# Patient Record
Sex: Male | Born: 1955 | Race: White | Hispanic: No | Marital: Married | Smoking: Never smoker
Health system: Southern US, Community
[De-identification: ages and names within clinical notes are randomized; demographics above are authoritative.]

## PROBLEM LIST (undated history)

## (undated) DIAGNOSIS — F419 Anxiety disorder, unspecified: Secondary | ICD-10-CM

## (undated) DIAGNOSIS — E7800 Pure hypercholesterolemia, unspecified: Secondary | ICD-10-CM

## (undated) DIAGNOSIS — I639 Cerebral infarction, unspecified: Secondary | ICD-10-CM

## (undated) DIAGNOSIS — G459 Transient cerebral ischemic attack, unspecified: Secondary | ICD-10-CM

---

## 1997-10-02 HISTORY — PX: VASECTOMY: SHX75

## 2007-01-23 ENCOUNTER — Ambulatory Visit: Payer: Self-pay | Admitting: Family Medicine

## 2008-01-01 ENCOUNTER — Ambulatory Visit: Payer: Self-pay | Admitting: Family Medicine

## 2008-01-01 DIAGNOSIS — J157 Pneumonia due to Mycoplasma pneumoniae: Secondary | ICD-10-CM

## 2008-01-01 DIAGNOSIS — E785 Hyperlipidemia, unspecified: Secondary | ICD-10-CM | POA: Insufficient documentation

## 2008-10-05 ENCOUNTER — Telehealth: Payer: Self-pay | Admitting: Family Medicine

## 2008-10-05 ENCOUNTER — Encounter: Payer: Self-pay | Admitting: Family Medicine

## 2008-10-05 DIAGNOSIS — R209 Unspecified disturbances of skin sensation: Secondary | ICD-10-CM

## 2008-10-06 ENCOUNTER — Ambulatory Visit: Payer: Self-pay | Admitting: Family Medicine

## 2008-10-07 ENCOUNTER — Inpatient Hospital Stay (HOSPITAL_COMMUNITY): Admission: AD | Admit: 2008-10-07 | Discharge: 2008-10-08 | Payer: Self-pay | Admitting: Neurology

## 2008-10-07 ENCOUNTER — Encounter (INDEPENDENT_AMBULATORY_CARE_PROVIDER_SITE_OTHER): Payer: Self-pay | Admitting: Neurology

## 2008-10-07 ENCOUNTER — Telehealth: Payer: Self-pay | Admitting: *Deleted

## 2008-10-07 ENCOUNTER — Telehealth: Payer: Self-pay | Admitting: Family Medicine

## 2008-10-29 ENCOUNTER — Ambulatory Visit: Payer: Self-pay | Admitting: Family Medicine

## 2008-10-29 LAB — CONVERTED CEMR LAB
ALT: 30 units/L (ref 0–53)
Albumin: 3.9 g/dL (ref 3.5–5.2)
Basophils Absolute: 0 10*3/uL (ref 0.0–0.1)
Basophils Relative: 0.2 % (ref 0.0–3.0)
Bilirubin Urine: NEGATIVE
Bilirubin, Direct: 0.1 mg/dL (ref 0.0–0.3)
Blood in Urine, dipstick: NEGATIVE
CRP, High Sensitivity: <1 — ABNORMAL LOW (ref 0.00–5.00)
Calcium: 8.9 mg/dL (ref 8.4–10.5)
Cholesterol: 154 mg/dL (ref 0–200)
Eosinophils Absolute: 0.1 10*3/uL (ref 0.0–0.7)
GFR calc non Af Amer: 84 mL/min
Glucose, Urine, Semiquant: NEGATIVE
HDL: 40.2 mg/dL (ref >39.0)
Ketones, urine, test strip: NEGATIVE
LDL Cholesterol: 92 mg/dL (ref 0–99)
MCHC: 34.2 g/dL (ref 30.0–36.0)
Monocytes Relative: 10.6 % (ref 3.0–12.0)
Neutro Abs: 3.7 10*3/uL (ref 1.4–7.7)
Neutrophils Relative %: 64.6 % (ref 43.0–77.0)
PSA: 0.6 ng/mL (ref 0.10–4.00)
Platelets: 196 10*3/uL (ref 150–400)
Protein, U semiquant: NEGATIVE
RBC: 4.35 M/uL (ref 4.22–5.81)
RDW: 12.5 % (ref 11.5–14.6)
Sodium: 140 meq/L (ref 135–145)
TSH: 1.52 microintl units/mL (ref 0.35–5.50)
Total Bilirubin: 0.7 mg/dL (ref 0.3–1.2)
Total Protein: 7 g/dL (ref 6.0–8.3)
Urobilinogen, UA: 0.2
VLDL: 22 mg/dL (ref 0–40)
WBC Urine, dipstick: NEGATIVE

## 2008-10-30 ENCOUNTER — Telehealth: Payer: Self-pay | Admitting: *Deleted

## 2008-11-09 ENCOUNTER — Ambulatory Visit: Payer: Self-pay | Admitting: Family Medicine

## 2008-11-19 ENCOUNTER — Ambulatory Visit: Payer: Self-pay | Admitting: Gastroenterology

## 2008-12-01 ENCOUNTER — Ambulatory Visit: Payer: Self-pay | Admitting: Gastroenterology

## 2009-05-31 ENCOUNTER — Telehealth: Payer: Self-pay | Admitting: Family Medicine

## 2009-08-05 ENCOUNTER — Ambulatory Visit: Payer: Self-pay | Admitting: Family Medicine

## 2009-08-06 ENCOUNTER — Telehealth: Payer: Self-pay | Admitting: Family Medicine

## 2009-08-06 ENCOUNTER — Ambulatory Visit: Payer: Self-pay | Admitting: Internal Medicine

## 2010-03-03 ENCOUNTER — Telehealth: Payer: Self-pay | Admitting: Family Medicine

## 2010-03-28 ENCOUNTER — Ambulatory Visit: Payer: Self-pay | Admitting: Family Medicine

## 2010-03-28 LAB — CONVERTED CEMR LAB
Alkaline Phosphatase: 40 units/L (ref 39–117)
Basophils Absolute: 0 10*3/uL (ref 0.0–0.1)
Basophils Relative: 0.5 % (ref 0.0–3.0)
Bilirubin Urine: NEGATIVE
CO2: 30 meq/L (ref 19–32)
Calcium: 8.7 mg/dL (ref 8.4–10.5)
Chloride: 106 meq/L (ref 96–112)
HCT: 41.1 % (ref 39.0–52.0)
Hemoglobin, Urine: NEGATIVE
Hemoglobin: 14.1 g/dL (ref 13.0–17.0)
MCV: 93.4 fL (ref 78.0–100.0)
Monocytes Absolute: 0.6 10*3/uL (ref 0.1–1.0)
Neutro Abs: 3.6 10*3/uL (ref 1.4–7.7)
Nitrite: NEGATIVE
PSA: 0.44 ng/mL (ref 0.10–4.00)
RBC: 4.39 M/uL (ref 4.22–5.81)
Sodium: 141 meq/L (ref 135–145)
TSH: 2.11 microintl units/mL (ref 0.35–5.50)
Total Bilirubin: 1 mg/dL (ref 0.3–1.2)
Total Protein: 6.6 g/dL (ref 6.0–8.3)
Triglycerides: 104 mg/dL (ref 0.0–149.0)
Urine Glucose: NEGATIVE mg/dL
Urobilinogen, UA: 0.2 (ref 0.0–1.0)
VLDL: 20.8 mg/dL (ref 0.0–40.0)
pH: 6 (ref 5.0–8.0)

## 2010-04-04 ENCOUNTER — Ambulatory Visit: Payer: Self-pay | Admitting: Family Medicine

## 2010-11-13 HISTORY — PX: OTHER SURGICAL HISTORY: SHX169

## 2010-12-13 NOTE — Assessment & Plan Note (Signed)
Summary: cpx//ccm/pt rsc/cjr   Vital Signs:  Patient profile:   55 year old male Height:      70.5 inches Weight:      204 pounds BMI:     28.96 BP sitting:   130 / 88  (left arm) Cuff size:   regular  Vitals Entered By: Kern Reap CMA Duncan Dull) (Apr 04, 2010 2:02 PM) CC: cpx Is Patient Diabetic? No Pain Assessment Patient in pain? no        CC:  cpx.  History of Present Illness: Charles Marquez is a 55 year old, married male, nonsmoker, who comes in today for physical evaluation because of underlying hyperlipidemia.  He takes Zocor 80 mg nightly lipids are dull L. states one aspirin tablet.  He takes Zyrtec p.r.n. for allergic rhinitis.  Review of systems negative.  He continues to remain very physically active.  He frefs , basketball and baseball games.  His oldest daughter [name] is getting married .  Allergies: No Known Drug Allergies  Past History:  Past medical, surgical, family and social histories (including risk factors) reviewed, and no changes noted (except as noted below).  Past Medical History: Reviewed history from 01/01/2008 and no changes required. Hyperlipidemia  Past Surgical History: Reviewed history from 01/01/2008 and no changes required. Inguinal herniorrhaphy-94' Vasectomy-96'  Family History: Reviewed history and no changes required.  Social History: Reviewed history from 01/01/2008 and no changes required. Occupation: [employer] Married Never Smoked Alcohol use-no Drug use-no Regular exercise-yes  Review of Systems      See HPI  Physical Exam  General:  Well-developed,well-nourished,in no acute distress; alert,appropriate and cooperative throughout examination Head:  Normocephalic and atraumatic without obvious abnormalities. No apparent alopecia or balding. Eyes:  No corneal or conjunctival inflammation noted. EOMI. Perrla. Funduscopic exam benign, without hemorrhages, exudates or papilledema. Vision grossly normal. Ears:  External ear  exam shows no significant lesions or deformities.  Otoscopic examination reveals clear canals, tympanic membranes are intact bilaterally without bulging, retraction, inflammation or discharge. Hearing is grossly normal bilaterally. Nose:  External nasal examination shows no deformity or inflammation. Nasal mucosa are pink and moist without lesions or exudates. Mouth:  Oral mucosa and oropharynx without lesions or exudates.  Teeth in good repair. Neck:  No deformities, masses, or tenderness noted. Chest Wall:  No deformities, masses, tenderness or gynecomastia noted. Breasts:  No masses or gynecomastia noted Lungs:  Normal respiratory effort, chest expands symmetrically. Lungs are clear to auscultation, no crackles or wheezes. Heart:  Normal rate and regular rhythm. S1 and S2 normal without gallop, murmur, click, rub or other extra sounds. Abdomen:  Bowel sounds positive,abdomen soft and non-tender without masses, organomegaly or hernias noted. Rectal:  No external abnormalities noted. Normal sphincter tone. No rectal masses or tenderness. Genitalia:  Testes bilaterally descended without nodularity, tenderness or masses. No scrotal masses or lesions. No penis lesions or urethral discharge. Prostate:  Prostate gland firm and smooth, no enlargement, nodularity, tenderness, mass, asymmetry or induration. Msk:  No deformity or scoliosis noted of thoracic or lumbar spine.   Pulses:  R and L carotid,radial,femoral,dorsalis pedis and posterior tibial pulses are full and equal bilaterally Extremities:  No clubbing, cyanosis, edema, or deformity noted with normal full range of motion of all joints.   Neurologic:  No cranial nerve deficits noted. Station and gait are normal. Plantar reflexes are down-going bilaterally. DTRs are symmetrical throughout. Sensory, motor and coordinative functions appear intact. Skin:  Intact without suspicious lesions or rashes Cervical Nodes:  No lymphadenopathy noted Axillary  Nodes:  No palpable lymphadenopathy Inguinal Nodes:  No significant adenopathy Psych:  Cognition and judgment appear intact. Alert and cooperative with normal attention span and concentration. No apparent delusions, illusions, hallucinations   Impression & Recommendations:  Problem # 1:  HYPERLIPIDEMIA (ICD-272.4) Assessment Unchanged  His updated medication list for this problem includes:    Zocor 80 Mg Tabs (Simvastatin) .[name]... 1 tab @ bedtime  Orders: Prescription Created Electronically [phone]) EKG w/ Interpretation (93000)  Problem # 2:  Preventive Health Care (ICD-V70.0) Assessment: Unchanged  Complete Medication List: 1)  Zyrtec Allergy 10 Mg Tabs (Cetirizine hcl) .... Otc as needed 2)  Zocor 80 Mg Tabs (Simvastatin) .[name].. 1 tab @ bedtime 3)  Aspirin 325 Mg Tabs (Aspirin) .... Once daily  Other Orders: Tdap => 43yrs IM (02725) Admin 1st Vaccine (36644)  Patient Instructions: 1)  Please schedule a follow-up appointment in 1 year. Prescriptions: ZOCOR 80 MG TABS (SIMVASTATIN) 1 tab @ bedtime  #100 x 3   Entered and Authorized by:   Roderick Pee MD   Signed by:   Roderick Pee MD on 04/04/2010   Method used:   [facility]  [place] [address] [facility]       [address]       [place], [place]  [address]       Ph: [phone] or [phone]       Fax: [phone]   RxID:   [id number]    Immunizations Administered:  Tetanus Vaccine:    Vaccine Type: Tdap    Site: left deltoid    Mfr: GlaxoSmithKline    Dose: 0.5 ml    Route: IM    Given by: Kern Reap CMA (AAMA)    Exp. Date: 02/05/2012    Lot #: [id number]    Physician counseled: yes

## 2010-12-13 NOTE — Progress Notes (Signed)
Summary: refill  Phone Note Call from Patient Call back at Home Phone [phone]   Caller: Patient---live call Reason for Call: Refill Medication Summary of Call: Cpx is on 03-24-2010. [facility] Initial call taken by: Warnell Forester,  March 03, 2010 9:07 AM    Prescriptions: ZOCOR 80 MG TABS (SIMVASTATIN) 1 tab @ bedtime  #90 x 0   Entered by:   Kern Reap CMA (AAMA)   Authorized by:   Roderick Pee MD   Signed by:   Kern Reap CMA (AAMA) on 03/03/2010   Method used:   [facility]  [place] [address]       [address]       [place], [place]  [address]       Ph: [phone] or [phone]       Fax: [phone]   RxID:   [id number]

## 2010-12-13 NOTE — Progress Notes (Signed)
Summary: pain worse CT or MRI?  Phone Note Call from Patient Call back at [phone]   Caller: live Call For: burchette Summary of Call: During evening & night pain got much worse.  Using the Ibuprofen.  You mentioned CT or MRI.  Calling to get this ordered. Initial call taken by: Rudy Jew, RN,  August 06, 2009 8:51 AM  Follow-up for Phone Call        Spoke with patient.  Pain progressive and kept him awake all night. Will set up abdominal CT study. Follow-up by: Evelena Peat MD,  August 06, 2009 9:43 AM  Additional Follow-up for Phone Call Additional follow up Details #1::        Appt Scheduled Pt aware Additional Follow-up by: Corky Mull,  August 06, 2009 5:04 PM    `

## 2011-01-12 HISTORY — PX: HERNIA REPAIR: SHX51

## 2011-03-28 NOTE — H&P (Signed)
NAMEDEJAY, KRONK NO.:  [id number]   MEDICAL RECORD NO.:  [id number]          PATIENT TYPE:  INP   LOCATION:  3034                         FACILITY:  [facility]   PHYSICIAN:  Melvyn Novas, M.D.  DATE OF BIRTH:  1956-10-10   DATE OF ADMISSION:  10/07/2008  DATE OF DISCHARGE:                              HISTORY & PHYSICAL   [name] is a patient of Dr. Tawanna Cooler at the [facility].  On Monday, October 05, 2008, he had a spell that was  reminiscent of a TIA.  Dr. Tawanna Cooler called my office, and we admitted the  patient directly to [facility] to allow a TIA workup prior to the  Thanksgiving holiday upcoming.   The patient states in his history of illness that he was feeling fine on  Monday at work.  By the time he ate lunch, he suddenly while eating  discovered that his left face was numb.  He states that he felt like his  left side of the tongue seemed to have gotten a shot of Novocain, then  he also realized as he walked about his office that his left fingers  were numbish.  He did not notice any other truncal numbness and the  lower extremities were not involved.  There was no weakness associated and no pain.  It took about 30 minutes  on to complete resolution of the symptoms.  By that time, the patient  drove himself to urgent care.  A CT unenhanced of the head was ordered  outpatient at [facility] and upon the results being normal,  he called Dr. Tawanna Cooler who then arranged for the patient to be seen by the  stroke service.   PAST MEDICAL HISTORY:  The patient received in the past Lipitor for  elevated cholesterol but has meanwhile stopped taking it.  He was also  advised to take aspirin but has irregularly taken it.  He was given  Avelox after the CT scan of the head was obtained for upper airway  symptoms.  He had a history of dry cough, nonproductive and some dull  headaches; however, there was no evidence of sinusitis on the CT  scan  and the Avelox was discontinued by Dr. Tawanna Cooler.  The patient has no history of hypertension or diabetes mellitus, no  history of seizures, previous strokes, or traumatic brain injury.   CURRENT MEDICATIONS:  Only baby aspirin, which he has taken irregularly.  No nutritional supplements.  He had once tried niacin but is no longer  on it, and he states he was just started again on simvastatin by Dr.  Tawanna Cooler, has not yet filled this prescription.   He has no known drug allergies.   Family history is negative for coronary artery disease, hypertension,  diabetes, stroke, and dementia.  His father died of lung cancer but was  a heavy smoker.  His mother is 67, generally in good health but has a  mild-to-moderate degree of COPD and is also an ex-smoker.   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  He is married, with two adult children, age 41 at 80.  The  older daughter is just visiting from [place] tomorrow for the  Thanksgiving holiday.   Vital signs are stable.  The patient is febrile.  Blood pressure has  been 117/80, heart rate is between 75 and 83, and respiratory rate is  about 18.  At rest lungs are clear to auscultation.  No wheezing.  No  rhonchi.  The patient oxygenated on room air 95%.  His kilogram weight  is 92.  His oral intake and his output have been balanced per the  nursing note.  Mental status is alert and oriented x3, he has fluent and full phase  eloquent, no trouble with swallowing, passed the bedside swallow test  and the stroke panel already.  Cranial nerve examination shows no  residual facial numbness, no tongue or uvula deviation, and no  disconjugate gait.  The patient has no pupillary or facial asymmetry.  Motor examination shows equal tone, mass, and strength.  There is no pronator drift.  Symmetric reflexes are seen with downgoing  toes to  plantar stimulation.  Finger-nose-finger test is intact.  Gait is intact.  Sensory  subjectively as reported by the patient.  No residual numbness.  There is no extinction.   ASSESSMENT:  I believe that this patient has likely a transient ischemic  attack of 20-30 minutes duration with a right brain origin given the  left-sided symptoms.  This could well be located to the internal  capsule.  I am expecting a rather small area to be involved and since symptoms  have resolved a MRI may be negative.  There has been no general change  in his personality or level of alertness and no bulbar symptoms,  strictly sensory symptoms.    The patient was admitted for observation.  An MRI, 2-D echo, and  carotid Dopplers are ordered.  We will also draw and hypercoagulability  panel and a fasting lipid panel on October 08, 2008, in a.m. since the  patient had already eaten prior to his observational stay admission and  he will be taking aspirin 325 mg for the next 6 weeks then may return to  a baby aspirin if taken daily.   Followup can be arranged with Dr. Tawanna Cooler should any of the tests return  abnormal.  A followup will be arranged with Dr. Delia Heady.      Melvyn Novas, M.D.  Electronically Signed     CD/MEDQ  D:  10/08/2008  T:  10/08/2008  Job:  [id number]   cc:   Tinnie Gens A. Tawanna Cooler, MD  Pramod P. Pearlean Brownie, MD

## 2011-06-28 ENCOUNTER — Other Ambulatory Visit: Payer: Self-pay | Admitting: Family Medicine

## 2011-08-15 LAB — CBC
MCHC: 34 g/dL (ref 30.0–36.0)
Platelets: 219 10*3/uL (ref 150–400)

## 2011-08-15 LAB — COMPREHENSIVE METABOLIC PANEL
AST: 25 U/L (ref 0–37)
Albumin: 3.8 g/dL (ref 3.5–5.2)
BUN: 10 mg/dL (ref 6–23)
CO2: 26 mEq/L (ref 19–32)
GFR calc non Af Amer: >60 mL/min (ref >60)
Glucose, Bld: 97 mg/dL (ref 70–99)
Potassium: 3.8 mEq/L (ref 3.5–5.1)
Sodium: 139 mEq/L (ref 135–145)
Total Protein: 6.6 g/dL (ref 6.0–8.3)

## 2011-08-15 LAB — CARDIOLIPIN ANTIBODIES, IGG, IGM, IGA: Anticardiolipin IgG: <7 [GPL'U] — ABNORMAL LOW (ref <11)

## 2011-08-15 LAB — URINALYSIS, ROUTINE W REFLEX MICROSCOPIC
Bilirubin Urine: NEGATIVE
Glucose, UA: NEGATIVE mg/dL
Protein, ur: NEGATIVE mg/dL
Specific Gravity, Urine: 1.011 (ref 1.005–1.030)
Urobilinogen, UA: 0.2 mg/dL (ref 0.0–1.0)
pH: 7 (ref 5.0–8.0)

## 2011-08-15 LAB — LUPUS ANTICOAGULANT PANEL: PTT Lupus Anticoagulant: 43 secs (ref 36.3–48.8)

## 2011-08-15 LAB — URINE MICROSCOPIC-ADD ON

## 2011-08-15 LAB — FACTOR 5 LEIDEN

## 2011-08-15 LAB — HOMOCYSTEINE: Homocysteine: 9 umol/L (ref 4.0–15.4)

## 2011-08-15 LAB — PROTEIN S ACTIVITY: Protein S Activity: 128 % (ref 69–129)

## 2011-08-15 LAB — PROTHROMBIN GENE MUTATION

## 2011-08-15 LAB — HEMOGLOBIN A1C
Hgb A1c MFr Bld: 5.6 % (ref 4.6–6.1)
Mean Plasma Glucose: 114 mg/dL

## 2011-08-15 LAB — LIPID PANEL
Cholesterol: 216 mg/dL — ABNORMAL HIGH (ref 0–200)
VLDL: 72 mg/dL — ABNORMAL HIGH (ref 0–40)

## 2011-08-15 LAB — GLUCOSE, CAPILLARY: Glucose-Capillary: 146 mg/dL — ABNORMAL HIGH (ref 70–99)

## 2011-08-15 LAB — PROTEIN C ACTIVITY: Protein C Activity: 118 % (ref 75–133)

## 2011-08-15 LAB — ANTITHROMBIN III: AntiThromb III Func: 111 % (ref 76–126)

## 2012-04-08 ENCOUNTER — Other Ambulatory Visit: Payer: Self-pay | Admitting: Family Medicine

## 2012-06-09 ENCOUNTER — Other Ambulatory Visit: Payer: Self-pay | Admitting: Family Medicine

## 2012-07-12 ENCOUNTER — Other Ambulatory Visit: Payer: Self-pay | Admitting: Family Medicine

## 2012-07-19 ENCOUNTER — Telehealth: Payer: Self-pay | Admitting: Family Medicine

## 2012-07-19 MED ORDER — SIMVASTATIN 80 MG PO TABS: 80.0000 mg | ORAL_TABLET | Freq: Every day | ORAL | Status: DC

## 2012-07-19 NOTE — Telephone Encounter (Signed)
Pt has sch ov for 07/23/12 at 9:15am for med check ov with Dr Tawanna Cooler. Pt req partial refill of simvastatin (ZOCOR) 80 MG tablet to last until appt date. [facility] [place]

## 2012-07-23 ENCOUNTER — Ambulatory Visit (INDEPENDENT_AMBULATORY_CARE_PROVIDER_SITE_OTHER): Payer: Self-pay | Admitting: Family Medicine

## 2012-07-23 ENCOUNTER — Encounter: Payer: Self-pay | Admitting: Family Medicine

## 2012-07-23 VITALS — BP 120/88 | Temp 98.4°F | Ht 71.0 in | Wt 209.0 lb

## 2012-07-23 DIAGNOSIS — Z0000 Encounter for general adult medical examination without abnormal findings: Secondary | ICD-10-CM | POA: Insufficient documentation

## 2012-07-23 DIAGNOSIS — Z79899 Other long term (current) drug therapy: Secondary | ICD-10-CM

## 2012-07-23 DIAGNOSIS — E785 Hyperlipidemia, unspecified: Secondary | ICD-10-CM

## 2012-07-23 DIAGNOSIS — Z23 Encounter for immunization: Secondary | ICD-10-CM

## 2012-07-23 LAB — BASIC METABOLIC PANEL
BUN: 12 mg/dL (ref 6–23)
Calcium: 9 mg/dL (ref 8.4–10.5)
GFR: 98.29 mL/min (ref >60.00)
Potassium: 4.2 mEq/L (ref 3.5–5.1)

## 2012-07-23 LAB — POCT URINALYSIS DIPSTICK
Bilirubin, UA: NEGATIVE
Glucose, UA: NEGATIVE
Leukocytes, UA: NEGATIVE
Nitrite, UA: NEGATIVE
Urobilinogen, UA: 0.2
pH, UA: 7

## 2012-07-23 LAB — CBC WITH DIFFERENTIAL/PLATELET
Basophils Absolute: 0 10*3/uL (ref 0.0–0.1)
Eosinophils Absolute: 0.1 10*3/uL (ref 0.0–0.7)
Lymphocytes Relative: 21 % (ref 12.0–46.0)
Lymphs Abs: 1.2 10*3/uL (ref 0.7–4.0)
Monocytes Relative: 11.2 % (ref 3.0–12.0)
Platelets: 201 10*3/uL (ref 150.0–400.0)
RDW: 13 % (ref 11.5–14.6)

## 2012-07-23 LAB — PSA: PSA: 1.02 ng/mL (ref 0.10–4.00)

## 2012-07-23 LAB — HEPATIC FUNCTION PANEL
ALT: 23 U/L (ref 0–53)
AST: 25 U/L (ref 0–37)
Albumin: 4.1 g/dL (ref 3.5–5.2)

## 2012-07-23 LAB — LIPID PANEL
Cholesterol: 207 mg/dL — ABNORMAL HIGH (ref 0–200)
Triglycerides: 241 mg/dL — ABNORMAL HIGH (ref 0.0–149.0)

## 2012-07-23 LAB — LDL CHOLESTEROL, DIRECT: Direct LDL: 106.2 mg/dL

## 2012-07-23 MED ORDER — SIMVASTATIN 80 MG PO TABS: 80.0000 mg | ORAL_TABLET | Freq: Every day | ORAL | Status: DC

## 2012-07-23 NOTE — Patient Instructions (Signed)
Continue good health habits  Take the Zocor and aspirin daily  Return in one year for general physical examination,,,,,,,,,,, sooner if any problems

## 2012-07-23 NOTE — Progress Notes (Signed)
  Subjective:    Patient ID: Charles Marquez, male    DOB: 07/06/1956, 56 y.o.   MRN: [id number]  HPI Charles Marquez is a 56 year old married male nonsmoker who comes in today for general physical examination  He takes an aspirin tablet +80 mg of Zocor daily for hyperlipidemia will check lipid panel  He gets routine eye care, dental care, colonoscopy 2010 normal, tetanus 2011   Review of Systems  Constitutional: Negative.   HENT: Negative.   Eyes: Negative.   Respiratory: Negative.   Cardiovascular: Negative.   Gastrointestinal: Negative.   Genitourinary: Negative.   Musculoskeletal: Negative.   Skin: Negative.   Neurological: Negative.   Hematological: Negative.   Psychiatric/Behavioral: Negative.        Objective:   Physical Exam  Constitutional: He is oriented to person, place, and time. He appears well-developed and well-nourished.  HENT:  Head: Normocephalic and atraumatic.  Right Ear: External ear normal.  Left Ear: External ear normal.  Nose: Nose normal.  Mouth/Throat: Oropharynx is clear and moist.  Eyes: Conjunctivae and EOM are normal. Pupils are equal, round, and reactive to light.  Neck: Normal range of motion. Neck supple. No JVD present. No tracheal deviation present. No thyromegaly present.  Cardiovascular: Normal rate, regular rhythm, normal heart sounds and intact distal pulses.  Exam reveals no gallop and no friction rub.   No murmur heard. Pulmonary/Chest: Effort normal and breath sounds normal. No stridor. No respiratory distress. He has no wheezes. He has no rales. He exhibits no tenderness.  Abdominal: Soft. Bowel sounds are normal. He exhibits no distension and no mass. There is no tenderness. There is no rebound and no guarding.  Genitourinary: Rectum normal, prostate normal and penis normal. Guaiac negative stool. No penile tenderness.  Musculoskeletal: Normal range of motion. He exhibits no edema and no tenderness.  Lymphadenopathy:    He has no cervical  adenopathy.  Neurological: He is alert and oriented to person, place, and time. He has normal reflexes. No cranial nerve deficit. He exhibits normal muscle tone.  Skin: Skin is warm and dry. No rash noted. No erythema. No pallor.  Psychiatric: He has a normal mood and affect. His behavior is normal. Judgment and thought content normal.          Assessment & Plan:  Healthy male  Hyperlipidemia check labs

## 2012-07-23 NOTE — Addendum Note (Signed)
Addended by: Kern Reap B on: 07/23/2012 01:57 PM   Modules accepted: Orders

## 2012-07-29 ENCOUNTER — Encounter: Payer: Self-pay | Admitting: *Deleted

## 2012-09-06 ENCOUNTER — Ambulatory Visit (INDEPENDENT_AMBULATORY_CARE_PROVIDER_SITE_OTHER): Payer: 59 | Admitting: *Deleted

## 2012-09-06 DIAGNOSIS — Z23 Encounter for immunization: Secondary | ICD-10-CM

## 2012-09-06 DIAGNOSIS — Z2911 Encounter for prophylactic immunotherapy for respiratory syncytial virus (RSV): Secondary | ICD-10-CM

## 2012-09-06 DIAGNOSIS — Z0000 Encounter for general adult medical examination without abnormal findings: Secondary | ICD-10-CM

## 2013-04-15 ENCOUNTER — Ambulatory Visit (INDEPENDENT_AMBULATORY_CARE_PROVIDER_SITE_OTHER): Payer: 59 | Admitting: Family Medicine

## 2013-04-15 ENCOUNTER — Encounter: Payer: Self-pay | Admitting: Family Medicine

## 2013-04-15 VITALS — BP 110/80 | Temp 97.7°F | Wt 224.0 lb

## 2013-04-15 DIAGNOSIS — G8929 Other chronic pain: Secondary | ICD-10-CM

## 2013-04-15 DIAGNOSIS — M5137 Other intervertebral disc degeneration, lumbosacral region: Secondary | ICD-10-CM

## 2013-04-15 DIAGNOSIS — R1031 Right lower quadrant pain: Secondary | ICD-10-CM

## 2013-04-15 DIAGNOSIS — M545 Low back pain: Secondary | ICD-10-CM

## 2013-04-15 DIAGNOSIS — M5136 Other intervertebral disc degeneration, lumbar region: Secondary | ICD-10-CM | POA: Insufficient documentation

## 2013-04-15 LAB — POCT URINALYSIS DIPSTICK
Blood, UA: 6.5
Glucose, UA: NEGATIVE
Nitrite, UA: NEGATIVE
Protein, UA: NEGATIVE
Spec Grav, UA: 1.01
Urobilinogen, UA: 0.2
pH, UA: 6.5

## 2013-04-15 MED ORDER — TRAMADOL HCL 50 MG PO TABS: ORAL_TABLET | ORAL | Status: DC

## 2013-04-15 NOTE — Progress Notes (Signed)
  Subjective:    Patient ID: Charles Marquez, male    DOB: 03/20/1956, 57 y.o.   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 He got home went to get out of his truck and noticed some right-sided low back pain. It's been persistent since that time although last night he slept better than the previous nights. He describes the pain as fairly constant a 4-5 on a scale of 1-10. Its increased by sitting and decreased by rest. He's had a history of bulging discs in the past. No previous history of kidney stones  No fever chills nausea vomiting or diarrhea or change in urinary habits   Review of Systems    review of systems otherwise negative Objective:   Physical Exam  Well-developed well-nourished male in no acute distress examinations spine is normal in the supine position his legs were of equal length. Sensation muscle strength reflexes are within normal limits. Straight leg raising negative. Urinalysis normal      Assessment & Plan:  Lumbar disc disease plan treat with Motrin exercise tramadol each bedtime when necessary return when necessary

## 2013-04-15 NOTE — Patient Instructions (Signed)
Motrin 600 mg twice daily with food  Avoid sitting  Daily stretching exercises in the morning  Tramadol 50 mg,,,,,,,,,,,, 1 at bedtime when necessary for back pain  Return when necessary

## 2013-04-18 ENCOUNTER — Encounter: Payer: Self-pay | Admitting: Family Medicine

## 2013-04-18 ENCOUNTER — Telehealth: Payer: Self-pay | Admitting: Family Medicine

## 2013-04-18 NOTE — Telephone Encounter (Signed)
Dr Todd spoke with patient 

## 2013-04-18 NOTE — Telephone Encounter (Signed)
Patient Information:  Caller Name: [name]  Phone: [phone]  Patient: Charles Marquez, Charles Marquez  Gender: Male  DOB: 09-Aug-1956  Age: 57 Years  PCP: [facility])  Office Follow Up:  Does the office need to follow up with this patient?: Yes  Instructions For The Office: Please call back regarding work in appointment 04/18/13.  RN Note:  Noted pain in right testicle for 1 hour on 04/14/13; testicular pain has not recurred since then.  Denies groin or testicular swelling or pain in the scrotum.   Denies urinary symptoms.   Last BM 04/18/13. RN advised to see MD today due to ongoing mild groin pain.  No appointments remain with any provider.  Message sent to office staff for call back regarding work in appointment.    Symptoms  Reason For Call & Symptoms: Ongoing mild right groin pain rated 2-3/10.  Seen 04/15/13 for low right back pain with groin pain. Back remains stiff; pain nearly resolved.  Taking Tramadol hs.  Reviewed Health History In EMR: Yes  Reviewed Medications In EMR: Yes  Reviewed Allergies In EMR: Yes  Reviewed Surgeries / Procedures: Yes  Date of Onset of Symptoms: 04/13/2013  Treatments Tried: Tramadol hs, Naproxen prn  Treatments Tried Worked: Yes  Guideline(s) Used:  Abdominal Pain - Male  Disposition Per Guideline:   See Today in Office  Reason For Disposition Reached:   Patient wants to be seen  Advice Given:  Call Back If:  Abdominal pain is constant and present for more than 2 hours  Abdominal pains come and go and are present for more than 24 hours  You become worse.  Patient Will Follow Care Advice:  YES

## 2013-06-05 ENCOUNTER — Telehealth: Payer: Self-pay | Admitting: Family Medicine

## 2013-06-05 NOTE — Telephone Encounter (Signed)
Pr would like a call from you. No other message.

## 2013-06-06 NOTE — Telephone Encounter (Signed)
Left message on machine for patient returning his call. 

## 2013-06-09 MED ORDER — EPINEPHRINE 0.3 MG/0.3ML IJ SOAJ: 0.3000 mg | Freq: Once | INTRAMUSCULAR | Status: DC

## 2013-06-09 MED ORDER — PREDNISONE 20 MG PO TABS: 20.0000 mg | ORAL_TABLET | Freq: Every day | ORAL | Status: DC

## 2013-06-09 NOTE — Telephone Encounter (Signed)
Patient had a reaction to bee stings.  He had hives and some redness.  He would like to know if he can have prednisone on hand in case it happens again.

## 2013-06-09 NOTE — Telephone Encounter (Signed)
Per Dr Tawanna Cooler okay to send in prednisone, epi pen, and referral to Dr Willa Rough.  Patient does not feel the need for Dr Willa Rough at this time

## 2013-08-12 ENCOUNTER — Other Ambulatory Visit: Payer: Self-pay | Admitting: Family Medicine

## 2013-09-18 ENCOUNTER — Other Ambulatory Visit: Payer: Self-pay

## 2013-12-21 ENCOUNTER — Other Ambulatory Visit: Payer: Self-pay | Admitting: Family Medicine

## 2014-07-19 ENCOUNTER — Other Ambulatory Visit: Payer: Self-pay | Admitting: Family Medicine

## 2014-10-19 ENCOUNTER — Emergency Department (HOSPITAL_COMMUNITY): Payer: 59

## 2014-10-19 ENCOUNTER — Observation Stay (HOSPITAL_COMMUNITY)
Admission: EM | Admit: 2014-10-19 | Discharge: 2014-10-20 | Disposition: A | Discharge status: Home / Self Care | Payer: 59 | Attending: Internal Medicine | Admitting: Internal Medicine

## 2014-10-19 ENCOUNTER — Encounter (HOSPITAL_COMMUNITY): Payer: Self-pay | Admitting: *Deleted

## 2014-10-19 DIAGNOSIS — G919 Hydrocephalus, unspecified: Secondary | ICD-10-CM | POA: Insufficient documentation

## 2014-10-19 DIAGNOSIS — R200 Anesthesia of skin: Secondary | ICD-10-CM

## 2014-10-19 DIAGNOSIS — R208 Other disturbances of skin sensation: Secondary | ICD-10-CM | POA: Diagnosis present

## 2014-10-19 DIAGNOSIS — E785 Hyperlipidemia, unspecified: Secondary | ICD-10-CM

## 2014-10-19 DIAGNOSIS — G9389 Other specified disorders of brain: Secondary | ICD-10-CM

## 2014-10-19 DIAGNOSIS — G459 Transient cerebral ischemic attack, unspecified: Secondary | ICD-10-CM | POA: Diagnosis not present

## 2014-10-19 DIAGNOSIS — I639 Cerebral infarction, unspecified: Secondary | ICD-10-CM

## 2014-10-19 HISTORY — DX: Pure hypercholesterolemia, unspecified: E78.00

## 2014-10-19 HISTORY — DX: Transient cerebral ischemic attack, unspecified: G45.9

## 2014-10-19 LAB — CBC
HCT: 38.3 % — ABNORMAL LOW (ref 39.0–52.0)
Hemoglobin: 12.7 g/dL — ABNORMAL LOW (ref 13.0–17.0)
MCH: 30.2 pg (ref 26.0–34.0)
MCHC: 33.2 g/dL (ref 30.0–36.0)
MCV: 91 fL (ref 78.0–100.0)
PLATELETS: 164 10*3/uL (ref 150–400)
RBC: 4.21 MIL/uL — AB (ref 4.22–5.81)
RDW: 13 % (ref 11.5–15.5)
WBC: 5.7 10*3/uL (ref 4.0–10.5)

## 2014-10-19 LAB — BASIC METABOLIC PANEL
Anion gap: 13 (ref 5–15)
BUN: 13 mg/dL (ref 6–23)
CHLORIDE: 104 meq/L (ref 96–112)
CO2: 23 mEq/L (ref 19–32)
Calcium: 8.8 mg/dL (ref 8.4–10.5)
Creatinine, Ser: 0.77 mg/dL (ref 0.50–1.35)
GFR calc non Af Amer: >90 mL/min (ref >90)
Glucose, Bld: 98 mg/dL (ref 70–99)
POTASSIUM: 4.1 meq/L (ref 3.7–5.3)
Sodium: 140 mEq/L (ref 137–147)

## 2014-10-19 LAB — I-STAT TROPONIN, ED: TROPONIN I, POC: 0 ng/mL (ref 0.00–0.08)

## 2014-10-19 NOTE — ED Notes (Signed)
Neuro Hospitalist at the bedside 

## 2014-10-19 NOTE — ED Provider Notes (Signed)
CSN: [id number]     Arrival date & time 10/19/14  2026 History   First MD Initiated Contact with Patient 10/19/14 2041     Chief Complaint  Patient presents with  . Numbness     (Consider location/radiation/quality/duration/timing/severity/associated sxs/prior Treatment) HPI Comments: R facial and hand tingling, lasting about 15-20 minutes. Self-limited. Patient had TIA previously in 2009 with similar symptoms on the left side.  Patient is a 58 y.o. male presenting with neurologic complaint. The history is provided by the patient.  Neurologic Problem This is a new problem. The current episode started 1 to 2 hours ago. The problem occurs constantly. The problem has been resolved. Pertinent negatives include no chest pain, no abdominal pain and no shortness of breath. Nothing aggravates the symptoms. Nothing relieves the symptoms. He has tried nothing for the symptoms.    Past Medical History  Diagnosis Date  . TIA (transient ischemic attack)   . Hypercholesteremia    Past Surgical History  Procedure Laterality Date  . Hernia repair    . Fractured arm     No family history on file. History  Substance Use Topics  . Smoking status: Never Smoker   . Smokeless tobacco: Never Used  . Alcohol Use: Yes     Comment: ocassionally    Review of Systems  Constitutional: Negative for fever.  Respiratory: Negative for cough and shortness of breath.   Cardiovascular: Negative for chest pain and leg swelling.  Gastrointestinal: Negative for vomiting and abdominal pain.  All other systems reviewed and are negative.     Allergies  Review of patient's allergies indicates no known allergies.  Home Medications   Prior to Admission medications   Medication Sig Start Date End Date Taking? Authorizing Provider  EPINEPHrine (EPI-PEN) 0.3 mg/0.3 mL SOAJ Inject 0.3 mLs (0.3 mg total) into the muscle once. 06/09/13   Roderick PeeJeffrey A Todd, MD  predniSONE (DELTASONE) 20 MG tablet Take 1 tablet (20 mg  total) by mouth daily. 06/09/13   Roderick PeeJeffrey A Todd, MD  simvastatin (ZOCOR) 80 MG tablet TAKE 1 TABLET BY MOUTH AT BEDTIME 07/21/14   Roderick PeeJeffrey A Todd, MD  traMADol Janean Sark(ULTRAM) 50 MG tablet One tablet each bedtime when necessary for back pain 04/15/13   Roderick PeeJeffrey A Todd, MD   BP 145/93 mmHg  Pulse 67  Temp(Src) 98.2 F (36.8 C) (Oral)  Resp 18  Ht 5\' 11"  (1.803 m)  Wt 210 lb 4 oz (95.369 kg)  BMI 29.34 kg/m2  SpO2 97% Physical Exam  Constitutional: He is oriented to person, place, and time. He appears well-developed and well-nourished. No distress.  HENT:  Head: Normocephalic and atraumatic.  Mouth/Throat: Oropharynx is clear and moist. No oropharyngeal exudate.  Eyes: EOM are normal. Pupils are equal, round, and reactive to light.  Neck: Normal range of motion. Neck supple.  Cardiovascular: Normal rate and regular rhythm.  Exam reveals no friction rub.   No murmur heard. Pulmonary/Chest: Effort normal and breath sounds normal. No respiratory distress. He has no wheezes. He has no rales.  Abdominal: Soft. He exhibits no distension. There is no tenderness. There is no rebound.  Musculoskeletal: Normal range of motion. He exhibits no edema.  Neurological: He is alert and oriented to person, place, and time. No cranial nerve deficit. He exhibits normal muscle tone. Coordination normal.  Skin: No rash noted. He is not diaphoretic.  Nursing note and vitals reviewed.   ED Course  Procedures (including critical care time) Labs Review Labs Reviewed  CBC  BASIC METABOLIC PANEL  URINE RAPID DRUG SCREEN (HOSP PERFORMED)  I-STAT TROPOININ, ED    Imaging Review [name] Contrast  10/19/2014   CLINICAL DATA:  Visual changes beginning at 630 this evening, 1 hour later, numbness and tingling in RIGHT arm with " funny " taste in mouth.  EXAM: MRI HEAD WITHOUT CONTRAST  TECHNIQUE: Multiplanar, multiecho pulse sequences of the brain and surrounding structures were obtained without intravenous contrast.   COMPARISON:  MRI of the brain October 07, 2008  FINDINGS: No reduced diffusion to suggest acute ischemia. No susceptibility artifact to suggest hemorrhage. Punctate foci of susceptibility artifact at the supra cerebellar cistern favor vessels.  Moderate ventriculomegaly, advanced from prior imaging. Confluent periventricular/transependymal T2 bright signal, new from prior imaging. No midline shift. No abnormal extra-axial fluid collections. Normal major intracranial vascular flow voids seen at the skull base.  No paranasal sinus air-fluid levels. The mastoid air cells are well aerated. Ocular globes and orbital contents are nonsuspicious though not tailored for evaluation. No abnormal sellar expansion. No cerebellar tonsillar ectopia. No suspicious calvarial bone marrow signal.  IMPRESSION: Moderate ventriculomegaly, advanced from prior imaging, in addition to new confluent periventricular white matter T2 hyperintensities. Though this may reflect a component of chronic small vessel ischemic disease, findings are concerning for superimposed acute hydrocephalus.  No acute ischemia.  Acute findings discussed with and reconfirmed by Theressa Stampsr.Selmer Adduci on 10/19/2014 at 10:44 pm.   Electronically Signed   By: Awilda Metroourtnay  Bloomer   On: 10/19/2014 22:44     EKG Interpretation None       Date: 10/20/2014  Rate: 65  Rhythm: normal sinus rhythm  QRS Axis: normal  Intervals: normal  ST/T Wave abnormalities: normal  Conduction Disutrbances:none  Narrative Interpretation:   Old EKG Reviewed: unchanged    MDM   Final diagnoses:  Right facial numbness  Transient cerebral ischemia, unspecified transient cerebral ischemia type    77M presents with right-sided facial numbness. Last about 15 minutes. Also had right hand numbness. Preceding that he had a small floater in his right eye. Similar to T8 he had 6 years ago, however symptoms at that time on the left side. He also had some difficulty with speech but no  true aphasia, just more labored speech. All symptoms resolved at this time. Neuro exam nonfocal here. Concern for new TIA. Plan for MRI, admission. MR shows concern for acute hydrocephalus. Dr. Amada JupiterKirkpatrick aware, evaluated patient, doesn't feel he needs emergent LP or Neurosurgical intervention. Patient admitted.   Elwin MochaBlair Sayre Witherington, MD 10/20/14 [phone]

## 2014-10-19 NOTE — ED Notes (Signed)
Patient states about 630 he noticed a "floater" no other problems.  About 730 noticed tingling and numbness in his right arm and a funny taste in his mouth.  Wife stated that his speech was more labored then usual/  At this time no issues noted

## 2014-10-19 NOTE — ED Notes (Signed)
Patient transported to MRI 

## 2014-10-20 ENCOUNTER — Observation Stay (HOSPITAL_COMMUNITY): Payer: 59

## 2014-10-20 ENCOUNTER — Encounter (HOSPITAL_COMMUNITY): Payer: Self-pay | Admitting: *Deleted

## 2014-10-20 DIAGNOSIS — I639 Cerebral infarction, unspecified: Secondary | ICD-10-CM

## 2014-10-20 DIAGNOSIS — G459 Transient cerebral ischemic attack, unspecified: Secondary | ICD-10-CM

## 2014-10-20 DIAGNOSIS — E785 Hyperlipidemia, unspecified: Secondary | ICD-10-CM

## 2014-10-20 DIAGNOSIS — I369 Nonrheumatic tricuspid valve disorder, unspecified: Secondary | ICD-10-CM

## 2014-10-20 LAB — LIPID PANEL
CHOL/HDL RATIO: 3.4 ratio
CHOLESTEROL: 144 mg/dL (ref 0–200)
HDL: 42 mg/dL (ref >39)
LDL Cholesterol: 83 mg/dL (ref 0–99)
Triglycerides: 97 mg/dL (ref <150)
VLDL: 19 mg/dL (ref 0–40)

## 2014-10-20 LAB — RAPID URINE DRUG SCREEN, HOSP PERFORMED
Amphetamines: NOT DETECTED
Barbiturates: NOT DETECTED
Benzodiazepines: NOT DETECTED
Cocaine: NOT DETECTED
Opiates: NOT DETECTED
TETRAHYDROCANNABINOL: NOT DETECTED

## 2014-10-20 LAB — GLUCOSE, CAPILLARY: Glucose-Capillary: 85 mg/dL (ref 70–99)

## 2014-10-20 LAB — HEMOGLOBIN A1C
Hgb A1c MFr Bld: 5.5 % (ref <5.7)
MEAN PLASMA GLUCOSE: 111 mg/dL (ref <117)

## 2014-10-20 MED ORDER — TRAMADOL HCL 50 MG PO TABS: 50.0000 mg | ORAL_TABLET | Freq: Four times a day (QID) | ORAL | Status: DC | PRN

## 2014-10-20 MED ORDER — ACETAMINOPHEN 325 MG PO TABS: 650.0000 mg | ORAL_TABLET | ORAL | Status: DC | PRN

## 2014-10-20 MED ORDER — SODIUM CHLORIDE 0.9 % IV SOLN
INTRAVENOUS | Status: DC
  Administered 2014-10-20: 01:00:00 via INTRAVENOUS

## 2014-10-20 MED ORDER — ATORVASTATIN CALCIUM 40 MG PO TABS: 40.0000 mg | ORAL_TABLET | Freq: Every day | ORAL | Status: DC

## 2014-10-20 MED ORDER — INFLUENZA VAC SPLIT QUAD 0.5 ML IM SUSY: 0.5000 mL | PREFILLED_SYRINGE | INTRAMUSCULAR | Status: DC

## 2014-10-20 MED ORDER — HEPARIN SODIUM (PORCINE) 5000 UNIT/ML IJ SOLN
5000.0000 [IU] | Freq: Three times a day (TID) | INTRAMUSCULAR | Status: DC
  Administered 2014-10-20 (×2): 5000 [IU] via SUBCUTANEOUS
  Filled 2014-10-20 (×2): qty 1

## 2014-10-20 MED ORDER — ASPIRIN 325 MG PO TABS
325.0000 mg | ORAL_TABLET | Freq: Every day | ORAL | Status: DC
  Administered 2014-10-20: 325 mg via ORAL
  Filled 2014-10-20: qty 1

## 2014-10-20 MED ORDER — CLOPIDOGREL BISULFATE 75 MG PO TABS: 75.0000 mg | ORAL_TABLET | Freq: Every day | ORAL | Status: DC

## 2014-10-20 MED ORDER — STROKE: EARLY STAGES OF RECOVERY BOOK
Freq: Once | Status: DC
  Filled 2014-10-20 (×2): qty 1

## 2014-10-20 NOTE — Discharge Instructions (Signed)
Transient Ischemic Attack  A transient ischemic attack (TIA) is a "warning stroke" that causes stroke-like symptoms. Unlike a stroke, a TIA does not cause permanent damage to the brain. The symptoms of a TIA can happen very fast and do not last long. It is important to know the symptoms of a TIA and what to do. This can help prevent a major stroke or death.  CAUSES   · A TIA is caused by a temporary blockage in an artery in the brain or neck (carotid artery). The blockage does not allow the brain to get the blood supply it needs and can cause different symptoms. The blockage can be caused by either:  ¨ A blood clot.  ¨ Fatty buildup (plaque) in a neck or brain artery.  RISK FACTORS  · High blood pressure (hypertension).  · High cholesterol.  · Diabetes mellitus.  · Heart disease.  · The build up of plaque in the blood vessels (peripheral artery disease or atherosclerosis).  · The build up of plaque in the blood vessels providing blood and oxygen to the brain (carotid artery stenosis).  · An abnormal heart rhythm (atrial fibrillation).  · Obesity.  · Smoking.  · Taking oral contraceptives (especially in combination with smoking).  · Physical inactivity.  · A diet high in fats, salt (sodium), and calories.  · Alcohol use.  · Use of illegal drugs (especially cocaine and methamphetamine).  · Being male.  · Being African American.  · Being over the age of 55.  · Family history of stroke.  · Previous history of blood clots, stroke, TIA, or heart attack.  · Sickle cell disease.  SYMPTOMS   TIA symptoms are the same as a stroke but are temporary. These symptoms usually develop suddenly, or may be newly present upon awakening from sleep:  · Sudden weakness or numbness of the face, arm, or leg, especially on one side of the body.  · Sudden trouble walking or difficulty moving arms or legs.  · Sudden confusion.  · Sudden personality changes.  · Trouble speaking (aphasia) or understanding.  · Difficulty swallowing.  · Sudden  trouble seeing in one or both eyes.  · Double vision.  · Dizziness.  · Loss of balance or coordination.  · Sudden severe headache with no known cause.  · Trouble reading or writing.  · Loss of bowel or bladder control.  · Loss of consciousness.  DIAGNOSIS   Your caregiver may be able to determine the presence or absence of a TIA based on your symptoms, history, and physical exam. Computed tomography (CT scan) of the brain is usually performed to help identify a TIA. Other tests may be done to diagnose a TIA. These tests may include:  · Electrocardiography.  · Continuous heart monitoring.  · Echocardiography.  · Carotid ultrasonography.  · Magnetic resonance imaging (MRI).  · A scan of the brain circulation.  · Blood tests.  PREVENTION   The risk of a TIA can be decreased by appropriately treating high blood pressure, high cholesterol, diabetes, heart disease, and obesity and by quitting smoking, limiting alcohol, and staying physically active.  TREATMENT   Time is of the essence. Since the symptoms of TIA are the same as a stroke, it is important to seek treatment as soon as possible because you may need a medicine to dissolve the clot (thrombolytic) that cannot be given if too much time has passed. Treatment options vary. Treatment options may include rest, oxygen, intravenous (IV) fluids,   and medicines to thin the blood (anticoagulants). Medicines and diet may be used to address diabetes, high blood pressure, and other risk factors. Measures will be taken to prevent short-term and long-term complications, including infection from breathing foreign material into the lungs (aspiration pneumonia), blood clots in the legs, and falls. Treatment options include procedures to either remove plaque in the carotid arteries or dilate carotid arteries that have narrowed due to plaque. Those procedures are:  · Carotid endarterectomy.  · Carotid angioplasty and stenting.  HOME CARE INSTRUCTIONS   · Take all medicines prescribed  by your caregiver. Follow the directions carefully. Medicines may be used to control risk factors for a stroke. Be sure you understand all your medicine instructions.  · You may be told to take aspirin or the anticoagulant warfarin. Warfarin needs to be taken exactly as instructed.  ¨ Taking too much or too little warfarin is dangerous. Too much warfarin increases the risk of bleeding. Too little warfarin continues to allow the risk for blood clots. While taking warfarin, you will need to have regular blood tests to measure your blood clotting time. A PT blood test measures how long it takes for blood to clot. Your PT is used to calculate another value called an INR. Your PT and INR help your caregiver to adjust your dose of warfarin. The dose can change for many reasons. It is critically important that you take warfarin exactly as prescribed.  ¨ Many foods, especially foods high in vitamin K can interfere with warfarin and affect the PT and INR. Foods high in vitamin K include spinach, kale, broccoli, cabbage, collard and turnip greens, brussels sprouts, peas, cauliflower, seaweed, and parsley as well as beef and pork liver, green tea, and soybean oil. You should eat a consistent amount of foods high in vitamin K. Avoid major changes in your diet, or notify your caregiver before changing your diet. Arrange a visit with a dietitian to answer your questions.  ¨ Many medicines can interfere with warfarin and affect the PT and INR. You must tell your caregiver about any and all medicines you take, this includes all vitamins and supplements. Be especially cautious with aspirin and anti-inflammatory medicines. Do not take or discontinue any prescribed or over-the-counter medicine except on the advice of your caregiver or pharmacist.  ¨ Warfarin can have side effects, such as excessive bruising or bleeding. You will need to hold pressure over cuts for longer than usual. Your caregiver or pharmacist will discuss other  potential side effects.  ¨ Avoid sports or activities that may cause injury or bleeding.  ¨ Be mindful when shaving, flossing your teeth, or handling sharp objects.  ¨ Alcohol can change the body's ability to handle warfarin. It is best to avoid alcoholic drinks or consume only very small amounts while taking warfarin. Notify your caregiver if you change your alcohol intake.  ¨ Notify your dentist or other caregivers before procedures.  · Eat a diet that includes 5 or more servings of fruits and vegetables each day. This may reduce the risk of stroke. Certain diets may be prescribed to address high blood pressure, high cholesterol, diabetes, or obesity.  ¨ A low-sodium, low-saturated fat, low-trans fat, low-cholesterol diet is recommended to manage high blood pressure.  ¨ A low-saturated fat, low-trans fat, low-cholesterol, and high-fiber diet may control cholesterol levels.  ¨ A controlled-carbohydrate, controlled-sugar diet is recommended to manage diabetes.  ¨ A reduced-calorie, low-sodium, low-saturated fat, low-trans fat, low-cholesterol diet is recommended to manage obesity.  ·   Maintain a healthy weight.  · Stay physically active. It is recommended that you get at least 30 minutes of activity on most or all days.  · Do not smoke.  · Limit alcohol use even if you are not taking warfarin. Moderate alcohol use is considered to be:  ¨ No more than 2 drinks each day for men.  ¨ No more than 1 drink each day for nonpregnant women.  · Stop drug abuse.  · Home safety. A safe home environment is important to reduce the risk of falls. Your caregiver may arrange for specialists to evaluate your home. Having grab bars in the bedroom and bathroom is often important. Your caregiver may arrange for equipment to be used at home, such as raised toilets and a seat for the shower.  · Follow all instructions for follow-up with your caregiver. This is very important. This includes any referrals and lab tests. Proper follow up can  prevent a stroke or another TIA from occurring.  SEEK MEDICAL CARE IF:  · You have personality changes.  · You have difficulty swallowing.  · You are seeing double.  · You have dizziness.  · You have a fever.  · You have skin breakdown.  SEEK IMMEDIATE MEDICAL CARE IF:   Any of these symptoms may represent a serious problem that is an emergency. Do not wait to see if the symptoms will go away. Get medical help right away. Call your local emergency services (911 in [place] Do not drive yourself to the hospital.  · You have sudden weakness or numbness of the face, arm, or leg, especially on one side of the body.  · You have sudden trouble walking or difficulty moving arms or legs.  · You have sudden confusion.  · You have trouble speaking (aphasia) or understanding.  · You have sudden trouble seeing in one or both eyes.  · You have a loss of balance or coordination.  · You have a sudden, severe headache with no known cause.  · You have new chest pain or an irregular heartbeat.  · You have a partial or total loss of consciousness.  MAKE SURE YOU:   · Understand these instructions.  · Will watch your condition.  · Will get help right away if you are not doing well or get worse.  Document Released: 08/09/2005 Document Revised: 11/04/2013 Document Reviewed: 02/04/2014  [employer]® Patient Information ©2015 [employer]. This information is not intended to replace advice given to you by your health care provider. Make sure you discuss any questions you have with your health care provider.

## 2014-10-20 NOTE — Progress Notes (Signed)
UR completed 

## 2014-10-20 NOTE — Discharge Summary (Addendum)
[facility]  Physician Discharge Summary   Patient ID: Charles Marquez MRN: [id number] DOB/AGE: 1956/07/26 58 y.o.  Admit date: 10/19/2014 Discharge date: 10/20/2014  PCP: Evette Georges, MD  DISCHARGE DIAGNOSES:  Active Problems:   Hyperlipidemia   TIA (transient ischemic attack)   RECOMMENDATIONS FOR OUTPATIENT FOLLOW UP: 1. MRI Brain in 6 weeks 2. F/U with Dr. Jordan Likes afterwards.  DISCHARGE CONDITION: fair  Diet recommendation: Heart Healthy  Filed Weights   10/19/14 2034  Weight: 95.369 kg (210 lb 4 oz)    INITIAL HISTORY: Charles Marquez is a 58 y.o. Male with history of TIA who presented after developing tingling in right face, and right fingers. He also saw floaters earlier. His symptoms resolved by the time he was seen by the admitting doctor. He noted also at that time he was having some difficulty speaking with gabled words.   Consultations:  Neurology  Procedures: 2D ECHO Study Conclusions - Left ventricle: The cavity size was normal. Systolic function wasnormal. The estimated ejection fraction was in the range of 60%to 65%. Wall motion was normal; there were no regional wallmotion abnormalities. Doppler parameters are consistent withabnormal left ventricular relaxation (grade 1 diastolicdysfunction). - Aorta: Ascending aortic diameter: 42 mm (S). - Ascending aorta: The ascending aorta was mildly dilated. - Right ventricle: The cavity size was mildly dilated. Wallthickness was normal. Impressions: - No cardiac source of emboli was indentified.  Carotid Dopplers Bilateral: 1-39% ICA stenosis. Vertebral artery flow is antegrade  HOSPITAL COURSE:   Transient paresthesias and visual disturbances Possible TIA. Symptoms have resolved. Seen by neurology and he was changed to Plavix from aspirin. ECHO report as above. Patient to follow up with Neurology. Continue other meds.  Ventriculomegaly/Hydrocephalus MRI brain shows ventriculomegaly and  concern for hydrocephalus. He does not have any clinical symptoms of gait disturbance or incontinence. I have discussed with Dr. Julio Sicks with neurosurgery. He would like a repeat MRI of the brain with contrast in about 6 weeks time and follow-up with him after that MRI. This was communicated to the patient. Order for MRI was placed in the computer.  Ok for discharge today.  PERTINENT LABS:  The results of significant diagnostics from this hospitalization (including imaging, microbiology, ancillary and laboratory) are listed below for reference.     Labs: Basic Metabolic Panel:  Recent Labs Lab 10/19/14 2107  NA 140  K 4.1  CL 104  CO2 23  GLUCOSE 98  BUN 13  CREATININE 0.77  CALCIUM 8.8   CBC:  Recent Labs Lab 10/19/14 2107  WBC 5.7  HGB 12.7*  HCT 38.3*  MCV 91.0  PLT 164   CBG:  Recent Labs Lab 10/20/14 0115  GLUCAP 85     IMAGING STUDIES Dg Chest 2 View  10/20/2014   CLINICAL DATA:  58 year old male with a recent transient ischemic attack.  EXAM: CHEST  2 VIEW  COMPARISON:  Prior chest x-ray 10/07/2008  FINDINGS: The lungs are clear and negative for focal airspace consolidation, pulmonary edema or suspicious pulmonary nodule. Prominent nipple shadows. The right nipple shadow is unchanged dating back to November of 2009. No pleural effusion or pneumothorax. Cardiac and mediastinal contours are within normal limits. No acute fracture or lytic or blastic osseous lesions. The visualized upper abdominal bowel gas pattern is unremarkable.  IMPRESSION: Negative chest x-ray.   Electronically Signed   By: Malachy Moan M.D.   On: 10/20/2014 08:51   Mr Maxine Glenn Head Wo Contrast  10/20/2014   CLINICAL DATA:  TIA.  Episode of right mouth and finger tingling. Difficulty speaking.  EXAM: MRA HEAD WITHOUT CONTRAST  TECHNIQUE: Angiographic images of the Circle of Willis were obtained using MRA technique without intravenous contrast.  COMPARISON:  MRA circle of Willis 10/07/2008   FINDINGS: Internal carotid arteries are within normal limits from the high cervical segments through the ICA termini bilaterally. The A1 and M1 segments are within normal limits. The anterior communicating artery is patent. The right A1 segment is dominant. The anterior scratch the the MCA bifurcations are within normal limits. ACA and MCA branch vessels are normal bilaterally. These fill a better on today's study than on the previous exam.  The right vertebral artery is the dominant vessel. The PICA origins are visualized and normal bilaterally. The right AICA is dominant. There is stable mild distal narrowing of the basilar artery without a significant stenosis. The right posterior cerebral artery originates from the basilar tip. The left posterior cerebral artery is of fetal type. The PCA branch vessels are intact.  IMPRESSION: Normal variant MRA circle of Willis without evidence for significant proximal stenosis, aneurysm, or branch vessel occlusion.   Electronically Signed   By: Gennette Pachris  Mattern M.D.   On: 10/20/2014 12:15   [name] Contrast  10/19/2014   CLINICAL DATA:  Visual changes beginning at 630 this evening, 1 hour later, numbness and tingling in RIGHT arm with " funny " taste in mouth.  EXAM: MRI HEAD WITHOUT CONTRAST  TECHNIQUE: Multiplanar, multiecho pulse sequences of the brain and surrounding structures were obtained without intravenous contrast.  COMPARISON:  MRI of the brain October 07, 2008  FINDINGS: No reduced diffusion to suggest acute ischemia. No susceptibility artifact to suggest hemorrhage. Punctate foci of susceptibility artifact at the supra cerebellar cistern favor vessels.  Moderate ventriculomegaly, advanced from prior imaging. Confluent periventricular/transependymal T2 bright signal, new from prior imaging. No midline shift. No abnormal extra-axial fluid collections. Normal major intracranial vascular flow voids seen at the skull base.  No paranasal sinus air-fluid levels.  The mastoid air cells are well aerated. Ocular globes and orbital contents are nonsuspicious though not tailored for evaluation. No abnormal sellar expansion. No cerebellar tonsillar ectopia. No suspicious calvarial bone marrow signal.  IMPRESSION: Moderate ventriculomegaly, advanced from prior imaging, in addition to new confluent periventricular white matter T2 hyperintensities. Though this may reflect a component of chronic small vessel ischemic disease, findings are concerning for superimposed acute hydrocephalus.  No acute ischemia.  Acute findings discussed with and reconfirmed by Theressa Stampsr.BLAIR WALDEN on 10/19/2014 at 10:44 pm.   Electronically Signed   By: Awilda Metroourtnay  Bloomer   On: 10/19/2014 22:44    DISCHARGE EXAMINATION: See progress note from earlier today.  DISPOSITION: Home  Discharge Instructions    Ambulatory referral to Neurology    Complete by:  As directed   Stroke patient. Dr. Pearlean BrownieSethi prefers follow up in 1 month     Call MD for:  difficulty breathing, headache or visual disturbances    Complete by:  As directed      Call MD for:  extreme fatigue    Complete by:  As directed      Call MD for:  persistant dizziness or light-headedness    Complete by:  As directed      Call MD for:  severe uncontrolled pain    Complete by:  As directed      Diet - low sodium heart healthy    Complete by:  As directed  Discharge instructions    Complete by:  As directed   [facility] You should get a call from them. Please call Dr. Lindalou HosePool's office for appt in 6 weeks after MRI. Tell them that he recommended the same.     Increase activity slowly    Complete by:  As directed            ALLERGIES: No Known Allergies  Discharge Medication List as of 10/20/2014  1:26 PM    START taking these medications   Details  clopidogrel (PLAVIX) 75 MG tablet Take 1 tablet (75 mg total) by mouth daily., Starting 10/21/2014, Until Discontinued, Print        CONTINUE these medications which have NOT CHANGED   Details  EPINEPHrine (EPI-PEN) 0.3 mg/0.3 mL SOAJ Inject 0.3 mLs (0.3 mg total) into the muscle once., Starting 06/09/2013, Normal    simvastatin (ZOCOR) 80 MG tablet Take 80 mg by mouth daily., Until Discontinued, Historical Med    traMADol (ULTRAM) 50 MG tablet Take 50 mg by mouth every 6 (six) hours as needed for moderate pain., Until Discontinued, Historical Med      STOP taking these medications     predniSONE (DELTASONE) 20 MG tablet        Follow-up Information    Follow up with POOL,HENRY A, MD. Schedule an appointment as soon as possible for a visit in 6 weeks.   Specialty:  Neurosurgery   Why:  after MRI brain. to discuss enlarged ventricles noted on brain MRI.   Contact information:   [address] [place] [address] [phone]       Follow up with TODD,JEFFREY ALLEN, MD. Schedule an appointment as soon as possible for a visit in 1 week.   Specialty:  Family Medicine   Why:  post hospitalization follow up   Contact information:   [address] [place] [address] [phone]       Follow up with SETHI,PRAMOD, MD In 1 month.   Specialties:  Neurology, Radiology   Why:  Stroke Clinic, Office will call you with appointment date & time   Contact information:   [address] [place] [address] [phone]       TOTAL DISCHARGE TIME: 35 mins.  [facility]S HospitalKRISHNAN,Arihanna Estabrook  [facility] Pager [phone]  10/20/2014, 3:07 PM

## 2014-10-20 NOTE — Progress Notes (Signed)
Attempted to see pt. After speaking with pt, pt is now at baseline and has no deficits and is I with all adls.  Will sign off of OT.  Please reconsult if status changes. Tory EmeraldHolly Perrin Gens, [place] Carolina[place]R/L [phone]

## 2014-10-20 NOTE — H&P (Signed)
[facility] History and Physical  Charles HasKurt Aranas ZOX:[id number] DOB: 03-Jan-1956 DOA: 10/19/2014  Referring physician: Elwin MochaBlair Walden, MD PCP: Evette GeorgesDD,JEFFREY ALLEN, MD   Chief Complaint: TIA  HPI: Charles Marquez is a 58 y.o. male presents with TIA. He states he has had a similar episode in 2009 at that time on the left side. Patient states tonight he had a floater in front of his eyes associated with blurry vision which was transient. About 45 minutes later he noted tingling right mouth tingling and finger tingling. He noted also at that time he was having some difficulty speaking with gabled words. He had no headaches dizziness or light headedness. He did not lose consciousness. No seizure disorder in the past. Patient states that he has no cardiac disease. Non-smoker and states rarely drinks. 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 Symptoms have resolved   Review of Systems:  Constitutional:  No weight loss, night sweats, Fevers, chills, fatigue.  HEENT:  ++headaches, Difficulty swallowing,Tooth/dental problems,Sore throat,  No sneezing, itching, ear ache Cardio-vascular:  No chest pain, Orthopnea, PND, swelling in lower extremities, dizziness, palpitations  GI:  No heartburn, indigestion, abdominal pain, nausea, vomiting, diarrhea  Resp:  No shortness of breath with exertion or at rest. No excess mucus, no productive cough, No coughing up of blood  Skin:  no rash or lesions GU:  no dysuria, change in color of urine, no urgency or frequency  Musculoskeletal:  No joint pain or swelling. No decreased range of motion  Psych:  No change in mood or affect. No depression or anxiety   Past Medical History  Diagnosis Date  . TIA (transient ischemic attack)   . Hypercholesteremia    Past Surgical History  Procedure Laterality Date  . Hernia repair    . Fractured arm     Social History:  reports that he has never smoked. He has never used smokeless tobacco. He  reports that he drinks alcohol. He reports that he does not use illicit drugs.  No Known Allergies  No family history on file.   Prior to Admission medications   Medication Sig Start Date End Date Taking? Authorizing Provider  EPINEPHrine (EPI-PEN) 0.3 mg/0.3 mL SOAJ Inject 0.3 mLs (0.3 mg total) into the muscle once. 06/09/13  Yes Roderick PeeJeffrey A Todd, MD  simvastatin (ZOCOR) 80 MG tablet Take 80 mg by mouth daily.   Yes Historical Provider, MD  traMADol (ULTRAM) 50 MG tablet Take 50 mg by mouth every 6 (six) hours as needed for moderate pain.   Yes Historical Provider, MD  predniSONE (DELTASONE) 20 MG tablet Take 1 tablet (20 mg total) by mouth daily. Patient not taking: Reported on 10/19/2014 06/09/13   Roderick PeeJeffrey A Todd, MD  simvastatin (ZOCOR) 80 MG tablet TAKE 1 TABLET BY MOUTH AT BEDTIME Patient not taking: Reported on 10/19/2014 07/21/14   Roderick PeeJeffrey A Todd, MD  traMADol Janean Sark(ULTRAM) 50 MG tablet One tablet each bedtime when necessary for back pain Patient not taking: Reported on 10/19/2014 04/15/13   Roderick PeeJeffrey A Todd, MD   Physical Exam: Filed Vitals:   10/19/14 2115 10/19/14 2130 10/19/14 2145 10/19/14 2339  BP: 126/80 133/83 131/81 130/81  Pulse: 62 63 69 57  Temp:      TempSrc:      Resp: 16 17 17 13   Height:      Weight:      SpO2: 96% 96% 96% 96%    Wt Readings from Last 3 Encounters:  10/19/14 95.369  kg (210 lb 4 oz)  04/15/13 101.606 kg (224 lb)  07/23/12 94.802 kg (209 lb)    General:  Appears calm and comfortable Eyes: PERRL, normal lids, irises & conjunctiva ENT: grossly normal hearing, lips & tongue Neck: no LAD, masses or thyromegaly Cardiovascular: RRR, no m/r/g. No LE edema. Telemetry: SR, no arrhythmias  Respiratory: CTA bilaterally, no w/r/r. Normal respiratory effort. Abdomen: soft, ntnd Skin: no rash or induration seen on limited exam Musculoskeletal: grossly normal tone BUE/BLE Psychiatric: grossly normal mood and affect, speech fluent and appropriate Neurologic:  grossly non-focal.          Labs on Admission:  Basic Metabolic Panel:  Recent Labs Lab 10/19/14 2107  NA 140  K 4.1  CL 104  CO2 23  GLUCOSE 98  BUN 13  CREATININE 0.77  CALCIUM 8.8   Liver Function Tests: No results for input(s): AST, ALT, ALKPHOS, BILITOT, PROT, ALBUMIN in the last 168 hours. No results for input(s): LIPASE, AMYLASE in the last 168 hours. No results for input(s): AMMONIA in the last 168 hours. CBC:  Recent Labs Lab 10/19/14 2107  WBC 5.7  HGB 12.7*  HCT 38.3*  MCV 91.0  PLT 164   Cardiac Enzymes: No results for input(s): CKTOTAL, CKMB, CKMBINDEX, TROPONINI in the last 168 hours.  BNP (last 3 results) No results for input(s): PROBNP in the last 8760 hours. CBG: No results for input(s): GLUCAP in the last 168 hours.  Radiological Exams on Admission: [name] Contrast  10/19/2014   CLINICAL DATA:  Visual changes beginning at 630 this evening, 1 hour later, numbness and tingling in RIGHT arm with " funny " taste in mouth.  EXAM: MRI HEAD WITHOUT CONTRAST  TECHNIQUE: Multiplanar, multiecho pulse sequences of the brain and surrounding structures were obtained without intravenous contrast.  COMPARISON:  MRI of the brain October 07, 2008  FINDINGS: No reduced diffusion to suggest acute ischemia. No susceptibility artifact to suggest hemorrhage. Punctate foci of susceptibility artifact at the supra cerebellar cistern favor vessels.  Moderate ventriculomegaly, advanced from prior imaging. Confluent periventricular/transependymal T2 bright signal, new from prior imaging. No midline shift. No abnormal extra-axial fluid collections. Normal major intracranial vascular flow voids seen at the skull base.  No paranasal sinus air-fluid levels. The mastoid air cells are well aerated. Ocular globes and orbital contents are nonsuspicious though not tailored for evaluation. No abnormal sellar expansion. No cerebellar tonsillar ectopia. No suspicious calvarial bone marrow  signal.  IMPRESSION: Moderate ventriculomegaly, advanced from prior imaging, in addition to new confluent periventricular white matter T2 hyperintensities. Though this may reflect a component of chronic small vessel ischemic disease, findings are concerning for superimposed acute hydrocephalus.  No acute ischemia.  Acute findings discussed with and reconfirmed by Theressa Stampsr.BLAIR WALDEN on 10/19/2014 at 10:44 pm.   Electronically Signed   By: Awilda Metroourtnay  Bloomer   On: 10/19/2014 22:44     Assessment/Plan Active Problems:   Hyperlipidemia   TIA (transient ischemic attack)   1. TIA -will admit for observation -will check carotid doppler -check echo -MRI done in ED -neurology follow up  2. Hyperlipidemia -continue with statins    Code Status: Full Code (must indicate code status--if unknown or must be presumed, indicate so) DVT Prophylaxis:Heparin Family Communication: Wife (indicate person spoken with, if applicable, with phone number if by telephone) Disposition Plan: Home (indicate anticipated LOS)  Time spent: 60min  [facility] AscKHAN,SAADAT A [facility] Pager [phone]

## 2014-10-20 NOTE — Progress Notes (Signed)
  Patient was admitted earlier this morning. H&P has been reviewed.  Patient states that all his symptoms have resolved at this time. Denies any headaches. No visual disturbances. No weakness on any one side.  Vital signs are stable. Lungs are clear. No station bilaterally S1 S2 is normal, regular, no S3, S4, no rubs, murmurs, bruit. Abdomen is soft, nontender, nondistended. Bowel sounds present, no masses, organomegaly. He is alert and oriented 3. Cranial nerves II-12 intact. Motor strength is 5-5 bilateral upper and lower extremities.  Transient paresthesias and visual disturbances, which have resolved. This could have been a TIA. MRI brain shows ventriculomegaly and concern for hydrocephalus. He does not have any clinical symptoms of gait disturbance or incontinence. I have discussed with Dr. Lelon PerlaHenry Poole with neurosurgery. He would like a repeat MRI of the brain with contrast in about 6 weeks time and follow-up with him after that MRI.  We await the TIA workup to be completed. Neurology to see again this morning. He was taking 325 mg of aspirin every day. Neurology to determine if this needs to be changed.  If all workup is negative and if cleared by neurology. He may be able to go home later today.  Osvaldo ShipperKRISHNAN,Amaan Meyer 10/20/2014

## 2014-10-20 NOTE — Progress Notes (Signed)
STROKE TEAM PROGRESS NOTE   HISTORY Charles Marquez is a 58 y.o. male with a history of TIA 6 years ago who presents with transient right facial numbness and tingling as well as right hand facial and tingling and difficulty with speech. This lasted for approximately 10-15 minutes. He denies headache. He denies a history of migraines. He has since returned completely to normal.  Though he has occasional headaches, these have been seasonal and long-standing, nothing recently within the past few months to years. He denies urinary incontinence, denies worsening memory problems, denies any changes to his gait. When he does get headaches, they're more pronounced in the afternoons.  He was LKW in the ED when seen by neurology.   Patient was not administered TPA secondary to resolved symptoms. He was admitted for further evaluation and treatment.   SUBJECTIVE (INTERVAL HISTORY) His wife is at the bedside.  Overall he feels his condition is stable. He denies symptoms of hydrocephalus prior to admission. Hx concussion as a child.   OBJECTIVE Temp:  [97.7 F (36.5 C)-98.2 F (36.8 C)] 97.7 F (36.5 C) (12/08 0929) Pulse Rate:  [57-69] 64 (12/08 0929) Cardiac Rhythm:  [-] Sinus bradycardia (12/08 1128) Resp:  [12-18] 18 (12/08 0929) BP: (122-145)/(79-93) 122/79 mmHg (12/08 0929) SpO2:  [94 %-97 %] 96 % (12/08 0929) Weight:  [95.369 kg (210 lb 4 oz)] 95.369 kg (210 lb 4 oz) (12/07 2034)   Recent Labs Lab 10/20/14 0115  GLUCAP 85    Recent Labs Lab 10/19/14 2107  NA 140  K 4.1  CL 104  CO2 23  GLUCOSE 98  BUN 13  CREATININE 0.77  CALCIUM 8.8   No results for input(s): AST, ALT, ALKPHOS, BILITOT, PROT, ALBUMIN in the last 168 hours.  Recent Labs Lab 10/19/14 2107  WBC 5.7  HGB 12.7*  HCT 38.3*  MCV 91.0  PLT 164   No results for input(s): CKTOTAL, CKMB, CKMBINDEX, TROPONINI in the last 168 hours. No results for input(s): LABPROT, INR in the last 72 hours. No results for  input(s): COLORURINE, LABSPEC, PHURINE, GLUCOSEU, HGBUR, BILIRUBINUR, KETONESUR, PROTEINUR, UROBILINOGEN, NITRITE, LEUKOCYTESUR in the last 72 hours.  Invalid input(s): APPERANCEUR     Component Value Date/Time   CHOL 144 10/20/2014 0421   TRIG 97 10/20/2014 0421   HDL 42 10/20/2014 0421   CHOLHDL 3.4 10/20/2014 0421   VLDL 19 10/20/2014 0421   LDLCALC 83 10/20/2014 0421   Lab Results  Component Value Date   HGBA1C 5.5 10/29/2008      Component Value Date/Time   LABOPIA NONE DETECTED 10/19/2014 2342   COCAINSCRNUR NONE DETECTED 10/19/2014 2342   LABBENZ NONE DETECTED 10/19/2014 2342   AMPHETMU NONE DETECTED 10/19/2014 2342   THCU NONE DETECTED 10/19/2014 2342   LABBARB NONE DETECTED 10/19/2014 2342    No results for input(s): ETH in the last 168 hours.  Dg Chest 2 View  10/20/2014   CLINICAL DATA:  58 year old male with a recent transient ischemic attack.  EXAM: CHEST  2 VIEW  COMPARISON:  Prior chest x-ray 10/07/2008  FINDINGS: The lungs are clear and negative for focal airspace consolidation, pulmonary edema or suspicious pulmonary nodule. Prominent nipple shadows. The right nipple shadow is unchanged dating back to November of 2009. No pleural effusion or pneumothorax. Cardiac and mediastinal contours are within normal limits. No acute fracture or lytic or blastic osseous lesions. The visualized upper abdominal bowel gas pattern is unremarkable.  IMPRESSION: Negative chest x-ray.   Electronically Signed  By: Malachy Moan M.D.   On: 10/20/2014 08:51   Mr Maxine Glenn Head Wo Contrast  10/20/2014   CLINICAL DATA:  TIA. Episode of right mouth and finger tingling. Difficulty speaking.  EXAM: MRA HEAD WITHOUT CONTRAST  TECHNIQUE: Angiographic images of the Circle of Willis were obtained using MRA technique without intravenous contrast.  COMPARISON:  MRA circle of Willis 10/07/2008  FINDINGS: Internal carotid arteries are within normal limits from the high cervical segments through the ICA  termini bilaterally. The A1 and M1 segments are within normal limits. The anterior communicating artery is patent. The right A1 segment is dominant. The anterior scratch the the MCA bifurcations are within normal limits. ACA and MCA branch vessels are normal bilaterally. These fill a better on today's study than on the previous exam.  The right vertebral artery is the dominant vessel. The PICA origins are visualized and normal bilaterally. The right AICA is dominant. There is stable mild distal narrowing of the basilar artery without a significant stenosis. The right posterior cerebral artery originates from the basilar tip. The left posterior cerebral artery is of fetal type. The PCA branch vessels are intact.  IMPRESSION: Normal variant MRA circle of Willis without evidence for significant proximal stenosis, aneurysm, or branch vessel occlusion.   Electronically Signed   By: Gennette Pac M.D.   On: 10/20/2014 12:15   [name] Contrast  10/19/2014   CLINICAL DATA:  Visual changes beginning at 630 this evening, 1 hour later, numbness and tingling in RIGHT arm with " funny " taste in mouth.  EXAM: MRI HEAD WITHOUT CONTRAST  TECHNIQUE: Multiplanar, multiecho pulse sequences of the brain and surrounding structures were obtained without intravenous contrast.  COMPARISON:  MRI of the brain October 07, 2008  FINDINGS: No reduced diffusion to suggest acute ischemia. No susceptibility artifact to suggest hemorrhage. Punctate foci of susceptibility artifact at the supra cerebellar cistern favor vessels.  Moderate ventriculomegaly, advanced from prior imaging. Confluent periventricular/transependymal T2 bright signal, new from prior imaging. No midline shift. No abnormal extra-axial fluid collections. Normal major intracranial vascular flow voids seen at the skull base.  No paranasal sinus air-fluid levels. The mastoid air cells are well aerated. Ocular globes and orbital contents are nonsuspicious though not  tailored for evaluation. No abnormal sellar expansion. No cerebellar tonsillar ectopia. No suspicious calvarial bone marrow signal.  IMPRESSION: Moderate ventriculomegaly, advanced from prior imaging, in addition to new confluent periventricular white matter T2 hyperintensities. Though this may reflect a component of chronic small vessel ischemic disease, findings are concerning for superimposed acute hydrocephalus.  No acute ischemia.  Acute findings discussed with and reconfirmed by Theressa Stamps on 10/19/2014 at 10:44 pm.   Electronically Signed   By: Awilda Metro   On: 10/19/2014 22:44   Carotid Doppler  There is 1-39% bilateral ICA stenosis. Vertebral artery flow is antegrade.      PHYSICAL EXAM Obese middle-aged Caucasian male currently not in distress.Awake alert. Afebrile. Head is nontraumatic. Neck is supple without bruit. Hearing is normal. Cardiac exam no murmur or gallop. Lungs are clear to auscultation. Distal pulses are well felt. Neurological Exam ;  Awake  Alert oriented x 3. Normal speech and language.eye movements full without nystagmus.fundi were not visualized. Vision acuity and fields appear normal. Hearing is normal. Palatal movements are normal. Face symmetric. Tongue midline. Normal strength, tone, reflexes and coordination. Normal sensation. Gait deferred. ASSESSMENT/PLAN Charles Marquez is a 58 y.o. male with history of TIA 6 years ago presenting  with right-sided numbness and visual changes. He did not receive IV t-PA due to symptoms resolved.   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 Change to plavix 75 mg daily  Patient counseled to be compliant with his antithrombotic medications  Ongoing aggressive stroke risk factor management  Therapy recommendations:  No therapy needs  Disposition:  D/c home  Follow up Dr. Dutch QuintPoole related to hydrocephalus  Patient has a 10-15% risk of having another stroke over the next year, the highest risk is within 2 weeks of the most recent stroke/TIA (risk of having a stroke following a stroke or TIA is the same).  Ongoing risk factor control by Primary Care Physician  Stroke Service will sign off. Please call should any needs arise.  [facility]), order placed.  Hypertension  Home meds:   None  BP 122-145/79-93 past 24h (10/20/2014 @ 1:06 PM)   Mildly elevated  Hyperlipidemia  Home meds:  zocor 80 mg daily, changed to lipitor 40 (formulary change)  LDL 83, goal < 70  Continue statin at discharge  Other Stroke Risk Factors  ETOH use  Hx stroke/TIA 2009 (L body symptoms)  Hospital day # 1  Rhoderick MoodyBIBY,SHARON  [facility] See Amion for Pager information 10/20/2014 1:10 PM  I have personally examined this patient, reviewed notes, independently viewed imaging studies, participated in medical decision making and plan of care. I have made any additions or clarifications directly to the above note. Agree with note above. Patient has had transient Cheiro-oral paresthesias and dysarthria likely due to TIA from small vessel disease. Complete neurovascular workup. MRI scan shows hydrocephalus which appears new since previous CT scan from 2009. He however does not seem to have significant problems with gait ataxia or headaches and hence this can be followed as an outpatient by neurosurgeon.  Delia HeadyPramod Sethi, MD Medical Director [facility] Pager: [phone] 10/20/2014 7:42 PM    To contact Stroke Continuity provider, please refer  to [email or web address]. After hours, contact General Neurology

## 2014-10-20 NOTE — Progress Notes (Signed)
Echocardiogram 2D Echocardiogram has been performed.  Charles BasemanReel, Charles Marquez M 10/20/2014, 12:01 PM

## 2014-10-20 NOTE — Progress Notes (Signed)
VASCULAR LAB PRELIMINARY  PRELIMINARY  PRELIMINARY  PRELIMINARY  Carotid duplex  completed.    Preliminary report:  Bilateral:  1-39% ICA stenosis.  Vertebral artery flow is antegrade.      Pierra Skora, RVT 10/20/2014, 11:28 AM

## 2014-10-20 NOTE — Progress Notes (Signed)
Pt arrived to 4N01 from the ED via stretcher. Pt ambulated from stretcher to bed with no assist. Pt oriented to room and call bell. Vitals stable. Pt resting comfortably.

## 2014-10-20 NOTE — Plan of Care (Signed)
Problem: Consults Goal: Ischemic Stroke Patient Education See Patient Education Module for education specifics. Outcome: Progressing Goal: Skin Care Protocol Initiated - if Braden Score 18 or less If consults are not indicated, leave blank or document N/A Outcome: Not Applicable Date Met:  54/00/86  Problem: Acute Treatment Outcomes Goal: Neuro exam at baseline or improved Outcome: Completed/Met Date Met:  10/20/14 Goal: BP within ordered parameters Outcome: Completed/Met Date Met:  10/20/14 Goal: Airway maintained/protected Outcome: Completed/Met Date Met:  10/20/14 Goal: 02 Sats > 94% Outcome: Completed/Met Date Met:  10/20/14 Goal: Hemodynamically stable Outcome: Completed/Met Date Met:  10/20/14 Goal: tPA Patient w/o S&S of bleeding Outcome: Not Applicable Date Met:  76/19/50 Goal: Prognosis discussed with family/patient as appropriate Outcome: Completed/Met Date Met:  10/20/14 Goal: Other Acute Treatment Outcomes Outcome: Progressing

## 2014-10-20 NOTE — Consult Note (Signed)
Neurology Consultation Reason for Consult: TIA Referring Physician: Gwendolyn GrantWalden, B  CC: TIA  History is obtained from: Patient, wife  HPI: Charles HasKurt Marquez is a 58 y.o. male with a history of TIA 6 years ago who presents with transient right facial numbness and tingling as well as right hand facial and tingling and difficulty with speech. This lasted for approximately 10-15 minutes. He denies headache. He denies a history of migraines. He Marquez since returned completely to normal.  Though he Marquez occasional headaches, these have been seasonal and long-standing, nothing recently within the past few months to years. He denies urinary incontinence, denies worsening memory problems, denies any changes to his gait. When he does get headaches, they're more pronounced in the afternoons.   LKW: Currently back to baseline tpa given?: no, resolved    ROS: A 14 point ROS was performed and is negative except as noted in the HPI.   Past Medical History  Diagnosis Date  . TIA (transient ischemic attack)   . Hypercholesteremia     Family History: No history of stroke  Social History: Tob: Denies  Exam: Current vital signs: BP 132/85 mmHg  Pulse 57  Temp(Src) 98.2 F (36.8 C) (Oral)  Resp 17  Ht 5\' 11"  (1.803 m)  Wt 95.369 kg (210 lb 4 oz)  BMI 29.34 kg/m2  SpO2 96% Vital signs in last 24 hours: Temp:  [98.2 F (36.8 C)] 98.2 F (36.8 C) (12/07 2034) Pulse Rate:  [57-69] 57 (12/08 0000) Resp:  [13-18] 17 (12/08 0000) BP: (124-145)/(80-93) 132/85 mmHg (12/08 0000) SpO2:  [94 %-97 %] 96 % (12/08 0000) Weight:  [95.369 kg (210 lb 4 oz)] 95.369 kg (210 lb 4 oz) (12/07 2034)  General: In bed, NAD  Physical Exam  Constitutional: Appears well-developed and well-nourished.  Psych: Affect appropriate to situation Eyes: No scleral injection HENT: No OP obstrucion Head: Normocephalic.  Cardiovascular: Normal rate and regular rhythm.  Respiratory: Effort normal and breath sounds normal to  anterior ascultation GI: Soft.  No distension. There is no tenderness.  Skin: WDI  Neuro: Mental Status: Patient is awake, alert, oriented to person, place, month, year, and situation. Immediate and remote memory are intact. Patient is able to give a clear and coherent history. No signs of aphasia or neglect Cranial Nerves: II: Visual Fields are full. Pupils are equal, round, and reactive to light.  I am unable to visualize a good disc margin and there may be some blurring, but his pupils are relatively small limiting the exam. III,IV, VI: EOMI without ptosis or diploplia.  V: Facial sensation is symmetric to temperature VII: Facial movement is symmetric.  VIII: hearing is intact to voice X: Uvula elevates symmetrically XI: Shoulder shrug is symmetric. XII: tongue is midline without atrophy or fasciculations.  Motor: Tone is normal. Bulk is normal. 5/5 strength was present in all four extremities.  Sensory: Sensation is symmetric to light touch and temperature in the arms and legs. Deep Tendon Reflexes: 2+ and symmetric in the biceps and patellae.  Plantars: Toes are downgoing bilaterally.  Cerebellar: FNF and HKS are intact bilaterally Gait: Patient Marquez a stable narrow based casual gait.     I have reviewed labs in epic and the results pertinent to this consultation are: Bmp, cbc unremarkable  I have reviewed the images obtained:MRI brain - enlarged ventricles compared to 2009  Impression: [age] year old male with likely TIA consisting of right-sided numbness and visual changes. I do not think his hydrocephalus is related to this,  however with the dilatation of his fourth ventricle I do not think that small vessel disease alone would explain his findings.  I'm unable to put any symptoms to go with his ventriculomegaly. Given the lack of symptoms, I doubt that it is acute.  Recommendations: 1. HgbA1c, fasting lipid panel 2. MRI, MRA  of the brain without contrast 3.  Frequent neuro checks 4. Echocardiogram 5. Carotid dopplers 6. Prophylactic therapy-Antiplatelet med: Aspirin - dose 325mg  PO or 300mg  PR 7. Risk factor modification 8. Telemetry monitoring 9. PT consult, OT consult, Speech consult 10. Routine neurosurgical consultation for hydrocephalus    Charles SlotMcNeill Tesean Stump, MD [facility] [phone]  If 7pm- 7am, please page neurology on call as listed in AMION.

## 2014-10-20 NOTE — Evaluation (Signed)
Physical Therapy Evaluation Patient Details Name: Charles Marquez MRN: [id number] DOB: 09-27-1956 Today's Date: 10/20/2014   History of Present Illness  Patient is a 58 y/o male admitted with TIA. Reports late on 12/8 pt had a floater in front of his eyes associated with blurry vision which was transient. About 45 minutes later he noted right mouth tingling and finger tingling. He noted also at that time he was having some difficulty speaking with garbled words.  MRI brain- findings are concerning for superimposed acute hydrocephalus?    Clinical Impression  Patient presents close to functional baseline and able to ambulate community distances and negotiate steps without difficulty. Tolerated higher level balance challenges as well without LOB. Pt reports no symptoms - everything has resolved. Pt does not require skilled therapy services. Encourage daily ambulation. Discharge from therapy.    Follow Up Recommendations No PT follow up    Equipment Recommendations  None recommended by PT    Recommendations for Other Services       Precautions / Restrictions Precautions Precautions: None Restrictions Weight Bearing Restrictions: No      Mobility  Bed Mobility Overal bed mobility: Modified Independent             General bed mobility comments: HOB elevated.  Transfers Overall transfer level: Independent Equipment used: None                Ambulation/Gait Ambulation/Gait assistance: Independent Ambulation Distance (Feet): 300 Feet Assistive device: None Gait Pattern/deviations: WFL(Within Functional Limits)   Gait velocity interpretation: at or above normal speed for age/gender General Gait Details: No balance deficits noted during gait. Tolerated higher level balance challenges without difficulty.   Stairs Stairs: Yes Stairs assistance: Modified independent (Device/Increase time) Stair Management: One rail Right;Alternating pattern Number of Stairs: 12 General  stair comments: Skipped a step every couple of steps. No difficulty. "Should I run them or walk them?"  Wheelchair Mobility    Modified Rankin (Stroke Patients Only)       Balance Overall balance assessment: Needs assistance   Sitting balance-Leahy Scale: Normal     Standing balance support: During functional activity Standing balance-Leahy Scale: Normal               High level balance activites: Braiding;Direction changes;Turns;Sudden stops;Head turns;Side stepping High Level Balance Comments: Tolerated above high level balance activities without LOB or deviations in gait speed.  Standardized Balance Assessment Standardized Balance Assessment : Dynamic Gait Index   Dynamic Gait Index Level Surface: Normal Change in Gait Speed: Normal Gait with Horizontal Head Turns: Normal Gait with Vertical Head Turns: Normal Gait and Pivot Turn: Normal Step Over Obstacle: Normal Step Around Obstacles: Normal Steps: Mild Impairment Total Score: 23       Pertinent Vitals/Pain Pain Assessment: No/denies pain    Home Living Family/patient expects to be discharged to:: Private residence Living Arrangements: Spouse/significant other Available Help at Discharge: Family Type of Home: House Home Access: Stairs to enter Entrance Stairs-Rails: Right Entrance Stairs-Number of Steps: 4 Home Layout: One level Home Equipment: None      Prior Function Level of Independence: Independent         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     Hand Dominance        Extremity/Trunk Assessment   Upper Extremity Assessment: Overall WFL for tasks assessed           Lower Extremity Assessment: Overall WFL for tasks assessed      Cervical /  Trunk Assessment: Normal  Communication   Communication: No difficulties  Cognition Arousal/Alertness: Awake/alert Behavior During Therapy: WFL for tasks assessed/performed Overall Cognitive Status: Within Functional Limits  for tasks assessed                      General Comments      Exercises        Assessment/Plan    PT Assessment Patent does not need any further PT services  PT Diagnosis     PT Problem List    PT Treatment Interventions     PT Goals (Current goals can be found in the Care Plan section) Acute Rehab PT Goals PT Goal Formulation: All assessment and education complete, DC therapy    Frequency     Barriers to discharge        Co-evaluation               End of Session   Activity Tolerance: Patient tolerated treatment well Patient left: Other (comment) (Ambulating in room.) Nurse Communication: Mobility status    Functional Assessment Tool Used: Clinical judgment Functional Limitation: Mobility: Walking and moving around Mobility: Walking and Moving Around Current Status ([id number]): At least 1 percent but less than 20 percent impaired, limited or restricted Mobility: Walking and Moving Around Goal Status [phone]([phone]): 0 percent impaired, limited or restricted Mobility: Walking and Moving Around Discharge Status [phone]([id number]): 0 percent impaired, limited or restricted    Time: 1052-1102 PT Time Calculation (min) (ACUTE ONLY): 10 min   Charges:   PT Evaluation $Initial PT Evaluation Tier I: 1 Procedure PT Treatments $Gait Training: 8-22 mins   PT G Codes:   Functional Assessment Tool Used: Clinical judgment Functional Limitation: Mobility: Walking and moving around    [place] Hart[place]olan, Oak GlenShauna A 10/20/2014, 11:32 AM Alvie HeidelbergShauna Folan, PT, DPT [phone]

## 2014-10-22 ENCOUNTER — Other Ambulatory Visit: Payer: Self-pay | Admitting: Family Medicine

## 2014-10-22 DIAGNOSIS — G459 Transient cerebral ischemic attack, unspecified: Secondary | ICD-10-CM

## 2014-10-27 ENCOUNTER — Ambulatory Visit: Payer: 59 | Admitting: Family Medicine

## 2014-11-17 ENCOUNTER — Telehealth: Payer: Self-pay | Admitting: Family Medicine

## 2014-11-17 NOTE — Telephone Encounter (Signed)
Pt request refill of the following:   simvastatin (ZOCOR) 80 MG tablet  Pt was told since he is now on plavix that crestor would be better for him. He is asking for a refill on Simvastatin or a new rx for the Crestor    Phamacy:  [facility]  [place]

## 2014-11-18 NOTE — Telephone Encounter (Signed)
Per Dr Tawanna Coolerodd patient should see if insurance will cover Crestor 20 mg or Lipitor/generic 40 mg.  He should also come back in 2 months for follow up lipid and hepatic panel.  Patient is aware and will call back with which medication to fill at pharmacy.

## 2014-11-19 MED ORDER — ATORVASTATIN CALCIUM 40 MG PO TABS: 40.0000 mg | ORAL_TABLET | Freq: Every day | ORAL | Status: DC

## 2014-11-19 NOTE — Telephone Encounter (Signed)
Rx for Lipitor sent to pharmacy.

## 2014-12-11 ENCOUNTER — Other Ambulatory Visit: Payer: Self-pay | Admitting: Neurosurgery

## 2014-12-14 NOTE — Pre-Procedure Instructions (Addendum)
Charles Marquez  12/14/2014   Your procedure is scheduled on:  Friday, February 5.              [facility] at 5:30 AM.  Call this number if you have problems the morning of surgery: [phone]              For any other questions, please call [phone], Monday - Friday 8 AM - 4 PM.  Remember:   Do not eat food or drink liquids after midnight Thursday, February 4.   Take these medicines the morning of surgery with A SIP OF WATER: None.   STOP all herbel meds, nsaids (aleve,naproxen,advil,ibuprofen) now including vitamins   Stop plavix ? Per dr    Drucilla Schmidto not wear jewelry, make-up or nail polish.  Do not wear lotions, powders, or perfumes.    Men may shave face and neck.  Do not bring valuables to the hospital.               [facility] is not responsible for any belongings or valuables.               Contacts, dentures or bridgework may not be worn into surgery.  Leave suitcase in the car. After surgery it may be brought to your room.  For patients admitted to the hospital, discharge time is determined by your  treatment team.               Patients discharged the day of surgery will not be allowed to drive home.  Name and phone number of your driver: -   Special Instructions: - Special Instructions: [place] - Preparing for Surgery  Before surgery, you can play an important role.  Because skin is not sterile, your skin needs to be as free of germs as possible.  You can reduce the number of germs on you skin by washing with CHG (chlorahexidine gluconate) soap before surgery.  CHG is an antiseptic cleaner which kills germs and bonds with the skin to continue killing germs even after washing.  Please DO NOT use if you have an allergy to CHG or antibacterial soaps.  If your skin becomes reddened/irritated stop using the CHG and inform your nurse when you arrive at Short Stay.  Do not shave (including legs and underarms) for at least 48 hours prior to the  first CHG shower.  You may shave your face.  Please follow these instructions carefully:   1.  Shower with CHG Soap the night before surgery and the morning of Surgery.  2.  If you choose to wash your hair, wash your hair first as usual with your normal shampoo.  3.  After you shampoo, rinse your hair and body thoroughly to remove the Shampoo.  4.  Use CHG as you would any other liquid soap.  You can apply chg directly  to the skin and wash gently with scrungie or a clean washcloth.  5.  Apply the CHG Soap to your body ONLY FROM THE NECK DOWN.  Do not use on open wounds or open sores.  Avoid contact with your eyes ears, mouth and genitals (private parts).  Wash genitals (private parts)       with your normal soap.  6.  Wash thoroughly, paying special attention to the area where your surgery will be performed.  7.  Thoroughly rinse your body with warm water from the neck down.  8.  DO NOT shower/wash with  your normal soap after using and rinsing off the CHG Soap.  9.  Pat yourself dry with a clean towel.            10.  Wear clean pajamas.            11.  Place clean sheets on your bed the night of your first shower and do not sleep with pets.  Day of Surgery  Do not apply any lotions/deodorants the morning of surgery.  Please wear clean clothes to the hospital/surgery center.   Please read over the following fact sheets that you were given: Pain Booklet, Coughing and Deep Breathing and Surgical Site Infection Prevention

## 2014-12-15 ENCOUNTER — Encounter (HOSPITAL_COMMUNITY): Payer: Self-pay

## 2014-12-15 ENCOUNTER — Encounter (HOSPITAL_COMMUNITY)
Admission: RE | Admit: 2014-12-15 | Discharge: 2014-12-15 | Disposition: A | Discharge status: Home / Self Care | Payer: 59 | Source: Ambulatory Visit | Attending: Neurosurgery | Admitting: Neurosurgery

## 2014-12-15 HISTORY — DX: Cerebral infarction, unspecified: I63.9

## 2014-12-15 LAB — CBC WITH DIFFERENTIAL/PLATELET
Basophils Absolute: 0 10*3/uL (ref 0.0–0.1)
Basophils Relative: 0 % (ref 0–1)
EOS PCT: 1 % (ref 0–5)
Eosinophils Absolute: 0.1 10*3/uL (ref 0.0–0.7)
HEMATOCRIT: 41.6 % (ref 39.0–52.0)
Hemoglobin: 14 g/dL (ref 13.0–17.0)
LYMPHS ABS: 1.3 10*3/uL (ref 0.7–4.0)
Lymphocytes Relative: 20 % (ref 12–46)
MCH: 30.5 pg (ref 26.0–34.0)
MCHC: 33.7 g/dL (ref 30.0–36.0)
MCV: 90.6 fL (ref 78.0–100.0)
MONOS PCT: 11 % (ref 3–12)
Monocytes Absolute: 0.7 10*3/uL (ref 0.1–1.0)
NEUTROS PCT: 68 % (ref 43–77)
Neutro Abs: 4.5 10*3/uL (ref 1.7–7.7)
Platelets: 171 10*3/uL (ref 150–400)
RBC: 4.59 MIL/uL (ref 4.22–5.81)
RDW: 13 % (ref 11.5–15.5)
WBC: 6.6 10*3/uL (ref 4.0–10.5)

## 2014-12-15 LAB — BASIC METABOLIC PANEL
Anion gap: 6 (ref 5–15)
BUN: 12 mg/dL (ref 6–23)
CHLORIDE: 104 mmol/L (ref 96–112)
CO2: 29 mmol/L (ref 19–32)
CREATININE: 0.93 mg/dL (ref 0.50–1.35)
Calcium: 9.3 mg/dL (ref 8.4–10.5)
Glucose, Bld: 99 mg/dL (ref 70–99)
POTASSIUM: 4.2 mmol/L (ref 3.5–5.1)
Sodium: 139 mmol/L (ref 135–145)

## 2014-12-17 MED ORDER — CEFAZOLIN SODIUM-DEXTROSE 2-3 GM-% IV SOLR
2.0000 g | INTRAVENOUS | Status: AC
  Administered 2014-12-18: 2 g via INTRAVENOUS
  Filled 2014-12-17: qty 50

## 2014-12-17 MED ORDER — DEXAMETHASONE SODIUM PHOSPHATE 10 MG/ML IJ SOLN
10.0000 mg | INTRAMUSCULAR | Status: DC
  Filled 2014-12-17: qty 1

## 2014-12-18 ENCOUNTER — Encounter (HOSPITAL_COMMUNITY)
Admission: RE | Disposition: A | Discharge status: Home / Self Care | Payer: Self-pay | Source: Ambulatory Visit | Attending: Neurosurgery

## 2014-12-18 ENCOUNTER — Inpatient Hospital Stay (HOSPITAL_COMMUNITY)
Admission: RE | Admit: 2014-12-18 | Discharge: 2014-12-18 | DRG: 033 | Disposition: A | Discharge status: Home / Self Care | Payer: 59 | Source: Ambulatory Visit | Attending: Neurosurgery | Admitting: Neurosurgery

## 2014-12-18 ENCOUNTER — Inpatient Hospital Stay (HOSPITAL_COMMUNITY): Payer: 59 | Admitting: Certified Registered Nurse Anesthetist

## 2014-12-18 ENCOUNTER — Encounter (HOSPITAL_COMMUNITY): Payer: Self-pay | Admitting: *Deleted

## 2014-12-18 DIAGNOSIS — G910 Communicating hydrocephalus: Principal | ICD-10-CM | POA: Diagnosis present

## 2014-12-18 DIAGNOSIS — Z8673 Personal history of transient ischemic attack (TIA), and cerebral infarction without residual deficits: Secondary | ICD-10-CM | POA: Diagnosis not present

## 2014-12-18 DIAGNOSIS — E780 Pure hypercholesterolemia: Secondary | ICD-10-CM | POA: Diagnosis present

## 2014-12-18 DIAGNOSIS — Z79899 Other long term (current) drug therapy: Secondary | ICD-10-CM | POA: Diagnosis not present

## 2014-12-18 DIAGNOSIS — Z7902 Long term (current) use of antithrombotics/antiplatelets: Secondary | ICD-10-CM

## 2014-12-18 DIAGNOSIS — R51 Headache: Secondary | ICD-10-CM | POA: Diagnosis present

## 2014-12-18 HISTORY — PX: VENTRICULOPERITONEAL SHUNT: SHX204

## 2014-12-18 SURGERY — SHUNT INSERTION VENTRICULAR-PERITONEAL
Anesthesia: General | Site: Head | Laterality: Right

## 2014-12-18 MED ORDER — SODIUM CHLORIDE 0.9 % IJ SOLN: 3.0000 mL | INTRAMUSCULAR | Status: DC | PRN

## 2014-12-18 MED ORDER — ACETAMINOPHEN 650 MG RE SUPP: 650.0000 mg | RECTAL | Status: DC | PRN

## 2014-12-18 MED ORDER — MIDAZOLAM HCL 2 MG/2ML IJ SOLN
INTRAMUSCULAR | Status: AC
  Filled 2014-12-18: qty 2

## 2014-12-18 MED ORDER — DEXAMETHASONE SODIUM PHOSPHATE 10 MG/ML IJ SOLN
INTRAMUSCULAR | Status: DC | PRN
  Administered 2014-12-18: 8 mg via INTRAVENOUS

## 2014-12-18 MED ORDER — THROMBIN 5000 UNITS EX SOLR
CUTANEOUS | Status: DC | PRN
Start: 2014-12-18 — End: 2014-12-18
  Administered 2014-12-18 (×2): 5000 [IU] via TOPICAL

## 2014-12-18 MED ORDER — FENTANYL CITRATE 0.05 MG/ML IJ SOLN
INTRAMUSCULAR | Status: DC | PRN
  Administered 2014-12-18: 100 ug via INTRAVENOUS
  Administered 2014-12-18: 50 ug via INTRAVENOUS

## 2014-12-18 MED ORDER — DEXTROSE 5 % IV SOLN
10.0000 mg | INTRAVENOUS | Status: DC | PRN
  Administered 2014-12-18: 10 ug/min via INTRAVENOUS

## 2014-12-18 MED ORDER — PROMETHAZINE HCL 25 MG/ML IJ SOLN
6.2500 mg | INTRAMUSCULAR | Status: DC | PRN
Start: 2014-12-18 — End: 2014-12-18

## 2014-12-18 MED ORDER — NEOSTIGMINE METHYLSULFATE 10 MG/10ML IV SOLN
INTRAVENOUS | Status: DC | PRN
  Administered 2014-12-18: 3 mg via INTRAVENOUS

## 2014-12-18 MED ORDER — PROPOFOL 10 MG/ML IV BOLUS
INTRAVENOUS | Status: AC
  Filled 2014-12-18: qty 20

## 2014-12-18 MED ORDER — SENNA 8.6 MG PO TABS: 1.0000 | ORAL_TABLET | Freq: Two times a day (BID) | ORAL | Status: DC

## 2014-12-18 MED ORDER — HYDROMORPHONE HCL 1 MG/ML IJ SOLN: 0.5000 mg | INTRAMUSCULAR | Status: DC | PRN

## 2014-12-18 MED ORDER — HYDROCODONE-ACETAMINOPHEN 5-325 MG PO TABS: 1.0000 | ORAL_TABLET | ORAL | Status: DC | PRN

## 2014-12-18 MED ORDER — HEMOSTATIC AGENTS (NO CHARGE) OPTIME
TOPICAL | Status: DC | PRN
  Administered 2014-12-18: 1 via TOPICAL

## 2014-12-18 MED ORDER — LACTATED RINGERS IV SOLN
INTRAVENOUS | Status: DC | PRN
  Administered 2014-12-18: 07:00:00 via INTRAVENOUS

## 2014-12-18 MED ORDER — ROCURONIUM BROMIDE 100 MG/10ML IV SOLN
INTRAVENOUS | Status: DC | PRN
Start: 2014-12-18 — End: 2014-12-18
  Administered 2014-12-18: 50 mg via INTRAVENOUS

## 2014-12-18 MED ORDER — PHENOL 1.4 % MT LIQD: 1.0000 | OROMUCOSAL | Status: DC | PRN

## 2014-12-18 MED ORDER — FENTANYL CITRATE 0.05 MG/ML IJ SOLN
INTRAMUSCULAR | Status: AC
  Filled 2014-12-18: qty 5

## 2014-12-18 MED ORDER — SODIUM CHLORIDE 0.9 % IJ SOLN: 3.0000 mL | Freq: Two times a day (BID) | INTRAMUSCULAR | Status: DC

## 2014-12-18 MED ORDER — HYDROMORPHONE HCL 1 MG/ML IJ SOLN
INTRAMUSCULAR | Status: AC
  Filled 2014-12-18: qty 1

## 2014-12-18 MED ORDER — LIDOCAINE HCL (CARDIAC) 20 MG/ML IV SOLN
INTRAVENOUS | Status: DC | PRN
Start: 2014-12-18 — End: 2014-12-18
  Administered 2014-12-18: 20 mg via INTRAVENOUS

## 2014-12-18 MED ORDER — ALUM & MAG HYDROXIDE-SIMETH 200-200-20 MG/5ML PO SUSP: 30.0000 mL | Freq: Four times a day (QID) | ORAL | Status: DC | PRN

## 2014-12-18 MED ORDER — ONDANSETRON HCL 4 MG/2ML IJ SOLN
INTRAMUSCULAR | Status: DC | PRN
  Administered 2014-12-18: 4 mg via INTRAVENOUS

## 2014-12-18 MED ORDER — PHENYLEPHRINE HCL 10 MG/ML IJ SOLN
INTRAMUSCULAR | Status: DC | PRN
Start: 2014-12-18 — End: 2014-12-18
  Administered 2014-12-18 (×3): 40 ug via INTRAVENOUS

## 2014-12-18 MED ORDER — ONDANSETRON HCL 4 MG/2ML IJ SOLN: 4.0000 mg | INTRAMUSCULAR | Status: DC | PRN

## 2014-12-18 MED ORDER — MENTHOL 3 MG MT LOZG: 1.0000 | LOZENGE | OROMUCOSAL | Status: DC | PRN

## 2014-12-18 MED ORDER — CLOPIDOGREL BISULFATE 75 MG PO TABS: 75.0000 mg | ORAL_TABLET | Freq: Every day | ORAL | Status: DC

## 2014-12-18 MED ORDER — ATORVASTATIN CALCIUM 40 MG PO TABS: 40.0000 mg | ORAL_TABLET | Freq: Every day | ORAL | Status: DC

## 2014-12-18 MED ORDER — CYCLOBENZAPRINE HCL 10 MG PO TABS: 10.0000 mg | ORAL_TABLET | Freq: Three times a day (TID) | ORAL | Status: DC | PRN

## 2014-12-18 MED ORDER — PROPOFOL 10 MG/ML IV BOLUS
INTRAVENOUS | Status: DC | PRN
  Administered 2014-12-18: 130 mg via INTRAVENOUS

## 2014-12-18 MED ORDER — CEFAZOLIN SODIUM 1-5 GM-% IV SOLN
1.0000 g | Freq: Three times a day (TID) | INTRAVENOUS | Status: DC
  Administered 2014-12-18: 1 g via INTRAVENOUS
  Filled 2014-12-18 (×2): qty 50

## 2014-12-18 MED ORDER — OXYCODONE-ACETAMINOPHEN 5-325 MG PO TABS: 1.0000 | ORAL_TABLET | ORAL | Status: DC | PRN

## 2014-12-18 MED ORDER — 0.9 % SODIUM CHLORIDE (POUR BTL) OPTIME
TOPICAL | Status: DC | PRN
  Administered 2014-12-18: 1000 mL

## 2014-12-18 MED ORDER — SODIUM CHLORIDE 0.9 % IV SOLN: 250.0000 mL | INTRAVENOUS | Status: DC

## 2014-12-18 MED ORDER — MIDAZOLAM HCL 2 MG/2ML IJ SOLN
0.5000 mg | Freq: Once | INTRAMUSCULAR | Status: DC | PRN
Start: 2014-12-18 — End: 2014-12-18

## 2014-12-18 MED ORDER — GLYCOPYRROLATE 0.2 MG/ML IJ SOLN
INTRAMUSCULAR | Status: DC | PRN
Start: 2014-12-18 — End: 2014-12-18
  Administered 2014-12-18: .4 mg via INTRAVENOUS

## 2014-12-18 MED ORDER — MIDAZOLAM HCL 5 MG/5ML IJ SOLN
INTRAMUSCULAR | Status: DC | PRN
  Administered 2014-12-18: 1 mg via INTRAVENOUS

## 2014-12-18 MED ORDER — HYDROMORPHONE HCL 1 MG/ML IJ SOLN
0.2500 mg | INTRAMUSCULAR | Status: DC | PRN
  Administered 2014-12-18: 0.25 mg via INTRAVENOUS

## 2014-12-18 MED ORDER — MEPERIDINE HCL 25 MG/ML IJ SOLN: 6.2500 mg | INTRAMUSCULAR | Status: DC | PRN

## 2014-12-18 MED ORDER — ACETAMINOPHEN 325 MG PO TABS: 650.0000 mg | ORAL_TABLET | ORAL | Status: DC | PRN

## 2014-12-18 SURGICAL SUPPLY — 67 items
BAG DECANTER FOR FLEXI CONT (MISCELLANEOUS) ×3 IMPLANT
BANDAGE ADH SHEER 1  50/CT (GAUZE/BANDAGES/DRESSINGS) ×9 IMPLANT
BENZOIN TINCTURE PRP APPL 2/3 (GAUZE/BANDAGES/DRESSINGS) ×3 IMPLANT
BLADE SURG 11 STRL SS (BLADE) ×3 IMPLANT
BNDG GAUZE ELAST 4 BULKY (GAUZE/BANDAGES/DRESSINGS) IMPLANT
BRUSH SCRUB EZ 1% IODOPHOR (MISCELLANEOUS) ×3 IMPLANT
BRUSH SCRUB EZ PLAIN DRY (MISCELLANEOUS) ×3 IMPLANT
BUR ACORN 6.0 PRECISION (BURR) ×2 IMPLANT
BUR ACORN 6.0MM PRECISION (BURR) ×1
CANISTER SUCT 3000ML (MISCELLANEOUS) ×3 IMPLANT
CATH VENTRICULAR 9CM (CATHETERS) ×6 IMPLANT
CLIP RANEY DISP (INSTRUMENTS) IMPLANT
CONT SPEC 4OZ CLIKSEAL STRL BL (MISCELLANEOUS) IMPLANT
DRAPE INCISE IOBAN 85X60 (DRAPES) ×3 IMPLANT
DRAPE ORTHO SPLIT 77X108 STRL (DRAPES) ×4
DRAPE POUCH INSTRU U-SHP 10X18 (DRAPES) ×3 IMPLANT
DRAPE SURG 17X23 STRL (DRAPES) IMPLANT
DRAPE SURG ORHT 6 SPLT 77X108 (DRAPES) ×2 IMPLANT
DRSG OPSITE POSTOP 3X4 (GAUZE/BANDAGES/DRESSINGS) ×3 IMPLANT
DRSG TELFA 3X8 NADH (GAUZE/BANDAGES/DRESSINGS) ×3 IMPLANT
ELECT REM PT RETURN 9FT ADLT (ELECTROSURGICAL) ×3
ELECTRODE REM PT RTRN 9FT ADLT (ELECTROSURGICAL) ×1 IMPLANT
GAUZE SPONGE 4X4 16PLY XRAY LF (GAUZE/BANDAGES/DRESSINGS) IMPLANT
GLOVE ECLIPSE 9.0 STRL (GLOVE) ×3 IMPLANT
GLOVE EXAM NITRILE LRG STRL (GLOVE) IMPLANT
GLOVE EXAM NITRILE MD LF STRL (GLOVE) IMPLANT
GLOVE EXAM NITRILE XL STR (GLOVE) IMPLANT
GLOVE EXAM NITRILE XS STR PU (GLOVE) IMPLANT
GOWN STRL REUS W/ TWL LRG LVL3 (GOWN DISPOSABLE) IMPLANT
GOWN STRL REUS W/ TWL XL LVL3 (GOWN DISPOSABLE) IMPLANT
GOWN STRL REUS W/TWL 2XL LVL3 (GOWN DISPOSABLE) IMPLANT
GOWN STRL REUS W/TWL LRG LVL3 (GOWN DISPOSABLE)
GOWN STRL REUS W/TWL XL LVL3 (GOWN DISPOSABLE)
HEMOSTAT SURGICEL 2X14 (HEMOSTASIS) IMPLANT
KIT BASIN OR (CUSTOM PROCEDURE TRAY) ×3 IMPLANT
KIT ROOM TURNOVER OR (KITS) ×3 IMPLANT
NEEDLE BLUNT 16X1.5 OR ONLY (NEEDLE) IMPLANT
NS IRRIG 1000ML POUR BTL (IV SOLUTION) ×3 IMPLANT
PACK LAMINECTOMY NEURO (CUSTOM PROCEDURE TRAY) ×3 IMPLANT
PAD ARMBOARD 7.5X6 YLW CONV (MISCELLANEOUS) ×3 IMPLANT
RUBBERBAND STERILE (MISCELLANEOUS) IMPLANT
SHEATH PERITONEAL INTRO 46 (MISCELLANEOUS) IMPLANT
SHEATH PERITONEAL INTRO 61 (MISCELLANEOUS) ×3 IMPLANT
SPONGE INTESTINAL PEANUT (DISPOSABLE) IMPLANT
SPONGE LAP 4X18 X RAY DECT (DISPOSABLE) IMPLANT
SPONGE SURGIFOAM ABS GEL SZ50 (HEMOSTASIS) IMPLANT
STAPLER VISISTAT 35W (STAPLE) ×3 IMPLANT
SUT CHROMIC 3 0 SH 27 (SUTURE) IMPLANT
SUT ETHILON 3 0 FSL (SUTURE) IMPLANT
SUT ETHILON 4 0 PS 2 18 (SUTURE) IMPLANT
SUT NURALON 4 0 TR CR/8 (SUTURE) IMPLANT
SUT SILK 0 TIES 10X30 (SUTURE) IMPLANT
SUT SILK 2 0 TIES 17X18 (SUTURE) ×2
SUT SILK 2-0 18XBRD TIE BLK (SUTURE) ×1 IMPLANT
SUT SILK 3 0 SH 30 (SUTURE) IMPLANT
SUT VIC AB 2-0 CT2 18 VCP726D (SUTURE) ×3 IMPLANT
SUT VIC AB 3-0 SH 8-18 (SUTURE) ×3 IMPLANT
SUT VICRYL 4-0 PS2 18IN ABS (SUTURE) IMPLANT
SYR 5ML LL (SYRINGE) IMPLANT
SYR CONTROL 10ML LL (SYRINGE) ×3 IMPLANT
Snap Shunt 9 cm ×3 IMPLANT
TAPE STRIPS DRAPE STRL (GAUZE/BANDAGES/DRESSINGS) ×3 IMPLANT
TOWEL OR 17X24 6PK STRL BLUE (TOWEL DISPOSABLE) ×3 IMPLANT
TOWEL OR 17X26 10 PK STRL BLUE (TOWEL DISPOSABLE) ×3 IMPLANT
TRAY FOLEY CATH 14FRSI W/METER (CATHETERS) IMPLANT
UNDERPAD 30X30 INCONTINENT (UNDERPADS AND DIAPERS) ×3 IMPLANT
WATER STERILE IRR 1000ML POUR (IV SOLUTION) ×3 IMPLANT

## 2014-12-18 NOTE — Discharge Summary (Signed)
Physician Discharge Summary  Patient ID: Charles Marquez MRN: [id number] DOB/AGE: [birth year] 59 y.o.  Admit date: 12/18/2014 Discharge date: 12/18/2014  Admission Diagnoses:  Discharge Diagnoses:  Principal Problem:   Communicating hydrocephalus   Discharged Condition: good  Hospital Course: Very well. No headache. No incisional pain. Up ambulating without difficulty. Ready for discharge home.  Consults:   Significant Diagnostic Studies:   Treatments:   Discharge Exam: Blood pressure 138/87, pulse 64, temperature 98.4 F (36.9 C), temperature source Oral, resp. rate 16, SpO2 98 %. Awake and alert. Oriented and appropriate. Motor and sensory function intact. Cranial nerve function intact. Wounds clean and dry. Abdomen soft.  Disposition: 01-Home or Self Care     Medication List    TAKE these medications        atorvastatin 40 MG tablet  Commonly known as:  LIPITOR  Take 1 tablet (40 mg total) by mouth daily.     clopidogrel 75 MG tablet  Commonly known as:  PLAVIX  Take 1 tablet (75 mg total) by mouth daily.  Start taking on:  12/21/2014     EPINEPHrine 0.3 mg/0.3 mL Soaj injection  Commonly known as:  EPI-PEN  Inject 0.3 mLs (0.3 mg total) into the muscle once.     HYDROcodone-acetaminophen 5-325 MG per tablet  Commonly known as:  NORCO/VICODIN  Take 1-2 tablets by mouth every 4 (four) hours as needed for moderate pain.           Follow-up Information    Follow up with Charles Marquez,Cade Olberding A, MD.   Specialty:  Neurosurgery   Contact information:   [address] [place] [address] [phone]       Signed: Temple PaciniOOL,Kevin Space A 12/18/2014, 4:56 PM

## 2014-12-18 NOTE — Transfer of Care (Signed)
Immediate Anesthesia Transfer of Care Note  Patient: Charles Marquez  Procedure(s) Performed: Procedure(s) with comments: Shunt Placment - right occipital VP shunt  (Right) - Shunt Placment - right occipital VP shunt   Patient Location: PACU  Anesthesia Type:General  Level of Consciousness: awake, alert  and oriented  Airway & Oxygen Therapy: Patient Spontanous Breathing and Patient connected to nasal cannula oxygen  Post-op Assessment: Report given to RN and Post -op Vital signs reviewed and stable  Post vital signs: Reviewed and stable  Last Vitals:  Filed Vitals:   12/18/14 0616  BP: 139/74  Pulse: 61  Temp: 36.7 C  Resp: 18    Complications: No apparent anesthesia complications

## 2014-12-18 NOTE — Progress Notes (Signed)
Waiting on Morrie SheldonAshley RN (3c) to call back for report.

## 2014-12-18 NOTE — H&P (Signed)
Charles Marquez is an 59 y.o. male.   Chief Complaint: Headache HPI: 59 year old male with intermittent severe headaches and associated nausea vomiting. 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 Patient has no mass lesion or obvious obstruction. Patient underwent lumbar puncture with diagnosis of significantly elevated intracranial pressure. Patient presents now for ventriculoperitoneal shunting  Past Medical History  Diagnosis Date  . TIA (transient ischemic attack)     09.15  . Hypercholesteremia   . Stroke     tia's  . Headache     Past Surgical History  Procedure Laterality Date  . Fractured arm Left 12  . Hernia repair Right 3/12  . Vasectomy      History reviewed. No pertinent family history. Social History:  reports that he has never smoked. He has never used smokeless tobacco. He reports that he drinks alcohol. He reports that he does not use illicit drugs.  Allergies: No Known Allergies  Medications Prior to Admission  Medication Sig Dispense Refill  . atorvastatin (LIPITOR) 40 MG tablet Take 1 tablet (40 mg total) by mouth daily. 90 tablet 3  . clopidogrel (PLAVIX) 75 MG tablet Take 1 tablet (75 mg total) by mouth daily. 30 tablet 2  . EPINEPHrine (EPI-PEN) 0.3 mg/0.3 mL SOAJ Inject 0.3 mLs (0.3 mg total) into the muscle once. 2 Device 3    No results found for this or any previous visit (from the past 48 hour(s)). No results found.  Review of Systems  Constitutional: Negative.   Eyes: Negative.   Respiratory: Negative.   Cardiovascular: Negative.   Skin: Negative.   All other systems reviewed and are negative.   Blood pressure 139/74, pulse 61, temperature 98.1 F (36.7 C), temperature source Oral, resp. rate 18, SpO2 98 %. Physical Exam  Constitutional: He is oriented to person, place, and time. He appears well-developed and well-nourished. No distress.   HENT:  Head: Normocephalic and atraumatic.  Right Ear: External ear normal.  Left Ear: External ear normal.  Nose: Nose normal.  Mouth/Throat: Oropharynx is clear and moist. No oropharyngeal exudate.  Eyes: Conjunctivae and EOM are normal. Pupils are equal, round, and reactive to light.  Neck: Normal range of motion. Neck supple. No tracheal deviation present. No thyromegaly present.  Cardiovascular: Normal rate, regular rhythm, normal heart sounds and intact distal pulses.  Exam reveals no friction rub.   No murmur heard. Respiratory: Effort normal and breath sounds normal. No respiratory distress. He has no wheezes.  GI: Soft. Bowel sounds are normal. He exhibits no distension. There is no tenderness.  Musculoskeletal: Normal range of motion. He exhibits no edema or tenderness.  Neurological: He is alert and oriented to person, place, and time. He has normal reflexes. No cranial nerve deficit. Coordination normal.  Skin: Skin is warm and dry. No rash noted. He is not diaphoretic. No erythema. No pallor.  Psychiatric: He has a normal mood and affect. His behavior is normal. Judgment and thought content normal.     Assessment/Plan Communicating hydrocephalus. Plan right occipital ventriculoperitoneal shunt. Risks and benefits of been explained. Patient wishes to proceed.  Lalla Laham A 12/18/2014, 7:39 AM

## 2014-12-18 NOTE — Op Note (Signed)
Date of procedure: 12/18/2014  Date of dictation: Same  Service: Neurosurgery  Preoperative diagnosis: Communicating hydrocephalus  Postoperative diagnosis: Same  Procedure Name: Right occipital ventriculoperitoneal shunt  Surgeon:Meganne Rita A.Selden Noteboom, M.D.  Asst. Surgeon: None  Anesthesia: General  Indication: 59 year old male with progressive ventricle megaly and associated headaches. Workup demonstrated significantly increased intracranial pressure. Plan VP shunt.  Operative note: After induction of anesthesia, patient position in the supine position with his neck turned towards the left and his right shoulder elevated. Patient's right occipital region neck chest and abdomen were prepped and draped sterilely. Incision was made in the right. Midline of his upper abdomen. This carried down sharply to the anterior rectus sheath. Anterior rectus sheath was divided. Rectus muscles were separated and the posterior rectus sheath was grasped elevated and incised. Peritoneum was entered bluntly. Attention was then placed to the cranial wound. An incision was made in the right occipital region. This is carried down sharply to the paracranium. Self retainer retractor was placed. Bur hole was made. Dura was coagulated. Pocket inferior to the wound was then made using a clamp for later placement of the shunt valve. Shunt passer was introduced and passed from the cranial wound to the abdominal wound. A strata programmable valve was set to level I.0. The valve and integral distal catheter assembly were then passed into position using the shunt passer. The shunt tube was exited through the abdominal wound. Dura was coagulated and incised. A 9 cm straight ventricular catheter was then passed into the lateral ventricle without difficulty with good return of clear CSF under marked pressure. This visit is attached to the proximal aspect of the shunt system. Good flow of CSF was observed through the distal catheter.  Distal catheter was then passed into the abdominal cavity. Both wounds were irrigated and then closed in a typical fashion. Sterile dressings were applied. No apparent Complications. The patient tolerated the procedure well and returns to the recovery room postop.

## 2014-12-18 NOTE — Anesthesia Postprocedure Evaluation (Signed)
  Anesthesia Post-op Note  Patient: Charles Marquez  Procedure(s) Performed: Procedure(s) with comments: Shunt Placment - right occipital VP shunt  (Right) - Shunt Placment - right occipital VP shunt   Patient Location: PACU  Anesthesia Type:General  Level of Consciousness: awake, alert , oriented and patient cooperative  Airway and Oxygen Therapy: Patient Spontanous Breathing  Post-op Pain: none  Post-op Assessment: Post-op Vital signs reviewed, Patient's Cardiovascular Status Stable, Respiratory Function Stable, Patent Airway, No signs of Nausea or vomiting and Pain level controlled  Post-op Vital Signs: Reviewed and stable  Last Vitals:  Filed Vitals:   12/18/14 0950  BP:   Pulse:   Temp: 36.5 C  Resp:     Complications: No apparent anesthesia complications

## 2014-12-18 NOTE — Anesthesia Preprocedure Evaluation (Signed)
Anesthesia Evaluation  Patient identified by MRN, date of birth, ID band Patient awake    Reviewed: Allergy & Precautions, NPO status , Patient's Chart, lab work & pertinent test results  History of Anesthesia Complications Negative for: history of anesthetic complications  Airway Mallampati: II  TM Distance: >3 FB Neck ROM: Full    Dental  (+) Teeth Intact, Caps, Missing, Dental Advisory Given   Pulmonary neg pulmonary ROS,  breath sounds clear to auscultation        Cardiovascular negative cardio ROS  Rhythm:Regular Rate:Normal     Neuro/Psych TIA   GI/Hepatic negative GI ROS, Neg liver ROS,   Endo/Other  negative endocrine ROS  Renal/GU negative Renal ROS     Musculoskeletal   Abdominal   Peds  Hematology   Anesthesia Other Findings   Reproductive/Obstetrics                             Anesthesia Physical Anesthesia Plan  ASA: III  Anesthesia Plan: General   Post-op Pain Management:    Induction: Intravenous  Airway Management Planned: Oral ETT  Additional Equipment:   Intra-op Plan:   Post-operative Plan: Extubation in OR  Informed Consent: I have reviewed the patients History and Physical, chart, labs and discussed the procedure including the risks, benefits and alternatives for the proposed anesthesia with the patient or authorized representative who has indicated his/her understanding and acceptance.   Dental advisory given  Plan Discussed with: CRNA and Surgeon  Anesthesia Plan Comments: (Plan routine monitors, GETA)        Anesthesia Quick Evaluation

## 2014-12-18 NOTE — Progress Notes (Signed)
Pt and wife given D/C instructions with Rx's, verbal understanding was provided. [name] incision is clean and dry with no sign of infection. [name] IV was removed prior to D/C. Pt D/C'd home via wheelchair @ 1840 per MD order. Pt is stable @ D/C and has no other needs at this time. Rema FendtAshley Riyanna Crutchley, RN

## 2014-12-18 NOTE — Brief Op Note (Signed)
12/18/2014  8:40 AM  PATIENT:  Charles Marquez  59 y.o. male  PRE-OPERATIVE DIAGNOSIS:  communicating hydrocephalus  POST-OPERATIVE DIAGNOSIS:  communicating hydrocephalus  PROCEDURE:  Procedure(s) with comments: Shunt Placment - right occipital VP shunt  (Right) - Shunt Placment - right occipital VP shunt   SURGEON:  Surgeon(s) and Role:    * Temple PaciniHenry A Paulino Cork, MD - Primary  PHYSICIAN ASSISTANT:   ASSISTANTS:    ANESTHESIA:   general  EBL:  Total I/O In: -  Out: 50 [Blood:50]  BLOOD ADMINISTERED:none  DRAINS: none   LOCAL MEDICATIONS USED:  NONE  SPECIMEN:  No Specimen  DISPOSITION OF SPECIMEN:  N/A  COUNTS:  YES  TOURNIQUET:  * No tourniquets in log *  DICTATION: .Dragon Dictation  PLAN OF CARE: Admit to inpatient   PATIENT DISPOSITION:  PACU - hemodynamically stable.   Delay start of Pharmacological VTE agent (>24hrs) due to surgical blood loss or risk of bleeding: yes

## 2014-12-18 NOTE — Anesthesia Procedure Notes (Signed)
Procedure Name: Intubation Date/Time: 12/18/2014 8:13 AM Performed by: Minus LibertyAVENEL, Yona Kosek Pre-anesthesia Checklist: Patient identified, Emergency Drugs available, Suction available and Patient being monitored Patient Re-evaluated:Patient Re-evaluated prior to inductionOxygen Delivery Method: Circle system utilized Preoxygenation: Pre-oxygenation with 100% oxygen Intubation Type: IV induction Ventilation: Mask ventilation without difficulty Laryngoscope Size: Mac and 4 Grade View: Grade III Tube type: Oral Tube size: 7.5 mm Number of attempts: 1 Airway Equipment and Method: Stylet Placement Confirmation: ETT inserted through vocal cords under direct vision,  breath sounds checked- equal and bilateral,  positive ETCO2 and CO2 detector Secured at: 22 cm Tube secured with: Tape Dental Injury: Teeth and Oropharynx as per pre-operative assessment  Comments: Anterior airway

## 2014-12-21 ENCOUNTER — Encounter (HOSPITAL_COMMUNITY): Payer: Self-pay | Admitting: Neurosurgery

## 2015-01-13 ENCOUNTER — Telehealth: Payer: Self-pay | Admitting: Family Medicine

## 2015-01-13 NOTE — Telephone Encounter (Signed)
Pt called to say that his insurance company req 90 day supply of the following med . Dr Jordan LikesPool rx this med for him and he said he contacted them and they would not do it. So he is asking if Dr Tawanna Coolerodd will give him an rx for 90 day supply .    lopidogrel (PLAVIX) 75 MG tablet

## 2015-01-14 MED ORDER — CLOPIDOGREL BISULFATE 75 MG PO TABS: 75.0000 mg | ORAL_TABLET | Freq: Every day | ORAL | Status: DC

## 2015-01-14 NOTE — Telephone Encounter (Signed)
Rx sent 

## 2015-01-25 ENCOUNTER — Encounter: Payer: Self-pay | Admitting: Neurology

## 2015-01-25 ENCOUNTER — Ambulatory Visit (INDEPENDENT_AMBULATORY_CARE_PROVIDER_SITE_OTHER): Payer: 59 | Admitting: Neurology

## 2015-01-25 VITALS — BP 123/80 | HR 104 | Ht 71.0 in | Wt 196.4 lb

## 2015-01-25 DIAGNOSIS — I679 Cerebrovascular disease, unspecified: Secondary | ICD-10-CM | POA: Diagnosis not present

## 2015-01-25 NOTE — Patient Instructions (Signed)
I had a long d/w patient about his recent TIA risk for recurrent stroke/TIAs, personally independently reviewed imaging studies and stroke evaluation results and answered questions.Continue Plavix  for secondary stroke prevention and maintain strict control of hypertension with blood pressure goal below 130/90, diabetes with hemoglobin A1c goal below 6.5% and lipids with LDL cholesterol goal below 100 mg/dL. I also advised the patient to eat a healthy diet with plenty of whole grains, cereals, fruits and vegetables, exercise regularly and maintain ideal body weight. Continue follow-up with Dr. Julio SicksHenry Pool for VP shunt Followup in the future with me in 6 months or call earlier if necessary.  Stroke Prevention Some medical conditions and behaviors are associated with an increased chance of having a stroke. You may prevent a stroke by making healthy choices and managing medical conditions. HOW CAN I REDUCE MY RISK OF HAVING A STROKE?   Stay physically active. Get at least 30 minutes of activity on most or all days.  Do not smoke. It may also be helpful to avoid exposure to secondhand smoke.  Limit alcohol use. Moderate alcohol use is considered to be:  No more than 2 drinks per day for men.  No more than 1 drink per day for nonpregnant women.  Eat healthy foods. This involves:  Eating 5 or more servings of fruits and vegetables a day.  Making dietary changes that address high blood pressure (hypertension), high cholesterol, diabetes, or obesity.  Manage your cholesterol levels.  Making food choices that are high in fiber and low in saturated fat, trans fat, and cholesterol may control cholesterol levels.  Take any prescribed medicines to control cholesterol as directed by your health care provider.  Manage your diabetes.  Controlling your carbohydrate and sugar intake is recommended to manage diabetes.  Take any prescribed medicines to control diabetes as directed by your health care  provider.  Control your hypertension.  Making food choices that are low in salt (sodium), saturated fat, trans fat, and cholesterol is recommended to manage hypertension.  Take any prescribed medicines to control hypertension as directed by your health care provider.  Maintain a healthy weight.  Reducing calorie intake and making food choices that are low in sodium, saturated fat, trans fat, and cholesterol are recommended to manage weight.  Stop drug abuse.  Avoid taking birth control pills.  Talk to your health care provider about the risks of taking birth control pills if you are over 57 years old, smoke, get migraines, or have ever had a blood clot.  Get evaluated for sleep disorders (sleep apnea).  Talk to your health care provider about getting a sleep evaluation if you snore a lot or have excessive sleepiness.  Take medicines only as directed by your health care provider.  For some people, aspirin or blood thinners (anticoagulants) are helpful in reducing the risk of forming abnormal blood clots that can lead to stroke. If you have the irregular heart rhythm of atrial fibrillation, you should be on a blood thinner unless there is a good reason you cannot take them.  Understand all your medicine instructions.  Make sure that other conditions (such as anemia or atherosclerosis) are addressed. SEEK IMMEDIATE MEDICAL CARE IF:   You have sudden weakness or numbness of the face, arm, or leg, especially on one side of the body.  Your face or eyelid droops to one side.  You have sudden confusion.  You have trouble speaking (aphasia) or understanding.  You have sudden trouble seeing in one or  both eyes.  You have sudden trouble walking.  You have dizziness.  You have a loss of balance or coordination.  You have a sudden, severe headache with no known cause.  You have new chest pain or an irregular heartbeat. Any of these symptoms may represent a serious problem that is  an emergency. Do not wait to see if the symptoms will go away. Get medical help at once. Call your local emergency services (911 in [place] Do not drive yourself to the hospital. Document Released: 12/07/2004 Document Revised: 03/16/2014 Document Reviewed: 05/02/2013 [facility] Patient Information 2015 [place], [place]. This information is not intended to replace advice given to you by your health care provider. Make sure you discuss any questions you have with your health care provider.

## 2015-01-25 NOTE — Progress Notes (Signed)
[facility] [address] [place]. [place] [id number] [phone]       OFFICE FOLLOW-UP NOTE  Charles Marquez Date of Birth:  07/15/1956 Medical Record Number:  [id number]   HPI: [id number] year Caucasian male seen today for first office follow-up visit following hospital admission for TIA on 10/19/14. He presented with sudden onset of tingling involving the right side of the face as well as right fingers. He also saw some floaters earlier on the day in his vision. He denied any complaint headache. He had some difficulty speaking with some garbled words. His symptoms are transient and improved by the time he came to the hospital. CT scan of the head as well as an MRI scan showed no acute infarct but showed completely getting hydrocephalus which was apparently a new finding compared to previous scan in 2009. The patient however denied any significant head injury with loss of consciousness, intracranial bleed, meningitis and any clinical symptoms suggestive of hydrocephalus in the form of significant headaches, gait or balance difficulties or urinary incontinence or memory loss. He was seen by neurosurgeon Dr. Lelon PerlaHenry Poole and subsequently had outpatient for elective ventriculoperitoneal shunt placed on 12/18/14. The procedure went well. Patient states he was having some minor headaches prior to the shunt surgery which seem to have now gone. He has had no recurrent stroke or TIA symptoms. He was previously on aspirin which we switched to Plavix which is tolerating well without significant bleeding or bruising. He is also tolerating his statin without any side effects. He had a remote history of TIA in 2009 when he had left body paresthesias and he had seen me at that time and had been started on aspirin. Transthoracic echo, carotid Dopplers, lipid profile  Showed LDL 83 mg% and Hb A1c was 2.1%5.5%. He had some minor abdominal pain in the last week or so but this has been changing sites and he has been  eating well and has had bowel movements. I advised him to watch this pain from now if it gets severe he needs to contact his primary physician  ROS:   14 system review of systems is positive for abdominal pain, tingling and all other systems negative.  PMH:  Past Medical History  Diagnosis Date  . TIA (transient ischemic attack)     09.15  . Hypercholesteremia   . Stroke     tia's  . Headache     Social History:  History   Social History  . Marital Status: Married    Spouse Name: N/A  . Number of Children: 2  . Years of Education: MASTERS   Occupational History  . Not on file.   Social History Main Topics  . Smoking status: Never Smoker   . Smokeless tobacco: Never Used  . Alcohol Use: 0.0 oz/week    0 Standard drinks or equivalent per week     Comment: ocassionally  . Drug Use: No  . Sexual Activity: Not on file   Other Topics Concern  . Not on file   Social History Narrative   Patient is married with 2 children.   Patient is right handed.   Patient has a Master's degree.   Patient drinks 24 oz of soda daily.    Medications:   Current Outpatient Prescriptions on File Prior to Visit  Medication Sig Dispense Refill  . atorvastatin (LIPITOR) 40 MG tablet Take 1 tablet (40 mg total) by mouth daily. 90 tablet 3  . clopidogrel (PLAVIX) 75 MG  tablet Take 1 tablet (75 mg total) by mouth daily. 90 tablet 0  . EPINEPHrine (EPI-PEN) 0.3 mg/0.3 mL SOAJ Inject 0.3 mLs (0.3 mg total) into the muscle once. 2 Device 3  . HYDROcodone-acetaminophen (NORCO/VICODIN) 5-325 MG per tablet Take 1-2 tablets by mouth every 4 (four) hours as needed for moderate pain. 30 tablet 0   No current facility-administered medications on file prior to visit.    Allergies:  No Known Allergies  Physical Exam General: well developed, well nourished, seated, in no evident distress Head: head normocephalic and atraumatic.  Neck: supple with no carotid or supraclavicular bruits Cardiovascular:  regular rate and rhythm, no murmurs Musculoskeletal: no deformity Skin:  no rash/petichiae. Rt occipital subcutaneous VP shunt noted. Vascular:  Normal pulses all extremities Filed Vitals:   01/25/15 0934  BP: 123/80  Pulse: 104   Neurologic Exam Mental Status: Awake and fully alert. Oriented to place and time. Recent and remote memory intact. Attention span, concentration and fund of knowledge appropriate. Mood and affect appropriate.  Cranial Nerves: Fundoscopic exam reveals sharp disc margins. Pupils equal, briskly reactive to light. Extraocular movements full without nystagmus. Visual fields full to confrontation. Hearing intact. Facial sensation intact. Face, tongue, palate moves normally and symmetrically.  Motor: Normal bulk and tone. Normal strength in all tested extremity muscles. Sensory.: intact to touch ,pinprick .position and vibratory sensation.  Coordination: Rapid alternating movements normal in all extremities. Finger-to-nose and heel-to-shin performed accurately bilaterally. Gait and Station: Arises from chair without difficulty. Stance is normal. Gait demonstrates normal stride length and balance . Able to heel, toe and tandem walk without difficulty.  Reflexes: 1+ and symmetric. Toes downgoing.   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 Prior history of right hemispheric TIA in 2009 also likely due to small vessel disease. 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 I also advised the patient to eat a healthy diet with plenty of whole grains, cereals, fruits and vegetables, exercise regularly and maintain ideal body weight. Continue follow-up with Dr. Julio Sicks for VP shunt Followup in the future with me in 6 months or call earlier if necessary.    Note: This document was prepared with digital dictation and possible smart phrase technology. Any transcriptional errors that result from this process are unintentional

## 2015-08-10 ENCOUNTER — Ambulatory Visit (INDEPENDENT_AMBULATORY_CARE_PROVIDER_SITE_OTHER): Payer: 59 | Admitting: Neurology

## 2015-08-10 ENCOUNTER — Encounter: Payer: Self-pay | Admitting: Neurology

## 2015-08-10 VITALS — BP 122/80 | HR 70 | Ht 71.0 in | Wt 199.2 lb

## 2015-08-10 DIAGNOSIS — I6990 Unspecified sequelae of unspecified cerebrovascular disease: Secondary | ICD-10-CM

## 2015-08-10 NOTE — Patient Instructions (Signed)
I had a long d/w patient about his remote TIA, risk for recurrent stroke/TIAs, personally independently reviewed imaging studies and stroke evaluation results and answered questions.Continue Plavix  for secondary stroke prevention and maintain strict control of hypertension with blood pressure goal below 130/90, diabetes with hemoglobin A1c goal below 6.5% and lipids with LDL cholesterol goal below 100 mg/dL. I also advised the patient to eat a healthy diet with plenty of whole grains, cereals, fruits and vegetables, exercise regularly and maintain ideal body weight Followup in the future with me in 1 year or call earlier if necessary

## 2015-08-10 NOTE — Progress Notes (Signed)
[facility] [address] [place]. [place] [address] [phone]       OFFICE FOLLOW-UP NOTE  Charles Marquez Date of Birth:  03-27-1956 Medical Record Number:  [id number]   HPI: 6 year Caucasian male seen today for first office follow-up visit following hospital admission for TIA on 10/19/14. He presented with sudden onset of tingling involving the right side of the face as well as right fingers. He also saw some floaters earlier on the day in his vision. He denied any complaint headache. He had some difficulty speaking with some garbled words. His symptoms are transient and improved by the time he came to the hospital. CT scan of the head as well as an MRI scan showed no acute infarct but showed completely getting hydrocephalus which was apparently a new finding compared to previous scan in 2009. The patient however denied any significant head injury with loss of consciousness, intracranial bleed, meningitis and any clinical symptoms suggestive of hydrocephalus in the form of significant headaches, gait or balance difficulties or urinary incontinence or memory loss. He was seen by neurosurgeon Dr. Lelon Perla and subsequently had outpatient for elective ventriculoperitoneal shunt placed on 12/18/14. The procedure went well. Patient states he was having some minor headaches prior to the shunt surgery which seem to have now gone. He has had no recurrent stroke or TIA symptoms. He was previously on aspirin which we switched to Plavix which is tolerating well without significant bleeding or bruising. He is also tolerating his statin without any side effects. He had a remote history of TIA in 2009 when he had left body paresthesias and he had seen me at that time and had been started on aspirin. Transthoracic echo, carotid Dopplers, lipid profile  Showed LDL 83 mg% and Hb A1c was 2.1%. He had some minor abdominal pain in the last week or so but this has been changing sites and he has been  eating well and has had bowel movements. I advised him to watch this pain from now if it gets severe he needs to contact his primary physician Update 08/10/2015 : He returns for follow-up the last visit 6 months ago. He continues to do well without recurrent neurovascular symptoms. He remains on Plavix which is tolerating well without bleeding, bruising or other side effects. He is also on Lipitor and is not having any significant myalgias or arthralgias. He recently changed his primary physician from Dr. Tawanna Cooler to Dr. Cyndia Bent who did some lab work 2-3 months ago which was apparently unremarkable. Patient has had an upcoming appointment with Dr. Jordan Likes on October 11. He is having some issues with bowel movements and constipation and advise him to discuss this with his primary physician. He has no neurological complaints today. ROS:   14 system review of systems is positive for no complaints today  PMH:  Past Medical History  Diagnosis Date  . TIA (transient ischemic attack)     09.15  . Hypercholesteremia   . Stroke     tia's  . Headache     Social History:  Social History   Social History  . Marital Status: Married    Spouse Name: N/A  . Number of Children: 2  . Years of Education: MASTERS   Occupational History  . Not on file.   Social History Main Topics  . Smoking status: Never Smoker   . Smokeless tobacco: Never Used  . Alcohol Use: 0.0 oz/week    0 Standard drinks or equivalent per  week     Comment: ocassionally  . Drug Use: No  . Sexual Activity: Not on file   Other Topics Concern  . Not on file   Social History Narrative   Patient is married with 2 children.   Patient is right handed.   Patient has a Master's degree.   Patient drinks 24 oz of soda daily.    Medications:   Current Outpatient Prescriptions on File Prior to Visit  Medication Sig Dispense Refill  . atorvastatin (LIPITOR) 40 MG tablet Take 1 tablet (40 mg total) by mouth daily. 90 tablet 3  .  clopidogrel (PLAVIX) 75 MG tablet Take 1 tablet (75 mg total) by mouth daily. 90 tablet 0  . HYDROcodone-acetaminophen (NORCO/VICODIN) 5-325 MG per tablet Take 1-2 tablets by mouth every 4 (four) hours as needed for moderate pain. 30 tablet 0   No current facility-administered medications on file prior to visit.    Allergies:  No Known Allergies  Physical Exam General: well developed, well nourished, seated, in no evident distress Head: head normocephalic and atraumatic.  Neck: supple with no carotid or supraclavicular bruits Cardiovascular: regular rate and rhythm, no murmurs Musculoskeletal: no deformity Skin:  no rash/petichiae. Rt occipital subcutaneous VP shunt noted. Vascular:  Normal pulses all extremities Filed Vitals:   08/10/15 0834  BP: 122/80  Pulse: 70   Neurologic Exam Mental Status: Awake and fully alert. Oriented to place and time. Recent and remote memory intact. Attention span, concentration and fund of knowledge appropriate. Mood and affect appropriate.  Cranial Nerves: Fundoscopic exam not done  . Pupils equal, briskly reactive to light. Extraocular movements full without nystagmus. Visual fields full to confrontation. Hearing intact. Facial sensation intact. Face, tongue, palate moves normally and symmetrically.  Motor: Normal bulk and tone. Normal strength in all tested extremity muscles. Sensory.: intact to touch ,pinprick .position and vibratory sensation.  Coordination: Rapid alternating movements normal in all extremities. Finger-to-nose and heel-to-shin performed accurately bilaterally. Gait and Station: Arises from chair without difficulty. Stance is normal. Gait demonstrates normal stride length and balance . Able to heel, toe and tandem walk without difficulty.  Reflexes: 1+ and symmetric. Toes downgoing.       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 Prior  history of right hemispheric TIA in 2009 also likely due to small vessel disease. 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 I also advised the patient to eat a healthy diet with plenty of whole grains, cereals, fruits and vegetables, exercise regularly and maintain ideal body weight Followup in the future with me in 1 year or call earlier if necessary   Delia Heady, MD  Note: This document was prepared with digital dictation and possible smart phrase technology. Any transcriptional errors that result from this process are unintentional

## 2016-03-13 ENCOUNTER — Emergency Department (HOSPITAL_COMMUNITY): Payer: 59 | Admitting: Certified Registered Nurse Anesthetist

## 2016-03-13 ENCOUNTER — Emergency Department (HOSPITAL_COMMUNITY): Payer: 59

## 2016-03-13 ENCOUNTER — Encounter (HOSPITAL_COMMUNITY): Payer: Self-pay | Admitting: Nurse Practitioner

## 2016-03-13 ENCOUNTER — Inpatient Hospital Stay (HOSPITAL_COMMUNITY)
Admission: EM | Admit: 2016-03-13 | Discharge: 2016-03-16 | DRG: 032 | Disposition: A | Discharge status: Home / Self Care | Payer: 59 | Attending: Neurosurgery | Admitting: Neurosurgery

## 2016-03-13 ENCOUNTER — Encounter (HOSPITAL_COMMUNITY)
Admission: EM | Disposition: A | Discharge status: Home / Self Care | Payer: Self-pay | Source: Home / Self Care | Attending: Neurosurgery

## 2016-03-13 DIAGNOSIS — G910 Communicating hydrocephalus: Secondary | ICD-10-CM | POA: Diagnosis present

## 2016-03-13 DIAGNOSIS — T85618A Breakdown (mechanical) of other specified internal prosthetic devices, implants and grafts, initial encounter: Secondary | ICD-10-CM

## 2016-03-13 DIAGNOSIS — Z79899 Other long term (current) drug therapy: Secondary | ICD-10-CM

## 2016-03-13 DIAGNOSIS — T8502XA Displacement of ventricular intracranial (communicating) shunt, initial encounter: Principal | ICD-10-CM | POA: Diagnosis present

## 2016-03-13 DIAGNOSIS — Z7902 Long term (current) use of antithrombotics/antiplatelets: Secondary | ICD-10-CM | POA: Diagnosis not present

## 2016-03-13 DIAGNOSIS — Z8673 Personal history of transient ischemic attack (TIA), and cerebral infarction without residual deficits: Secondary | ICD-10-CM | POA: Diagnosis not present

## 2016-03-13 DIAGNOSIS — K255 Chronic or unspecified gastric ulcer with perforation: Secondary | ICD-10-CM

## 2016-03-13 DIAGNOSIS — E7800 Pure hypercholesterolemia, unspecified: Secondary | ICD-10-CM | POA: Diagnosis present

## 2016-03-13 DIAGNOSIS — T859XXA Unspecified complication of internal prosthetic device, implant and graft, initial encounter: Secondary | ICD-10-CM | POA: Diagnosis present

## 2016-03-13 DIAGNOSIS — G919 Hydrocephalus, unspecified: Secondary | ICD-10-CM

## 2016-03-13 DIAGNOSIS — Y838 Other surgical procedures as the cause of abnormal reaction of the patient, or of later complication, without mention of misadventure at the time of the procedure: Secondary | ICD-10-CM | POA: Diagnosis present

## 2016-03-13 HISTORY — PX: SHUNT REMOVAL: SHX342

## 2016-03-13 LAB — CBC WITH DIFFERENTIAL/PLATELET
BASOS PCT: 0 %
Basophils Absolute: 0 10*3/uL (ref 0.0–0.1)
EOS ABS: 0 10*3/uL (ref 0.0–0.7)
Eosinophils Relative: 0 %
HEMATOCRIT: 38.8 % — AB (ref 39.0–52.0)
Hemoglobin: 12.7 g/dL — ABNORMAL LOW (ref 13.0–17.0)
Lymphocytes Relative: 17 %
Lymphs Abs: 1.1 10*3/uL (ref 0.7–4.0)
MCH: 30.2 pg (ref 26.0–34.0)
MCHC: 32.7 g/dL (ref 30.0–36.0)
MCV: 92.2 fL (ref 78.0–100.0)
MONO ABS: 0.7 10*3/uL (ref 0.1–1.0)
MONOS PCT: 10 %
Neutro Abs: 4.9 10*3/uL (ref 1.7–7.7)
Neutrophils Relative %: 73 %
Platelets: 184 10*3/uL (ref 150–400)
RBC: 4.21 MIL/uL — ABNORMAL LOW (ref 4.22–5.81)
RDW: 12.7 % (ref 11.5–15.5)
WBC: 6.7 10*3/uL (ref 4.0–10.5)

## 2016-03-13 LAB — BASIC METABOLIC PANEL
Anion gap: 10 (ref 5–15)
BUN: 15 mg/dL (ref 6–20)
CALCIUM: 8.9 mg/dL (ref 8.9–10.3)
CO2: 23 mmol/L (ref 22–32)
CREATININE: 0.94 mg/dL (ref 0.61–1.24)
Chloride: 105 mmol/L (ref 101–111)
GFR calc non Af Amer: >60 mL/min (ref >60)
Glucose, Bld: 104 mg/dL — ABNORMAL HIGH (ref 65–99)
Potassium: 3.8 mmol/L (ref 3.5–5.1)
SODIUM: 138 mmol/L (ref 135–145)

## 2016-03-13 SURGERY — SHUNT REMOVAL
Anesthesia: General | Laterality: Right

## 2016-03-13 MED ORDER — ROCURONIUM BROMIDE 50 MG/5ML IV SOLN
INTRAVENOUS | Status: AC
  Filled 2016-03-13: qty 4

## 2016-03-13 MED ORDER — ATROPINE SULFATE 0.4 MG/ML IV SOSY
PREFILLED_SYRINGE | INTRAVENOUS | Status: AC
  Filled 2016-03-13: qty 5

## 2016-03-13 MED ORDER — FENTANYL CITRATE (PF) 100 MCG/2ML IJ SOLN: INTRAMUSCULAR | Status: DC | PRN

## 2016-03-13 MED ORDER — HYDROMORPHONE HCL 1 MG/ML IJ SOLN
0.5000 mg | INTRAMUSCULAR | Status: DC | PRN
  Administered 2016-03-13: 0.5 mg via INTRAVENOUS
  Administered 2016-03-14: 1 mg via INTRAVENOUS
  Filled 2016-03-13 (×2): qty 1

## 2016-03-13 MED ORDER — KETOROLAC TROMETHAMINE 30 MG/ML IJ SOLN
30.0000 mg | Freq: Four times a day (QID) | INTRAMUSCULAR | Status: DC
  Administered 2016-03-14 – 2016-03-15 (×5): 30 mg via INTRAVENOUS
  Filled 2016-03-13 (×7): qty 1

## 2016-03-13 MED ORDER — SODIUM CHLORIDE 0.9 % IR SOLN
Status: DC | PRN
  Administered 2016-03-13: 20:00:00

## 2016-03-13 MED ORDER — OXYCODONE HCL 5 MG PO TABS: 5.0000 mg | ORAL_TABLET | Freq: Once | ORAL | Status: DC | PRN

## 2016-03-13 MED ORDER — ATORVASTATIN CALCIUM 40 MG PO TABS
40.0000 mg | ORAL_TABLET | Freq: Every day | ORAL | Status: DC
  Administered 2016-03-15: 40 mg via ORAL
  Filled 2016-03-13 (×3): qty 1

## 2016-03-13 MED ORDER — ONDANSETRON HCL 4 MG/2ML IJ SOLN: 4.0000 mg | INTRAMUSCULAR | Status: DC | PRN

## 2016-03-13 MED ORDER — SUCCINYLCHOLINE CHLORIDE 20 MG/ML IJ SOLN
INTRAMUSCULAR | Status: DC | PRN
  Administered 2016-03-13: 120 mg via INTRAVENOUS

## 2016-03-13 MED ORDER — ONDANSETRON HCL 4 MG/2ML IJ SOLN
INTRAMUSCULAR | Status: AC
  Filled 2016-03-13: qty 2

## 2016-03-13 MED ORDER — LACTATED RINGERS IV SOLN
INTRAVENOUS | Status: DC | PRN
  Administered 2016-03-13: 20:00:00 via INTRAVENOUS

## 2016-03-13 MED ORDER — OXYCODONE HCL 5 MG/5ML PO SOLN: 5.0000 mg | Freq: Once | ORAL | Status: DC | PRN

## 2016-03-13 MED ORDER — MIDAZOLAM HCL 5 MG/5ML IJ SOLN
INTRAMUSCULAR | Status: DC | PRN
  Administered 2016-03-13: 2 mg via INTRAVENOUS

## 2016-03-13 MED ORDER — THROMBIN 5000 UNITS EX SOLR: CUTANEOUS | Status: DC | PRN

## 2016-03-13 MED ORDER — ONDANSETRON HCL 4 MG/2ML IJ SOLN: 4.0000 mg | Freq: Once | INTRAMUSCULAR | Status: DC | PRN

## 2016-03-13 MED ORDER — PHENYLEPHRINE HCL 10 MG/ML IJ SOLN
INTRAMUSCULAR | Status: DC | PRN
  Administered 2016-03-13: 80 ug via INTRAVENOUS
  Administered 2016-03-13: 40 ug via INTRAVENOUS

## 2016-03-13 MED ORDER — CEFAZOLIN SODIUM 1 G IJ SOLR
INTRAMUSCULAR | Status: AC
Start: 2016-03-13 — End: 2016-03-13
  Filled 2016-03-13: qty 60

## 2016-03-13 MED ORDER — POTASSIUM CHLORIDE IN NACL 20-0.9 MEQ/L-% IV SOLN
INTRAVENOUS | Status: DC
  Administered 2016-03-13 – 2016-03-14 (×2): via INTRAVENOUS
  Filled 2016-03-13 (×5): qty 1000

## 2016-03-13 MED ORDER — PROPOFOL 10 MG/ML IV BOLUS
INTRAVENOUS | Status: AC
  Filled 2016-03-13: qty 20

## 2016-03-13 MED ORDER — MIDAZOLAM HCL 2 MG/2ML IJ SOLN
INTRAMUSCULAR | Status: AC
  Filled 2016-03-13: qty 2

## 2016-03-13 MED ORDER — ACETAMINOPHEN 650 MG RE SUPP: 650.0000 mg | RECTAL | Status: DC | PRN

## 2016-03-13 MED ORDER — FENTANYL CITRATE (PF) 250 MCG/5ML IJ SOLN
INTRAMUSCULAR | Status: AC
  Filled 2016-03-13: qty 5

## 2016-03-13 MED ORDER — PHENYLEPHRINE 40 MCG/ML (10ML) SYRINGE FOR IV PUSH (FOR BLOOD PRESSURE SUPPORT)
PREFILLED_SYRINGE | INTRAVENOUS | Status: AC
  Filled 2016-03-13: qty 30

## 2016-03-13 MED ORDER — LIDOCAINE 2% (20 MG/ML) 5 ML SYRINGE
INTRAMUSCULAR | Status: AC
  Filled 2016-03-13: qty 5

## 2016-03-13 MED ORDER — 0.9 % SODIUM CHLORIDE (POUR BTL) OPTIME
TOPICAL | Status: DC | PRN
  Administered 2016-03-13 (×2): 1000 mL

## 2016-03-13 MED ORDER — CEFAZOLIN SODIUM 1 G IJ SOLR
INTRAMUSCULAR | Status: AC
  Filled 2016-03-13: qty 60

## 2016-03-13 MED ORDER — PROPOFOL 10 MG/ML IV BOLUS
INTRAVENOUS | Status: DC | PRN
  Administered 2016-03-13: 180 mg via INTRAVENOUS
  Administered 2016-03-13: 50 mg via INTRAVENOUS

## 2016-03-13 MED ORDER — FENTANYL CITRATE (PF) 100 MCG/2ML IJ SOLN
INTRAMUSCULAR | Status: DC | PRN
  Administered 2016-03-13 (×4): 50 ug via INTRAVENOUS

## 2016-03-13 MED ORDER — HEMOSTATIC AGENTS (NO CHARGE) OPTIME
TOPICAL | Status: DC | PRN
  Administered 2016-03-13: 1 via TOPICAL

## 2016-03-13 MED ORDER — PIPERACILLIN-TAZOBACTAM 3.375 G IVPB
3.3750 g | Freq: Three times a day (TID) | INTRAVENOUS | Status: DC
  Administered 2016-03-13 – 2016-03-16 (×8): 3.375 g via INTRAVENOUS
  Filled 2016-03-13 (×10): qty 50

## 2016-03-13 MED ORDER — CEFAZOLIN SODIUM-DEXTROSE 2-4 GM/100ML-% IV SOLN
2.0000 g | INTRAVENOUS | Status: AC
  Administered 2016-03-13: 2 g via INTRAVENOUS

## 2016-03-13 MED ORDER — LIDOCAINE HCL (CARDIAC) 20 MG/ML IV SOLN
INTRAVENOUS | Status: DC | PRN
  Administered 2016-03-13: 60 mg via INTRAVENOUS

## 2016-03-13 MED ORDER — FENTANYL CITRATE (PF) 100 MCG/2ML IJ SOLN: 25.0000 ug | INTRAMUSCULAR | Status: DC | PRN

## 2016-03-13 MED ORDER — ARTIFICIAL TEARS OP OINT
TOPICAL_OINTMENT | OPHTHALMIC | Status: AC
  Filled 2016-03-13: qty 7

## 2016-03-13 MED ORDER — PHENOL 1.4 % MT LIQD: 1.0000 | OROMUCOSAL | Status: DC | PRN

## 2016-03-13 MED ORDER — MENTHOL 3 MG MT LOZG: 1.0000 | LOZENGE | OROMUCOSAL | Status: DC | PRN

## 2016-03-13 MED ORDER — ACETAMINOPHEN 325 MG PO TABS
650.0000 mg | ORAL_TABLET | ORAL | Status: DC | PRN
  Administered 2016-03-13 – 2016-03-16 (×3): 650 mg via ORAL
  Filled 2016-03-13 (×4): qty 2

## 2016-03-13 MED ORDER — ONDANSETRON HCL 4 MG/2ML IJ SOLN
INTRAMUSCULAR | Status: DC | PRN
  Administered 2016-03-13: 4 mg via INTRAVENOUS

## 2016-03-13 MED ORDER — THROMBIN 5000 UNITS EX SOLR
CUTANEOUS | Status: DC | PRN
Start: 2016-03-13 — End: 2016-03-13
  Administered 2016-03-13 (×2): 5000 [IU] via TOPICAL

## 2016-03-13 MED ORDER — SODIUM CHLORIDE 0.9 % IJ SOLN
INTRAMUSCULAR | Status: AC
  Filled 2016-03-13: qty 20

## 2016-03-13 SURGICAL SUPPLY — 70 items
BAG DECANTER FOR FLEXI CONT (MISCELLANEOUS) ×3 IMPLANT
BANDAGE ADH SHEER 1  50/CT (GAUZE/BANDAGES/DRESSINGS) IMPLANT
BENZOIN TINCTURE PRP APPL 2/3 (GAUZE/BANDAGES/DRESSINGS) ×3 IMPLANT
BLADE SURG 10 STRL SS (BLADE) IMPLANT
BLADE SURG 11 STRL SS (BLADE) ×3 IMPLANT
BNDG GAUZE ELAST 4 BULKY (GAUZE/BANDAGES/DRESSINGS) IMPLANT
BRUSH SCRUB EZ 1% IODOPHOR (MISCELLANEOUS) ×3 IMPLANT
BRUSH SCRUB EZ PLAIN DRY (MISCELLANEOUS) ×3 IMPLANT
BUR ACORN 6.0 PRECISION (BURR) ×2 IMPLANT
BUR ACORN 6.0MM PRECISION (BURR) ×1
CANISTER SUCT 3000ML PPV (MISCELLANEOUS) ×3 IMPLANT
CLIP RANEY DISP (INSTRUMENTS) IMPLANT
CORDS BIPOLAR (ELECTRODE) IMPLANT
COVER MAYO STAND STRL (DRAPES) IMPLANT
DRAPE INCISE IOBAN 85X60 (DRAPES) IMPLANT
DRAPE ORTHO SPLIT 77X108 STRL (DRAPES)
DRAPE POUCH INSTRU U-SHP 10X18 (DRAPES) ×3 IMPLANT
DRAPE SURG 17X23 STRL (DRAPES) IMPLANT
DRAPE SURG ORHT 6 SPLT 77X108 (DRAPES) IMPLANT
ELECT CAUTERY BLADE 6.4 (BLADE) ×3 IMPLANT
ELECT REM PT RETURN 9FT ADLT (ELECTROSURGICAL) ×3
ELECTRODE REM PT RTRN 9FT ADLT (ELECTROSURGICAL) ×1 IMPLANT
GAUZE SPONGE 4X4 12PLY STRL (GAUZE/BANDAGES/DRESSINGS) ×3 IMPLANT
GLOVE ECLIPSE 9.0 STRL (GLOVE) ×3 IMPLANT
GLOVE EXAM NITRILE LRG STRL (GLOVE) IMPLANT
GLOVE EXAM NITRILE MD LF STRL (GLOVE) IMPLANT
GLOVE EXAM NITRILE XL STR (GLOVE) IMPLANT
GLOVE EXAM NITRILE XS STR PU (GLOVE) IMPLANT
GOWN STRL REUS W/ TWL LRG LVL3 (GOWN DISPOSABLE) IMPLANT
GOWN STRL REUS W/ TWL XL LVL3 (GOWN DISPOSABLE) IMPLANT
GOWN STRL REUS W/TWL 2XL LVL3 (GOWN DISPOSABLE) IMPLANT
GOWN STRL REUS W/TWL LRG LVL3 (GOWN DISPOSABLE)
GOWN STRL REUS W/TWL XL LVL3 (GOWN DISPOSABLE)
HEMOSTAT SURGICEL 2X14 (HEMOSTASIS) IMPLANT
KIT BASIN OR (CUSTOM PROCEDURE TRAY) ×3 IMPLANT
KIT ROOM TURNOVER OR (KITS) ×3 IMPLANT
NEEDLE BLUNT 16X1.5 OR ONLY (NEEDLE) IMPLANT
NS IRRIG 1000ML POUR BTL (IV SOLUTION) ×6 IMPLANT
PAD ARMBOARD 7.5X6 YLW CONV (MISCELLANEOUS) ×9 IMPLANT
PATTIES SURGICAL .5 X.5 (GAUZE/BANDAGES/DRESSINGS) IMPLANT
PATTIES SURGICAL .5 X3 (DISPOSABLE) IMPLANT
PENCIL BUTTON HOLSTER BLD 10FT (ELECTRODE) IMPLANT
RUBBERBAND STERILE (MISCELLANEOUS) IMPLANT
SHEATH PERITONEAL INTRO 46 (MISCELLANEOUS) IMPLANT
SHEATH PERITONEAL INTRO 61 (MISCELLANEOUS) IMPLANT
SPONGE INTESTINAL PEANUT (DISPOSABLE) ×3 IMPLANT
SPONGE LAP 4X18 X RAY DECT (DISPOSABLE) IMPLANT
SPONGE SURGIFOAM ABS GEL SZ50 (HEMOSTASIS) ×3 IMPLANT
STAPLER VISISTAT 35W (STAPLE) ×3 IMPLANT
STOCKINETTE 6  STRL (DRAPES) ×2
STOCKINETTE 6 STRL (DRAPES) ×1 IMPLANT
SUT BONE WAX W31G (SUTURE) ×3 IMPLANT
SUT CHROMIC 3 0 SH 27 (SUTURE) IMPLANT
SUT ETHILON 3 0 FSL (SUTURE) IMPLANT
SUT ETHILON 4 0 PS 2 18 (SUTURE) IMPLANT
SUT NURALON 4 0 TR CR/8 (SUTURE) IMPLANT
SUT SILK 0 TIES 10X30 (SUTURE) IMPLANT
SUT SILK 2 0 TIES 17X18 (SUTURE)
SUT SILK 2-0 18XBRD TIE BLK (SUTURE) IMPLANT
SUT SILK 3 0 SH 30 (SUTURE) IMPLANT
SUT VIC AB 2-0 CT2 18 VCP726D (SUTURE) ×6 IMPLANT
SUT VIC AB 3-0 SH 8-18 (SUTURE) ×6 IMPLANT
SUT VICRYL 4-0 PS2 18IN ABS (SUTURE) IMPLANT
SYR 5ML LL (SYRINGE) IMPLANT
TOWEL OR 17X24 6PK STRL BLUE (TOWEL DISPOSABLE) ×3 IMPLANT
TOWEL OR 17X26 10 PK STRL BLUE (TOWEL DISPOSABLE) ×3 IMPLANT
TRAY ENT MC OR (CUSTOM PROCEDURE TRAY) IMPLANT
TRAY FOLEY W/METER SILVER 14FR (SET/KITS/TRAYS/PACK) IMPLANT
UNDERPAD 30X30 INCONTINENT (UNDERPADS AND DIAPERS) IMPLANT
WATER STERILE IRR 1000ML POUR (IV SOLUTION) ×3 IMPLANT

## 2016-03-13 NOTE — Anesthesia Postprocedure Evaluation (Signed)
Anesthesia Post Note  Patient: Charles Marquez  Procedure(s) Performed: Procedure(s) (LRB): SHUNT REMOVAL (Right)  Patient location during evaluation: PACU Anesthesia Type: General Level of consciousness: awake, awake and alert and oriented Pain management: pain level controlled Vital Signs Assessment: post-procedure vital signs reviewed and stable Respiratory status: spontaneous breathing, nonlabored ventilation and respiratory function stable Cardiovascular status: blood pressure returned to baseline Anesthetic complications: no    Last Vitals:  Filed Vitals:   03/13/16 2115 03/13/16 2137  BP: 147/79 141/91  Pulse: 63 71  Temp: 36.7 C 36.8 C  Resp: 16 16    Last Pain:  Filed Vitals:   03/13/16 2156  PainSc: 5                  Ory Elting COKER

## 2016-03-13 NOTE — Op Note (Signed)
Date of procedure: 03/13/2016  Date of dictation: Same  Service: Neurosurgery  Preoperative diagnosis: Right occipital ventriculoperitoneal shunt malfunction  Postoperative diagnosis: Same  Procedure Name: Removal right occipital VP shunt  Surgeon:Jozy Mcphearson A.Landen Breeland, M.D.  Asst. Surgeon: None  Anesthesia: General  Indication: 60 year old male 15 months status post right occipital VP shunt placement. Patient presents now after episode of vomiting with shunt tube coming out of his mouth. Evaluation confirms erosion of a shunt tube through his stomach. Patient without evidence of acute abdomen, fever or white count. Patient without symptoms of headache or other neurologic symptoms. I discussed situation with the patient and family. I recommended VP shunt removal and observation. Planned treatment with IV antibiotics and evaluation for intra-abdominal problems.  Operative note: After induction of anesthesia, patient positioned supine with neck and head turned toward the left. Right occipital region prepped and draped sterilely. Incision made through her old shunt incision. Proximal shunt dissected free with electrocautery. Shunt was removed and one section without difficulty. No evidence of high-pressure CSF flow or bleeding. Wound irrigated. Closed with 2-0 Vicryl suture the galea and staples at the surface. No apparent complications. Patient returns to the covering postoperative

## 2016-03-13 NOTE — Anesthesia Preprocedure Evaluation (Addendum)
Anesthesia Evaluation  Patient identified by MRN, date of birth, ID band Patient awake    Reviewed: Allergy & Precautions, NPO status , Patient's Chart, lab work & pertinent test results  Airway Mallampati: II  TM Distance: <3 FB Neck ROM: Full    Dental  (+) Dental Advisory Given, Partial Upper,    Pulmonary pneumonia, resolved,    breath sounds clear to auscultation       Cardiovascular  Rhythm:Regular Rate:Normal     Neuro/Psych  Headaches, TIACVA    GI/Hepatic   Endo/Other    Renal/GU      Musculoskeletal  (+) Arthritis ,   Abdominal   Peds  Hematology   Anesthesia Other Findings   Reproductive/Obstetrics                          Anesthesia Physical Anesthesia Plan  ASA: III  Anesthesia Plan: General   Post-op Pain Management:    Induction: Intravenous  Airway Management Planned: Oral ETT  Additional Equipment:   Intra-op Plan:   Post-operative Plan: Extubation in OR  Informed Consent: I have reviewed the patients History and Physical, chart, labs and discussed the procedure including the risks, benefits and alternatives for the proposed anesthesia with the patient or authorized representative who has indicated his/her understanding and acceptance.   Dental advisory given  Plan Discussed with: CRNA and Anesthesiologist  Anesthesia Plan Comments:         Anesthesia Quick Evaluation

## 2016-03-13 NOTE — ED Notes (Addendum)
He vomited today from a headache, during the episode of vomiting his VP shunt dislodged and it now coming out of his mouth. He is holding it in place. He denies pain. He called neurosurgeon and they referred him here for management. He is alert and breathing easily

## 2016-03-13 NOTE — Anesthesia Procedure Notes (Signed)
Procedure Name: Intubation Date/Time: 03/13/2016 8:00 PM Performed by: Julianne RiceBILOTTA, Thurlow Gallaga Z Pre-anesthesia Checklist: Patient identified, Timeout performed, Emergency Drugs available, Suction available and Patient being monitored Patient Re-evaluated:Patient Re-evaluated prior to inductionOxygen Delivery Method: Circle system utilized Preoxygenation: Pre-oxygenation with 100% oxygen Intubation Type: IV induction, Rapid sequence and Cricoid Pressure applied Laryngoscope Size: Mac and 3 Grade View: Grade III Tube size: 7.5 mm Number of attempts: 1 Airway Equipment and Method: Stylet Placement Confirmation: ETT inserted through vocal cords under direct vision,  breath sounds checked- equal and bilateral and positive ETCO2 Secured at: 23 cm Tube secured with: Tape Dental Injury: Teeth and Oropharynx as per pre-operative assessment

## 2016-03-13 NOTE — Transfer of Care (Signed)
Immediate Anesthesia Transfer of Care Note  Patient: Charles Marquez  Procedure(s) Performed: Procedure(s): SHUNT REMOVAL (Right)  Patient Location: PACU  Anesthesia Type:General  Level of Consciousness: awake, alert  and oriented  Airway & Oxygen Therapy: Patient Spontanous Breathing and Patient connected to nasal cannula oxygen  Post-op Assessment: Report given to RN and Post -op Vital signs reviewed and stable  Post vital signs: Reviewed and stable  Last Vitals:  Filed Vitals:   03/13/16 1728  BP: 145/104  Pulse: 73  Resp: 17    Last Pain:  Filed Vitals:   03/13/16 1729  PainSc: 0-No pain         Complications: No apparent anesthesia complications

## 2016-03-13 NOTE — ED Provider Notes (Signed)
CSN: [id number]     Arrival date & time 03/13/16  1708 History   First MD Initiated Contact with Patient 03/13/16 1801     Chief Complaint  Patient presents with  . Follow-up    HPI   Damire Remedios is a 60 y.o. 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 He states he had sinus pressure this morning, which typically causes him to feel nauseous. He notes he had an episode of emesis and after that time, felt something in his mouth, and noticed his shunt was dislodged, prompting him to call neurosurgery and come to the ED. He currently denies complaints. He denies fever, chills, headache, lightheadedness, dizziness, chest pain, shortness of breath, abdominal pain, diarrhea, constipation, numbness, weakness, paresthesia.   Past Medical History  Diagnosis Date  . TIA (transient ischemic attack)     09.15  . Hypercholesteremia   . Stroke (HCC)     tia's  . Headache    Past Surgical History  Procedure Laterality Date  . Fractured arm Left 12  . Hernia repair Right 3/12  . Vasectomy  10/02/1997  . Ventriculoperitoneal shunt Right 12/18/2014    Procedure: Shunt Placment - right occipital VP shunt ;  Surgeon: Temple Pacini, MD;  Location: [facility];  Service: Neurosurgery;  Laterality: Right;  Shunt Placment - right occipital VP shunt    Family History  Problem Relation Age of Onset  . COPD Mother   . Lung cancer Father   . Alzheimer's disease Father    Social History  Substance Use Topics  . Smoking status: Never Smoker   . Smokeless tobacco: Never Used  . Alcohol Use: 0.0 oz/week    0 Standard drinks or equivalent per week     Comment: ocassionally      Review of Systems  Constitutional: Negative for fever and chills.  Eyes: Negative for visual disturbance.  Respiratory: Negative for shortness of breath.   Cardiovascular: Negative for chest pain.  Gastrointestinal: Positive for nausea and vomiting.  Negative for diarrhea and constipation.  Neurological: Positive for headaches. Negative for dizziness, syncope, weakness, light-headedness and numbness.  All other systems reviewed and are negative.     Allergies  Review of patient's allergies indicates no known allergies.  Home Medications   Prior to Admission medications   Medication Sig Start Date End Date Taking? Authorizing Provider  atorvastatin (LIPITOR) 40 MG tablet Take 1 tablet (40 mg total) by mouth daily. 11/19/14   Roderick Pee, MD  clopidogrel (PLAVIX) 75 MG tablet Take 1 tablet (75 mg total) by mouth daily. 01/14/15   Roderick Pee, MD  HYDROcodone-acetaminophen (NORCO/VICODIN) 5-325 MG per tablet Take 1-2 tablets by mouth every 4 (four) hours as needed for moderate pain. 12/18/14   Julio Sicks, MD    BP 145/104 mmHg  Pulse 73  Temp(Src)   Resp 17  SpO2 96% Physical Exam  Constitutional: He is oriented to person, place, and time. He appears well-developed and well-nourished. No distress.  HENT:  Head: Normocephalic and atraumatic.  Right Ear: External ear normal.  Left Ear: External ear normal.  Nose: Nose normal.  Mouth/Throat: Uvula is midline, oropharynx is clear and moist and mucous membranes are normal.  Eyes: Conjunctivae, EOM and lids are normal. Pupils are equal, round, and reactive to light. Right eye exhibits no discharge. Left eye exhibits no discharge. No scleral icterus.  Neck: Normal  range of motion. Neck supple.  Cardiovascular: Normal rate, regular rhythm, normal heart sounds, intact distal pulses and normal pulses.   Pulmonary/Chest: Effort normal and breath sounds normal. No respiratory distress. He has no wheezes. He has no rales.  Abdominal: Soft. Normal appearance and bowel sounds are normal. He exhibits no distension and no mass. There is no tenderness. There is no rigidity, no rebound and no guarding.  Musculoskeletal: Normal range of motion. He exhibits no edema or tenderness.  Neurological: He  is alert and oriented to person, place, and time. He has normal strength. No cranial nerve deficit or sensory deficit.  Skin: Skin is warm, dry and intact. No rash noted. He is not diaphoretic. No erythema. No pallor.  Psychiatric: He has a normal mood and affect. His speech is normal and behavior is normal.  Nursing note and vitals reviewed.   ED Course  Procedures (including critical care time)  Labs Review Labs Reviewed  CBC WITH DIFFERENTIAL/PLATELET - Abnormal; Notable for the following:    RBC 4.21 (*)    Hemoglobin 12.7 (*)    HCT 38.8 (*)    All other components within normal limits  BASIC METABOLIC PANEL - Abnormal; Notable for the following:    Glucose, Bld 104 (*)    All other components within normal limits    Imaging Review Dg Chest 2 View  03/13/2016  CLINICAL DATA:  Ventricular peritoneal shunt placed 14 weeks ago. Today the patient states he vomited and found the end of his shunt protruding from his mouth. EXAM: CHEST  2 VIEW COMPARISON:  10/20/2014 FINDINGS: Two radiographs of the chest and 2 radiographs of the abdomen demonstrate the ventriculoperitoneal shunt tubing coursing through the subcutaneous tissues of the anterior right hemi thorax, then extending deep into the epigastrium. The course of the catheter is consistent with extension into the gastric lumen. The lungs are clear. Hilar, mediastinal and cardiac contours are normal. There is no pleural effusion. Pulmonary vasculature is normal. No interval change from the prior chest radiograph of 10/20/2014. Two views of the abdomen demonstrate an unusual gas collection in the right lateral abdomen which probably represents extraluminal air outlining the edge of the liver. Abdominal gas pattern is otherwise unremarkable. IMPRESSION: Extraluminal gas in the abdomen. The course of the ventricular peritoneal shunt is consistent with extension into the gastric lumen. Critical Value/emergent results were called by telephone at  the time of interpretation on 03/13/2016 at 6:44 pm to [facility] , who verbally acknowledged these results. Electronically Signed   By: Ellery Plunkaniel R Mitchell M.D.   On: 03/13/2016 18:53   Dg Abd 2 Views  03/13/2016  CLINICAL DATA:  Ventricular peritoneal shunt placed 14 weeks ago. Today the patient states he vomited and found the end of his shunt protruding from his mouth. EXAM: CHEST  2 VIEW COMPARISON:  10/20/2014 FINDINGS: Two radiographs of the chest and 2 radiographs of the abdomen demonstrate the ventriculoperitoneal shunt tubing coursing through the subcutaneous tissues of the anterior right hemi thorax, then extending deep into the epigastrium. The course of the catheter is consistent with extension into the gastric lumen. The lungs are clear. Hilar, mediastinal and cardiac contours are normal. There is no pleural effusion. Pulmonary vasculature is normal. No interval change from the prior chest radiograph of 10/20/2014. Two views of the abdomen demonstrate an unusual gas collection in the right lateral abdomen which probably represents extraluminal air outlining the edge of the liver. Abdominal gas pattern is otherwise unremarkable. IMPRESSION: Extraluminal  gas in the abdomen. The course of the ventricular peritoneal shunt is consistent with extension into the gastric lumen. Critical Value/emergent results were called by telephone at the time of interpretation on 03/13/2016 at 6:44 pm to PA [facility] , who verbally acknowledged these results. Electronically Signed   By: Ellery Plunk M.D.   On: 03/13/2016 18:53   I have personally reviewed and evaluated these images and lab results as part of my medical decision-making.   EKG Interpretation None      MDM   Final diagnoses:  Shunt malfunction, initial encounter    61 year old male presents with shunt malfunction. Notes his shunt became dislodged after an episode of emesis today prior to arrival. Currently denies complaints.  Vital  signs stable. Heart RRR. Lungs clear to auscultation bilaterally. Abdomen soft, non-tender, non-distended. No rebound, guarding, or masses. Normal neuro exam with no focal deficit.  Patient already evaluated by Dr. Dutch Quint, who plans to remove the shunt this evening. Called by radiologist, Dr. Clovis Riley, who advised it appears the shunt enters the patient's epigastrium at the gastric antrum.  CBC negative for leukocytosis, hemoglobin 12.7. BMP unremarkable. Plain film of chest and abdomen remarkable for extraluminal gas, course of VP shunt consistent with extension into the gastric lumen. Patient taken to the OR by neurosurgery.  BP 145/104 mmHg  Pulse 73  Temp(Src)   Resp 17  SpO2 96%     Mady Gemma, PA-C 03/13/16 1947  Loren Racer, MD 03/14/16 [phone]

## 2016-03-13 NOTE — H&P (Signed)
Charles Marquez is an 60 y.o. male.   Chief Complaint: VP shunt malfunction HPI: 60 year old male status post previous a right-sided ventriculoperitoneal shunt. Patient shunt placed for communicating hydrocephalus. Patient has done very well postoperatively with no headaches or other neurologic symptoms. The patient had a brief episode of headache and abdominal pain earlier today. He vomited and produced the distal shunt tube through his mouth. He denies abdominal pain. He is having no fever. He has no headache. He has no symptoms of meningismus. Prior to this episode today he had been feeling well.  Past Medical History  Diagnosis Date  . TIA (transient ischemic attack)     09.15  . Hypercholesteremia   . Stroke (HCC)     tia's  . Headache     Past Surgical History  Procedure Laterality Date  . Fractured arm Left 12  . Hernia repair Right 3/12  . Vasectomy  10/02/1997  . Ventriculoperitoneal shunt Right 12/18/2014    Procedure: Shunt Placment - right occipital VP shunt ;  Surgeon: Temple PaciniHenry A Ulyssa Walthour, MD;  Location: [facility];  Service: Neurosurgery;  Laterality: Right;  Shunt Placment - right occipital VP shunt     Family History  Problem Relation Age of Onset  . COPD Mother   . Lung cancer Father   . Alzheimer's disease Father    Social History:  reports that he has never smoked. He has never used smokeless tobacco. He reports that he drinks alcohol. He reports that he does not use illicit drugs.  Allergies: No Known Allergies   (Not in a hospital admission)  No results found for this or any previous visit (from the past 48 hour(s)). No results found.  Pertinent items noted in HPI and remainder of comprehensive ROS otherwise negative.  Blood pressure 145/104, pulse 73, resp. rate 17, SpO2 96 %.  The patient is awake and alert. He is oriented and appropriate. His speech is fluent. His cranial nerve function is intact bilaterally. Motor 5/5 bilateral upper and lower extremities.  Sensory exam intact. Shunt track easily palpable in his right occipital region neck chest and upper abdomen. Shunt incision well-healed. No evidence of erythema along shunt track area examination head ears eyes.there is unremarkable. Chest otherwise normal. Abdomen soft with normal active bowel sounds. No rebound. Patient is afebrile and appears nontoxic. Assessment/Plan  VP shunt malfunction secondary to likely erosion into proximal small bowel or stomach. Patient without evidence of peritonitis or abdominal distress. I discussed situation with the patient. I recommended removal of his VP shunt system and prophylactic treatment with antibiotics. 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 If patient's symptoms remain minimal we can consider replacing the shunt may be as early as next week. This will be done with laparoscopic assist. I discussed the situation with the patient as well as Dr. Andrey CampanileWilson of general surgery. We will certainly evaluate his abdomen further should he have any symptoms postoperatively.  Charmayne Odell A 03/13/2016, 6:19 PM

## 2016-03-13 NOTE — Brief Op Note (Signed)
03/13/2016  8:26 PM  PATIENT:  Farris HasKurt Dauenhauer  60 y.o. male  PRE-OPERATIVE DIAGNOSIS:  vp shunt malfunction  POST-OPERATIVE DIAGNOSIS:  * No post-op diagnosis entered *  PROCEDURE:  Procedure(s): SHUNT REMOVAL (Right)  SURGEON:  Surgeon(s) and Role:    * Julio SicksHenry Rashid Whitenight, MD - Primary  PHYSICIAN ASSISTANT:   ASSISTANTS:    ANESTHESIA:   general  EBL:     BLOOD ADMINISTERED:none  DRAINS: none   LOCAL MEDICATIONS USED:  NONE  SPECIMEN:  No Specimen  DISPOSITION OF SPECIMEN:  N/A  COUNTS:  YES  TOURNIQUET:  * No tourniquets in log *  DICTATION: .Dragon Dictation  PLAN OF CARE: Admit to inpatient   PATIENT DISPOSITION:  PACU - hemodynamically stable.   Delay start of Pharmacological VTE agent (>24hrs) due to surgical blood loss or risk of bleeding: yes

## 2016-03-14 ENCOUNTER — Encounter (HOSPITAL_COMMUNITY): Payer: Self-pay | Admitting: Radiology

## 2016-03-14 ENCOUNTER — Inpatient Hospital Stay (HOSPITAL_COMMUNITY): Payer: 59

## 2016-03-14 LAB — CBC WITH DIFFERENTIAL/PLATELET
BASOS ABS: 0 10*3/uL (ref 0.0–0.1)
BASOS PCT: 0 %
Eosinophils Absolute: 0 10*3/uL (ref 0.0–0.7)
Eosinophils Relative: 1 %
HEMATOCRIT: 38.3 % — AB (ref 39.0–52.0)
HEMOGLOBIN: 12.4 g/dL — AB (ref 13.0–17.0)
Lymphocytes Relative: 15 %
Lymphs Abs: 1.2 10*3/uL (ref 0.7–4.0)
MCH: 30 pg (ref 26.0–34.0)
MCHC: 32.4 g/dL (ref 30.0–36.0)
MCV: 92.5 fL (ref 78.0–100.0)
MONO ABS: 0.9 10*3/uL (ref 0.1–1.0)
Monocytes Relative: 11 %
NEUTROS ABS: 5.9 10*3/uL (ref 1.7–7.7)
NEUTROS PCT: 73 %
Platelets: 178 10*3/uL (ref 150–400)
RBC: 4.14 MIL/uL — ABNORMAL LOW (ref 4.22–5.81)
RDW: 12.7 % (ref 11.5–15.5)
WBC: 8 10*3/uL (ref 4.0–10.5)

## 2016-03-14 LAB — BASIC METABOLIC PANEL
Anion gap: 8 (ref 5–15)
BUN: 14 mg/dL (ref 6–20)
CO2: 23 mmol/L (ref 22–32)
Calcium: 8.3 mg/dL — ABNORMAL LOW (ref 8.9–10.3)
Chloride: 106 mmol/L (ref 101–111)
Creatinine, Ser: 1 mg/dL (ref 0.61–1.24)
GFR calc Af Amer: >60 mL/min (ref >60)
GFR calc non Af Amer: >60 mL/min (ref >60)
Glucose, Bld: 95 mg/dL (ref 65–99)
Potassium: 3.5 mmol/L (ref 3.5–5.1)
Sodium: 137 mmol/L (ref 135–145)

## 2016-03-14 MED ORDER — DIATRIZOATE MEGLUMINE & SODIUM 66-10 % PO SOLN
15.0000 mL | ORAL | Status: AC
  Administered 2016-03-14 (×2): 15 mL via ORAL
  Filled 2016-03-14: qty 30

## 2016-03-14 MED ORDER — IOPAMIDOL (ISOVUE-300) INJECTION 61%
INTRAVENOUS | Status: AC
  Administered 2016-03-14: 100 mL
  Filled 2016-03-14: qty 100

## 2016-03-14 MED ORDER — DIATRIZOATE MEGLUMINE & SODIUM 66-10 % PO SOLN
ORAL | Status: AC
  Administered 2016-03-14: 30 mL
  Filled 2016-03-14: qty 30

## 2016-03-14 NOTE — Progress Notes (Signed)
Postop day 1. Patient denies headache. Patient denies abdominal pain. Does report some flatus.  Afebrile. Vital signs are stable. White blood cell count 8. Electrolytes normal. Patient is awake and alert. He is oriented and appropriate. His crayon nerve function is intact. Motor and sensory function extremities normal. Abdomen soft. Normal active bowel sounds. No rebound.  Patient looks very good. We'll get CT scan of head and abdomen. Decide whether to begin diet based on findings on CT of abdomen.

## 2016-03-14 NOTE — Care Management Note (Signed)
Case Management Note  Patient Details  Name: Farris HasKurt Fuelling MRN: [id number] Date of Birth: 04-16-1956  Subjective/Objective:                    Action/Plan: Patient was admitted for a SHUNT REMOVAL.  Lives at home with spouse. Will follow for discharge needs.  Expected Discharge Date:                  Expected Discharge Plan:     In-House Referral:     Discharge planning Services     Post Acute Care Choice:    Choice offered to:     DME Arranged:    DME Agency:     HH Arranged:    HH Agency:     Status of Service:  In process, will continue to follow  Medicare Important Message Given:    Date Medicare IM Given:    Medicare IM give by:    Date Additional Medicare IM Given:    Additional Medicare Important Message give by:     If discussed at Long Length of Stay Meetings, dates discussed:    Additional Comments:  Anda KraftRobarge, Esteven Overfelt C, RN 03/14/2016, 10:53 AM [phone]

## 2016-03-14 NOTE — Progress Notes (Signed)
Pt arrived to 5C15 from PACU. Pt alert and oriented x 4. Neuro intact. Pt has stocking cap dressing on head stained with minimal amount of serosangenious drainage. Pt oriented to room, call bell within reach, pain medications offered, family at bedside.  Will continue to monitor patient.

## 2016-03-15 LAB — CBC WITH DIFFERENTIAL/PLATELET
BASOS ABS: 0 10*3/uL (ref 0.0–0.1)
Basophils Relative: 0 %
EOS ABS: 0.1 10*3/uL (ref 0.0–0.7)
EOS PCT: 1 %
HCT: 38.4 % — ABNORMAL LOW (ref 39.0–52.0)
Hemoglobin: 12.9 g/dL — ABNORMAL LOW (ref 13.0–17.0)
LYMPHS ABS: 1.3 10*3/uL (ref 0.7–4.0)
LYMPHS PCT: 18 %
MCH: 31.7 pg (ref 26.0–34.0)
MCHC: 33.6 g/dL (ref 30.0–36.0)
MCV: 94.3 fL (ref 78.0–100.0)
MONO ABS: 0.7 10*3/uL (ref 0.1–1.0)
Monocytes Relative: 11 %
Neutro Abs: 4.9 10*3/uL (ref 1.7–7.7)
Neutrophils Relative %: 70 %
PLATELETS: 165 10*3/uL (ref 150–400)
RBC: 4.07 MIL/uL — ABNORMAL LOW (ref 4.22–5.81)
RDW: 12.5 % (ref 11.5–15.5)
WBC: 7 10*3/uL (ref 4.0–10.5)

## 2016-03-15 NOTE — Progress Notes (Signed)
Postop day 2. No headache. No abdominal pain. Positive flatus. Positive BM.  Awake and alert. Oriented and appropriate. He is afebrile. His vitals are stable. His abdomen is soft. He has normal active bowel sounds. His wound is clean and dry. Motor and sensory function of the extremities normal.  White blood cell count normal.  Head CT scan demonstrated some hemorrhage along the shunt track but no significant mass effect. No evidence of worsening hydrocephalus. We will get a follow-up head CT scan tomorrow. Abdominal CT scan demonstrates no evidence of contrast extravasation or abscess. Patient's diet began last night. We'll advance diet today.  Overall doing well. Continue to monitor with the shunt out. Advance diet to regular. Mobilized today. Continue antibiotics for 1 more day. The patient tolerates regular food and his head CT scan is stable tomorrow than plan discharge home with replacement of VP shunt at a later time.

## 2016-03-15 NOTE — Progress Notes (Signed)
Patient refused schedule toradol, stated that he had not had any pain.

## 2016-03-16 ENCOUNTER — Encounter (HOSPITAL_COMMUNITY): Payer: Self-pay | Admitting: Radiology

## 2016-03-16 ENCOUNTER — Inpatient Hospital Stay (HOSPITAL_COMMUNITY): Payer: 59

## 2016-03-16 NOTE — Progress Notes (Signed)
Discharge orders received, Pt for discharge home today. IV d/c'd. D/c instructions given with verbalized understanding. Family at bedside to assist patient with discharge. Staff bought pt downstairs via wheelchair. 03/16/16 1221

## 2016-03-16 NOTE — Care Management Note (Signed)
Case Management Note  Patient Details  Name: Farris HasKurt Tibbett MRN: [id number] Date of Birth: Apr 12, 1956  Subjective/Objective:                    Action/Plan: Pt discharging to home with self care. No further needs per CM.  Expected Discharge Date:                  Expected Discharge Plan:  Home/Self Care  In-House Referral:     Discharge planning Services     Post Acute Care Choice:    Choice offered to:     DME Arranged:    DME Agency:     HH Arranged:    HH Agency:     Status of Service:  Completed, signed off  Medicare Important Message Given:    Date Medicare IM Given:    Medicare IM give by:    Date Additional Medicare IM Given:    Additional Medicare Important Message give by:     If discussed at Long Length of Stay Meetings, dates discussed:    Additional Comments:  Kermit BaloKelli F Mistina Coatney, RN 03/16/2016, 10:52 AM

## 2016-03-16 NOTE — Discharge Summary (Signed)
Physician Discharge Summary  Patient ID: Charles Marquez MRN: [id number] DOB/AGE: [birth year] 60 y.o.  Admit date: 03/13/2016 Discharge date: 03/16/2016  Admission Diagnoses:  Discharge Diagnoses:  Active Problems:   Shunt malfunction   Discharged Condition: good  Hospital Course: Patient was admitted for evaluation of a VP shunt malfunction. Patient suffered a spontaneous perforation of his stomach or proximal duodenum with subsequent migration of his distal VP shunt catheter into his stomach which subsequently became apparent after an episode of emesis. The patient had no fever. No abdominal pain. He had benign exam otherwise. I discussed situation with the patient as well as general surgery. We opted to remove his VP shunt system and observe. Follow-up head CT scan demonstrated some hemorrhage along the path of his VP shunt proximal catheter where it had been removed. There was no mass effect. His hydrocephalus has remained stable to somewhat improved postoperatively. He had a CT scan of his abdomen which demonstrated no evidence of extravasation of contrast or intra-abdominal abscess. The patient has had normal bowel function postoperatively and has had his diet advanced without issue. He has remained afebrile. His wound is healing well. He has no neurologic symptoms from his intraparenchymal hemorrhage. Plan is for discharge home and follow-up with one week. The patient's hydrocephalus was communicating in nature and rather mild so the hope is that he can manage without a shunt indefinitely.  Consults:   Significant Diagnostic Studies:   Treatments:   Discharge Exam: Blood pressure 132/90, pulse 74, temperature 98.9 F (37.2 C), temperature source Oral, resp. rate 20, height 5\' 11"  (1.803 m), weight 92.352 kg (203 lb 9.6 oz), SpO2 98 %. The patient is awake and alert. He is oriented and appropriate. His cranial nerve function is intact. Motor and sensory function extremities are normal. His  wound is clean and dry. His chest is clear. His abdomen is soft. Bowel sounds are normal active.  Disposition: 01-Home or Self Care     Medication List    TAKE these medications        atorvastatin 40 MG tablet  Commonly known as:  LIPITOR  Take 1 tablet (40 mg total) by mouth daily.     clopidogrel 75 MG tablet  Commonly known as:  PLAVIX  Take 1 tablet (75 mg total) by mouth daily.     ondansetron 4 MG disintegrating tablet  Commonly known as:  ZOFRAN-ODT  Take 4 mg by mouth every 8 (eight) hours as needed for nausea or vomiting.           Follow-up Information    Follow up with Lilana Blasko A, MD In 1 week.   Specialty:  Neurosurgery   Contact information:   [address] [place] [address] [phone]       Signed: Temple PaciniOOL,Khaled Herda A 03/16/2016, 10:32 AM

## 2016-03-23 ENCOUNTER — Other Ambulatory Visit: Payer: Self-pay | Admitting: Neurosurgery

## 2016-03-23 DIAGNOSIS — G910 Communicating hydrocephalus: Secondary | ICD-10-CM

## 2016-03-29 ENCOUNTER — Ambulatory Visit
Admission: RE | Admit: 2016-03-29 | Discharge: 2016-03-29 | Disposition: A | Discharge status: Home / Self Care | Payer: 59 | Source: Ambulatory Visit | Attending: Neurosurgery | Admitting: Neurosurgery

## 2016-03-29 DIAGNOSIS — G910 Communicating hydrocephalus: Secondary | ICD-10-CM

## 2016-04-19 ENCOUNTER — Other Ambulatory Visit: Payer: Self-pay | Admitting: Neurosurgery

## 2016-04-19 DIAGNOSIS — G910 Communicating hydrocephalus: Secondary | ICD-10-CM

## 2016-04-24 ENCOUNTER — Ambulatory Visit
Admission: RE | Admit: 2016-04-24 | Discharge: 2016-04-24 | Disposition: A | Discharge status: Home / Self Care | Payer: 59 | Source: Ambulatory Visit | Attending: Neurosurgery | Admitting: Neurosurgery

## 2016-04-24 DIAGNOSIS — G910 Communicating hydrocephalus: Secondary | ICD-10-CM

## 2016-06-08 ENCOUNTER — Other Ambulatory Visit: Payer: Self-pay | Admitting: Neurosurgery

## 2016-06-08 DIAGNOSIS — G919 Hydrocephalus, unspecified: Secondary | ICD-10-CM

## 2016-06-09 ENCOUNTER — Ambulatory Visit
Admission: RE | Admit: 2016-06-09 | Discharge: 2016-06-09 | Disposition: A | Discharge status: Home / Self Care | Payer: 59 | Source: Ambulatory Visit | Attending: Neurosurgery | Admitting: Neurosurgery

## 2016-06-09 DIAGNOSIS — G919 Hydrocephalus, unspecified: Secondary | ICD-10-CM

## 2016-07-03 ENCOUNTER — Encounter: Payer: Self-pay | Admitting: Neurology

## 2016-08-09 ENCOUNTER — Ambulatory Visit: Payer: 59 | Admitting: Neurology

## 2016-08-22 ENCOUNTER — Ambulatory Visit: Payer: 59 | Admitting: Neurology

## 2016-09-01 ENCOUNTER — Other Ambulatory Visit: Payer: Self-pay | Admitting: Neurosurgery

## 2016-09-01 DIAGNOSIS — G910 Communicating hydrocephalus: Secondary | ICD-10-CM

## 2016-09-18 ENCOUNTER — Ambulatory Visit (INDEPENDENT_AMBULATORY_CARE_PROVIDER_SITE_OTHER): Payer: 59 | Admitting: Neurology

## 2016-09-18 ENCOUNTER — Encounter: Payer: Self-pay | Admitting: Neurology

## 2016-09-18 VITALS — BP 117/75 | HR 80 | Ht 71.0 in | Wt 192.8 lb

## 2016-09-18 DIAGNOSIS — G459 Transient cerebral ischemic attack, unspecified: Secondary | ICD-10-CM | POA: Diagnosis not present

## 2016-09-18 DIAGNOSIS — R413 Other amnesia: Secondary | ICD-10-CM

## 2016-09-18 NOTE — Progress Notes (Signed)
[facility] [address] [place]. [place] [id number] [phone]       OFFICE FOLLOW-UP NOTE  [name] Date of Birth:  August 14, 1956 Medical Record Number:  [id number]   HPI:    89 year Caucasian male seen today for first office follow-up visit following hospital admission for TIA on 10/19/14. He presented with sudden onset of tingling involving the right side of the face as well as right fingers. He also saw some floaters earlier on the day in his vision. He denied any complaint headache. He had some difficulty speaking with some garbled words. His symptoms are transient and improved by the time he came to the hospital. CT scan of the head as well as an MRI scan showed no acute infarct but showed completely getting hydrocephalus which was apparently a new finding compared to previous scan in 2009. The patient however denied any significant head injury with loss of consciousness, intracranial bleed, meningitis and any clinical symptoms suggestive of hydrocephalus in the form of significant headaches, gait or balance difficulties or urinary incontinence or memory loss. He was seen by neurosurgeon Dr. Lelon PerlaHenry Marquez and subsequently had outpatient for elective ventriculoperitoneal shunt placed on 12/18/14. The procedure went well. Patient states he was having some minor headaches prior to the shunt surgery which seem to have now gone. He has had no recurrent stroke or TIA symptoms. He was previously on aspirin which we switched to Plavix which is tolerating well without significant bleeding or bruising. He is also tolerating his statin without any side effects. He had a remote history of TIA in 2009 when he had left body paresthesias and he had seen me at that time and had been started on aspirin. Transthoracic echo, carotid Dopplers, lipid profile  Showed LDL 83 mg% and Hb A1c was 2.1%5.5%. He had some minor abdominal pain in the last week or so but this has been changing sites and he has been  eating well and has had bowel movements. I advised him to watch this pain from now if it gets severe he needs to contact his primary physician Update 08/10/2015 : He returns for follow-up the last visit 6 months ago. He continues to do well without recurrent neurovascular symptoms. He remains on Plavix which is tolerating well without bleeding, bruising or other side effects. He is also on Lipitor and is not having any significant myalgias or arthralgias. He recently changed his primary physician from Dr. Tawanna Coolerodd to Dr. Cyndia Marquez who did some lab work 2-3 months ago which was apparently unremarkable. Patient has had an upcoming appointment with Dr. Jordan Marquez on October 11. He is having some issues with bowel movements and constipation and advise him to discuss this with his primary physician. He has no neurological complaints today. Update 09/18/2016 ; she he returns for follow-up after last visit a year ago. Continues to do well from stroke standpoint without recurrent TIA or stroke symptoms. He however had interesting bizarre episode when in May 2017 he vomited and his BP shunt catheter tube came surprisingly out of his mouth. Apparently he had had a silent gastric erosion and the tube had entered the stomach. He had to be hospitalized and chest x-ray and CT scan of the abdomen showed slight gas in the abdomen. The VP shunt tube was removed. He did have some right parietal intracerebral hemorrhage which was followed conservatively. He had some cognitive issues and left hand fine motor skills but the symptoms improving. He had some trouble walking but  now has recovered and is back to work full-time without any problems. He remains on Plavix which is tolerating well without bleeding or bruising as well as Lipitor without muscle aches and pains. He did have follow-up lipid profile checked by his primary physician which was apparently fine. ROS:   14 system review of systems is positive for mild memory and cognitive  difficulties and all other systems negative  PMH:  Past Medical History:  Diagnosis Date  . Hypercholesteremia   . Stroke (HCC)    tia's  . TIA (transient ischemic attack)    09.15    Social History:  Social History   Social History  . Marital status: Married    Spouse name: N/A  . Number of children: 2  . Years of education: MASTERS   Occupational History  . Not on file.   Social History Main Topics  . Smoking status: Never Smoker  . Smokeless tobacco: Never Used  . Alcohol use 0.0 oz/week     Comment: ocassionally  . Drug use: No  . Sexual activity: Not on file   Other Topics Concern  . Not on file   Social History Narrative   Patient is married with 2 children.   Patient is right handed.   Patient has a Master's degree.   Patient drinks 24 oz of soda daily.    Medications:   Current Outpatient Prescriptions on File Prior to Visit  Medication Sig Dispense Refill  . atorvastatin (LIPITOR) 40 MG tablet Take 1 tablet (40 mg total) by mouth daily. 90 tablet 3  . clopidogrel (PLAVIX) 75 MG tablet Take 1 tablet (75 mg total) by mouth daily. 90 tablet 0  . ondansetron (ZOFRAN-ODT) 4 MG disintegrating tablet Take 4 mg by mouth every 8 (eight) hours as needed for nausea or vomiting.     No current facility-administered medications on file prior to visit.     Allergies:  No Known Allergies  Physical Exam General: well developed, well nourished Middle-aged Caucasian male, seated, in no evident distress Head: head normocephalic and atraumatic.  Neck: supple with no carotid or supraclavicular bruits Cardiovascular: regular rate and rhythm, no murmurs Musculoskeletal: no deformity Skin:  no rash/petichiae. Rt occipital subcutaneous VP shunt noted. Vascular:  Normal pulses all extremities Vitals:   09/18/16 1357  BP: 117/75  Pulse: 80   Neurologic Exam Mental Status: Awake and fully alert. Oriented to place and time. Recent and remote memory intact. Attention  span, concentration and fund of knowledge appropriate. Mood and affect appropriate. Recall 3/3. Animal naming test 14. Cranial Nerves: Fundoscopic exam not done  . Pupils equal, briskly reactive to light. Extraocular movements full without nystagmus. Visual fields full to confrontation. Hearing intact. Facial sensation intact. Face, tongue, palate moves normally and symmetrically.  Motor: Normal bulk and tone. Normal strength in all tested extremity muscles. Sensory.: intact to touch ,pinprick .position and vibratory sensation.  Coordination: Rapid alternating movements normal in all extremities. Finger-to-nose and heel-to-shin performed accurately bilaterally. Gait and Station: Arises from chair without difficulty. Stance is normal. Gait demonstrates normal stride length and balance . Able to heel, toe and tandem walk without difficulty.  Reflexes: 1+ and symmetric. Toes downgoing.       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 Prior history of right hemispheric TIA in 2009 also likely due to small vessel disease. 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 Interestingly no recurrent normal pressure hydrocephalus on follow-up imaging so far. Mild cognitive impairment following small right parietal intracerebral hemorrhage in May 2017 following VP shunt removal which appears to be improving    PLAN: I had a long d/w patient about his remote TIAs, mild memory difficulties, risk for recurrent stroke/TIAs, personally independently reviewed imaging studies and stroke evaluation results and answered questions.Continue Plavix for secondary stroke prevention and maintain strict control of hypertension with blood  pressure goal below 130/90, diabetes with hemoglobin A1c goal below 6.5% and lipids with LDL cholesterol goal below 70 mg/dL. I also advised the patient to eat a healthy diet with plenty of whole grains, cereals, fruits and vegetables, exercise regularly and maintain ideal body weight . Check follow-up carotid ultrasound study as he is not had one in 2 years I advised the patient to do mentally challenging activities like solving crossword puzzles, sudoku and playing bridge to help with his mild cognitive difficulties No routine follow-up appointment is necessary.   Delia HeadyPramod Jayan Raymundo, MD  Note: This document was prepared with digital dictation and possible smart phrase technology. Any transcriptional errors that result from this process are unintentional

## 2016-09-18 NOTE — Patient Instructions (Addendum)
I had a long d/w patient about his remote TIAs, mild memory difficulties, risk for recurrent stroke/TIAs, personally independently reviewed imaging studies and stroke evaluation results and answered questions.Continue Plavix for secondary stroke prevention and maintain strict control of hypertension with blood pressure goal below 130/90, diabetes with hemoglobin A1c goal below 6.5% and lipids with LDL cholesterol goal below 70 mg/dL. I also advised the patient to eat a healthy diet with plenty of whole grains, cereals, fruits and vegetables, exercise regularly and maintain ideal body weight . Check follow-up carotid ultrasound study as he is not had one in 2 years I advised the patient to do mentally challenging activities like solving crossword puzzles, sudoku and playing bridge to help with his mild cognitive difficulties No routine follow-up appointment is necessary.

## 2016-09-26 ENCOUNTER — Telehealth: Payer: Self-pay | Admitting: Neurology

## 2016-09-26 NOTE — Telephone Encounter (Signed)
Here is one Charles Marquez . No PA needed.

## 2016-10-13 ENCOUNTER — Ambulatory Visit
Admission: RE | Admit: 2016-10-13 | Discharge: 2016-10-13 | Disposition: A | Discharge status: Home / Self Care | Payer: 59 | Source: Ambulatory Visit | Attending: Neurosurgery | Admitting: Neurosurgery

## 2016-10-13 DIAGNOSIS — G910 Communicating hydrocephalus: Secondary | ICD-10-CM

## 2016-10-19 ENCOUNTER — Telehealth: Payer: Self-pay | Admitting: Neurology

## 2016-10-19 NOTE — Telephone Encounter (Signed)
Called patient and left him a message to call me to schedule his carotid doppler. I will speak to patient when he calls. We have been playing phone Tag.

## 2016-10-25 ENCOUNTER — Ambulatory Visit (INDEPENDENT_AMBULATORY_CARE_PROVIDER_SITE_OTHER): Payer: 59

## 2016-10-25 DIAGNOSIS — G459 Transient cerebral ischemic attack, unspecified: Secondary | ICD-10-CM

## 2016-10-26 NOTE — Telephone Encounter (Signed)
Patient's Doppler has been scheduled.

## 2017-01-29 ENCOUNTER — Observation Stay (HOSPITAL_COMMUNITY)
Admission: EM | Admit: 2017-01-29 | Discharge: 2017-01-30 | Disposition: A | Discharge status: Home / Self Care | Payer: BLUE CROSS/BLUE SHIELD | Attending: Internal Medicine | Admitting: Internal Medicine

## 2017-01-29 ENCOUNTER — Emergency Department (HOSPITAL_COMMUNITY): Payer: BLUE CROSS/BLUE SHIELD

## 2017-01-29 ENCOUNTER — Encounter (HOSPITAL_COMMUNITY): Payer: Self-pay

## 2017-01-29 ENCOUNTER — Inpatient Hospital Stay (HOSPITAL_COMMUNITY): Payer: BLUE CROSS/BLUE SHIELD

## 2017-01-29 DIAGNOSIS — Z7902 Long term (current) use of antithrombotics/antiplatelets: Secondary | ICD-10-CM | POA: Diagnosis not present

## 2017-01-29 DIAGNOSIS — Z825 Family history of asthma and other chronic lower respiratory diseases: Secondary | ICD-10-CM | POA: Diagnosis not present

## 2017-01-29 DIAGNOSIS — E7800 Pure hypercholesterolemia, unspecified: Secondary | ICD-10-CM | POA: Insufficient documentation

## 2017-01-29 DIAGNOSIS — R200 Anesthesia of skin: Secondary | ICD-10-CM

## 2017-01-29 DIAGNOSIS — Z79899 Other long term (current) drug therapy: Secondary | ICD-10-CM | POA: Insufficient documentation

## 2017-01-29 DIAGNOSIS — Z8673 Personal history of transient ischemic attack (TIA), and cerebral infarction without residual deficits: Secondary | ICD-10-CM | POA: Diagnosis not present

## 2017-01-29 DIAGNOSIS — I471 Supraventricular tachycardia: Secondary | ICD-10-CM | POA: Insufficient documentation

## 2017-01-29 DIAGNOSIS — R4701 Aphasia: Secondary | ICD-10-CM | POA: Diagnosis not present

## 2017-01-29 DIAGNOSIS — G459 Transient cerebral ischemic attack, unspecified: Secondary | ICD-10-CM | POA: Diagnosis not present

## 2017-01-29 DIAGNOSIS — Z820 Family history of epilepsy and other diseases of the nervous system: Secondary | ICD-10-CM

## 2017-01-29 DIAGNOSIS — G451 Carotid artery syndrome (hemispheric): Secondary | ICD-10-CM | POA: Diagnosis not present

## 2017-01-29 DIAGNOSIS — I639 Cerebral infarction, unspecified: Secondary | ICD-10-CM | POA: Diagnosis present

## 2017-01-29 DIAGNOSIS — E785 Hyperlipidemia, unspecified: Secondary | ICD-10-CM | POA: Insufficient documentation

## 2017-01-29 DIAGNOSIS — G9340 Encephalopathy, unspecified: Secondary | ICD-10-CM

## 2017-01-29 DIAGNOSIS — Z801 Family history of malignant neoplasm of trachea, bronchus and lung: Secondary | ICD-10-CM

## 2017-01-29 LAB — COMPREHENSIVE METABOLIC PANEL
ALBUMIN: 4 g/dL (ref 3.5–5.0)
ALT: 25 U/L (ref 17–63)
AST: 29 U/L (ref 15–41)
Alkaline Phosphatase: 37 U/L — ABNORMAL LOW (ref 38–126)
Anion gap: 10 (ref 5–15)
BILIRUBIN TOTAL: 1.1 mg/dL (ref 0.3–1.2)
BUN: 15 mg/dL (ref 6–20)
CHLORIDE: 100 mmol/L — AB (ref 101–111)
CO2: 25 mmol/L (ref 22–32)
Calcium: 8.8 mg/dL — ABNORMAL LOW (ref 8.9–10.3)
Creatinine, Ser: 0.95 mg/dL (ref 0.61–1.24)
GFR calc Af Amer: >60 mL/min (ref >60)
GFR calc non Af Amer: >60 mL/min (ref >60)
Glucose, Bld: 103 mg/dL — ABNORMAL HIGH (ref 65–99)
POTASSIUM: 4.1 mmol/L (ref 3.5–5.1)
SODIUM: 135 mmol/L (ref 135–145)
TOTAL PROTEIN: 6.7 g/dL (ref 6.5–8.1)

## 2017-01-29 LAB — I-STAT CHEM 8, ED
BUN: 17 mg/dL (ref 6–20)
CALCIUM ION: 1.05 mmol/L — AB (ref 1.15–1.40)
CHLORIDE: 99 mmol/L — AB (ref 101–111)
Creatinine, Ser: 0.9 mg/dL (ref 0.61–1.24)
Glucose, Bld: 101 mg/dL — ABNORMAL HIGH (ref 65–99)
HEMATOCRIT: 41 % (ref 39.0–52.0)
Hemoglobin: 13.9 g/dL (ref 13.0–17.0)
POTASSIUM: 4.1 mmol/L (ref 3.5–5.1)
SODIUM: 135 mmol/L (ref 135–145)
TCO2: 27 mmol/L (ref 0–100)

## 2017-01-29 LAB — CBC
HEMATOCRIT: 40.1 % (ref 39.0–52.0)
HEMOGLOBIN: 13.4 g/dL (ref 13.0–17.0)
MCH: 31.4 pg (ref 26.0–34.0)
MCHC: 33.4 g/dL (ref 30.0–36.0)
MCV: 93.9 fL (ref 78.0–100.0)
Platelets: 172 10*3/uL (ref 150–400)
RBC: 4.27 MIL/uL (ref 4.22–5.81)
RDW: 13.3 % (ref 11.5–15.5)
WBC: 6.2 10*3/uL (ref 4.0–10.5)

## 2017-01-29 LAB — CBG MONITORING, ED: GLUCOSE-CAPILLARY: 100 mg/dL — AB (ref 65–99)

## 2017-01-29 LAB — I-STAT TROPONIN, ED: Troponin i, poc: 0 ng/mL (ref 0.00–0.08)

## 2017-01-29 LAB — DIFFERENTIAL
BASOS ABS: 0 10*3/uL (ref 0.0–0.1)
Basophils Relative: 0 %
EOS ABS: 0.1 10*3/uL (ref 0.0–0.7)
EOS PCT: 1 %
LYMPHS ABS: 2.2 10*3/uL (ref 0.7–4.0)
Lymphocytes Relative: 36 %
Monocytes Absolute: 0.7 10*3/uL (ref 0.1–1.0)
Monocytes Relative: 11 %
Neutro Abs: 3.2 10*3/uL (ref 1.7–7.7)
Neutrophils Relative %: 52 %

## 2017-01-29 LAB — PROTIME-INR
INR: 1.15
Prothrombin Time: 14.8 seconds (ref 11.4–15.2)

## 2017-01-29 LAB — APTT: APTT: 30 s (ref 24–36)

## 2017-01-29 MED ORDER — CLOPIDOGREL BISULFATE 75 MG PO TABS
75.0000 mg | ORAL_TABLET | Freq: Every day | ORAL | Status: DC
  Administered 2017-01-30: 75 mg via ORAL
  Filled 2017-01-29: qty 1

## 2017-01-29 MED ORDER — GLUCOSAMINE 500 MG PO CAPS: 1.0000 | ORAL_CAPSULE | Freq: Every day | ORAL | Status: DC

## 2017-01-29 MED ORDER — ATORVASTATIN CALCIUM 40 MG PO TABS
40.0000 mg | ORAL_TABLET | Freq: Every day | ORAL | Status: DC
  Filled 2017-01-29: qty 1

## 2017-01-29 MED ORDER — STROKE: EARLY STAGES OF RECOVERY BOOK
Freq: Once | Status: DC
  Filled 2017-01-29: qty 1

## 2017-01-29 MED ORDER — LORATADINE 10 MG PO TABS
10.0000 mg | ORAL_TABLET | Freq: Every day | ORAL | Status: DC
  Filled 2017-01-29: qty 1

## 2017-01-29 MED ORDER — IOPAMIDOL (ISOVUE-370) INJECTION 76%
INTRAVENOUS | Status: AC
  Filled 2017-01-29: qty 100

## 2017-01-29 MED ORDER — ENOXAPARIN SODIUM 40 MG/0.4ML ~~LOC~~ SOLN
40.0000 mg | SUBCUTANEOUS | Status: DC
  Administered 2017-01-29: 40 mg via SUBCUTANEOUS
  Filled 2017-01-29: qty 0.4

## 2017-01-29 NOTE — Consult Note (Signed)
Neurology Consult Note  Reason for Consultation: CODE STROKE  Requesting provider: Carmin Muskrat, MD  CC: R hand numbness, difficulty speaking  HPI: This is a 33-yo RH man who presented to the ED for evaluation of R hand numbness and trouble speaking. History is obtained directly from the patient who is an excellent historian. His wife was present at the bedside and offered additional history as needed. I also reviewed his medical record at length.  The patient reports that he was at his usual state of health. He is at work this morning when he noticed the sudden onset of numbness in his right hand at about 1147. About 10-15 minutes later, he noticed that he is having difficulty expressing himself. He called his wife and she states that he was clearly not speaking normally. He did not appreciate any changes in his vision, difficulty swallowing, weakness, problems with coordination, balance difficulties, or gait changes. He presented to the emergency department for further evaluation. On presentation, he continued to have some expressive aphasia. Code stroke was activated by the triage nurse. I met the patient with the rest of the stroke team in the CT scanner. On my arrival, he was not having any further deficits on examination. NIH stroke scale score was 0. He estimates symptoms lasted less than 30 minutes. He was therefore not a candidate for thrombolytic therapy and tPA was not administered. Of note, during my examination, he was noted to have a transiently irregular heartbeat on telemetry with question of possible PACs versus paroxysmal atrial fibrillation.   The patient reports that he has had 2 previous episodes with essentially identical symptoms. These occurred in 2009 and 2015. He says that today's episode was less intense than the one that he had in 2015. He was admitted from 12/7-12/8/15 after presenting with tingling in his right face and right fingers along with "difficulty speaking with  garbled words." He was followed by the stroke team, including Dr. Antony Contras, during that admission. Workup during that admission included an MRI scan of the brain which revealed some hydrocephalus with increased ventricular size compared to previous imaging in 2009 but no acute ischemic pathology. Carotid Doppler showed no hemodynamically significant stenosis. MRA of the head was unremarkable. Transthoracic echocardiogram was also unremarkable. LDL was 83 and hemoglobin A1c was 5.5. He had been on aspirin 325 mg daily prior to that admission and was changed to Plavix 75 mg daily after discharge.   He followed up with Dr. Deri Fuelling, neurosurgery, for his hydrocephalus. He underwent placement of a ventriculoperitoneal shunt on 12/18/14 for management of a diagnosis of communicating hydrocephalus. According to Dr. Irven Baltimore notes, the patient had an intervening lumbar puncture that demonstrated "significantly elevated intracranial pressure." His shunt placement was uneventful. On 03/13/16, he presented to the emergency department after he had an episode of emesis and actually vomited up the peritoneal portion of his shunt which apparently spontaneously perforated his stomach or proximal duodenum. He underwent removal of the shunt on 03/13/16. This was contacted by hemorrhage in the right parietal region along the shunt track. He reports he has had follow-up imaging including MRI scans of the brain which showed no recurrence of his hydrocephalus with stable ventricular size.   He has had follow-up with Dr. Leonie Man since his 2015 admission and was last seen by him on 09/18/16. At that time he was tolerating Plavix without any difficulty. He was reporting some cognitive issues, difficulty walking, and difficulty with fine motor skills in the left hand  there were attributed to the right parietal hemorrhage. The plan was to continue Plavix and Lipitor. The patient reports that he has not had a Holter monitor or other  prolonged cardiac monitoring during his previous evaluations for TIA.  Last known well: 1147 today NHISS score: 0 mRS score: 0 tPA given?: No, symptoms resolved   PMH:  Past Medical History:  Diagnosis Date  . Hypercholesteremia   . Stroke ([place])    tia's  . TIA (transient ischemic attack)    09.15    PSH:  Past Surgical History:  Procedure Laterality Date  . Fractured arm Left 12  . HERNIA REPAIR Right 3/12  . SHUNT REMOVAL Right 03/13/2016   Procedure: SHUNT REMOVAL;  Surgeon: Earnie Larsson, MD;  Location: [facility];  Service: Neurosurgery;  Laterality: Right;  [name] VASECTOMY  10/02/1997  . VENTRICULOPERITONEAL SHUNT Right 12/18/2014   Procedure: Shunt Placment - right occipital VP shunt ;  Surgeon: Charlie Pitter, MD;  Location: [facility];  Service: Neurosurgery;  Laterality: Right;  Shunt Placment - right occipital VP shunt     Family history: Family History  Problem Relation Age of Onset  . COPD Mother   . Lung cancer Father   . Alzheimer's disease Father     Social history:  Social History   Social History  . Marital status: Married    Spouse name: N/A  . Number of children: 2  . Years of education: MASTERS   Occupational History  . Not on file.   Social History Main Topics  . Smoking status: Never Smoker  . Smokeless tobacco: Never Used  . Alcohol use 0.0 oz/week     Comment: ocassionally  . Drug use: No  . Sexual activity: Not on file   Other Topics Concern  . Not on file   Social History Narrative   Patient is married with 2 children.   Patient is right handed.   Patient has a Master's degree.   Patient drinks 24 oz of soda daily.    Current outpatient meds: Medications reviewed and reconciled No outpatient prescriptions have been marked as taking for the 01/29/17 encounter [facility] Encounter).    Current inpatient meds: Medications reviewed and reconciled Current Facility-Administered Medications  Medication Dose Route Frequency Provider  Last Rate Last Dose  . iopamidol (ISOVUE-370) 76 % injection            Current Outpatient Prescriptions  Medication Sig Dispense Refill  . atorvastatin (LIPITOR) 40 MG tablet Take 1 tablet (40 mg total) by mouth daily. 90 tablet 3  . clopidogrel (PLAVIX) 75 MG tablet Take 1 tablet (75 mg total) by mouth daily. 90 tablet 0  . ondansetron (ZOFRAN-ODT) 4 MG disintegrating tablet Take 4 mg by mouth every 8 (eight) hours as needed for nausea or vomiting.      Allergies: No Known Allergies  ROS: As per HPI. A full 14-point review of systems was performed and is otherwise unremarkable.  PE:  BP 138/89   Pulse 70   Temp 98.5 F (36.9 C) (Oral)   Resp 15   Ht 5' 10" (1.778 m)   Wt 88.5 kg (195 lb)   SpO2 99%   BMI 27.98 kg/m   General: WDWN, no acute distress. AAO x4. Speech clear, no dysarthria. No aphasia. Follows commands briskly. Affect is bright with congruent mood. Comportment is normal.  HEENT: Normocephalic. Neck supple without LAD. MMM, OP clear. Dentition good. Sclerae anicteric. No conjunctival injection.  CV: Regular,  no murmur. Carotid pulses full and symmetric, no bruits. Distal pulses 2+ and symmetric.  Lungs: CTAB.  Abdomen: Soft, non-distended, non-tender. Bowel sounds present x4.  Extremities: No C/C/E. Neuro:  CN: Pupils are equal and round. They are symmetrically reactive from 3-->2 mm. EOMI without nystagmus. No reported diplopia. Facial sensation is intact to light touch. Face is symmetric at rest with normal strength and mobility. Hearing is intact to conversational voice. Palate elevates symmetrically and uvula is midline. Voice is normal in tone, pitch and quality. Bilateral SCM and trapezii are 5/5. Tongue is midline with normal bulk and mobility.  Motor: Normal bulk, tone, and strength. No tremor or other abnormal movements. No drift.  Sensation: Intact to light touch and pinprick.  DTRs: 2+, symmetric. Toes downgoing bilaterally. No pathologic reflexes.   Coordination: Finger-to-nose and heel-to-shin are without dysmetria. Finger taps are normal in amplitude and speed, no decrement.  ty.   Labs:  Lab Results  Component Value Date   WBC 6.2 01/29/2017   HGB 13.9 01/29/2017   HCT 41.0 01/29/2017   PLT 172 01/29/2017   GLUCOSE 101 (H) 01/29/2017   CHOL 144 10/20/2014   TRIG 97 10/20/2014   HDL 42 10/20/2014   LDLDIRECT 106.2 07/23/2012   LDLCALC 83 10/20/2014   ALT 23 07/23/2012   AST 25 07/23/2012   NA 135 01/29/2017   K 4.1 01/29/2017   CL 99 (L) 01/29/2017   CREATININE 0.90 01/29/2017   BUN 17 01/29/2017   CO2 23 03/14/2016   TSH 2.23 07/23/2012   PSA 1.02 07/23/2012   INR 1.15 01/29/2017   HGBA1C 5.5 10/20/2014    Imaging:  I have personally and independently reviewed the CT scan of the head without contrast from today. This shows a focal area of encephalomalacia in the posterior aspect of the right parietal lobe, consistent with history of previous intracerebral hemorrhage. This extends down to the posterior lateral ventricle, consistent with shunt tract. Ventricles appear normal without significant ventriculomegaly. No significant changes when compared to her previous scan from 10/13/16. This was discussed rectally with interpreting radiologist at the time of the consult.  Assessment and Plan:  1. TIA: This is consistent with a TIA involving the L MCA territory. This would be the third such event since 2009 and he reports essentially identical symptoms each time. Given the stereotypical nature of these spells, seizure must also be considered this seems less likely. Known risk factors for cerebrovascular disease in this patient include previous TIAs. Additional workup will be ordered to include MRI brain, MRA of the head, carotid Dopplers, TTE, fasting lipids, and hemoglobin a1c. I will also order EEG. He had some irregular heartbeats in the emergency department though was not clearly an atrial fibrillation. Continue telemetry and  monitor. He may require more prolonged monitoring after discharge to exclude paroxysmal atrial fibrillation if no evidence of arrhythmia as identified during this admission. Further testing will be determined by results from these initial studies. Recommend continuing antiplatelet therapy with Plavix for secondary prevention. Continue statin with goal LDL less than 70. Ensure adequate glucose control. DVT prophylaxis as needed.   2. Expressive aphasia: By history, he had transient expressive aphasia which is now fully resolved, consistent with TIA. No intervention necessary. Continue to follow exam.  3. Right hand numbness: Again, consistent with TIA. Symptoms are now completely resolved. No acute intervention. Follow exam.   This was discussed with the patient and his wife. Education was provided on the diagnosis and expected evaluation  and treatment. They are in agreement with the plan as noted. They were given the opportunity to ask any questions and these were addressed to their satisfaction.   The above impression and recommendations are also discussed with the ED attending, Dr. Vanita Panda.  Thank you for this consultation. The stroke team will assume care of the patient beginning 01/30/17. Fortunately, his treating neurologist, Dr. Leonie Man, is covering the stroke service this week.  This patient is critically ill and at significant risk of neurological worsening, death and care requires constant monitoring of vital signs, hemodynamics,respiratory and cardiac monitoring, neurological assessment, discussion with family, other specialists and medical decision making of high complexity. A total of 65 minutes of critical care time was spent on this case.

## 2017-01-29 NOTE — Progress Notes (Signed)
EEG completed, results pending. 

## 2017-01-29 NOTE — H&P (Signed)
Date: 01/29/2017               Patient Name:  Charles Marquez MRN: [id number]  DOB: 1956-02-06 Age / Sex: 61 y.o., male   PCP: Eartha Inch, MD         Medical Service: Internal Medicine Teaching Service         Attending Physician: Dr. Earl Lagos    First Contact: Dr. Althia Forts Pager: [phone]  Second Contact: Dr. John Giovanni Pager: [phone]       After Hours (After 5p/  First Contact Pager: [phone]  weekends / holidays): Second Contact Pager: [phone]   Chief Complaint: Right hand numbness and speech difficulty  History of Present Illness: Charles Marquez is a 61 y.o. gentleman with PMH HLD, TIAs, and hydrocephalus who presented for hand numbness and expressive aphasia.  He was at work this morning and at 11:47 AM developed acute onset tingling and numbness in his right hand. A few minutes later he noticed difficulty getting the right words out, and immediately recognized these symptoms as a potential TIA/stroke. He called his daughter who noticed that his speech was abnormal. His comprehension remained intact, and he noticed no other neuro deficits. He went to his manager and asked to be taken to the hospital, and his manager agreed to drive him. By the time he had walked out to the car, around 12:05, his speech difficulty and hand numbness were starting to resolve. He has a history of two similar episodes that occurred in 2009 and 2015 with extensive stroke workup at that time. He has been compliant with Plavix and Lipitor since then.  On arrival to the ED code stroke was initiated. Neurology evaluated him and found neurologic deficits on exam, NIHSS 0. He did not receive tPA due to mild and resolved stroke symptoms. There was some concern for transient arrhythmia seen on the monitor. CT head prelim read showed no acute abnormality, but upon further evaluation was concerning for possible ganglionic infarction. IMTS was contacted for admission.   Meds:  Current Meds    Medication Sig  . atorvastatin (LIPITOR) 40 MG tablet Take 1 tablet (40 mg total) by mouth daily.  . cetirizine (ZYRTEC) 10 MG tablet Take 10 mg by mouth daily as needed for allergies.  [name] clopidogrel (PLAVIX) 75 MG tablet Take 1 tablet (75 mg total) by mouth daily.  . Glucosamine 500 MG CAPS Take 1 capsule by mouth daily.  . Multiple Vitamins-Minerals (OCUVITE ADULT 50+ PO) Take 1 capsule by mouth daily.   Allergies: Allergies as of 01/29/2017  . (No Known Allergies)   Past Medical History:  Diagnosis Date  . Hypercholesteremia   . Stroke (HCC)    tia's  . TIA (transient ischemic attack)    09.15   Family History:  Family History  Problem Relation Age of Onset  . COPD Mother   . Lung cancer Father   . Alzheimer's disease Father     Social History:  Social History   Social History  . Marital status: Married    Spouse name: N/A  . Number of children: 2  . Years of education: MASTERS   Occupational History  . Not on file.   Social History Main Topics  . Smoking status: Never Smoker  . Smokeless tobacco: Never Used  . Alcohol use 0.0 oz/week     Comment: ocassionally  . Drug use: No  . Sexual activity: Not on file   Other Topics Concern  .  Not on file   Social History Narrative   Patient is married with 2 children.   Patient is right handed.   Patient has a Master's degree.   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    Review of Systems: A complete ROS was negative except as per HPI.   Physical Exam: Blood pressure 138/89, pulse 70, temperature 98.5 F (36.9 C), temperature source Oral, resp. rate 15, height  (1.778 m), weight 195 lb (88.5 kg), SpO2 99 %.  General appearance: Elderly gentleman resting comfortably in bed, in no distress, pleasant and conversational HENT: Normocephalic, atraumatic, moist mucous membranes, neck supple, no JVD, no bruits Eyes: PERRL, EOM inact,  non-icteric Cardiovascular: Regular rate and rhythm, no murmurs, rubs, gallops Respiratory: Clear to auscultation bilaterally, normal work of breathing Abdomen: Soft, non-tender, non-distended Extremities: Warm and well-perfused, no edema, 2+ peripheral pulses Skin: Warm, dry, intact Neuro: Alert and oriented, cranial nerves grossly intact, strength and sensation grossly intact, coordination with FNF and HKS intact, gait deferred Psych: Appropriate affect, clear speech, thoughts linear and goal-directed  EKG: NSR  Assessment & Plan by Problem: [name] is a 61 yo gentleman with PMH HLD, TIA presenting with transient neurologic symptoms concerning for stroke/TIA.  Active Problems:   Acute cerebral infarction (HCC)  TIA, high functioning man presented from work with approximately 30 minutes of right hand numbness and expressive aphasia concerning for TIA/stroke. His symptoms resolved by arrival an no tPA was given, NIHSS 0. History of TIA in 2009 and 2015 with similar symptoms. Compliant with Plavix and statin since. Neurology is following. CT head concerning for ganglionic and supraganglionic level infarct with ASPECTS score 7, the read does not specify which side of the brain this is seen on. Patient has history of VP shunt and transient hemorrhage after it was removed. EEG completed and revealed focal slowing in the right hemisphere, no epileptiform activity.  -- Follow MRI/MRA -- Obtain TTE, carotid dopplers -- Follow Lipid panel, HbA1c, and  -- Telemetry to monitor for arrhythmia, may require prolonged monitoring with Holter or loop recorder -- Continue Plavix 75 mg daily  -- PT/OT eval  HLD -- Continue Lipitor 40 mg daily  FEN/GI: HH diet, replete electrolytes as needed  DVT ppx: Lovenox  Code status: Full  Dispo: Admit patient to Observation with expected length of stay less than 2 midnights.  Signed: Althia Forts, MD 01/29/2017, 2:42 PM  Pager: [phone]

## 2017-01-29 NOTE — Procedures (Signed)
HPI:  61 y/o with MS change  TECHNICAL SUMMARY:  A multichannel referential and bipolar montage EEG using the standard international 10-20 system was performed on the patient described as awake and drowsy.  The dominant background activity consists of a very low voltage 11 hertz activity seen most prominantly over the posterior head region.  The backgound activity is reactive to eye opening and closing procedures.  4-5 Hz intermittent slowing is noted in the right hemisphere.  Low voltage fast (beta) activity is distributed symmetrically and maximally over the anterior head regions.  ACTIVATION:  Stepwise photic stimulation and hyperventilation are not performed  EPILEPTIFORM ACTIVITY:  There were no spikes, sharp waves or paroxysmal activity.  SLEEP:  Brief physiologic drowsiness is noted.   IMPRESSION:  This is an abnormal EEG demonstrating focal slowing in the right hemisphere compared to that of the left.  If not already done so, a focal lesion in this area should be ruled out.  No epileptiform activity was noted on this EEG, but this does not exclude the diagnosis of a seizure disorder and if seizure remains high on the list of Ddx, an ambulatory EEG may be of value.

## 2017-01-29 NOTE — ED Provider Notes (Signed)
[facility]-EMERGENCY DEPT Provider Note   CSN: [id number] Arrival date & time: 01/29/17  1216   An emergency department physician performed an initial assessment on this suspected stroke patient at 1219.  History   Chief Complaint Chief Complaint  Patient presents with  . Code Stroke    HPI Charles Marquez is a 61 y.o. male.  HPI  Patient presents as a code stroke. About one hour prior to ED arrival the patient felt speech difficulty, right hand numbness. Patient was at work, in his usual state of health when this occurred. On arrival the patient is almost back to normal, is awake, alert. Initial evaluation performed on arrival, with subsequent evaluation performed after the patient had emergent head CT. Initially the patient's family arrived, they note that the patient  has notable history of prior stroke, TIA, has previously had intracranial shunt, though this was removed about one year ago. Patient apparently take Plavix as directed. Today the symptoms lasted about one hour, began without a clear precipitant, stop without any specific intervention.     Past Medical History:  Diagnosis Date  . Hypercholesteremia   . Stroke (HCC)    tia's  . TIA (transient ischemic attack)    09.15    Patient Active Problem List   Diagnosis Date Noted  . Shunt malfunction 03/13/2016  . 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 Hyperlipidemia 10/20/2014  . TIA (transient ischemic attack) 10/20/2014  . Degenerative disc disease, lumbar 04/15/2013  . Routine general medical examination at a health care facility 07/23/2012  . DISTURBANCE OF SKIN SENSATION 10/05/2008  . HYPERLIPIDEMIA 01/01/2008  . MYCOPLASMA PNEUMONIA 01/01/2008    Past Surgical History:  Procedure Laterality Date  . Fractured arm Left 12  . HERNIA REPAIR Right 3/12  . SHUNT REMOVAL Right 03/13/2016   Procedure: SHUNT REMOVAL;  Surgeon: Julio SicksHenry Pool, MD;  Location: [facility];  Service: Neurosurgery;  Laterality: Right;  [name]. VASECTOMY  10/02/1997  . 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 Authorizing Provider  atorvastatin (LIPITOR) 40 MG tablet Take 1 tablet (40 mg total) by mouth daily. 11/19/14   Roderick PeeJeffrey A Todd, MD  clopidogrel (PLAVIX) 75 MG tablet Take 1 tablet (75 mg total) by mouth daily. 01/14/15   Roderick PeeJeffrey A Todd, MD  ondansetron (ZOFRAN-ODT) 4 MG disintegrating tablet Take 4 mg by mouth every 8 (eight) hours as needed for nausea or vomiting.    Historical Provider, MD    Family History Family History  Problem Relation Age of Onset  . COPD Mother   . Lung cancer Father   . Alzheimer's disease Father     Social History Social History  Substance Use Topics  . Smoking status: Never Smoker  . Smokeless tobacco: Never Used  . Alcohol use 0.0 oz/week     Comment: ocassionally     Allergies   Patient has no known allergies.   Review of Systems Review of Systems  Constitutional:       Per HPI, otherwise negative  HENT:       Per HPI, otherwise negative  Respiratory:       Per HPI,  otherwise negative  Cardiovascular:       Per HPI, otherwise negative  Gastrointestinal: Negative for vomiting.  Endocrine:       Negative aside from HPI  Genitourinary:       Neg aside from HPI   Musculoskeletal:       Per HPI, otherwise negative  Skin: Negative.   Neurological: Positive for speech difficulty. Negative for syncope.     Physical Exam Updated Vital Signs BP 138/89   Pulse 70   Temp 98.5 F (36.9 C) (Oral)   Resp 15   Ht 5\' 10"  (1.778 m)   Wt 195 lb (88.5 kg)   SpO2 99%   BMI 27.98 kg/m   Physical Exam  Constitutional: He  is oriented to person, place, and time. He appears well-developed. No distress.  HENT:  Head: Normocephalic and atraumatic.  Eyes: Conjunctivae and EOM are normal.  Cardiovascular: Normal rate and regular rhythm.   Pulmonary/Chest: Effort normal. No stridor. No respiratory distress.  Abdominal: He exhibits no distension.  Musculoskeletal: He exhibits no edema.  Neurological: He is alert and oriented to person, place, and time. He displays no atrophy. No cranial nerve deficit. He exhibits normal muscle tone. He displays no seizure activity.  Skin: Skin is warm and dry.  Psychiatric: He has a normal mood and affect.  Nursing note and vitals reviewed.    ED Treatments / Results  Labs (all labs ordered are listed, but only abnormal results are displayed) Labs Reviewed  CBG MONITORING, ED - Abnormal; Notable for the following:       Result Value   Glucose-Capillary 100 (*)    All other components within normal limits  I-STAT CHEM 8, ED - Abnormal; Notable for the following:    Chloride 99 (*)    Glucose, Bld 101 (*)    Calcium, Ion 1.05 (*)    All other components within normal limits  PROTIME-INR  APTT  CBC  DIFFERENTIAL  COMPREHENSIVE METABOLIC PANEL  I-STAT TROPOININ, ED    EKG  EKG Interpretation  Date/Time:  Monday January 29 2017 12:39:28 EDT Ventricular Rate:  67 PR Interval:    QRS Duration: 86 QT Interval:  425 QTC Calculation: 449 R Axis:   44 Text Interpretation:  Sinus rhythm Artifact Abnormal ekg Confirmed by Gerhard Munch  MD [phone]) on 01/29/2017 12:50:24 PM       Radiology Ct Head Code Stroke W/o Cm  Addendum Date: 01/29/2017   ADDENDUM REPORT: 01/29/2017 12:50 ADDENDUM: These results were called by telephone at the time of interpretation on 01/29/2017 at 12:50 pm to Dr. Roxy Manns, who verbally acknowledged these results. Electronically Signed   By: Marlan Palau M.D.   On: 01/29/2017 12:50   Result Date: 01/29/2017 CLINICAL DATA:  Code stroke.  Right  hand weakness.  Aphasia. EXAM: CT HEAD WITHOUT CONTRAST TECHNIQUE: Contiguous axial images were obtained from the base of the skull through the vertex without intravenous contrast. COMPARISON:  CT head 10/13/2016 FINDINGS: Brain: Encephalomalacia right parietal lobe unchanged related to prior hemorrhage. The patient previously had a VP shunt in this area. Ventricle size normal. Negative for acute infarct. No acute hemorrhage or mass. Mild chronic microvascular ischemic change in the frontal white matter bilaterally. Vascular: No hyperdense vessel or unexpected calcification. Skull: Right parietal burr hole.  No acute skeletal abnormality. Sinuses/Orbits: Negative Other: None ASPECTS (Alberta Stroke Program Early CT Score) - Ganglionic level infarction (caudate, lentiform nuclei, internal capsule, insula, M1-M3 cortex): 7 - Supraganglionic infarction (M4-M6 cortex):  3 Total score (0-10 with 10 being normal): 10 IMPRESSION: 1. Negative for acute infarct 2. ASPECTS is 10 Electronically Signed: By: Marlan Palau M.D. On: 01/29/2017 12:40    Patient had his initial evaluation performed with our neurology team.  Procedures Procedures (including critical care time)  Medications Ordered in ED Medications  iopamidol (ISOVUE-370) 76 % injection (not administered)   stroke: mapping our early stages of recovery book (not administered)  clopidogrel (PLAVIX) tablet 75 mg (not administered)  atorvastatin (LIPITOR) tablet 40 mg (not administered)     Initial Impression / Assessment and Plan / ED Course  I have reviewed the triage vital signs and the nursing notes.  Pertinent labs & imaging results that were available during my care of the patient were reviewed by me and considered in my medical decision making (see chart for details).  On repeat exam the patient is awake and alert, in no distress, still has no persistent neurologic changes. Family and the patient aware of all findings. Patient will be  admitted for further evaluation, management of likely TIA.   Final Clinical Impressions(s) / ED Diagnoses  Speech difficulty   Gerhard Munch, MD 01/29/17 1512

## 2017-01-29 NOTE — ED Notes (Signed)
Pt in irregular heart rhythm at this time and has no hx

## 2017-01-29 NOTE — ED Triage Notes (Signed)
Pt presents to ED with right hand numbness and expressive aphasia that began at 1147 while working. Code stroke called. Pt has hx of tia's and is on blood thinners. Pt has no other deficits at this time.

## 2017-01-30 ENCOUNTER — Other Ambulatory Visit (HOSPITAL_COMMUNITY): Payer: 59

## 2017-01-30 ENCOUNTER — Other Ambulatory Visit: Payer: Self-pay | Admitting: Nurse Practitioner

## 2017-01-30 ENCOUNTER — Inpatient Hospital Stay (HOSPITAL_BASED_OUTPATIENT_CLINIC_OR_DEPARTMENT_OTHER): Payer: BLUE CROSS/BLUE SHIELD

## 2017-01-30 ENCOUNTER — Observation Stay (HOSPITAL_BASED_OUTPATIENT_CLINIC_OR_DEPARTMENT_OTHER): Payer: BLUE CROSS/BLUE SHIELD

## 2017-01-30 DIAGNOSIS — G459 Transient cerebral ischemic attack, unspecified: Secondary | ICD-10-CM

## 2017-01-30 DIAGNOSIS — G458 Other transient cerebral ischemic attacks and related syndromes: Secondary | ICD-10-CM

## 2017-01-30 DIAGNOSIS — I471 Supraventricular tachycardia: Secondary | ICD-10-CM

## 2017-01-30 LAB — VAS US CAROTID
LCCAPDIAS: 41 cm/s
LEFT ECA DIAS: -27 cm/s
LEFT VERTEBRAL DIAS: 14 cm/s
LICADDIAS: -35 cm/s
LICAPDIAS: -27 cm/s
LICAPSYS: -75 cm/s
Left CCA dist dias: -31 cm/s
Left CCA dist sys: -77 cm/s
Left CCA prox sys: 128 cm/s
Left ICA dist sys: -71 cm/s
RIGHT ECA DIAS: -21 cm/s
RIGHT VERTEBRAL DIAS: -18 cm/s
Right CCA prox dias: -25 cm/s
Right CCA prox sys: -93 cm/s
Right cca dist sys: -39 cm/s

## 2017-01-30 LAB — LIPID PANEL
CHOL/HDL RATIO: 2.8 ratio
CHOLESTEROL: 114 mg/dL (ref 0–200)
HDL: 41 mg/dL (ref >40)
LDL Cholesterol: 47 mg/dL (ref 0–99)
TRIGLYCERIDES: 131 mg/dL (ref <150)
VLDL: 26 mg/dL (ref 0–40)

## 2017-01-30 LAB — ECHOCARDIOGRAM COMPLETE
Height: 70 in
WEIGHTICAEL: 3120 [oz_av]

## 2017-01-30 LAB — HIV ANTIBODY (ROUTINE TESTING W REFLEX): HIV Screen 4th Generation wRfx: NONREACTIVE

## 2017-01-30 NOTE — Care Management Note (Signed)
Case Management Note  Patient Details  Name: Charles Marquez MRN: [id number] Date of Birth: [birth year]  Subjective/Objective:                    Action/Plan: Pt discharging home with self care. No f/u or DME needs per PT. Pt has insurance and PCP. No further needs per CM.   Expected Discharge Date:                  Expected Discharge Plan:  Home/Self Care  In-House Referral:     Discharge planning Services     Post Acute Care Choice:    Choice offered to:     DME Arranged:    DME Agency:     HH Arranged:    HH Agency:     Status of Service:  Completed, signed off  If discussed at [employer] Length of Stay Meetings, dates discussed:    Additional Comments:  Kermit BaloKelli F Jaylenne Hamelin, RN 01/30/2017, 4:00 PM

## 2017-01-30 NOTE — Discharge Summary (Signed)
Name: Charles Marquez MRN: [id number] DOB: 1956/02/09 61 y.o. PCP: Eartha Inch, MD  Date of Admission: 2017-02-02 12:21 PM Date of Discharge: 01/30/2017 Attending Physician: Earl Lagos, MD  Discharge Diagnosis: 1. TIA  Principal Problem:   TIA (transient ischemic attack)   Discharge Medications: Allergies as of 01/30/2017   No Known Allergies     Medication List    TAKE these medications   atorvastatin 40 MG tablet Commonly known as:  LIPITOR Take 1 tablet (40 mg total) by mouth daily.   cetirizine 10 MG tablet Commonly known as:  ZYRTEC Take 10 mg by mouth daily as needed for allergies.   clopidogrel 75 MG tablet Commonly known as:  PLAVIX Take 1 tablet (75 mg total) by mouth daily.   Glucosamine 500 MG Caps Take 1 capsule by mouth daily.   OCUVITE ADULT 50+ PO Take 1 capsule by mouth daily.       Disposition and follow-up:   Charles Marquez was discharged from [facility] in Stable condition.  At the hospital follow up visit please address:  1.  TIA - assess for any recurrent neurologic symptoms and any documented arrhythmias on cardiac monitor  2.  Labs / imaging needed at time of follow-up: None  3.  Pending labs/ test needing follow-up: HbA1c  Follow-up Appointments: Follow-up Information    BADGER,MICHAEL C, MD. Schedule an appointment as soon as possible for a visit.   Specialty:  Family Medicine Contact information: [address] [place] [address] [phone]        Charles Heady, MD. Schedule an appointment as soon as possible for a visit.   Specialties:  Neurology, Radiology Contact information: [address] [place] [address] [phone]           Hospital Course by problem list: Principal Problem:   TIA (transient ischemic attack)   1. TIA Charles Marquez is a 61 y.o. gentleman with PMH HLD, TIA (2009, 2015), and hydrocephalus (s/p VP shunt and removal) who presented to the ED on 03-Feb-2023  following a transient episode of hand numbness and expressive aphasia. Code stroke was initiated and neurology evaluated him in the ED. His symptoms had resolved on arrival and he had an NIHSS of 0. No tPA was given. CT head prelim read showed no acute abnormality but was later edited with concern for infarction. He was admitted for stroke/TIA workup. EEG revealed no seizure activity but some focal slowing in right hemisphere. MRI/MRA brain revealed no acute intracranial infarct and some sequela of remote right parietal hemorrhage that occurred when his VP shunt was removed in May 2017. Patient reported compliance with Plavix and Lipitor since previous TIA and these were continued throughout his hospital stay. Echocardiogram revealed stable normal LVEF and grade 2 diastolic dysfunction. Carotid dopplers revealed no significant stenosis.Telemetry monitoring revealed a brief episode of ectopic atrial tachycardia and electrophysiology was consulted and arranged for the patient to receive a 30 day event monitor on discharge to evaluate for atrial fibrillation. He remained asymptomatic and was deemed stable for discharge on 3/20 with PCP, neurology, and EP follow up.  Discharge Vitals:   BP 106/62 (BP Location: Right Arm)   Pulse 60   Temp 97.9 F (36.6 C) (Oral)   Resp 18   Ht 5\' 10"  (1.778 m)   Wt 195 lb (88.5 kg)   SpO2 99%   BMI 27.98 kg/m   Pertinent Labs, Studies, and Procedures:   EEG - 02-02-2017 IMPRESSION: This is  an abnormal EEG demonstrating focal slowing in the right hemisphere compared to that of the left.  If not already done so, a focal lesion in this area should be ruled out.  No epileptiform activity was noted on this EEG, but this does not exclude the diagnosis of a seizure disorder and if seizure remains high on the list of Ddx, an ambulatory EEG may be of value.    TTE - 01/30/17 Study Conclusions - Left ventricle: The cavity size was normal. Wall thickness was normal. Systolic  function was normal. The estimated ejection fraction was in the range of 60% to 65%. Wall motion was normal; there were no regional wall motion abnormalities. Features are consistent with a pseudonormal left ventricular filling pattern, with concomitant abnormal relaxation and increased filling pressure (grade 2 diastolic dysfunction).  Carotid doppler US 01/30/17 - prelim findings bilateral 1-39% ICA stenosis, antegrade vertebral flow  [name] Contrast  Result Date: 01/29/2017 CLINICAL DATA:  Initial evaluation for acute aphasia, right hand numbness. EXAM: MRI HEAD WITHOUT CONTRAST MRA HEAD WITHOUT CONTRAST TECHNIQUE: Multiplanar, multiecho pulse sequences of the brain and surrounding structures were obtained without intravenous contrast. Angiographic images of the head were obtained using MRA technique without contrast. COMPARISON:  Prior CT from earlier the same day. FINDINGS: MRI HEAD FINDINGS Brain: Encephalomalacia with chronic hemorrhagic blood products present within the right parietal lobe, consistent with remote hemorrhage seen within this region. Mild chronic microvascular ischemic disease. No evidence for acute infarct. Gray-white matter differentiation maintained. No other evidence for acute or chronic intracranial hemorrhage. No other areas of chronic infarction identified. No mass lesion, midline shift, or mass effect. No hydrocephalus. No extra-axial fluid collection. Major dural sinuses are grossly patent. Pituitary gland and suprasellar region within normal limits. Vascular: Major intracranial vascular flow voids maintained. Skull and upper cervical spine: Craniocervical junction normal. Visualized upper cervical spine unremarkable. Bone marrow signal intensity within normal limits. No scalp soft tissue abnormality. Sinuses/Orbits: Globes and orbital soft tissues within normal limits. Paranasal sinuses are clear. Right mastoid effusion noted. Inner ear structures normal. Other: No other  significant finding. MRA HEAD FINDINGS ANTERIOR CIRCULATION: Distal cervical segments of the internal carotid arteries are widely patent with antegrade flow. Petrous, cavernous, and supraclinoid segments of the internal carotid arteries are widely patent without flow limiting stenosis. A1 segments widely patent. Left A1 segment hypoplastic. Anterior communicating artery normal. Anterior cerebral arteries widely patent to their distal aspects. M1 segments widely patent without stenosis or occlusion. MCA bifurcations normal. Distal MCA branches well opacified and symmetric. POSTERIOR CIRCULATION: Vertebral arteries widely patent to the vertebrobasilar junction. Right vertebral artery dominant. Posterior inferior cerebral arteries patent bilaterally. Basilar artery widely patent. Superior cerebral arteries patent bilaterally. Right PCA predominantly supplied via the basilar artery and is well opacified to its distal aspect. Left PCA predominantly supplied via a prominent left posterior communicating artery. Left PCA also widely patent to its distal aspect. No aneurysm or vascular malformation. IMPRESSION: MRI HEAD IMPRESSION: 1. No acute intracranial infarct or other process identified. 2. Sequela of remote right parietal hemorrhage. MRA HEAD IMPRESSION: Normal intracranial MRA. Electronically Signed   By: Rise Mu M.D.   On: 01/29/2017 21:03   Mr Maxine Glenn Headm  Result Date: 01/29/2017 CLINICAL DATA:  Initial evaluation for acute aphasia, right hand numbness. EXAM: MRI HEAD WITHOUT CONTRAST MRA HEAD WITHOUT CONTRAST TECHNIQUE: Multiplanar, multiecho pulse sequences of the brain and surrounding structures were obtained without intravenous contrast. Angiographic images of the head were obtained using  MRA technique without contrast. COMPARISON:  Prior CT from earlier the same day. FINDINGS: MRI HEAD FINDINGS Brain: Encephalomalacia with chronic hemorrhagic blood products present within the right parietal lobe,  consistent with remote hemorrhage seen within this region. Mild chronic microvascular ischemic disease. No evidence for acute infarct. Gray-white matter differentiation maintained. No other evidence for acute or chronic intracranial hemorrhage. No other areas of chronic infarction identified. No mass lesion, midline shift, or mass effect. No hydrocephalus. No extra-axial fluid collection. Major dural sinuses are grossly patent. Pituitary gland and suprasellar region within normal limits. Vascular: Major intracranial vascular flow voids maintained. Skull and upper cervical spine: Craniocervical junction normal. Visualized upper cervical spine unremarkable. Bone marrow signal intensity within normal limits. No scalp soft tissue abnormality. Sinuses/Orbits: Globes and orbital soft tissues within normal limits. Paranasal sinuses are clear. Right mastoid effusion noted. Inner ear structures normal. Other: No other significant finding. MRA HEAD FINDINGS ANTERIOR CIRCULATION: Distal cervical segments of the internal carotid arteries are widely patent with antegrade flow. Petrous, cavernous, and supraclinoid segments of the internal carotid arteries are widely patent without flow limiting stenosis. A1 segments widely patent. Left A1 segment hypoplastic. Anterior communicating artery normal. Anterior cerebral arteries widely patent to their distal aspects. M1 segments widely patent without stenosis or occlusion. MCA bifurcations normal. Distal MCA branches well opacified and symmetric. POSTERIOR CIRCULATION: Vertebral arteries widely patent to the vertebrobasilar junction. Right vertebral artery dominant. Posterior inferior cerebral arteries patent bilaterally. Basilar artery widely patent. Superior cerebral arteries patent bilaterally. Right PCA predominantly supplied via the basilar artery and is well opacified to its distal aspect. Left PCA predominantly supplied via a prominent left posterior communicating artery. Left  PCA also widely patent to its distal aspect. No aneurysm or vascular malformation. IMPRESSION: MRI HEAD IMPRESSION: 1. No acute intracranial infarct or other process identified. 2. Sequela of remote right parietal hemorrhage. MRA HEAD IMPRESSION: Normal intracranial MRA. Electronically Signed   By: Rise MuBenjamin  McClintock M.D.   On: 01/29/2017 21:03   Ct Head Code Stroke W/o Cm  Addendum Date: 01/29/2017   ADDENDUM REPORT: 01/29/2017 12:50 ADDENDUM: These results were called by telephone at the time of interpretation on 01/29/2017 at 12:50 pm to Dr. Roxy Mannsster, who verbally acknowledged these results. Electronically Signed   By: Marlan Palauharles  Clark M.D.   On: 01/29/2017 12:50   Result Date: 01/29/2017 CLINICAL DATA:  Code stroke.  Right hand weakness.  Aphasia. EXAM: CT HEAD WITHOUT CONTRAST TECHNIQUE: Contiguous axial images were obtained from the base of the skull through the vertex without intravenous contrast. COMPARISON:  CT head 10/13/2016 FINDINGS: Brain: Encephalomalacia right parietal lobe unchanged related to prior hemorrhage. The patient previously had a VP shunt in this area. Ventricle size normal. Negative for acute infarct. No acute hemorrhage or mass. Mild chronic microvascular ischemic change in the frontal white matter bilaterally. Vascular: No hyperdense vessel or unexpected calcification. Skull: Right parietal burr hole.  No acute skeletal abnormality. Sinuses/Orbits: Negative Other: None ASPECTS (Alberta Stroke Program Early CT Score) - Ganglionic level infarction (caudate, lentiform nuclei, internal capsule, insula, M1-M3 cortex): 7 - Supraganglionic infarction (M4-M6 cortex): 3 Total score (0-10 with 10 being normal): 10 IMPRESSION: 1. Negative for acute infarct 2. ASPECTS is 10 Electronically Signed: By: Marlan Palauharles  Clark M.D. On: 01/29/2017 12:40     Discharge Instructions: Discharge Instructions    Diet - low sodium heart healthy    Complete by:  As directed    Discharge instructions     Complete by:  As directed  The episode that brought you to the hospital is consistent with another transient ischemia attack (TIA). We remain unsure what is causing these recurrent episodes, but suspect that you may have an arrhythmia that we have been unable to capture and record during your hospital stays.  You are taking the right medications to reduce your risk of stroke. We have made no changes. Please continue to maintain a healthy level of activity and diet.   Please follow up with our electrophysiologist / cardiology to have a 30 day cardiac monitor placed. They will call to arrange this shortly after discharge.  Please follow up with your PCP and neurologist (Dr. Pearlean Brownie) as well.   Increase activity slowly    Complete by:  As directed       Signed: Althia Forts, MD 01/30/2017, 4:32 PM   Pager: [phone]

## 2017-01-30 NOTE — Progress Notes (Signed)
Patient states he will take lipitor at home

## 2017-01-30 NOTE — Care Management Note (Signed)
Case Management Note  Patient Details  Name: Charles Marquez MRN: [id number] Date of Birth: 01-Oct-1956  Subjective/Objective:    Patient in with TIA. He is from home with his spouse.                Action/Plan: No f/u per PT. Awaiting OT recs. CM following for d/c needs, physician orders.  Expected Discharge Date:                  Expected Discharge Plan:  Home/Self Care  In-House Referral:     Discharge planning Services     Post Acute Care Choice:    Choice offered to:     DME Arranged:    DME Agency:     HH Arranged:    HH Agency:     Status of Service:  In process, will continue to follow  If discussed at Long Length of Stay Meetings, dates discussed:    Additional Comments:  Kermit BaloKelli F Naiah Donahoe, RN 01/30/2017, 12:43 PM

## 2017-01-30 NOTE — Progress Notes (Signed)
  Echocardiogram 2D Echocardiogram has been performed.  Arvil ChacoFoster, Beautifull Cisar 01/30/2017, 4:58 PM

## 2017-01-30 NOTE — Evaluation (Signed)
Physical Therapy Evaluation/Discharge Patient Details Name: Charles Marquez MRN: [id number] DOB: Sep 26, 1956 Today's Date: 01/30/2017   History of Present Illness     Clinical Impression  Pt functioning at baseline level and safe for d/c home when medically ready. Pt at minimal risk of falls as indicated by DGI score of 24/24. Pt educated on signs and symptoms of stroke and encouraged to seek immediate medical attention in presence of signs and symptoms if they should arise in the future. Pt with no further acute PT needs at this time. Please re-consult if needed. Acute PT signing off.     Follow Up Recommendations No PT follow up    Equipment Recommendations  None recommended by PT    Recommendations for Other Services       Precautions / Restrictions Precautions Precautions: Fall Restrictions Weight Bearing Restrictions: No      Mobility  Bed Mobility Overal bed mobility: Independent                Transfers Overall transfer level: Independent Equipment used: None                Ambulation/Gait Ambulation/Gait assistance: Modified independent (Device/Increase time) Ambulation Distance (Feet): 180 Feet Assistive device: None Gait Pattern/deviations: WFL(Within Functional Limits)   Gait velocity interpretation: at or above normal speed for age/gender General Gait Details: good speed, WFL  Stairs Stairs: Yes Stairs assistance: Modified independent (Device/Increase time) Stair Management: No rails;Alternating pattern;Forwards Number of Stairs: 10 General stair comments: increased time  Wheelchair Mobility    Modified Rankin (Stroke Patients Only) Modified Rankin (Stroke Patients Only) Pre-Morbid Rankin Score: No symptoms Modified Rankin: No symptoms     Balance Overall balance assessment: Modified Independent                               Standardized Balance Assessment Standardized Balance Assessment : Dynamic Gait Index   Dynamic  Gait Index Level Surface: Normal Change in Gait Speed: Normal Gait with Horizontal Head Turns: Normal Gait with Vertical Head Turns: Normal Gait and Pivot Turn: Normal Step Over Obstacle: Normal Step Around Obstacles: Normal Steps: Normal Total Score: 24       Pertinent Vitals/Pain Pain Assessment: No/denies pain    Home Living Family/patient expects to be discharged to:: Private residence Living Arrangements: Spouse/significant other Available Help at Discharge: Family;Available 24 hours/day Type of Home: House Home Access: Stairs to enter Entrance Stairs-Rails: Can reach both Entrance Stairs-Number of Steps: 3-4 Home Layout: Two level Home Equipment: Shower seat      Prior Function Level of Independence: Independent         Comments: referee basketball and umpires baseball     Hand Dominance   Dominant Hand: Right    Extremity/Trunk Assessment   Upper Extremity Assessment Upper Extremity Assessment: Overall WFL for tasks assessed    Lower Extremity Assessment Lower Extremity Assessment: Overall WFL for tasks assessed    Cervical / Trunk Assessment Cervical / Trunk Assessment: Normal  Communication   Communication: No difficulties  Cognition Arousal/Alertness: Awake/alert Behavior During Therapy: WFL for tasks assessed/performed Overall Cognitive Status: Within Functional Limits for tasks assessed                      General Comments      Exercises     Assessment/Plan    PT Assessment Patent does not need any further PT services  PT Problem List  PT Treatment Interventions      PT Goals (Current goals can be found in the Care Plan section)  Acute Rehab PT Goals Patient Stated Goal: return home PT Goal Formulation: With patient Time For Goal Achievement: 02/13/17 Potential to Achieve Goals: Good    Frequency     Barriers to discharge        Co-evaluation               End of Session Equipment Utilized  During Treatment: Gait belt Activity Tolerance: Patient tolerated treatment well Patient left: in bed;with call bell/phone within reach;with family/visitor present Nurse Communication: Mobility status PT Visit Diagnosis: Other abnormalities of gait and mobility (R26.89)         Time: 1610-9604 PT Time Calculation (min) (ACUTE ONLY): 21 min   Charges:   PT Evaluation $PT Eval Low Complexity: 1 Procedure     PT G CodesLane Hacker 02/19/2017, 11:34 AM   Lane Hacker, SPT Acute Rehab SPT [phone]

## 2017-01-30 NOTE — Progress Notes (Signed)
*  PRELIMINARY RESULTS* Vascular Ultrasound Carotid Duplex (Doppler) has been completed.  Preliminary findings: Bilateral 1-39% ICA stenosis, antegrade vertebral flow.    Charles FischerCharlotte C Ahmed Marquez 01/30/2017, 10:48 AM

## 2017-01-30 NOTE — Progress Notes (Signed)
Patient arrived around 2100 alert and oriented no neurological deficits, wife and daughter with him, wife staying the night will start Q 2's he has no complaints or issues at this time.

## 2017-01-30 NOTE — Progress Notes (Signed)
STROKE TEAM PROGRESS NOTE   SUBJECTIVE (INTERVAL HISTORY) His wife is at the bedside.  He is a clinic pt of Dr. Marlis Edelson   OBJECTIVE Temp:  [98 F (36.7 C)-98.4 F (36.9 C)] 98.1 F (36.7 C) (03/20 1007) Pulse Rate:  [54-84] 84 (03/20 1007) Cardiac Rhythm: Sinus bradycardia (03/20 0734) Resp:  [10-19] 18 (03/20 1007) BP: (105-149)/(68-105) 105/68 (03/20 1007) SpO2:  [96 %-100 %] 97 % (03/20 1007) FiO2 (%):  [21 %] 21 % (03/19 1325)  CBC:   Recent Labs Lab 01/29/17 1233 01/29/17 1243  WBC 6.2  --   NEUTROABS 3.2  --   HGB 13.4 13.9  HCT 40.1 41.0  MCV 93.9  --   PLT 172  --     Basic Metabolic Panel:   Recent Labs Lab 01/29/17 1233 01/29/17 1243  NA 135 135  K 4.1 4.1  CL 100* 99*  CO2 25  --   GLUCOSE 103* 101*  BUN 15 17  CREATININE 0.95 0.90  CALCIUM 8.8*  --     HgbA1c:  Lab Results  Component Value Date   HGBA1C 5.5 10/20/2014    PHYSICAL EXAM Pleasant middle aged male not in distress. . Afebrile. Head is nontraumatic. Neck is supple without bruit.    Cardiac exam no murmur or gallop. Lungs are clear to auscultation. Distal pulses are well felt. Neurological Exam ;  Awake  Alert oriented x 3. Normal speech and language.eye movements full without nystagmus.fundi were not visualized. Vision acuity and fields appear normal. Hearing is normal. Palatal movements are normal. Face symmetric. Tongue midline. Normal strength, tone, reflexes and coordination. Normal sensation. Gait deferred.  ASSESSMENT/PLAN Charles Marquez is a 61 y.o. male with history of HLD, TIAs, and hydrocephalus presenting with R hand numbness and trouble speaking. He did not receive IV t-PA due to symptoms resolved.   TIA  Resultant  Neuro deficits resolved  Code Stroke CT no acute infarct. Aspects 10  MRI  No acute stroke. Old R parietal hmg  MRA  Unremarkable   Carotid Doppler  B ICA 1-39% stenosis  2D Echo  pending   LDL 47  HgbA1c pending  Lovenox 40 mg sq daily  for VTE prophylaxis Diet Heart Room service appropriate? Yes; Fluid consistency: Thin  clopidogrel 75 mg daily prior to admission, now on clopidogrel 75 mg daily. Continue Plavix at discharge unless anticoagulation needed  Therapy recommendations:  No OT, no PT  Disposition:  Return home  ?  atrial fibrillation   ? atrial fibrillation seen on tele  Dr. Pearlean Brownie recommended card consult to resident to clarify  Change plavix to Anticoagulation, atrial fibrillation, confirmed  Hyperlipidemia  Home meds:  lipitor 40, resumed in hospital  LDL 47 goal  Continue statin at discharge  Other Stroke Risk Factors  UDS not done  Other Active Problems  Allergic rhinitis  Hx hydrocephalus s/p Drain removal. Candidate for [facility] if needed. Dr. Pearlean Brownie discussed with him and wife.  Hospital day # 1  Rhoderick Moody [facility] See Amion for Pager information 01/30/2017 4:23 PM  I have personally examined this patient, reviewed notes, independently viewed imaging studies, participated in medical decision making and plan of care.ROS completed by me personally and pertinent positives fully documented  I have made any additions or clarifications directly to the above note. Agree with note above. He  presented with TIA likely from small vessel disease.telemetry shows brief episode of arrythymia recommend cardiology evaluation. He likely needs a outpatient  holter monitor for 30 day to look for PAF.Continue Plavix.Greater than 50% time during this 35 minute visit was spent on counselling and coordination of care about his TIA, discussion about treatment options and answering questions.f/u as outpatient in stroke clinic  Delia HeadyPramod Abbygayle Helfand, MD Medical Director [facility] Pager: [phone] 01/30/2017 9:25 PM  To contact Stroke Continuity provider, please refer to [email or web address]. After hours, contact General Neurology

## 2017-01-30 NOTE — Consult Note (Signed)
ELECTROPHYSIOLOGY CONSULT NOTE    Patient ID: Charles Marquez MRN: [id number], DOB/AGE: [birth year] 61 y.o.  Admit date: 01/29/2017 Date of Consult: 01/30/2017  Primary Physician: Eartha Inch, MD Requesting MD: Laural Benes  Reason for Consultation: TIA and atrial arrhythmias on telemetry  HPI:  Levelle Edelen is a 61 y.o. 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 He presented to the hospital with recurrent TIA symptoms. Telemetry demonstrated sinus rhythm with short run of ectopic atrial tachycardia. EP has been asked to evaluate for consideration of anticoagulation.  Last echo 2015 demonstrated EF 60-65%, grade 1 diastolic dysfunction, LA 38  He currently denies chest pain, shortness of breath, recent fevers, chills, nausea or vomiting. He does not have palpitations, dizziness, syncope.   Past Medical History:  Diagnosis Date  . Hypercholesteremia   . Stroke (HCC)    tia's  . TIA (transient ischemic attack)    09.15     Surgical History:  Past Surgical History:  Procedure Laterality Date  . Fractured arm Left 12  . HERNIA REPAIR Right 3/12  . SHUNT REMOVAL Right 03/13/2016   Procedure: SHUNT REMOVAL;  Surgeon: Julio Sicks, MD;  Location: [facility];  Service: Neurosurgery;  Laterality: Right;  [name] VASECTOMY  10/02/1997  . VENTRICULOPERITONEAL SHUNT Right 12/18/2014   Procedure: Shunt Placment - right occipital VP shunt ;  Surgeon: Temple Pacini, MD;  Location: [facility];  Service: Neurosurgery;  Laterality: Right;  Shunt Placment - right occipital VP shunt      Prescriptions Prior to Admission  Medication Sig Dispense Refill Last Dose  . atorvastatin (LIPITOR) 40 MG tablet Take 1 tablet (40 mg total) by mouth daily. 90 tablet 3 01/29/2017 at Unknown time  . cetirizine (ZYRTEC) 10 MG tablet Take 10 mg by mouth daily as needed for allergies.   01/29/2017 at Unknown  time  . clopidogrel (PLAVIX) 75 MG tablet Take 1 tablet (75 mg total) by mouth daily. 90 tablet 0 01/29/2017 at 0700  . Glucosamine 500 MG CAPS Take 1 capsule by mouth daily.   01/29/2017 at Unknown time  . Multiple Vitamins-Minerals (OCUVITE ADULT 50+ PO) Take 1 capsule by mouth daily.   01/29/2017 at Unknown time    Inpatient Medications:  .  stroke: mapping our early stages of recovery book   Does not apply Once  . atorvastatin  40 mg Oral q1800  . clopidogrel  75 mg Oral Daily  . enoxaparin (LOVENOX) injection  40 mg Subcutaneous Q24H  . loratadine  10 mg Oral Daily    Allergies: No Known Allergies  Social History   Social History  . Marital status: Married    Spouse name: N/A  . Number of children: 2  . Years of education: MASTERS   Occupational History  . Not on file.   Social History Main Topics  . Smoking status: Never Smoker  . Smokeless tobacco: Never Used  . Alcohol use 0.0 oz/week     Comment: ocassionally  . Drug use: No  . Sexual activity: Not on file   Other Topics Concern  . Not on file   Social History Narrative   Patient is married with 2 children.   Patient is right handed.   Patient has a Master's degree.   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  Family History  Problem Relation Age of Onset  . COPD Mother   . Lung cancer Father   . Alzheimer's disease Father      Review of Systems: All other systems reviewed and are otherwise negative except as noted above.  Physical Exam: Vitals:   01/30/17 0500 01/30/17 0653 01/30/17 1007 01/30/17 1358  BP: 107/71 120/79 105/68 106/62  Pulse: (!) 54 (!) 58 84 60  Resp: 16 16 18 18   Temp: 98.4 F (36.9 C) 98.1 F (36.7 C) 98.1 F (36.7 C) 97.9 F (36.6 C)  TempSrc: Oral Oral Oral Oral  SpO2: 97% 98% 97% 99%  Weight:      Height:        GEN- The patient is well appearing, alert and oriented x 3 today.   HEENT:  normocephalic, atraumatic; sclera clear, conjunctiva pink; hearing intact; oropharynx clear; neck supple  Lungs- Clear to ausculation bilaterally, normal work of breathing.  No wheezes, rales, rhonchi Heart- Regular rate and rhythm  GI- soft, non-tender, non-distended, bowel sounds present  Extremities- no clubbing, cyanosis, or edema  MS- no significant deformity or atrophy Skin- warm and dry, no rash or lesion Psych- euthymic mood, full affect Neuro- strength and sensation are intact  Labs:   Lab Results  Component Value Date   WBC 6.2 01/29/2017   HGB 13.9 01/29/2017   HCT 41.0 01/29/2017   MCV 93.9 01/29/2017   PLT 172 01/29/2017    Recent Labs Lab 01/29/17 1233 01/29/17 1243  NA 135 135  K 4.1 4.1  CL 100* 99*  CO2 25  --   BUN 15 17  CREATININE 0.95 0.90  CALCIUM 8.8*  --   PROT 6.7  --   BILITOT 1.1  --   ALKPHOS 37*  --   ALT 25  --   AST 29  --   GLUCOSE 103* 101*      Radiology/Studies: [name] Contrast Result Date: 01/29/2017 CLINICAL DATA:  Initial evaluation for acute aphasia, right hand numbness. EXAM: MRI HEAD WITHOUT CONTRAST MRA HEAD WITHOUT CONTRAST TECHNIQUE: Multiplanar, multiecho pulse sequences of the brain and surrounding structures were obtained without intravenous contrast. Angiographic images of the head were obtained using MRA technique without contrast. COMPARISON:  Prior CT from earlier the same day. FINDINGS: MRI HEAD FINDINGS Brain: Encephalomalacia with chronic hemorrhagic blood products present within the right parietal lobe, consistent with remote hemorrhage seen within this region. Mild chronic microvascular ischemic disease. No evidence for acute infarct. Gray-white matter differentiation maintained. No other evidence for acute or chronic intracranial hemorrhage. No other areas of chronic infarction identified. No mass lesion, midline shift, or mass effect. No hydrocephalus. No extra-axial fluid collection. Major dural sinuses are  grossly patent. Pituitary gland and suprasellar region within normal limits. Vascular: Major intracranial vascular flow voids maintained. Skull and upper cervical spine: Craniocervical junction normal. Visualized upper cervical spine unremarkable. Bone marrow signal intensity within normal limits. No scalp soft tissue abnormality. Sinuses/Orbits: Globes and orbital soft tissues within normal limits. Paranasal sinuses are clear. Right mastoid effusion noted. Inner ear structures normal. Other: No other significant finding. MRA HEAD FINDINGS ANTERIOR CIRCULATION: Distal cervical segments of the internal carotid arteries are widely patent with antegrade flow. Petrous, cavernous, and supraclinoid segments of the internal carotid arteries are widely patent without flow limiting stenosis. A1 segments widely patent. Left A1 segment hypoplastic. Anterior communicating artery normal. Anterior cerebral arteries widely patent to their distal aspects. M1 segments widely patent without stenosis or occlusion.  MCA bifurcations normal. Distal MCA branches well opacified and symmetric. POSTERIOR CIRCULATION: Vertebral arteries widely patent to the vertebrobasilar junction. Right vertebral artery dominant. Posterior inferior cerebral arteries patent bilaterally. Basilar artery widely patent. Superior cerebral arteries patent bilaterally. Right PCA predominantly supplied via the basilar artery and is well opacified to its distal aspect. Left PCA predominantly supplied via a prominent left posterior communicating artery. Left PCA also widely patent to its distal aspect. No aneurysm or vascular malformation. IMPRESSION: MRI HEAD IMPRESSION: 1. No acute intracranial infarct or other process identified. 2. Sequela of remote right parietal hemorrhage. MRA HEAD IMPRESSION: Normal intracranial MRA. Electronically Signed   By: Rise Mu M.D.   On: 01/29/2017 21:03   EKG: sinus rhythm, rate 67 (personally reviewed)   TELEMETRY:  sinus rhythm with short run of ectopic atrial tachycardia (personally reviewed)   Assessment/Plan: 1.  Ectopic atrial tachycardia The patient has documented ectopic atrial tachycardia in the setting of recurrent TIA's.  He has not had sustained atrial arrhythmias on telemetry. At this point, would recommend 30 day event monitor at discharge to further evaluate for atrial fibrillation.  With prior hydrocephalus, would need to be sure that he is a candidate for anticoagulation.  We will arrange outpatient 30 day monitor The patient has follow up scheduled with Dr Pearlean Brownie (primary neurologist)  Electrophysiology team to see as needed while here. Please call with questions.  Signed, Gypsy Balsam, NP 01/30/2017 3:06 PM  I have seen, examined the patient, and reviewed the above assessment and plan.  Changes to above are made where necessary.  On exam, RRR.  Pt with recurrent TIAs.  Echo is pending.  Workup per neurology.  He has had nonsustained atrial arrhythmias on telemetry here.  I would therefore advise 30 day monitor rather than loop recorder placement currently as I think that the likelihood that we will find afib in the next 30 days is high.  If he does not have afib on his 30 day monitor, then I would advise reconsideration of an implantable loop recorder at that time. He will follow-up with Dr Pearlean Brownie in the office.  I will see as needed while here.  Co Sign: Hillis Range, MD 01/30/2017 4:19 PM

## 2017-01-30 NOTE — Progress Notes (Signed)
RN discussed discharge instructions with patient and wife. Verbalize understanding of discharge instructions, f/u appt, f/u with cardiology for event monitor, no changes to medications, understands s/sx of stroke/tia. Neuro assessment unchanged.

## 2017-01-30 NOTE — Progress Notes (Signed)
Subjective: No events overnight. Feeling completely normal this AM. Discussed the plan today.  Review of telemetry was concerning for a potential episode of atrial fibrillation at 13:52 yesterday.  Objective: Vital signs in last 24 hours: Vitals:   01/30/17 0100 01/30/17 0300 01/30/17 0500 01/30/17 0653  BP: 106/77 110/76 107/71 120/79  Pulse: (!) 57 (!) 54 (!) 54 (!) 58  Resp: 16 16 16 16   Temp: 98.4 F (36.9 C) 98.1 F (36.7 C) 98.4 F (36.9 C) 98.1 F (36.7 C)  TempSrc: Oral Oral Oral Oral  SpO2: 96% 96% 97% 98%  Weight:      Height:        Intake/Output Summary (Last 24 hours) at 01/30/17 0959 Last data filed at 01/30/17 0900  Gross per 24 hour  Intake              240 ml  Output                0 ml  Net              240 ml    Physical Exam General appearance: Well-developed elderly gentleman resting comfortably in bed, NAD HENT: Normocephalic, atraumatic Cardiovascular: Regular rate and rhythm, no murmurs, rubs, gallops Respiratory/Chest: Clear to ausculation bilaterally, normal work of breathing Abdomen: Soft, non-tender, non-distended Skin: Warm, dry, intact Neuro: Cranial nerves grossly intact, alert and oriented, strength and sensation intact Psych: Positive affect  Labs / Imaging / Procedures: CBC Latest Ref Rng & Units 01/29/2017 01/29/2017 03/15/2016  WBC 4.0 - 10.5 K/uL - 6.2 7.0  Hemoglobin 13.0 - 17.0 g/dL 57.8 46.9 12.9(L)  Hematocrit 39.0 - 52.0 % 41.0 40.1 38.4(L)  Platelets 150 - 400 K/uL - 172 165   BMP Latest Ref Rng & Units 01/29/2017 01/29/2017 03/14/2016  Glucose 65 - 99 mg/dL 629(B) 284(X) 95  BUN 6 - 20 mg/dL 17 15 14   Creatinine 0.61 - 1.24 mg/dL 3.24 4.01 0.27  Sodium 135 - 145 mmol/L 135 135 137  Potassium 3.5 - 5.1 mmol/L 4.1 4.1 3.5  Chloride 101 - 111 mmol/L 99(L) 100(L) 106  CO2 22 - 32 mmol/L - 25 23  Calcium 8.9 - 10.3 mg/dL - 8.8(L) 8.3(L)   [name] Contrast and Mr Maxine Glenn Headm  IMPRESSION: MRI HEAD IMPRESSION: 1. No acute  intracranial infarct or other process identified. 2. Sequela of remote right parietal hemorrhage. MRA HEAD IMPRESSION: Normal intracranial MRA. Electronically Signed   By: Rise Mu M.D.   On: 01/29/2017 21:03   Assessment/Plan: [name] is a 61 y.o. gentleman with PMH TIA, hydrocephalus, HLD admitted for TIA.[name]   Principal Problem:   TIA (transient ischemic attack)  TIA, transient right hand numbness and expressive aphasia on 3/19 consistent with TIA. History of TIA with similar symptoms in 2009 and 2015 and negative workout at that time. Compliant with Plavix and statin since. Patient has history of idiopathic hydrocephalus in 2015, s/p VP shunt placement and removal after bowel perforation, with mild ICH s/p removal. EEG  with focal slowing in the right hemisphere, no epileptiform activity. MRI/MRA no acute stroke. -- Neurology following, appreciate recs -- Telemetry reviewed and concerning for episode of Afib yesterday afternoon, may need anticoag or prolonged monitor to confirm dx -- Consulted cardiology/EP, meeting with patient to discuss above -- Follow TTE, carotid doplers -- Lipid panel, A1c, wnl -- Continue Plavix 75 mg daily -- PT/OT eval - no f/up needed  Atrial fibrillation, CHADSVASC score 2, newly  diagnosed -- Considering initiation of NOAC vs further cardiac monitoring  HLD, LDL 47 - continue home Lipitor 40 mg daily  Allergic rhinitis - Claritin daily  DVT ppx - Lovenox  Dispo: Anticipated discharge in approximately 0-1 day(s).   LOS: 1 day   Althia FortsAdam Loleta Frommelt, MD 01/30/2017, 9:59 AM Pager: [phone]

## 2017-01-30 NOTE — Progress Notes (Signed)
OT Cancellation Note  Patient Details Name: Farris HasKurt Cheong MRN: [id number] DOB: 12-01-1955   Cancelled Treatment:    Reason Eval/Treat Not Completed: OT screened, no needs identified, will sign off.  Pt reports he is back to baseline.  Maythe Deramo [place], OTR/L [phone]   Jeani HawkingConarpe, Liller Yohn M 01/30/2017, 10:54 AM

## 2017-01-31 ENCOUNTER — Ambulatory Visit (INDEPENDENT_AMBULATORY_CARE_PROVIDER_SITE_OTHER): Payer: BLUE CROSS/BLUE SHIELD

## 2017-01-31 DIAGNOSIS — G459 Transient cerebral ischemic attack, unspecified: Secondary | ICD-10-CM | POA: Diagnosis not present

## 2017-01-31 DIAGNOSIS — I471 Supraventricular tachycardia: Secondary | ICD-10-CM | POA: Diagnosis not present

## 2017-01-31 LAB — HEMOGLOBIN A1C
HEMOGLOBIN A1C: 5.3 % (ref 4.8–5.6)
Mean Plasma Glucose: 105 mg/dL

## 2017-03-02 ENCOUNTER — Telehealth: Payer: Self-pay | Admitting: *Deleted

## 2017-03-02 NOTE — Telephone Encounter (Signed)
Spoke with patient and let him know that he had a short run of afib on 02/28/17 early morning. The strips were received yesterday around noon.  I have discussed with Gypsy Balsam, NP who recommends patient come in to discuss POC.  He is agreeable to come 03/07/17 at 10:45

## 2017-03-07 ENCOUNTER — Encounter: Payer: Self-pay | Admitting: Internal Medicine

## 2017-03-07 ENCOUNTER — Ambulatory Visit (INDEPENDENT_AMBULATORY_CARE_PROVIDER_SITE_OTHER): Payer: BLUE CROSS/BLUE SHIELD | Admitting: Internal Medicine

## 2017-03-07 VITALS — BP 116/72 | HR 56 | Ht 71.0 in | Wt 187.6 lb

## 2017-03-07 DIAGNOSIS — I639 Cerebral infarction, unspecified: Secondary | ICD-10-CM

## 2017-03-07 NOTE — Patient Instructions (Signed)
Medication Instructions:  Your physician recommends that you continue on your current medications as directed. Please refer to the Current Medication list given to you today.   Labwork: None ordered   Testing/Procedures:  LINQ implant on 04/10/17 Check in at [facility] Entrance of [facility] at 6:30am Report to Short Stay  Follow-Up: Your physician recommends that you schedule a follow-up appointment in: 10-14 days from 04/10/17 in device clinic for wound check   Any Other Special Instructions Will Be Listed Below (If Applicable).     If you need a refill on your cardiac medications before your next appointment, please call your pharmacy.

## 2017-03-07 NOTE — Progress Notes (Signed)
PCP: Eartha Inch, MD  Charles Marquez is a 61 y.o. male who presents today for routine electrophysiology followup.  Since his recent stroke, the patient reports doing very well.  He is making good progress. Today, he denies symptoms of palpitations, chest pain, shortness of breath,  lower extremity edema, dizziness, presyncope, or syncope.  The patient is otherwise without complaint today.   Past Medical History:  Diagnosis Date  . Hypercholesteremia   . Stroke (HCC)    tia's  . TIA (transient ischemic attack)    09.15   Past Surgical History:  Procedure Laterality Date  . Fractured arm Left 12  . HERNIA REPAIR Right 3/12  . SHUNT REMOVAL Right 03/13/2016   Procedure: SHUNT REMOVAL;  Surgeon: Julio Sicks, MD;  Location: [facility];  Service: Neurosurgery;  Laterality: Right;  [name] VASECTOMY  10/02/1997  . VENTRICULOPERITONEAL SHUNT Right 12/18/2014   Procedure: Shunt Placment - right occipital VP shunt ;  Surgeon: Temple Pacini, MD;  Location: [facility];  Service: Neurosurgery;  Laterality: Right;  Shunt Placment - right occipital VP shunt     ROS- all systems are reviewed and negatives except as per HPI above  Current Outpatient Prescriptions  Medication Sig Dispense Refill  . atorvastatin (LIPITOR) 40 MG tablet Take 1 tablet (40 mg total) by mouth daily. 90 tablet 3  . cetirizine (ZYRTEC) 10 MG tablet Take 10 mg by mouth daily as needed for allergies.    [name] clopidogrel (PLAVIX) 75 MG tablet Take 1 tablet (75 mg total) by mouth daily. 90 tablet 0  . Glucosamine 500 MG CAPS Take 1 capsule by mouth as directed.     . Multiple Vitamins-Minerals (OCUVITE ADULT 50+ PO) Take 1 capsule by mouth daily.     No current facility-administered medications for this visit.     Physical Exam: Vitals:   03/07/17 1058  BP: 116/72  Pulse: (!) 56  SpO2: 98%  Weight: 187 lb 9.6 oz (85.1 kg)  Height:  (1.803 m)    GEN- The patient is well appearing, alert and oriented x 3 today.   Head-  normocephalic, atraumatic Eyes-  Sclera clear, conjunctiva pink Ears- hearing intact Oropharynx- clear Lungs- Clear to ausculation bilaterally, normal work of breathing Heart- Regular rate and rhythm, no murmurs, rubs or gallops, PMI not laterally displaced GI- soft, NT, ND, + BS Extremities- no clubbing, cyanosis, or edema  30 day monitor (still wearing) is reviewed with the patient. He has nonsustained atrial arrhythmias (<30 seconds) but no sustained arrhythmias  Assessment and Plan:  1. Cryptogenic stroke Nonsustained arrhythmias (likely atach) seen on 30 day monitor (lasting < 30 seconds).  Will continue to follow 30 day monitor.  If no sustained arrhythmias then I would advise ILR implantation.  Risks of the procedure discussed with the patient who wishes to proceed.  If no arrhythmias on 30 day monitor, will proceed with ILR in the next few weeks.  Hillis Range MD, [facility] 03/07/2017 5:55 PM

## 2017-04-10 ENCOUNTER — Ambulatory Visit (HOSPITAL_COMMUNITY)
Admission: RE | Admit: 2017-04-10 | Discharge: 2017-04-10 | Disposition: A | Discharge status: Home / Self Care | Payer: BLUE CROSS/BLUE SHIELD | Source: Ambulatory Visit | Attending: Internal Medicine | Admitting: Internal Medicine

## 2017-04-10 ENCOUNTER — Encounter (HOSPITAL_COMMUNITY)
Admission: RE | Disposition: A | Discharge status: Home / Self Care | Payer: Self-pay | Source: Ambulatory Visit | Attending: Internal Medicine

## 2017-04-10 ENCOUNTER — Encounter (HOSPITAL_COMMUNITY): Payer: Self-pay | Admitting: Internal Medicine

## 2017-04-10 DIAGNOSIS — E7800 Pure hypercholesterolemia, unspecified: Secondary | ICD-10-CM | POA: Diagnosis not present

## 2017-04-10 DIAGNOSIS — I639 Cerebral infarction, unspecified: Secondary | ICD-10-CM | POA: Diagnosis present

## 2017-04-10 DIAGNOSIS — Z982 Presence of cerebrospinal fluid drainage device: Secondary | ICD-10-CM | POA: Insufficient documentation

## 2017-04-10 DIAGNOSIS — Z8673 Personal history of transient ischemic attack (TIA), and cerebral infarction without residual deficits: Secondary | ICD-10-CM | POA: Insufficient documentation

## 2017-04-10 DIAGNOSIS — Z79899 Other long term (current) drug therapy: Secondary | ICD-10-CM | POA: Diagnosis not present

## 2017-04-10 HISTORY — PX: LOOP RECORDER INSERTION: EP1214

## 2017-04-10 SURGERY — LOOP RECORDER INSERTION

## 2017-04-10 MED ORDER — LIDOCAINE-EPINEPHRINE 1 %-1:100000 IJ SOLN
INTRAMUSCULAR | Status: DC | PRN
  Administered 2017-04-10: 10 mL

## 2017-04-10 MED ORDER — LIDOCAINE-EPINEPHRINE 1 %-1:100000 IJ SOLN
INTRAMUSCULAR | Status: AC
  Filled 2017-04-10: qty 1

## 2017-04-10 SURGICAL SUPPLY — 2 items
LOOP REVEAL LINQSYS (Prosthesis & Implant Heart) ×3 IMPLANT
PACK LOOP INSERTION (CUSTOM PROCEDURE TRAY) ×3 IMPLANT

## 2017-04-10 NOTE — H&P (Signed)
  PCP: Eartha InchBADGER,MICHAEL C, MD  Charles Marquez is a 61 y.o. male who presents today for ILR placement.  Since his recent stroke, the patient reports doing very well.  He is making good progress. Today, he denies symptoms of palpitations, chest pain, shortness of breath,  lower extremity edema, dizziness, presyncope, or syncope.  The patient is otherwise without complaint today.       Past Medical History:  Diagnosis Date  . Hypercholesteremia   . Stroke (HCC)    tia's  . TIA (transient ischemic attack)    09.15        Past Surgical History:  Procedure Laterality Date  . Fractured arm Left 12  . HERNIA REPAIR Right 3/12  . SHUNT REMOVAL Right 03/13/2016   Procedure: SHUNT REMOVAL;  Surgeon: Julio SicksHenry Pool, MD;  Location: [facility];  Service: Neurosurgery;  Laterality: Right;  [name]. VASECTOMY  10/02/1997  . VENTRICULOPERITONEAL SHUNT Right 12/18/2014   Procedure: Shunt Placment - right occipital VP shunt ;  Surgeon: Temple PaciniHenry A Pool, MD;  Location: [facility];  Service: Neurosurgery;  Laterality: Right;  Shunt Placment - right occipital VP shunt     ROS- all systems are reviewed and negatives except as per HPI above        Current Outpatient Prescriptions  Medication Sig Dispense Refill  . atorvastatin (LIPITOR) 40 MG tablet Take 1 tablet (40 mg total) by mouth daily. 90 tablet 3  . cetirizine (ZYRTEC) 10 MG tablet Take 10 mg by mouth daily as needed for allergies.    [name]. clopidogrel (PLAVIX) 75 MG tablet Take 1 tablet (75 mg total) by mouth daily. 90 tablet 0  . Glucosamine 500 MG CAPS Take 1 capsule by mouth as directed.     . Multiple Vitamins-Minerals (OCUVITE ADULT 50+ PO) Take 1 capsule by mouth daily.     No current facility-administered medications for this visit.     Physical Exam: Vitals:   04/10/17 0624  BP: 129/80  Pulse: 70  Temp: 97.8 F (36.6 C)   GEN- The patient is well appearing, alert and oriented x 3 today.   Head- normocephalic, atraumatic Eyes-   Sclera clear, conjunctiva pink Ears- hearing intact Oropharynx- clear Lungs- Clear to ausculation bilaterally, normal work of breathing Heart- Regular rate and rhythm, no murmurs, rubs or gallops, PMI not laterally displaced GI- soft, NT, ND, + BS Extremities- no clubbing, cyanosis, or edema    Assessment and Plan:  1. Cryptogenic stroke Pt with prior cryptogenic stroke.  30 day monitor revealed atach (nonsustained) but no afib.  I would therefore advised additional monitoring going forward.  Risks and benefits of ILR placement to evaluate for afib were discussed at length with the patient who wishes to proceed.  Hillis RangeJames Nilesh Stegall MD, [facility] 04/10/2017 7:47 AM

## 2017-04-10 NOTE — Discharge Instructions (Signed)
Sterile Tape Wound Care °Some cuts and wounds can be closed using sterile tape, also called skin adhesive strips. Skin adhesive strips can be used for shallow (superficial) and simple cuts, wounds, lacerations, and some surgical incisions. These strips act in place of stitches, or in addition to stitches, to hold the edges of the wound together to allow for better healing. Unlike stitches, the adhesive strips do not require needles or anesthetic medicine for placement. The strips usually fall off on their own as the wound is healing. It is important to take proper care of your wound at home while it heals. °How to care for a sterile tape wound °· Try to keep the area around your wound clean and dry. Do not allow the adhesive strips to get wet for the first 24 hours. °· Do not use any soaps or ointments on the wound for the first 24 hours. °· If a bandage (dressing) has been applied, keep it dry. °· Follow instructions from your health care provider about how often to change the dressing. °¨ Wash your hands with soap and water before you change your dressing. If soap and water are not available, use hand sanitizer. °¨ Change your dressing as told by your health care provider. °¨ Leave adhesive strips in place. These skin closures may need to stay in place for 2 weeks or longer. If adhesive strip edges start to loosen and curl up, you may trim the loose edges. Do not remove adhesive strips completely unless your health care provider tells you to do that. °· Do not scratch, rub, or pick at the wound area. °· Protect the wound from further injury until it is healed. °· Protect the wound from sun and tanning bed exposure while it is healing, and for several weeks after healing. °· Check the wound every day for signs of infection. Check for: °¨ More redness, swelling, or pain. °¨ More fluid or blood. °¨ Warmth. °¨ Pus or a bad smell. °Follow these instructions at home: °· Take over-the-counter and prescription medicines  only as told by your health care provider. °· Keep all follow-up visits as told by your health care provider. This is important. °Contact a health care provider if: °· Your adhesive strips become soaked with blood or fall off before the wound has healed. The tape will need to be replaced. °· You have a fever. °Get help right away if: °· You have chills. °· You develop a rash after the strips are applied. °· You have a red streak that goes away from the wound. °· You have more redness, swelling, or pain around your wound. °· You have more fluid or blood coming from your wound. °· Your wound feels warm to the touch. °· You have pus or a bad smell coming from your wound. °· Your wound breaks open. °This information is not intended to replace advice given to you by your health care provider. Make sure you discuss any questions you have with your health care provider. °Document Released: 12/07/2004 Document Revised: 09/22/2016 Document Reviewed: 09/22/2016 °Elsevier Interactive Patient Education © 2017 [employer] ° °

## 2017-04-25 ENCOUNTER — Ambulatory Visit (INDEPENDENT_AMBULATORY_CARE_PROVIDER_SITE_OTHER): Payer: BLUE CROSS/BLUE SHIELD | Admitting: *Deleted

## 2017-04-25 DIAGNOSIS — I639 Cerebral infarction, unspecified: Secondary | ICD-10-CM

## 2017-04-25 DIAGNOSIS — Z4509 Encounter for adjustment and management of other cardiac device: Secondary | ICD-10-CM

## 2017-04-25 LAB — CUP PACEART INCLINIC DEVICE CHECK
MDC IDC PG IMPLANT DT: 20180529
MDC IDC SESS DTM: 20180613094004

## 2017-04-25 NOTE — Progress Notes (Signed)
LINQ wound check in clinic. Steri-strips previously removed, wound edges approximated and well-healed. No redness, swelling or drainage noted. Battery status- good. R waves 0.5746mV. No episodes. Patient educated about wound care and Carelink monitoring. Monthly Summary Reports and ROV with JA PRN.

## 2017-05-10 ENCOUNTER — Ambulatory Visit (INDEPENDENT_AMBULATORY_CARE_PROVIDER_SITE_OTHER): Payer: BLUE CROSS/BLUE SHIELD | Admitting: *Deleted

## 2017-05-10 DIAGNOSIS — I639 Cerebral infarction, unspecified: Secondary | ICD-10-CM

## 2017-05-11 NOTE — Progress Notes (Signed)
Carelink Summary Report / Loop Recorder 

## 2017-05-21 LAB — CUP PACEART REMOTE DEVICE CHECK
Date Time Interrogation Session: 20180628154120
MDC IDC PG IMPLANT DT: 20180529

## 2017-06-07 ENCOUNTER — Encounter: Payer: Self-pay | Admitting: Internal Medicine

## 2017-06-11 ENCOUNTER — Ambulatory Visit (INDEPENDENT_AMBULATORY_CARE_PROVIDER_SITE_OTHER): Payer: BLUE CROSS/BLUE SHIELD | Admitting: *Deleted

## 2017-06-11 DIAGNOSIS — I639 Cerebral infarction, unspecified: Secondary | ICD-10-CM | POA: Diagnosis not present

## 2017-06-11 NOTE — Progress Notes (Signed)
Carelink Summary Report / Loop Recorder 

## 2017-06-23 LAB — CUP PACEART REMOTE DEVICE CHECK
Date Time Interrogation Session: 20180728160950
MDC IDC PG IMPLANT DT: 20180529

## 2017-07-09 ENCOUNTER — Ambulatory Visit (INDEPENDENT_AMBULATORY_CARE_PROVIDER_SITE_OTHER): Payer: BLUE CROSS/BLUE SHIELD | Admitting: *Deleted

## 2017-07-09 DIAGNOSIS — I639 Cerebral infarction, unspecified: Secondary | ICD-10-CM | POA: Diagnosis not present

## 2017-07-09 NOTE — Progress Notes (Signed)
Carelink Summary Report / Loop Recorder 

## 2017-07-12 LAB — CUP PACEART REMOTE DEVICE CHECK
Date Time Interrogation Session: 20180827161149
Implantable Pulse Generator Implant Date: 20180529

## 2017-08-08 ENCOUNTER — Ambulatory Visit (INDEPENDENT_AMBULATORY_CARE_PROVIDER_SITE_OTHER): Payer: BLUE CROSS/BLUE SHIELD | Admitting: *Deleted

## 2017-08-08 DIAGNOSIS — I639 Cerebral infarction, unspecified: Secondary | ICD-10-CM | POA: Diagnosis not present

## 2017-08-08 NOTE — Progress Notes (Signed)
Carelink Summary Report / Loop Recorder 

## 2017-08-10 LAB — CUP PACEART REMOTE DEVICE CHECK
Date Time Interrogation Session: 20180926164253
Implantable Pulse Generator Implant Date: 20180529

## 2017-09-07 ENCOUNTER — Ambulatory Visit (INDEPENDENT_AMBULATORY_CARE_PROVIDER_SITE_OTHER): Payer: BLUE CROSS/BLUE SHIELD | Admitting: *Deleted

## 2017-09-07 ENCOUNTER — Encounter: Payer: Self-pay | Admitting: Internal Medicine

## 2017-09-07 DIAGNOSIS — I639 Cerebral infarction, unspecified: Secondary | ICD-10-CM

## 2017-09-07 NOTE — Progress Notes (Signed)
Carelink Summary Report / Loop Recorder 

## 2017-09-10 NOTE — Telephone Encounter (Signed)
Patient returned my call. Informed patient that I sent him an email with the contact information to MyChart tech support. Patient verbalized understanding and appreciation of information.

## 2017-09-10 NOTE — Telephone Encounter (Signed)
[id number]//[id number] 

## 2017-09-13 LAB — CUP PACEART REMOTE DEVICE CHECK
Date Time Interrogation Session: 20181026163921
MDC IDC PG IMPLANT DT: 20180529

## 2017-10-08 ENCOUNTER — Ambulatory Visit (INDEPENDENT_AMBULATORY_CARE_PROVIDER_SITE_OTHER): Payer: BLUE CROSS/BLUE SHIELD | Admitting: *Deleted

## 2017-10-08 DIAGNOSIS — I639 Cerebral infarction, unspecified: Secondary | ICD-10-CM

## 2017-10-09 NOTE — Progress Notes (Signed)
Carelink Summary Report / Loop Recorder 

## 2017-10-22 LAB — CUP PACEART REMOTE DEVICE CHECK
Date Time Interrogation Session: 20181125184246
Implantable Pulse Generator Implant Date: 20180529

## 2017-11-07 ENCOUNTER — Ambulatory Visit (INDEPENDENT_AMBULATORY_CARE_PROVIDER_SITE_OTHER): Payer: BLUE CROSS/BLUE SHIELD | Admitting: *Deleted

## 2017-11-07 DIAGNOSIS — I639 Cerebral infarction, unspecified: Secondary | ICD-10-CM

## 2017-11-08 NOTE — Progress Notes (Signed)
Carelink Summary Report / Loop Recorder 

## 2017-11-19 LAB — CUP PACEART REMOTE DEVICE CHECK
Date Time Interrogation Session: 20181225190953
MDC IDC PG IMPLANT DT: 20180529

## 2017-12-06 ENCOUNTER — Ambulatory Visit (INDEPENDENT_AMBULATORY_CARE_PROVIDER_SITE_OTHER): Payer: BLUE CROSS/BLUE SHIELD | Admitting: *Deleted

## 2017-12-06 DIAGNOSIS — I639 Cerebral infarction, unspecified: Secondary | ICD-10-CM | POA: Diagnosis not present

## 2017-12-07 NOTE — Progress Notes (Signed)
Carelink Summary Report / Loop Recorder 

## 2017-12-14 LAB — CUP PACEART REMOTE DEVICE CHECK
Implantable Pulse Generator Implant Date: 20180529
MDC IDC SESS DTM: 20190124194127

## 2018-01-08 ENCOUNTER — Ambulatory Visit (INDEPENDENT_AMBULATORY_CARE_PROVIDER_SITE_OTHER): Payer: BLUE CROSS/BLUE SHIELD | Admitting: *Deleted

## 2018-01-08 DIAGNOSIS — I639 Cerebral infarction, unspecified: Secondary | ICD-10-CM | POA: Diagnosis not present

## 2018-01-09 NOTE — Progress Notes (Signed)
Carelink Summary Report / Loop Recorder 

## 2018-02-11 ENCOUNTER — Ambulatory Visit (INDEPENDENT_AMBULATORY_CARE_PROVIDER_SITE_OTHER): Payer: BLUE CROSS/BLUE SHIELD | Admitting: *Deleted

## 2018-02-11 DIAGNOSIS — I639 Cerebral infarction, unspecified: Secondary | ICD-10-CM

## 2018-02-11 NOTE — Progress Notes (Signed)
Carelink Summary Report / Loop Recorder 

## 2018-02-13 LAB — CUP PACEART REMOTE DEVICE CHECK
Implantable Pulse Generator Implant Date: 20180529
MDC IDC SESS DTM: 20190226224603

## 2018-03-15 ENCOUNTER — Ambulatory Visit (INDEPENDENT_AMBULATORY_CARE_PROVIDER_SITE_OTHER): Payer: BLUE CROSS/BLUE SHIELD | Admitting: *Deleted

## 2018-03-15 DIAGNOSIS — I639 Cerebral infarction, unspecified: Secondary | ICD-10-CM

## 2018-03-18 LAB — CUP PACEART REMOTE DEVICE CHECK
Implantable Pulse Generator Implant Date: 20180529
MDC IDC SESS DTM: 20190331233816

## 2018-03-18 NOTE — Progress Notes (Signed)
Carelink Summary Report / Loop Recorder 

## 2018-04-09 LAB — CUP PACEART REMOTE DEVICE CHECK
Implantable Pulse Generator Implant Date: 20180529
MDC IDC SESS DTM: 20190503233947

## 2018-04-17 ENCOUNTER — Ambulatory Visit (INDEPENDENT_AMBULATORY_CARE_PROVIDER_SITE_OTHER): Payer: BLUE CROSS/BLUE SHIELD | Admitting: *Deleted

## 2018-04-17 DIAGNOSIS — I639 Cerebral infarction, unspecified: Secondary | ICD-10-CM | POA: Diagnosis not present

## 2018-04-18 NOTE — Progress Notes (Signed)
Carelink Summary Report / Loop Recorder 

## 2018-05-20 ENCOUNTER — Ambulatory Visit (INDEPENDENT_AMBULATORY_CARE_PROVIDER_SITE_OTHER): Payer: BLUE CROSS/BLUE SHIELD | Admitting: *Deleted

## 2018-05-20 DIAGNOSIS — I639 Cerebral infarction, unspecified: Secondary | ICD-10-CM | POA: Diagnosis not present

## 2018-05-21 NOTE — Progress Notes (Signed)
Carelink Summary Report / Loop Recorder 

## 2018-05-24 LAB — CUP PACEART REMOTE DEVICE CHECK
Date Time Interrogation Session: 20190606000958
MDC IDC PG IMPLANT DT: 20180529

## 2018-06-22 LAB — CUP PACEART REMOTE DEVICE CHECK
Implantable Pulse Generator Implant Date: 20180529
MDC IDC SESS DTM: 20190709003944

## 2018-06-24 ENCOUNTER — Ambulatory Visit (INDEPENDENT_AMBULATORY_CARE_PROVIDER_SITE_OTHER): Payer: BLUE CROSS/BLUE SHIELD | Admitting: *Deleted

## 2018-06-24 DIAGNOSIS — I639 Cerebral infarction, unspecified: Secondary | ICD-10-CM | POA: Diagnosis not present

## 2018-06-24 NOTE — Progress Notes (Signed)
Carelink Summary Report / Loop Recorder 

## 2018-07-19 ENCOUNTER — Other Ambulatory Visit: Payer: Self-pay | Admitting: Neurosurgery

## 2018-07-22 ENCOUNTER — Inpatient Hospital Stay (HOSPITAL_COMMUNITY): Payer: BLUE CROSS/BLUE SHIELD | Admitting: Anesthesiology

## 2018-07-22 ENCOUNTER — Inpatient Hospital Stay (HOSPITAL_COMMUNITY)
Admission: RE | Admit: 2018-07-22 | Discharge: 2018-07-23 | DRG: 033 | Disposition: A | Discharge status: Home / Self Care | Payer: BLUE CROSS/BLUE SHIELD | Attending: Neurosurgery | Admitting: Neurosurgery

## 2018-07-22 ENCOUNTER — Encounter (HOSPITAL_COMMUNITY)
Admission: RE | Disposition: A | Discharge status: Home / Self Care | Payer: Self-pay | Source: Home / Self Care | Attending: Neurosurgery

## 2018-07-22 ENCOUNTER — Encounter (HOSPITAL_COMMUNITY): Payer: Self-pay

## 2018-07-22 ENCOUNTER — Other Ambulatory Visit: Payer: Self-pay

## 2018-07-22 ENCOUNTER — Telehealth: Payer: Self-pay | Admitting: *Deleted

## 2018-07-22 DIAGNOSIS — Z79899 Other long term (current) drug therapy: Secondary | ICD-10-CM | POA: Diagnosis not present

## 2018-07-22 DIAGNOSIS — E7800 Pure hypercholesterolemia, unspecified: Secondary | ICD-10-CM | POA: Diagnosis present

## 2018-07-22 DIAGNOSIS — Z8673 Personal history of transient ischemic attack (TIA), and cerebral infarction without residual deficits: Secondary | ICD-10-CM

## 2018-07-22 DIAGNOSIS — R51 Headache: Secondary | ICD-10-CM | POA: Diagnosis present

## 2018-07-22 DIAGNOSIS — F419 Anxiety disorder, unspecified: Secondary | ICD-10-CM | POA: Diagnosis present

## 2018-07-22 DIAGNOSIS — G4733 Obstructive sleep apnea (adult) (pediatric): Secondary | ICD-10-CM | POA: Diagnosis present

## 2018-07-22 DIAGNOSIS — Z7902 Long term (current) use of antithrombotics/antiplatelets: Secondary | ICD-10-CM

## 2018-07-22 DIAGNOSIS — G910 Communicating hydrocephalus: Principal | ICD-10-CM | POA: Diagnosis present

## 2018-07-22 HISTORY — PX: VENTRICULOPERITONEAL SHUNT: SHX204

## 2018-07-22 HISTORY — DX: Anxiety disorder, unspecified: F41.9

## 2018-07-22 LAB — CBC WITH DIFFERENTIAL/PLATELET
ABS IMMATURE GRANULOCYTES: 0 10*3/uL (ref 0.0–0.1)
Basophils Absolute: 0 10*3/uL (ref 0.0–0.1)
Basophils Relative: 0 %
EOS PCT: 0 %
Eosinophils Absolute: 0 10*3/uL (ref 0.0–0.7)
HEMATOCRIT: 42 % (ref 39.0–52.0)
HEMOGLOBIN: 14 g/dL (ref 13.0–17.0)
Immature Granulocytes: 0 %
LYMPHS ABS: 0.9 10*3/uL (ref 0.7–4.0)
LYMPHS PCT: 13 %
MCH: 32.4 pg (ref 26.0–34.0)
MCHC: 33.3 g/dL (ref 30.0–36.0)
MCV: 97.2 fL (ref 78.0–100.0)
MONO ABS: 0.6 10*3/uL (ref 0.1–1.0)
Monocytes Relative: 8 %
NEUTROS ABS: 5.5 10*3/uL (ref 1.7–7.7)
Neutrophils Relative %: 79 %
Platelets: 160 10*3/uL (ref 150–400)
RBC: 4.32 MIL/uL (ref 4.22–5.81)
RDW: 12.3 % (ref 11.5–15.5)
WBC: 7 10*3/uL (ref 4.0–10.5)

## 2018-07-22 LAB — BASIC METABOLIC PANEL
ANION GAP: 10 (ref 5–15)
BUN: 12 mg/dL (ref 6–20)
CHLORIDE: 103 mmol/L (ref 98–111)
CO2: 27 mmol/L (ref 22–32)
Calcium: 9.1 mg/dL (ref 8.9–10.3)
Creatinine, Ser: 0.9 mg/dL (ref 0.61–1.24)
GFR calc Af Amer: >60 mL/min (ref >60)
GFR calc non Af Amer: >60 mL/min (ref >60)
GLUCOSE: 109 mg/dL — AB (ref 70–99)
Potassium: 3.7 mmol/L (ref 3.5–5.1)
Sodium: 140 mmol/L (ref 135–145)

## 2018-07-22 SURGERY — SHUNT INSERTION VENTRICULAR-PERITONEAL
Anesthesia: General | Laterality: Right

## 2018-07-22 MED ORDER — ONDANSETRON HCL 4 MG/2ML IJ SOLN: 4.0000 mg | Freq: Four times a day (QID) | INTRAMUSCULAR | Status: DC | PRN

## 2018-07-22 MED ORDER — FENTANYL CITRATE (PF) 100 MCG/2ML IJ SOLN
25.0000 ug | INTRAMUSCULAR | Status: DC | PRN
  Administered 2018-07-22: 50 ug via INTRAVENOUS

## 2018-07-22 MED ORDER — LORATADINE 10 MG PO TABS
10.0000 mg | ORAL_TABLET | Freq: Every day | ORAL | Status: DC
  Administered 2018-07-23: 10 mg via ORAL
  Filled 2018-07-22: qty 1

## 2018-07-22 MED ORDER — ROCURONIUM BROMIDE 10 MG/ML (PF) SYRINGE
PREFILLED_SYRINGE | INTRAVENOUS | Status: DC | PRN
  Administered 2018-07-22: 10 mg via INTRAVENOUS
  Administered 2018-07-22: 50 mg via INTRAVENOUS

## 2018-07-22 MED ORDER — FENTANYL CITRATE (PF) 100 MCG/2ML IJ SOLN
INTRAMUSCULAR | Status: DC | PRN
  Administered 2018-07-22 (×2): 100 ug via INTRAVENOUS

## 2018-07-22 MED ORDER — ONDANSETRON HCL 4 MG/2ML IJ SOLN
INTRAMUSCULAR | Status: DC | PRN
  Administered 2018-07-22: 4 mg via INTRAVENOUS

## 2018-07-22 MED ORDER — EPHEDRINE 5 MG/ML INJ
INTRAVENOUS | Status: AC
  Filled 2018-07-22: qty 10

## 2018-07-22 MED ORDER — PROPOFOL 10 MG/ML IV BOLUS
INTRAVENOUS | Status: DC | PRN
  Administered 2018-07-22: 180 mg via INTRAVENOUS

## 2018-07-22 MED ORDER — PROSIGHT PO TABS
1.0000 | ORAL_TABLET | Freq: Every day | ORAL | Status: DC
  Administered 2018-07-23: 1 via ORAL
  Filled 2018-07-22: qty 1

## 2018-07-22 MED ORDER — CHLORHEXIDINE GLUCONATE CLOTH 2 % EX PADS: 6.0000 | MEDICATED_PAD | Freq: Once | CUTANEOUS | Status: DC

## 2018-07-22 MED ORDER — HYDROMORPHONE HCL 1 MG/ML IJ SOLN
1.0000 mg | INTRAMUSCULAR | Status: DC | PRN
  Administered 2018-07-22: 1 mg via INTRAVENOUS
  Filled 2018-07-22: qty 1

## 2018-07-22 MED ORDER — FENTANYL CITRATE (PF) 100 MCG/2ML IJ SOLN
INTRAMUSCULAR | Status: AC
  Filled 2018-07-22: qty 2

## 2018-07-22 MED ORDER — LIDOCAINE-EPINEPHRINE 1 %-1:100000 IJ SOLN
INTRAMUSCULAR | Status: AC
  Filled 2018-07-22: qty 1

## 2018-07-22 MED ORDER — EPHEDRINE SULFATE-NACL 50-0.9 MG/10ML-% IV SOSY
PREFILLED_SYRINGE | INTRAVENOUS | Status: DC | PRN
  Administered 2018-07-22: 10 mg via INTRAVENOUS

## 2018-07-22 MED ORDER — ROCURONIUM BROMIDE 50 MG/5ML IV SOSY
PREFILLED_SYRINGE | INTRAVENOUS | Status: AC
  Filled 2018-07-22: qty 5

## 2018-07-22 MED ORDER — MENTHOL 3 MG MT LOZG: 1.0000 | LOZENGE | OROMUCOSAL | Status: DC | PRN

## 2018-07-22 MED ORDER — ONDANSETRON HCL 4 MG PO TABS: 4.0000 mg | ORAL_TABLET | Freq: Four times a day (QID) | ORAL | Status: DC | PRN

## 2018-07-22 MED ORDER — ATORVASTATIN CALCIUM 20 MG PO TABS
40.0000 mg | ORAL_TABLET | Freq: Every day | ORAL | Status: DC
  Administered 2018-07-22: 40 mg via ORAL
  Filled 2018-07-22: qty 2

## 2018-07-22 MED ORDER — CEFAZOLIN SODIUM-DEXTROSE 1-4 GM/50ML-% IV SOLN
1.0000 g | Freq: Three times a day (TID) | INTRAVENOUS | Status: AC
  Administered 2018-07-22 – 2018-07-23 (×2): 1 g via INTRAVENOUS
  Filled 2018-07-22 (×2): qty 50

## 2018-07-22 MED ORDER — SUGAMMADEX SODIUM 200 MG/2ML IV SOLN
INTRAVENOUS | Status: DC | PRN
  Administered 2018-07-22: 200 mg via INTRAVENOUS

## 2018-07-22 MED ORDER — NAPROXEN 250 MG PO TABS: 250.0000 mg | ORAL_TABLET | Freq: Two times a day (BID) | ORAL | Status: DC | PRN

## 2018-07-22 MED ORDER — THROMBIN 5000 UNITS EX SOLR
CUTANEOUS | Status: AC
  Filled 2018-07-22: qty 10000

## 2018-07-22 MED ORDER — SODIUM CHLORIDE 0.9 % IV SOLN
INTRAVENOUS | Status: DC | PRN
  Administered 2018-07-22: 15:00:00

## 2018-07-22 MED ORDER — LACTATED RINGERS IV SOLN
INTRAVENOUS | Status: DC
  Administered 2018-07-22: 13:00:00 via INTRAVENOUS

## 2018-07-22 MED ORDER — ACETAMINOPHEN 325 MG PO TABS: 650.0000 mg | ORAL_TABLET | ORAL | Status: DC | PRN

## 2018-07-22 MED ORDER — THROMBIN 5000 UNITS EX SOLR
CUTANEOUS | Status: DC | PRN
  Administered 2018-07-22: 10000 [IU] via TOPICAL

## 2018-07-22 MED ORDER — ACETAMINOPHEN 650 MG RE SUPP: 650.0000 mg | RECTAL | Status: DC | PRN

## 2018-07-22 MED ORDER — BACITRACIN ZINC 500 UNIT/GM EX OINT
TOPICAL_OINTMENT | CUTANEOUS | Status: DC | PRN
  Administered 2018-07-22: 1 via TOPICAL

## 2018-07-22 MED ORDER — CEFAZOLIN SODIUM-DEXTROSE 2-4 GM/100ML-% IV SOLN
INTRAVENOUS | Status: AC
  Filled 2018-07-22: qty 100

## 2018-07-22 MED ORDER — ONDANSETRON HCL 4 MG/2ML IJ SOLN
INTRAMUSCULAR | Status: AC
  Filled 2018-07-22: qty 2

## 2018-07-22 MED ORDER — MIDAZOLAM HCL 2 MG/2ML IJ SOLN
INTRAMUSCULAR | Status: AC
  Filled 2018-07-22: qty 2

## 2018-07-22 MED ORDER — SODIUM CHLORIDE 0.9% FLUSH: 3.0000 mL | INTRAVENOUS | Status: DC | PRN

## 2018-07-22 MED ORDER — DEXAMETHASONE SODIUM PHOSPHATE 10 MG/ML IJ SOLN
10.0000 mg | INTRAMUSCULAR | Status: AC
  Administered 2018-07-22: 10 mg via INTRAVENOUS
  Filled 2018-07-22: qty 1

## 2018-07-22 MED ORDER — OXYCODONE HCL 5 MG PO TABS: 5.0000 mg | ORAL_TABLET | Freq: Once | ORAL | Status: DC | PRN

## 2018-07-22 MED ORDER — SODIUM CHLORIDE 0.9 % IV SOLN: 250.0000 mL | INTRAVENOUS | Status: DC

## 2018-07-22 MED ORDER — SALINE SPRAY 0.65 % NA SOLN: 1.0000 | NASAL | Status: DC | PRN

## 2018-07-22 MED ORDER — CEFAZOLIN SODIUM-DEXTROSE 2-4 GM/100ML-% IV SOLN
2.0000 g | INTRAVENOUS | Status: AC
  Administered 2018-07-22: 2 g via INTRAVENOUS

## 2018-07-22 MED ORDER — LIDOCAINE 2% (20 MG/ML) 5 ML SYRINGE
INTRAMUSCULAR | Status: DC | PRN
  Administered 2018-07-22: 80 mg via INTRAVENOUS

## 2018-07-22 MED ORDER — HYDROCODONE-ACETAMINOPHEN 10-325 MG PO TABS
2.0000 | ORAL_TABLET | ORAL | Status: DC | PRN
  Administered 2018-07-22: 2 via ORAL
  Filled 2018-07-22 (×2): qty 2

## 2018-07-22 MED ORDER — MIDAZOLAM HCL 5 MG/5ML IJ SOLN
INTRAMUSCULAR | Status: DC | PRN
  Administered 2018-07-22: 2 mg via INTRAVENOUS

## 2018-07-22 MED ORDER — LIDOCAINE 2% (20 MG/ML) 5 ML SYRINGE
INTRAMUSCULAR | Status: AC
  Filled 2018-07-22: qty 5

## 2018-07-22 MED ORDER — HYDROCODONE-ACETAMINOPHEN 5-325 MG PO TABS
1.0000 | ORAL_TABLET | ORAL | Status: DC | PRN
  Administered 2018-07-23 (×3): 1 via ORAL
  Filled 2018-07-22 (×3): qty 1

## 2018-07-22 MED ORDER — PROPOFOL 10 MG/ML IV BOLUS
INTRAVENOUS | Status: AC
  Filled 2018-07-22: qty 20

## 2018-07-22 MED ORDER — PHENOL 1.4 % MT LIQD: 1.0000 | OROMUCOSAL | Status: DC | PRN

## 2018-07-22 MED ORDER — PSEUDOEPHEDRINE-NAPROXEN NA ER 120-220 MG PO TB12: ORAL_TABLET | ORAL | Status: DC | PRN

## 2018-07-22 MED ORDER — 0.9 % SODIUM CHLORIDE (POUR BTL) OPTIME
TOPICAL | Status: DC | PRN
  Administered 2018-07-22: 1000 mL

## 2018-07-22 MED ORDER — PSEUDOEPHEDRINE HCL ER 120 MG PO TB12
120.0000 mg | ORAL_TABLET | Freq: Two times a day (BID) | ORAL | Status: DC | PRN
  Filled 2018-07-22: qty 1

## 2018-07-22 MED ORDER — OXYCODONE HCL 5 MG/5ML PO SOLN: 5.0000 mg | Freq: Once | ORAL | Status: DC | PRN

## 2018-07-22 MED ORDER — FENTANYL CITRATE (PF) 250 MCG/5ML IJ SOLN
INTRAMUSCULAR | Status: AC
  Filled 2018-07-22: qty 5

## 2018-07-22 MED ORDER — ONDANSETRON HCL 4 MG/2ML IJ SOLN: 4.0000 mg | Freq: Once | INTRAMUSCULAR | Status: DC | PRN

## 2018-07-22 MED ORDER — HEMOSTATIC AGENTS (NO CHARGE) OPTIME
TOPICAL | Status: DC | PRN
  Administered 2018-07-22: 1 via TOPICAL

## 2018-07-22 MED ORDER — BACITRACIN ZINC 500 UNIT/GM EX OINT
TOPICAL_OINTMENT | CUTANEOUS | Status: AC
  Filled 2018-07-22: qty 28.35

## 2018-07-22 MED ORDER — KETOROLAC TROMETHAMINE 15 MG/ML IJ SOLN
15.0000 mg | Freq: Four times a day (QID) | INTRAMUSCULAR | Status: AC
  Administered 2018-07-22 – 2018-07-23 (×4): 15 mg via INTRAVENOUS
  Filled 2018-07-22 (×4): qty 1

## 2018-07-22 MED ORDER — SODIUM CHLORIDE 0.9% FLUSH: 3.0000 mL | Freq: Two times a day (BID) | INTRAVENOUS | Status: DC

## 2018-07-22 MED ORDER — ALPRAZOLAM 0.5 MG PO TABS: 0.5000 mg | ORAL_TABLET | Freq: Three times a day (TID) | ORAL | Status: DC | PRN

## 2018-07-22 MED ORDER — CYCLOBENZAPRINE HCL 10 MG PO TABS
10.0000 mg | ORAL_TABLET | Freq: Three times a day (TID) | ORAL | Status: DC | PRN
  Administered 2018-07-22 – 2018-07-23 (×2): 10 mg via ORAL
  Filled 2018-07-22 (×2): qty 1

## 2018-07-22 SURGICAL SUPPLY — 69 items
BAG DECANTER FOR FLEXI CONT (MISCELLANEOUS) ×2 IMPLANT
BANDAGE ADH SHEER 1  50/CT (GAUZE/BANDAGES/DRESSINGS) IMPLANT
BENZOIN TINCTURE PRP APPL 2/3 (GAUZE/BANDAGES/DRESSINGS) ×2 IMPLANT
BLADE SURG 11 STRL SS (BLADE) ×2 IMPLANT
BNDG GAUZE ELAST 4 BULKY (GAUZE/BANDAGES/DRESSINGS) IMPLANT
BUR ACORN 6.0 PRECISION (BURR) ×2 IMPLANT
CANISTER SUCT 3000ML PPV (MISCELLANEOUS) ×2 IMPLANT
CARTRIDGE OIL MAESTRO DRILL (MISCELLANEOUS) ×1 IMPLANT
CATH VENTRICULAR 9CM (Shunt) ×2 IMPLANT
CLIP RANEY DISP (INSTRUMENTS) ×2 IMPLANT
DERMABOND ADVANCED (GAUZE/BANDAGES/DRESSINGS) ×1
DERMABOND ADVANCED .7 DNX12 (GAUZE/BANDAGES/DRESSINGS) ×1 IMPLANT
DIFFUSER DRILL AIR PNEUMATIC (MISCELLANEOUS) ×2 IMPLANT
DRAPE INCISE IOBAN 85X60 (DRAPES) ×2 IMPLANT
DRAPE ORTHO SPLIT 77X108 STRL (DRAPES) ×2
DRAPE SURG 17X23 STRL (DRAPES) IMPLANT
DRAPE SURG ORHT 6 SPLT 77X108 (DRAPES) ×2 IMPLANT
DRSG OPSITE 4X5.5 SM (GAUZE/BANDAGES/DRESSINGS) IMPLANT
DRSG OPSITE POSTOP 3X4 (GAUZE/BANDAGES/DRESSINGS) ×6 IMPLANT
ELECT REM PT RETURN 9FT ADLT (ELECTROSURGICAL) ×2
ELECTRODE REM PT RTRN 9FT ADLT (ELECTROSURGICAL) ×1 IMPLANT
GAUZE 4X4 16PLY RFD (DISPOSABLE) IMPLANT
GLOVE BIO SURGEON STRL SZ7.5 (GLOVE) ×2 IMPLANT
GLOVE BIOGEL PI IND STRL 7.5 (GLOVE) ×1 IMPLANT
GLOVE BIOGEL PI INDICATOR 7.5 (GLOVE) ×1
GLOVE ECLIPSE 9.0 STRL (GLOVE) ×2 IMPLANT
GLOVE EXAM NITRILE LRG STRL (GLOVE) IMPLANT
GLOVE EXAM NITRILE XL STR (GLOVE) IMPLANT
GLOVE EXAM NITRILE XS STR PU (GLOVE) IMPLANT
GOWN STRL REUS W/ TWL LRG LVL3 (GOWN DISPOSABLE) IMPLANT
GOWN STRL REUS W/ TWL XL LVL3 (GOWN DISPOSABLE) IMPLANT
GOWN STRL REUS W/TWL 2XL LVL3 (GOWN DISPOSABLE) IMPLANT
GOWN STRL REUS W/TWL LRG LVL3 (GOWN DISPOSABLE)
GOWN STRL REUS W/TWL XL LVL3 (GOWN DISPOSABLE)
HEMOSTAT SURGICEL 2X14 (HEMOSTASIS) IMPLANT
KIT BASIN OR (CUSTOM PROCEDURE TRAY) ×2 IMPLANT
KIT TURNOVER KIT B (KITS) ×2 IMPLANT
MARKER SKIN DUAL TIP RULER LAB (MISCELLANEOUS) ×2 IMPLANT
NS IRRIG 1000ML POUR BTL (IV SOLUTION) ×2 IMPLANT
OIL CARTRIDGE MAESTRO DRILL (MISCELLANEOUS) ×2
PACK LAMINECTOMY NEURO (CUSTOM PROCEDURE TRAY) ×2 IMPLANT
PAD ARMBOARD 7.5X6 YLW CONV (MISCELLANEOUS) ×6 IMPLANT
RUBBERBAND STERILE (MISCELLANEOUS) IMPLANT
SHEATH PERITONEAL INTRO 46 (MISCELLANEOUS) ×2 IMPLANT
SHEATH PERITONEAL INTRO 61 (MISCELLANEOUS) IMPLANT
SHUNT STRATA 11 SNAP REG (Shunt) ×2 IMPLANT
SPONGE INTESTINAL PEANUT (DISPOSABLE) IMPLANT
SPONGE LAP 4X18 RFD (DISPOSABLE) IMPLANT
SPONGE SURGIFOAM ABS GEL SZ50 (HEMOSTASIS) ×2 IMPLANT
STAPLER VISISTAT 35W (STAPLE) ×2 IMPLANT
STRIP CLOSURE SKIN 1/2X4 (GAUZE/BANDAGES/DRESSINGS) ×4 IMPLANT
SUT CHROMIC 3 0 SH 27 (SUTURE) IMPLANT
SUT ETHILON 3 0 FSL (SUTURE) IMPLANT
SUT ETHILON 4 0 PS 2 18 (SUTURE) IMPLANT
SUT NURALON 4 0 TR CR/8 (SUTURE) IMPLANT
SUT SILK 0 TIES 10X30 (SUTURE) IMPLANT
SUT SILK 2 0 TIES 17X18 (SUTURE) ×1
SUT SILK 2-0 18XBRD TIE BLK (SUTURE) ×1 IMPLANT
SUT SILK 3 0 SH 30 (SUTURE) IMPLANT
SUT VIC AB 2-0 CT2 18 VCP726D (SUTURE) ×4 IMPLANT
SUT VIC AB 3-0 SH 8-18 (SUTURE) ×2 IMPLANT
SUT VICRYL 4-0 PS2 18IN ABS (SUTURE) IMPLANT
SYR 5ML LL (SYRINGE) IMPLANT
SYR CONTROL 10ML LL (SYRINGE) ×2 IMPLANT
TOWEL GREEN STERILE (TOWEL DISPOSABLE) ×2 IMPLANT
TOWEL GREEN STERILE FF (TOWEL DISPOSABLE) ×2 IMPLANT
TRAY FOLEY MTR SLVR 16FR STAT (SET/KITS/TRAYS/PACK) IMPLANT
UNDERPAD 30X30 (UNDERPADS AND DIAPERS) IMPLANT
WATER STERILE IRR 1000ML POUR (IV SOLUTION) ×2 IMPLANT

## 2018-07-22 NOTE — Progress Notes (Signed)
   07/22/18 1252  OBSTRUCTIVE SLEEP APNEA  Score 5 or greater  Results sent to PCP

## 2018-07-22 NOTE — H&P (Signed)
Charles Marquez is an 62 y.o. male.   Chief Complaint: Unsteadiness HPI: 63 year old male with progressive headache, unsteadiness and cognitive decline.  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  Patient now presents with worsening symptoms consistent with communicating hydrocephalus.  CT scan demonstrates progressive enlargement of his lateral ventricles.  Patient presents now with worsening symptoms of headache and vomiting.  Plan for placement of right occipital VP shunt.  Past Medical History:  Diagnosis Date  . Anxiety   . Hypercholesteremia   . 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 TIA (transient ischemic attack)    09.15    Past Surgical History:  Procedure Laterality Date  . Fractured arm Left 12  . HERNIA REPAIR Right 3/12  . LOOP RECORDER INSERTION N/A 04/10/2017   Procedure: Loop Recorder Insertion;  Surgeon: Thompson Grayer, MD;  Location: [facility];  Service: Cardiovascular;  Laterality: N/A;  . SHUNT REMOVAL Right 03/13/2016   Procedure: SHUNT REMOVAL;  Surgeon: Earnie Larsson, MD;  Location: [facility];  Service: Neurosurgery;  Laterality: Right;  [name] VASECTOMY  10/02/1997  . VENTRICULOPERITONEAL SHUNT Right 12/18/2014   Procedure: Shunt Placment - right occipital VP shunt ;  Surgeon: Charlie Pitter, MD;  Location: [facility];  Service: Neurosurgery;  Laterality: Right;  Shunt Placment - right occipital VP shunt     Family History  Problem Relation Age of Onset  . COPD Mother   . Lung cancer Father   . Alzheimer's disease Father    Social History:  reports that he has never smoked. He has never used smokeless tobacco. He reports that he drinks alcohol. He reports that he does not use drugs.  Allergies: No Known Allergies  Medications Prior to Admission  Medication Sig Dispense Refill  . ALPRAZolam (XANAX) 0.5 MG tablet Take 0.5 mg by mouth 3 (three) times daily as needed for sleep  or anxiety.    [name] atorvastatin (LIPITOR) 40 MG tablet Take 1 tablet (40 mg total) by mouth daily. 90 tablet 3  . cetirizine (ZYRTEC) 10 MG tablet Take 10 mg by mouth daily as needed for allergies.    [name] clopidogrel (PLAVIX) 75 MG tablet Take 1 tablet (75 mg total) by mouth daily. 90 tablet 0  . Glucosamine HCl (GLUCOSAMINE PO) Take 1 tablet by mouth once a week.    . Multiple Vitamins-Minerals (OCUVITE EYE HEATLH GUMMIES) CHEW Chew 2 each by mouth daily.    . Pseudoephedrine-Naproxen Na (SINUS & COLD-D PO) Take 1 tablet by mouth as needed (stuff nose).    . sodium chloride (OCEAN) 0.65 % SOLN nasal spray Place 1 spray into both nostrils as needed for congestion.      Results for orders placed or performed during the hospital encounter of 07/22/18 (from the past 48 hour(s))  Basic metabolic panel     Status: Abnormal   Collection Time: 07/22/18 12:57 PM  Result Value Ref Range   Sodium 140 135 - 145 mmol/L   Potassium 3.7 3.5 - 5.1 mmol/L   Chloride 103 98 - 111 mmol/L   CO2 27 22 - 32 mmol/L   Glucose, Bld 109 (H) 70 - 99 mg/dL   BUN 12 6 - 20 mg/dL   Creatinine, Ser 0.90 0.61 - 1.24 mg/dL   Calcium 9.1 8.9 - 10.3 mg/dL   GFR calc non Af Amer >60 >60 mL/min   GFR calc Af Amer >60 >  60 mL/min    Comment: (NOTE) The eGFR has been calculated using the CKD EPI equation. This calculation has not been validated in all clinical situations. eGFR's persistently <60 mL/min signify possible Chronic Kidney Disease.    Anion gap 10 5 - 15    Comment: Performed at [facility] [address] [place], [place] [address]  CBC WITH DIFFERENTIAL     Status: None   Collection Time: 07/22/18 12:57 PM  Result Value Ref Range   WBC 7.0 4.0 - 10.5 K/uL   RBC 4.32 4.22 - 5.81 MIL/uL   Hemoglobin 14.0 13.0 - 17.0 g/dL   HCT 42.0 39.0 - 52.0 %   MCV 97.2 78.0 - 100.0 fL   MCH 32.4 26.0 - 34.0 pg   MCHC 33.3 30.0 - 36.0 g/dL   RDW 12.3 11.5 - 15.5 %   Platelets 160 150 - 400 K/uL   Neutrophils  Relative % 79 %   Neutro Abs 5.5 1.7 - 7.7 K/uL   Lymphocytes Relative 13 %   Lymphs Abs 0.9 0.7 - 4.0 K/uL   Monocytes Relative 8 %   Monocytes Absolute 0.6 0.1 - 1.0 K/uL   Eosinophils Relative 0 %   Eosinophils Absolute 0.0 0.0 - 0.7 K/uL   Basophils Relative 0 %   Basophils Absolute 0.0 0.0 - 0.1 K/uL   Immature Granulocytes 0 %   Abs Immature Granulocytes 0.0 0.0 - 0.1 K/uL    Comment: Performed at [facility], [address], [place], [place] [address]   No results found.  Pertinent items noted in HPI and remainder of comprehensive ROS otherwise negative.  Blood pressure (!) 149/81, pulse (!) 51, temperature 97.8 F (36.6 C), temperature source Oral, resp. rate 20, height '5\' 11"'  (1.803 m), weight 86.6 kg, SpO2 100 %.  Patient is mildly somnolent.  He awakens easily.  Speech is fluent.  Judgment and insight are intact.  Cranial nerve function normal bilateral.  Motor examination normal bilateral.  Sensory examination normal bilaterally.  Examination head ears eyes nose throat demonstrates his prior surgical incision.  Abdomen soft.  Nontender.  No mass.  Chest clear.  Examination of the extremities is free from injury or deformity. Assessment/Plan Communicating hydrocephalus.  Plan right occipital VP shunt.  Risks and benefits of been explained.  Patient wishes to proceed.  Mallie Mussel A Koni Kannan 07/22/2018, 2:07 PM

## 2018-07-22 NOTE — Transfer of Care (Signed)
Immediate Anesthesia Transfer of Care Note  Patient: Charles Marquez  Procedure(s) Performed: Shunt Placment right occipital (Right )  Patient Location: PACU  Anesthesia Type:General  Level of Consciousness: awake and patient cooperative  Airway & Oxygen Therapy: Patient Spontanous Breathing and Patient connected to nasal cannula oxygen  Post-op Assessment: Report given to RN, Post -op Vital signs reviewed and stable and Patient moving all extremities  Post vital signs: Reviewed and stable  Last Vitals:  Vitals Value Taken Time  BP 145/93 07/22/2018  3:51 PM  Temp    Pulse 61 07/22/2018  3:52 PM  Resp 14 07/22/2018  3:52 PM  SpO2 100 % 07/22/2018  3:52 PM  Vitals shown include unvalidated device data.  Last Pain:  Vitals:   07/22/18 1304  TempSrc:   PainSc: 0-No pain         Complications: No apparent anesthesia complications

## 2018-07-22 NOTE — Anesthesia Preprocedure Evaluation (Addendum)
Anesthesia Evaluation  Patient identified by MRN, date of birth, ID band Patient awake    Reviewed: Allergy & Precautions, NPO status , Patient's Chart, lab work & pertinent test results  History of Anesthesia Complications Negative for: history of anesthetic complications  Airway Mallampati: II  TM Distance: >3 FB Neck ROM: Full    Dental  (+) Dental Advisory Given, Teeth Intact   Pulmonary neg pulmonary ROS,    breath sounds clear to auscultation       Cardiovascular  Rhythm:Regular Rate:Normal   TTE - EF 60% to 65%. Grade 2 diastolic dysfunction   Neuro/Psych Anxiety  Communicating hydrocephalus Dizziness  TIA   GI/Hepatic negative GI ROS, Neg liver ROS,   Endo/Other  negative endocrine ROS  Renal/GU negative Renal ROS  negative genitourinary   Musculoskeletal  (+) Arthritis ,   Abdominal   Peds  Hematology negative hematology ROS (+)   Anesthesia Other Findings   Reproductive/Obstetrics                            Anesthesia Physical Anesthesia Plan  ASA: III  Anesthesia Plan: General   Post-op Pain Management:    Induction: Intravenous  PONV Risk Score and Plan: 2 and Treatment may vary due to age or medical condition and Ondansetron  Airway Management Planned: Oral ETT  Additional Equipment: None  Intra-op Plan:   Post-operative Plan: Extubation in OR  Informed Consent: I have reviewed the patients History and Physical, chart, labs and discussed the procedure including the risks, benefits and alternatives for the proposed anesthesia with the patient or authorized representative who has indicated his/her understanding and acceptance.   Dental advisory given  Plan Discussed with: CRNA and Anesthesiologist  Anesthesia Plan Comments:        Anesthesia Quick Evaluation

## 2018-07-22 NOTE — Op Note (Signed)
Date of procedure: 07/22/2018  Date of dictation: Same  Service: Neurosurgery  Preoperative diagnosis: Communicating hydrocephalus  Postoperative diagnosis: Same  Procedure Name: Right occipital VP shunt  Surgeon:Vaida Kerchner A.Georgette Helmer, M.D.  Asst. Surgeon: Doran Durand, NP  Anesthesia: General  Indication: 62 year old male with progressive headache, unsteadiness, cognitive decline and intermittent worsening nausea vomiting.  Work-up demonstrates evidence of progressive communicate hydrocephalus.  Patient presents now for replacement of a right occipital VP shunt  Operative note: After induction of anesthesia, patient position supine with head turned to the left and neck slightly extended and right shoulder elevated.  Patient's right occipital region neck chest and abdomen were prepped and draped sterilely.  Incision made around the midline of his epigastric region.  This carried down sharply to the posterior rectus sheath.  Posterior rectus sheath was elevated and incised.  Preperitoneal space was identified.  The peritoneum was entered bluntly and intra-abdominal organs were visualized.  The right occipital wound was incised.  A pocket was made inferior to the wound for later shunt valve placement.  The prior bur hole was reopened using high-speed drill.  The shunt passer was then passed from the cranial wound to the abdominal wound.  A Medtronic strata 2 valve set at level 1.0 with an integral distal catheter was then passed into position.  The dura was coagulated and incised.  A 9 cm straight ventricular catheter was then passed into the lateral ventricle with good return of CSF under pressure.  This is then attached to the proximal snap connector.  CSF flowed easily through the distal catheter.  Returning to the abdominal wound once again the intra-abdominal space was visualized.  The shunt tube was passed directly into the intra-abdominal peritoneal cavity.  Wounds were then closed in a typical fashion  using Vicryl sutures.  Staples were applied to the skin.  Sterile dressings were applied.  There were no apparent complications.

## 2018-07-22 NOTE — Anesthesia Procedure Notes (Signed)
Procedure Name: Intubation Date/Time: 07/22/2018 2:42 PM Performed by: Moshe Salisbury, CRNA Pre-anesthesia Checklist: Patient identified, Emergency Drugs available, Suction available and Patient being monitored Patient Re-evaluated:Patient Re-evaluated prior to induction Oxygen Delivery Method: Circle System Utilized Preoxygenation: Pre-oxygenation with 100% oxygen Induction Type: IV induction Ventilation: Mask ventilation without difficulty Laryngoscope Size: Mac Grade View: Grade III Tube type: Oral Tube size: 8.0 mm Number of attempts: 1 Airway Equipment and Method: Stylet Placement Confirmation: ETT inserted through vocal cords under direct vision,  positive ETCO2 and breath sounds checked- equal and bilateral Secured at: 23 cm Tube secured with: Tape Dental Injury: Teeth and Oropharynx as per pre-operative assessment

## 2018-07-22 NOTE — Brief Op Note (Signed)
07/22/2018  3:59 PM  PATIENT:  Charles Marquez  62 y.o. male  PRE-OPERATIVE DIAGNOSIS:  Communicating hydrocephalus  POST-OPERATIVE DIAGNOSIS:  Communicating hydrocephalus  PROCEDURE:  Procedure(s): Shunt Placment right occipital (Right)  SURGEON:  Surgeon(s) and Role:    Julio Sicks, MD - Primary  PHYSICIAN ASSISTANT:   ASSISTANTSMarland Mcalpine   ANESTHESIA:   general  EBL:  Minimal   BLOOD ADMINISTERED:none  DRAINS: none   LOCAL MEDICATIONS USED:  NONE  SPECIMEN:  No Specimen  DISPOSITION OF SPECIMEN:  N/A  COUNTS:  YES  TOURNIQUET:  * No tourniquets in log *  DICTATION: .Dragon Dictation  PLAN OF CARE: Admit to inpatient   PATIENT DISPOSITION:  PACU - hemodynamically stable.   Delay start of Pharmacological VTE agent (>24hrs) due to surgical blood loss or risk of bleeding: yes

## 2018-07-22 NOTE — Telephone Encounter (Signed)
   [facility] Pre-operative Risk Assessment    Request for surgical clearance:  1. What type of surgery is being performed? RIGHT OCCIPITAL SHUNT PLACEMENT    2. When is this surgery scheduled? 08/05/18   3. Are there any medications that need to be held prior to surgery and how long?PLAVIX    4. Practice name and name of physician performing surgery? [facility]; DR. Mallie Mussel POOL   5. What is your office phone and fax number? PH# [phone]; FAX# [phone] EXT [phone] [id number]   3. Anesthesia type (None, local, MAC, general) ? GENERAL    Julaine Hua 07/22/2018, 3:54 PM  _________________________________________________________________   (provider comments below)

## 2018-07-23 ENCOUNTER — Encounter (HOSPITAL_COMMUNITY): Payer: Self-pay | Admitting: Neurosurgery

## 2018-07-23 MED ORDER — TRAMADOL HCL 50 MG PO TABS: 50.0000 mg | ORAL_TABLET | Freq: Four times a day (QID) | ORAL | 1 refills | Status: DC | PRN

## 2018-07-23 NOTE — Discharge Summary (Signed)
Physician Discharge Summary  Patient ID: Charles Marquez MRN: [id number] DOB/AGE: March 10, 1956 62 y.o.  Admit date: 07/22/2018 Discharge date: 07/23/2018  Admission Diagnoses: Communicating hydrocephalus  Discharge Diagnoses: Communicating hydrocephalus Active Problems:   Communicating hydrocephalus   Discharged Condition: good  Hospital Course: Patient admitted to the hospital where he underwent uncomplicated right occipital VP shunting.  Postoperatively doing well.  Preoperative headaches unsteadiness and nausea vomiting much improved.  No pain.  Walking well.  Feels ready for discharge home.  Consults:   Significant Diagnostic Studies:   Treatments:   Discharge Exam: Blood pressure 118/74, pulse (!) 51, temperature 98.8 F (37.1 C), temperature source Oral, resp. rate 18, height 5\' 11"  (1.803 m), weight 86.6 kg, SpO2 98 %. Awake and alert.  Oriented and appropriate.  Cranial nerve function intact.  Wounds clean and dry.  Chest and abdomen benign.  Motor and sensory function extremities normal.  Disposition: Discharge disposition: 01-Home or Self Care        Allergies as of 07/23/2018   No Known Allergies     Medication List    TAKE these medications   ALPRAZolam 0.5 MG tablet Commonly known as:  XANAX Take 0.5 mg by mouth 3 (three) times daily as needed for sleep or anxiety.   atorvastatin 40 MG tablet Commonly known as:  LIPITOR Take 1 tablet (40 mg total) by mouth daily.   cetirizine 10 MG tablet Commonly known as:  ZYRTEC Take 10 mg by mouth daily as needed for allergies.   clopidogrel 75 MG tablet Commonly known as:  PLAVIX Take 1 tablet (75 mg total) by mouth daily.   GLUCOSAMINE PO Take 1 tablet by mouth once a week.   OCUVITE EYE HEATLH GUMMIES Chew Chew 2 each by mouth daily.   SINUS & COLD-D PO Take 1 tablet by mouth as needed (stuff nose).   sodium chloride 0.65 % Soln nasal spray Commonly known as:  OCEAN Place 1 spray into both  nostrils as needed for congestion.   traMADol 50 MG tablet Commonly known as:  ULTRAM Take 1 tablet (50 mg total) by mouth every 6 (six) hours as needed.        Signed: Kathaleen Maser Frank Marquez 07/23/2018, 9:54 AM

## 2018-07-23 NOTE — Telephone Encounter (Addendum)
Spoke with Charles Marquez at Dr Lindalou Hose office and she stated patient came into the office with symptoms and Dr Jordan Likes sent patient ot OR for surgeon. She stated the surgery was emergent. She stated nothing was needed from our office.

## 2018-07-23 NOTE — Telephone Encounter (Signed)
Can you call [facility] back- it looks like this surgery was done today.  Corine Shelter PA-C 07/23/2018 4:27 PM

## 2018-07-23 NOTE — Anesthesia Postprocedure Evaluation (Signed)
Anesthesia Post Note  Patient: Charles Marquez  Procedure(s) Performed: Shunt Placment right occipital (Right )     Patient location during evaluation: PACU Anesthesia Type: General Level of consciousness: awake and alert Pain management: pain level controlled Vital Signs Assessment: post-procedure vital signs reviewed and stable Respiratory status: spontaneous breathing, nonlabored ventilation and respiratory function stable Cardiovascular status: blood pressure returned to baseline and stable Postop Assessment: no apparent nausea or vomiting Anesthetic complications: no    Last Vitals:  Vitals:   07/23/18 0346 07/23/18 0727  BP: (!) 127/92 118/74  Pulse: (!) 57 (!) 51  Resp: 20 18  Temp: 36.7 C 37.1 C  SpO2: 97% 98%    Last Pain:  Vitals:   07/23/18 0847  TempSrc:   PainSc: 4    Pain Goal: Patients Stated Pain Goal: 2 (07/23/18 0847)               Beryle Lathe

## 2018-07-23 NOTE — Discharge Instructions (Signed)

## 2018-07-23 NOTE — Progress Notes (Signed)
Discharged instructions/Education/AVS/Rx given to patient with wife at bedside and they both verbalized understanding. No drainage, no swelling, no  Redness noted on incision site. Pain is mild to moderate controlled by PRN pain medication. Ambulating well with minimal assist. Voiding and emptying bladder well. Discharged via wheelchair.

## 2018-07-25 ENCOUNTER — Ambulatory Visit (INDEPENDENT_AMBULATORY_CARE_PROVIDER_SITE_OTHER): Payer: BLUE CROSS/BLUE SHIELD | Admitting: *Deleted

## 2018-07-25 DIAGNOSIS — I639 Cerebral infarction, unspecified: Secondary | ICD-10-CM

## 2018-07-26 NOTE — Progress Notes (Signed)
Carelink Summary Report / Loop Recorder 

## 2018-08-01 LAB — CUP PACEART REMOTE DEVICE CHECK
MDC IDC PG IMPLANT DT: 20180529
MDC IDC SESS DTM: 20190811003533

## 2018-08-04 ENCOUNTER — Inpatient Hospital Stay (HOSPITAL_COMMUNITY)
Admission: EM | Admit: 2018-08-04 | Discharge: 2018-08-06 | DRG: 032 | Disposition: A | Discharge status: Home / Self Care | Payer: BLUE CROSS/BLUE SHIELD | Attending: Neurosurgery | Admitting: Neurosurgery

## 2018-08-04 ENCOUNTER — Encounter (HOSPITAL_COMMUNITY): Payer: Self-pay | Admitting: Emergency Medicine

## 2018-08-04 ENCOUNTER — Emergency Department (HOSPITAL_COMMUNITY): Payer: BLUE CROSS/BLUE SHIELD

## 2018-08-04 ENCOUNTER — Other Ambulatory Visit: Payer: Self-pay

## 2018-08-04 DIAGNOSIS — E785 Hyperlipidemia, unspecified: Secondary | ICD-10-CM | POA: Diagnosis present

## 2018-08-04 DIAGNOSIS — Z79891 Long term (current) use of opiate analgesic: Secondary | ICD-10-CM

## 2018-08-04 DIAGNOSIS — T8501XA Breakdown (mechanical) of ventricular intracranial (communicating) shunt, initial encounter: Secondary | ICD-10-CM | POA: Diagnosis present

## 2018-08-04 DIAGNOSIS — Z825 Family history of asthma and other chronic lower respiratory diseases: Secondary | ICD-10-CM | POA: Diagnosis not present

## 2018-08-04 DIAGNOSIS — Z23 Encounter for immunization: Secondary | ICD-10-CM | POA: Diagnosis not present

## 2018-08-04 DIAGNOSIS — T85730D Infection and inflammatory reaction due to ventricular intracranial (communicating) shunt, subsequent encounter: Secondary | ICD-10-CM | POA: Diagnosis not present

## 2018-08-04 DIAGNOSIS — E871 Hypo-osmolality and hyponatremia: Secondary | ICD-10-CM | POA: Diagnosis not present

## 2018-08-04 DIAGNOSIS — Z7902 Long term (current) use of antithrombotics/antiplatelets: Secondary | ICD-10-CM

## 2018-08-04 DIAGNOSIS — H532 Diplopia: Secondary | ICD-10-CM | POA: Diagnosis not present

## 2018-08-04 DIAGNOSIS — Y752 Prosthetic and other implants, materials and neurological devices associated with adverse incidents: Secondary | ICD-10-CM | POA: Diagnosis present

## 2018-08-04 DIAGNOSIS — E7800 Pure hypercholesterolemia, unspecified: Secondary | ICD-10-CM | POA: Diagnosis present

## 2018-08-04 DIAGNOSIS — Z982 Presence of cerebrospinal fluid drainage device: Secondary | ICD-10-CM

## 2018-08-04 DIAGNOSIS — T85618A Breakdown (mechanical) of other specified internal prosthetic devices, implants and grafts, initial encounter: Secondary | ICD-10-CM

## 2018-08-04 DIAGNOSIS — G910 Communicating hydrocephalus: Secondary | ICD-10-CM | POA: Diagnosis present

## 2018-08-04 DIAGNOSIS — Z8673 Personal history of transient ischemic attack (TIA), and cerebral infarction without residual deficits: Secondary | ICD-10-CM

## 2018-08-04 DIAGNOSIS — Z79899 Other long term (current) drug therapy: Secondary | ICD-10-CM | POA: Diagnosis not present

## 2018-08-04 DIAGNOSIS — J189 Pneumonia, unspecified organism: Secondary | ICD-10-CM | POA: Diagnosis not present

## 2018-08-04 DIAGNOSIS — E876 Hypokalemia: Secondary | ICD-10-CM | POA: Diagnosis not present

## 2018-08-04 DIAGNOSIS — F419 Anxiety disorder, unspecified: Secondary | ICD-10-CM | POA: Diagnosis present

## 2018-08-04 DIAGNOSIS — A86 Unspecified viral encephalitis: Secondary | ICD-10-CM | POA: Diagnosis not present

## 2018-08-04 DIAGNOSIS — G9340 Encephalopathy, unspecified: Secondary | ICD-10-CM | POA: Diagnosis not present

## 2018-08-04 DIAGNOSIS — G0490 Encephalitis and encephalomyelitis, unspecified: Secondary | ICD-10-CM | POA: Diagnosis not present

## 2018-08-04 DIAGNOSIS — G008 Other bacterial meningitis: Secondary | ICD-10-CM | POA: Diagnosis not present

## 2018-08-04 DIAGNOSIS — B961 Klebsiella pneumoniae [K. pneumoniae] as the cause of diseases classified elsewhere: Secondary | ICD-10-CM | POA: Diagnosis not present

## 2018-08-04 DIAGNOSIS — Z95828 Presence of other vascular implants and grafts: Secondary | ICD-10-CM | POA: Diagnosis not present

## 2018-08-04 DIAGNOSIS — T85730A Infection and inflammatory reaction due to ventricular intracranial (communicating) shunt, initial encounter: Secondary | ICD-10-CM | POA: Diagnosis not present

## 2018-08-04 DIAGNOSIS — G9349 Other encephalopathy: Secondary | ICD-10-CM | POA: Diagnosis not present

## 2018-08-04 LAB — CBC WITH DIFFERENTIAL/PLATELET
ABS IMMATURE GRANULOCYTES: 0 10*3/uL (ref 0.0–0.1)
Basophils Absolute: 0 10*3/uL (ref 0.0–0.1)
Basophils Relative: 0 %
Eosinophils Absolute: 0 10*3/uL (ref 0.0–0.7)
Eosinophils Relative: 0 %
HCT: 45.9 % (ref 39.0–52.0)
Hemoglobin: 14.8 g/dL (ref 13.0–17.0)
IMMATURE GRANULOCYTES: 0 %
Lymphocytes Relative: 7 %
Lymphs Abs: 0.8 10*3/uL (ref 0.7–4.0)
MCH: 31.2 pg (ref 26.0–34.0)
MCHC: 32.2 g/dL (ref 30.0–36.0)
MCV: 96.6 fL (ref 78.0–100.0)
MONOS PCT: 8 %
Monocytes Absolute: 1 10*3/uL (ref 0.1–1.0)
NEUTROS ABS: 10.1 10*3/uL — AB (ref 1.7–7.7)
NEUTROS PCT: 85 %
PLATELETS: 181 10*3/uL (ref 150–400)
RBC: 4.75 MIL/uL (ref 4.22–5.81)
RDW: 12.2 % (ref 11.5–15.5)
WBC: 12 10*3/uL — ABNORMAL HIGH (ref 4.0–10.5)

## 2018-08-04 LAB — COMPREHENSIVE METABOLIC PANEL
ALT: 22 U/L (ref 0–44)
AST: 20 U/L (ref 15–41)
Albumin: 3.8 g/dL (ref 3.5–5.0)
Alkaline Phosphatase: 46 U/L (ref 38–126)
Anion gap: 13 (ref 5–15)
BUN: 17 mg/dL (ref 6–20)
CHLORIDE: 98 mmol/L (ref 98–111)
CO2: 30 mmol/L (ref 22–32)
Calcium: 9.4 mg/dL (ref 8.9–10.3)
Creatinine, Ser: 1.05 mg/dL (ref 0.61–1.24)
Glucose, Bld: 114 mg/dL — ABNORMAL HIGH (ref 70–99)
Potassium: 4 mmol/L (ref 3.5–5.1)
Sodium: 141 mmol/L (ref 135–145)
Total Bilirubin: 0.8 mg/dL (ref 0.3–1.2)
Total Protein: 7.4 g/dL (ref 6.5–8.1)

## 2018-08-04 MED ORDER — ATORVASTATIN CALCIUM 40 MG PO TABS
40.0000 mg | ORAL_TABLET | Freq: Every day | ORAL | Status: DC
  Administered 2018-08-05 – 2018-08-06 (×2): 40 mg via ORAL
  Filled 2018-08-04: qty 2
  Filled 2018-08-04: qty 1

## 2018-08-04 MED ORDER — PROSIGHT PO TABS
1.0000 | ORAL_TABLET | Freq: Every day | ORAL | Status: DC
  Administered 2018-08-04 – 2018-08-06 (×3): 1 via ORAL
  Filled 2018-08-04 (×3): qty 1

## 2018-08-04 MED ORDER — ONDANSETRON HCL 4 MG/2ML IJ SOLN
4.0000 mg | Freq: Once | INTRAMUSCULAR | Status: AC
  Administered 2018-08-04: 4 mg via INTRAVENOUS
  Filled 2018-08-04: qty 2

## 2018-08-04 MED ORDER — ALPRAZOLAM 0.5 MG PO TABS
0.5000 mg | ORAL_TABLET | Freq: Three times a day (TID) | ORAL | Status: DC | PRN
  Administered 2018-08-04: 0.5 mg via ORAL
  Filled 2018-08-04: qty 2

## 2018-08-04 MED ORDER — TRAMADOL HCL 50 MG PO TABS: 50.0000 mg | ORAL_TABLET | Freq: Four times a day (QID) | ORAL | Status: DC | PRN

## 2018-08-04 MED ORDER — SODIUM CHLORIDE 0.9 % IV BOLUS
1000.0000 mL | Freq: Once | INTRAVENOUS | Status: AC
  Administered 2018-08-04: 1000 mL via INTRAVENOUS

## 2018-08-04 MED ORDER — INFLUENZA VAC SPLIT QUAD 0.5 ML IM SUSY
0.5000 mL | PREFILLED_SYRINGE | INTRAMUSCULAR | Status: AC
  Administered 2018-08-06: 0.5 mL via INTRAMUSCULAR
  Filled 2018-08-04: qty 0.5

## 2018-08-04 MED ORDER — SALINE SPRAY 0.65 % NA SOLN: 1.0000 | NASAL | Status: DC | PRN

## 2018-08-04 MED ORDER — LORATADINE 10 MG PO TABS
10.0000 mg | ORAL_TABLET | Freq: Every day | ORAL | Status: DC
  Administered 2018-08-05 – 2018-08-06 (×2): 10 mg via ORAL
  Filled 2018-08-04 (×2): qty 1

## 2018-08-04 MED ORDER — BOOST / RESOURCE BREEZE PO LIQD CUSTOM
1.0000 | Freq: Three times a day (TID) | ORAL | Status: DC
  Administered 2018-08-05 – 2018-08-06 (×2): 1 via ORAL

## 2018-08-04 NOTE — ED Triage Notes (Signed)
Wife stated, Started having some N/V last week went to see Dr. Dutch QuintPoole  Cause of history of Hydrocelphus., Dr. Dutch QuintPoole stated everything is ok. 3 years ago VP shunt put in. It came out . It came back and VP shunt reinserted 2 weeks ago. He started being not able to walk , a little confused and having N/V. Dr. Dutch QuintPoole stated to come here. I think his shunt needs to open more.

## 2018-08-04 NOTE — ED Notes (Signed)
Pt states he is not having pain "Just having symptoms" unable to explain his symptoms, wife reports new onset vomiting and worsening confusion over the past 3 days. Pt had shunt place 2 weeks ago.

## 2018-08-04 NOTE — ED Notes (Signed)
Pt in radiology. RN to draw with IV start

## 2018-08-04 NOTE — ED Provider Notes (Signed)
[facility] EMERGENCY DEPARTMENT Provider Note   CSN: [id number] Arrival date & time: 08/04/18  1446     History   Chief Complaint Chief Complaint  Patient presents with  . Abdominal Pain    VP shunt  . Emesis  . Nausea  . Altered Mental Status    HPI Larin Weissberg is a 62 y.o. male.  HPI   62 year old male with a history of occipital VP shunting July 22, 2018 with Dr. Dutch Quint, who presents with concern for unsteadiness, nausea and vomiting.  Wife reports that he had improved immediately following his VP shunt placement, however over the last 3 days, has developed nausea, vomiting and gait abnormalities.  Reports nausea and vomiting began on Friday, and was severe through Saturday.  Reports trying Zofran on Saturday, however patient continued to have emesis, and is unable able to keep anything down.  They discussed with Dr. Dutch Quint, and have come to the emergency department for further evaluation.  Wife reports that unsteady gait began yesterday.  Denies numbness, weakness, facial droop.  He had not described any visual changes until he was in triage, but denies at this time.  They deny any headaches.  Reports he has had constipation. No abdominal pain. No fevers.  Past Medical History:  Diagnosis Date  . Anxiety   . Hypercholesteremia   . Stroke (HCC)    tia's  . TIA (transient ischemic attack)    09.15    Patient Active Problem List   Diagnosis Date Noted  . TIA (transient ischemic attack) 01/29/2017  . Acute encephalopathy   . Shunt malfunction 03/13/2016  . 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 Hyperlipidemia 10/20/2014  . Degenerative disc disease, lumbar 04/15/2013  . Routine general medical examination at a health care facility 07/23/2012  . DISTURBANCE OF SKIN SENSATION 10/05/2008  . HYPERLIPIDEMIA 01/01/2008  . MYCOPLASMA PNEUMONIA 01/01/2008    Past Surgical History:    Procedure Laterality Date  . Fractured arm Left 12  . HERNIA REPAIR Right 3/12  . LOOP RECORDER INSERTION N/A 04/10/2017   Procedure: Loop Recorder Insertion;  Surgeon: Hillis Range, MD;  [facility]ASIVE [facility] LAB;  Service: Cardiovascular;  Laterality: N/A;  . SHUNT REMOVAL Right 03/13/2016   Procedure: SHUNT REMOVAL;  Surgeon: Julio Sicks, MD;  Location: [facility] NEURO ORS;  Service: Neurosurgery;  Laterality: Right;  [name] VASECTOMY  10/02/1997  . 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 Authorizing Provider  acetaminophen (TYLENOL) 500 MG tablet Take 500-1,000 mg by mouth every 6 (six) hours as needed for headache (pain).   Yes [provider]  atorvastatin (LIPITOR) 40 MG tablet Take 1 tablet (40 mg total) by mouth daily. 11/19/14  Yes Roderick Pee, MD  cetirizine (ZYRTEC) 10 MG tablet Take 10 mg by mouth daily as needed (seasonal allergies).    Yes [provider]  clopidogrel (PLAVIX) 75 MG tablet Take 1 tablet (75 mg total) by mouth daily.  01/14/15  Yes Roderick Peeodd, Jeffrey A, MD  Multiple Vitamins-Minerals [facility](OCUVITE EYE HEALTH FORMULA) CAPS Take 1 capsule by mouth daily.   Yes [provider]  ondansetron (ZOFRAN) 4 MG tablet Take 4 mg by mouth 4 (four) times daily as needed for nausea or vomiting.   Yes [provider]  OVER THE COUNTER MEDICATION Take 1 tablet by mouth once. dramamine   Yes [provider]  OVER THE COUNTER MEDICATION Take 1 tablet by mouth daily as needed  (congestion). Over the counter sinus medication   Yes [provider]  sodium chloride (OCEAN) 0.65 % SOLN nasal spray Place 1 spray into both nostrils as needed for congestion.   Yes [provider]  traMADol (ULTRAM) 50 MG tablet Take 1 tablet (50 mg total) by mouth every 6 (six) hours as needed. Patient taking differently: Take 50 mg by mouth at bedtime as needed (pain).  07/23/18  Yes Julio SicksPool, Henry, MD    Family History Family History  Problem Relation Age of Onset  . COPD Mother   . Lung cancer Father   . Alzheimer's disease Father     Social History Social History   Tobacco Use  . Smoking status: Never Smoker  . Smokeless tobacco: Never Used  Substance Use Topics  . Alcohol use: Yes    Alcohol/week: 0.0 standard drinks    Comment: ocassionally  . Drug use: No     Allergies   Patient has no known allergies.   Review of Systems Review of Systems  Constitutional: Negative for fever.  HENT: Negative for sore throat.   Eyes: Negative for visual disturbance.  Respiratory: Negative for shortness of breath.   Cardiovascular: Negative for chest pain.  Gastrointestinal: Positive for nausea and vomiting. Negative for abdominal pain.  Genitourinary: Negative for difficulty urinating.  Musculoskeletal: Positive for gait problem. Negative for back pain and neck stiffness.  Skin: Negative for rash.  Neurological: Negative for syncope, weakness, numbness and headaches.     Physical Exam Updated Vital Signs BP (!) 146/88   Pulse (!) 53   Temp 98.1 F (36.7 C) (Oral)   Resp 16   Ht 5\' 11"  (1.803 m)   Wt 82.8 kg   SpO2 96%   BMI 25.46 kg/m   Physical Exam  Constitutional: He appears well-developed and well-nourished. No distress.  HENT:  Head: Normocephalic and atraumatic.  Eyes: Conjunctivae and EOM are normal.  Neck: Normal range of motion.  Cardiovascular: Normal rate, regular rhythm, normal heart sounds and intact distal pulses. Exam reveals no  gallop and no friction rub.  No murmur heard. Pulmonary/Chest: Effort normal and breath sounds normal. No respiratory distress. He has no wheezes. He has no rales.  Abdominal: Soft. He exhibits no distension. There is no tenderness. There is no guarding.  Musculoskeletal: He exhibits no edema.  Neurological: He is alert. He has normal strength. No cranial nerve deficit or sensory deficit. Coordination normal. Abnormal gait: deferred, unsteady when standing from wheelchair to bed. GCS eye subscore is 4. GCS verbal subscore is 5. GCS motor subscore is 6.  Skin: Skin is warm and dry. He is not diaphoretic.  Nursing note and vitals reviewed.    ED Treatments / Results  Labs (all labs ordered are listed, but only abnormal results are displayed) Labs Reviewed  CBC WITH DIFFERENTIAL/PLATELET - Abnormal; Notable for the following components:      Result Value   WBC 12.0 (*)    Neutro Abs 10.1 (*)  All other components within normal limits  COMPREHENSIVE METABOLIC PANEL - Abnormal; Notable for the following components:   Glucose, Bld 114 (*)    All other components within normal limits  SURGICAL PCR SCREEN  HIV ANTIBODY (ROUTINE TESTING W REFLEX)    EKG EKG Interpretation  Date/Time:  Sunday August 04 2018 16:28:02 EDT Ventricular Rate:  52 PR Interval:    QRS Duration: 87 QT Interval:  430 QTC Calculation: 400 R Axis:   43 Text Interpretation:  Sinus rhythm Atrial premature complex Borderline T wave abnormalities TW changes in lateral leads new from prior Confirmed by Alvira Monday ([id number]) on 08/04/2018 4:33:28 PM   Radiology Dg Skull 1-3 Views  Result Date: 08/04/2018 CLINICAL DATA:  Evaluate for shunt malfunction EXAM: SKULL - 1-3 VIEW COMPARISON:  CT 07/19/2018 FINDINGS: Right VP shunt in place. This appears intact. No acute bony abnormality. IMPRESSION: Right VP shunt appears intact. Electronically Signed   By: Charlett Nose M.D.   On: 08/04/2018 17:00   Dg Chest 1  View  Result Date: 08/04/2018 CLINICAL DATA:  Evaluate for shunt malfunction EXAM: CHEST  1 VIEW COMPARISON:  03/13/2016 FINDINGS: Right VP shunt appears intact within the lower neck and chest. Lungs are clear. Heart is upper limits normal in size. No effusions or acute bony abnormality. IMPRESSION: Right VP shunt appears intact. No active disease. Electronically Signed   By: Charlett Nose M.D.   On: 08/04/2018 17:01   Dg Abd 1 View  Result Date: 08/04/2018 CLINICAL DATA:  Evaluate for shunt malfunction EXAM: ABDOMEN - 1 VIEW COMPARISON:  None. FINDINGS: Normal bowel gas pattern. No free air organomegaly. VP shunt catheter courses into the lower abdomen then courses superiorly in the left lateral abdomen with the tip terminating in the left upper quadrant. Catheter appears intact. IMPRESSION: No acute findings. VP shunt catheter appears intact, terminating in the left upper quadrant. Electronically Signed   By: Charlett Nose M.D.   On: 08/04/2018 17:02   Ct Head Wo Contrast  Result Date: 08/04/2018 CLINICAL DATA:  Breakdown of ventricular shunt. EXAM: CT HEAD WITHOUT CONTRAST TECHNIQUE: Contiguous axial images were obtained from the base of the skull through the vertex without intravenous contrast. COMPARISON:  07/19/2018 FINDINGS: Brain: Interval placement of a right parietal ventricular catheter. Stable ventricular enlargement and low-density throughout the periventricular white matter. No hemorrhage or hydrocephalus. Vascular: No hyperdense vessel or unexpected calcification. Skull: No acute calvarial abnormality. Sinuses/Orbits: Visualized paranasal sinuses and mastoids clear. Orbital soft tissues unremarkable. Other: VP shunt catheter appears intact. IMPRESSION: Right parietal VP shunt in place. Ventricles remain dilated with periventricular low-density throughout the deep white matter. No change since prior study. Electronically Signed   By: Charlett Nose M.D.   On: 08/04/2018 17:50     Procedures Procedures (including critical care time)  Medications Ordered in ED Medications  traMADol (ULTRAM) tablet 50 mg (has no administration in time range)  atorvastatin (LIPITOR) tablet 40 mg (has no administration in time range)  ALPRAZolam (XANAX) tablet 0.5 mg (0.5 mg Oral Given 08/04/18 2340)  multivitamin (PROSIGHT) tablet 1 tablet (1 tablet Oral Given 08/04/18 2340)  loratadine (CLARITIN) tablet 10 mg (has no administration in time range)  sodium chloride (OCEAN) 0.65 % nasal spray 1 spray (has no administration in time range)  Influenza vac split quadrivalent PF (FLUARIX) injection 0.5 mL (has no administration in time range)  feeding supplement (BOOST / RESOURCE BREEZE) liquid 1 Container (has no administration in time range)  ondansetron (ZOFRAN) injection  4 mg (4 mg Intravenous Given 08/04/18 1723)  sodium chloride 0.9 % bolus 1,000 mL (0 mLs Intravenous Stopped 08/04/18 1928)     Initial Impression / Assessment and Plan / ED Course  I have reviewed the triage vital signs and the nursing notes.  Pertinent labs & imaging results that were available during my care of the patient were reviewed by me and considered in my medical decision making (see chart for details).     62 year old male with a history of occipital VP shunting for communicating hydrocephalus July 22, 2018 with Dr. Dutch Quint, who presents with concern for unsteadiness, nausea and vomiting.  CT head completed and without change from previous. Shunt series without abnormalities. Labs obtained given emesis show no significant abnormalities.  Discussed with [facility] Dr. Jordan Likes. Dr. Jordan Likes came to bedside to evaluate the patient, will admit for shunt revision tomorrow AM, suspect proximal VP shunt malfunction.  Final Clinical Impressions(s) / ED Diagnoses   Final diagnoses:  S/P VP shunt  Shunt malfunction, initial encounter    ED Discharge Orders    None       Alvira Monday, MD 08/05/18 0134

## 2018-08-04 NOTE — H&P (Signed)
Charles Marquez is an 62 y.o. male.   Chief Complaint: Unsteadiness HPI: 62 year old male approximately 2 weeks status post right occipital VP shunt placement.  Patient had been doing well until the last 3 days when he has had difficulty with nausea and vomiting and some recurrent unsteadiness of gait.  His level of consciousness is good.  He has minimal if any headache.  He has no fever.  He has no abdominal pain.  Past Medical History:  Diagnosis Date  . Anxiety   . Hypercholesteremia   . Stroke ([place])    tia's  . TIA (transient ischemic attack)    09.15    Past Surgical History:  Procedure Laterality Date  . Fractured arm Left 12  . HERNIA REPAIR Right 3/12  . LOOP RECORDER INSERTION N/A 04/10/2017   Procedure: Loop Recorder Insertion;  Surgeon: Thompson Grayer, MD;  Location: [place] CV LAB;  Service: Cardiovascular;  Laterality: N/A;  . SHUNT REMOVAL Right 03/13/2016   Procedure: SHUNT REMOVAL;  Surgeon: Earnie Larsson, MD;  Location: [facility]O [facility]S;  Service: Neurosurgery;  Laterality: Right;  [name] VASECTOMY  10/02/1997  . 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 COPD Mother   . Lung cancer Father   . Alzheimer's disease Father    Social History:  reports that he has never smoked. He has never used smokeless tobacco. He reports that he drinks alcohol. He reports that he does not use drugs.  Allergies: No Known Allergies   (Not in a hospital admission)  Results for orders placed or performed during the hospital encounter of 08/04/18 (from the past 48  hour(s))  CBC with Differential     Status: Abnormal   Collection Time: 08/04/18  5:03 PM  Result Value Ref Range   WBC 12.0 (H) 4.0 - 10.5 K/uL   RBC 4.75 4.22 - 5.81 MIL/uL   Hemoglobin 14.8 13.0 - 17.0 g/dL   HCT 45.9 39.0 - 52.0 %   MCV 96.6 78.0 - 100.0 fL   MCH 31.2 26.0 - 34.0 pg   MCHC 32.2 30.0 - 36.0 g/dL   RDW 12.2 11.5 - 15.5 %   Platelets 181 150 - 400 K/uL   Neutrophils Relative % 85 %   Neutro Abs 10.1 (H) 1.7 - 7.7 K/uL   Lymphocytes Relative 7 %   Lymphs Abs 0.8 0.7 - 4.0 K/uL   Monocytes Relative 8 %   Monocytes Absolute 1.0 0.1 - 1.0 K/uL   Eosinophils Relative 0 %   Eosinophils Absolute 0.0 0.0 - 0.7 K/uL   Basophils Relative 0 %   Basophils Absolute 0.0 0.0 - 0.1 K/uL   Immature Granulocytes 0 %   Abs Immature Granulocytes 0.0 0.0 - 0.1 K/uL    Comment: Performed at [facility], [address] [place], [place] [address]  Comprehensive metabolic panel     Status: Abnormal   Collection Time: 08/04/18  5:03 PM  Result Value Ref Range   Sodium 141 135 - 145 mmol/L   Potassium 4.0 3.5 - 5.1 mmol/L  Chloride 98 98 - 111 mmol/L   CO2 30 22 - 32 mmol/L   Glucose, Bld 114 (H) 70 - 99 mg/dL   BUN 17 6 - 20 mg/dL   Creatinine, Ser 1.05 0.61 - 1.24 mg/dL   Calcium 9.4 8.9 - 10.3 mg/dL   Total Protein 7.4 6.5 - 8.1 g/dL   Albumin 3.8 3.5 - 5.0 g/dL   AST 20 15 - 41 U/L   ALT 22 0 - 44 U/L   Alkaline Phosphatase 46 38 - 126 U/L   Total Bilirubin 0.8 0.3 - 1.2 mg/dL   GFR calc non Af Amer >60 >60 mL/min   GFR calc Af Amer >60 >60 mL/min    Comment: (NOTE) The eGFR has been calculated using the CKD EPI equation. This calculation has not been validated in all clinical situations. eGFR's persistently <60 mL/min signify possible Chronic Kidney Disease.    Anion gap 13 5 - 15    Comment: Performed at [facility] [address], [place], [place] [address]   Dg Skull 1-3 Views  Result Date: 08/04/2018 CLINICAL DATA:  Evaluate for shunt  malfunction EXAM: SKULL - 1-3 VIEW COMPARISON:  CT 07/19/2018 FINDINGS: Right VP shunt in place. This appears intact. No acute bony abnormality. IMPRESSION: Right VP shunt appears intact. Electronically Signed   By: Rolm Baptise M.D.   On: 08/04/2018 17:00   Dg Chest 1 View  Result Date: 08/04/2018 CLINICAL DATA:  Evaluate for shunt malfunction EXAM: CHEST  1 VIEW COMPARISON:  03/13/2016 FINDINGS: Right VP shunt appears intact within the lower neck and chest. Lungs are clear. Heart is upper limits normal in size. No effusions or acute bony abnormality. IMPRESSION: Right VP shunt appears intact. No active disease. Electronically Signed   By: Rolm Baptise M.D.   On: 08/04/2018 17:01   Dg Abd 1 View  Result Date: 08/04/2018 CLINICAL DATA:  Evaluate for shunt malfunction EXAM: ABDOMEN - 1 VIEW COMPARISON:  None. FINDINGS: Normal bowel gas pattern. No free air organomegaly. VP shunt catheter courses into the lower abdomen then courses superiorly in the left lateral abdomen with the tip terminating in the left upper quadrant. Catheter appears intact. IMPRESSION: No acute findings. VP shunt catheter appears intact, terminating in the left upper quadrant. Electronically Signed   By: Rolm Baptise M.D.   On: 08/04/2018 17:02   Ct Head Wo Contrast  Result Date: 08/04/2018 CLINICAL DATA:  Breakdown of ventricular shunt. EXAM: CT HEAD WITHOUT CONTRAST TECHNIQUE: Contiguous axial images were obtained from the base of the skull through the vertex without intravenous contrast. COMPARISON:  07/19/2018 FINDINGS: Brain: Interval placement of a right parietal ventricular catheter. Stable ventricular enlargement and low-density throughout the periventricular white matter. No hemorrhage or hydrocephalus. Vascular: No hyperdense vessel or unexpected calcification. Skull: No acute calvarial abnormality. Sinuses/Orbits: Visualized paranasal sinuses and mastoids clear. Orbital soft tissues unremarkable. Other: VP shunt catheter  appears intact. IMPRESSION: Right parietal VP shunt in place. Ventricles remain dilated with periventricular low-density throughout the deep white matter. No change since prior study. Electronically Signed   By: Rolm Baptise M.D.   On: 08/04/2018 17:50    Pertinent items noted in HPI and remainder of comprehensive ROS otherwise negative.  Blood pressure (!) 156/84, pulse (!) 56, temperature 97.8 F (36.6 C), temperature source Oral, resp. rate 19, height '5\' 11"'  (1.803 m), weight 86.2 kg, SpO2 100 %.  Patient is awake and alert.  He is oriented and appropriate.  He is  nontoxic.  He is afebrile.  Neck is supple.  Speech is fluent.  Wounds are clean and dry.  Neurologically he is intact.  His shunt pumps but does not refill easily. Assessment/Plan Patient has signs and symptoms of proximal VP shunt malfunction most likely secondary to proximal catheter obstruction.  I discussed situation with the patient.  We will return to the operating room tomorrow morning for VP shunt revision.  I discussed the risks and benefits and he wishes to proceed.  Cooper Render Jermeka Schlotterbeck 08/04/2018, 8:28 PM

## 2018-08-05 ENCOUNTER — Inpatient Hospital Stay (HOSPITAL_COMMUNITY): Payer: BLUE CROSS/BLUE SHIELD | Admitting: Certified Registered"

## 2018-08-05 ENCOUNTER — Encounter (HOSPITAL_COMMUNITY)
Admission: EM | Disposition: A | Discharge status: Home / Self Care | Payer: Self-pay | Source: Home / Self Care | Attending: Neurosurgery

## 2018-08-05 ENCOUNTER — Other Ambulatory Visit: Payer: Self-pay

## 2018-08-05 HISTORY — PX: SHUNT REVISION: SHX343

## 2018-08-05 LAB — TYPE AND SCREEN
ABO/RH(D): A POS
Antibody Screen: NEGATIVE

## 2018-08-05 LAB — ABO/RH: ABO/RH(D): A POS

## 2018-08-05 LAB — SURGICAL PCR SCREEN
MRSA, PCR: NEGATIVE
STAPHYLOCOCCUS AUREUS: NEGATIVE

## 2018-08-05 LAB — HIV ANTIBODY (ROUTINE TESTING W REFLEX): HIV Screen 4th Generation wRfx: NONREACTIVE

## 2018-08-05 SURGERY — SHUNT REVISION
Anesthesia: General | Site: Head | Laterality: Right

## 2018-08-05 MED ORDER — 0.9 % SODIUM CHLORIDE (POUR BTL) OPTIME
TOPICAL | Status: DC | PRN
  Administered 2018-08-05: 1000 mL

## 2018-08-05 MED ORDER — BACITRACIN ZINC 500 UNIT/GM EX OINT
TOPICAL_OINTMENT | CUTANEOUS | Status: AC
  Filled 2018-08-05: qty 28.35

## 2018-08-05 MED ORDER — VANCOMYCIN HCL IN DEXTROSE 1-5 GM/200ML-% IV SOLN
1000.0000 mg | INTRAVENOUS | Status: AC
  Administered 2018-08-05: 1000 mg via INTRAVENOUS

## 2018-08-05 MED ORDER — MIDAZOLAM HCL 2 MG/2ML IJ SOLN
INTRAMUSCULAR | Status: AC
  Filled 2018-08-05: qty 2

## 2018-08-05 MED ORDER — ONDANSETRON HCL 4 MG/2ML IJ SOLN
INTRAMUSCULAR | Status: AC
  Filled 2018-08-05: qty 2

## 2018-08-05 MED ORDER — FENTANYL CITRATE (PF) 100 MCG/2ML IJ SOLN
INTRAMUSCULAR | Status: DC | PRN
  Administered 2018-08-05: 25 ug via INTRAVENOUS
  Administered 2018-08-05: 50 ug via INTRAVENOUS

## 2018-08-05 MED ORDER — PROPOFOL 10 MG/ML IV BOLUS
INTRAVENOUS | Status: AC
  Filled 2018-08-05: qty 20

## 2018-08-05 MED ORDER — ROCURONIUM BROMIDE 50 MG/5ML IV SOSY
PREFILLED_SYRINGE | INTRAVENOUS | Status: AC
  Filled 2018-08-05: qty 5

## 2018-08-05 MED ORDER — ROCURONIUM BROMIDE 10 MG/ML (PF) SYRINGE
PREFILLED_SYRINGE | INTRAVENOUS | Status: DC | PRN
  Administered 2018-08-05: 50 mg via INTRAVENOUS

## 2018-08-05 MED ORDER — VANCOMYCIN HCL IN DEXTROSE 1-5 GM/200ML-% IV SOLN
1000.0000 mg | Freq: Once | INTRAVENOUS | Status: AC
  Administered 2018-08-06: 1000 mg via INTRAVENOUS
  Filled 2018-08-05: qty 200

## 2018-08-05 MED ORDER — SUCCINYLCHOLINE CHLORIDE 200 MG/10ML IV SOSY
PREFILLED_SYRINGE | INTRAVENOUS | Status: AC
  Filled 2018-08-05: qty 10

## 2018-08-05 MED ORDER — LIDOCAINE 2% (20 MG/ML) 5 ML SYRINGE
INTRAMUSCULAR | Status: DC | PRN
  Administered 2018-08-05: 90 mg via INTRAVENOUS

## 2018-08-05 MED ORDER — ACETAMINOPHEN 650 MG RE SUPP: 650.0000 mg | RECTAL | Status: DC | PRN

## 2018-08-05 MED ORDER — HYDROCODONE-ACETAMINOPHEN 10-325 MG PO TABS: 2.0000 | ORAL_TABLET | ORAL | Status: DC | PRN

## 2018-08-05 MED ORDER — SODIUM CHLORIDE 0.9% FLUSH: 3.0000 mL | INTRAVENOUS | Status: DC | PRN

## 2018-08-05 MED ORDER — ACETAMINOPHEN 325 MG PO TABS
650.0000 mg | ORAL_TABLET | ORAL | Status: DC | PRN
  Administered 2018-08-06: 650 mg via ORAL
  Filled 2018-08-05: qty 2

## 2018-08-05 MED ORDER — PHENOL 1.4 % MT LIQD: 1.0000 | OROMUCOSAL | Status: DC | PRN

## 2018-08-05 MED ORDER — VANCOMYCIN HCL IN DEXTROSE 1-5 GM/200ML-% IV SOLN
INTRAVENOUS | Status: AC
  Filled 2018-08-05: qty 200

## 2018-08-05 MED ORDER — SODIUM CHLORIDE 0.9 % IV SOLN
250.0000 mL | INTRAVENOUS | Status: DC
  Administered 2018-08-06: 250 mL via INTRAVENOUS

## 2018-08-05 MED ORDER — MENTHOL 3 MG MT LOZG: 1.0000 | LOZENGE | OROMUCOSAL | Status: DC | PRN

## 2018-08-05 MED ORDER — ONDANSETRON HCL 4 MG PO TABS: 4.0000 mg | ORAL_TABLET | Freq: Four times a day (QID) | ORAL | Status: DC | PRN

## 2018-08-05 MED ORDER — HYDROMORPHONE HCL 1 MG/ML IJ SOLN: 0.2500 mg | INTRAMUSCULAR | Status: DC | PRN

## 2018-08-05 MED ORDER — ACETAMINOPHEN 500 MG PO TABS: 500.0000 mg | ORAL_TABLET | Freq: Four times a day (QID) | ORAL | Status: DC | PRN

## 2018-08-05 MED ORDER — VANCOMYCIN HCL 1000 MG IV SOLR
INTRAVENOUS | Status: AC
  Filled 2018-08-05: qty 1000

## 2018-08-05 MED ORDER — MIDAZOLAM HCL 2 MG/2ML IJ SOLN
INTRAMUSCULAR | Status: DC | PRN
  Administered 2018-08-05: 2 mg via INTRAVENOUS

## 2018-08-05 MED ORDER — PROPOFOL 10 MG/ML IV BOLUS
INTRAVENOUS | Status: DC | PRN
  Administered 2018-08-05: 160 mg via INTRAVENOUS

## 2018-08-05 MED ORDER — SUGAMMADEX SODIUM 200 MG/2ML IV SOLN
INTRAVENOUS | Status: DC | PRN
  Administered 2018-08-05: 400 mg via INTRAVENOUS

## 2018-08-05 MED ORDER — LIDOCAINE-EPINEPHRINE 1 %-1:100000 IJ SOLN
INTRAMUSCULAR | Status: AC
  Filled 2018-08-05: qty 1

## 2018-08-05 MED ORDER — HYDROMORPHONE HCL 1 MG/ML IJ SOLN: 1.0000 mg | INTRAMUSCULAR | Status: DC | PRN

## 2018-08-05 MED ORDER — VANCOMYCIN HCL 1000 MG IV SOLR
INTRAVENOUS | Status: DC | PRN
  Administered 2018-08-05: 1000 mg via TOPICAL

## 2018-08-05 MED ORDER — LIDOCAINE 2% (20 MG/ML) 5 ML SYRINGE
INTRAMUSCULAR | Status: AC
  Filled 2018-08-05: qty 5

## 2018-08-05 MED ORDER — ONDANSETRON HCL 4 MG/2ML IJ SOLN
INTRAMUSCULAR | Status: DC | PRN
  Administered 2018-08-05: 4 mg via INTRAVENOUS

## 2018-08-05 MED ORDER — ONDANSETRON HCL 4 MG/2ML IJ SOLN
4.0000 mg | Freq: Four times a day (QID) | INTRAMUSCULAR | Status: DC | PRN
  Administered 2018-08-06: 4 mg via INTRAVENOUS
  Filled 2018-08-05: qty 2

## 2018-08-05 MED ORDER — DEXAMETHASONE SODIUM PHOSPHATE 10 MG/ML IJ SOLN
INTRAMUSCULAR | Status: DC | PRN
  Administered 2018-08-05: 10 mg via INTRAVENOUS

## 2018-08-05 MED ORDER — SODIUM CHLORIDE 0.9% IV SOLUTION
Freq: Once | INTRAVENOUS | Status: AC
  Administered 2018-08-05: 10:00:00 via INTRAVENOUS

## 2018-08-05 MED ORDER — OCUVITE EYE HEALTH FORMULA PO CAPS: 1.0000 | ORAL_CAPSULE | Freq: Every day | ORAL | Status: DC

## 2018-08-05 MED ORDER — HYDROCODONE-ACETAMINOPHEN 5-325 MG PO TABS
1.0000 | ORAL_TABLET | ORAL | Status: DC | PRN
  Administered 2018-08-05: 1 via ORAL
  Filled 2018-08-05: qty 1

## 2018-08-05 MED ORDER — PHENYLEPHRINE 40 MCG/ML (10ML) SYRINGE FOR IV PUSH (FOR BLOOD PRESSURE SUPPORT)
PREFILLED_SYRINGE | INTRAVENOUS | Status: AC
  Filled 2018-08-05: qty 10

## 2018-08-05 MED ORDER — SODIUM CHLORIDE 0.9 % IV SOLN
INTRAVENOUS | Status: DC | PRN
  Administered 2018-08-05: 15:00:00

## 2018-08-05 MED ORDER — FENTANYL CITRATE (PF) 250 MCG/5ML IJ SOLN
INTRAMUSCULAR | Status: AC
  Filled 2018-08-05: qty 5

## 2018-08-05 MED ORDER — LACTATED RINGERS IV SOLN
INTRAVENOUS | Status: DC | PRN
  Administered 2018-08-05: 14:00:00 via INTRAVENOUS

## 2018-08-05 MED ORDER — ROCURONIUM BROMIDE 50 MG/5ML IV SOSY
PREFILLED_SYRINGE | INTRAVENOUS | Status: AC
  Filled 2018-08-05: qty 10

## 2018-08-05 MED ORDER — SODIUM CHLORIDE 0.9% FLUSH
3.0000 mL | Freq: Two times a day (BID) | INTRAVENOUS | Status: DC
  Administered 2018-08-05 – 2018-08-06 (×2): 3 mL via INTRAVENOUS

## 2018-08-05 MED ORDER — KETOROLAC TROMETHAMINE 15 MG/ML IJ SOLN
30.0000 mg | Freq: Four times a day (QID) | INTRAMUSCULAR | Status: AC
  Administered 2018-08-05 – 2018-08-06 (×4): 30 mg via INTRAVENOUS
  Filled 2018-08-05 (×4): qty 2

## 2018-08-05 SURGICAL SUPPLY — 68 items
BAG DECANTER FOR FLEXI CONT (MISCELLANEOUS) ×2 IMPLANT
BANDAGE ADH SHEER 1  50/CT (GAUZE/BANDAGES/DRESSINGS) IMPLANT
BENZOIN TINCTURE PRP APPL 2/3 (GAUZE/BANDAGES/DRESSINGS) IMPLANT
BLADE SURG 11 STRL SS (BLADE) IMPLANT
BNDG GAUZE ELAST 4 BULKY (GAUZE/BANDAGES/DRESSINGS) IMPLANT
BUR ACORN 6.0 PRECISION (BURR) IMPLANT
CANISTER SUCT 3000ML PPV (MISCELLANEOUS) ×2 IMPLANT
CARTRIDGE OIL MAESTRO DRILL (MISCELLANEOUS) IMPLANT
CATH VENTRICULAR SHUNT 11 (STENTS) ×2 IMPLANT
CLIP RANEY DISP (INSTRUMENTS) IMPLANT
DERMABOND ADVANCED (GAUZE/BANDAGES/DRESSINGS)
DERMABOND ADVANCED .7 DNX12 (GAUZE/BANDAGES/DRESSINGS) IMPLANT
DIFFUSER DRILL AIR PNEUMATIC (MISCELLANEOUS) IMPLANT
DRAPE INCISE IOBAN 85X60 (DRAPES) IMPLANT
DRAPE ORTHO SPLIT 77X108 STRL (DRAPES) ×1
DRAPE SURG 17X23 STRL (DRAPES) IMPLANT
DRAPE SURG ORHT 6 SPLT 77X108 (DRAPES) ×1 IMPLANT
DRSG OPSITE 4X5.5 SM (GAUZE/BANDAGES/DRESSINGS) IMPLANT
DRSG OPSITE POSTOP 3X4 (GAUZE/BANDAGES/DRESSINGS) ×2 IMPLANT
ELECT REM PT RETURN 9FT ADLT (ELECTROSURGICAL) ×2
ELECTRODE REM PT RTRN 9FT ADLT (ELECTROSURGICAL) ×1 IMPLANT
GAUZE SPONGE 4X4 12PLY STRL (GAUZE/BANDAGES/DRESSINGS) IMPLANT
GLOVE BIOGEL PI IND STRL 7.5 (GLOVE) ×2 IMPLANT
GLOVE BIOGEL PI INDICATOR 7.5 (GLOVE) ×2
GLOVE ECLIPSE 9.0 STRL (GLOVE) ×4 IMPLANT
GLOVE EXAM NITRILE LRG STRL (GLOVE) IMPLANT
GLOVE EXAM NITRILE XL STR (GLOVE) IMPLANT
GLOVE EXAM NITRILE XS STR PU (GLOVE) IMPLANT
GLOVE SURG SS PI 7.0 STRL IVOR (GLOVE) ×4 IMPLANT
GOWN STRL REUS W/ TWL LRG LVL3 (GOWN DISPOSABLE) ×1 IMPLANT
GOWN STRL REUS W/ TWL XL LVL3 (GOWN DISPOSABLE) ×1 IMPLANT
GOWN STRL REUS W/TWL 2XL LVL3 (GOWN DISPOSABLE) IMPLANT
GOWN STRL REUS W/TWL LRG LVL3 (GOWN DISPOSABLE) ×1
GOWN STRL REUS W/TWL XL LVL3 (GOWN DISPOSABLE) ×1
HEMOSTAT SURGICEL 2X14 (HEMOSTASIS) IMPLANT
KIT BASIN OR (CUSTOM PROCEDURE TRAY) ×2 IMPLANT
KIT TURNOVER KIT B (KITS) ×2 IMPLANT
MARKER SKIN DUAL TIP RULER LAB (MISCELLANEOUS) ×2 IMPLANT
NS IRRIG 1000ML POUR BTL (IV SOLUTION) ×2 IMPLANT
OIL CARTRIDGE MAESTRO DRILL (MISCELLANEOUS)
PACK LAMINECTOMY NEURO (CUSTOM PROCEDURE TRAY) ×2 IMPLANT
PAD ARMBOARD 7.5X6 YLW CONV (MISCELLANEOUS) ×6 IMPLANT
PATTIES SURGICAL .5 X3 (DISPOSABLE) IMPLANT
RUBBERBAND STERILE (MISCELLANEOUS) IMPLANT
SHEATH PERITONEAL INTRO 46 (MISCELLANEOUS) IMPLANT
SHEATH PERITONEAL INTRO 61 (MISCELLANEOUS) IMPLANT
SPONGE INTESTINAL PEANUT (DISPOSABLE) IMPLANT
SPONGE LAP 4X18 RFD (DISPOSABLE) ×2 IMPLANT
SPONGE SURGIFOAM ABS GEL SZ50 (HEMOSTASIS) IMPLANT
STAPLER VISISTAT 35W (STAPLE) ×2 IMPLANT
STRIP CLOSURE SKIN 1/2X4 (GAUZE/BANDAGES/DRESSINGS) IMPLANT
SUT CHROMIC 3 0 SH 27 (SUTURE) IMPLANT
SUT ETHILON 3 0 FSL (SUTURE) IMPLANT
SUT ETHILON 4 0 PS 2 18 (SUTURE) IMPLANT
SUT NURALON 4 0 TR CR/8 (SUTURE) IMPLANT
SUT SILK 0 TIES 10X30 (SUTURE) IMPLANT
SUT SILK 2 0 TIES 17X18 (SUTURE)
SUT SILK 2-0 18XBRD TIE BLK (SUTURE) IMPLANT
SUT SILK 3 0 SH 30 (SUTURE) IMPLANT
SUT VIC AB 2-0 CT2 18 VCP726D (SUTURE) ×2 IMPLANT
SUT VIC AB 3-0 SH 8-18 (SUTURE) IMPLANT
SUT VICRYL 4-0 PS2 18IN ABS (SUTURE) IMPLANT
SYR 5ML LL (SYRINGE) IMPLANT
TOWEL GREEN STERILE (TOWEL DISPOSABLE) ×2 IMPLANT
TOWEL GREEN STERILE FF (TOWEL DISPOSABLE) ×2 IMPLANT
TRAY FOLEY MTR SLVR 16FR STAT (SET/KITS/TRAYS/PACK) IMPLANT
UNDERPAD 30X30 (UNDERPADS AND DIAPERS) IMPLANT
WATER STERILE IRR 1000ML POUR (IV SOLUTION) ×2 IMPLANT

## 2018-08-05 NOTE — Anesthesia Postprocedure Evaluation (Signed)
dd Anesthesia Post Note  Patient: Charles Marquez  Procedure(s) Performed: SHUNT REVISION (Right Head)     Patient location during evaluation: PACU Anesthesia Type: General Level of consciousness: awake Pain management: pain level controlled Vital Signs Assessment: post-procedure vital signs reviewed and stable Respiratory status: spontaneous breathing Cardiovascular status: stable Postop Assessment: no apparent nausea or vomiting Anesthetic complications: no    Last Vitals:  Vitals:   08/05/18 1530 08/05/18 1550  BP:    Pulse: (!) 47   Resp: 14   Temp:  36.7 C  SpO2: 98%     Last Pain:  Vitals:   08/05/18 1550  TempSrc: Oral  PainSc:                  Alvan Culpepper

## 2018-08-05 NOTE — Anesthesia Procedure Notes (Signed)
Procedure Name: Intubation Date/Time: 08/05/2018 1:51 PM Performed by: Leonor Liv, CRNA Pre-anesthesia Checklist: Patient identified, Emergency Drugs available, Suction available and Patient being monitored Patient Re-evaluated:Patient Re-evaluated prior to induction Oxygen Delivery Method: Circle System Utilized Preoxygenation: Pre-oxygenation with 100% oxygen Induction Type: IV induction Ventilation: Mask ventilation without difficulty Laryngoscope Size: Mac and 4 Grade View: Grade II Tube type: Oral Tube size: 7.5 mm Number of attempts: 1 Airway Equipment and Method: Stylet and Oral airway Placement Confirmation: ETT inserted through vocal cords under direct vision,  positive ETCO2 and breath sounds checked- equal and bilateral Secured at: 23 cm Tube secured with: Tape Dental Injury: Teeth and Oropharynx as per pre-operative assessment

## 2018-08-05 NOTE — Transfer of Care (Signed)
Immediate Anesthesia Transfer of Care Note  Patient: Charles Marquez  Procedure(s) Performed: SHUNT REVISION (Right Head)  Patient Location: PACU  Anesthesia Type:General  Level of Consciousness: sedated  Airway & Oxygen Therapy: Patient Spontanous Breathing and Patient connected to nasal cannula oxygen  Post-op Assessment: Report given to RN and Post -op Vital signs reviewed and stable  Post vital signs: Reviewed and stable  Last Vitals:  Vitals Value Taken Time  BP 129/96 08/05/2018  2:32 PM  Temp    Pulse 50 08/05/2018  2:34 PM  Resp 12 08/05/2018  2:34 PM  SpO2 99 % 08/05/2018  2:34 PM  Vitals shown include unvalidated device data.  Last Pain:  Vitals:   08/05/18 1237  TempSrc: Oral  PainSc:          Complications: No apparent anesthesia complications

## 2018-08-05 NOTE — Progress Notes (Signed)
Pharmacy Antibiotic Note  Charles Marquez is a 62 y.o. male admitted on 08/04/2018 for planned VP shunt revision. Pharmacy has been consulted for Vancomycin dosing post-op.  No additional drain in place per RN report. Pre-op Vanc given at 1330  Plan: - Vancomycin 1g IV x 1 dose at 0130 on 9/24 - Pharmacy will sign off as no additional doses warranted at this time   Height: 5\' 11"  (180.3 cm) Weight: 182 lb 8.7 oz (82.8 kg) IBW/kg (Calculated) : 75.3  Temp (24hrs), Avg:97.9 F (36.6 C), Min:97.6 F (36.4 C), Max:98.2 F (36.8 C)  Recent Labs  Lab 08/04/18 1703  WBC 12.0*  CREATININE 1.05    Estimated Creatinine Clearance: 79.7 mL/min (by C-G formula based on SCr of 1.05 mg/dL).    No Known Allergies  Thank you for allowing pharmacy to be a part of this patient's care.  Georgina PillionElizabeth Angline Schweigert, PharmD, BCPS Clinical Pharmacist Pager: [phone] Please check [facility] for all [facility] 08/05/2018 4:04 PM

## 2018-08-05 NOTE — Brief Op Note (Signed)
08/05/2018  2:20 PM  PATIENT:  Charles Marquez  62 y.o. male  PRE-OPERATIVE DIAGNOSIS:  Shunt malfunction  POST-OPERATIVE DIAGNOSIS:  * No post-op diagnosis entered *  PROCEDURE:  Procedure(s): SHUNT REVISION (Right)  SURGEON:  Surgeon(s) and Role:    * Julio SicksPool, Domingo Fuson, MD - Primary  PHYSICIAN ASSISTANT:   ASSISTANTS:    ANESTHESIA:   general  EBL:  5 mL   BLOOD ADMINISTERED:none  DRAINS: none   LOCAL MEDICATIONS USED:  NONE  SPECIMEN:  No Specimen  DISPOSITION OF SPECIMEN:  N/A  COUNTS:  YES  TOURNIQUET:  * No tourniquets in log *  DICTATION: .Dragon Dictation  PLAN OF CARE: Admit to inpatient   PATIENT DISPOSITION:  PACU - hemodynamically stable.   Delay start of Pharmacological VTE agent (>24hrs) due to surgical blood loss or risk of bleeding: yes

## 2018-08-05 NOTE — Progress Notes (Signed)
   08/05/18 1400  Clinical Encounter Type  Visited With Health care provider;Patient not available;Other (Comment) (pt in surgery)  Visit Type Initial;Other (Comment) (AD consult request)   Went to surgical area on 2, inquired of staff of the location of the pt to do AD creation/update per consult request and they reported that he was in surgery and went in starting at 2pm.  Please enter new consult request for chaplain visit or AD creation if still desired.  Margretta SidleAndrea M Hodge Stachnik Chaplain resident, [phone]

## 2018-08-05 NOTE — Op Note (Signed)
Date of procedure: 08/05/2018  Date of dictation: Same  Service: Neurosurgery  Preoperative diagnosis: VP shunt malfunction, proximal catheter occlusion  Postoperative diagnosis: Same  Procedure Name: Right occipital VP shunt revision  Surgeon:Hearl Heikes A.Madyx Delfin, M.D.  Asst. Surgeon: None  Anesthesia: General  Indication: [age] year old male recently status post right occipital VP shunt.  Patient initially with very good relief of his symptoms but now has a few days of worsening headache, nausea vomiting and unsteadiness.  Work-up demonstrates evidence of progressive ventriculomegaly.  Shunt valve pumps but does not refill easily.  Catheter tip appears to be obstructed with choroid plexus.  Patient presents now for shunt revision.  Operative note: After induction of anesthesia, patient position supine with head turned to the left and his right occipital region was prepped and draped sterilely.  Right occipital wound was reopened.  Self-retaining retractor was placed.  The proximal shunt assembly was disassembled.  The proximal catheter did not drain CSF initially but upon withdrawing it slightly it drained CSF briskly.  This catheter was removed.  A new 11 cm catheter was then passed in the lateral ventricle with good return of CSF under pressure.  This was then attached to the proximal aspect of the VP shunt valve system and integral distal catheter.  The shunt valve then pumped and refilled very easily without evidence of any obstruction.  Wound was then irrigated fanlike solution.  Vancomycin powder was placed within the wound.  Wound is then closed in a typical fashion.  No apparent complications.  Patient tolerated the procedure well and he returns to the recovery room postop.

## 2018-08-05 NOTE — Anesthesia Preprocedure Evaluation (Addendum)
Anesthesia Evaluation  Patient identified by MRN, date of birth, ID band Patient awake    Reviewed: Allergy & Precautions, H&P , NPO status , Patient's Chart, lab work & pertinent test results  Airway Mallampati: II  TM Distance: >3 FB Neck ROM: Full    Dental no notable dental hx. (+) Teeth Intact, Dental Advisory Given   Pulmonary neg pulmonary ROS,    Pulmonary exam normal breath sounds clear to auscultation       Cardiovascular negative cardio ROS   Rhythm:Regular Rate:Normal     Neuro/Psych Anxiety Hydrocephalus TIA   GI/Hepatic negative GI ROS, Neg liver ROS,   Endo/Other  negative endocrine ROS  Renal/GU negative Renal ROS  negative genitourinary   Musculoskeletal  (+) Arthritis , Osteoarthritis,    Abdominal   Peds  Hematology negative hematology ROS (+)   Anesthesia Other Findings   Reproductive/Obstetrics negative OB ROS                            Anesthesia Physical Anesthesia Plan  ASA: III  Anesthesia Plan: General   Post-op Pain Management:    Induction: Intravenous  PONV Risk Score and Plan: 3 and Ondansetron, Dexamethasone and Midazolam  Airway Management Planned: Oral ETT  Additional Equipment:   Intra-op Plan:   Post-operative Plan: Extubation in OR  Informed Consent: I have reviewed the patients History and Physical, chart, labs and discussed the procedure including the risks, benefits and alternatives for the proposed anesthesia with the patient or authorized representative who has indicated his/her understanding and acceptance.   Dental advisory given  Plan Discussed with: CRNA  Anesthesia Plan Comments:        Anesthesia Quick Evaluation

## 2018-08-05 NOTE — Progress Notes (Signed)
Initial Nutrition Assessment  DOCUMENTATION CODES:   Not applicable  INTERVENTION:  Continue Boost Breeze po TID, each supplement provides 250 kcal and 9 grams of protein.  Encourage adequate PO intake.   NUTRITION DIAGNOSIS:   Increased nutrient needs related to post-op healing as evidenced by estimated needs.  GOAL:   Patient will meet greater than or equal to 90% of their needs  MONITOR:   PO intake, Supplement acceptance, Labs, Weight trends, I & O's, Skin  REASON FOR ASSESSMENT:   Malnutrition Screening Tool    ASSESSMENT:   62 year old male approximately 2 weeks status post right occipital VP shunt placement.  Patient had been doing well until the last 3 days when he has had difficulty with nausea and vomiting and some recurrent unsteadiness of gait.    Procedure (9/23): Right occipital VP shunt revision  Pt unavailable during time of visit. RD able to speak with family members. Family reports pt was eating well up until 3 days PTA with onset of n/v and unable to keep any po down. Pt currently has Boost Breeze ordered. Will continue with current orders to aid in caloric and protein needs.   Unable to complete Nutrition-Focused physical exam at this time.   Labs and medications reviewed.   Diet Order:   Diet Order            Diet regular Room service appropriate? Yes; Fluid consistency: Thin  Diet effective now              EDUCATION NEEDS:   Not appropriate for education at this time  Skin:  Skin Assessment: Skin Integrity Issues: Skin Integrity Issues:: Incisions Incisions: head  Last BM:  9/19  Height:   Ht Readings from Last 1 Encounters:  08/04/18 5\' 11"  (1.803 m)    Weight:   Wt Readings from Last 1 Encounters:  08/04/18 82.8 kg    Ideal Body Weight:  78 kg  BMI:  Body mass index is 25.46 kg/m.  Estimated Nutritional Needs:   Kcal:  2050-2250  Protein:  100-110 grams  Fluid:  2- 2.2 L/day    Roslyn SmilingStephanie Jahnai [place], MS, RD,  LDN Pager # [phone] After hours/ weekend pager # [phone]

## 2018-08-06 LAB — PREPARE PLATELET PHERESIS: Unit division: 0

## 2018-08-06 LAB — BPAM PLATELET PHERESIS
BLOOD PRODUCT EXPIRATION DATE: 201909232359
ISSUE DATE / TIME: 201909230920
Unit Type and Rh: 5100

## 2018-08-06 NOTE — Discharge Summary (Signed)
Physician Discharge Summary  Patient ID: Celene SquibbKurt Edward Keeter MRN: [id number] DOB/AGE: December 19, 1955 62 y.o.  Admit date: 08/04/2018 Discharge date: 08/06/2018  Admission Diagnoses:  Discharge Diagnoses:  Active Problems:   Communicating hydrocephalus   Discharged Condition: good  Hospital Course: Patient admitted to the hospital for evaluation of VP shunt malfunction.  Patient returned to the operating room for VP shunt revision with change of his ventricular catheter.  Postoperatively doing very well.  No headache.  Mood, memory and stability much improved.  Nausea vomiting resolved.  No wound issues.  Ready for discharge home.  Consults:   Significant Diagnostic Studies:   Treatments:   Discharge Exam: Blood pressure 134/81, pulse 95, temperature 97.6 F (36.4 C), temperature source Oral, resp. rate (!) 21, height 5\' 11"  (1.803 m), weight 82.8 kg, SpO2 97 %. Awake and alert.  Oriented and appropriate.  Cranial nerve function intact.  Motor and sensory function extremities normal.  Wounds clean and dry.  Chest and abdomen benign.  Shunt pumps and refills easily.  Disposition: Discharge disposition: 01-Home or Self Care        Allergies as of 08/06/2018   No Known Allergies     Medication List    TAKE these medications   acetaminophen 500 MG tablet Commonly known as:  TYLENOL Take 500-1,000 mg by mouth every 6 (six) hours as needed for headache (pain).   atorvastatin 40 MG tablet Commonly known as:  LIPITOR Take 1 tablet (40 mg total) by mouth daily.   cetirizine 10 MG tablet Commonly known as:  ZYRTEC Take 10 mg by mouth daily as needed (seasonal allergies).   clopidogrel 75 MG tablet Commonly known as:  PLAVIX Take 1 tablet (75 mg total) by mouth daily.   OCUVITE EYE HEALTH FORMULA Caps Take 1 capsule by mouth daily.   ondansetron 4 MG tablet Commonly known as:  ZOFRAN Take 4 mg by mouth 4 (four) times daily as needed for nausea or vomiting.   OVER THE  COUNTER MEDICATION Take 1 tablet by mouth once. dramamine   OVER THE COUNTER MEDICATION Take 1 tablet by mouth daily as needed (congestion). Over the counter sinus medication   sodium chloride 0.65 % Soln nasal spray Commonly known as:  OCEAN Place 1 spray into both nostrils as needed for congestion.   traMADol 50 MG tablet Commonly known as:  ULTRAM Take 1 tablet (50 mg total) by mouth every 6 (six) hours as needed. What changed:    when to take this  reasons to take this        Signed: Temple PaciniHenry A Shafter Jupin 08/06/2018, 10:49 AM

## 2018-08-06 NOTE — Discharge Instructions (Signed)

## 2018-08-07 ENCOUNTER — Encounter (HOSPITAL_COMMUNITY): Payer: Self-pay | Admitting: Neurosurgery

## 2018-08-08 ENCOUNTER — Encounter (HOSPITAL_COMMUNITY)
Admission: EM | Disposition: A | Discharge status: Home / Self Care | Payer: Self-pay | Source: Home / Self Care | Attending: Neurosurgery

## 2018-08-08 ENCOUNTER — Emergency Department (HOSPITAL_COMMUNITY): Payer: BLUE CROSS/BLUE SHIELD

## 2018-08-08 ENCOUNTER — Other Ambulatory Visit: Payer: Self-pay

## 2018-08-08 ENCOUNTER — Inpatient Hospital Stay (HOSPITAL_COMMUNITY)
Admission: EM | Admit: 2018-08-08 | Discharge: 2018-08-22 | Disposition: A | Discharge status: Home / Self Care | Payer: BLUE CROSS/BLUE SHIELD | Source: Home / Self Care | Attending: Neurosurgery | Admitting: Neurosurgery

## 2018-08-08 ENCOUNTER — Inpatient Hospital Stay: Admit: 2018-08-08 | Payer: BLUE CROSS/BLUE SHIELD | Admitting: Neurosurgery

## 2018-08-08 DIAGNOSIS — J189 Pneumonia, unspecified organism: Secondary | ICD-10-CM

## 2018-08-08 DIAGNOSIS — G008 Other bacterial meningitis: Secondary | ICD-10-CM

## 2018-08-08 DIAGNOSIS — G9340 Encephalopathy, unspecified: Secondary | ICD-10-CM | POA: Diagnosis present

## 2018-08-08 DIAGNOSIS — Y95 Nosocomial condition: Secondary | ICD-10-CM

## 2018-08-08 DIAGNOSIS — E876 Hypokalemia: Secondary | ICD-10-CM

## 2018-08-08 DIAGNOSIS — A86 Unspecified viral encephalitis: Secondary | ICD-10-CM

## 2018-08-08 DIAGNOSIS — Z7902 Long term (current) use of antithrombotics/antiplatelets: Secondary | ICD-10-CM

## 2018-08-08 DIAGNOSIS — Z79899 Other long term (current) drug therapy: Secondary | ICD-10-CM

## 2018-08-08 DIAGNOSIS — T85730A Infection and inflammatory reaction due to ventricular intracranial (communicating) shunt, initial encounter: Secondary | ICD-10-CM | POA: Diagnosis present

## 2018-08-08 DIAGNOSIS — G0490 Encephalitis and encephalomyelitis, unspecified: Secondary | ICD-10-CM

## 2018-08-08 DIAGNOSIS — E7800 Pure hypercholesterolemia, unspecified: Secondary | ICD-10-CM

## 2018-08-08 DIAGNOSIS — E871 Hypo-osmolality and hyponatremia: Secondary | ICD-10-CM

## 2018-08-08 DIAGNOSIS — Z8673 Personal history of transient ischemic attack (TIA), and cerebral infarction without residual deficits: Secondary | ICD-10-CM

## 2018-08-08 LAB — COMPREHENSIVE METABOLIC PANEL
ALBUMIN: 2.8 g/dL — AB (ref 3.5–5.0)
ALT: 14 U/L (ref 0–44)
AST: 15 U/L (ref 15–41)
Alkaline Phosphatase: 38 U/L (ref 38–126)
Anion gap: 8 (ref 5–15)
BUN: 14 mg/dL (ref 6–20)
CHLORIDE: 96 mmol/L — AB (ref 98–111)
CO2: 28 mmol/L (ref 22–32)
CREATININE: 0.95 mg/dL (ref 0.61–1.24)
Calcium: 8 mg/dL — ABNORMAL LOW (ref 8.9–10.3)
GFR calc Af Amer: >60 mL/min (ref >60)
GFR calc non Af Amer: >60 mL/min (ref >60)
Glucose, Bld: 111 mg/dL — ABNORMAL HIGH (ref 70–99)
POTASSIUM: 3.1 mmol/L — AB (ref 3.5–5.1)
SODIUM: 132 mmol/L — AB (ref 135–145)
Total Bilirubin: 1.3 mg/dL — ABNORMAL HIGH (ref 0.3–1.2)
Total Protein: 5.8 g/dL — ABNORMAL LOW (ref 6.5–8.1)

## 2018-08-08 LAB — CBC WITH DIFFERENTIAL/PLATELET
Abs Immature Granulocytes: 0.1 10*3/uL (ref 0.0–0.1)
Basophils Absolute: 0 10*3/uL (ref 0.0–0.1)
Basophils Relative: 0 %
EOS ABS: 0 10*3/uL (ref 0.0–0.7)
EOS PCT: 0 %
HEMATOCRIT: 35.1 % — AB (ref 39.0–52.0)
HEMOGLOBIN: 11.6 g/dL — AB (ref 13.0–17.0)
IMMATURE GRANULOCYTES: 1 %
LYMPHS ABS: 0.8 10*3/uL (ref 0.7–4.0)
LYMPHS PCT: 6 %
MCH: 31.4 pg (ref 26.0–34.0)
MCHC: 33 g/dL (ref 30.0–36.0)
MCV: 94.9 fL (ref 78.0–100.0)
MONOS PCT: 7 %
Monocytes Absolute: 0.9 10*3/uL (ref 0.1–1.0)
Neutro Abs: 11.1 10*3/uL — ABNORMAL HIGH (ref 1.7–7.7)
Neutrophils Relative %: 86 %
Platelets: 178 10*3/uL (ref 150–400)
RBC: 3.7 MIL/uL — ABNORMAL LOW (ref 4.22–5.81)
RDW: 11.9 % (ref 11.5–15.5)
WBC: 12.9 10*3/uL — ABNORMAL HIGH (ref 4.0–10.5)

## 2018-08-08 LAB — URINALYSIS, ROUTINE W REFLEX MICROSCOPIC
Bilirubin Urine: NEGATIVE
GLUCOSE, UA: NEGATIVE mg/dL
Hgb urine dipstick: NEGATIVE
Ketones, ur: 5 mg/dL — AB
LEUKOCYTES UA: NEGATIVE
NITRITE: NEGATIVE
PH: 7 (ref 5.0–8.0)
Protein, ur: NEGATIVE mg/dL
SPECIFIC GRAVITY, URINE: 1.018 (ref 1.005–1.030)

## 2018-08-08 LAB — CSF CELL COUNT WITH DIFFERENTIAL
Eosinophils, CSF: NONE SEEN % (ref 0–1)
Lymphs, CSF: 2 % — ABNORMAL LOW (ref 40–80)
Monocyte-Macrophage-Spinal Fluid: 1 % — ABNORMAL LOW (ref 15–45)
RBC Count, CSF: UNDETERMINED /mm3
SEGMENTED NEUTROPHILS-CSF: 97 % — AB (ref 0–6)
Tube #: 3
WBC, CSF: UNDETERMINED /mm3 (ref 0–5)

## 2018-08-08 LAB — TYPE AND SCREEN
ABO/RH(D): A POS
ANTIBODY SCREEN: NEGATIVE

## 2018-08-08 LAB — I-STAT CG4 LACTIC ACID, ED: Lactic Acid, Venous: 1.15 mmol/L (ref 0.5–1.9)

## 2018-08-08 LAB — PROCALCITONIN: PROCALCITONIN: 0.62 ng/mL

## 2018-08-08 SURGERY — REVISION, SHUNT, VENTRICULOPERITONEAL
Anesthesia: General | Laterality: Right

## 2018-08-08 MED ORDER — ACETAMINOPHEN 325 MG PO TABS: 650.0000 mg | ORAL_TABLET | Freq: Once | ORAL | Status: DC

## 2018-08-08 MED ORDER — POTASSIUM CHLORIDE IN NACL 20-0.9 MEQ/L-% IV SOLN
INTRAVENOUS | Status: DC
  Administered 2018-08-08: 21:00:00 via INTRAVENOUS
  Filled 2018-08-08 (×2): qty 1000

## 2018-08-08 MED ORDER — ROCURONIUM BROMIDE 50 MG/5ML IV SOSY
PREFILLED_SYRINGE | INTRAVENOUS | Status: AC
  Filled 2018-08-08: qty 5

## 2018-08-08 MED ORDER — ACETAMINOPHEN 650 MG RE SUPP
650.0000 mg | Freq: Four times a day (QID) | RECTAL | Status: DC | PRN
  Administered 2018-08-09: 650 mg via RECTAL
  Filled 2018-08-08: qty 1

## 2018-08-08 MED ORDER — MIDAZOLAM HCL 2 MG/2ML IJ SOLN
INTRAMUSCULAR | Status: AC
  Filled 2018-08-08: qty 2

## 2018-08-08 MED ORDER — ATORVASTATIN CALCIUM 40 MG PO TABS
40.0000 mg | ORAL_TABLET | Freq: Every day | ORAL | Status: DC
  Administered 2018-08-08 – 2018-08-22 (×15): 40 mg via ORAL
  Filled 2018-08-08 (×15): qty 1

## 2018-08-08 MED ORDER — ONDANSETRON HCL 4 MG/2ML IJ SOLN
4.0000 mg | Freq: Four times a day (QID) | INTRAMUSCULAR | Status: DC | PRN
  Administered 2018-08-09: 4 mg via INTRAVENOUS

## 2018-08-08 MED ORDER — FENTANYL CITRATE (PF) 250 MCG/5ML IJ SOLN
INTRAMUSCULAR | Status: AC
  Filled 2018-08-08: qty 5

## 2018-08-08 MED ORDER — VANCOMYCIN HCL IN DEXTROSE 1-5 GM/200ML-% IV SOLN
1000.0000 mg | Freq: Three times a day (TID) | INTRAVENOUS | Status: DC
  Administered 2018-08-09: 1000 mg via INTRAVENOUS
  Filled 2018-08-08 (×2): qty 200

## 2018-08-08 MED ORDER — ACETAMINOPHEN 325 MG PO TABS
650.0000 mg | ORAL_TABLET | Freq: Four times a day (QID) | ORAL | Status: DC | PRN
  Administered 2018-08-08 – 2018-08-12 (×5): 650 mg via ORAL
  Filled 2018-08-08 (×5): qty 2

## 2018-08-08 MED ORDER — SODIUM CHLORIDE 0.9 % IV SOLN
INTRAVENOUS | Status: DC
  Filled 2018-08-08: qty 1000

## 2018-08-08 MED ORDER — SODIUM CHLORIDE 0.9 % IV SOLN
2.0000 g | Freq: Once | INTRAVENOUS | Status: AC
  Administered 2018-08-08: 2 g via INTRAVENOUS
  Filled 2018-08-08: qty 2

## 2018-08-08 MED ORDER — VANCOMYCIN HCL 10 G IV SOLR
1500.0000 mg | Freq: Once | INTRAVENOUS | Status: DC
  Filled 2018-08-08: qty 1500

## 2018-08-08 MED ORDER — PROPOFOL 10 MG/ML IV BOLUS
INTRAVENOUS | Status: AC
  Filled 2018-08-08: qty 20

## 2018-08-08 MED ORDER — LIDOCAINE 2% (20 MG/ML) 5 ML SYRINGE
INTRAMUSCULAR | Status: AC
  Filled 2018-08-08: qty 5

## 2018-08-08 MED ORDER — SODIUM CHLORIDE 0.9 % IV SOLN
2.0000 g | Freq: Three times a day (TID) | INTRAVENOUS | Status: DC
  Administered 2018-08-08 – 2018-08-10 (×5): 2 g via INTRAVENOUS
  Filled 2018-08-08 (×7): qty 2

## 2018-08-08 MED ORDER — POTASSIUM CHLORIDE CRYS ER 20 MEQ PO TBCR
40.0000 meq | EXTENDED_RELEASE_TABLET | Freq: Once | ORAL | Status: AC
  Administered 2018-08-08: 40 meq via ORAL
  Filled 2018-08-08: qty 2

## 2018-08-08 MED ORDER — ONDANSETRON HCL 4 MG PO TABS: 4.0000 mg | ORAL_TABLET | Freq: Four times a day (QID) | ORAL | Status: DC | PRN

## 2018-08-08 NOTE — ED Provider Notes (Signed)
[facility] EMERGENCY DEPARTMENT Provider Note   CSN: [id number] Arrival date & time: 08/08/18  1007   History   Chief Complaint Chief Complaint  Patient presents with  . Nausea  . Memory Loss    HPI Charles Marquez is a 62 y.o. male.  HPI  Patient with history of shunt placement for hydrocephalus on 9/23 presents c/o return of symptoms from prior to shunt including gait instability and short term memory loss w/ mental confusion.  Says he was feeling well until this morning when he started having some difficulty with his gait and short term memory (didn't remember family placing items on the table) although short term memory was intact.  He had some minor arm coordination issues and said he felt he wasn't processing things at full speed.  He denied visual changes/chest pain (has a loop recorder)/sob/incontinence/slurred speech.   Family reports they have been texting with neurosurgeon (Dr. Dutch Quint) and that he is aware patient is in ED.   He denies febriles symptoms.  Last time patient ate was last night.   Past Medical History:  Diagnosis Date  . Anxiety   . Hypercholesteremia   . Stroke (HCC)    tia's  . TIA (transient ischemic attack)    09.15    Patient Active Problem List   Diagnosis Date Noted  . TIA (transient ischemic attack) 01/29/2017  . Acute encephalopathy   . Shunt malfunction 03/13/2016  . 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 Hyperlipidemia 10/20/2014  . Degenerative disc disease, lumbar 04/15/2013  . Routine general medical examination at a health care facility 07/23/2012  . DISTURBANCE OF SKIN SENSATION 10/05/2008  . HYPERLIPIDEMIA 01/01/2008  . MYCOPLASMA PNEUMONIA 01/01/2008    Past Surgical History:  Procedure Laterality Date  . Fractured arm Left 12  . HERNIA REPAIR Right 3/12  . LOOP RECORDER INSERTION N/A 04/10/2017   Procedure: Loop Recorder Insertion;  Surgeon:  Hillis Range, MD;  Location: [facility] INVASIVE CV LAB;  Service: Cardiovascular;  Laterality: N/A;  . SHUNT REMOVAL Right 03/13/2016   Procedure: SHUNT REMOVAL;  Surgeon: Julio Sicks, MD;  Location: [facility] NEURO ORS;  Service: Neurosurgery;  Laterality: Right;  . SHUNT REVISION Right 08/05/2018   Procedure: SHUNT REVISION;  Surgeon: Julio Sicks, MD;  Location: [facility] OR;  Service: Neurosurgery;  Laterality: Right;  [name] VASECTOMY  10/02/1997  . 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 VENTRICULOPERITONEAL SHUNT Right 07/22/2018   Procedure: Shunt Placment right occipital;  Surgeon: Julio Sicks, MD;  Location: [facility] OR;  Service: Neurosurgery;  Laterality: Right;        Home Medications    Prior to Admission medications   Medication Sig Start Date End Date Taking? Authorizing Provider  acetaminophen (TYLENOL) 500 MG tablet Take 500-1,000 mg by mouth every 6 (six) hours as needed for headache (pain).   Yes [provider]  atorvastatin (LIPITOR) 40 MG tablet Take 1 tablet (40 mg total) by mouth daily. 11/19/14  Yes Roderick Pee, MD  cetirizine (ZYRTEC) 10 MG tablet Take 10 mg by mouth daily as needed (seasonal allergies).    Yes [provider]  clopidogrel (PLAVIX) 75 MG tablet Take 1 tablet (75 mg total) by mouth daily. 01/14/15  Yes Roderick Pee,  MD  Multiple Vitamins-Minerals (OCUVITE EYE HEALTH FORMULA) CAPS Take 1 capsule by mouth daily.   Yes [provider]  ondansetron (ZOFRAN) 4 MG tablet Take 4 mg by mouth 4 (four) times daily as needed for nausea or vomiting.   Yes [provider]  OVER THE COUNTER MEDICATION Take 1 tablet by mouth once. dramamine   Yes [provider]  OVER THE COUNTER MEDICATION Take 1 tablet by mouth daily as needed (congestion). Over the  counter sinus medication   Yes [provider]  sodium chloride (OCEAN) 0.65 % SOLN nasal spray Place 1 spray into both nostrils as needed for congestion.   Yes [provider]  traMADol (ULTRAM) 50 MG tablet Take 1 tablet (50 mg total) by mouth every 6 (six) hours as needed. Patient taking differently: Take 50 mg by mouth at bedtime as needed (pain).  07/23/18  Yes Julio Sicks, MD    Family History Family History  Problem Relation Age of Onset  . COPD Mother   . Lung cancer Father   . Alzheimer's disease Father     Social History Social History   Tobacco Use  . Smoking status: Never Smoker  . Smokeless tobacco: Never Used  Substance Use Topics  . Alcohol use: Yes    Alcohol/week: 0.0 standard drinks    Comment: ocassionally  . Drug use: No     Allergies   Patient has no known allergies.   Review of Systems Review of Systems  Constitutional: Negative for appetite change, chills, diaphoresis and fatigue.  HENT: Negative for congestion, drooling, ear pain, sore throat and trouble swallowing.   Eyes: Negative for photophobia, pain and visual disturbance.  Respiratory: Negative for cough, chest tightness and shortness of breath.   Cardiovascular: Negative for chest pain and palpitations.  Gastrointestinal: Negative for abdominal pain.  Genitourinary: Negative for dysuria.  Musculoskeletal: Positive for neck stiffness.  Skin: Negative for wound.  Neurological: Positive for tremors and weakness. Negative for seizures, syncope, facial asymmetry, speech difficulty and numbness.  Psychiatric/Behavioral: Positive for confusion. Negative for agitation and behavioral problems.     Physical Exam Updated Vital Signs BP 134/79   Pulse 70   Temp 99.2 F (37.3 C) (Oral)   Resp 18   Ht 5\' 11"  (1.803 m)   Wt 86.2 kg   SpO2 97%   BMI 26.50 kg/m   Physical Exam  Constitutional: He appears well-developed and well-nourished.  HENT:  Head: Normocephalic and  atraumatic.  Mouth/Throat: Oropharynx is clear and moist.  Eyes: Conjunctivae and EOM are normal. Right eye exhibits no discharge. Left eye exhibits no discharge. No scleral icterus.  Neck: Neck supple. No tracheal deviation present.  Abdominal: Soft. There is no rebound and no guarding.  Lymphadenopathy:    He has no cervical adenopathy.  Neurological: He is alert. No cranial nerve deficit or sensory deficit. He exhibits normal muscle tone. Coordination abnormal.  Dysmetria bilaterally, some intermittent short term memory loss  Skin: Skin is warm. Capillary refill takes less than 2 seconds. No rash noted. He is not diaphoretic.  Psychiatric: He has a normal mood and affect. His behavior is normal. Judgment and thought content normal.     ED Treatments / Results  Labs (all labs ordered are listed, but only abnormal results are displayed) Labs Reviewed  COMPREHENSIVE METABOLIC PANEL - Abnormal; Notable for the following components:      Result Value   Sodium 132 (*)    Potassium 3.1 (*)  Chloride 96 (*)    Glucose, Bld 111 (*)    Calcium 8.0 (*)    Total Protein 5.8 (*)    Albumin 2.8 (*)    Total Bilirubin 1.3 (*)    All other components within normal limits  CBC WITH DIFFERENTIAL/PLATELET - Abnormal; Notable for the following components:   WBC 12.9 (*)    RBC 3.70 (*)    Hemoglobin 11.6 (*)    HCT 35.1 (*)    Neutro Abs 11.1 (*)    All other components within normal limits  URINALYSIS, ROUTINE W REFLEX MICROSCOPIC - Abnormal; Notable for the following components:   Color, Urine AMBER (*)    APPearance CLOUDY (*)    Ketones, ur 5 (*)    All other components within normal limits  CSF CELL COUNT WITH DIFFERENTIAL - Abnormal; Notable for the following components:   Color, CSF RED (*)    Appearance, CSF TURBID (*)    Segmented Neutrophils-CSF 97 (*)    Lymphs, CSF 2 (*)    Monocyte-Macrophage-Spinal Fluid 1 (*)    All other components within normal limits  CSF CULTURE    CULTURE, BLOOD (ROUTINE X 2)  CULTURE, BLOOD (ROUTINE X 2)  URINE CULTURE  I-STAT CG4 LACTIC ACID, ED  TYPE AND SCREEN    EKG EKG Interpretation  Date/Time:  Thursday August 08 2018 10:53:22 EDT Ventricular Rate:  66 PR Interval:    QRS Duration: 89 QT Interval:  394 QTC Calculation: 413 R Axis:   16 Text Interpretation:  Sinus rhythm Atrial premature complex Borderline repolarization abnormality Baseline wander in lead(s) II III aVF No significant change since last tracing Confirmed by Frederick Peers [phone]) on 08/08/2018 11:06:00 AM   Radiology Ct Head Wo Contrast  Result Date: 08/08/2018 CLINICAL DATA:  Concern for shunt malfunction. Headaches with nausea. Recent VP shunt revision. EXAM: CT HEAD WITHOUT CONTRAST TECHNIQUE: Contiguous axial images were obtained from the base of the skull through the vertex without intravenous contrast. COMPARISON:  Preoperative study 07/19/2018. Initial postoperative study 08/04/2018 following which VP shunt was revised. FINDINGS: Brain: RIGHT parietal ventriculoperitoneal shunt crosses the midline, and lies within dilated LEFT lateral ventricle. Both lateral ventricles are increased in size, and there is transependymal absorption biventricular diameter as seen on coronal imaging is 45 mm, which is decreased from 08/04/18, but similar to 07/19/2018. no extra-axial fluid collection. Small amount of parenchymal edema/CSF along the course of the catheter, unchanged. Vascular: Calcification of the cavernous internal carotid arteries consistent with cerebrovascular atherosclerotic disease. No signs of intracranial large vessel occlusion. Skull: Calvarium intact except for burr hole. Sinuses/Orbits: No layering sinus fluid.  Negative orbits. Other: None. IMPRESSION: Signs of persistent hydrocephalus, although improved from 08/04/2018. Correlate clinically for shunt failure versus slowly resolving hydrocephalus. Electronically Signed   By: Elsie Stain M.D.    On: 08/08/2018 11:31   Ct Chest Wo Contrast  Result Date: 08/08/2018 CLINICAL DATA:  New left basilar atelectasis EXAM: CT CHEST WITHOUT CONTRAST TECHNIQUE: Multidetector CT imaging of the chest was performed following the standard protocol without IV contrast. COMPARISON:  Chest x-ray from earlier in the same day. FINDINGS: Cardiovascular: Atherosclerotic calcifications of the thoracic aorta are noted. No aneurysmal dilatation is seen. No cardiac enlargement is noted. Mild coronary calcifications are seen. Mediastinum/Nodes: Thoracic inlet is within normal limits. No hilar or mediastinal adenopathy is noted. The esophagus is within normal limits. Lungs/Pleura: Lungs are well aerated bilaterally. No focal parenchymal nodules are seen. Mild atelectasis/infiltrate is noted in  the left lung base laterally. This is similar to that seen on recent chest x-ray. Upper Abdomen: Ventricular peritoneal shunt catheter is noted with the tip in the left upper quadrant. Some soft tissue density is noted adjacent to the catheter tip which may be related to some scarring/fluid. Musculoskeletal: Degenerative changes of the thoracic spine are noted. IMPRESSION: Mild left basilar atelectasis/infiltrates similar to that seen on recent plain film. Ventriculoperitoneal shunt catheter with tip in the left upper quadrant. Soft tissue density is noted adjacent to the catheter tip which may be related to scarring/fluid. Aortic Atherosclerosis (ICD10-I70.0). Electronically Signed   By: Alcide Clever M.D.   On: 08/08/2018 14:53   Dg Chest Portable 1 View  Result Date: 08/08/2018 CLINICAL DATA:  Fevers, history of recent shunt replacement EXAM: PORTABLE CHEST 1 VIEW COMPARISON:  08/04/2018 FINDINGS: Cardiac shadow is prominent but accentuated by the portable technique. The lungs are well aerated bilaterally. New left basilar atelectasis is noted. Loop recorder is noted. Shunt catheter is noted on the right stable from the prior exam.  IMPRESSION: New left basilar atelectasis. This may correspond with the patient's given clinical history. Electronically Signed   By: Alcide Clever M.D.   On: 08/08/2018 11:22    Procedures Procedures (including critical care time)  Medications Ordered in ED Medications  potassium chloride SA (K-DUR,KLOR-CON) CR tablet 40 mEq (has no administration in time range)  ceFEPIme (MAXIPIME) 2 g in sodium chloride 0.9 % 100 mL IVPB (has no administration in time range)  vancomycin (VANCOCIN) 1,500 mg in sodium chloride 0.9 % 500 mL IVPB (has no administration in time range)     Initial Impression / Assessment and Plan / ED Course  I have reviewed the triage vital signs and the nursing notes.  Pertinent labs & imaging results that were available during my care of the patient were reviewed by me and considered in my medical decision making (see chart for details).   Spoke on phone with Dr. Dutch Quint (neurosurgery) and they requested head CT w/o contrast and agreed with sepsis protocol although since patient is so well appearing, we will hold on antibiotics until CT results in hopes of culturing CSF sample in OR.   Discussed plan with Dr. Little/family/patient's nurse.  Held abx initially at request of neurosurgeon as patient was not toxic appearing, s/p CSF results plan was not for immediate externalization of shunt.  Surgeon asks for patient to be admitted for IV antibiotics and monitoring, neurosurgery will follow and re-evaluate in AM.  They make no specific antibiotic request for meningitis as CSF results are unremarkable at this point and we are treating for HAP, will start vanc/cefepime and consult for admission.  Family/patient notified and agree with plan.   Final Clinical Impressions(s) / ED Diagnoses   Final diagnoses:  HAP (hospital-acquired pneumonia)    ED Discharge Orders    None       Marthenia Rolling, DO 08/08/18 1553    Little, Ambrose Finland, MD 08/10/18 1755

## 2018-08-08 NOTE — Progress Notes (Addendum)
Subjective: Patient reports feelings of nausea and general malaise for two days. His wife and daughter, who are at bedside, report the patient has been acting confused and has had poor short term memory. They also report gait instability. Mrs. Maziarz expresses concern that the patient's shunt may be malfunctioning or that he has an infection.  Objective: Vital signs in last 24 hours: Temp:  [102 F (38.9 C)] 102 F (38.9 C) (09/26 1036) Pulse Rate:  [63] 63 (09/26 1036) Resp:  [20] 20 (09/26 1036) BP: (135)/(76) 135/76 (09/26 1036) SpO2:  [97 %-100 %] 97 % (09/26 1036) Weight:  [86.2 kg] 86.2 kg (09/26 1115)  Intake/Output from previous day: No intake/output data recorded. Intake/Output this shift: No intake/output data recorded.  Neurologic: Mental status: Alert, oriented, thought content appropriate Motor: Normal Wound:  Incision site at head is closed with staples. There is some dried blood at the site, but no erythema, drainage, or increased tenderness. Incision site at abdomen is clean, dry, and intact.   Lab Results: Recent Labs    08/08/18 1040  WBC 12.9*  HGB 11.6*  HCT 35.1*  PLT 178   BMET Recent Labs    08/08/18 1040  NA 132*  K 3.1*  CL 96*  CO2 28  GLUCOSE 111*  BUN 14  CREATININE 0.95  CALCIUM 8.0*    Studies/Results: Ct Head Wo Contrast  Result Date: 08/08/2018 CLINICAL DATA:  Concern for shunt malfunction. Headaches with nausea. Recent VP shunt revision. EXAM: CT HEAD WITHOUT CONTRAST TECHNIQUE: Contiguous axial images were obtained from the base of the skull through the vertex without intravenous contrast. COMPARISON:  Preoperative study 07/19/2018. Initial postoperative study 08/04/2018 following which VP shunt was revised. FINDINGS: Brain: RIGHT parietal ventriculoperitoneal shunt crosses the midline, and lies within dilated LEFT lateral ventricle. Both lateral ventricles are increased in size, and there is transependymal absorption biventricular  diameter as seen on coronal imaging is 45 mm, which is decreased from 08/04/18, but similar to 07/19/2018. no extra-axial fluid collection. Small amount of parenchymal edema/CSF along the course of the catheter, unchanged. Vascular: Calcification of the cavernous internal carotid arteries consistent with cerebrovascular atherosclerotic disease. No signs of intracranial large vessel occlusion. Skull: Calvarium intact except for burr hole. Sinuses/Orbits: No layering sinus fluid.  Negative orbits. Other: None. IMPRESSION: Signs of persistent hydrocephalus, although improved from 08/04/2018. Correlate clinically for shunt failure versus slowly resolving hydrocephalus. Electronically Signed   By: Elsie Stain M.D.   On: 08/08/2018 11:31   Dg Chest Portable 1 View  Result Date: 08/08/2018 CLINICAL DATA:  Fevers, history of recent shunt replacement EXAM: PORTABLE CHEST 1 VIEW COMPARISON:  08/04/2018 FINDINGS: Cardiac shadow is prominent but accentuated by the portable technique. The lungs are well aerated bilaterally. New left basilar atelectasis is noted. Loop recorder is noted. Shunt catheter is noted on the right stable from the prior exam. IMPRESSION: New left basilar atelectasis. This may correspond with the patient's given clinical history. Electronically Signed   By: Alcide Clever M.D.   On: 08/08/2018 11:22    Assessment/Plan: Patient s/p shunt revision four days ago for hydrocephalus. He is oriented to person, place, and time. Scan from today shows improvement in his lateral ventricles bilaterally compared to his scan on 08/05/2018. Blood culture, urine culture, UA, CMP, CBC, and lactic acid have all been ordered by emergency medicine. Dr. Jordan Likes has collected a CSF specimen for culture/gram stain and cell count with differential. Dr. Jordan Likes has discussed the plan with the patient and  his family and they are agreeable.   LOS: 0 days    Floreen Comber 08/08/2018, 12:24 PM

## 2018-08-08 NOTE — Progress Notes (Signed)
Paged by nursing for [name] having double vision and his family concerned because of previous TIAs. On visit patient's wife and daughter were at bedside. He stated his symptoms had resolved and he was trying to sleep. Patient is A&Ox3 but does not remember being in the ED earlier today. On physical exam CN II-XII are intact, EOM intact with right gaze nystagmus, negative dysmetria. His symptoms are likely secondary to his earlier fever of 103 and exhaustion. He had a CT scan earlier today which showed improvement in his hydrocephalus. His symptoms appear to be unlikely related to a TIA, and we would not be able to anticoagulate him at this time due to recent surgery. We discussed this with family and asked the patient to let us know if he or the family noticed any other changes throughout the night. They stated they understood and were agreeable.   Guinevere Scarlet A, DO 08/08/2018, 11:47 PM Pager: [phone]

## 2018-08-08 NOTE — Anesthesia Preprocedure Evaluation (Addendum)
Anesthesia Evaluation  Patient identified by MRN, date of birth, ID band Patient awake    Reviewed: Allergy & Precautions, NPO status , Patient's Chart, lab work & pertinent test results  Airway Mallampati: II  TM Distance: >3 FB Neck ROM: Full    Dental no notable dental hx.    Pulmonary neg pulmonary ROS,    Pulmonary exam normal breath sounds clear to auscultation       Cardiovascular negative cardio ROS Normal cardiovascular exam Rhythm:Regular Rate:Normal  TTE 01/2017 EF 60%, no valvular abnormalities  Loop Recorder Interrogation 06/2018 Battery status OK. Normal device function. No new symptom episodes, tachy episodes, brady, or pause episodes. No new AF episodes.   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 on 9/23 presenting to the ED for return of memory loss and gait instability  Reproductive/Obstetrics                          Anesthesia Physical Anesthesia Plan  ASA: III  Anesthesia Plan: General   Post-op Pain Management:    Induction: Intravenous  PONV Risk Score and Plan: 2 and Dexamethasone, Ondansetron and Midazolam  Airway Management Planned: Oral ETT  Additional Equipment:   Intra-op Plan:   Post-operative Plan: Extubation in OR  Informed Consent: I have reviewed the patients History and Physical, chart, labs and discussed the procedure including the risks, benefits and alternatives for the proposed anesthesia with the patient or authorized representative who has indicated his/her understanding and acceptance.   Dental advisory given  Plan Discussed with:  CRNA and Surgeon  Anesthesia Plan Comments:        Anesthesia Quick Evaluation

## 2018-08-08 NOTE — ED Notes (Signed)
Patient transported to CT 

## 2018-08-08 NOTE — H&P (Signed)
Date: 08/08/2018               Patient Name:  Charles Marquez MRN: [id number]  DOB: 1956/06/22 Age / Sex: 62 y.o., male   PCP: Eartha Inch, MD         Medical Service: Internal Medicine Teaching Service         Attending Physician: Dr. Oswaldo Done, Marquita Palms, *    First Contact: Dr. Avie Arenas Pager: [phone]  Second Contact: Dr. 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 These are the same symptoms that initially led to the diagnosis of hydrocephalus. After his shunt placement, the symptoms improved, however they came back last week, and he subsequently underwent VP shunt revision for shunt malfunction on 9/23. When he was discharged from the hospital 2 days ago, his symptoms were improved and he was able to ambulate independently. Last night he began to experience chills and reports a measured fever to 102.2. This morning, his wife reports that he had an episode of nausea and vomiting. She also could not get him out of bed because he was so unsteady and kept falling backwards. He has had short-term memory problems since being diagnosed with hydrocephalus, but his memory was more impaired this morning and his wife reports that he's been hallucinating. For example, he thinks the light above his hospital bed is a computer and the pulse ox on his finger is a cell phone. His wife also states that he endorsed mild left flank pain yesterday, which they attributed either to a recent fall or to the end of the shunt. He has had a mild unproductive cough since last night. He endorses dizziness and generalized  weakness. He denies headaches, dyspnea, chest pain, abdominal pain, and dysuria.   Upon arrival to the ED, the patient was febrile to 102 with RR 20, HR 63, BP 135/76, and O2 97%. Labs significant for leukocytosis to 12.9, Hb 11.6 (14.8 9/22), Na 132, K 3.1, and lactate 1.15. UA unremarkable. Neurosurgery obtained a CSF sample through the shunt which could not be analyzed comprehensively due to clot. CSF gram culture was negative. CT head showed persistent hydrocephalus improved from 9/22 with periventricular edema consistent with post-surgical changes. CXR showed new left basilar atelectasis. Chest CT showed mild left basilar atelectasis/infiltrates. EKG was sinus rhythm without significant changes from prior or evidence of ischemia. He received cefepime and potassium supplementation in the ED.  Meds:  Current Meds  Medication Sig  . acetaminophen (TYLENOL) 500 MG tablet Take 500-1,000 mg by mouth every 6 (six) hours as needed for headache (pain).  [name] atorvastatin (LIPITOR) 40 MG tablet Take 1 tablet (40 mg total) by mouth daily.  . cetirizine (ZYRTEC) 10 MG tablet Take 10 mg by mouth daily as needed (seasonal allergies).   . clopidogrel (PLAVIX) 75 MG tablet Take 1 tablet (75 mg total) by mouth daily.  . Multiple Vitamins-Minerals (OCUVITE EYE HEALTH FORMULA) CAPS Take 1 capsule by mouth daily.  . ondansetron (ZOFRAN) 4 MG tablet Take 4 mg by mouth 4 (four) times daily as needed for nausea or vomiting.  [name] OVER THE  COUNTER MEDICATION Take 1 tablet by mouth once. dramamine  . OVER THE COUNTER MEDICATION Take 1 tablet by mouth daily as needed (congestion). Over the counter sinus medication  . sodium chloride (OCEAN) 0.65 % SOLN nasal spray Place 1 spray into both nostrils as needed for congestion.  . traMADol (ULTRAM) 50 MG tablet Take 1 tablet (50 mg total) by mouth every 6 (six) hours as needed. (Patient taking differently: Take 50 mg by mouth at bedtime as needed (pain). )   Allergies: Allergies  as of 08/08/2018  . (No Known Allergies)   Past Medical History:  Diagnosis Date  . Anxiety   . Hypercholesteremia   . Stroke (HCC)    tia's  . TIA (transient ischemic attack)    09.15   Past Surgical History:  Procedure Laterality Date  . Fractured arm Left 12  . HERNIA REPAIR Right 3/12  . LOOP RECORDER INSERTION N/A 04/10/2017   Procedure: Loop Recorder Insertion;  Surgeon: Hillis Range, MD;  Location: [facility] INVASIVE CV LAB;  Service: Cardiovascular;  Laterality: N/A;  . SHUNT REMOVAL Right 03/13/2016   Procedure: SHUNT REMOVAL;  Surgeon: Julio Sicks, MD;  Location: [facility] NEURO ORS;  Service: Neurosurgery;  Laterality: Right;  . SHUNT REVISION Right 08/05/2018   Procedure: SHUNT REVISION;  Surgeon: Julio Sicks, MD;  Location: [facility] OR;  Service: Neurosurgery;  Laterality: Right;  [name] VASECTOMY  10/02/1997  . 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 VENTRICULOPERITONEAL SHUNT Right 07/22/2018   Procedure: Shunt Placment right occipital;  Surgeon: Julio Sicks, MD;  Location: [facility] OR;  Service: Neurosurgery;  Laterality: Right;    Family History: No family history of CVA or hydrocephalus.  Social History: Patient lives at home with his wife. They have two adult children who live nearby. 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 He is a never smoker. He denies alcohol or illicit drug use.  Review of Systems: A complete ROS was negative except as per HPI.   Physical Exam: Blood pressure 138/81, pulse 65, temperature (!) 101.5 F (38.6 C), temperature source Oral, resp. rate 19, height 5\' 11"  (1.803 m), weight 86.2 kg, SpO2 96 %.  Constitutional: Well-developed and well-nourished. He initially appears comfortable, but later during the exam he has bouts of nausea and gagging. The  patient answers questions appropriately, but turns to his wife to help answer some questions about the events that brought him into the hospital. HENT: Right parietal incision with staples. The wound appears clean, dry, and intact without surrounding erythema. No nuchal rigidity. Neuro: Cranial nerves II-XII intact. Normal strength, sensation, and reflexes throughout. Dysmetria on finger-to-nose testing (does not reach all the way to the examiner's finger). Normal shin-to-heel. Cardiovascular: Normal rate and regular rhythm. No murmurs, rubs, or gallops. Pulmonary/Chest: Effort normal. Clear to auscultation bilaterally. No wheezes, rales, or rhonchi. Abdominal: Bowel sounds present. Soft, non-distended, non-tender. Ext: No lower extremity edema. Skin: Warm and dry. No rashes or wounds.  EKG: personally reviewed my interpretation is sinus rhythm without ischemic changes or changes from prior.  CXR: personally reviewed my interpretation is left lower lobe opacity.  Assessment & Plan by Problem: Active Problems:   Acute encephalopathy  Cully Luckow is a 62 yo male with a medical history of TIA and communicating hydrocephalus  s/p VP shunt placement (07/22/18) who presents with nausea, vomiting, unsteady gait, and memory impairment s/p shunt repair 08/05/18.  Acute Encephalopathy and Fever Nausea and Vomiting - Pt presents with fever to 102, leukocytosis, nausea, vomiting, confusion, and instability. He is hemodynamically stable and satting well on room air. His symptoms are similar to his previous episodes of hydrocephalus. CT shows hydrocephalus that has improved since her shunt revision, but is still present. The fever and leukocytosis are suspicious for infection. The patient was evaluated by neurosurgery who tapped the shunt. Neurosurgery does not think his presentation is related to shunt malfunction or infection. Neuro exam is normal. Another possible source of infection is pneumonia (mild  left-sided basilar atelectasis vs infiltrate on chest CT), however the patient has had only mild cough for one day. Will treat with broad-spectrum antibiotics to cover for meningitis/encephalitis vs pneumonia.  - Low concern for TIA or CVA given lack of focal neurological deficits in history or on exam. 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 He has a loop recorder which was placed to evaluate for arrhythmogenic causes of stroke, but he has had no documented arrhythmias. He is on plavix at home, but he has not taken this regularly since his surgery.  Plan - continue to hold home plavix    FEN:  NS with K at 75cc/hr, heart healthy diet, replace electrolytes as needed  DVT ppx: SCDs (lovenox contraindicated due to recent neurosurgery) Code status: FULL code  Dispo: Admit patient to Inpatient with expected length of stay greater than 2 midnights.  Signed: Dionne Ano, MD 08/08/2018, 5:21 PM  Pager: [phone]

## 2018-08-08 NOTE — ED Triage Notes (Signed)
Per family patient has hx of shunt replacement and is acting today like he has in the past prior to replacement. States patient has nausea and memory loss where his memory is intermittent.

## 2018-08-08 NOTE — Progress Notes (Signed)
Pharmacy Antibiotic Note  Charles Marquez is a 62 y.o. male admitted on 08/08/2018 with nausea and memory loss.  Pharmacy has been consulted for Vancomycin and cefepime dosing for treatment of pneumonia and CNS coverage. Tmax is 102 and WBC is elevated at 12.9. Scr and LA are WNL. Pt with recent shunt revision.   Plan: Vancomycin 1500mg  IV x 1 then 1gm IV Q8H Cefepime 2gm IV Q8H F/u renal fxn, C&S, clinical status and trough at SS  Height: 5\' 11"  (180.3 cm) Weight: 190 lb (86.2 kg) IBW/kg (Calculated) : 75.3  Temp (24hrs), Avg:101.1 F (38.4 C), Min:99.2 F (37.3 C), Max:102 F (38.9 C)  Recent Labs  Lab 08/04/18 1703 08/08/18 1040 08/08/18 1100  WBC 12.0* 12.9*  --   CREATININE 1.05 0.95  --   LATICACIDVEN  --   --  1.15    Estimated Creatinine Clearance: 88.1 mL/min (by C-G formula based on SCr of 0.95 mg/dL).    No Known Allergies  Antimicrobials this admission: Vanc 9/26>> Cefepime 9/26>>  Dose adjustments this admission: N/A  Microbiology results: Pending  Thank you for allowing pharmacy to be a part of this patient's care.  Hartley Urton, Drake Leach 08/08/2018 3:16 PM

## 2018-08-08 NOTE — Progress Notes (Signed)
Patient seen and examined in the emergency department.  Patient with worsening fever and confusion.  Patient presented to emergency department with temperature of 102.  White cell white blood cell count elevated at 12.6 with some degree of left shift.  Patient with recent shunt revision approximately 3 days ago without complication.  No seizure, no weakness, patient with some ongoing unsteadiness and some confusion.  Febrile to 102.  Appears nontoxic otherwise.  Wounds clean and dry.  Shunt pumps and refills easily.  Abdomen benign.  Neurologically he is awake and alert.  He is oriented and appropriate to me at this time.  Motor and sensory function intact.  Head CT scan demonstrates good placement of his occipital shunt catheter with good decompression of his lateral ventricles.  Patient still with some periventricular edema which will take some time to fully clear after shunt placement.  No evidence of hemorrhage or other complication.  VP shunt was aspirated at the bedside.  The tap was bloody in nature and the cell count was not usable however the Gram stain was negative for bacteria and there were scant white blood cells.  I do not believe that his situation is consistent with shunt infection.  Patient with atelectasis on his chest x-ray and situation is worrisome for possible pneumonia.  I recommend hospital admission to the medicine service with institution of antibiotics as appropriate for a possible hospital-acquired pneumonia.  We will follow along with the patient and follow-up his CSF cultures.

## 2018-08-09 ENCOUNTER — Inpatient Hospital Stay (HOSPITAL_COMMUNITY)
Admission: EM | Disposition: A | Discharge status: Home / Self Care | Payer: Self-pay | Source: Home / Self Care | Attending: Neurosurgery

## 2018-08-09 ENCOUNTER — Emergency Department (HOSPITAL_COMMUNITY): Payer: BLUE CROSS/BLUE SHIELD | Admitting: Certified Registered Nurse Anesthetist

## 2018-08-09 ENCOUNTER — Encounter (HOSPITAL_COMMUNITY): Payer: Self-pay

## 2018-08-09 DIAGNOSIS — T85730A Infection and inflammatory reaction due to ventricular intracranial (communicating) shunt, initial encounter: Secondary | ICD-10-CM | POA: Diagnosis present

## 2018-08-09 DIAGNOSIS — E876 Hypokalemia: Secondary | ICD-10-CM

## 2018-08-09 DIAGNOSIS — G009 Bacterial meningitis, unspecified: Secondary | ICD-10-CM | POA: Insufficient documentation

## 2018-08-09 HISTORY — PX: VENTRICULOSTOMY: SHX5377

## 2018-08-09 HISTORY — PX: SHUNT REMOVAL: SHX342

## 2018-08-09 LAB — CUP PACEART REMOTE DEVICE CHECK
Date Time Interrogation Session: 20190913013604
Implantable Pulse Generator Implant Date: 20180529

## 2018-08-09 LAB — URINE CULTURE: Culture: NO GROWTH

## 2018-08-09 LAB — COMPREHENSIVE METABOLIC PANEL
ALK PHOS: 45 U/L (ref 38–126)
ALT: 14 U/L (ref 0–44)
ANION GAP: 9 (ref 5–15)
AST: 17 U/L (ref 15–41)
Albumin: 2.7 g/dL — ABNORMAL LOW (ref 3.5–5.0)
BILIRUBIN TOTAL: 0.9 mg/dL (ref 0.3–1.2)
BUN: 15 mg/dL (ref 6–20)
CALCIUM: 7.9 mg/dL — AB (ref 8.9–10.3)
CO2: 27 mmol/L (ref 22–32)
CREATININE: 0.93 mg/dL (ref 0.61–1.24)
Chloride: 98 mmol/L (ref 98–111)
GFR calc Af Amer: >60 mL/min (ref >60)
GFR calc non Af Amer: >60 mL/min (ref >60)
Glucose, Bld: 106 mg/dL — ABNORMAL HIGH (ref 70–99)
Potassium: 3 mmol/L — ABNORMAL LOW (ref 3.5–5.1)
SODIUM: 134 mmol/L — AB (ref 135–145)
Total Protein: 6.3 g/dL — ABNORMAL LOW (ref 6.5–8.1)

## 2018-08-09 LAB — CBC
HCT: 35.7 % — ABNORMAL LOW (ref 39.0–52.0)
HEMOGLOBIN: 12.2 g/dL — AB (ref 13.0–17.0)
MCH: 31.8 pg (ref 26.0–34.0)
MCHC: 34.2 g/dL (ref 30.0–36.0)
MCV: 93 fL (ref 78.0–100.0)
Platelets: 156 10*3/uL (ref 150–400)
RBC: 3.84 MIL/uL — ABNORMAL LOW (ref 4.22–5.81)
RDW: 11.7 % (ref 11.5–15.5)
WBC: 10 10*3/uL (ref 4.0–10.5)

## 2018-08-09 LAB — SURGICAL PCR SCREEN
MRSA, PCR: NEGATIVE
Staphylococcus aureus: NEGATIVE

## 2018-08-09 LAB — PROCALCITONIN: Procalcitonin: 0.69 ng/mL

## 2018-08-09 SURGERY — SHUNT REMOVAL
Anesthesia: General | Laterality: Right

## 2018-08-09 MED ORDER — SUGAMMADEX SODIUM 200 MG/2ML IV SOLN
INTRAVENOUS | Status: DC | PRN
  Administered 2018-08-09: 200 mg via INTRAVENOUS

## 2018-08-09 MED ORDER — FENTANYL CITRATE (PF) 250 MCG/5ML IJ SOLN
INTRAMUSCULAR | Status: AC
  Filled 2018-08-09: qty 5

## 2018-08-09 MED ORDER — LIDOCAINE-EPINEPHRINE 1 %-1:100000 IJ SOLN
INTRAMUSCULAR | Status: DC | PRN
  Administered 2018-08-09: 3 mL

## 2018-08-09 MED ORDER — MIDAZOLAM HCL 2 MG/2ML IJ SOLN
INTRAMUSCULAR | Status: DC | PRN
  Administered 2018-08-09: 2 mg via INTRAVENOUS

## 2018-08-09 MED ORDER — MIDAZOLAM HCL 2 MG/2ML IJ SOLN
INTRAMUSCULAR | Status: AC
  Filled 2018-08-09: qty 2

## 2018-08-09 MED ORDER — LIDOCAINE 2% (20 MG/ML) 5 ML SYRINGE
INTRAMUSCULAR | Status: DC | PRN
  Administered 2018-08-09: 60 mg via INTRAVENOUS

## 2018-08-09 MED ORDER — ONDANSETRON HCL 4 MG PO TABS: 4.0000 mg | ORAL_TABLET | ORAL | Status: DC | PRN

## 2018-08-09 MED ORDER — FENTANYL CITRATE (PF) 100 MCG/2ML IJ SOLN
INTRAMUSCULAR | Status: DC | PRN
  Administered 2018-08-09 (×2): 50 ug via INTRAVENOUS

## 2018-08-09 MED ORDER — DEXAMETHASONE SODIUM PHOSPHATE 4 MG/ML IJ SOLN
INTRAMUSCULAR | Status: DC | PRN
  Administered 2018-08-09: 4 mg via INTRAVENOUS

## 2018-08-09 MED ORDER — HYDROMORPHONE HCL 1 MG/ML IJ SOLN: 0.2500 mg | INTRAMUSCULAR | Status: DC | PRN

## 2018-08-09 MED ORDER — ROCURONIUM BROMIDE 10 MG/ML (PF) SYRINGE
PREFILLED_SYRINGE | INTRAVENOUS | Status: DC | PRN
  Administered 2018-08-09: 50 mg via INTRAVENOUS

## 2018-08-09 MED ORDER — VANCOMYCIN HCL IN DEXTROSE 1-5 GM/200ML-% IV SOLN
INTRAVENOUS | Status: AC
  Filled 2018-08-09: qty 200

## 2018-08-09 MED ORDER — POTASSIUM CHLORIDE CRYS ER 20 MEQ PO TBCR
40.0000 meq | EXTENDED_RELEASE_TABLET | Freq: Four times a day (QID) | ORAL | Status: AC
  Administered 2018-08-09: 40 meq via ORAL
  Filled 2018-08-09: qty 2

## 2018-08-09 MED ORDER — BUPIVACAINE HCL (PF) 0.5 % IJ SOLN
INTRAMUSCULAR | Status: AC
  Filled 2018-08-09: qty 30

## 2018-08-09 MED ORDER — PROPOFOL 10 MG/ML IV BOLUS
INTRAVENOUS | Status: AC
  Filled 2018-08-09: qty 20

## 2018-08-09 MED ORDER — BUPIVACAINE HCL (PF) 0.5 % IJ SOLN
INTRAMUSCULAR | Status: DC | PRN
  Administered 2018-08-09: 3 mL

## 2018-08-09 MED ORDER — LIDOCAINE-EPINEPHRINE 1 %-1:100000 IJ SOLN
INTRAMUSCULAR | Status: AC
  Filled 2018-08-09: qty 1

## 2018-08-09 MED ORDER — ONDANSETRON HCL 4 MG/2ML IJ SOLN
4.0000 mg | INTRAMUSCULAR | Status: DC | PRN
  Administered 2018-08-22: 4 mg via INTRAVENOUS
  Filled 2018-08-09: qty 2

## 2018-08-09 MED ORDER — PROPOFOL 10 MG/ML IV BOLUS
INTRAVENOUS | Status: DC | PRN
  Administered 2018-08-09: 120 mg via INTRAVENOUS

## 2018-08-09 MED ORDER — PROMETHAZINE HCL 25 MG/ML IJ SOLN: 6.2500 mg | INTRAMUSCULAR | Status: DC | PRN

## 2018-08-09 MED ORDER — PHENYLEPHRINE 40 MCG/ML (10ML) SYRINGE FOR IV PUSH (FOR BLOOD PRESSURE SUPPORT)
PREFILLED_SYRINGE | INTRAVENOUS | Status: DC | PRN
  Administered 2018-08-09 (×2): 80 ug via INTRAVENOUS

## 2018-08-09 MED ORDER — LABETALOL HCL 5 MG/ML IV SOLN
10.0000 mg | INTRAVENOUS | Status: DC | PRN
  Filled 2018-08-09: qty 4

## 2018-08-09 MED ORDER — SODIUM CHLORIDE 0.9 % IV SOLN
INTRAVENOUS | Status: DC
  Administered 2018-08-09 – 2018-08-18 (×2): via INTRAVENOUS

## 2018-08-09 MED ORDER — THROMBIN 5000 UNITS EX SOLR
CUTANEOUS | Status: AC
  Filled 2018-08-09: qty 10000

## 2018-08-09 MED ORDER — 0.9 % SODIUM CHLORIDE (POUR BTL) OPTIME
TOPICAL | Status: DC | PRN
  Administered 2018-08-09: 1000 mL

## 2018-08-09 MED ORDER — SODIUM CHLORIDE 0.9 % IV SOLN
INTRAVENOUS | Status: DC | PRN
  Administered 2018-08-09: 14:00:00

## 2018-08-09 MED ORDER — LEVETIRACETAM IN NACL 500 MG/100ML IV SOLN
500.0000 mg | Freq: Two times a day (BID) | INTRAVENOUS | Status: DC
  Administered 2018-08-09 – 2018-08-22 (×25): 500 mg via INTRAVENOUS
  Filled 2018-08-09 (×25): qty 100

## 2018-08-09 MED ORDER — VANCOMYCIN HCL 1000 MG IV SOLR
INTRAVENOUS | Status: DC | PRN
  Administered 2018-08-09: 1000 mg via INTRAVENOUS

## 2018-08-09 MED ORDER — BACITRACIN ZINC 500 UNIT/GM EX OINT
TOPICAL_OINTMENT | CUTANEOUS | Status: AC
  Filled 2018-08-09: qty 28.35

## 2018-08-09 MED ORDER — LACTATED RINGERS IV SOLN
INTRAVENOUS | Status: DC | PRN
  Administered 2018-08-09: 15:00:00 via INTRAVENOUS

## 2018-08-09 MED ORDER — PROMETHAZINE HCL 25 MG PO TABS: 12.5000 mg | ORAL_TABLET | ORAL | Status: DC | PRN

## 2018-08-09 MED ORDER — SODIUM CHLORIDE 0.9 % IV SOLN
INTRAVENOUS | Status: DC
  Administered 2018-08-09 – 2018-08-14 (×4): via INTRAVENOUS

## 2018-08-09 SURGICAL SUPPLY — 71 items
BLADE CLIPPER SURG (BLADE) ×6 IMPLANT
BLADE SURG 15 STRL LF DISP TIS (BLADE) ×1 IMPLANT
BLADE SURG 15 STRL SS (BLADE) ×2
BOOT SUTURE AID YELLOW STND (SUTURE) IMPLANT
BUR ACORN 6.0 PRECISION (BURR) ×2 IMPLANT
BUR ACORN 6.0MM PRECISION (BURR) ×1
CABLE BIPOLOR RESECTION CORD (MISCELLANEOUS) ×3 IMPLANT
CANISTER SUCT 3000ML PPV (MISCELLANEOUS) ×6 IMPLANT
CARTRIDGE OIL MAESTRO DRILL (MISCELLANEOUS) ×1 IMPLANT
CATH VENTRIC 35X38 W/TROCAR LG (CATHETERS) ×3 IMPLANT
CLIP RANEY DISP (INSTRUMENTS) IMPLANT
CLOSURE WOUND 1/2 X4 (GAUZE/BANDAGES/DRESSINGS)
DECANTER SPIKE VIAL GLASS SM (MISCELLANEOUS) ×6 IMPLANT
DIFFUSER DRILL AIR PNEUMATIC (MISCELLANEOUS) ×3 IMPLANT
DRAIN BAG CSF ACCUDRAIN (MISCELLANEOUS) ×3 IMPLANT
DRAPE HALF SHEET 40X57 (DRAPES) ×3 IMPLANT
DRAPE ORTHO SPLIT 77X108 STRL (DRAPES) ×2
DRAPE POUCH INSTRU U-SHP 10X18 (DRAPES) IMPLANT
DRAPE SURG ORHT 6 SPLT 77X108 (DRAPES) ×1 IMPLANT
DRSG OPSITE POSTOP 3X4 (GAUZE/BANDAGES/DRESSINGS) ×3 IMPLANT
DURAPREP 26ML APPLICATOR (WOUND CARE) ×6 IMPLANT
DURAPREP 6ML APPLICATOR 50/CS (WOUND CARE) ×3 IMPLANT
ELECT CAUTERY BLADE 6.4 (BLADE) IMPLANT
ELECT REM PT RETURN 9FT ADLT (ELECTROSURGICAL) ×6
ELECTRODE REM PT RTRN 9FT ADLT (ELECTROSURGICAL) ×2 IMPLANT
GAUZE 4X4 16PLY RFD (DISPOSABLE) ×3 IMPLANT
GAUZE SPONGE 4X4 12PLY STRL (GAUZE/BANDAGES/DRESSINGS) IMPLANT
GLOVE BIO SURGEON STRL SZ7 (GLOVE) IMPLANT
GLOVE BIOGEL PI IND STRL 7.0 (GLOVE) IMPLANT
GLOVE BIOGEL PI INDICATOR 7.0 (GLOVE)
GLOVE ECLIPSE 7.0 STRL STRAW (GLOVE) ×3 IMPLANT
GLOVE EXAM NITRILE LRG STRL (GLOVE) IMPLANT
GLOVE EXAM NITRILE XL STR (GLOVE) IMPLANT
GLOVE EXAM NITRILE XS STR PU (GLOVE) IMPLANT
GLOVE INDICATOR 7.5 STRL GRN (GLOVE) IMPLANT
GOWN STRL REUS W/ TWL LRG LVL3 (GOWN DISPOSABLE) ×3 IMPLANT
GOWN STRL REUS W/ TWL XL LVL3 (GOWN DISPOSABLE) IMPLANT
GOWN STRL REUS W/TWL 2XL LVL3 (GOWN DISPOSABLE) IMPLANT
GOWN STRL REUS W/TWL LRG LVL3 (GOWN DISPOSABLE) ×6
GOWN STRL REUS W/TWL XL LVL3 (GOWN DISPOSABLE)
HEMOSTAT SURGICEL 2X14 (HEMOSTASIS) IMPLANT
KIT BASIN OR (CUSTOM PROCEDURE TRAY) ×3 IMPLANT
KIT TURNOVER KIT B (KITS) ×6 IMPLANT
NEEDLE HYPO 18GX1.5 BLUNT FILL (NEEDLE) IMPLANT
NEEDLE HYPO 25X1 1.5 SAFETY (NEEDLE) ×6 IMPLANT
NS IRRIG 1000ML POUR BTL (IV SOLUTION) ×6 IMPLANT
OIL CARTRIDGE MAESTRO DRILL (MISCELLANEOUS) ×3
PACK EENT II TURBAN DRAPE (CUSTOM PROCEDURE TRAY) ×3 IMPLANT
PACK LAMINECTOMY NEURO (CUSTOM PROCEDURE TRAY) ×3 IMPLANT
PAD ARMBOARD 7.5X6 YLW CONV (MISCELLANEOUS) ×12 IMPLANT
PENCIL BUTTON HOLSTER BLD 10FT (ELECTRODE) IMPLANT
SET POST CRANIOTOMY SUBDURAL (MISCELLANEOUS) IMPLANT
SPONGE SURGIFOAM ABS GEL 12-7 (HEMOSTASIS) IMPLANT
STAPLER SKIN PROX WIDE 3.9 (STAPLE) ×3 IMPLANT
STAPLER VISISTAT 35W (STAPLE) ×3 IMPLANT
STRIP CLOSURE SKIN 1/2X4 (GAUZE/BANDAGES/DRESSINGS) IMPLANT
SUT ETHILON 3 0 PS 1 (SUTURE) ×3 IMPLANT
SUT SILK 2 0 PERMA HAND 18 BK (SUTURE) ×3 IMPLANT
SUT SILK 2 0 TIES 10X30 (SUTURE) ×3 IMPLANT
SUT VIC AB 2-0 CT2 18 VCP726D (SUTURE) ×3 IMPLANT
SUT VIC AB 3-0 SH 8-18 (SUTURE) ×3 IMPLANT
SYR BULB 3OZ (MISCELLANEOUS) ×3 IMPLANT
SYR CONTROL 10ML LL (SYRINGE) ×3 IMPLANT
TOWEL GREEN STERILE (TOWEL DISPOSABLE) ×6 IMPLANT
TOWEL GREEN STERILE FF (TOWEL DISPOSABLE) ×6 IMPLANT
TRAY FOLEY MTR SLVR 16FR STAT (SET/KITS/TRAYS/PACK) IMPLANT
TUBE CONNECTING 12'X1/4 (SUCTIONS)
TUBE CONNECTING 12X1/4 (SUCTIONS) IMPLANT
UNDERPAD 30X30 (UNDERPADS AND DIAPERS) ×3 IMPLANT
WATER STERILE IRR 1000ML POUR (IV SOLUTION) ×6 IMPLANT
hermetric large style ventricular catheter set ×3 IMPLANT

## 2018-08-09 NOTE — Progress Notes (Addendum)
  NEUROSURGERY PROGRESS NOTE   Noted episode of blurry vision late last night that resolved. No further episodes. Wife at bedside states he continues to have episodic hallucinations and confusion. He currently feels well and is without concerns today. Denies HA, dizziness, further changes in vision, focal deficits.  EXAM:  BP (!) 158/94 (BP Location: Left Arm)   Pulse 67   Temp (!) 102.5 F (39.2 C) (Oral)   Resp (!) 21   Ht 5\' 11"  (1.803 m)   Wt 86.2 kg   SpO2 96%   BMI 26.50 kg/m   Awake, alert, oriented Speech fluent, appropriate  CN grossly intact  5/5 BUE/BLE [-] drift Incision: c/d/i, no redness, warmth, tenderness, drainage  IMPRESSION/PLAN 62 y.o. male s/p right occipital VP shunt revision by Dr Jordan Likes on 9/23. Returned to ER yesterday due to confusion and fever. Shunt tapped by Dr Jordan Likes for CSF cell count, gram stain and cultures. Being treated currently with Cefepime and Vanc for questionable HAP. Remains neurologically intact with intermittent confusion/hallucinations. - Gram stain yesterday negative for bacteria - CSF cultures still pending. Will await final culture results - Continue treatment for ?HAP per medical service. No new NS recs   Addendum CSF cultures + Klebsiella Patient to undergo proximal shunt removal with placement of EVD today Risks, benefits alternatives discussed. Pt states understanding and wishes to proceed. Consent signed.

## 2018-08-09 NOTE — Transfer of Care (Signed)
Immediate Anesthesia Transfer of Care Note  Patient: Charles Marquez  Procedure(s) Performed: SHUNT REMOVAL With Placement of Ventricular Catheter (Right ) VENTRICULOSTOMY (Right )  Patient Location: PACU  Anesthesia Type:General  Level of Consciousness: awake, drowsy and patient cooperative  Airway & Oxygen Therapy: Patient Spontanous Breathing and Patient connected to nasal cannula oxygen  Post-op Assessment: Report given to RN and Post -op Vital signs reviewed and stable  Post vital signs: Reviewed and stable  Last Vitals:  Vitals Value Taken Time  BP 133/82 08/09/2018  4:34 PM  Temp    Pulse 84 08/09/2018  4:39 PM  Resp 17 08/09/2018  4:39 PM  SpO2 100 % 08/09/2018  4:39 PM  Vitals shown include unvalidated device data.  Last Pain:  Vitals:   08/09/18 0823  TempSrc: Oral  PainSc:          Complications: No apparent anesthesia complications

## 2018-08-09 NOTE — Progress Notes (Addendum)
Initial Nutrition Assessment  DOCUMENTATION CODES:   Not applicable  INTERVENTION:  RD to provide nutritional supplements once diet advances as appropriate.   NUTRITION DIAGNOSIS:   Increased nutrient needs related to post-op healing, acute illness(shunt removal, bacterial meningitis) as evidenced by estimated needs.  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 CSF culture from the VP shunt is growing moderate klebsiella.  Pt currently NPO, undergoing shunt removal today. Wife at bedside. Pt asleep during time of visit. Wife requests to let pt sleep and to not awaken him. Wife reports pt was eating well PTA with usual consumption of 3 meals a day up until the 1 day prior to admission where pt started to develop n/v. Pt with no weight loss per weight records. Plans for pt to transfer to 4N ICU post surgery. RD to continue to monitor and order nutritional interventions as appropriate.   Unable to complete Nutrition-Focused physical exam at this time.   Labs and medications reviewed.   Diet Order:   Diet Order            Diet NPO time specified  Diet effective now              EDUCATION NEEDS:   Not appropriate for education at this time  Skin:  Skin Assessment: Skin Integrity Issues: Skin Integrity Issues:: Incisions Incisions: head  Last BM:  9/27  Height:   Ht Readings from Last 1 Encounters:  08/08/18 5\' 11"  (1.803 m)    Weight:   Wt Readings from Last 1 Encounters:  08/08/18 86.2 kg    Ideal Body Weight:  78 kg  BMI:  Body mass index is 26.5 kg/m.  Estimated Nutritional Needs:   Kcal:  2100-2300  Protein:  110-120  grams  Fluid:  2.1 - 2.3 L/day    Roslyn Smiling, MS, RD, LDN Pager # [phone] After hours/ weekend pager # [phone]

## 2018-08-09 NOTE — Progress Notes (Signed)
OT Cancellation Note  Patient Details Name: Charles Marquez MRN: [id number] DOB: 03/27/1956   Cancelled Treatment:    Reason Eval/Treat Not Completed: Other (comment)(pt being transferred to 4N ) ICU.  Will follow and initate OT eval when able.   Chancy Milroy, OT Acute Rehabilitation Services Pager [phone] Office [phone]   Chancy Milroy 08/09/2018, 1:32 PM

## 2018-08-09 NOTE — Progress Notes (Addendum)
   Subjective: Overnight, Charles Marquez developed double vision. He was evaluated by the night team who noted new nystagmus on exam. His double revision resolved while speaking with night team. This morning, his double vision returned while interviewing the patient and he reported seeing two of each physician in the room. He continues to endorse global weakness, hereditary resting tremor, and gait instability. His wife reports he is still "hallucinating." He denies nausea, vomiting, or vertigo. He continued to fever overnight with a Tmax of 103.2, for which he received tylenol. He fevered again this morning to 102.5 and once again got tylenol.   Objective:  Vital signs in last 24 hours: Vitals:   08/08/18 1830 08/08/18 2027 08/08/18 2300 08/09/18 0500  BP: (!) 139/119 (!) 158/94    Pulse: 68 67    Resp: (!) 21 (!) 21    Temp:  (!) 103.2 F (39.6 C) 98.8 F (37.1 C) (!) 102.5 F (39.2 C)  TempSrc:  Oral Oral Oral  SpO2: 99% 96%    Weight:      Height:       Constitutional: Resting comfortably in bed. HENT: Right parietal incision with staples. The wound appears clean, dry, and intact without surrounding erythema. No nuchal rigidity. Mid abdominal well-healed incision without erythema. Neuro:Alert and oriented x3. Endorses double vision. Bidirectional nystagmus. Pulmonary/Chest:Effort normal. Clear to auscultation bilaterally. No wheezes, rales, or rhonchi. Dullness to percussion over the left lower lung field. Ext: No lower extremity edema. Skin: Warm and dry. No rashes or wounds.  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 CSF culture growing gram negative rods.  Acute Encephalopathy Fever of Unknown  Origin - Patient continues to fever with Tmax of 103.2. He also has new intermittent double vision with bidirectional nystagmus on exam. CSF culture is growing gram negative rods. Leukocytosis resolved. Procal negative x2. - His fever and symptoms are most likely caused by CSF infection and post-operative inflammatory changes in the brain. VP shunt malfunction or autonomic dysfunction are also on the differential. Because his symptoms are similar to his previous symptoms with hydrocephalus, there is low suspicion for a new TIA or CVA. . - Will narrow antibiotic therapy from Vanc and Cefepime to Cefepime alone to cover for CNS GNRs - Gaze-evoked nystagmus most likely relates to his infection and post-operative inflammatory changes in the brain. Low suspicion for new ischemic insult. However, low threshold for brain MRI if he develops vertigo or other new neurological symptoms. Plan - Neurosurgery following, appreciate recs - Telemetry - Tylenol for pain or fever - Cefepime - Zofran q6hrs for nausea - q4hr neuro exams - am CBC and CMP - f/u blood cultures and CSF cultures - advance diet as tolerates - OT/PT  Hypokalemia - Hypokalemic to 3.0 (3.1 yesterday). Has been receiving LR with K.  Plan - Replace K with PO supplementation   Dispo: Anticipated discharge in approximately 2-3 days.  Dorrell, Cathleen Corti, MD 08/09/2018, 8:01 AM Pager: [phone]

## 2018-08-09 NOTE — Progress Notes (Signed)
Pt arrived to unit from PACU.  Pt is alert and oriented.  Verbal order from neurosurgery to put EVD at 20 mmHg.  Will continue to monitor.

## 2018-08-09 NOTE — Progress Notes (Signed)
Received call from nursing regarding possible EVD malfunction. Came by to evaluate drain. There was no output so EVD was flushed with 6cc NS. There was immediate return with fluctuating meniscus, although still no drainage. Rechecked 20-25 minutes later and EVD still with pulsating meniscus. Will continue to monitor closely. Should neuro exam decline, will obtain CT head to assess placement and consider re-insertion.

## 2018-08-09 NOTE — Evaluation (Signed)
Physical Therapy Evaluation Patient Details Name: Charles Marquez MRN: [id number] DOB: [birth year] Today's Date: 08/09/2018   History of Present Illness  62yo male prsenting with N/V, unsteady gait, memory impairments. Note recent hx of VP shunt placement, and later shunt revision on 08/05/18. He was feeling better after the revision but then these symptoms started. CT shows ongoing hydrocephalus but it is improved from 08/04/18. PMH anxiety, TIA, L UE fx, VP shunt placement, VP shunt revision   Clinical Impression   Patient received in bed, pleasant and willing to participate in PT session. He reports ongoing fluctuating double vision but no dizziness; able to track well with eyes, note some end range nystagmus but no dizziness reported except briefly (and resolved quickly) with functional transfers. Defer to OT for visual/perceptual examination. Able to complete bed mobility with Mod(I), functional transfers with RW with S and VC for safety, and gait approximately 15f with RW and min guard, VC for navigation and obstacle avoidance due to double vision. He was left in bed with all needs met, family present. He will continue to benefit from skilled PT services in the acute setting, as well as skilled HHPT services to further address functional deficits and reduce fall risk moving forward.      Follow Up Recommendations Home health PT;Other (comment)(if he improves significantly in hospital, may be able to upgrade to OP PT )    Equipment Recommendations  Rolling walker with 5" wheels;3in1 (PT)    Recommendations for Other Services       Precautions / Restrictions Precautions Precautions: Fall;Other (comment) Precaution Comments: double vision  Restrictions Weight Bearing Restrictions: No      Mobility  Bed Mobility Overal bed mobility: Modified Independent             General bed mobility comments: no physical assist given   Transfers Overall transfer level: Needs  assistance Equipment used: Rolling walker (2 wheeled) Transfers: Sit to/from Stand Sit to Stand: Supervision         General transfer comment: S for safety, cues for hand placement   Ambulation/Gait Ambulation/Gait assistance: Min guard Gait Distance (Feet): 100 Feet Assistive device: Rolling walker (2 wheeled) Gait Pattern/deviations: Step-through pattern;Decreased step length - right;Decreased step length - left;Decreased stride length;Trunk flexed     General Gait Details: steady with RW, requires cues for navigation and obstacle avoidance due to double vision   Stairs            Wheelchair Mobility    Modified Rankin (Stroke Patients Only)       Balance Overall balance assessment: Needs assistance;History of Falls Sitting-balance support: No upper extremity supported;Feet supported Sitting balance-Leahy Scale: Good     Standing balance support: Bilateral upper extremity supported;During functional activity Standing balance-Leahy Scale: Fair Standing balance comment: reliant on UE support                              Pertinent Vitals/Pain Pain Assessment: No/denies pain    Home Living Family/patient expects to be discharged to:: Private residence Living Arrangements: Spouse/significant other Available Help at Discharge: Family;Available PRN/intermittently Type of Home: House Home Access: Stairs to enter Entrance Stairs-Rails: Can reach both Entrance Stairs-Number of Steps: 5 Home Layout: Two level Home Equipment: None      Prior Function Level of Independence: Independent         Comments: works in [employer], technical salesfor [employer]  Extremity/Trunk Assessment   Upper Extremity Assessment Upper Extremity Assessment: Defer to OT evaluation    Lower Extremity Assessment Lower Extremity Assessment: Overall WFL for tasks assessed    Cervical / Trunk Assessment Cervical / Trunk Assessment: Normal  Communication    Communication: No difficulties  Cognition Arousal/Alertness: Awake/alert Behavior During Therapy: WFL for tasks assessed/performed Overall Cognitive Status: Within Functional Limits for tasks assessed                                        General Comments General comments (skin integrity, edema, etc.): ongoing double vision, no dizziness except for with sit to stand, quickly resolved with static standing. Noted end range nystagmus with eye ROM. Fluctuating double vision.     Exercises     Assessment/Plan    PT Assessment Patient needs continued PT services  PT Problem List Decreased mobility;Decreased safety awareness;Decreased coordination;Decreased balance       PT Treatment Interventions DME instruction;Therapeutic activities;Gait training;Therapeutic exercise;Patient/family education;Stair training;Balance training;Functional mobility training;Neuromuscular re-education    PT Goals (Current goals can be found in the Care Plan section)  Acute Rehab PT Goals Patient Stated Goal: to go home, keep mobility up  PT Goal Formulation: With patient/family Time For Goal Achievement: 08/23/18 Potential to Achieve Goals: Good    Frequency Min 3X/week   Barriers to discharge        Co-evaluation               AM-PAC PT "6 Clicks" Daily Activity  Outcome Measure Difficulty turning over in bed (including adjusting bedclothes, sheets and blankets)?: None Difficulty moving from lying on back to sitting on the side of the bed? : None Difficulty sitting down on and standing up from a chair with arms (e.g., wheelchair, bedside commode, etc,.)?: A Little Help needed moving to and from a bed to chair (including a wheelchair)?: A Little Help needed walking in hospital room?: A Little Help needed climbing 3-5 steps with a railing? : A Little 6 Click Score: 20    End of Session   Activity Tolerance: Patient tolerated treatment well Patient left: in bed;with call  bell/phone within reach;with family/visitor present   PT Visit Diagnosis: Unsteadiness on feet (R26.81);Difficulty in walking, not elsewhere classified (R26.2)    Time: 0912-0926 PT Time Calculation (min) (ACUTE ONLY): 14 min   Charges:   PT Evaluation $PT Eval Moderate Complexity: 1 Mod          Deniece Ree PT, DPT, CBIS  Supplemental Physical Therapist [place]    Pager [phone] Acute Rehab Office [phone]

## 2018-08-09 NOTE — Anesthesia Procedure Notes (Signed)
Procedure Name: Intubation Date/Time: 08/09/2018 3:25 PM Performed by: Tillman Abide, CRNA Pre-anesthesia Checklist: Patient identified, Emergency Drugs available, Suction available and Patient being monitored Patient Re-evaluated:Patient Re-evaluated prior to induction Oxygen Delivery Method: Circle System Utilized Preoxygenation: Pre-oxygenation with 100% oxygen Induction Type: IV induction Ventilation: Mask ventilation without difficulty Laryngoscope Size: Miller and 2 Grade View: Grade II Tube type: Oral Number of attempts: 1 Airway Equipment and Method: Stylet Placement Confirmation: ETT inserted through vocal cords under direct vision,  positive ETCO2 and breath sounds checked- equal and bilateral Secured at: 23 cm Tube secured with: Tape Dental Injury: Teeth and Oropharynx as per pre-operative assessment

## 2018-08-09 NOTE — Progress Notes (Signed)
EVD is not draining but is pulsating.  Neurosurgery notified.  No new orders at this time.  RN will continue to monitor.

## 2018-08-10 ENCOUNTER — Inpatient Hospital Stay (HOSPITAL_COMMUNITY): Payer: BLUE CROSS/BLUE SHIELD

## 2018-08-10 DIAGNOSIS — J189 Pneumonia, unspecified organism: Secondary | ICD-10-CM

## 2018-08-10 DIAGNOSIS — B9689 Other specified bacterial agents as the cause of diseases classified elsewhere: Secondary | ICD-10-CM

## 2018-08-10 DIAGNOSIS — G0490 Encephalitis and encephalomyelitis, unspecified: Secondary | ICD-10-CM

## 2018-08-10 LAB — BASIC METABOLIC PANEL
Anion gap: 7 (ref 5–15)
BUN: 10 mg/dL (ref 6–20)
CO2: 24 mmol/L (ref 22–32)
CREATININE: 0.86 mg/dL (ref 0.61–1.24)
Calcium: 7.9 mg/dL — ABNORMAL LOW (ref 8.9–10.3)
Chloride: 99 mmol/L (ref 98–111)
GFR calc non Af Amer: >60 mL/min (ref >60)
GLUCOSE: 125 mg/dL — AB (ref 70–99)
Potassium: 3.5 mmol/L (ref 3.5–5.1)
Sodium: 130 mmol/L — ABNORMAL LOW (ref 135–145)

## 2018-08-10 LAB — CBC
HCT: 33.6 % — ABNORMAL LOW (ref 39.0–52.0)
Hemoglobin: 11.5 g/dL — ABNORMAL LOW (ref 13.0–17.0)
MCH: 31.2 pg (ref 26.0–34.0)
MCHC: 34.2 g/dL (ref 30.0–36.0)
MCV: 91.1 fL (ref 78.0–100.0)
Platelets: 169 10*3/uL (ref 150–400)
RBC: 3.69 MIL/uL — ABNORMAL LOW (ref 4.22–5.81)
RDW: 11.5 % (ref 11.5–15.5)
WBC: 9.4 10*3/uL (ref 4.0–10.5)

## 2018-08-10 LAB — PROCALCITONIN: Procalcitonin: 0.31 ng/mL

## 2018-08-10 MED ORDER — SODIUM CHLORIDE 0.9 % IV SOLN
2.0000 g | Freq: Two times a day (BID) | INTRAVENOUS | Status: DC
  Administered 2018-08-10 – 2018-08-22 (×24): 2 g via INTRAVENOUS
  Filled 2018-08-10 (×25): qty 20

## 2018-08-10 NOTE — Progress Notes (Signed)
Pts EVD with no drainage.  Unable to determine if drain pulsating due to minimal output.  Pt confused and having hallucinations intermittently.  Vinnie PA neurosx on call notified, will come bedside to assess.

## 2018-08-10 NOTE — Op Note (Signed)
  NEUROSURGERY OPERATIVE NOTE   PREOP DIAGNOSIS:  1. Shunt infection   POSTOP DIAGNOSIS: Same  PROCEDURE: 1. Removal of proximal shunt catheter, valve 2. Placement of right frontal EVD  SURGEON: Dr. Lisbeth Renshaw, MD  ASSISTANT: Cindra Presume, PA-C  ANESTHESIA: General Endotracheal  EBL: 50cc  SPECIMENS: Proximal shunt catheter for culture  DRAINS: right frontal EVD  COMPLICATIONS: None immediate  CONDITION: Hemodynamically stable to PACU  HISTORY: Charles Marquez is a 62 y.o. male with a history of shunt dependent hydrocephalus, who initially presented with headache and fever.  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  With the microbiologic findings and his clinical presentation, removal of the shunt and placement of an external ventricular drain was indicated.  The patient of note was not complaining of any pain along the shunt tract, and we therefore discussed leaving the distal portion of the catheter in place and removing the proximal shunt catheter and valve.  The risks and benefits of the surgery were explained in detail to both the patient and his family including daughter and wife.  After all questions were answered, informed consent was obtained and witnessed.  PROCEDURE IN DETAIL: The patient was brought to the operating room. After induction of general anesthesia, the patient was positioned on the operative table in the supine position. All pressure points were meticulously padded.  The right frontal scalp as well as the previous skin incision was then marked out and prepped and draped in the usual sterile fashion.  After timeout was conducted, a small incision was made just anterior to the shunt catheter distal to the valve.  The shunt catheter was then identified, and the distal portion of the catheter was tied off.  The catheter was then cut proximal to the type.  The  previous skin incision was then opened with scissors.  The proximal catheter was then removed along with the valve.  The bur hole was then covered with Gelfoam.  A linear right frontal skin incision was then infiltrated with local anesthetic with epinephrine.  The incision was carried down through the skin and galea through the periosteum.  A self-retaining retractor was placed.  A bur hole was then created at [facility] point.  The dura was then coagulated.  A standard ventricular catheter was then passed in a single attempt using standard anatomic landmarks to a depth of approximately 7 cm at the bone.  I obtained good flow of clear CSF.  The ventricular catheter was then tunneled subcutaneously.  The right frontal incision as well as the previous incision were then closed with 3-0 Vicryl stitches.  All the skin incision were closed with staples.  Sterile dressings were then applied.  The patient was then transferred to the stretcher, extubated, and taken to the postanesthesia care unit in stable hemodynamic condition.  At the end of the case all sponge, needle, instrument, and cottonoid counts were correct.

## 2018-08-10 NOTE — Progress Notes (Signed)
Pt presenting with worsening confusion, now oriented only to self.  EVD still pulsating, but still no drainage.  Vinnie PA neurosx on call notified.  Stat CT ordered.  Will continue to monitor.

## 2018-08-10 NOTE — Progress Notes (Signed)
  NEUROSURGERY PROGRESS NOTE   No issues overnight. Pt has no real complaints this am.  EXAM:  BP (!) 139/92   Pulse 79   Temp (!) 100.9 F (38.3 C) (Axillary)   Resp 17   Ht 5\' 11"  (1.803 m)   Wt 86.2 kg   SpO2 97%   BMI 26.50 kg/m   Awake, alert Speech fluent CN grossly intact  5/5 BUE/BLE  EVD in place, fluctuating  CTH reviewed, new right frontal catheter traverses the midline terminating in the left frontal horn. Some pneumocephalus seen.   IMPRESSION:  62 y.o. 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 Neurologically stable  PLAN: - Cont abx - Cont EVD drainage

## 2018-08-10 NOTE — Progress Notes (Signed)
Physical Therapy Treatment Patient Details Name: Charles Marquez MRN: [id number] DOB: 08-31-1956 Today's Date: 08/10/2018    History of Present Illness 62yo male prsenting with N/V, unsteady gait, memory impairments. Note recent hx of VP shunt placement, and later shunt revision on 08/05/18. He was feeling better after the revision but then these symptoms started. CT shows ongoing hydrocephalus but it is improved from 08/04/18. PMH anxiety, TIA, L UE fx, VP shunt placement, VP shunt revision     PT Comments    Needing minimal assist of 1 or 2 person for gait stability and standing balance.  Emphasized gait without an AD which magnified his instability.   Follow Up Recommendations  Home health PT;Other (comment)(likely transition to OP)     Equipment Recommendations  Rolling walker with 5" wheels;3in1 (PT)    Recommendations for Other Services       Precautions / Restrictions Precautions Precautions: Fall;Other (comment)    Mobility  Bed Mobility                  Transfers Overall transfer level: Needs assistance   Transfers: Sit to/from Stand Sit to Stand: Min guard         General transfer comment: pt using bed frame to maintain his balance  Ambulation/Gait Ambulation/Gait assistance: Min assist;+2 physical assistance Gait Distance (Feet): 350 Feet Assistive device: None;1 person hand held assist Gait Pattern/deviations: Step-through pattern Gait velocity: moderate Gait velocity interpretation: <1.8 ft/sec, indicate of risk for recurrent falls General Gait Details: unsteady overall without AD.  pt unable to control speed or keep from scissoring or wandering the hall without physical assist.  festinating pattern at time with assistive restraint to hold him back.   Stairs             Wheelchair Mobility    Modified Rankin (Stroke Patients Only)       Balance Overall balance assessment: Needs assistance;History of Falls   Sitting  balance-Leahy Scale: Good     Standing balance support: Single extremity supported;Bilateral upper extremity supported Standing balance-Leahy Scale: Poor Standing balance comment: reliant on external support and or AD                            Cognition Arousal/Alertness: Awake/alert Behavior During Therapy: WFL for tasks assessed/performed Overall Cognitive Status: Within Functional Limits for tasks assessed                                        Exercises      General Comments General comments (skin integrity, edema, etc.): vss      Pertinent Vitals/Pain Pain Assessment: Faces Faces Pain Scale: No hurt    Home Living                      Prior Function            PT Goals (current goals can now be found in the care plan section) Acute Rehab PT Goals Patient Stated Goal: to go home, keep mobility up  PT Goal Formulation: With patient/family Time For Goal Achievement: 08/23/18 Potential to Achieve Goals: Good Progress towards PT goals: Progressing toward goals    Frequency    Min 3X/week      PT Plan Current plan remains appropriate    Co-evaluation  AM-PAC PT "6 Clicks" Daily Activity  Outcome Measure  Difficulty turning over in bed (including adjusting bedclothes, sheets and blankets)?: None Difficulty moving from lying on back to sitting on the side of the bed? : None Difficulty sitting down on and standing up from a chair with arms (e.g., wheelchair, bedside commode, etc,.)?: A Little Help needed moving to and from a bed to chair (including a wheelchair)?: A Little Help needed walking in hospital room?: A Little Help needed climbing 3-5 steps with a railing? : A Little 6 Click Score: 20    End of Session         PT Visit Diagnosis: Unsteadiness on feet (R26.81);Difficulty in walking, not elsewhere classified (R26.2)     Time: 1540-1600 PT Time Calculation (min) (ACUTE ONLY): 20  min  Charges:  $Gait Training: 8-22 mins                     08/10/2018  Eastman Bing, PT Acute Rehabilitation Services [phone]  (pager) [phone]  (office)   Eliseo Gum Gredmarie Delange 08/10/2018, 4:37 PM

## 2018-08-10 NOTE — Progress Notes (Addendum)
   Subjective: The patient was resting in his bed today. He continues to have worsening mental status as per spouse. He is unable to recall his name but continues to be confused and has hallucinated on occasion.   Neurosurgery has asked that we follow for medical management of his chronic conditions.   Objective:  Vital signs in last 24 hours: Vitals:   08/10/18 0300 08/10/18 0400 08/10/18 0500 08/10/18 0600  BP: 140/83 139/89 123/78 (!) 146/91  Pulse: 62 (!) 141 66 (!) 59  Resp: 15 (!) 28 10 12   Temp:  99.6 F (37.6 C)    TempSrc:  Oral    SpO2: 94% 91% 92% 94%  Weight:      Height:       Physical Exam: General: Alert, oriented to self, unable to recall his location, but does denote that he has been hospitalized for an uncertain neurological illness. Eyes: PERRL, OEM intact  Cardio: RRR, no mrg's auscultated  Pulm: CTA bilaterally  Assessment/Plan:  Principal Problem:   Bacterial meningitis Active Problems:   Acute encephalopathy   Infection of ventricular shunt (HCC)  Charles Marquez is a 62 yo M w/ a 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 He was transitioned to ceftriaxone from cefepime for better CNS coverage given the sensitivity results of the specimen on 08/10/2018.   A/P: Acute Encephalopathy 2/2 to communicating hydrocephalus and encephalitis: Patient remains encephalopathic today, disoriented. Clinically has not improved today. CSF Cx positive for Klebsiella pneumonia. Given sensitivity results and per ID phone conversation we will discontinue cefepime and initiate Ceftriaxone 2g IV Q12 hours for possibly better CSF penetration/coverage.  -Ceftriaxone 2g IV Q12 hours -ID recommended  repeating CSF Cx in 24-28 hours for gram stain/ cell count -Appreciate Neurosurgeries consult  -Will continue to follow as needed  HTN: Currently relatively normotensive but given his tentative state I am hesitant to consider initiating an antihypertensive and prefer to monitor this for now.   Hyponatremia, ?SAIDH: Due to his increased intracranial pressure and worsening hyponatremia with rehydration we will recommend discontinuing the fluids and fluid restrict as tolerated.   Anemia: Currently stable, uncertain etiology. No clear hemorrhage or source of blood loss. Will monitor. -CBC in am   Diet: Regular Code: Full VTE PPX: SCD's (intracranial surgery) Dispo: Anticipated discharge in approximately when medically stable for discharge.   Lanelle Bal, MD 08/10/2018, 7:22 AM Pager: Pager# [phone]

## 2018-08-11 ENCOUNTER — Inpatient Hospital Stay (HOSPITAL_COMMUNITY): Payer: BLUE CROSS/BLUE SHIELD

## 2018-08-11 DIAGNOSIS — E876 Hypokalemia: Secondary | ICD-10-CM

## 2018-08-11 DIAGNOSIS — E871 Hypo-osmolality and hyponatremia: Secondary | ICD-10-CM

## 2018-08-11 LAB — BASIC METABOLIC PANEL
ANION GAP: 10 (ref 5–15)
ANION GAP: 10 (ref 5–15)
ANION GAP: 10 (ref 5–15)
ANION GAP: 9 (ref 5–15)
BUN: 13 mg/dL (ref 6–20)
BUN: 12 mg/dL (ref 6–20)
BUN: 11 mg/dL (ref 6–20)
BUN: 14 mg/dL (ref 6–20)
CALCIUM: 7.7 mg/dL — AB (ref 8.9–10.3)
CALCIUM: 7.8 mg/dL — AB (ref 8.9–10.3)
CO2: 26 mmol/L (ref 22–32)
CO2: 25 mmol/L (ref 22–32)
CO2: 26 mmol/L (ref 22–32)
CO2: 23 mmol/L (ref 22–32)
CREATININE: 0.86 mg/dL (ref 0.61–1.24)
Calcium: 7.8 mg/dL — ABNORMAL LOW (ref 8.9–10.3)
Calcium: 7.5 mg/dL — ABNORMAL LOW (ref 8.9–10.3)
Chloride: 92 mmol/L — ABNORMAL LOW (ref 98–111)
Chloride: 91 mmol/L — ABNORMAL LOW (ref 98–111)
Chloride: 91 mmol/L — ABNORMAL LOW (ref 98–111)
Chloride: 95 mmol/L — ABNORMAL LOW (ref 98–111)
Creatinine, Ser: 0.88 mg/dL (ref 0.61–1.24)
Creatinine, Ser: 0.71 mg/dL (ref 0.61–1.24)
Creatinine, Ser: 0.83 mg/dL (ref 0.61–1.24)
GFR calc non Af Amer: >60 mL/min (ref >60)
GFR calc non Af Amer: >60 mL/min (ref >60)
GFR calc non Af Amer: >60 mL/min (ref >60)
GLUCOSE: 106 mg/dL — AB (ref 70–99)
Glucose, Bld: 120 mg/dL — ABNORMAL HIGH (ref 70–99)
Glucose, Bld: 110 mg/dL — ABNORMAL HIGH (ref 70–99)
Glucose, Bld: 148 mg/dL — ABNORMAL HIGH (ref 70–99)
Potassium: 2.9 mmol/L — ABNORMAL LOW (ref 3.5–5.1)
Potassium: 3.5 mmol/L (ref 3.5–5.1)
Potassium: 3.5 mmol/L (ref 3.5–5.1)
Potassium: 3.4 mmol/L — ABNORMAL LOW (ref 3.5–5.1)
SODIUM: 126 mmol/L — AB (ref 135–145)
SODIUM: 127 mmol/L — AB (ref 135–145)
SODIUM: 127 mmol/L — AB (ref 135–145)
Sodium: 128 mmol/L — ABNORMAL LOW (ref 135–145)

## 2018-08-11 LAB — CSF CELL COUNT WITH DIFFERENTIAL
LYMPHS CSF: 11 % — AB (ref 40–80)
Monocyte-Macrophage-Spinal Fluid: 5 % — ABNORMAL LOW (ref 15–45)
RBC Count, CSF: 2 /mm3 — ABNORMAL HIGH
Segmented Neutrophils-CSF: 84 % — ABNORMAL HIGH (ref 0–6)
WBC, CSF: 490 /mm3 (ref 0–5)

## 2018-08-11 LAB — CSF CULTURE W GRAM STAIN

## 2018-08-11 LAB — OSMOLALITY, URINE: Osmolality, Ur: 387 mOsm/kg (ref 300–900)

## 2018-08-11 LAB — PROTEIN AND GLUCOSE, CSF: Total  Protein, CSF: 577 mg/dL — ABNORMAL HIGH (ref 15–45)

## 2018-08-11 LAB — CBC
HCT: 37.6 % — ABNORMAL LOW (ref 39.0–52.0)
HEMOGLOBIN: 13 g/dL (ref 13.0–17.0)
MCH: 31.4 pg (ref 26.0–34.0)
MCHC: 34.6 g/dL (ref 30.0–36.0)
MCV: 90.8 fL (ref 78.0–100.0)
Platelets: 171 10*3/uL (ref 150–400)
RBC: 4.14 MIL/uL — AB (ref 4.22–5.81)
RDW: 11.6 % (ref 11.5–15.5)
WBC: 10.1 10*3/uL (ref 4.0–10.5)

## 2018-08-11 LAB — NA AND K (SODIUM & POTASSIUM), RAND UR
Potassium Urine: 15 mmol/L
SODIUM UR: 117 mmol/L

## 2018-08-11 LAB — CSF CULTURE

## 2018-08-11 MED ORDER — SODIUM CHLORIDE 0.9 % IV SOLN
INTRAVENOUS | Status: DC
  Administered 2018-08-11: 11:00:00 via INTRAVENOUS
  Filled 2018-08-11 (×3): qty 1000

## 2018-08-11 MED ORDER — POLYVINYL ALCOHOL 1.4 % OP SOLN
1.0000 [drp] | OPHTHALMIC | Status: DC | PRN
  Filled 2018-08-11: qty 15

## 2018-08-11 MED ORDER — POTASSIUM CHLORIDE 10 MEQ/100ML IV SOLN
10.0000 meq | INTRAVENOUS | Status: AC
  Administered 2018-08-11 (×2): 10 meq via INTRAVENOUS
  Filled 2018-08-11 (×2): qty 100

## 2018-08-11 MED ORDER — POTASSIUM CHLORIDE IN NACL 40-0.9 MEQ/L-% IV SOLN
INTRAVENOUS | Status: DC
  Administered 2018-08-11 – 2018-08-12 (×2): 150 mL/h via INTRAVENOUS
  Filled 2018-08-11 (×2): qty 1000

## 2018-08-11 MED ORDER — VANCOMYCIN HCL IN DEXTROSE 1-5 GM/200ML-% IV SOLN
1000.0000 mg | Freq: Three times a day (TID) | INTRAVENOUS | Status: DC
  Administered 2018-08-11 – 2018-08-14 (×9): 1000 mg via INTRAVENOUS
  Filled 2018-08-11 (×9): qty 200

## 2018-08-11 MED ORDER — POTASSIUM CHLORIDE CRYS ER 20 MEQ PO TBCR
40.0000 meq | EXTENDED_RELEASE_TABLET | Freq: Two times a day (BID) | ORAL | Status: AC
  Administered 2018-08-11 (×2): 40 meq via ORAL
  Filled 2018-08-11 (×2): qty 2

## 2018-08-11 MED ORDER — SODIUM CHLORIDE 0.9 % IV SOLN: INTRAVENOUS | Status: DC

## 2018-08-11 MED ORDER — VANCOMYCIN HCL 10 G IV SOLR
1500.0000 mg | Freq: Once | INTRAVENOUS | Status: AC
  Administered 2018-08-11: 1500 mg via INTRAVENOUS
  Filled 2018-08-11: qty 1500

## 2018-08-11 NOTE — Progress Notes (Signed)
  NEUROSURGERY PROGRESS NOTE   Pt has become more confused since yesterday.  EXAM:  BP (!) 163/94 (BP Location: Left Arm)   Pulse 64   Temp 100.1 F (37.8 C)   Resp (!) 7   Ht 5\' 11"  (1.803 m)   Wt 86.2 kg   SpO2 94%   BMI 26.50 kg/m   Awake, alert Answers simple questions CN grossly intact  5/5 BUE/BLE  EVD in place, no output overnight with popoff at .  IMPRESSION:  62 y.o. male with Klebsiella shunt infection s/p proximal removal and EVD placement. Clinical decline with no output since Friday afternoon, likely has low-pressure HCP. Mild Hyponatremia - Na 128  PLAN: - Drained 15cc yellow CSF by lowering EVD. Will send for routine studies - Will drain 10cc Q hour - Will get CT head today - ID consult. Currently on Ceftriaxone - 1L fluid restriction

## 2018-08-11 NOTE — Progress Notes (Signed)
Pharmacy Antibiotic Note  Charles Marquez is a 62 y.o. male admitted on 08/08/2018 with nausea and memory loss.  Currently on IV ceftriaxone for encephalitis/shunt infx. CSF shunt growing GPCs in pairs. Pharmacy consulted to restart vancomycin until cultures grow out.  Plan: Vancomycin 1500mg  IV x 1 then 1gm IV Q8H Cefepime 2gm IV Q8H F/u renal fxn, C&S, clinical status and trough at SS  Height: 5\' 11"  (180.3 cm) Weight: 190 lb (86.2 kg) IBW/kg (Calculated) : 75.3  Temp (24hrs), Avg:99.9 F (37.7 C), Min:98.2 F (36.8 C), Max:102 F (38.9 C)  Recent Labs  Lab 08/04/18 1703 08/08/18 1040 08/08/18 1100 08/09/18 0513 08/10/18 0300 08/11/18 0319 08/11/18 1217  WBC 12.0* 12.9*  --  10.0 9.4 10.1  --   CREATININE 1.05 0.95  --  0.93 0.86 0.86 0.88  LATICACIDVEN  --   --  1.15  --   --   --   --     Estimated Creatinine Clearance: 95.1 mL/min (by C-G formula based on SCr of 0.88 mg/dL).    No Known Allergies  Antimicrobials this admission: Vanc 9/26>> Cefepime 9/26>>  Dose adjustments this admission: N/A  Microbiology results: 9/27 Wound: Rare GNRs 9/29 CSF: GPCs in pairs 9/29 BCx2>>   Thank you for allowing pharmacy to be a part of this patient's care.  Vinnie Level, PharmD., BCPS Clinical Pharmacist Clinical phone for 08/11/18 until 8:30pm: [phone]

## 2018-08-11 NOTE — Progress Notes (Addendum)
   Subjective: Patient was lying in his bed today upon entering the room.  He is currently unable to speak and clinically appears to have worsened as per patient's wife. The patients wife is concerned as he has remained febrile overnight with notable diaphoresis and declining responsiveness.  Objective:  Vital signs in last 24 hours: Vitals:   08/11/18 0600 08/11/18 0700 08/11/18 0800 08/11/18 0835  BP: (!) 155/101 (!) 157/106 (!) 163/94   Pulse: 65 62 63 64  Resp: (!) 21 17 18  (!) 7  Temp:   (!) 102 F (38.9 C) 100.1 F (37.8 C)  TempSrc:   Oral   SpO2: 100% 100% 97% 94%  Weight:      Height:       Physical exam: General: febrile, diaphoretic, responsive to verbal commands by opening eyes and attempting command, does not spontaneously initiate movement, Cardiac: RRR, no mrg's Pulm: CTA, with patient lying supine  MSK: nonedematous, nontender  Assessment/Plan:  Active Problems:   Acute encephalopathy   Infection of ventricular shunt (HCC)   Bacterial encephalitis  Charles Marquez is a 62 yo M w/ a 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 He was transitioned to ceftriaxone from cefepime for better CNS coverage given the sensitivity results of the specimen on 08/10/2018. The patient continued to decline symptomatically by 09/29 prompting repeat blood cultures, repeat CSF cultures and official consultation to the infectious disease team.   A/P: Acute Encephalopathy 2/2 to communicating hydrocephalus and encephalitis: Patient continues to decline symptomatically today.  He remains disoriented and unable to complete verbal requests or respond verbally. Patient remains hemodynamically stable.  CBC remains  unremarkable with a WBC count of 10.1 hemoglobin 13. - Continue ceftriaxone 2 g IV every 12 hours - Infectious disease consulted, requesting blood and CSF cultures, will follow - Neurosurgery repeated spinal fluid cultures/gram stain collection today - Repeat blood cultures collected - Repeat CBC in a.m.  Electrolyte disturbances, Hyponatremia, Hypokalemia: There is concern for possible SIADH given the patients hydrocephalus and intracranial surgeries and as such we will continue evaluation by monitoring his sodium every 6 hours with BMPs and reevaluate after each.  Due to the patient's apparent hypovolemia on exam in conjunction with his hyponatremia and decreased oral intake we will initiate IV fluids at 150 mL's an hour with 40 mEq of potassium per liter while closely monitoring his progress. Has reduced ability to tolerate PO intake. Will attempt IV supplementation for now. - Repeat BMP's every 6 hours from now - Urine electrolytes completed, urine osmolality ordered to complete evaluation - Ordered 2 doses of oral potassium 40 mEq  Diet: Regular Code: Full VTE PPX: SCD's Dispo: Anticipated discharge in approximately however many days until he is stable. Prognosis is guarded.    Lanelle Bal, MD 08/11/2018, 10:27 AM Pager: Pager# [phone]  Addendum: Neurosurgery restarting Vancomycin due to CSF results.  Appreciate ID recommendation

## 2018-08-11 NOTE — Progress Notes (Addendum)
Gram stain from CSF sent this am significant for gram positive cocci. Also significantly increased protein with CSF glu < 20. Pt currently on Ceftriaxone for previous klebsiella in CSF culture. Will restart Vancomycin and await further culture results.

## 2018-08-11 NOTE — Consult Note (Signed)
[facility] for Infectious Disease  Total days of antibiotics 4        Day 2 ceftriaxone        Day 3 vanco               Reason for Consult: vp shunt infection    Referring Physician: Pool  Active Problems:   Acute encephalopathy   Infection of ventricular shunt (HCC)   Bacterial encephalitis   Hyponatremia   Hypokalemia    HPI: Charles Marquez is a 62 y.o. 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 Did well until early sept 2019, feeling himself and NCHCT showing enlarged ventricles, . He had new right occipital VP shunt placed 9/9 did well post operatively until 9/19 quickly progressed to wide gait, N/V, thus, readmitted for VP shunt revision on 9/23, discharged on 9/24. Then started to have high fevers of 102 on 9/25 but no n/v/ha/gait abn. He was readmitted on 9/26 for evaluation. Shunt was aspirated to rule out VP shunt infection, csf clotted but did show neutrophilic predominance. No organism on gram stain but csf cx did show kleb pneumoniae. He was empirically started on vancomycin and cefepime. His admit blood cx were NGTD. He denied any sick contacts during this period. On 9/27, He had proximal shunt catheter removed (also + cx for k.pneumonaie) and distal portion of catheter left kept in place and a new R frontal EVD placed. In the meantime, despite removal of catheter on 9/27, he remained to still have high fevers up to 102F. He was increasingly lethargic over the last 2 days. Due to only isolating K.pneumonaie- his abtx changed to ceftriaxone with the thought maybe cefepime was affecting his mentation. This morning due to being poorly responsive and still febrile, repeat CSF fluid sent for cell count and culture as well as CSF pump programmed to remove 81m/hr. Since then he has been afebrile after 1 dose of tylenol this am, more alert. Vancomycin was readded to his regimen  since on gram stain it appears to have gpc in prs. CSF profile still consistent with bacterial meningitis/ventriculitis due to WBC 490 with 84% N, low glu of <20, and protein 577. This afternoon, he is able to answer questions appropriately, occasionally nods off to sleep.family reports much improved since the morning. Chest ct did not show signs of pneumonia. Head ct did not suggest significant sinusitis.  No hx of diabetes. No abdominal pain and gi symptoms   Past Medical History:  Diagnosis Date  . Anxiety   . Hypercholesteremia   . [place]    tia's  . TIA (transient ischemic attack)    09.15    Allergies: No Known Allergies   MEDICATIONS: . atorvastatin  40 mg Oral Daily  . potassium chloride  40 mEq Oral BID    Social History   Tobacco Use  . Smoking status: Never Smoker  . Smokeless tobacco: Never Used  Substance Use Topics  . Alcohol use: Yes    Alcohol/week: 0.0 standard drinks    Comment: ocassionally  . Drug use: No    Family History  Problem Relation Age of Onset  . COPD Mother   . Lung cancer Father   . Alzheimer's disease Father   -raynauds mother  Review of Systems -  Per hpi, otherwise 12 point ros is negative  OBJECTIVE: Temp:  [98.2 F (36.8 C)-102 F (38.9 C)] 99.1  F (37.3 C) (09/29 1200) Pulse Rate:  [58-92] 67 (09/29 1300) Resp:  [7-22] 12 (09/29 1300) BP: (117-163)/(78-106) 136/97 (09/29 1000) SpO2:  [94 %-100 %] 100 % (09/29 1300) Physical Exam  Constitutional: He is oriented to person, place, and time. He appears well-developed and well-nourished. No distress.  HENT: right front EVD, with yellow csf in collection system Mouth/Throat: Oropharynx is clear and moist. No oropharyngeal exudate.  Cardiovascular: Normal rate, regular rhythm and normal heart sounds. Exam reveals no gallop and no friction rub.  No murmur heard.  Pulmonary/Chest: Effort normal and breath sounds normal. No respiratory distress. He has no wheezes.    Abdominal: Soft. Bowel sounds are normal. He exhibits no distension. There is no tenderness.  Lymphadenopathy:  He has no cervical adenopathy.  Neurological: He is alert and oriented to person, place, and time.  Skin: Skin is warm and dry. No rash noted. No erythema. slight Cyanotic toes/feet cool to touch - his baseline Psychiatric: He has a normal mood and affect. His behavior is normal.    LABS: Results for orders placed or performed during the hospital encounter of 08/08/18 (from the past 48 hour(s))  Anaerobic culture     Status: None (Preliminary result)   Collection Time: 08/09/18  4:04 PM  Result Value Ref Range   Specimen Description WOUND    Special Requests      NONE Performed at [facility], [address] [place], [place] [address]    Culture      NO ANAEROBES ISOLATED; CULTURE IN PROGRESS FOR 5 DAYS   Report Status PENDING   Aerobic Culture (superficial specimen)     Status: None (Preliminary result)   Collection Time: 08/09/18  4:04 PM  Result Value Ref Range   Specimen Description WOUND    Special Requests NONE    Gram Stain NO WBC SEEN NO ORGANISMS SEEN     Culture      RARE KLEBSIELLA PNEUMONIAE SUSCEPTIBILITIES TO FOLLOW Performed at [facility], [place] [address], [place], [place] [address]    Report Status PENDING   Procalcitonin     Status: None   Collection Time: 08/10/18  3:00 AM  Result Value Ref Range   Procalcitonin 0.31 ng/mL    Comment:        Interpretation: PCT (Procalcitonin) <= 0.5 ng/mL: Systemic infection (sepsis) is not likely. Local bacterial infection is possible. (NOTE)       Sepsis PCT Algorithm           Lower Respiratory Tract                                      Infection PCT Algorithm    ----------------------------     ----------------------------         PCT < 0.25 ng/mL                PCT < 0.10 ng/mL         Strongly encourage             Strongly discourage   discontinuation of antibiotics    initiation  of antibiotics    ----------------------------     -----------------------------       PCT 0.25 - 0.50 ng/mL            PCT 0.10 - 0.25 ng/mL  OR       >80% decrease in PCT            Discourage initiation of                                            antibiotics      Encourage discontinuation           of antibiotics    ----------------------------     -----------------------------         PCT >= 0.50 ng/mL              PCT 0.26 - 0.50 ng/mL               AND        <80% decrease in PCT             Encourage initiation of                                             antibiotics       Encourage continuation           of antibiotics    ----------------------------     -----------------------------        PCT >= 0.50 ng/mL                  PCT > 0.50 ng/mL               AND         increase in PCT                  Strongly encourage                                      initiation of antibiotics    Strongly encourage escalation           of antibiotics                                     -----------------------------                                           PCT <= 0.25 ng/mL                                                 OR                                        > 80% decrease in PCT                                     Discontinue / Do not initiate                                               antibiotics Performed at [facility], [place] [address] [place], [place] [address]   Basic metabolic panel     Status: Abnormal   Collection Time: 08/10/18  3:00 AM  Result Value Ref Range   Sodium 130 (L) 135 - 145 mmol/L   Potassium 3.5 3.5 - 5.1 mmol/L   Chloride 99 98 - 111 mmol/L   CO2 24 22 - 32 mmol/L   Glucose, Bld 125 (H) 70 - 99 mg/dL   BUN 10 6 - 20 mg/dL   Creatinine, Ser 0.86 0.61 - 1.24 mg/dL   Calcium 7.9 (L) 8.9 - 10.3 mg/dL   GFR calc non Af Amer >60 >60 mL/min   GFR calc Af Amer >60 >60 mL/min    Comment: (NOTE) The eGFR has been calculated using the  CKD EPI equation. This calculation has not been validated in all clinical situations. eGFR's persistently <60 mL/min signify possible Chronic Kidney Disease.    Anion gap 7 5 - 15    Comment: Performed at [facility] [address] [place], [place] [address]  CBC     Status: Abnormal   Collection Time: 08/10/18  3:00 AM  Result Value Ref Range   WBC 9.4 4.0 - 10.5 K/uL   RBC 3.69 (L) 4.22 - 5.81 MIL/uL   Hemoglobin 11.5 (L) 13.0 - 17.0 g/dL   HCT 33.6 (L) 39.0 - 52.0 %   MCV 91.1 78.0 - 100.0 fL   MCH 31.2 26.0 - 34.0 pg   MCHC 34.2 30.0 - 36.0 g/dL   RDW 11.5 11.5 - 15.5 %   Platelets 169 150 - 400 K/uL    Comment: Performed at [facility], [place] [address], [place], [place] [address]  Basic metabolic panel     Status: Abnormal   Collection Time: 08/11/18  3:19 AM  Result Value Ref Range   Sodium 128 (L) 135 - 145 mmol/L   Potassium 2.9 (L) 3.5 - 5.1 mmol/L   Chloride 92 (L) 98 - 111 mmol/L   CO2 26 22 - 32 mmol/L   Glucose, Bld 106 (H) 70 - 99 mg/dL   BUN 13 6 - 20 mg/dL   Creatinine, Ser 0.86 0.61 - 1.24 mg/dL   Calcium 7.7 (L) 8.9 - 10.3 mg/dL   GFR calc non Af Amer >60 >60 mL/min   GFR calc Af Amer >60 >60 mL/min    Comment: (NOTE) The eGFR has been calculated using the CKD EPI equation. This calculation has not been validated in all clinical situations. eGFR's persistently <60 mL/min signify possible Chronic Kidney Disease.    Anion gap 10 5 - 15    Comment: Performed at [facility] [address], [place], [place] [address]  CBC     Status: Abnormal   Collection Time: 08/11/18  3:19 AM  Result Value Ref Range   WBC 10.1 4.0 - 10.5 K/uL   RBC 4.14 (L) 4.22 - 5.81 MIL/uL   Hemoglobin 13.0 13.0 - 17.0 g/dL   HCT 37.6 (L) 39.0 - 52.0 %   MCV 90.8 78.0 - 100.0 fL   MCH 31.4 26.0 - 34.0 pg   MCHC 34.6 30.0 - 36.0 g/dL   RDW 11.6 11.5 - 15.5 %   Platelets 171 150 - 400 K/uL    Comment: Performed at [facility], [place] [address],  [place], [place] [address]  Na and K (sodium & potassium), rand urine     Status:  None   Collection Time: 08/11/18  8:40 AM  Result Value Ref Range   Sodium, Ur 117 mmol/L   Potassium Urine 15 mmol/L    Comment: Performed at [facility] [address] [place], [place] [address]  Osmolality, urine     Status: None   Collection Time: 08/11/18  8:40 AM  Result Value Ref Range   Osmolality, Ur 387 300 - 900 mOsm/kg    Comment: Performed at [facility] [address], [place], [place] [address]  Protein and glucose, CSF     Status: Abnormal   Collection Time: 08/11/18  9:30 AM  Result Value Ref Range   Glucose, CSF <20 (LL) 40 - 70 mg/dL    Comment: RESULT REPEATED AND VERIFIED CRITICAL RESULT CALLED TO, READ BACK BY AND VERIFIED WITH: RN Ilona Sorrel AT 1222 [phone] MARTINB    Total  Protein, CSF 577 (H) 15 - 45 mg/dL    Comment: RESULTS CONFIRMED BY MANUAL DILUTION Performed at [facility] [address] [place], [place] [address]   CSF cell count with differential     Status: Abnormal   Collection Time: 08/11/18  9:30 AM  Result Value Ref Range   Tube # ALIQUOT RED TOP    Color, CSF YELLOW (A) COLORLESS   Appearance, CSF HAZY (A) CLEAR   Supernatant XANTHOCHROMIC     Comment: CLEAR   RBC Count, CSF 2 (H) 0 /cu mm   WBC, CSF 490 (HH) 0 - 5 /cu mm    Comment: CRITICAL RESULT CALLED TO, READ BACK BY AND VERIFIED WITH: MY COSTERMAN RN AT 1210 08/11/17 BY WOOLLENK    Segmented Neutrophils-CSF 84 (H) 0 - 6 %    Comment: DISINTEGRATED CELLS PRESENT   Lymphs, CSF 11 (L) 40 - 80 %   Monocyte-Macrophage-Spinal Fluid 5 (L) 15 - 45 %   Other Cells, CSF INTRACELLULAR BACTERIA PRESENT     Comment: PENDING PATHOLOGIST REVIEW Performed at [facility] [address], [place], [place] [address]   CSF culture with Stat gram stain     Status: None (Preliminary result)   Collection Time: 08/11/18 10:12 AM  Result Value Ref Range   Specimen Description CSF     Special Requests NONE    Gram Stain      WBC PRESENT,BOTH PMN AND MONONUCLEAR GRAM POSITIVE COCCI IN PAIRS CRITICAL RESULT CALLED TO, READ BACK BY AND VERIFIED WITH: RN [facility] TROXLER 08/11/18 AT 1412 BY CM Performed at [facility], [place] [address], [place], [place] [address]    Culture PENDING    Report Status PENDING   Basic metabolic panel     Status: Abnormal   Collection Time: 08/11/18 12:17 PM  Result Value Ref Range   Sodium 126 (L) 135 - 145 mmol/L   Potassium 3.5 3.5 - 5.1 mmol/L    Comment: DELTA CHECK NOTED   Chloride 91 (L) 98 - 111 mmol/L   CO2 25 22 - 32 mmol/L   Glucose, Bld 120 (H) 70 - 99 mg/dL   BUN 12 6 - 20 mg/dL   Creatinine, Ser 0.88 0.61 - 1.24 mg/dL   Calcium 7.8 (L) 8.9 - 10.3 mg/dL   GFR calc non Af Amer >60 >60 mL/min   GFR calc Af Amer >60 >60 mL/min    Comment: (NOTE) The eGFR has been calculated using the CKD EPI equation. This calculation has not been validated in all clinical situations. eGFR's  persistently <60 mL/min signify possible Chronic Kidney Disease.    Anion gap 10 5 - 15    Comment: Performed at [facility] [address], [place], [place] [address]    MICRO: 9/26 blood cx ngtd 9/26 csf cx kleb pneumonaie 9/27 wound cx kleb pneumoniae 9/29 csf cx -gram stain gpc prs IMAGING: Ct Head Wo Contrast  Result Date: 08/10/2018 CLINICAL DATA:  Altered level of consciousness. History of ventriculoperitoneal shunt. EXAM: CT HEAD WITHOUT CONTRAST TECHNIQUE: Contiguous axial images were obtained from the base of the skull through the vertex without intravenous contrast. COMPARISON:  CT HEAD August 08, 2018 and MRI head January 29, 2017 FINDINGS: BRAIN: New ventriculostomy catheter via a RIGHT frontal burr hole crossing midline, distal tip in LEFT basal ganglia. Interval removal of RIGHT ventriculoperitoneal shunt with minimal blood products along the catheter track, similar encephalomalacia. Decreased ventriculomegaly, transverse  dimension of the RIGHT temporal horn was 15 mm, now 10 mm. Intraventricular air distending LEFT frontal horn of the lateral ventricle, to lesser extent RIGHT frontal horn. Minimal extra-axial pneumocephalus. Persistent periventricular white matter hypodensities consistent sustained with interstitial edema/transependymal flow cerebral spinal fluid. Global edema and sulcal effacement. Narrowed basal cisterns. No extra-axial fluid collections. VASCULAR: Trace calcific atherosclerosis carotid siphon SKULL/SOFT TISSUES: No skull fracture. New RIGHT frontal burr hole with overlying scalp soft tissue swelling and skin staples. Small RIGHT parietal scalp hematoma and skin staples. ORBITS/SINUSES: The included ocular globes and orbital contents are normal.Trace paranasal sinus mucosal thickening. Mastoid air cells are well aerated. OTHER: None. IMPRESSION: 1. New RIGHT frontal ventriculostomy catheter distal tip in LEFT basal ganglia. Persistent though improved hydrocephalus. Intraventricular pneumocephalus expanding LEFT lateral ventricle. 2. Interval removal of RIGHT parietal ventriculostomy catheter with small amount of postprocedural procedural hemorrhage. Electronically Signed   By: Elon Alas M.D.   On: 08/10/2018 05:46   Assessment/Plan:  62yo M with history of communicating hydrocephaly did well since 2017 but now has had recurrence of symptoms 07/2018 necessitating new VP shunt then required revision on 1/30 now complicated by VP shunt infection s/p removal and EVD placement on 9/27. Still febrile 36hr post removal. Repeat csf suggestive of 2nd pathogen -GPC in prs. Slightly unusual to be kleb pneumonaie instead of bacteria considered typical of skin flora.  For now would continue on vancomycin and ceftriaxone - await culture results from recent csf cx on 9/29 - would repeat csf cell count/ +/- culture late on 9/30 or 10/1 to see that CSF profile is improving and that we are achieving sterility of CSF -  recommend to get abd/pelvis CT to see if possibility of any lower catheter dysfunction. - will need to plan to treat for 14-21days since last positive culture - will send aspergillus ab - if patient still has ongoing fevers/decompensates, would have low threshold to add antifungal therapy

## 2018-08-11 NOTE — Progress Notes (Addendum)
Pt has a criticial WBC of 490 from CSF sample & glucose <20 , V. Costella notified, no new orders at this time.

## 2018-08-11 NOTE — Progress Notes (Signed)
Visited patient at bedside for status update.  The patient was more alert and able to communicate more freely than earlier in the day. Family was updated as to the current treatment plan and evaluation thus far. They are aware of the gram positive cocci in pairs and that antibiotic coverage has been expanded until sensitivities return. The family was updated    His vitals remain stable.  Vitals:   08/11/18 1400 08/11/18 1500  BP: (!) 150/78 (!) 156/97  Pulse: 78 73  Resp: 18 18  Temp:    SpO2: 100% 99%   Plan: Encephalitis: We will await ID's recommendations but after speaking with Dr. Drue Second outside the room it appears that we are currently holding the course with regard to antibiotic treatment.   Hyponatremia/hypokalemia: We will more aggressively monitor the hyponatremia with q4 hour BMP's to assist in eliminating this potentially compounding source of alteration in mental status and to prevent rapid deescalation of his condition which may occur with increased intravascular free water and preexisting hydrocephalus. Given his improvement with CSF fluid removal albeit with interval reduction in hydrocephalus on CT scans the above reasoning is plausible but not definitive. We plan to discontinue fluids if the serum sodium worsens with current treatment and fluid restrict to 1285ml/hr with regard to PO intake. If his sodium remains stable (expecting less than a in Na increase with every liter of NS) I would recommend continuing/increasing fluids cautiously.  Current laboratory evaluation is consistent with possible hyponatremic hypovolemia (given the hypokalemia and hyponatremia) possible severe hypothyroidism, SIADH, glucocorticoid insufficiency, or nephrogenic SIADH. My current interpretation of his volume status is that he is now optivolemic (Euvolemic). -Q4 hour BMP's -TSH -Will need to monitor potassium and replete as indicated.  Lanelle Bal, MD Internal Medicine PGY-2 Pager  # [phone]

## 2018-08-11 NOTE — Progress Notes (Signed)
MD notified of patient oral temperature 102.5. PRN given and ice packs placed. RN will continue to monitor.

## 2018-08-12 ENCOUNTER — Encounter (HOSPITAL_COMMUNITY): Payer: Self-pay | Admitting: Neurosurgery

## 2018-08-12 ENCOUNTER — Inpatient Hospital Stay (HOSPITAL_COMMUNITY): Payer: BLUE CROSS/BLUE SHIELD

## 2018-08-12 DIAGNOSIS — T85730D Infection and inflammatory reaction due to ventricular intracranial (communicating) shunt, subsequent encounter: Secondary | ICD-10-CM

## 2018-08-12 DIAGNOSIS — G910 Communicating hydrocephalus: Secondary | ICD-10-CM

## 2018-08-12 LAB — C-REACTIVE PROTEIN: CRP: 1.9 mg/dL — AB (ref <1.0)

## 2018-08-12 LAB — BASIC METABOLIC PANEL
ANION GAP: 7 (ref 5–15)
ANION GAP: 9 (ref 5–15)
BUN: 10 mg/dL (ref 6–20)
BUN: 12 mg/dL (ref 6–20)
CALCIUM: 7.5 mg/dL — AB (ref 8.9–10.3)
CALCIUM: 7.7 mg/dL — AB (ref 8.9–10.3)
CO2: 23 mmol/L (ref 22–32)
CO2: 23 mmol/L (ref 22–32)
Chloride: 94 mmol/L — ABNORMAL LOW (ref 98–111)
Chloride: 98 mmol/L (ref 98–111)
Creatinine, Ser: 0.78 mg/dL (ref 0.61–1.24)
Creatinine, Ser: 0.79 mg/dL (ref 0.61–1.24)
GFR calc Af Amer: >60 mL/min (ref >60)
GFR calc non Af Amer: >60 mL/min (ref >60)
Glucose, Bld: 111 mg/dL — ABNORMAL HIGH (ref 70–99)
Glucose, Bld: 115 mg/dL — ABNORMAL HIGH (ref 70–99)
POTASSIUM: 3.8 mmol/L (ref 3.5–5.1)
Potassium: 4 mmol/L (ref 3.5–5.1)
Sodium: 126 mmol/L — ABNORMAL LOW (ref 135–145)
Sodium: 128 mmol/L — ABNORMAL LOW (ref 135–145)

## 2018-08-12 LAB — CBC
HCT: 35.2 % — ABNORMAL LOW (ref 39.0–52.0)
Hemoglobin: 12.1 g/dL — ABNORMAL LOW (ref 13.0–17.0)
MCH: 31.3 pg (ref 26.0–34.0)
MCHC: 34.4 g/dL (ref 30.0–36.0)
MCV: 91 fL (ref 78.0–100.0)
PLATELETS: 185 10*3/uL (ref 150–400)
RBC: 3.87 MIL/uL — AB (ref 4.22–5.81)
RDW: 11.8 % (ref 11.5–15.5)
WBC: 8.3 10*3/uL (ref 4.0–10.5)

## 2018-08-12 LAB — AEROBIC CULTURE  (SUPERFICIAL SPECIMEN): GRAM STAIN: NONE SEEN

## 2018-08-12 LAB — AEROBIC CULTURE W GRAM STAIN (SUPERFICIAL SPECIMEN)

## 2018-08-12 LAB — SEDIMENTATION RATE: Sed Rate: 52 mm/hr — ABNORMAL HIGH (ref 0–16)

## 2018-08-12 LAB — VANCOMYCIN, TROUGH: Vancomycin Tr: 12 ug/mL — ABNORMAL LOW (ref 15–20)

## 2018-08-12 LAB — PATHOLOGIST SMEAR REVIEW

## 2018-08-12 MED ORDER — IOHEXOL 300 MG/ML  SOLN
100.0000 mL | Freq: Once | INTRAMUSCULAR | Status: AC | PRN
  Administered 2018-08-12: 100 mL via INTRAVENOUS

## 2018-08-12 MED ORDER — DEXAMETHASONE SODIUM PHOSPHATE 10 MG/ML IJ SOLN
10.0000 mg | Freq: Four times a day (QID) | INTRAMUSCULAR | Status: DC
  Administered 2018-08-12 – 2018-08-14 (×8): 10 mg via INTRAVENOUS
  Filled 2018-08-12 (×8): qty 1

## 2018-08-12 MED ORDER — IOPAMIDOL (ISOVUE-300) INJECTION 61%
INTRAVENOUS | Status: AC
  Filled 2018-08-12: qty 50

## 2018-08-12 NOTE — Anesthesia Postprocedure Evaluation (Signed)
Anesthesia Post Note  Patient: Charles Marquez  Procedure(s) Performed: SHUNT REMOVAL With Placement of Ventricular Catheter (Right ) VENTRICULOSTOMY (Right )     Patient location during evaluation: PACU Anesthesia Type: General Level of consciousness: awake and alert Pain management: pain level controlled Vital Signs Assessment: post-procedure vital signs reviewed and stable Respiratory status: spontaneous breathing, nonlabored ventilation, respiratory function stable and patient connected to nasal cannula oxygen Cardiovascular status: blood pressure returned to baseline and stable Postop Assessment: no apparent nausea or vomiting Anesthetic complications: no    Last Vitals:  Vitals:   08/12/18 0600 08/12/18 0728  BP: 125/90 120/89  Pulse: (!) 55 (!) 57  Resp: 12 20  Temp:    SpO2: 94% 98%    Last Pain:  Vitals:   08/12/18 0400  TempSrc: Axillary  PainSc: 0-No pain                 Briseyda Fehr S

## 2018-08-12 NOTE — Progress Notes (Signed)
Inpatient Rehabilitation  Per PT and OT request, patient was screened by Sherita Decoste for appropriateness for an Inpatient Acute Rehab consult.  At this time we are recommending an Inpatient Rehab consult.  Please order if you are agreeable.    Antoine Vandermeulen, M.A., CCC/SLP Admission Coordinator  [facility]  Cell [phone]  

## 2018-08-12 NOTE — Progress Notes (Signed)
   Subjective: Charles Marquez was febrile to 101 overnight. Otherwise, no overnight events. Charles Marquez appears much improved today compared to yesterday. He is alert to person and situation (but not to city). He denies pain, nausea, or vomiting. He continues to endorse double vision. He has no new symptoms or concerns today.   Objective:  Vital signs in last 24 hours: Vitals:   08/12/18 0100 08/12/18 0200 08/12/18 0300 08/12/18 0400  BP: (!) 142/80 140/86 136/89 (!) 145/87  Pulse: 64 63 63 63  Resp: 16 14 17 12   Temp:    (!) 101 F (38.3 C)  TempSrc:    Axillary  SpO2: 97% 95% 96% 96%  Weight:      Height:       Physical exam Gen: lying comfortably in bed, no distress Neuro: Bidirectional nystagmus. Resting tremor. Strength 5/5 in the upper extremities.  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 He was transitioned to ceftriaxone from cefepime for better CNS coverage given the sensitivity results of the specimen on 08/10/2018. Vancomycin was later added when Gram positive cocci in pairs grew in his CSF.  Encephalitis and communicating hydrocephalus - Patient appears much improved today. CSF cultures from yesterday grew gram positive cocci in pairs. Fungal cx with no growth x24hrs. He is currently on vanc and ceftriaxone. Repeat culture today or tomorrow. CT abdomen/pelvis pending for shunt dysfunction. Neurosurgery is closely monitoring EVD output. Starting decadron 10mg  q6hrs today for  cerebral inflammation. Continuing Keppra for seizure ppx. Continue management per neurosurgery and ID teams.  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 His fluids were stopped overnight for a Na of 126. Na is 128 today. Will continue to hold IVF and fluid restrict. Plan - am BMP  Dispo: Anticipated discharge in approximately however many days until he is stable.   Dorrell, Cathleen Corti, MD 08/12/2018, 6:34 AM Pager: [phone]

## 2018-08-12 NOTE — Progress Notes (Signed)
Occupational Therapy Evaluation Patient Details Name: Charles Marquez MRN: [id number] DOB: 1956-09-29 Today's Date: 08/12/2018    History of Present Illness 62yo male prsenting with N/V, unsteady gait, memory impairments. Note recent hx of VP shunt placement, and later shunt revision on 08/05/18. He was feeling better after the revision but then these symptoms started. CT shows ongoing hydrocephalus but it is improved from 08/04/18. 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 Pt demonstrates a significant functional decline due to deficits noted below. Pt currently requires mod A with mobility due to midline postural control deficits. Partially occluded the nasal portion of L lens to decrease symptoms of diplopia and improve functional vision. Pt reports only seeing 1 image with use of partial occlusion. Due to functional decline, recommend rehab at [facility] to facilitate safe DC home and return to PLOF. Very supportive family. Will follow acutely.     Follow Up Recommendations  CIR;Supervision/Assistance - 24 hour    Equipment Recommendations  3 in 1 bedside commode    Recommendations for Other Services Rehab consult     Precautions / Restrictions Precautions Precautions: Fall;Other (comment)(EVD) Precaution Comments: double vision / taped lens 9/30 Restrictions Weight Bearing Restrictions: No      Mobility Bed Mobility Overal bed mobility: Needs Assistance Bed Mobility: Sit to Supine;Supine to Sit     Supine to sit: Supervision Sit to supine: Supervision   General bed mobility comments: L lateral lean at times  Transfers Overall transfer level: Needs assistance Equipment used: 2 person hand held assist Transfers: Sit to/from [employer] Sit to Stand: Min assist;+2 safety/equipment Stand pivot  transfers: Min assist           Balance Overall balance assessment: Needs assistance Sitting-balance support: No upper extremity supported;Feet supported Sitting balance-Leahy Scale: Fair Sitting balance - Comments: L bias   Standing balance support: Single extremity supported;Bilateral upper extremity supported Standing balance-Leahy Scale: Poor Standing balance comment: Heavy L lean at times                           ADL either performed or assessed with clinical judgement   ADL Overall ADL's : Needs assistance/impaired Eating/Feeding: Set up   Grooming: Set up;Sitting   Upper Body Bathing: Set up;Sitting   Lower Body Bathing: Minimal assistance;Sit to/from stand   Upper Body Dressing : Minimal assistance;Sitting   Lower Body Dressing: Moderate assistance;Sit to/from stand   Toilet Transfer: Moderate assistance;Ambulation(simulated)   Toileting- Clothing Manipulation and Hygiene: Minimal assistance;Sit to/from stand       Functional mobility during ADLs: Moderate assistance;Cueing for safety       Vision Baseline Vision/History: Wears glasses Wears Glasses: At all times Patient Visual Report: Diplopia Vision Assessment?: Yes Eye Alignment: Impaired (comment) Ocular Range of Motion: Within Functional Limits Alignment/Gaze Preference: Within Defined Limits Tracking/Visual Pursuits: Decreased smoothness of horizontal tracking;Decreased smoothness of vertical tracking Saccades: Additional head turns occurred during testing Convergence: Impaired (comment) Diplopia Assessment: Objects split side to side;Present in far gaze Depth Perception: Overshoots;Undershoots Additional Comments: Used partial occlusion on nasal portion of L lens with reduction in complaints of diplopia     Perception Perception Comments: appears [facility]   Praxis Praxis Praxis tested?: Within functional limits    Pertinent Vitals/Pain Pain Assessment:  Faces Faces Pain Scale: No  hurt     Hand Dominance Right   Extremity/Trunk Assessment Upper Extremity Assessment Upper Extremity Assessment: Overall WFL for tasks assessed   Lower Extremity Assessment Lower Extremity Assessment: Defer to PT evaluation   Cervical / Trunk Assessment Cervical / Trunk Assessment: Other exceptions(L bias)   Communication Communication Communication: No difficulties   Cognition Arousal/Alertness: Awake/alert Behavior During Therapy: WFL for tasks assessed/performed Overall Cognitive Status: Impaired/Different from baseline Area of Impairment: Attention;Safety/judgement;Awareness                   Current Attention Level: Selective     Safety/Judgement: Decreased awareness of safety Awareness: Emergent   General Comments: Falling L at times; Mod A given to prevent falls, especially when turning; balance also affected by decreased attention   General Comments       Exercises     Shoulder Instructions      Home Living Family/patient expects to be discharged to:: Private residence Living Arrangements: Spouse/significant other Available Help at Discharge: Family;Available 24 hours/day Type of Home: House Home Access: Stairs to enter [employer] of Steps: 5 Entrance Stairs-Rails: Can reach both Home Layout: Two level Alternate Level Stairs-Number of Steps: flight  Alternate Level Stairs-Rails: Left Bathroom Shower/Tub: Tub/shower unit;Walk-in shower   Bathroom Toilet: Standard Bathroom Accessibility: Yes How Accessible: Accessible via walker Home Equipment: None          Prior Functioning/Environment Level of Independence: Independent        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[employer]; umpires for baseball games        OT Problem List: Decreased strength;Decreased activity tolerance;Impaired balance (sitting and/or standing);Impaired vision/perception;Decreased coordination;Decreased cognition;Decreased safety awareness;Decreased knowledge  of use of DME or AE      OT Treatment/Interventions: Self-care/ADL training;Therapeutic exercise;Neuromuscular education;DME and/or AE instruction;Therapeutic activities;Cognitive remediation/compensation;Visual/perceptual remediation/compensation;Patient/family education;Balance training    OT Goals(Current goals can be found in the care plan section) Acute Rehab OT Goals Patient Stated Goal: to get back to normal OT Goal Formulation: With patient/family Time For Goal Achievement: 08/26/18 Potential to Achieve Goals: Good  OT Frequency: Min 2X/week   Barriers to D/C:            Co-evaluation              AM-PAC PT "6 Clicks" Daily Activity     Outcome Measure Help from another person eating meals?: None Help from another person taking care of personal grooming?: A Little Help from another person toileting, which includes using toliet, bedpan, or urinal?: A Little Help from another person bathing (including washing, rinsing, drying)?: A Little Help from another person to put on and taking off regular upper body clothing?: A Little Help from another person to put on and taking off regular lower body clothing?: A Little 6 Click Score: 19   End of Session Equipment Utilized During Treatment: Gait belt Nurse Communication: Mobility status;Other (comment)(use of partial occlusion)  Activity Tolerance: Patient tolerated treatment well Patient left: in bed;with call bell/phone within reach;with family/visitor present  OT Visit Diagnosis: Other abnormalities of gait and mobility (R26.89);Other symptoms and signs involving cognitive function;Other (comment)(diplopia)                Time: 5621-3086 OT Time Calculation (min): 38 min Charges:  OT General Charges $OT Visit: 1 Visit OT Evaluation $OT Eval Moderate Complexity: 1 Mod OT Treatments $Self Care/Home Management : 8-22 mins  Luisa Dago, OT/L   Acute OT Clinical Specialist Acute  Rehabilitation Services Pager  [phone] Office [phone]   [facility] 08/12/2018, 4:13 PM

## 2018-08-12 NOTE — Progress Notes (Signed)
Physical Therapy Treatment Patient Details Name: Charles Marquez MRN: [id number] DOB: July 25, 1956 Today's Date: 08/12/2018    History of Present Illness 62yo male prsenting with N/V, unsteady gait, memory impairments. Note recent hx of VP shunt placement, and later shunt revision on 08/05/18. He was feeling better after the revision but then these symptoms started. CT shows ongoing hydrocephalus but it is improved from 08/04/18. PMH anxiety, TIA, L UE fx, VP shunt placement, VP shunt revision     PT Comments    Pt mildly more unsteady during gait with heavier list left, festinating gait continuing.  Emphasis on standing balance, transitions and gait stability.   Follow Up Recommendations  CIR;Supervision/Assistance - 24 hour;Other (comment)(needs more coordination and balance control before home)     Equipment Recommendations  Other (comment)(TBA)    Recommendations for Other Services       Precautions / Restrictions Precautions Precautions: Fall;Other (comment)(EVD) Precaution Comments: double vision  Restrictions Weight Bearing Restrictions: No    Mobility  Bed Mobility Overal bed mobility: Needs Assistance Bed Mobility: Sit to Supine;Supine to Sit     Supine to sit: Supervision Sit to supine: Supervision   General bed mobility comments: no physical assist given   Transfers Overall transfer level: Needs assistance Equipment used: 2 person hand held assist Transfers: Sit to/from [employer] Sit to Stand: Min assist;+2 safety/equipment Stand pivot transfers: Min assist       General transfer comment: pt using bed frame to maintain his balance  Ambulation/Gait Ambulation/Gait assistance: Mod assist;+2 physical assistance Gait Distance (Feet): 180 Feet Assistive device: IV Pole;None;1 person hand held assist Gait Pattern/deviations: Step-through pattern Gait velocity: increased festinating like and gets out of control quickly   General Gait  Details: unsteady overall with lean to the Left and wandering left when some control not given to the right side or pt not allowed to use the IV pole for support   Stairs             Wheelchair Mobility    Modified Rankin (Stroke Patients Only)       Balance Overall balance assessment: Needs assistance Sitting-balance support: No upper extremity supported;Feet supported Sitting balance-Leahy Scale: Fair Sitting balance - Comments: L bias   Standing balance support: Single extremity supported;Bilateral upper extremity supported Standing balance-Leahy Scale: Poor Standing balance comment: Heavy L lean at times                            Cognition Arousal/Alertness: Awake/alert Behavior During Therapy: WFL for tasks assessed/performed Overall Cognitive Status: Impaired/Different from baseline Area of Impairment: Attention;Safety/judgement;Awareness                   Current Attention Level: Selective     Safety/Judgement: Decreased awareness of safety Awareness: Emergent   General Comments: Falling L at times      Exercises      General Comments        Pertinent Vitals/Pain Pain Assessment: Faces Faces Pain Scale: No hurt    Home Living Family/patient expects to be discharged to:: Private residence Living Arrangements: Spouse/significant other Available Help at Discharge: Family;Available 24 hours/day Type of Home: House Home Access: Stairs to enter Entrance Stairs-Rails: Can reach both Home Layout: Two level Home Equipment: None      Prior Function Level of Independence: Independent      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[employer]; umpires for baseball games   PT Goals (  current goals can now be found in the care plan section) Acute Rehab PT Goals Patient Stated Goal: to get back to normal PT Goal Formulation: With patient/family Time For Goal Achievement: 08/23/18 Potential to Achieve Goals: Good Progress towards PT goals:  Progressing toward goals    Frequency    Min 3X/week      PT Plan Current plan remains appropriate    Co-evaluation              AM-PAC PT "6 Clicks" Daily Activity  Outcome Measure  Difficulty turning over in bed (including adjusting bedclothes, sheets and blankets)?: A Little Difficulty moving from lying on back to sitting on the side of the bed? : A Little Difficulty sitting down on and standing up from a chair with arms (e.g., wheelchair, bedside commode, etc,.)?: Unable Help needed moving to and from a bed to chair (including a wheelchair)?: A Little Help needed walking in hospital room?: A Lot Help needed climbing 3-5 steps with a railing? : A Lot 6 Click Score: 14    End of Session   Activity Tolerance: Patient tolerated treatment well Patient left: in bed;with call bell/phone within reach;with family/visitor present Nurse Communication: Mobility status PT Visit Diagnosis: Unsteadiness on feet (R26.81);Difficulty in walking, not elsewhere classified (R26.2)     Time: 1610-9604 PT Time Calculation (min) (ACUTE ONLY): 38 min  Charges:  $Gait Training: 8-22 mins                     08/12/2018  Meeker Bing, PT Acute Rehabilitation Services [phone]  (pager) [phone]  (office)   Eliseo Gum Annalina Needles 08/12/2018, 4:39 PM

## 2018-08-12 NOTE — Progress Notes (Signed)
         [facility]  Date of Admission:  08/08/2018   Total days of antibiotics 5        Day 5 ceftriaxone (BID)        Day 5 vancomycin          Patient ID: Charles Marquez is a 62 y.o. male with  Active Problems:   Acute encephalopathy   Infection of ventricular shunt (HCC)   Bacterial encephalitis   Hyponatremia   Hypokalemia   . atorvastatin  40 mg Oral Daily    SUBJECTIVE: He is doing better today per his wife. Recognized his children today and has some ability to recall what is going on.   No Known Allergies  OBJECTIVE: Vitals:   08/12/18 0700 08/12/18 0728 08/12/18 0800 08/12/18 0900  BP:  120/89 124/85   Pulse: (!) 54 (!) 57 (!) 52 (!) 55  Resp: 16 20 20 17   Temp:   (!) 97.4 F (36.3 C)   TempSrc:   Oral   SpO2: 97% 98% 97% 100%  Weight:      Height:       Body mass index is 26.5 kg/m.  Physical Exam  Constitutional: He is oriented to person, place, and time. He appears well-developed and well-nourished.  Resting in bed. Opens eyes and speaking clearly.   HENT:  Mouth/Throat: Oropharynx is clear and moist and mucous membranes are normal. Normal dentition. No dental abscesses.  Cardiovascular: Normal rate, regular rhythm and normal heart sounds.  Pulmonary/Chest: Effort normal and breath sounds normal.  Abdominal: Soft. He exhibits no distension. There is no tenderness.  Lymphadenopathy:    He has no cervical adenopathy.  Neurological: He is alert and oriented to person, place, and time.  Skin: Skin is warm and dry. No rash noted.  Psychiatric: He has a normal mood and affect. Judgment normal.    Lab Results Lab Results  Component Value Date   WBC 8.3 08/12/2018   HGB 12.1 (L) 08/12/2018   HCT 35.2 (L) 08/12/2018   MCV 91.0 08/12/2018   PLT 185 08/12/2018    Lab Results  Component Value Date   CREATININE 0.78 08/12/2018   BUN 10 08/12/2018   NA 128 (L) 08/12/2018   K 4.0 08/12/2018   CL 98 08/12/2018   CO2 23 08/12/2018    Lab Results  Component Value Date   ALT 14 08/09/2018   AST 17 08/09/2018   ALKPHOS 45 08/09/2018   BILITOT 0.9 08/09/2018     Microbiology: 9/26 CSF >> Moderate Klebsiella Pneumoniae (R-amp) 9/27 CSF >> rare klebsiella pneumoniae  9/29 CSF >> GPC in pairs yet to be identified (glucose < 20, Protein 577, WBC >400)   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 On 9/22 admitted for evaluation of VP shunt malfunction s/p revision and change of his ventricular catheter. Fevers seem to be calming down with treatment and drainage of CSF. GPCs in pairs have not yet been identified. Continue current antibiotics.   PLAN:  1. Please re-sample CSF today or tomorrow  2. CT abdomen/pelvis please to assess distal site.  3. Steroids per nsgy team.  4. Continue vancomycin/ceftriaxone.   Rexene Alberts, MSN, NP-C [facility] for Infectious Disease [facility] Cell: [phone] Pager: [phone]  08/12/2018  10:38 AM

## 2018-08-12 NOTE — Progress Notes (Signed)
Pharmacy Antibiotic Note  Charles Marquez is a 62 y.o. male admitted on 08/08/2018 with nausea and memory loss.  Currently on IV ceftriaxone for encephalitis/shunt infx. CSF shunt growing GPCs in pairs. Pharmacy consulted to restart vancomycin until cultures grow out. Vancomycin trough 12 mcg/ml  Plan: Increase vancomycin 1250 mg IV Q8H Continue Ceftriaxone 2gm IV Q12 F/u renal fxn, C&S, clinical status and trough at SS  Height: 5\' 11"  (180.3 cm) Weight: 190 lb (86.2 kg) IBW/kg (Calculated) : 75.3  Temp (24hrs), Avg:98.6 F (37 C), Min:97.4 F (36.3 C), Max:101 F (38.3 C)  Recent Labs  Lab 08/08/18 1040 08/08/18 1100 08/09/18 0513 08/10/18 0300 08/11/18 0319 08/11/18 1217 08/11/18 1622 08/11/18 2015 08/11/18 2351 08/12/18 0310 08/12/18 2139  WBC 12.9*  --  10.0 9.4 10.1  --   --   --   --  8.3  --   CREATININE 0.95  --  0.93 0.86 0.86 0.88 0.71 0.83 0.79 0.78  --   LATICACIDVEN  --  1.15  --   --   --   --   --   --   --   --   --   VANCOTROUGH  --   --   --   --   --   --   --   --   --   --  12*    Estimated Creatinine Clearance: 104.6 mL/min (by C-G formula based on SCr of 0.78 mg/dL).    No Known Allergies  Antimicrobials this admission: Vanc 9/26>> Cefepime 9/26>>9/28 Rocephin 9/28>>  Dose adjustments this admission: 9/30:  Inc vanc 1250 q8  Microbiology results: 9/27 Wound: Rare GNRs 9/29 CSF: GPCs in pairs 9/29 BCx2>>   Thank you for allowing pharmacy to be a part of this patient's care. Talbert Cage, PharmD Clinical Pharmacist

## 2018-08-12 NOTE — Progress Notes (Signed)
Events of this past few days while I was out of town were reviewed.  Patient status post shunt removal and placement of external ventricular drain.  Patient currently in the ICU.  He is mildly somnolent but will awaken easily.  He is somewhat confused but reorients fairly well.  He is moving all 4 extremities well.  He is somewhat tremulous.  He denies headache.  He is not having any speech difficulty.  His ventricular output remains low.  The CSF is quite thick and proteinaceous but does drain reasonably well when the tube is aspirated.  He is now afebrile.  His vital signs are stable.  He denies any abdominal pain or tenderness.  He is eating without difficulty.  Follow-up head CT scan demonstrates good decompression of his ventricular system with some intraventricular air.  Initial VP shunt aspiration with rare Klebsiella growing.  Follow-up CSF sent yesterday with gram-positive cocci but no gram-negative organisms seen.  (?)  Awaiting further culture results.  CSF white count still elevated at 420.  Protein very high.  Patient with significant ventriculitis.  Unclear at this point whether it is a gram-negative process, gram-positive cocci process, or a mixed problem.  Hopefully secondary culture will better explain ongoing issues.  I believe the patient's decline neurologically is secondary to the ventriculitis and inflammatory cascade interfering with regional CSF production and regional blood flow.  We are going to start him on some steroids to hopefully improve this.  Check follow-up head CT scan in morning.

## 2018-08-13 ENCOUNTER — Inpatient Hospital Stay (HOSPITAL_COMMUNITY): Payer: BLUE CROSS/BLUE SHIELD

## 2018-08-13 ENCOUNTER — Inpatient Hospital Stay: Payer: Self-pay

## 2018-08-13 DIAGNOSIS — H532 Diplopia: Secondary | ICD-10-CM

## 2018-08-13 LAB — CULTURE, BLOOD (ROUTINE X 2)
CULTURE: NO GROWTH
Culture: NO GROWTH
SPECIAL REQUESTS: ADEQUATE

## 2018-08-13 LAB — CSF CELL COUNT WITH DIFFERENTIAL
EOS CSF: 0 % (ref 0–1)
LYMPHS CSF: 3 % — AB (ref 40–80)
MONOCYTE-MACROPHAGE-SPINAL FLUID: 6 % — AB (ref 15–45)
RBC Count, CSF: 7750 /mm3 — ABNORMAL HIGH
Segmented Neutrophils-CSF: 91 % — ABNORMAL HIGH (ref 0–6)
WBC CSF: 1110 /mm3 — AB (ref 0–5)

## 2018-08-13 LAB — BASIC METABOLIC PANEL
ANION GAP: 12 (ref 5–15)
BUN: 14 mg/dL (ref 6–20)
CALCIUM: 8.7 mg/dL — AB (ref 8.9–10.3)
CO2: 22 mmol/L (ref 22–32)
CREATININE: 0.77 mg/dL (ref 0.61–1.24)
Chloride: 101 mmol/L (ref 98–111)
Glucose, Bld: 145 mg/dL — ABNORMAL HIGH (ref 70–99)
Potassium: 4.5 mmol/L (ref 3.5–5.1)
Sodium: 135 mmol/L (ref 135–145)

## 2018-08-13 LAB — GLUCOSE, CAPILLARY: GLUCOSE-CAPILLARY: 200 mg/dL — AB (ref 70–99)

## 2018-08-13 MED ORDER — INSULIN ASPART 100 UNIT/ML ~~LOC~~ SOLN
0.0000 [IU] | Freq: Three times a day (TID) | SUBCUTANEOUS | Status: DC
  Administered 2018-08-14: 3 [IU] via SUBCUTANEOUS
  Administered 2018-08-14 – 2018-08-18 (×5): 2 [IU] via SUBCUTANEOUS
  Administered 2018-08-19: 3 [IU] via SUBCUTANEOUS
  Administered 2018-08-20 – 2018-08-21 (×2): 2 [IU] via SUBCUTANEOUS

## 2018-08-13 MED ORDER — GENTAMICIN SULFATE 10 MG/ML IJ SOLN
8.0000 mg | INTRAMUSCULAR | Status: AC
  Administered 2018-08-13: 8 mg via INTRATHECAL
  Filled 2018-08-13 (×2): qty 0.8

## 2018-08-13 MED ORDER — INSULIN ASPART 100 UNIT/ML ~~LOC~~ SOLN
0.0000 [IU] | Freq: Every day | SUBCUTANEOUS | Status: DC
  Administered 2018-08-15: 2 [IU] via SUBCUTANEOUS

## 2018-08-13 MED FILL — Thrombin For Soln 5000 Unit: CUTANEOUS | Qty: 5000 | Status: AC

## 2018-08-13 NOTE — Progress Notes (Addendum)
   Subjective: No overnight events. Charles Marquez reports that he feels well although he continues to have double vision. He has been working with PT and still has some gait instability as well. He has no new concerns today.  Objective:  Vital signs in last 24 hours: Vitals:   08/13/18 0500 08/13/18 0600 08/13/18 0700 08/13/18 0800  BP: (!) 130/97 (!) 129/93 (!) 131/91   Pulse: 63 (!) 55 (!) 49   Resp: 16 (!) 9 15   Temp:    (!) 97.5 F (36.4 C)  TempSrc:    Oral  SpO2: 96% 95% 97%   Weight:      Height:       Physical exam Gen: lying comfortably in bed, no distress HENT: EVD draining yellow CSF. Neuro: Alert and oriented x3. Gaze-directed nystagmus. Resting tremor. Strength 5/5 throughout. No dysmetria on cerebellar testing. Normoreflexic. Ext: No lower extremity edema Skin: warm and dry. New acneiform eruption over the back likely due to positioning, sweat, and steroids.  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 He was transitioned to ceftriaxone from cefepime for better CNS coverage given the sensitivity results of the specimen on 08/10/2018. Vancomycin was later added when Gram positive cocci in pairs grew in his CSF.  Encephalitis and communicating hydrocephalus - Patient continues to feel well and neuro exam is unchanged from yesterday - Repeat CSF cultures today.  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 His Na improved from 128 yesterday to 135 today. Will continue fluid restriction.  Plan - am BMP - fluid restriction  Dispo: Anticipated discharge in approximately however many days until he is stable.   Dorrell, Cathleen Corti, MD 08/13/2018, 11:23 AM Pager: [phone]

## 2018-08-13 NOTE — Progress Notes (Signed)
Noted PICC order.  ID seeing patient and waiting on pending cultures.  Working PIV at present.  Will await cultures and ID follow up.  Plan on placing tomorrow.

## 2018-08-13 NOTE — Progress Notes (Signed)
Physical Therapy Treatment Patient Details Name: Charles Marquez MRN: [id number] DOB: November 07, 1956 Today's Date: 08/13/2018    History of Present Illness 62yo male prsenting with N/V, unsteady gait, memory impairments. Note recent hx of VP shunt placement, and later shunt revision on 08/05/18. He was feeling better after the revision but then these symptoms started. CT shows ongoing hydrocephalus but it is improved from 08/04/18. PMH anxiety, TIA, L UE fx, VP shunt placement, VP shunt revision     PT Comments    Pt is improving with mentation and general mobility.  He is still quite unsteady during gait,worsening with scanning, cadence and directional changes.   Follow Up Recommendations  CIR;Supervision/Assistance - 24 hour;Other (comment)     Equipment Recommendations  Other (comment)(TBA)    Recommendations for Other Services       Precautions / Restrictions Precautions Precautions: Fall;Other (comment) Precaution Comments: double vision without patched glasses    Mobility  Bed Mobility               General bed mobility comments: OOB in the chair on arrival  Transfers Overall transfer level: Needs assistance   Transfers: Sit to/from Stand Sit to Stand: Min assist         General transfer comment: stability assist only  Ambulation/Gait Ambulation/Gait assistance: Mod assist;+2 safety/equipment Gait Distance (Feet): 150 Feet(x3 with standing rests to recover) Assistive device: IV Pole;None Gait Pattern/deviations: Staggering left;Staggering right;Drifts right/left;Scissoring;Festinating   Gait velocity interpretation: 1.31 - 2.62 ft/sec, indicative of limited community ambulator General Gait Details: unsteady overall with frequent episodes of staggering or wandering to recover significant deviation or LOB.  Scanning worsened the deviation as did cadence change and abrupt directional changes   Stairs             Wheelchair Mobility    Modified  Rankin (Stroke Patients Only)       Balance Overall balance assessment: Needs assistance   Sitting balance-Leahy Scale: Fair     Standing balance support: Bilateral upper extremity supported Standing balance-Leahy Scale: Poor Standing balance comment: left lean with static standing, pt reaching for iv pole or rail                             Cognition Arousal/Alertness: Awake/alert Behavior During Therapy: WFL for tasks assessed/performed Overall Cognitive Status: Within Functional Limits for tasks assessed(mild impairments not tested formally)                                        Exercises      General Comments General comments (skin integrity, edema, etc.): pt/family and nursing asked to walk with pt 2 or more times per day.      Pertinent Vitals/Pain Faces Pain Scale: No hurt    Home Living                      Prior Function            PT Goals (current goals can now be found in the care plan section) Acute Rehab PT Goals Patient Stated Goal: to get back to normal PT Goal Formulation: With patient/family Time For Goal Achievement: 08/23/18 Potential to Achieve Goals: Good Progress towards PT goals: Progressing toward goals    Frequency    Min 3X/week      PT Plan Current plan  remains appropriate    Co-evaluation              AM-PAC PT "6 Clicks" Daily Activity  Outcome Measure  Difficulty turning over in bed (including adjusting bedclothes, sheets and blankets)?: A Little Difficulty moving from lying on back to sitting on the side of the bed? : A Little Difficulty sitting down on and standing up from a chair with arms (e.g., wheelchair, bedside commode, etc,.)?: A Little Help needed moving to and from a bed to chair (including a wheelchair)?: A Little Help needed walking in hospital room?: A Lot Help needed climbing 3-5 steps with a railing? : A Lot 6 Click Score: 16    End of Session   Activity  Tolerance: Patient tolerated treatment well Patient left: in bed;with call bell/phone within reach;with family/visitor present Nurse Communication: Mobility status PT Visit Diagnosis: Unsteadiness on feet (R26.81);Difficulty in walking, not elsewhere classified (R26.2)     Time: 1610-9604 PT Time Calculation (min) (ACUTE ONLY): 22 min  Charges:  $Gait Training: 8-22 mins                     08/13/2018  [place] Bing, PT Acute Rehabilitation Services [phone]  (pager) [phone]  (office)   Eliseo Gum Carla Rashad 08/13/2018, 4:39 PM

## 2018-08-13 NOTE — Progress Notes (Signed)
[facility]  [facility] Infusion Coordinator will follow pt with ID team to support Home Infusion Pharmacy services for home IV ABX as ordered at DC.  If patient discharges after hours, please call [phone].   Sedalia Muta 08/13/2018, 10:10 AM

## 2018-08-13 NOTE — Progress Notes (Signed)
Patient improved neurologically.  Much more awake and oriented.  Speech is fluent.  Better stability on feet.motor 5/5 bilaterally.  He has remained afebrile.  His ventricular output is minimal.  Fluid was sent to the lab today.  The foot is very turbid.  Cell count remains very elevated.  Cultures pending.  Head CT scan with decreased ventricular size.  No evidence of worsening hydrocephalus however patient with increased signal throughout his ventricles consistent with debris within the CSF.  Patient with a gram-negative ventricul.  Fluid very turbid and slow to clear.  Begin intraventricular gentamicin in hopes of better treating the infection.  Continue steroids for now.

## 2018-08-13 NOTE — Progress Notes (Signed)
[facility]  Date of Admission:  08/08/2018   Total days of antibiotics 6        Day 6 ceftriaxone (BID)        Day 6 vancomycin          Patient ID: Charles Marquez is a 62 y.o. male with  Active Problems:   Acute encephalopathy   Infection of ventricular shunt (HCC)   Bacterial encephalitis   Hyponatremia   Hypokalemia   . atorvastatin  40 mg Oral Daily  . dexamethasone  10 mg Intravenous Q6H    SUBJECTIVE: Up in the chair smiling. Recognized me from yesterday. Has no complaints today but describes blurry vision to be ongoing. No fevers overnight.   No Known Allergies  OBJECTIVE: Vitals:   08/13/18 0500 08/13/18 0600 08/13/18 0700 08/13/18 0800  BP: (!) 130/97 (!) 129/93 (!) 131/91   Pulse: 63 (!) 55 (!) 49   Resp: 16 (!) 9 15   Temp:    (!) 97.5 F (36.4 C)  TempSrc:    Oral  SpO2: 96% 95% 97%   Weight:      Height:       Body mass index is 26.5 kg/m.  Physical Exam  Constitutional: He is oriented to person, place, and time. He appears well-developed and well-nourished.  Resting in recliner. Awake and appropriate. Smiling.   HENT:  Mouth/Throat: Oropharynx is clear and moist and mucous membranes are normal. Normal dentition. No dental abscesses.  Honeycomb dressing intact to EVD. Yellow/straw colored CSF in bag.   Eyes: Pupils are equal, round, and reactive to light.  Cardiovascular: Normal rate, regular rhythm and normal heart sounds.  Pulmonary/Chest: Effort normal and breath sounds normal.  Abdominal: Soft. He exhibits no distension. There is no tenderness.  Lymphadenopathy:    He has no cervical adenopathy.  Neurological: He is alert and oriented to person, place, and time.  Skin: Skin is warm and dry. No rash noted.  Psychiatric: He has a normal mood and affect. Judgment normal.    Lab Results Lab Results  Component Value Date   WBC 8.3 08/12/2018   HGB 12.1 (L) 08/12/2018   HCT 35.2 (L) 08/12/2018   MCV 91.0 08/12/2018   PLT 185 08/12/2018    Lab Results  Component Value Date   CREATININE 0.77 08/13/2018   BUN 14 08/13/2018   NA 135 08/13/2018   K 4.5 08/13/2018   CL 101 08/13/2018   CO2 22 08/13/2018    Lab Results  Component Value Date   ALT 14 08/09/2018   AST 17 08/09/2018   ALKPHOS 45 08/09/2018   BILITOT 0.9 08/09/2018     Microbiology: 9/26 CSF >> Moderate Klebsiella Pneumoniae (R-amp) 9/27 CSF >> Rare klebsiella pneumoniae  9/29 CSF >> GPC in pairs on stain but no growth (glucose < 20, Protein 577, WBC >400)  Imaging:  CT A/P with Contrast 9/30 IMPRESSION: 1. Ventricular shunt enters the upper central abdomen courses inferiorly to left of midline and then superiorly to just below the left hemidiaphragm. At the level of the tip of the catheter, there is a 4.4 x 2.2 x 2 cm fluid collection which may represent a small abscess or loculated sterile fluid collection. 2. Persistent left base parenchymal changes may represent atelectasis or residua of infiltrate. Slight increase in size of adjacent small left-sided pleural effusion. 3. Slightly heterogeneous appearance of kidneys on delayed imaging. Question pyelonephritis. Radiopaque material  within the dependent aspect of the urinary bladder may represent debris from infection. Cannot exclude primary bladder wall abnormality given this appearance. 4. Haziness of fat planes throughout the abdomen and pelvis however, this does not appear to be centered around bowel to suggest primary bowel inflammatory process. 5. 5 mm nonspecific low-density lesion within the spleen. 6. Enlarged liver.  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 Prior to this admission he was on 9/22 admitted for evaluation of VP shunt malfunction and underwent revision and change of his ventricular catheter. Fevers have stopped with addition of steroids. GPCs in pairs are not  growing, would prefer to continue dual coverage with ceftriaxone and vancomycin however. Repeat CSF indices and gs/culture today. Head CT revealed some areas of worsened hydrocephalous with partial resolution of lateral compartments. CT of the abdomen revealing new fluid collection (was not referenced on 9/23 operative note) that is suspicious for abscess - will likely need intervention to remove distal shunt and drain fluid collection. He overall looks much improved today.   PLAN:  1. CSF to be re-sampled today for cell count, gluc/prot and gram stain/cx 2. Steroids per nsgy team  3. Continue vancomycin/ceftriaxone 4. General surgery consultation for consideration of removal of intra-abdominal fluid collection and removal of distal shunt components  Rexene Alberts, MSN, NP-C [facility] Infectious [facility] Cell: [phone] Pager: [phone]  08/13/2018  10:33 AM

## 2018-08-14 DIAGNOSIS — G0490 Encephalitis and encephalomyelitis, unspecified: Secondary | ICD-10-CM

## 2018-08-14 LAB — VANCOMYCIN, TROUGH: VANCOMYCIN TR: 22 ug/mL — AB (ref 15–20)

## 2018-08-14 LAB — BASIC METABOLIC PANEL
Anion gap: 10 (ref 5–15)
BUN: 19 mg/dL (ref 6–20)
CALCIUM: 8.5 mg/dL — AB (ref 8.9–10.3)
CO2: 25 mmol/L (ref 22–32)
CREATININE: 0.9 mg/dL (ref 0.61–1.24)
Chloride: 102 mmol/L (ref 98–111)
GFR calc Af Amer: >60 mL/min (ref >60)
GLUCOSE: 154 mg/dL — AB (ref 70–99)
Potassium: 3.8 mmol/L (ref 3.5–5.1)
Sodium: 137 mmol/L (ref 135–145)

## 2018-08-14 LAB — GLUCOSE, CAPILLARY
GLUCOSE-CAPILLARY: 140 mg/dL — AB (ref 70–99)
GLUCOSE-CAPILLARY: 118 mg/dL — AB (ref 70–99)
Glucose-Capillary: 160 mg/dL — ABNORMAL HIGH (ref 70–99)
Glucose-Capillary: 129 mg/dL — ABNORMAL HIGH (ref 70–99)

## 2018-08-14 LAB — CSF CULTURE W GRAM STAIN: Culture: NO GROWTH

## 2018-08-14 MED ORDER — VANCOMYCIN HCL 10 G IV SOLR
1250.0000 mg | Freq: Two times a day (BID) | INTRAVENOUS | Status: DC
  Administered 2018-08-14 – 2018-08-16 (×4): 1250 mg via INTRAVENOUS
  Filled 2018-08-14 (×4): qty 1250

## 2018-08-14 MED ORDER — SODIUM CHLORIDE 0.9% FLUSH
10.0000 mL | Freq: Two times a day (BID) | INTRAVENOUS | Status: DC
  Administered 2018-08-15 – 2018-08-17 (×4): 10 mL
  Administered 2018-08-18: 20 mL
  Administered 2018-08-18: 10 mL
  Administered 2018-08-19: 20 mL
  Administered 2018-08-19 – 2018-08-21 (×4): 10 mL

## 2018-08-14 MED ORDER — SODIUM CHLORIDE 0.9% FLUSH: 10.0000 mL | INTRAVENOUS | Status: DC | PRN

## 2018-08-14 MED ORDER — DEXAMETHASONE SODIUM PHOSPHATE 10 MG/ML IJ SOLN
4.0000 mg | Freq: Four times a day (QID) | INTRAMUSCULAR | Status: DC
  Administered 2018-08-14 – 2018-08-16 (×8): 4 mg via INTRAVENOUS
  Filled 2018-08-14 (×8): qty 1

## 2018-08-14 MED ORDER — CHLORHEXIDINE GLUCONATE CLOTH 2 % EX PADS
6.0000 | MEDICATED_PAD | Freq: Every day | CUTANEOUS | Status: DC
  Administered 2018-08-15 – 2018-08-20 (×6): 6 via TOPICAL

## 2018-08-14 NOTE — Progress Notes (Signed)
Occupational Therapy Treatment Patient Details Name: Charles Marquez MRN: [id number] DOB: 20-Oct-1956 Today's Date: 08/14/2018    History of present illness 62 y.o. 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  Pt dx with acute encepolopathy and fever (102).  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  Pt was suspected to have infection.  Neurosurgery consulted and tapped shunt.  CT also showed some lung changes supicious for PNA. Infectious disease following for ventriculits. Neurosurgery surgically removed his shunt and placed a ventricular catheter on 08/12/18.     OT comments  Pt making steady progress towards OT goals, presents supine in bed pleasant and willing to participate in therapy session. Pt completing functional mobility using single UE support and overall minA+2. Pt demonstrating improvements in awareness during mobility completion and able to recognize when listing towards one direction. Pt continues to report fluctuating diplopia, use of visual occlusion glasses during session to decrease symptoms. Feel pt remains excellent candidate for CIR level services at time of discharge. Will continue to follow acutely to progress pt towards established OT goals.   Follow Up Recommendations  CIR;Supervision/Assistance - 24 hour    Equipment Recommendations  3 in 1 bedside commode          Precautions / Restrictions Precautions Precautions: Fall;Other (comment) Precaution Comments: double vision changes during session, most consistantly with far distance vision, but at end of session aslo with near distance vision.  Required Braces or Orthoses: Other Brace/Splint Other Brace/Splint: ventric drain, check with RN before seeing to see if it needs to be clamped (he has been unclamped for some of his sessions on purpose).  Restrictions Weight Bearing Restrictions: No       Mobility Bed  Mobility Overal bed mobility: Needs Assistance Bed Mobility: Supine to Sit     Supine to sit: Supervision     General bed mobility comments: Supervision for safety  Transfers Overall transfer level: Needs assistance Equipment used: None Transfers: Sit to/from Stand Sit to Stand: Min assist         General transfer comment: Min assist for safety    Balance Overall balance assessment: Needs assistance Sitting-balance support: Feet supported;Bilateral upper extremity supported Sitting balance-Leahy Scale: Fair     Standing balance support: Bilateral upper extremity supported;Single extremity supported Standing balance-Leahy Scale: Poor Standing balance comment: left lateral lean and narrow base in static standing.  Min assist statically, heavier min assist dynamically.                            ADL either performed or assessed with clinical judgement   ADL Overall ADL's : Needs assistance/impaired                     Lower Body Dressing: Minimal assistance;Sit to/from stand               Functional mobility during ADLs: Minimal assistance;+2 for physical assistance;+2 for safety/equipment General ADL Comments: pt completing functional mobility with single UE and practiced without UE support, use of heavy minA+2 during mobility, decreased lateral lean (L) noted this session     Vision       Perception     Praxis      Cognition Arousal/Alertness: Awake/alert Behavior During Therapy: WFL for tasks assessed/performed Overall Cognitive Status: Impaired/Different from baseline Area of Impairment: Awareness  General Comments: Significant improvements with awareness and safety/impulsivity during mobility. Pt able to recall what he ate for lunch earlier today given increased time to recall        Exercises     Shoulder Instructions       General Comments Wife assisting and participating in  session.     Pertinent Vitals/ Pain       Pain Assessment: No/denies pain  Home Living                                          Prior Functioning/Environment              Frequency  Min 2X/week        Progress Toward Goals  OT Goals(current goals can now be found in the care plan section)     Acute Rehab OT Goals Patient Stated Goal: to get back to normal OT Goal Formulation: With patient/family Time For Goal Achievement: 08/26/18 Potential to Achieve Goals: Good ADL Goals Pt Will Perform Lower Body Bathing: with modified independence;sit to/from stand Pt Will Perform Lower Body Dressing: with modified independence;sit to/from stand Pt Will Transfer to Toilet: with modified independence;ambulating Pt Will Perform Toileting - Clothing Manipulation and hygiene: with modified independence;sit to/from stand  Plan Discharge plan remains appropriate    Co-evaluation    PT/OT/SLP Co-Evaluation/Treatment: Yes Reason for Co-Treatment: For patient/therapist safety;To address functional/ADL transfers   OT goals addressed during session: Proper use of Adaptive equipment and DME;Strengthening/ROM      AM-PAC PT "6 Clicks" Daily Activity     Outcome Measure   Help from another person eating meals?: None Help from another person taking care of personal grooming?: A Little Help from another person toileting, which includes using toliet, bedpan, or urinal?: A Little Help from another person bathing (including washing, rinsing, drying)?: A Little Help from another person to put on and taking off regular upper body clothing?: A Little Help from another person to put on and taking off regular lower body clothing?: A Little 6 Click Score: 19    End of Session Equipment Utilized During Treatment: Gait belt  OT Visit Diagnosis: Other abnormalities of gait and mobility (R26.89);Other symptoms and signs involving cognitive function;Other (comment)(diplopia)    Activity Tolerance Patient tolerated treatment well   Patient Left in chair;with call bell/phone within reach;with family/visitor present   Nurse Communication Mobility status        Time: 1426-1450 OT Time Calculation (min): 24 min  Charges: OT General Charges $OT Visit: 1 Visit OT Treatments $Therapeutic Activity: 8-22 mins  Marcy Siren, OT Supplemental Rehabilitation Services Pager [phone] Office [phone]   Orlando Penner 08/14/2018, 5:01 PM

## 2018-08-14 NOTE — Progress Notes (Addendum)
Medicine attending: I examined this patient today together with resident physician Dr Jaynie Bream and I concur with her evaluation and management plan which we discussed together.  Wife reports that the patient had some transient confusion last evening.  Not clear whether this was a transient result of a dose of intrathecal gentamicin started yesterday.  He is fully alert, awake, oriented, and with no focal neurologic deficits on today's exam except for residual mild lateral nystagmus.  He remains afebrile on current antibiotics.  Sodium level remains normal. Blood sugars acceptable on high-dose dexamethasone using regular insulin coverage as needed.  Impression: 1.  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 2.  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 Plan: Continue current management plan.  We will ask surgery if we can decrease his Decadron dose.  There is really no significant dose response curve on Decadron doses over 16-20 mg daily. I do not think increase CSF leukocytosis is related to his steroids.  Neutrophils do not marginate in the CSF as I do in the peripheral blood as far as I am aware.

## 2018-08-14 NOTE — Care Management Note (Signed)
Case Management Note  Patient Details  Name: Charles Marquez MRN: [id number] Date of Birth: October 28, 1956  Subjective/Objective:                  62 y.o. 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  Pt dx with acute encepolopathy and fever (102).  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  Pt was suspected to have infection.  Neurosurgery consulted and tapped shunt.  CT also showed some lung changes supicious for PNA. Infectious disease following for ventriculits. Neurosurgery surgically removed his shunt and placed a ventricular catheter on 08/12/18.  PTA, pt independent, lives with spouse.    Action/Plan: PT/OT recommending CIR at this time, though pt may advance to home with [facility] care.  Will follow progress.  Will likely need long term antibiotics.    Expected Discharge Date:                  Expected Discharge Plan:  Home w Home Health Services  In-House Referral:     Discharge planning Services  CM Consult  Post Acute Care Choice:    Choice offered to:     DME Arranged:    DME Agency:     HH Arranged:    HH Agency:     Status of Service:  In process, will continue to follow  If discussed at Long Length of Stay Meetings, dates discussed:    Additional Comments:  Quintella Baton, RN, BSN  Trauma/Neuro ICU Case Manager [phone]

## 2018-08-14 NOTE — Progress Notes (Addendum)
   Subjective: No overnight events. Charles Marquez reports that he continues to feel well, but has double vision. His wife reports that he seemed more confused last night after receiving his first dose of intraventricular gentamicin, but that he seems to be back to normal this morning. He denies any new symptoms or concerns.   Objective:  Vital signs in last 24 hours: Vitals:   08/14/18 0300 08/14/18 0400 08/14/18 0500 08/14/18 0600  BP: 129/85 131/72 125/79 129/84  Pulse: (!) 57 (!) 55 (!) 55 (!) 56  Resp: 15 14 10 12   Temp:  (!) 97.5 F (36.4 C)    TempSrc:  Oral    SpO2: 96% 95% 96% 95%  Weight:      Height:       Physical exam Gen: lying comfortably in bed, no distress HENT: EVD draining turbid yellow CSF. Neuro: Alert and oriented x3. Gaze-directed nystagmus. Resting tremor. Strength 5/5 throughout. No dysmetria on cerebellar testing. Normoreflexic. Ext: No lower extremity edema Skin: warm and dry. Acneiform eruption over the back likely due to positioning, sweat, and steroids.  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 He was transitioned to ceftriaxone from cefepime for better CNS coverage given the sensitivity results of the specimen on 08/10/2018.Vancomycin was later added when Gram positive cocci in pairs grew in his CSF. Started on intraventricular gentamicin on 10/1 for ongoing high cell count in the CSF.  Encephalitis/ventriculitisand communicating hydrocephalus - Patient continues to feel well and neuro  exam is unchanged from yesterday - Repeat CSF cultures from 10/1 have shown no growth to date. Fungal culture from 9/29 negative. - Continuing ceftriaxone, vanc, and intraventricular gentamicin. Few guidelines exist for monitoring intraventricular gentamicin for systemic side effects. Will continue to trend BMP to follow renal function. - Decadron decreased from 10mg  q6hrs to 4mg  q6hrs. Continuing Keppra. Plan - Continue management per neurosurgery and ID teams  Hyperglycemia - No history of DM. Hyperglycemia secondary to high-dose steroids. Decadron decreased to 4mg  q6hrs.  Plan - CBG QID - SSI  Hyponatremia - Most likely SIADH given patient's intracranial disease processes. He is normonatremic at 137 today.  Plan - am BMP - liberalize fluid restriction to 2L/day  Dispo: Anticipated discharge in approximately however many days until he is stable.  Dorrell, Cathleen Corti, MD 08/14/2018, 6:39 AM Pager: [phone]

## 2018-08-14 NOTE — Progress Notes (Signed)
Physical Therapy Treatment Patient Details Name: Charles Marquez MRN: [id number] DOB: 02/26/1956 Today's Date: 08/14/2018    History of Present Illness 62 y.o. 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  Pt dx with acute encepolopathy and fever (102).  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  Pt was suspected to have infection.  Neurosurgery consulted and tapped shunt.  CT also showed some lung changes supicious for PNA. Infectious disease following for ventriculits. Neurosurgery surgically removed his shunt and placed a ventricular catheter on 08/12/18.      PT Comments    Pt showing anticipatory awareness of his left sided tilt in standing and with gait.  He prefers to hold the IV pole as when he is made to let go of it during gait he slows his speed significantly and his left lateral lean is much more prominent.  He is progressing well and can likely be a one person assist next session.  His family and friends are also very hands on and helpful.  PT will continue to follow acutely for safe mobility progression  Follow Up Recommendations  CIR;Supervision/Assistance - 24 hour;Other (comment)     Equipment Recommendations  Cane    Recommendations for Other Services   NA     Precautions / Restrictions Precautions Precautions: Fall;Other (comment) Precaution Comments: double vision changes during session, most consistantly with far distance vision, but at end of session aslo with near distance vision.  Required Braces or Orthoses: Other Brace/Splint Other Brace/Splint: ventric drain, check with RN before seeing to see if it needs to be clamped (he has been unclamped for some of his sessions on purpose).     Mobility  Bed Mobility Overal bed mobility: Needs Assistance Bed Mobility: Supine to Sit     Supine to sit: Supervision     General bed mobility comments: Supervision for  safety  Transfers Overall transfer level: Needs assistance Equipment used: None Transfers: Sit to/from Stand Sit to Stand: Min assist         General transfer comment: Min assist for safety  Ambulation/Gait Ambulation/Gait assistance: Min assist;+2 physical assistance Gait Distance (Feet): 130 Feet Assistive device: IV Pole;1 person hand held assist;None Gait Pattern/deviations: Staggering left   Gait velocity interpretation: 1.31 - 2.62 ft/sec, indicative of limited community ambulator General Gait Details: Pt doing better today at anticipating turns and self correcting his left lateral lean.  He started holding IV pole and therapists on his free hand side and looked good, therapist then made him drop his hand from the IV pole and his gait speed slowed significantly and his left lateral lean increased.            Balance Overall balance assessment: Needs assistance Sitting-balance support: Feet supported;Bilateral upper extremity supported Sitting balance-Leahy Scale: Fair     Standing balance support: Bilateral upper extremity supported;Single extremity supported Standing balance-Leahy Scale: Poor Standing balance comment: left lateral lean and narrow base in static standing.  Min assist statically, heavier min assist dynamically.                             Cognition Arousal/Alertness: Awake/alert Behavior During Therapy: WFL for tasks assessed/performed  General Comments: Significant improvements with awareness and safety/impulsivity.       Exercises      General Comments General comments (skin integrity, edema, etc.): Wife assisting and participating in session.       Pertinent Vitals/Pain Pain Assessment: No/denies pain           PT Goals (current goals can now be found in the care plan section) Acute Rehab PT Goals Patient Stated Goal: to get back to normal Progress towards PT goals:  Progressing toward goals    Frequency    Min 3X/week      PT Plan Current plan remains appropriate       AM-PAC PT "6 Clicks" Daily Activity  Outcome Measure  Difficulty turning over in bed (including adjusting bedclothes, sheets and blankets)?: A Little Difficulty moving from lying on back to sitting on the side of the bed? : A Little Difficulty sitting down on and standing up from a chair with arms (e.g., wheelchair, bedside commode, etc,.)?: Unable Help needed moving to and from a bed to chair (including a wheelchair)?: A Little Help needed walking in hospital room?: A Little Help needed climbing 3-5 steps with a railing? : A Little 6 Click Score: 16    End of Session Equipment Utilized During Treatment: Gait belt Activity Tolerance: Patient tolerated treatment well Patient left: in chair;with call bell/phone within reach;with family/visitor present   PT Visit Diagnosis: Unsteadiness on feet (R26.81);Difficulty in walking, not elsewhere classified (R26.2)     Time: 1610-9604 PT Time Calculation (min) (ACUTE ONLY): 24 min  Charges:  $Gait Training: 8-22 mins                    Frayda Egley B. Raman Featherston, PT, DPT  Acute Rehabilitation [phone] pager #[phone] office   08/14/2018, 4:37 PM

## 2018-08-14 NOTE — Progress Notes (Signed)
Peripherally Inserted Central Catheter/Midline Placement  The IV Nurse has discussed with the patient and/or persons authorized to consent for the patient, the purpose of this procedure and the potential benefits and risks involved with this procedure.  The benefits include less needle sticks, lab draws from the catheter, and the patient may be discharged home with the catheter. Risks include, but not limited to, infection, bleeding, blood clot (thrombus formation), and puncture of an artery; nerve damage and irregular heartbeat and possibility to perform a PICC exchange if needed/ordered by physician.  Alternatives to this procedure were also discussed.  Bard Power PICC patient education guide, fact sheet on infection prevention and patient information card has been provided to patient /or left at bedside.    PICC/Midline Placement Documentation  PICC Single Lumen 08/14/18 PICC Right Brachial 44 cm 0 cm (Active)  Indication for Insertion or Continuance of Line Home intravenous therapies (PICC only) 08/14/2018 12:33 PM  Exposed Catheter (cm) 0 cm 08/14/2018 12:33 PM  Site Assessment Clean;Dry;Intact 08/14/2018 12:33 PM  Line Status Flushed;Saline locked;Blood return noted 08/14/2018 12:33 PM  Dressing Type Transparent;Securing device 08/14/2018 12:33 PM  Dressing Status Clean;Dry;Intact;Antimicrobial disc in place 08/14/2018 12:33 PM  Dressing Change Due 08/21/18 08/14/2018 12:33 PM       Romie Jumper 08/14/2018, 12:36 PM

## 2018-08-14 NOTE — Progress Notes (Signed)
Pharmacy Antibiotic Note  Charles Marquez is a 62 y.o. male admitted on 08/08/2018 with nausea and memory loss. Continue on IV ceftriaxone and vancomycin. A vancomycin trough today is slightly above goal at 22.   Plan: Change vancomycin to 1250mg  IV Q12H F/u renal fxn, C&S, clinical status and trough at [facility] ID recommends 7 days of therapy  Height: 5\' 11"  (180.3 cm) Weight: 190 lb (86.2 kg) IBW/kg (Calculated) : 75.3  Temp (24hrs), Avg:97.6 F (36.4 C), Min:97.5 F (36.4 C), Max:97.7 F (36.5 C)  Recent Labs  Lab 08/08/18 1040 08/08/18 1100 08/09/18 0513 08/10/18 0300 08/11/18 0319  08/11/18 2015 08/11/18 2351 08/12/18 0310 08/12/18 2139 08/13/18 0347 08/14/18 0309 08/14/18 1255  WBC 12.9*  --  10.0 9.4 10.1  --   --   --  8.3  --   --   --   --   CREATININE 0.95  --  0.93 0.86 0.86   < > 0.83 0.79 0.78  --  0.77 0.90  --   LATICACIDVEN  --  1.15  --   --   --   --   --   --   --   --   --   --   --   VANCOTROUGH  --   --   --   --   --   --   --   --   --  12*  --   --  22*   < > = values in this interval not displayed.    Estimated Creatinine Clearance: 93 mL/min (by C-G formula based on SCr of 0.9 mg/dL).    No Known Allergies  Antimicrobials this admission: Vanc 9/26>>9/27>>9/29>> Cefepime 9/26>>9/28 Rocephin 9/28>>  Microbiology results: 9/29 BCx2>>ngtd  9/29 CSF >> NGTD 9/26 BC x 2 >>NEG 9/26 CSF shunt - > Klebsiella pneumonia  9/27 Wound -> Klebsiella pneumonia  9/26 urine - > NG MRSA PCR - NEG  Lysle Pearl, PharmD, BCPS Please see AMION for all pharmacy numbers 08/14/2018 2:02 PM

## 2018-08-14 NOTE — Progress Notes (Signed)
Overall stable.  No new issues or problems overnight.  Currently this morning the patient sitting up conversing appropriately with minimal if any confusion.  Double vision also seems to be improved today.  He is afebrile.  His vital signs are stable.  His sodium was better yesterday.  His ventriculostomy continues with minimal drainage.  He is awake and alert.  He is oriented and appropriate.  Motor and sensory function intact.  CSF somewhat less turbid today.  Overall stable.  Initial cultures grew Klebsiella.  Patient on Rocephin and receiving intraventricular gentamicin for this.  Patient still on vancomycin for reported gram-positive cocci in CSF 3 days ago.  That cultures no growth so far.  Continue vancomycin for now.  I have diminished his Decadron to 4 mg every 6.  We will continue with daily intraventricular gentamicin for 3 to 5 days.  Plan on sending CSF sample again tomorrow.

## 2018-08-14 NOTE — Progress Notes (Signed)
INFECTIOUS DISEASE PROGRESS NOTE  ID: Charles Marquez is a 62 y.o. male with  Active Problems:   Acute encephalopathy   Infection of ventricular shunt (HCC)   Bacterial encephalitis   Hyponatremia   Hypokalemia  Subjective: Less diplopia.   Abtx:  Anti-infectives (From admission, onward)   Start     Dose/Rate Route Frequency Ordered Stop   08/13/18 1500  gentamicin (PF) (GARAMYCIN) injection 8 mg     8 mg Intrathecal STAT 08/13/18 1446 08/13/18 1656   08/11/18 2200  vancomycin (VANCOCIN) IVPB 1000 mg/200 mL premix     1,000 mg 200 mL/hr over 60 Minutes Intravenous Every 8 hours 08/11/18 1350     08/11/18 1415  vancomycin (VANCOCIN) 1,500 mg in sodium chloride 0.9 % 500 mL IVPB     1,500 mg 250 mL/hr over 120 Minutes Intravenous  Once 08/11/18 1350 08/11/18 1613   08/10/18 1600  cefTRIAXone (ROCEPHIN) 2 g in sodium chloride 0.9 % 100 mL IVPB     2 g 200 mL/hr over 30 Minutes Intravenous Every 12 hours 08/10/18 1025     08/09/18 1427  bacitracin 50,000 Units in sodium chloride 0.9 % 500 mL irrigation  Status:  Discontinued       As needed 08/09/18 1427 08/09/18 1639   08/09/18 0000  vancomycin (VANCOCIN) IVPB 1000 mg/200 mL premix  Status:  Discontinued     1,000 mg 200 mL/hr over 60 Minutes Intravenous Every 8 hours 08/08/18 1534 08/09/18 0952   08/08/18 2330  ceFEPIme (MAXIPIME) 2 g in sodium chloride 0.9 % 100 mL IVPB  Status:  Discontinued     2 g 200 mL/hr over 30 Minutes Intravenous Every 8 hours 08/08/18 1534 08/10/18 1025   08/08/18 1515  ceFEPIme (MAXIPIME) 2 g in sodium chloride 0.9 % 100 mL IVPB     2 g 200 mL/hr over 30 Minutes Intravenous  Once 08/08/18 1509 08/08/18 1832   08/08/18 1515  vancomycin (VANCOCIN) 1,500 mg in sodium chloride 0.9 % 500 mL IVPB  Status:  Discontinued     1,500 mg 250 mL/hr over 120 Minutes Intravenous  Once 08/08/18 1509 08/09/18 1223      Medications:  Scheduled: . atorvastatin  40 mg Oral Daily  . dexamethasone  4 mg  Intravenous Q6H  . insulin aspart  0-15 Units Subcutaneous TID WC  . insulin aspart  0-5 Units Subcutaneous QHS    Objective: Vital signs in last 24 hours: Temp:  [97.5 F (36.4 C)-97.8 F (36.6 C)] 97.6 F (36.4 C) (10/02 0800) Pulse Rate:  [55-69] 58 (10/02 1000) Resp:  [10-23] 14 (10/02 1000) BP: (100-131)/(62-93) 128/90 (10/02 1000) SpO2:  [94 %-98 %] 97 % (10/02 1000)   General appearance: alert, cooperative and no distress Head: shunt site is clean, non-tender.  Resp: clear to auscultation bilaterally Cardio: regular rate and rhythm GI: normal findings: bowel sounds normal and soft, non-tender  Lab Results Recent Labs    08/12/18 0310 08/13/18 0347 08/14/18 0309  WBC 8.3  --   --   HGB 12.1*  --   --   HCT 35.2*  --   --   NA 128* 135 137  K 4.0 4.5 3.8  CL 98 101 102  CO2 23 22 25   BUN 10 14 19   CREATININE 0.78 0.77 0.90   Liver Panel No results for input(s): PROT, ALBUMIN, AST, ALT, ALKPHOS, BILITOT, BILIDIR, IBILI in the last 72 hours. Sedimentation Rate Recent Labs    08/12/18  1135  ESRSEDRATE 52*   C-Reactive Protein Recent Labs    08/12/18 1135  CRP 1.9*    Microbiology: Recent Results (from the past 240 hour(s))  Surgical pcr screen     Status: None   Collection Time: 08/04/18 10:15 PM  Result Value Ref Range Status   MRSA, PCR NEGATIVE NEGATIVE Final   Staphylococcus aureus NEGATIVE NEGATIVE Final    Comment: (NOTE) The Xpert SA Assay (FDA approved for NASAL specimens in patients 69 years of age and older), is one component of a comprehensive surveillance program. It is not intended to diagnose infection nor to guide or monitor treatment. Performed at [facility], [address], [place], [place] [address]   Blood Culture (routine x 2)     Status: None   Collection Time: 08/08/18 10:40 AM  Result Value Ref Range Status   Specimen Description BLOOD RIGHT ARM  Final   Special Requests   Final    BOTTLES DRAWN AEROBIC AND  ANAEROBIC Blood Culture results may not be optimal due to an excessive volume of blood received in culture bottles   Culture   Final    NO GROWTH 5 DAYS Performed at [facility], [address] [place], [place] [address]    Report Status 08/13/2018 FINAL  Final  Blood Culture (routine x 2)     Status: None   Collection Time: 08/08/18 10:50 AM  Result Value Ref Range Status   Specimen Description BLOOD RIGHT ANTECUBITAL  Final   Special Requests   Final    BOTTLES DRAWN AEROBIC AND ANAEROBIC Blood Culture adequate volume   Culture   Final    NO GROWTH 5 DAYS Performed at [facility], [address] [place], [place] [address]    Report Status 08/13/2018 FINAL  Final  CSF culture with Stat gram stain     Status: None   Collection Time: 08/08/18 12:02 PM  Result Value Ref Range Status   Specimen Description CSF SHUNT  Final   Special Requests CLOTTED SPECIMEN  Final   Gram Stain   Final    FEW WBC PRESENT,BOTH PMN AND MONONUCLEAR ABUNDANT RED BLOOD CELLS NO ORGANISMS SEEN    Culture   Final    MODERATE KLEBSIELLA PNEUMONIAE RESULT CALLED TO, READ BACK BY AND VERIFIED WITH: VALDIVIA RN AT 0900 ON [id number] BY SJW Performed at [facility], [address], [place], [place] [address]    Report Status 08/11/2018 FINAL  Final   Organism ID, Bacteria KLEBSIELLA PNEUMONIAE  Final      Susceptibility   Klebsiella pneumoniae - MIC*    AMPICILLIN RESISTANT Resistant     CEFAZOLIN <=4 SENSITIVE Sensitive     CEFEPIME <=1 SENSITIVE Sensitive     CEFTAZIDIME <=1 SENSITIVE Sensitive     CEFTRIAXONE <=1 SENSITIVE Sensitive     CIPROFLOXACIN <=0.25 SENSITIVE Sensitive     GENTAMICIN <=1 SENSITIVE Sensitive     IMIPENEM 1 SENSITIVE Sensitive     TRIMETH/SULFA <=20 SENSITIVE Sensitive     AMPICILLIN/SULBACTAM 4 SENSITIVE Sensitive     PIP/TAZO <=4 SENSITIVE Sensitive     Extended ESBL NEGATIVE Sensitive     * MODERATE KLEBSIELLA PNEUMONIAE  Urine culture     Status: None    Collection Time: 08/08/18 12:54 PM  Result Value Ref Range Status   Specimen Description URINE, RANDOM  Final   Special Requests NONE  Final   Culture   Final  NO GROWTH Performed at [facility], [address] [place], [place] [address]    Report Status 08/09/2018 FINAL  Final  Surgical pcr screen     Status: None   Collection Time: 08/09/18  1:10 PM  Result Value Ref Range Status   MRSA, PCR NEGATIVE NEGATIVE Final   Staphylococcus aureus NEGATIVE NEGATIVE Final    Comment: (NOTE) The Xpert SA Assay (FDA approved for NASAL specimens in patients 37 years of age and older), is one component of a comprehensive surveillance program. It is not intended to diagnose infection nor to guide or monitor treatment. Performed at [facility], [address], [place], [place] [address]   Anaerobic culture     Status: None (Preliminary result)   Collection Time: 08/09/18  4:04 PM  Result Value Ref Range Status   Specimen Description WOUND  Final   Special Requests   Final    NONE Performed at [facility], [address] [place], [place] [address]    Culture   Final    NO ANAEROBES ISOLATED; CULTURE IN PROGRESS FOR 5 DAYS   Report Status PENDING  Incomplete  Aerobic Culture (superficial specimen)     Status: None   Collection Time: 08/09/18  4:04 PM  Result Value Ref Range Status   Specimen Description WOUND  Final   Special Requests   Final    NONE Performed at [facility], [address] [place], [place] [address]    Gram Stain NO WBC SEEN NO ORGANISMS SEEN   Final   Culture RARE KLEBSIELLA PNEUMONIAE  Final   Report Status 08/12/2018 FINAL  Final   Organism ID, Bacteria KLEBSIELLA PNEUMONIAE  Final      Susceptibility   Klebsiella pneumoniae - MIC*    AMPICILLIN >=32 RESISTANT Resistant     CEFAZOLIN <=4 SENSITIVE Sensitive     CEFEPIME <=1 SENSITIVE Sensitive     CEFTAZIDIME <=1 SENSITIVE Sensitive     CEFTRIAXONE <=1 SENSITIVE Sensitive      CIPROFLOXACIN <=0.25 SENSITIVE Sensitive     GENTAMICIN <=1 SENSITIVE Sensitive     IMIPENEM 2 SENSITIVE Sensitive     TRIMETH/SULFA <=20 SENSITIVE Sensitive     AMPICILLIN/SULBACTAM 4 SENSITIVE Sensitive     PIP/TAZO <=4 SENSITIVE Sensitive     Extended ESBL NEGATIVE Sensitive     * RARE KLEBSIELLA PNEUMONIAE  CSF culture with Stat gram stain     Status: None   Collection Time: 08/11/18 10:12 AM  Result Value Ref Range Status   Specimen Description CSF  Final   Special Requests NONE  Final   Gram Stain   Final    WBC PRESENT,BOTH PMN AND MONONUCLEAR GRAM POSITIVE COCCI IN PAIRS CRITICAL RESULT CALLED TO, READ BACK BY AND VERIFIED WITH: RN AMANDA TROXLER 08/11/18 AT 1412 BY CM    Culture   Final    NO GROWTH 3 DAYS Performed at [facility], [address], [place], [place] [address]    Report Status 08/14/2018 FINAL  Final  Blood culture (routine x 2)     Status: None (Preliminary result)   Collection Time: 08/11/18 10:56 AM  Result Value Ref Range Status   Specimen Description BLOOD RIGHT HAND  Final   Special Requests   Final    BOTTLES DRAWN AEROBIC AND ANAEROBIC Blood Culture adequate volume   Culture   Final    NO GROWTH 3 DAYS Performed at [facility],  [address], [place], [place] [address]    Report Status PENDING  Incomplete  Blood culture (routine x 2)     Status: None (Preliminary result)   Collection Time: 08/11/18 10:59 AM  Result Value Ref Range Status   Specimen Description BLOOD RIGHT ANTECUBITAL  Final   Special Requests   Final    BOTTLES DRAWN AEROBIC AND ANAEROBIC Blood Culture adequate volume   Culture   Final    NO GROWTH 3 DAYS Performed at [facility], [address] [place], [place] [address]    Report Status PENDING  Incomplete  Culture, fungus without smear     Status: None (Preliminary result)   Collection Time: 08/11/18  4:41 PM  Result Value Ref Range Status   Specimen Description CSF  Final   Special Requests  Normal  Final   Culture   Final    NO FUNGUS ISOLATED AFTER 2 DAYS Performed at [facility], [address] [place], [place] [address]    Report Status PENDING  Incomplete  CSF culture     Status: None (Preliminary result)   Collection Time: 08/13/18 10:49 AM  Result Value Ref Range Status   Specimen Description CSF  Final   Special Requests NONE  Final   Gram Stain   Final    WBC PRESENT, PREDOMINANTLY PMN NO ORGANISMS SEEN    Culture   Final    NO GROWTH < 24 HOURS Performed at [facility], [address] [place], [place] [address]    Report Status PENDING  Incomplete    Studies/Results: Ct Head Wo Contrast  Result Date: 08/13/2018 CLINICAL DATA:  Follow-up evaluation.  Ventriculitis and EVD. EXAM: CT HEAD WITHOUT CONTRAST TECHNIQUE: Contiguous axial images were obtained from the base of the skull through the vertex without intravenous contrast. COMPARISON:  CT HEAD August 11, 2018 and August 10, 2018 FINDINGS: BRAIN: Ventriculostomy catheter traversing the LEFT lateral ventricle, distal tip in LEFT basal ganglia. Anterior recess of the third ventricle is 10 mm, previously 8 mm. However, decreased size of lateral ventricles. Bifrontal dimension is 3.6 cm, previously 4.5 cm. Persistent transependymal flow cerebral spinal fluid/interstitial edema. Small amount of residual intraventricular pneumocephalus. Isodense fourth ventricle. Small volume acute blood products along prior RIGHT parietal catheter tract with encephalomalacia. No acute large vascular territory infarct. No abnormal extra-axial fluid collections. Basal cisterns are patent. VASCULAR: Moderate calcific atherosclerosis of the carotid siphons. SKULL: No skull fracture. No significant scalp soft tissue swelling. SINUSES/ORBITS: Trace paranasal sinus mucosal thickening. Mastoid air cells are well aerated.The included ocular globes and orbital contents are non-suspicious. OTHER: None. IMPRESSION: 1. Enlarging  third ventricle concerning for worsening hydrocephalus, however decreasing size of lateral ventricles. 2. Isodense fourth ventricle compatible with intraventricular debris and ventriculitis. 3. Small volume residual blood products along RIGHT parietal catheter tract. Electronically Signed   By: Awilda Metro M.D.   On: 08/13/2018 05:42   Ct Abdomen Pelvis W Contrast  Result Date: 08/12/2018 CLINICAL DATA:  62 year old male with ventriculitis. Shunt removed but with ongoing fevers. Subsequent encounter. EXAM: CT ABDOMEN AND PELVIS WITH CONTRAST TECHNIQUE: Multidetector CT imaging of the abdomen and pelvis was performed using the standard protocol following bolus administration of intravenous contrast. CONTRAST:  OMNIPAQUE IOHEXOL 300 MG/ML  SOLN COMPARISON:  08/11/2018 head CT. 08/08/2018 chest CT 03/14/2016 CT abdomen and pelvis. FINDINGS: Lower chest: Persistent left base parenchymal changes may represent atelectasis or residua of infiltrate. Slight increase in size of  adjacent small left-sided pleural effusion. Heart size within normal limits. Hepatobiliary: Enlarged liver spanning over 19.6 cm. Focal fatty infiltration adjacent to the fissure for the falciform ligament. No worrisome hepatic lesion. Contracted gallbladder without calcified gallstones. No CT evidence of primary gallbladder inflammation. Pancreas: No worrisome pancreatic mass or primary pancreatic inflammation. Spleen: 5 mm low-density structure within the spleen of indeterminate etiology. Spleen is not enlarged. Adrenals/Urinary Tract: Slightly heterogeneous appearance of kidneys (particularly left kidney) on delayed imaging. Question pyelonephritis. No obstructing stone or hydronephrosis. 1.5 cm left renal cyst. Hyperplasia adrenal glands. Radiopaque material within the dependent aspect of the urinary bladder may represent debris from infection. Cannot exclude primary bladder wall abnormality given this appearance. Stomach/Bowel:  Haziness of fat planes throughout the abdomen and pelvis however, this does not appear to be centered around bowel to suggest primary bowel inflammatory process. Evaluation of the gastric antrum is limited secondary to under distension. No evidence of primary appendix inflammation. Vascular/Lymphatic: No abdominal aortic aneurysm. Slightly tortuous iliac arteries. No large vessel occlusion. Scattered small lymph nodes. Reproductive: No worrisome abnormality. Other: Ventricular shunt enters the upper central abdomen courses inferiorly to left of midline and then superiorly to just below the left hemidiaphragm. At the level of the tip of the catheter, there is a 4.4 x 2.2 x 2 cm fluid collection which may represent small abscess or loculated sterile fluid collection. Prior left inguinal hernia repair. No bowel containing hernia. No free intraperitoneal air. Musculoskeletal: Mild scoliosis lumbar spine convex left. Superimposed degenerative changes greatest on the right at the L4-5 level. IMPRESSION: 1. Ventricular shunt enters the upper central abdomen courses inferiorly to left of midline and then superiorly to just below the left hemidiaphragm. At the level of the tip of the catheter, there is a 4.4 x 2.2 x 2 cm fluid collection which may represent a small abscess or loculated sterile fluid collection. 2. Persistent left base parenchymal changes may represent atelectasis or residua of infiltrate. Slight increase in size of adjacent small left-sided pleural effusion. 3. Slightly heterogeneous appearance of kidneys on delayed imaging. Question pyelonephritis. Radiopaque material within the dependent aspect of the urinary bladder may represent debris from infection. Cannot exclude primary bladder wall abnormality given this appearance. 4. Haziness of fat planes throughout the abdomen and pelvis however, this does not appear to be centered around bowel to suggest primary bowel inflammatory process. 5. 5 mm nonspecific  low-density lesion within the spleen. 6. Enlarged liver. Electronically Signed   By: Lacy Duverney M.D.   On: 08/12/2018 17:22   [place] Ekg Site Rite  Result Date: 08/13/2018 If Site Rite image not attached, placement could not be confirmed due to current cardiac rhythm.    Assessment/Plan: Ventriculitis VP shunt removed 9-27  Total days of antibiotics: 7 (ceftriaxone/vanco), 2 intraventricular Natasha Bence.   Repeat CSF shows increase in his WBC count.  Would query if his increase in his CSF WBC is from his steroids? His GPC did not grow, would stop vanco at 7 days.  For repeat CT and CSF later this week.  He appears to be improving.  Will continue to follow.          Johny Sax MD, FACP Infectious Diseases (pager) [phone] [email or web address] 08/14/2018, 10:44 AM  LOS: 6 days

## 2018-08-15 DIAGNOSIS — Z982 Presence of cerebrospinal fluid drainage device: Secondary | ICD-10-CM

## 2018-08-15 DIAGNOSIS — Z95828 Presence of other vascular implants and grafts: Secondary | ICD-10-CM

## 2018-08-15 LAB — CSF CELL COUNT WITH DIFFERENTIAL
EOS CSF: 0 % (ref 0–1)
Lymphs, CSF: 64 % (ref 40–80)
MONOCYTE-MACROPHAGE-SPINAL FLUID: 12 % — AB (ref 15–45)
RBC COUNT CSF: 290 /mm3 — AB
Segmented Neutrophils-CSF: 24 % — ABNORMAL HIGH (ref 0–6)
WBC, CSF: 730 /mm3 (ref 0–5)

## 2018-08-15 LAB — BASIC METABOLIC PANEL
ANION GAP: 8 (ref 5–15)
BUN: 20 mg/dL (ref 6–20)
CALCIUM: 8.3 mg/dL — AB (ref 8.9–10.3)
CO2: 24 mmol/L (ref 22–32)
Chloride: 105 mmol/L (ref 98–111)
Creatinine, Ser: 0.84 mg/dL (ref 0.61–1.24)
GFR calc Af Amer: >60 mL/min (ref >60)
GFR calc non Af Amer: >60 mL/min (ref >60)
GLUCOSE: 119 mg/dL — AB (ref 70–99)
Potassium: 4 mmol/L (ref 3.5–5.1)
Sodium: 137 mmol/L (ref 135–145)

## 2018-08-15 LAB — PATHOLOGIST SMEAR REVIEW

## 2018-08-15 LAB — PROTEIN AND GLUCOSE, CSF
Glucose, CSF: 24 mg/dL — CL (ref 40–70)
Total  Protein, CSF: 245 mg/dL — ABNORMAL HIGH (ref 15–45)

## 2018-08-15 LAB — ANAEROBIC CULTURE

## 2018-08-15 LAB — GLUCOSE, CAPILLARY
Glucose-Capillary: 119 mg/dL — ABNORMAL HIGH (ref 70–99)
Glucose-Capillary: 147 mg/dL — ABNORMAL HIGH (ref 70–99)
Glucose-Capillary: 134 mg/dL — ABNORMAL HIGH (ref 70–99)
Glucose-Capillary: 204 mg/dL — ABNORMAL HIGH (ref 70–99)

## 2018-08-15 MED ORDER — GENTAMICIN SULFATE 10 MG/ML IJ SOLN
8.0000 mg | Freq: Every day | INTRAMUSCULAR | Status: DC
  Administered 2018-08-16 – 2018-08-17 (×2): 8 mg via INTRATHECAL
  Filled 2018-08-15 (×3): qty 0.8

## 2018-08-15 MED ORDER — GENTAMICIN SULFATE 10 MG/ML IJ SOLN
8.0000 mg | INTRAMUSCULAR | Status: AC
  Administered 2018-08-15: 8 mg via INTRATHECAL
  Filled 2018-08-15 (×2): qty 0.8

## 2018-08-15 NOTE — Progress Notes (Signed)
No new issues or problems overnight.  Patient wide awake.  No headache.  Speech fluent.  Oriented.  Cranial nerve function intact bilaterally.  Motor 5/5 bilaterally.  Ventricular drain output continues to be minimal.  No adverse effects from intraventricular gentamicin.  CSF sent to lab today.  Check follow-up head CT scan tomorrow.  Continue intraventricular gentamicin with a plan of 5 total doses.  Continue current antibiotics although at some point we need to reexamine as to whether vancomycin is necessary.

## 2018-08-15 NOTE — Progress Notes (Signed)
   Subjective: No overnight events. Charles Marquez reports that his double vision and memory deficits are still present, but have improved. He denies new symptoms and has been tolerating the intraventricular gentamicin without issue.  Objective:  Vital signs in last 24 hours: Vitals:   08/15/18 0300 08/15/18 0400 08/15/18 0500 08/15/18 0600  BP: 120/62 127/81 118/82 127/86  Pulse: (!) 51 (!) 50 (!) 52 (!) 51  Resp: 14 10 (!) 9 13  Temp:  97.8 F (36.6 C)    TempSrc:  Oral    SpO2: 95% 97% 97% 95%  Weight:      Height:       Physical exam Gen: lying comfortably in bed, no distress HENT: EVD draining turbid yellow CSF. Neuro:Alert and oriented x3. Gaze-directed nystagmus, appears improved since yesterday.Resting tremor. No dysmetria on cerebellar testing.  Abd: Bowel sounds present. Soft, non-distended, non-tender. Ext: No lower extremity edema Skin: warm and dry. Acneiform eruption over the back appears to be improving.   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 He was transitioned to ceftriaxone from cefepime for better CNS coverage given the sensitivity results of the specimen on 08/10/2018.Vancomycin was later added when Gram positive cocci in pairs grew in his CSF. Started on intraventricular gentamicin on 10/1 for ongoing high cell count in the CSF.  Encephalitis/ventriculitisand communicating hydrocephalus - Patientcontinues to feel well. Nystagmus has improved today. - Repeat CSF cultures from 10/1 have  shown NG x48 hrs.  - Vancomycin for 2 more days. Intraventricular gentamicin for 3 more days. Prolonged course of ceftriaxone. - Continuing Decadron and Keppra. - PT and OT recommend CIR upon discharge Plan -Continue management per neurosurgery and ID teams  Hyperglycemia - No history of DM. Hyperglycemia secondary to decadron.  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  Plan - am BMP - 2L fluid restriction  Dispo: Anticipated dischargein approximatelyhowever many days until he is stable.  Charles Marquez, Charles Corti, MD 08/15/2018, 6:55 AM Pager: [phone]

## 2018-08-15 NOTE — Progress Notes (Signed)
Physical Therapy Treatment Patient Details Name: Daniele Yankowski MRN: [id number] DOB: February 25, 1956 Today's Date: 08/15/2018    History of Present Illness 62 y.o. 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  Pt dx with acute encepolopathy and fever (102).  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  Pt was suspected to have infection.  Neurosurgery consulted and tapped shunt.  CT also showed some lung changes supicious for PNA. Infectious disease following for ventriculits. Neurosurgery surgically removed his shunt and placed a ventricular catheter on 08/12/18.      PT Comments    Though gait remains unsteady, there are notable improvements--compensations are moderating the deviations with less overt LOB.  Pt is more able to control his speed without the festination seen in other sessions   Follow Up Recommendations  CIR;Supervision/Assistance - 24 hour;Other (comment)     Equipment Recommendations  None recommended by PT    Recommendations for Other Services       Precautions / Restrictions Precautions Precautions: Fall Precaution Comments: double vision, visual changes Restrictions Weight Bearing Restrictions: No    Mobility  Bed Mobility Overal bed mobility: Needs Assistance Bed Mobility: Supine to Sit     Supine to sit: Supervision        Transfers Overall transfer level: Needs assistance   Transfers: Sit to/from Stand Sit to Stand: Min guard         General transfer comment: no hands and guard for mild unsteadiness, no contact needed  Ambulation/Gait Ambulation/Gait assistance: Min assist Gait Distance (Feet): 150 Feet(x3  standing rest to regroup) Assistive device: None;IV Pole Gait Pattern/deviations: Step-through pattern   Gait velocity interpretation: 1.31 - 2.62 ft/sec, indicative of limited community ambulator General Gait Details: pt gait unsteady  overall, but pt doing a better job compensating with less LOB.  compensations including drifting, stepping and fewer mild staggers.  Worsens with scanning or directional changes.  Notable degradation of coordination after about 150. and rest periods help reset some stability   Stairs             Wheelchair Mobility    Modified Rankin (Stroke Patients Only)       Balance Overall balance assessment: Needs assistance   Sitting balance-Leahy Scale: Fair       Standing balance-Leahy Scale: (to fair) Standing balance comment: pt now maintaining static stance with ankle strategies, but still lots of sway.                            Cognition Arousal/Alertness: Awake/alert Behavior During Therapy: WFL for tasks assessed/performed Overall Cognitive Status: Within Functional Limits for tasks assessed                                        Exercises      General Comments General comments (skin integrity, edema, etc.): vss      Pertinent Vitals/Pain Pain Assessment: No/denies pain Faces Pain Scale: No hurt    Home Living                      Prior Function            PT Goals (current goals can now be found in the care plan section) Acute Rehab PT Goals Patient Stated Goal:  to get back to normal PT Goal Formulation: With patient/family Time For Goal Achievement: 08/23/18 Potential to Achieve Goals: Good Progress towards PT goals: Progressing toward goals    Frequency    Min 3X/week      PT Plan Current plan remains appropriate    Co-evaluation              AM-PAC PT "6 Clicks" Daily Activity  Outcome Measure  Difficulty turning over in bed (including adjusting bedclothes, sheets and blankets)?: A Little Difficulty moving from lying on back to sitting on the side of the bed? : A Little Difficulty sitting down on and standing up from a chair with arms (e.g., wheelchair, bedside commode, etc,.)?: A Little Help  needed moving to and from a bed to chair (including a wheelchair)?: A Little Help needed walking in hospital room?: A Little Help needed climbing 3-5 steps with a railing? : A Little 6 Click Score: 18    End of Session   Activity Tolerance: Patient tolerated treatment well Patient left: Other (comment);with nursing/sitter in room;with family/visitor present(on toilet) Nurse Communication: Mobility status PT Visit Diagnosis: Unsteadiness on feet (R26.81);Difficulty in walking, not elsewhere classified (R26.2)     Time: 6295-2841 PT Time Calculation (min) (ACUTE ONLY): 19 min  Charges:  $Gait Training: 8-22 mins                     08/15/2018  [place] Bing, PT Acute Rehabilitation Services [phone]  (pager) [phone]  (office)   Eliseo Gum Akshar Starnes 08/15/2018, 2:16 PM

## 2018-08-15 NOTE — Progress Notes (Signed)
INFECTIOUS DISEASE PROGRESS NOTE  ID: Charles Marquez is a 62 y.o. male with  Active Problems:   Acute encephalopathy   Infection of ventricular shunt (HCC)   Bacterial encephalitis   Hyponatremia   Hypokalemia  Subjective: No complaints  Abtx:  Anti-infectives (From admission, onward)   Start     Dose/Rate Route Frequency Ordered Stop   08/16/18 1000  gentamicin (PF) (GARAMYCIN) injection 8 mg     8 mg Intrathecal Daily 08/15/18 1024 08/19/18 0959   08/15/18 1015  gentamicin (PF) (GARAMYCIN) injection 8 mg     8 mg Intrathecal STAT 08/15/18 1008 08/16/18 1015   08/14/18 2300  vancomycin (VANCOCIN) 1,250 mg in sodium chloride 0.9 % 250 mL IVPB     1,250 mg 166.7 mL/hr over 90 Minutes Intravenous Every 12 hours 08/14/18 1400     08/13/18 1500  gentamicin (PF) (GARAMYCIN) injection 8 mg     8 mg Intrathecal STAT 08/13/18 1446 08/13/18 1656   08/11/18 2200  vancomycin (VANCOCIN) IVPB 1000 mg/200 mL premix  Status:  Discontinued     1,000 mg 200 mL/hr over 60 Minutes Intravenous Every 8 hours 08/11/18 1350 08/14/18 1353   08/11/18 1415  vancomycin (VANCOCIN) 1,500 mg in sodium chloride 0.9 % 500 mL IVPB     1,500 mg 250 mL/hr over 120 Minutes Intravenous  Once 08/11/18 1350 08/11/18 1613   08/10/18 1600  cefTRIAXone (ROCEPHIN) 2 g in sodium chloride 0.9 % 100 mL IVPB     2 g 200 mL/hr over 30 Minutes Intravenous Every 12 hours 08/10/18 1025     08/09/18 1427  bacitracin 50,000 Units in sodium chloride 0.9 % 500 mL irrigation  Status:  Discontinued       As needed 08/09/18 1427 08/09/18 1639   08/09/18 0000  vancomycin (VANCOCIN) IVPB 1000 mg/200 mL premix  Status:  Discontinued     1,000 mg 200 mL/hr over 60 Minutes Intravenous Every 8 hours 08/08/18 1534 08/09/18 0952   08/08/18 2330  ceFEPIme (MAXIPIME) 2 g in sodium chloride 0.9 % 100 mL IVPB  Status:  Discontinued     2 g 200 mL/hr over 30 Minutes Intravenous Every 8 hours 08/08/18 1534 08/10/18 1025   08/08/18 1515   ceFEPIme (MAXIPIME) 2 g in sodium chloride 0.9 % 100 mL IVPB     2 g 200 mL/hr over 30 Minutes Intravenous  Once 08/08/18 1509 08/08/18 1832   08/08/18 1515  vancomycin (VANCOCIN) 1,500 mg in sodium chloride 0.9 % 500 mL IVPB  Status:  Discontinued     1,500 mg 250 mL/hr over 120 Minutes Intravenous  Once 08/08/18 1509 08/09/18 1223      Medications:  Scheduled: . atorvastatin  40 mg Oral Daily  . Chlorhexidine Gluconate Cloth  6 each Topical Daily  . dexamethasone  4 mg Intravenous Q6H  . gentamicin (PF)  8 mg Intrathecal STAT  . [START ON 08/16/2018] gentamicin (PF)  8 mg Intrathecal Daily  . insulin aspart  0-15 Units Subcutaneous TID WC  . insulin aspart  0-5 Units Subcutaneous QHS  . sodium chloride flush  10-40 mL Intracatheter Q12H    Objective: Vital signs in last 24 hours: Temp:  [97.5 F (36.4 C)-98 F (36.7 C)] 97.6 F (36.4 C) (10/03 0800) Pulse Rate:  [50-60] 56 (10/03 0900) Resp:  [9-19] 16 (10/03 0900) BP: (113-135)/(62-100) 135/97 (10/03 0900) SpO2:  [95 %-100 %] 98 % (10/03 0900)   General appearance: alert, cooperative and no distress  Head: drain site is clean, non-tender, no fluctuance.  Resp: clear to auscultation bilaterally Cardio: regular rate and rhythm GI: normal findings: bowel sounds normal and soft, non-tender Extremities: RUE PIC is clean.   Lab Results Recent Labs    08/14/18 0309 08/15/18 0915  NA 137 137  K 3.8 4.0  CL 102 105  CO2 25 24  BUN 19 20  CREATININE 0.90 0.84   Liver Panel No results for input(s): PROT, ALBUMIN, AST, ALT, ALKPHOS, BILITOT, BILIDIR, IBILI in the last 72 hours. Sedimentation Rate Recent Labs    08/12/18 1135  ESRSEDRATE 52*   C-Reactive Protein Recent Labs    08/12/18 1135  CRP 1.9*    Microbiology: Recent Results (from the past 240 hour(s))  Blood Culture (routine x 2)     Status: None   Collection Time: 08/08/18 10:40 AM  Result Value Ref Range Status   Specimen Description BLOOD RIGHT  ARM  Final   Special Requests   Final    BOTTLES DRAWN AEROBIC AND ANAEROBIC Blood Culture results may not be optimal due to an excessive volume of blood received in culture bottles   Culture   Final    NO GROWTH 5 DAYS Performed at [facility], [address], [place], [place] [address]    Report Status 08/13/2018 FINAL  Final  Blood Culture (routine x 2)     Status: None   Collection Time: 08/08/18 10:50 AM  Result Value Ref Range Status   Specimen Description BLOOD RIGHT ANTECUBITAL  Final   Special Requests   Final    BOTTLES DRAWN AEROBIC AND ANAEROBIC Blood Culture adequate volume   Culture   Final    NO GROWTH 5 DAYS Performed at [facility], [address] [place], [place] [address]    Report Status 08/13/2018 FINAL  Final  CSF culture with Stat gram stain     Status: None   Collection Time: 08/08/18 12:02 PM  Result Value Ref Range Status   Specimen Description CSF SHUNT  Final   Special Requests CLOTTED SPECIMEN  Final   Gram Stain   Final    FEW WBC PRESENT,BOTH PMN AND MONONUCLEAR ABUNDANT RED BLOOD CELLS NO ORGANISMS SEEN    Culture   Final    MODERATE KLEBSIELLA PNEUMONIAE RESULT CALLED TO, READ BACK BY AND VERIFIED WITH: VALDIVIA RN AT 0900 ON [id number] BY SJW Performed at [facility], [address], [place], [place] [address]    Report Status 08/11/2018 FINAL  Final   Organism ID, Bacteria KLEBSIELLA PNEUMONIAE  Final      Susceptibility   Klebsiella pneumoniae - MIC*    AMPICILLIN RESISTANT Resistant     CEFAZOLIN <=4 SENSITIVE Sensitive     CEFEPIME <=1 SENSITIVE Sensitive     CEFTAZIDIME <=1 SENSITIVE Sensitive     CEFTRIAXONE <=1 SENSITIVE Sensitive     CIPROFLOXACIN <=0.25 SENSITIVE Sensitive     GENTAMICIN <=1 SENSITIVE Sensitive     IMIPENEM 1 SENSITIVE Sensitive     TRIMETH/SULFA <=20 SENSITIVE Sensitive     AMPICILLIN/SULBACTAM 4 SENSITIVE Sensitive     PIP/TAZO <=4 SENSITIVE Sensitive     Extended ESBL NEGATIVE Sensitive       * MODERATE KLEBSIELLA PNEUMONIAE  Urine culture     Status: None   Collection Time: 08/08/18 12:54 PM  Result Value Ref Range Status   Specimen Description URINE, RANDOM  Final   Special Requests NONE  Final  Culture   Final    NO GROWTH Performed at [facility], [address] [place], [place] [address]    Report Status 08/09/2018 FINAL  Final  Surgical pcr screen     Status: None   Collection Time: 08/09/18  1:10 PM  Result Value Ref Range Status   MRSA, PCR NEGATIVE NEGATIVE Final   Staphylococcus aureus NEGATIVE NEGATIVE Final    Comment: (NOTE) The Xpert SA Assay (FDA approved for NASAL specimens in patients 81 years of age and older), is one component of a comprehensive surveillance program. It is not intended to diagnose infection nor to guide or monitor treatment. Performed at [facility], [address] [place], [place] [address]   Anaerobic culture     Status: None (Preliminary result)   Collection Time: 08/09/18  4:04 PM  Result Value Ref Range Status   Specimen Description WOUND  Final   Special Requests NONE  Final   Culture   Final    HOLDING FOR POSSIBLE ANAEROBE Performed at [facility], [address], [place], [place] [address]    Report Status PENDING  Incomplete  Aerobic Culture (superficial specimen)     Status: None   Collection Time: 08/09/18  4:04 PM  Result Value Ref Range Status   Specimen Description WOUND  Final   Special Requests   Final    NONE Performed at [facility], [address], [place], [place] [address]    Gram Stain NO WBC SEEN NO ORGANISMS SEEN   Final   Culture RARE KLEBSIELLA PNEUMONIAE  Final   Report Status 08/12/2018 FINAL  Final   Organism ID, Bacteria KLEBSIELLA PNEUMONIAE  Final      Susceptibility   Klebsiella pneumoniae - MIC*    AMPICILLIN >=32 RESISTANT Resistant     CEFAZOLIN <=4 SENSITIVE Sensitive     CEFEPIME <=1 SENSITIVE Sensitive     CEFTAZIDIME <=1 SENSITIVE Sensitive      CEFTRIAXONE <=1 SENSITIVE Sensitive     CIPROFLOXACIN <=0.25 SENSITIVE Sensitive     GENTAMICIN <=1 SENSITIVE Sensitive     IMIPENEM 2 SENSITIVE Sensitive     TRIMETH/SULFA <=20 SENSITIVE Sensitive     AMPICILLIN/SULBACTAM 4 SENSITIVE Sensitive     PIP/TAZO <=4 SENSITIVE Sensitive     Extended ESBL NEGATIVE Sensitive     * RARE KLEBSIELLA PNEUMONIAE  CSF culture with Stat gram stain     Status: None   Collection Time: 08/11/18 10:12 AM  Result Value Ref Range Status   Specimen Description CSF  Final   Special Requests NONE  Final   Gram Stain   Final    WBC PRESENT,BOTH PMN AND MONONUCLEAR GRAM POSITIVE COCCI IN PAIRS CRITICAL RESULT CALLED TO, READ BACK BY AND VERIFIED WITH: RN AMANDA TROXLER 08/11/18 AT 1412 BY CM    Culture   Final    NO GROWTH 3 DAYS Performed at [facility], [address] [place], [place] [address]    Report Status 08/14/2018 FINAL  Final  Blood culture (routine x 2)     Status: None (Preliminary result)   Collection Time: 08/11/18 10:56 AM  Result Value Ref Range Status   Specimen Description BLOOD RIGHT HAND  Final   Special Requests   Final    BOTTLES DRAWN AEROBIC AND ANAEROBIC Blood Culture adequate volume   Culture   Final    NO GROWTH 4 DAYS Performed at [facility], [address] [place], [place] [address]    Report Status PENDING  Incomplete  Blood culture (routine x 2)     Status: None (Preliminary result)   Collection Time: 08/11/18 10:59 AM  Result Value Ref Range Status   Specimen Description BLOOD RIGHT ANTECUBITAL  Final   Special Requests   Final    BOTTLES DRAWN AEROBIC AND ANAEROBIC Blood Culture adequate volume   Culture   Final    NO GROWTH 4 DAYS Performed at [facility], [address], [place], [place] [address]    Report Status PENDING  Incomplete  Culture, fungus without smear     Status: None (Preliminary result)   Collection Time: 08/11/18  4:41 PM  Result Value Ref Range Status   Specimen  Description CSF  Final   Special Requests Normal  Final   Culture   Final    NO FUNGUS ISOLATED AFTER 3 DAYS Performed at [facility], [address] [place], [place] [address]    Report Status PENDING  Incomplete  CSF culture     Status: None (Preliminary result)   Collection Time: 08/13/18 10:49 AM  Result Value Ref Range Status   Specimen Description CSF  Final   Special Requests NONE  Final   Gram Stain   Final    WBC PRESENT, PREDOMINANTLY PMN NO ORGANISMS SEEN    Culture   Final    NO GROWTH 2 DAYS Performed at [facility], [address] [place], [place] [address]    Report Status PENDING  Incomplete    Studies/Results: [place] Ekg Site Rite  Result Date: 08/13/2018 If Site Rite image not attached, placement could not be confirmed due to current cardiac rhythm.    Assessment/Plan: Ventriculitis VP shunt replaced 9-27 CSF Cx Klebsiella (9-27), negative 9-29, ngtd 10-1  Total days of antibiotics: 8 (ceftriaxone/vanco), 3 intraventricular Gent.   Will stop vanco in 2 days D/i neurosurgery, will get 3 more days of intraventricular gent.  Will plan for prolonged course of ceftriaxone  Clinically appears to be doing well.          Johny Sax MD, FACP Infectious Diseases (pager) [phone] [email or web address] 08/15/2018, 10:46 AM  LOS: 7 days

## 2018-08-16 ENCOUNTER — Inpatient Hospital Stay (HOSPITAL_COMMUNITY): Payer: BLUE CROSS/BLUE SHIELD

## 2018-08-16 DIAGNOSIS — G9349 Other encephalopathy: Secondary | ICD-10-CM

## 2018-08-16 LAB — CULTURE, BLOOD (ROUTINE X 2)
CULTURE: NO GROWTH
CULTURE: NO GROWTH
Special Requests: ADEQUATE
Special Requests: ADEQUATE

## 2018-08-16 LAB — BASIC METABOLIC PANEL
Anion gap: 8 (ref 5–15)
BUN: 21 mg/dL — AB (ref 6–20)
CHLORIDE: 106 mmol/L (ref 98–111)
CO2: 25 mmol/L (ref 22–32)
CREATININE: 0.91 mg/dL (ref 0.61–1.24)
Calcium: 7.9 mg/dL — ABNORMAL LOW (ref 8.9–10.3)
Glucose, Bld: 126 mg/dL — ABNORMAL HIGH (ref 70–99)
POTASSIUM: 4.1 mmol/L (ref 3.5–5.1)
SODIUM: 139 mmol/L (ref 135–145)

## 2018-08-16 LAB — GLUCOSE, CAPILLARY
GLUCOSE-CAPILLARY: 126 mg/dL — AB (ref 70–99)
Glucose-Capillary: 113 mg/dL — ABNORMAL HIGH (ref 70–99)
Glucose-Capillary: 107 mg/dL — ABNORMAL HIGH (ref 70–99)
Glucose-Capillary: 179 mg/dL — ABNORMAL HIGH (ref 70–99)

## 2018-08-16 MED ORDER — SENNA 8.6 MG PO TABS: 1.0000 | ORAL_TABLET | Freq: Every day | ORAL | Status: DC | PRN

## 2018-08-16 MED ORDER — VANCOMYCIN HCL 10 G IV SOLR
1250.0000 mg | Freq: Two times a day (BID) | INTRAVENOUS | Status: DC
  Administered 2018-08-16 – 2018-08-18 (×4): 1250 mg via INTRAVENOUS
  Filled 2018-08-16 (×4): qty 1250

## 2018-08-16 MED ORDER — DOCUSATE SODIUM 100 MG PO CAPS: 100.0000 mg | ORAL_CAPSULE | Freq: Every day | ORAL | Status: DC | PRN

## 2018-08-16 MED ORDER — POLYETHYLENE GLYCOL 3350 17 G PO PACK: 17.0000 g | PACK | Freq: Every day | ORAL | Status: DC | PRN

## 2018-08-16 MED ORDER — DEXAMETHASONE SODIUM PHOSPHATE 10 MG/ML IJ SOLN
2.0000 mg | Freq: Two times a day (BID) | INTRAMUSCULAR | Status: DC
  Administered 2018-08-16 – 2018-08-17 (×3): 2 mg via INTRAVENOUS
  Filled 2018-08-16 (×3): qty 1

## 2018-08-16 NOTE — Progress Notes (Signed)
Nutrition Follow-up  DOCUMENTATION CODES:   Not applicable  INTERVENTION:   Liberalize diet to Regular Continue 2L fluid restriction  Encourage PO intake at meals and snacks  NUTRITION DIAGNOSIS:   Increased nutrient needs related to post-op healing, acute illness(shunt removal, bacterial meningitis) as evidenced by estimated needs. Ongoing.   GOAL:   Patient will meet greater than or equal to 90% of their needs Met.   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 CSF culture from the VP shunt is growing moderate klebsiella.  Pt discussed during ICU rounds and with RN.  9/27 shunt removed, EVD placed Plan for shunt once infection clears per MD  Spoke with pt and his wife. They report appetite is great, eating 100% of his meals. He is enjoying the food here. He would like additional options as he is currently on the renal diet and a 2 L fluid restriction.  Spoke with internal medicine, ok with regular diet. Discussed with RN.    Diet Order:   Diet Order            Diet renal with fluid restriction Fluid restriction: 2000 mL Fluid; Room service appropriate? Yes; Fluid consistency: Thin  Diet effective now              EDUCATION NEEDS:   Not appropriate for education at this time  Skin:  Skin Assessment: Skin Integrity Issues: Skin Integrity Issues:: Incisions Incisions: head  Last BM:  9/27  Height:   Ht Readings from Last 1 Encounters:  08/08/18 5' 11" (1.803 m)    Weight:   Wt Readings from Last 1 Encounters:  08/08/18 86.2 kg    Ideal Body Weight:  78 kg  BMI:  Body mass index is 26.5 kg/m.  Estimated Nutritional Needs:   Kcal:  2100-2300  Protein:  110-120 grams  Fluid:  2.1 - 2.3 L/day  Maylon Peppers RD, LDN, CNSC [phone] Pager [phone]  After Hours Pager

## 2018-08-16 NOTE — Progress Notes (Signed)
   Subjective: No overnight events. Charles Marquez reports that his double vision has been intermittent over the past day and he currently has no double vision. He was able to ambulate with more steadiness with single vision. No new symptoms or concerns at this time.  Objective:  Vital signs in last 24 hours: Vitals:   08/16/18 0400 08/16/18 0413 08/16/18 0500 08/16/18 0600  BP:  (!) 143/99  (!) 140/92  Pulse: (!) 50 (!) 58 (!) 52 (!) 54  Resp: 16 14 (!) 9 10  Temp:      TempSrc:      SpO2: 99% 98% 98% 96%  Weight:      Height:       Physical exam Gen: lying comfortably in bed, no distress HENT: EVD drainingturbid yellowCSF. Neuro:Alert and oriented x3. Gaze-directed nystagmus.Resting tremor. No dysmetria on cerebellar testing.  Ext: No lower extremity edema Skin: warm and dry.Acneiform eruption over the back appears to be improving.   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 He was transitioned to ceftriaxone from cefepime for better CNS coverage given the sensitivity results of the specimen on 08/10/2018.Vancomycin was later added when Gram positive cocci in pairs grew in his CSF.Started on intraventricular gentamicin on 10/1 for ongoing high cell count in the CSF.  Encephalitis/ventriculitisand communicating hydrocephalus - Patientcontinues to feel well. Double vision has improved. - Repeat CSFcultures from 10/4 have shown no growth to date. - Repeat head CT 10/4 showed mild enlargement of the third  ventricle in the right lateral ventricle and stable partial decompression of the left lateral ventricle. - Vancomycin for 1 more days. Intraventricular gentamicin for 2 more days. Prolonged course of ceftriaxone. -Continuing Decadron andKeppra. - PT and OT recommend CIR upon discharge - He will need removal of the EVD and placement of internal VP shunt after infection clears Plan -Continue management per neurosurgery and ID teams - Consider changing Keppra and Decadron from IV to PO    Hyperglycemia - No history of DM. Hyperglycemic secondary to decadron. Sugars ranged 120s-204. 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 Plan - am BMP -2L fluid restriction  Dispo: Anticipated dischargein approximatelyhowever many days until he is stable.   Charles Marquez, Cathleen Corti, MD 08/16/2018, 6:38 AM Pager: [phone]

## 2018-08-16 NOTE — Progress Notes (Signed)
Occupational Therapy Treatment Patient Details Name: Charles Marquez MRN: [id number] DOB: 12/27/1955 Today's Date: 08/16/2018    History of present illness 62 y.o. 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  Pt dx with acute encepolopathy and fever (102).  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  Pt was suspected to have infection.  Neurosurgery consulted and tapped shunt.  CT also showed some lung changes supicious for PNA. Infectious disease following for ventriculits. Neurosurgery surgically removed his shunt and placed a ventricular catheter on 08/12/18.     OT comments  Pt making steady progress toward goals. Pt remains unsteady with mobility, especially when distracted. Improvement noted with use of RW. At this time, feel pt has progressed to DC home with 24/7 S and HHOT. Will continue to follow acutely to facilitate safe DC home.   Follow Up Recommendations  Home health OT;Supervision/Assistance - 24 hour    Equipment Recommendations  3 in 1 bedside commode    Recommendations for Other Services      Precautions / Restrictions Precautions Precautions: Fall       Mobility Bed Mobility Overal bed mobility: Needs Assistance Bed Mobility: Supine to Sit     Supine to sit: Modified independent (Device/Increase time)     General bed mobility comments: OOB in chair  Transfers Overall transfer level: Needs assistance   Transfers: Sit to/from Stand Sit to Stand: Min guard         General transfer comment: LOB posteriorly initially    Balance Overall balance assessment: Needs assistance Sitting-balance support: Feet supported Sitting balance-Leahy Scale: Good       Standing balance-Leahy Scale: Poor Standing balance comment: LOB when distracted                           ADL either performed or assessed with clinical judgement   ADL        Grooming: Standing;Supervision/safety               Lower Body Dressing: Min guard;Sit to/from stand   Toilet Transfer: Minimal assistance;Ambulation   Toileting- Clothing Manipulation and Hygiene: Supervision/safety       Functional mobility during ADLs: Minimal assistance;Rolling walker;Cueing for safety General ADL Comments: Using RW during functional mobility. Pt with occasional LOB to L. Appears improved with use of RW.     Vision   Vision Assessment?: Vision impaired- to be further tested in functional context Additional Comments: diplopia improved but continues to be present at times   Perception     Praxis      Cognition Arousal/Alertness: Awake/alert Behavior During Therapy: WFL for tasks assessed/performed Overall Cognitive Status: Impaired/Different from baseline Area of Impairment: Attention;Awareness                   Current Attention Level: Selective     Safety/Judgement: Decreased awareness of deficits Awareness: Emergent   General Comments: Improving but not yet at baseline        Exercises     Shoulder Instructions       General Comments vss    Pertinent Vitals/ Pain       Pain Assessment: No/denies pain  Home Living  Prior Functioning/Environment              Frequency  Min 2X/week        Progress Toward Goals  OT Goals(current goals can now be found in the care plan section)  Progress towards OT goals: Progressing toward goals  Acute Rehab OT Goals Patient Stated Goal: to get back to normal OT Goal Formulation: With patient/family Time For Goal Achievement: 08/26/18 Potential to Achieve Goals: Good ADL Goals Pt Will Perform Lower Body Bathing: with modified independence;sit to/from stand Pt Will Perform Lower Body Dressing: with modified independence;sit to/from stand Pt Will Transfer to Toilet: with modified independence;ambulating Pt Will  Perform Toileting - Clothing Manipulation and hygiene: with modified independence;sit to/from stand  Plan Discharge plan needs to be updated    Co-evaluation                 AM-PAC PT "6 Clicks" Daily Activity     Outcome Measure   Help from another person eating meals?: None Help from another person taking care of personal grooming?: A Little Help from another person toileting, which includes using toliet, bedpan, or urinal?: A Little Help from another person bathing (including washing, rinsing, drying)?: A Little Help from another person to put on and taking off regular upper body clothing?: A Little Help from another person to put on and taking off regular lower body clothing?: A Little 6 Click Score: 19    End of Session Equipment Utilized During Treatment: Gait belt  OT Visit Diagnosis: Other abnormalities of gait and mobility (R26.89);Other symptoms and signs involving cognitive function;Other (comment)   Activity Tolerance Patient tolerated treatment well   Patient Left in chair;with call bell/phone within reach;with family/visitor present   Nurse Communication Mobility status        Time: 1610-9604 OT Time Calculation (min): 23 min  Charges: OT General Charges $OT Visit: 1 Visit OT Treatments $Self Care/Home Management : 23-37 mins  Luisa Dago, OT/L   Acute OT Clinical Specialist Acute Rehabilitation Services Pager [phone] Office [phone]    [facility] 08/16/2018, 6:33 PM

## 2018-08-16 NOTE — Progress Notes (Signed)
Patient ID: Charles Marquez, male   DOB: 17-Jun-1956, 62 y.o.   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  Original CSF culture grew Klebsiella but repeat specimen on 08/11/2018 shows gram-positive cocci and subsequent culture may be growing a staph species.  His CSF parameters are improving.  Plan on continuing vancomycin and ceftriaxone for at least 14 days total.  PLAN: 1. Continue current antibiotics 2. Please call me for any infectious disease questions this weekend  Active Problems:   Acute encephalopathy   Infection of ventricular shunt (HCC)   Bacterial encephalitis   Hyponatremia   Hypokalemia   Scheduled Meds: . atorvastatin  40 mg Oral Daily  . Chlorhexidine Gluconate Cloth  6 each Topical Daily  . dexamethasone  2 mg Intravenous Q12H  . gentamicin (PF)  8 mg Intrathecal Daily  . insulin aspart  0-15 Units Subcutaneous TID WC  . insulin aspart  0-5 Units Subcutaneous QHS  . sodium chloride flush  10-40 mL Intracatheter Q12H   Continuous Infusions: . sodium chloride 10 mL/hr at 08/16/18 1000  . sodium chloride Stopped (08/16/18 1000)  . cefTRIAXone (ROCEPHIN)  IV Stopped (08/16/18 6578)  . levETIRAcetam Stopped (08/15/18 1752)  . vancomycin     PRN Meds:.acetaminophen **OR** acetaminophen, docusate sodium, labetalol, ondansetron **OR** ondansetron (ZOFRAN) IV, polyethylene glycol, polyvinyl alcohol, promethazine, senna, sodium chloride flush   SUBJECTIVE: He is feeling much better.  He has been up walking in the hall.  He is not having any headache.  Review of Systems: Review of Systems  Constitutional: Negative for chills, diaphoresis and fever.  Gastrointestinal: Negative for abdominal pain, diarrhea, nausea and vomiting.    Neurological: Negative for headaches.    No Known Allergies  OBJECTIVE: Vitals:   08/16/18 0852 08/16/18 1000 08/16/18 1200 08/16/18 1205  BP:  125/84 119/88   Pulse:  75 72   Resp:  13 15   Temp: 97.6 F (36.4 C)   98 F (36.7 C)  TempSrc: Oral   Oral  SpO2:  99% 99%   Weight:      Height:       Body mass index is 26.5 kg/m.  Physical Exam  Constitutional: He is oriented to person, place, and time.  Is very pleasant and in no distress.  He sitting up in a chair visiting with his wife.  HENT:  Extraventricular drain in place.  Neurological: He is alert and oriented to person, place, and time.  Skin: No rash noted.  Psychiatric: He has a normal mood and affect.    Lab Results Lab Results  Component Value Date   WBC 8.3 08/12/2018   HGB 12.1 (L) 08/12/2018   HCT 35.2 (L) 08/12/2018   MCV 91.0 08/12/2018   PLT 185 08/12/2018    Lab Results  Component Value Date   CREATININE 0.91 08/16/2018   BUN 21 (H) 08/16/2018   NA 139 08/16/2018   K 4.1 08/16/2018   CL 106 08/16/2018   CO2 25 08/16/2018    Lab Results  Component Value Date   ALT 14 08/09/2018   AST 17 08/09/2018   ALKPHOS 45 08/09/2018   BILITOT 0.9 08/09/2018  Microbiology: Recent Results (from the past 240 hour(s))  Blood Culture (routine x 2)     Status: None   Collection Time: 08/08/18 10:40 AM  Result Value Ref Range Status   Specimen Description BLOOD RIGHT ARM  Final   Special Requests   Final    BOTTLES DRAWN AEROBIC AND ANAEROBIC Blood Culture results may not be optimal due to an excessive volume of blood received in culture bottles   Culture   Final    NO GROWTH 5 DAYS Performed at [facility], [address] [place], [place] [address]    Report Status 08/13/2018 FINAL  Final  Blood Culture (routine x 2)     Status: None   Collection Time: 08/08/18 10:50 AM  Result Value Ref Range Status   Specimen Description BLOOD RIGHT ANTECUBITAL  Final   Special Requests   Final     BOTTLES DRAWN AEROBIC AND ANAEROBIC Blood Culture adequate volume   Culture   Final    NO GROWTH 5 DAYS Performed at [facility] Lab, [address] [place], [place] [address]    Report Status 08/13/2018 FINAL  Final  CSF culture with Stat gram stain     Status: None   Collection Time: 08/08/18 12:02 PM  Result Value Ref Range Status   Specimen Description CSF SHUNT  Final   Special Requests CLOTTED SPECIMEN  Final   Gram Stain   Final    FEW WBC PRESENT,BOTH PMN AND MONONUCLEAR ABUNDANT RED BLOOD CELLS NO ORGANISMS SEEN    Culture   Final    MODERATE KLEBSIELLA PNEUMONIAE RESULT CALLED TO, READ BACK BY AND VERIFIED WITH: VALDIVIA RN AT 0900 ON [id number] BY SJW Performed at [facility] Lab, [address] [place], [place] [address]    Report Status 08/11/2018 FINAL  Final   Organism ID, Bacteria KLEBSIELLA PNEUMONIAE  Final      Susceptibility   Klebsiella pneumoniae - MIC*    AMPICILLIN RESISTANT Resistant     CEFAZOLIN <=4 SENSITIVE Sensitive     CEFEPIME <=1 SENSITIVE Sensitive     CEFTAZIDIME <=1 SENSITIVE Sensitive     CEFTRIAXONE <=1 SENSITIVE Sensitive     CIPROFLOXACIN <=0.25 SENSITIVE Sensitive     GENTAMICIN <=1 SENSITIVE Sensitive     IMIPENEM 1 SENSITIVE Sensitive     TRIMETH/SULFA <=20 SENSITIVE Sensitive     AMPICILLIN/SULBACTAM 4 SENSITIVE Sensitive     PIP/TAZO <=4 SENSITIVE Sensitive     Extended ESBL NEGATIVE Sensitive     * MODERATE KLEBSIELLA PNEUMONIAE  Urine culture     Status: None   Collection Time: 08/08/18 12:54 PM  Result Value Ref Range Status   Specimen Description URINE, RANDOM  Final   Special Requests NONE  Final   Culture   Final    NO GROWTH Performed at [facility], [address], [place], [place] [address]    Report Status 08/09/2018 FINAL  Final  Surgical pcr screen     Status: None   Collection Time: 08/09/18  1:10 PM  Result Value Ref Range Status   MRSA, PCR NEGATIVE NEGATIVE Final   Staphylococcus aureus  NEGATIVE NEGATIVE Final    Comment: (NOTE) The Xpert SA Assay (FDA approved for NASAL specimens in patients 20 years of age and older), is one component of a comprehensive surveillance program. It is not intended to diagnose infection nor to guide or monitor treatment. Performed at [facility] Lab, [address] [place], [place] [address]   Anaerobic culture     Status: None   Collection Time: 08/09/18  4:04 PM  Result Value Ref Range Status   Specimen Description WOUND  Final   Special Requests   Final    NONE Performed at [facility] Lab, [address], [place], [place] [address]    Culture RARE PROPIONIBACTERIUM ACNES  Final   Report Status 08/15/2018 FINAL  Final  Aerobic Culture (superficial specimen)     Status: None   Collection Time: 08/09/18  4:04 PM  Result Value Ref Range Status   Specimen Description WOUND  Final   Special Requests   Final    NONE Performed at [facility], [address], [place], [place] [address]    Gram Stain NO WBC SEEN NO ORGANISMS SEEN   Final   Culture RARE KLEBSIELLA PNEUMONIAE  Final   Report Status 08/12/2018 FINAL  Final   Organism ID, Bacteria KLEBSIELLA PNEUMONIAE  Final      Susceptibility   Klebsiella pneumoniae - MIC*    AMPICILLIN >=32 RESISTANT Resistant     CEFAZOLIN <=4 SENSITIVE Sensitive     CEFEPIME <=1 SENSITIVE Sensitive     CEFTAZIDIME <=1 SENSITIVE Sensitive     CEFTRIAXONE <=1 SENSITIVE Sensitive     CIPROFLOXACIN <=0.25 SENSITIVE Sensitive     GENTAMICIN <=1 SENSITIVE Sensitive     IMIPENEM 2 SENSITIVE Sensitive     TRIMETH/SULFA <=20 SENSITIVE Sensitive     AMPICILLIN/SULBACTAM 4 SENSITIVE Sensitive     PIP/TAZO <=4 SENSITIVE Sensitive     Extended ESBL NEGATIVE Sensitive     * RARE KLEBSIELLA PNEUMONIAE  CSF culture with Stat gram stain     Status: None   Collection Time: 08/11/18 10:12 AM  Result Value Ref Range Status   Specimen Description CSF  Final   Special Requests NONE  Final    Gram Stain   Final    WBC PRESENT,BOTH PMN AND MONONUCLEAR GRAM POSITIVE COCCI IN PAIRS CRITICAL RESULT CALLED TO, READ BACK BY AND VERIFIED WITH: RN AMANDA TROXLER 08/11/18 AT 1412 BY CM    Culture   Final    NO GROWTH 3 DAYS Performed at [facility], [address], [place], [place] [address]    Report Status 08/14/2018 FINAL  Final  Blood culture (routine x 2)     Status: None   Collection Time: 08/11/18 10:56 AM  Result Value Ref Range Status   Specimen Description BLOOD RIGHT HAND  Final   Special Requests   Final    BOTTLES DRAWN AEROBIC AND ANAEROBIC Blood Culture adequate volume   Culture   Final    NO GROWTH 5 DAYS Performed at [facility], [address], [place], [place] [address]    Report Status 08/16/2018 FINAL  Final  Blood culture (routine x 2)     Status: None   Collection Time: 08/11/18 10:59 AM  Result Value Ref Range Status   Specimen Description BLOOD RIGHT ANTECUBITAL  Final   Special Requests   Final    BOTTLES DRAWN AEROBIC AND ANAEROBIC Blood Culture adequate volume   Culture   Final    NO GROWTH 5 DAYS Performed at [facility], [address] [place], [place] [address]    Report Status 08/16/2018 FINAL  Final  Culture, fungus without smear     Status: None (Preliminary result)   Collection Time: 08/11/18  4:41 PM  Result  Value Ref Range Status   Specimen Description CSF  Final   Special Requests Normal  Final   Culture   Final    NO FUNGUS ISOLATED AFTER 3 DAYS Performed at [facility], [address], [place], [place] [address]    Report Status PENDING  Incomplete  CSF culture     Status: None (Preliminary result)   Collection Time: 08/13/18 10:49 AM  Result Value Ref Range Status   Specimen Description CSF  Final   Special Requests NONE  Final   Gram Stain   Final    WBC PRESENT, PREDOMINANTLY PMN NO ORGANISMS SEEN CYTOSPIN SMEAR    Culture   Final    NO GROWTH 2 DAYS Performed at [facility] Lab,  [address] [place], [place] [address]    Report Status PENDING  Incomplete  CSF culture     Status: None (Preliminary result)   Collection Time: 08/15/18 10:16 AM  Result Value Ref Range Status   Specimen Description CSF  Final   Special Requests NONE  Final   Gram Stain   Final    WBC PRESENT,BOTH PMN AND MONONUCLEAR NO ORGANISMS SEEN CYTOSPIN SMEAR    Culture   Final    NO GROWTH < 24 HOURS Performed at [facility] Lab, [address] [place], [place] [address]    Report Status PENDING  Incomplete    Cliffton Asters, MD [facility] for Infectious Disease [facility] [phone] pager   [phone] cell 08/16/2018, 12:37 PM

## 2018-08-16 NOTE — Progress Notes (Signed)
Physical Therapy Treatment Patient Details Name: Charles Marquez MRN: [id number] DOB: 11/21/1955 Today's Date: 08/16/2018    History of Present Illness 62 y.o. 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  Pt dx with acute encepolopathy and fever (102).  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  Pt was suspected to have infection.  Neurosurgery consulted and tapped shunt.  CT also showed some lung changes supicious for PNA. Infectious disease following for ventriculits. Neurosurgery surgically removed his shunt and placed a ventricular catheter on 08/12/18.      PT Comments    Improving each session.  Still unsteady and in need of assistance frequently within a session, but now able to balance statically and using more ankle and postural strategies to maintain balance longer without as many LOB.  Scanning and directional changes still causing varying degrees of deviation, some pt can self-recover from and some he can't.    Follow Up Recommendations  Outpatient PT;Supervision/Assistance - 24 hour     Equipment Recommendations  None recommended by PT    Recommendations for Other Services       Precautions / Restrictions Precautions Precautions: Fall    Mobility  Bed Mobility Overal bed mobility: Needs Assistance Bed Mobility: Supine to Sit     Supine to sit: Modified independent (Device/Increase time)     General bed mobility comments: aware of the drain  Transfers Overall transfer level: Needs assistance   Transfers: Sit to/from Stand Sit to Stand: Min guard         General transfer comment: more steady, pt aware that he is stabilizing with legs on chair or bed frame.  Ambulation/Gait Ambulation/Gait assistance: Min assist Gait Distance (Feet): 200 Feet(x3 with standing rests to recover from degrading balance) Assistive device: None;IV Pole Gait  Pattern/deviations: Step-through pattern   Gait velocity interpretation: 1.31 - 2.62 ft/sec, indicative of limited community ambulator General Gait Details: Still generally unstead with occasional falling off left and right with stepping and drifting strategies to try to regain balance.  There were many instances that pt could potentially fall during the session and there were instances where pt ambulated with close guard.  [name] balance challenged by speed, directional changes, backing up and scanning.   Stairs             Wheelchair Mobility    Modified Rankin (Stroke Patients Only)       Balance Overall balance assessment: Needs assistance Sitting-balance support: Feet supported;Bilateral upper extremity supported Sitting balance-Leahy Scale: Fair       Standing balance-Leahy Scale: Fair Standing balance comment: static balance managed with increasingly more subtle ankle strategies.                            Cognition Arousal/Alertness: Awake/alert Behavior During Therapy: WFL for tasks assessed/performed Overall Cognitive Status: Within Functional Limits for tasks assessed                     Current Attention Level: Alternating       Awareness: Emergent   General Comments: improving, still looking to wife for confirmation      Exercises      General Comments General comments (skin integrity, edema, etc.): vss      Pertinent Vitals/Pain Pain Assessment: No/denies pain    Home Living  Prior Function            PT Goals (current goals can now be found in the care plan section) Acute Rehab PT Goals Patient Stated Goal: to get back to normal PT Goal Formulation: With patient/family Time For Goal Achievement: 08/23/18 Potential to Achieve Goals: Good Progress towards PT goals: Progressing toward goals    Frequency    Min 3X/week      PT Plan Discharge plan needs to be updated     Co-evaluation              AM-PAC PT "6 Clicks" Daily Activity  Outcome Measure  Difficulty turning over in bed (including adjusting bedclothes, sheets and blankets)?: A Little Difficulty moving from lying on back to sitting on the side of the bed? : A Little Difficulty sitting down on and standing up from a chair with arms (e.g., wheelchair, bedside commode, etc,.)?: A Little Help needed moving to and from a bed to chair (including a wheelchair)?: A Little Help needed walking in hospital room?: A Little Help needed climbing 3-5 steps with a railing? : A Little 6 Click Score: 18    End of Session   Activity Tolerance: Patient tolerated treatment well   Nurse Communication: Mobility status PT Visit Diagnosis: Unsteadiness on feet (R26.81);Difficulty in walking, not elsewhere classified (R26.2)     Time: 1610-9604 PT Time Calculation (min) (ACUTE ONLY): 22 min  Charges:  $Gait Training: 8-22 mins                     08/16/2018  [place], PT Acute Rehabilitation Services [phone]  (pager) [phone]  (office)   Eliseo Gum Shashank Kwasnik 08/16/2018, 4:20 PM

## 2018-08-16 NOTE — Progress Notes (Signed)
Overall stable.  No headache.  No diplopia.  Cognition at baseline.  Motor and sensory function intact.  CSF still with a lot of particulate matter but overall fluid less viscous.  Follow-up head CT scan today with no evidence of progressive hydrocephalus although I do have some worry that the right temporal horn may become entrapped.  CSF from yesterday with declining cell count and protein level.  Continue intraventricular gentamicin.  Continue IV Rocephin and vancomycin.  Continue external drainage.  Start to wean off steroids.  May possibly remove ventriculostomy and distal catheter on Tuesday with tentative plans for shunt replacement on Friday.

## 2018-08-17 LAB — BASIC METABOLIC PANEL
ANION GAP: 9 (ref 5–15)
BUN: 19 mg/dL (ref 6–20)
CALCIUM: 8.3 mg/dL — AB (ref 8.9–10.3)
CO2: 27 mmol/L (ref 22–32)
Chloride: 103 mmol/L (ref 98–111)
Creatinine, Ser: 0.82 mg/dL (ref 0.61–1.24)
GFR calc non Af Amer: >60 mL/min (ref >60)
Glucose, Bld: 110 mg/dL — ABNORMAL HIGH (ref 70–99)
POTASSIUM: 4.1 mmol/L (ref 3.5–5.1)
Sodium: 139 mmol/L (ref 135–145)

## 2018-08-17 LAB — CSF CULTURE: CULTURE: NO GROWTH

## 2018-08-17 LAB — GLUCOSE, CAPILLARY
GLUCOSE-CAPILLARY: 104 mg/dL — AB (ref 70–99)
Glucose-Capillary: 97 mg/dL (ref 70–99)
Glucose-Capillary: 96 mg/dL (ref 70–99)
Glucose-Capillary: 149 mg/dL — ABNORMAL HIGH (ref 70–99)

## 2018-08-17 LAB — PATHOLOGIST SMEAR REVIEW

## 2018-08-17 LAB — CSF CULTURE W GRAM STAIN

## 2018-08-17 MED ORDER — NYSTATIN 100000 UNIT/ML MT SUSP
5.0000 mL | Freq: Four times a day (QID) | OROMUCOSAL | Status: DC
  Administered 2018-08-17 – 2018-08-22 (×20): 500000 [IU] via ORAL
  Filled 2018-08-17 (×19): qty 5

## 2018-08-17 NOTE — Progress Notes (Signed)
   Subjective: No overnight events. Charles Marquez reports that his double vision returns at night when he is tired, but he has single vision currently. He is looking forward to breakfast and has no new symptoms or concerns.  Objective:  Vital signs in last 24 hours: Vitals:   08/17/18 0000 08/17/18 0200 08/17/18 0400 08/17/18 0600  BP: (!) 142/82 (!) 132/96 (!) 123/97 (!) 127/91  Pulse: (!) 58 63 (!) 57 60  Resp: 13 16 11 11   Temp: 98.4 F (36.9 C)  97.9 F (36.6 C)   TempSrc: Oral  Oral   SpO2: 97% 98% 96% 98%  Weight:      Height:       Physical exam Gen: lying comfortably in bed, no distress HENT: EVD drainingturbid yellowCSF. Few small tonsillar exudates and white coating on tongue. Neuro:Alert and oriented x3. Minimal gaze-directed nystagmus.Resting tremor.  Abd: Soft, non-distended, non-tender. Ext: No lower extremity edema Skin: warm and dry.  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 He was transitioned to ceftriaxone from cefepime for better CNS coverage given the sensitivity results of the specimen on 08/10/2018.Vancomycin was later added when Gram positive cocci in pairs grew in his CSF.Started on intraventricular gentamicin on 10/1 for ongoing high cell count in the CSF.  Encephalitis/ventriculitisand communicating hydrocephalus - Patientcontinues to feel well. Double vision has improved. - Repeat CSFcultures from 10/4 have shownno growth to date. - Vancomycin and ceftriaxone for a total of 14  days. Continuing gentamycin for 2 more days.  -Weaning the decadron (currently down to 2mg  q12hrs) - Patient continues to make progress with PT and OT - Tentative plan for removal of EVD on Tuesday 10/8 and replacement of internal shunt Friday 10/11 pending CSF clearance. Plan -Continue management per neurosurgery and ID teams - Consider changing Keppra and Decadron from IV to PO    Hyperglycemia - No history of DM. Hyperglycemic secondary todecadron.His hyperglycemia should improve as he is weaned off the Decadron. 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 Plan -2L fluid restriction  Thrush - Exam today shows mild tonsillar exudates and a white tongue coating. This is most likely thush in the setting of high dose steroids. Plan - Nystatin rinse  Dispo: Anticipated dischargein approximatelyhowever many days until he is stable.  Charles Marquez, Charles Corti, MD 08/17/2018, 6:52 AM Pager: [phone]

## 2018-08-17 NOTE — Progress Notes (Signed)
Patient ID: Charles Marquez, male   DOB: 1956-07-11, 62 y.o.   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  Original CSF culture grew Klebsiella but repeat specimen on 08/11/2018 showed gram-positive cocci.  However, that CSF culture and 2 subsequent cultures are all negative. I plan on continuing vancomycin and ceftriaxone for at least 14 days total.  PLAN: 1. Continue current antibiotics 2. Please call me for any infectious disease questions this weekend  Active Problems:   Acute encephalopathy   Infection of ventricular shunt (HCC)   Bacterial encephalitis   Hyponatremia   Hypokalemia   Scheduled Meds: . atorvastatin  40 mg Oral Daily  . Chlorhexidine Gluconate Cloth  6 each Topical Daily  . dexamethasone  2 mg Intravenous Q12H  . gentamicin (PF)  8 mg Intrathecal Daily  . insulin aspart  0-15 Units Subcutaneous TID WC  . insulin aspart  0-5 Units Subcutaneous QHS  . nystatin  5 mL Oral QID  . sodium chloride flush  10-40 mL Intracatheter Q12H   Continuous Infusions: . sodium chloride 10 mL/hr at 08/17/18 1100  . sodium chloride Stopped (08/16/18 1000)  . cefTRIAXone (ROCEPHIN)  IV Stopped (08/17/18 0410)  . levETIRAcetam 500 mg (08/17/18 0524)  . vancomycin Stopped (08/17/18 1152)   PRN Meds:.acetaminophen **OR** acetaminophen, docusate sodium, labetalol, ondansetron **OR** ondansetron (ZOFRAN) IV, polyethylene glycol, polyvinyl alcohol, promethazine, senna, sodium chloride flush   SUBJECTIVE: He is feeling much better. He is not having any headache.  Review of Systems: Review of Systems  Constitutional: Negative for chills, diaphoresis and fever.  Gastrointestinal: Negative for abdominal pain, diarrhea, nausea and vomiting.    Neurological: Negative for headaches.    No Known Allergies  OBJECTIVE: Vitals:   08/17/18 0600 08/17/18 0800 08/17/18 1000 08/17/18 1200  BP: (!) 127/91 112/78 106/77 117/82  Pulse: 60 83 81 69  Resp: 11 14 14 10   Temp:  97.6 F (36.4 C)  97.7 F (36.5 C)  TempSrc:  Oral  Oral  SpO2: 98% 100% 99% 97%  Weight:      Height:       Body mass index is 26.5 kg/m.  Physical Exam  Constitutional: He is oriented to person, place, and time.  Is very pleasant and in no distress.  He sitting up in a chair.  HENT:  Extraventricular drain in place.  Neurological: He is alert and oriented to person, place, and time.  Skin: No rash noted.  Psychiatric: He has a normal mood and affect.    Lab Results Lab Results  Component Value Date   WBC 8.3 08/12/2018   HGB 12.1 (L) 08/12/2018   HCT 35.2 (L) 08/12/2018   MCV 91.0 08/12/2018   PLT 185 08/12/2018    Lab Results  Component Value Date   CREATININE 0.82 08/17/2018   BUN 19 08/17/2018   NA 139 08/17/2018   K 4.1 08/17/2018   CL 103 08/17/2018   CO2 27 08/17/2018    Lab Results  Component Value Date   ALT 14 08/09/2018   AST 17 08/09/2018   ALKPHOS 45 08/09/2018   BILITOT 0.9 08/09/2018     Microbiology: Recent Results (from the past  240 hour(s))  Blood Culture (routine x 2)     Status: None   Collection Time: 08/08/18 10:40 AM  Result Value Ref Range Status   Specimen Description BLOOD RIGHT ARM  Final   Special Requests   Final    BOTTLES DRAWN AEROBIC AND ANAEROBIC Blood Culture results may not be optimal due to an excessive volume of blood received in culture bottles   Culture   Final    NO GROWTH 5 DAYS Performed at [facility], [address] [place], [place] [address]    Report Status 08/13/2018 FINAL  Final  Blood Culture (routine x 2)     Status: None   Collection Time: 08/08/18 10:50 AM  Result Value Ref Range Status   Specimen Description BLOOD RIGHT ANTECUBITAL  Final   Special Requests    Final    BOTTLES DRAWN AEROBIC AND ANAEROBIC Blood Culture adequate volume   Culture   Final    NO GROWTH 5 DAYS Performed at [facility], [address], [place], [place] [address]    Report Status 08/13/2018 FINAL  Final  CSF culture with Stat gram stain     Status: None   Collection Time: 08/08/18 12:02 PM  Result Value Ref Range Status   Specimen Description CSF SHUNT  Final   Special Requests CLOTTED SPECIMEN  Final   Gram Stain   Final    FEW WBC PRESENT,BOTH PMN AND MONONUCLEAR ABUNDANT RED BLOOD CELLS NO ORGANISMS SEEN    Culture   Final    MODERATE KLEBSIELLA PNEUMONIAE RESULT CALLED TO, READ BACK BY AND VERIFIED WITH: VALDIVIA RN AT 0900 ON [id number] BY SJW Performed at [facility], [address], [place], [place] [address]    Report Status 08/11/2018 FINAL  Final   Organism ID, Bacteria KLEBSIELLA PNEUMONIAE  Final      Susceptibility   Klebsiella pneumoniae - MIC*    AMPICILLIN RESISTANT Resistant     CEFAZOLIN <=4 SENSITIVE Sensitive     CEFEPIME <=1 SENSITIVE Sensitive     CEFTAZIDIME <=1 SENSITIVE Sensitive     CEFTRIAXONE <=1 SENSITIVE Sensitive     CIPROFLOXACIN <=0.25 SENSITIVE Sensitive     GENTAMICIN <=1 SENSITIVE Sensitive     IMIPENEM 1 SENSITIVE Sensitive     TRIMETH/SULFA <=20 SENSITIVE Sensitive     AMPICILLIN/SULBACTAM 4 SENSITIVE Sensitive     PIP/TAZO <=4 SENSITIVE Sensitive     Extended ESBL NEGATIVE Sensitive     * MODERATE KLEBSIELLA PNEUMONIAE  Urine culture     Status: None   Collection Time: 08/08/18 12:54 PM  Result Value Ref Range Status   Specimen Description URINE, RANDOM  Final   Special Requests NONE  Final   Culture   Final    NO GROWTH Performed at [facility], [address] [place], [place] [address]    Report Status 08/09/2018 FINAL  Final  Surgical pcr screen     Status: None   Collection Time: 08/09/18  1:10 PM  Result Value Ref Range Status   MRSA, PCR NEGATIVE NEGATIVE Final   Staphylococcus  aureus NEGATIVE NEGATIVE Final    Comment: (NOTE) The Xpert SA Assay (FDA approved for NASAL specimens in patients 60 years of age and older), is one component of a comprehensive surveillance program. It is not intended to diagnose infection nor to guide or monitor treatment. Performed at [facility], [address] [place], [place] [address]  Anaerobic culture     Status: None   Collection Time: 08/09/18  4:04 PM  Result Value Ref Range Status   Specimen Description WOUND  Final   Special Requests   Final    NONE Performed at [facility], [address], [place], [place] [address]    Culture RARE PROPIONIBACTERIUM ACNES  Final   Report Status 08/15/2018 FINAL  Final  Aerobic Culture (superficial specimen)     Status: None   Collection Time: 08/09/18  4:04 PM  Result Value Ref Range Status   Specimen Description WOUND  Final   Special Requests   Final    NONE Performed at [facility], [address] [place], [place] [address]    Gram Stain NO WBC SEEN NO ORGANISMS SEEN   Final   Culture RARE KLEBSIELLA PNEUMONIAE  Final   Report Status 08/12/2018 FINAL  Final   Organism ID, Bacteria KLEBSIELLA PNEUMONIAE  Final      Susceptibility   Klebsiella pneumoniae - MIC*    AMPICILLIN >=32 RESISTANT Resistant     CEFAZOLIN <=4 SENSITIVE Sensitive     CEFEPIME <=1 SENSITIVE Sensitive     CEFTAZIDIME <=1 SENSITIVE Sensitive     CEFTRIAXONE <=1 SENSITIVE Sensitive     CIPROFLOXACIN <=0.25 SENSITIVE Sensitive     GENTAMICIN <=1 SENSITIVE Sensitive     IMIPENEM 2 SENSITIVE Sensitive     TRIMETH/SULFA <=20 SENSITIVE Sensitive     AMPICILLIN/SULBACTAM 4 SENSITIVE Sensitive     PIP/TAZO <=4 SENSITIVE Sensitive     Extended ESBL NEGATIVE Sensitive     * RARE KLEBSIELLA PNEUMONIAE  CSF culture with Stat gram stain     Status: None   Collection Time: 08/11/18 10:12 AM  Result Value Ref Range Status   Specimen Description CSF  Final   Special Requests NONE   Final   Gram Stain   Final    WBC PRESENT,BOTH PMN AND MONONUCLEAR GRAM POSITIVE COCCI IN PAIRS CRITICAL RESULT CALLED TO, READ BACK BY AND VERIFIED WITH: RN AMANDA TROXLER 08/11/18 AT 1412 BY CM    Culture   Final    NO GROWTH 3 DAYS Performed at [facility], [address], [place], [place] [address]    Report Status 08/14/2018 FINAL  Final  Blood culture (routine x 2)     Status: None   Collection Time: 08/11/18 10:56 AM  Result Value Ref Range Status   Specimen Description BLOOD RIGHT HAND  Final   Special Requests   Final    BOTTLES DRAWN AEROBIC AND ANAEROBIC Blood Culture adequate volume   Culture   Final    NO GROWTH 5 DAYS Performed at [facility], [address] [place], [place] [address]    Report Status 08/16/2018 FINAL  Final  Blood culture (routine x 2)     Status: None   Collection Time: 08/11/18 10:59 AM  Result Value Ref Range Status   Specimen Description BLOOD RIGHT ANTECUBITAL  Final   Special Requests   Final    BOTTLES DRAWN AEROBIC AND ANAEROBIC Blood Culture adequate volume   Culture   Final    NO GROWTH 5 DAYS Performed at [facility], [address] [place], [place] [address]    Report Status 08/16/2018 FINAL  Final  Culture, fungus without smear     Status: None (Preliminary result)   Collection Time: 08/11/18  4:41 PM  Result Value Ref Range Status  Specimen Description CSF  Final   Special Requests Normal  Final   Culture   Final    NO FUNGUS ISOLATED AFTER 3 DAYS Performed at [facility], [address] [place], [place] [address]    Report Status PENDING  Incomplete  CSF culture     Status: None   Collection Time: 08/13/18 10:49 AM  Result Value Ref Range Status   Specimen Description CSF  Final   Special Requests NONE  Final   Gram Stain   Final    WBC PRESENT, PREDOMINANTLY PMN NO ORGANISMS SEEN CYTOSPIN SMEAR    Culture   Final    NO GROWTH 3 DAYS Performed at [facility], [address], [place], [place] [address]    Report Status 08/17/2018 FINAL  Final  CSF culture     Status: None (Preliminary result)   Collection Time: 08/15/18 10:16 AM  Result Value Ref Range Status   Specimen Description CSF  Final   Special Requests NONE  Final   Gram Stain   Final    WBC PRESENT,BOTH PMN AND MONONUCLEAR NO ORGANISMS SEEN CYTOSPIN SMEAR    Culture   Final    NO GROWTH 2 DAYS Performed at [facility], [address] [place], [place] [address]    Report Status PENDING  Incomplete    Cliffton Asters, MD [facility] [phone] pager   [phone] cell 08/17/2018, 12:39 PM

## 2018-08-17 NOTE — Progress Notes (Signed)
Overall stable.  No new problems overnight.  No headache.  No current diplopia.  Mental status at or close to baseline.  Still little unsteady on feet but overall improved.  Afebrile.  Vital signs are stable.  Ventricular output still low.  Awake and alert.  Oriented and appropriate.  Motor and sensory function intact.  Cranial nerve function normal bilaterally.  Status post VP shunt infection with ongoing ventriculitis.  Cultures negative although culture from October 1 has been reintubated and may possibly yield an organism.  Continue current antibiotics.  Patient given his last dose of intraventricular gentamicin today.  Plan to send CSF tomorrow.  Re-CT scan on Monday.  Consider ventriculostomy removal on Tuesday and observation with drain out.

## 2018-08-17 NOTE — Progress Notes (Signed)
Physical Therapy Treatment Patient Details Name: Charles Marquez MRN: [id number] DOB: Jul 21, 1956 Today's Date: 08/17/2018    History of Present Illness 62 y.o. 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  Pt dx with acute encepolopathy and fever (102).  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  Pt was suspected to have infection.  Neurosurgery consulted and tapped shunt.  CT also showed some lung changes supicious for PNA. Infectious disease following for ventriculits. Neurosurgery surgically removed his shunt and placed a ventricular catheter on 08/12/18.      PT Comments    Pt continues to progress towards goals, working on higher level gait without an AD.  Min assist when challenged, but better stability overall.  He feels better (and looks more stable with IV pole).  Would benefit from trying a cane to see if that provides enough stability in preparation towards d/c.  Will try cane next session.  PT will continue to follow acutely for safe mobility progression   Follow Up Recommendations  Outpatient PT;Supervision/Assistance - 24 hour     Equipment Recommendations  None recommended by PT    Recommendations for Other Services   NA     Precautions / Restrictions Precautions Precautions: Fall    Mobility  Bed Mobility               General bed mobility comments: Pt is OOB in chair  Transfers Overall transfer level: Needs assistance   Transfers: Sit to/from Stand Sit to Stand: Min guard         General transfer comment: /p aware of lines, waiting for therapist to be ready, minguard assist for safety.    Ambulation/Gait Ambulation/Gait assistance: Min assist   Assistive device: None;IV Pole Gait Pattern/deviations: Step-through pattern     General Gait Details: Mildly staggering gait pattern , more significant LOB when challenged        Balance Overall  balance assessment: Needs assistance Sitting-balance support: Feet supported;Bilateral upper extremity supported Sitting balance-Leahy Scale: Fair       Standing balance-Leahy Scale: Fair Standing balance comment: supervision statically with wide BOS             High level balance activites: Backward walking;Direction changes;Turns;Sudden stops;Head turns High Level Balance Comments: min assist for challenges Standardized Balance Assessment Standardized Balance Assessment : Dynamic Gait Index   Dynamic Gait Index Level Surface: Moderate Impairment Change in Gait Speed: Moderate Impairment Gait with Horizontal Head Turns: Severe Impairment Gait with Vertical Head Turns: Severe Impairment Gait and Pivot Turn: Severe Impairment Step Over Obstacle: Severe Impairment      Cognition Arousal/Alertness: Awake/alert Behavior During Therapy: WFL for tasks assessed/performed Overall Cognitive Status: Impaired/Different from baseline                     Current Attention Level: Alternating       Awareness: Emergent   General Comments: improving, still looking to son and therapist for confirmation.              Prior Function            PT Goals (current goals can now be found in the care plan section) Acute Rehab PT Goals Patient Stated Goal: to get back to normal Progress towards PT goals: Not progressing toward goals - comment    Frequency    Min 3X/week      PT Plan Current plan  remains appropriate       AM-PAC PT "6 Clicks" Daily Activity  Outcome Measure  Difficulty turning over in bed (including adjusting bedclothes, sheets and blankets)?: A Little Difficulty moving from lying on back to sitting on the side of the bed? : A Little Difficulty sitting down on and standing up from a chair with arms (e.g., wheelchair, bedside commode, etc,.)?: Unable Help needed moving to and from a bed to chair (including a wheelchair)?: A Little Help needed  walking in hospital room?: A Little Help needed climbing 3-5 steps with a railing? : A Little 6 Click Score: 16    End of Session Equipment Utilized During Treatment: Gait belt Activity Tolerance: Patient tolerated treatment well Patient left: in chair;with call bell/phone within reach;with family/visitor present   PT Visit Diagnosis: Unsteadiness on feet (R26.81);Difficulty in walking, not elsewhere classified (R26.2)     Time: 1610-9604 PT Time Calculation (min) (ACUTE ONLY): 17 min  Charges:  $Gait Training: 8-22 mins               Tranise Forrest B. Elsia [place], PT, DPT  Acute Rehabilitation [phone] pager #[phone] office             08/17/2018, 12:57 PM

## 2018-08-18 LAB — CSF CULTURE W GRAM STAIN

## 2018-08-18 LAB — BASIC METABOLIC PANEL
ANION GAP: 7 (ref 5–15)
BUN: 19 mg/dL (ref 6–20)
CHLORIDE: 104 mmol/L (ref 98–111)
CO2: 26 mmol/L (ref 22–32)
CREATININE: 0.85 mg/dL (ref 0.61–1.24)
Calcium: 8.1 mg/dL — ABNORMAL LOW (ref 8.9–10.3)
GFR calc non Af Amer: >60 mL/min (ref >60)
Glucose, Bld: 117 mg/dL — ABNORMAL HIGH (ref 70–99)
Potassium: 4.2 mmol/L (ref 3.5–5.1)
SODIUM: 137 mmol/L (ref 135–145)

## 2018-08-18 LAB — VANCOMYCIN, TROUGH: VANCOMYCIN TR: 14 ug/mL — AB (ref 15–20)

## 2018-08-18 LAB — GLUCOSE, CAPILLARY
GLUCOSE-CAPILLARY: 92 mg/dL (ref 70–99)
Glucose-Capillary: 118 mg/dL — ABNORMAL HIGH (ref 70–99)
Glucose-Capillary: 133 mg/dL — ABNORMAL HIGH (ref 70–99)
Glucose-Capillary: 128 mg/dL — ABNORMAL HIGH (ref 70–99)

## 2018-08-18 LAB — CSF CULTURE: CULTURE: NO GROWTH

## 2018-08-18 MED ORDER — VANCOMYCIN HCL 10 G IV SOLR
1500.0000 mg | Freq: Two times a day (BID) | INTRAVENOUS | Status: DC
  Administered 2018-08-19 (×3): 1500 mg via INTRAVENOUS
  Filled 2018-08-18 (×4): qty 1500

## 2018-08-18 MED ORDER — DEXAMETHASONE 2 MG PO TABS
2.0000 mg | ORAL_TABLET | Freq: Two times a day (BID) | ORAL | Status: DC
  Administered 2018-08-18 – 2018-08-19 (×3): 2 mg via ORAL
  Filled 2018-08-18 (×3): qty 1

## 2018-08-18 NOTE — Progress Notes (Signed)
   Subjective: patient was sitting up in the recliner chair and appeared comfortable. He denied any issues overnight.  Objective:  Vital signs in last 24 hours: Vitals:   08/18/18 0800 08/18/18 1200 08/18/18 1400 08/18/18 1600  BP: (!) 123/96 103/84 112/67 108/73  Pulse: (!) 58 74 81 75  Resp: 13 11 15 16   Temp: 97.9 F (36.6 C) 98.1 F (36.7 C)  98.2 F (36.8 C)  TempSrc: Oral Oral  Oral  SpO2: 95% 97% 97% 98%  Weight:      Height:       Physical Exam  Constitutional: He is well-developed, well-nourished, and in no distress.  HENT:  Mouth/Throat: Oropharynx is clear and moist.  Cardiovascular: Normal rate, regular rhythm and normal heart sounds. Exam reveals no gallop and no friction rub.  No murmur heard. Pulmonary/Chest: Effort normal and breath sounds normal. No respiratory distress. He has no wheezes. He has no rales.  Neurological:  Nystagmus noted     Assessment/Plan:  Active Problems:   Acute encephalopathy   Infection of ventricular shunt (HCC)   Bacterial encephalitis   Hyponatremia   Hypokalemia  Encephalitis/ventriculitis and communicating hydrocephalus Patient has improved greatly throughout admission. Currently on vancomycin and ceftriaxone for a total 14 days Mrs. Currently day 8. And gentamicin for 1 more day. Repeat CSF culture is pending. ID following.  Continues to have minimal nystagmus on exam.  There is tentative plan for removal of EVD on Tuesday 10/8 and replacement of internal shunt Friday 10/11 pending CSF clearance.  Decadron has been changed from IV to PO -Vancomycin and ceftriaxone -decadron  Hyperglycemia No history of DM. Hyperglycemicsecondary todecadron.His hyperglycemia is well controlled on SSI.  should improve as he is weaned off the Decadron. 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 -2L fluid restriction  Thrush Exam today does not  show thrush. Use nystatin PRN - Nystatin rinse prn  At this time hyponatremia and hyperglycemia has been stable. Internal medicine will sign off. Please call with any questions.  Geralyn Corwin [place], DO 08/18/2018, 7:00 PM Pager: [phone]

## 2018-08-18 NOTE — Progress Notes (Signed)
No new issues or problems.  Patient denies headache or other issue.  Afebrile.  Vital signs are stable.  He is awake and alert.  He is oriented and appropriate.  Ventriculostomy with minimal output.  CSF sent to lab again today.  Status post gram-negative ventriculitis.  Patient has responded well to antibiotics.  Plan follow-up CT scan in morning.  If CT scan stable then I would withdraw the ventriculostomy and his distal catheter and treat him with antibiotics with his tubes out.  Plan to follow him in the hospital for the next few days after catheter removal.  If patient becomes symptomatic from his hydrocephalus we may consider shunt replacement later in the week if he can be treated without shunt replacement for the next couple weeks so much the better.

## 2018-08-18 NOTE — Progress Notes (Signed)
Pharmacy Antibiotic Note  Charles Marquez is a 62 y.o. male admitted on 08/08/2018 with nausea and memory loss. Patient remains on antibioitcs for meningitis and ventriculitis. Vancomycin trough was drawn today. It returned slightly subtherapeutic. Given the severity of infection and location, I will increase the dose. Based on calculations, an increase in vancomycin to 1500/12h should produce a therapeutic level ~17. Scr remains stable.  Plan: Cancomycin 1500 mg IV q12h Continue ceftriaxone 2g/12h F/u renal fxn, C&S, clinical status and trough at SS   Height: 5\' 11"  (180.3 cm) Weight: 190 lb (86.2 kg) IBW/kg (Calculated) : 75.3  Temp (24hrs), Avg:97.9 F (36.6 C), Min:97.6 F (36.4 C), Max:98.2 F (36.8 C)  Recent Labs  Lab 08/12/18 0310  08/14/18 0309 08/14/18 1255 08/15/18 0915 08/16/18 0344 08/17/18 0523 08/18/18 0303 08/18/18 1009  WBC 8.3  --   --   --   --   --   --   --   --   CREATININE 0.78   < > 0.90  --  0.84 0.91 0.82 0.85  --   VANCOTROUGH  --    < >  --  22*  --   --   --   --  14*   < > = values in this interval not displayed.      Antimicrobials this admission: Vanc 9/26>>9/27 >> 9/29 >> Cefepime 9/26 >> 9/28 Rocephin 9/28 >> IT Gentamicin 10/1, 10/3 > 10/5  Microbiology results: 9/29 BCx2>>ngtd  9/29 CSF >> NGTD 9/26 BC x 2 >>NEG 9/26 CSF shunt - > Klebsiella pneumonia  9/27 Wound -> Klebsiella pneumonia  9/26 urine - > NG MRSA PCR - NEG   Baldemar Friday 08/18/2018 1:59 PM

## 2018-08-19 ENCOUNTER — Inpatient Hospital Stay (HOSPITAL_COMMUNITY): Payer: BLUE CROSS/BLUE SHIELD

## 2018-08-19 DIAGNOSIS — B961 Klebsiella pneumoniae [K. pneumoniae] as the cause of diseases classified elsewhere: Secondary | ICD-10-CM

## 2018-08-19 LAB — GLUCOSE, CAPILLARY
GLUCOSE-CAPILLARY: 159 mg/dL — AB (ref 70–99)
Glucose-Capillary: 97 mg/dL (ref 70–99)
Glucose-Capillary: 91 mg/dL (ref 70–99)
Glucose-Capillary: 100 mg/dL — ABNORMAL HIGH (ref 70–99)

## 2018-08-19 LAB — CSF CELL COUNT WITH DIFFERENTIAL
Lymphs, CSF: 14 % — ABNORMAL LOW (ref 40–80)
MONOCYTE-MACROPHAGE-SPINAL FLUID: 9 % — AB (ref 15–45)
RBC COUNT CSF: 12000 /mm3 — AB
SEGMENTED NEUTROPHILS-CSF: 77 % — AB (ref 0–6)
Tube #: 1
WBC CSF: 1261 /mm3 — AB (ref 0–5)

## 2018-08-19 LAB — PATHOLOGIST SMEAR REVIEW

## 2018-08-19 LAB — BASIC METABOLIC PANEL
Anion gap: 7 (ref 5–15)
BUN: 17 mg/dL (ref 6–20)
CALCIUM: 8 mg/dL — AB (ref 8.9–10.3)
CO2: 27 mmol/L (ref 22–32)
CREATININE: 0.76 mg/dL (ref 0.61–1.24)
Chloride: 104 mmol/L (ref 98–111)
Glucose, Bld: 110 mg/dL — ABNORMAL HIGH (ref 70–99)
Potassium: 4.5 mmol/L (ref 3.5–5.1)
SODIUM: 138 mmol/L (ref 135–145)

## 2018-08-19 MED ORDER — LIDOCAINE HCL (PF) 1 % IJ SOLN
5.0000 mL | Freq: Once | INTRAMUSCULAR | Status: AC
  Administered 2018-08-19: 5 mL via INTRADERMAL

## 2018-08-19 MED ORDER — DEXAMETHASONE 2 MG PO TABS
1.0000 mg | ORAL_TABLET | Freq: Two times a day (BID) | ORAL | Status: DC
  Administered 2018-08-19 – 2018-08-20 (×2): 1 mg via ORAL
  Filled 2018-08-19 (×2): qty 1

## 2018-08-19 MED ORDER — LIDOCAINE HCL (PF) 1 % IJ SOLN
INTRAMUSCULAR | Status: AC
  Administered 2018-08-19: 5 mL via INTRADERMAL
  Filled 2018-08-19: qty 5

## 2018-08-19 MED ORDER — MIDAZOLAM HCL 2 MG/2ML IJ SOLN
2.0000 mg | Freq: Once | INTRAMUSCULAR | Status: AC
  Administered 2018-08-19: 2 mg via INTRAVENOUS

## 2018-08-19 MED ORDER — MIDAZOLAM HCL 2 MG/2ML IJ SOLN
INTRAMUSCULAR | Status: AC
  Administered 2018-08-19: 2 mg via INTRAVENOUS
  Filled 2018-08-19: qty 2

## 2018-08-19 NOTE — Progress Notes (Signed)
INFECTIOUS DISEASE PROGRESS NOTE  ID: Charles Marquez is a 62 y.o. male with  Active Problems:   Acute encephalopathy   Infection of ventricular shunt (HCC)   Bacterial encephalitis   Hyponatremia   Hypokalemia  Subjective: No c/o No further diplopia  Abtx:  Anti-infectives (From admission, onward)   Start     Dose/Rate Route Frequency Ordered Stop   08/18/18 2300  vancomycin (VANCOCIN) 1,500 mg in sodium chloride 0.9 % 500 mL IVPB     1,500 mg 250 mL/hr over 120 Minutes Intravenous Every 12 hours 08/18/18 1357     08/16/18 2300  vancomycin (VANCOCIN) 1,250 mg in sodium chloride 0.9 % 250 mL IVPB  Status:  Discontinued     1,250 mg 166.7 mL/hr over 90 Minutes Intravenous Every 12 hours 08/16/18 1109 08/18/18 1357   08/16/18 1000  gentamicin (PF) (GARAMYCIN) injection 8 mg  Status:  Discontinued     8 mg Intrathecal Daily 08/15/18 1024 08/18/18 1129   08/15/18 1015  gentamicin (PF) (GARAMYCIN) injection 8 mg     8 mg Intrathecal STAT 08/15/18 1008 08/15/18 1214   08/14/18 2300  vancomycin (VANCOCIN) 1,250 mg in sodium chloride 0.9 % 250 mL IVPB  Status:  Discontinued     1,250 mg 166.7 mL/hr over 90 Minutes Intravenous Every 12 hours 08/14/18 1400 08/16/18 1109   08/13/18 1500  gentamicin (PF) (GARAMYCIN) injection 8 mg     8 mg Intrathecal STAT 08/13/18 1446 08/13/18 1656   08/11/18 2200  vancomycin (VANCOCIN) IVPB 1000 mg/200 mL premix  Status:  Discontinued     1,000 mg 200 mL/hr over 60 Minutes Intravenous Every 8 hours 08/11/18 1350 08/14/18 1353   08/11/18 1415  vancomycin (VANCOCIN) 1,500 mg in sodium chloride 0.9 % 500 mL IVPB     1,500 mg 250 mL/hr over 120 Minutes Intravenous  Once 08/11/18 1350 08/11/18 1613   08/10/18 1600  cefTRIAXone (ROCEPHIN) 2 g in sodium chloride 0.9 % 100 mL IVPB     2 g 200 mL/hr over 30 Minutes Intravenous Every 12 hours 08/10/18 1025     08/09/18 1427  bacitracin 50,000 Units in sodium chloride 0.9 % 500 mL irrigation  Status:   Discontinued       As needed 08/09/18 1427 08/09/18 1639   08/09/18 0000  vancomycin (VANCOCIN) IVPB 1000 mg/200 mL premix  Status:  Discontinued     1,000 mg 200 mL/hr over 60 Minutes Intravenous Every 8 hours 08/08/18 1534 08/09/18 0952   08/08/18 2330  ceFEPIme (MAXIPIME) 2 g in sodium chloride 0.9 % 100 mL IVPB  Status:  Discontinued     2 g 200 mL/hr over 30 Minutes Intravenous Every 8 hours 08/08/18 1534 08/10/18 1025   08/08/18 1515  ceFEPIme (MAXIPIME) 2 g in sodium chloride 0.9 % 100 mL IVPB     2 g 200 mL/hr over 30 Minutes Intravenous  Once 08/08/18 1509 08/08/18 1832   08/08/18 1515  vancomycin (VANCOCIN) 1,500 mg in sodium chloride 0.9 % 500 mL IVPB  Status:  Discontinued     1,500 mg 250 mL/hr over 120 Minutes Intravenous  Once 08/08/18 1509 08/09/18 1223      Medications:  Scheduled: . atorvastatin  40 mg Oral Daily  . Chlorhexidine Gluconate Cloth  6 each Topical Daily  . dexamethasone  2 mg Oral Q12H  . insulin aspart  0-15 Units Subcutaneous TID WC  . insulin aspart  0-5 Units Subcutaneous QHS  . nystatin  5  mL Oral QID  . sodium chloride flush  10-40 mL Intracatheter Q12H    Objective: Vital signs in last 24 hours: Temp:  [97.5 F (36.4 C)-98.3 F (36.8 C)] 97.8 F (36.6 C) (10/07 0800) Pulse Rate:  [55-81] 59 (10/07 0835) Resp:  [11-16] 15 (10/07 0835) BP: (103-114)/(67-84) 113/82 (10/07 0835) SpO2:  [95 %-98 %] 98 % (10/07 0835)   General appearance: alert, cooperative and no distress Head: wound dressed, non-tender.  Resp: clear to auscultation bilaterally Cardio: regular rate and rhythm GI: normal findings: bowel sounds normal and soft, non-tender Extremities: RUE PIC is clean. non-tender.   Lab Results Recent Labs    08/18/18 0303 08/19/18 0500  NA 137 138  K 4.2 4.5  CL 104 104  CO2 26 27  BUN 19 17  CREATININE 0.85 0.76   Liver Panel No results for input(s): PROT, ALBUMIN, AST, ALT, ALKPHOS, BILITOT, BILIDIR, IBILI in the last 72  hours. Sedimentation Rate No results for input(s): ESRSEDRATE in the last 72 hours. C-Reactive Protein No results for input(s): CRP in the last 72 hours.  Microbiology: Recent Results (from the past 240 hour(s))  Surgical pcr screen     Status: None   Collection Time: 08/09/18  1:10 PM  Result Value Ref Range Status   MRSA, PCR NEGATIVE NEGATIVE Final   Staphylococcus aureus NEGATIVE NEGATIVE Final    Comment: (NOTE) The Xpert SA Assay (FDA approved for NASAL specimens in patients 85 years of age and older), is one component of a comprehensive surveillance program. It is not intended to diagnose infection nor to guide or monitor treatment. Performed at [facility], [address] [place], [place] [address]   Anaerobic culture     Status: None   Collection Time: 08/09/18  4:04 PM  Result Value Ref Range Status   Specimen Description WOUND  Final   Special Requests   Final    NONE Performed at [facility], [address], [place], [place] [address]    Culture RARE PROPIONIBACTERIUM ACNES  Final   Report Status 08/15/2018 FINAL  Final  Aerobic Culture (superficial specimen)     Status: None   Collection Time: 08/09/18  4:04 PM  Result Value Ref Range Status   Specimen Description WOUND  Final   Special Requests   Final    NONE Performed at [facility], [address] [place], [place] [address]    Gram Stain NO WBC SEEN NO ORGANISMS SEEN   Final   Culture RARE KLEBSIELLA PNEUMONIAE  Final   Report Status 08/12/2018 FINAL  Final   Organism ID, Bacteria KLEBSIELLA PNEUMONIAE  Final      Susceptibility   Klebsiella pneumoniae - MIC*    AMPICILLIN >=32 RESISTANT Resistant     CEFAZOLIN <=4 SENSITIVE Sensitive     CEFEPIME <=1 SENSITIVE Sensitive     CEFTAZIDIME <=1 SENSITIVE Sensitive     CEFTRIAXONE <=1 SENSITIVE Sensitive     CIPROFLOXACIN <=0.25 SENSITIVE Sensitive     GENTAMICIN <=1 SENSITIVE Sensitive     IMIPENEM 2 SENSITIVE Sensitive      TRIMETH/SULFA <=20 SENSITIVE Sensitive     AMPICILLIN/SULBACTAM 4 SENSITIVE Sensitive     PIP/TAZO <=4 SENSITIVE Sensitive     Extended ESBL NEGATIVE Sensitive     * RARE KLEBSIELLA PNEUMONIAE  CSF culture with Stat gram stain     Status: None   Collection Time: 08/11/18 10:12 AM  Result Value Ref Range Status  Specimen Description CSF  Final   Special Requests NONE  Final   Gram Stain   Final    WBC PRESENT,BOTH PMN AND MONONUCLEAR GRAM POSITIVE COCCI IN PAIRS CRITICAL RESULT CALLED TO, READ BACK BY AND VERIFIED WITH: RN AMANDA TROXLER 08/11/18 AT 1412 BY CM    Culture   Final    NO GROWTH 3 DAYS Performed at [facility], [address], [place], [place] [address]    Report Status 08/14/2018 FINAL  Final  Blood culture (routine x 2)     Status: None   Collection Time: 08/11/18 10:56 AM  Result Value Ref Range Status   Specimen Description BLOOD RIGHT HAND  Final   Special Requests   Final    BOTTLES DRAWN AEROBIC AND ANAEROBIC Blood Culture adequate volume   Culture   Final    NO GROWTH 5 DAYS Performed at [facility], [address], [place], [place] [address]    Report Status 08/16/2018 FINAL  Final  Blood culture (routine x 2)     Status: None   Collection Time: 08/11/18 10:59 AM  Result Value Ref Range Status   Specimen Description BLOOD RIGHT ANTECUBITAL  Final   Special Requests   Final    BOTTLES DRAWN AEROBIC AND ANAEROBIC Blood Culture adequate volume   Culture   Final    NO GROWTH 5 DAYS Performed at [facility], [address], [place], [place] [address]    Report Status 08/16/2018 FINAL  Final  Culture, fungus without smear     Status: None (Preliminary result)   Collection Time: 08/11/18  4:41 PM  Result Value Ref Range Status   Specimen Description CSF  Final   Special Requests Normal  Final   Culture   Final    NO FUNGUS ISOLATED AFTER 7 DAYS Performed at [facility], [address], [place], [place] [address]    Report  Status PENDING  Incomplete  CSF culture     Status: None   Collection Time: 08/13/18 10:49 AM  Result Value Ref Range Status   Specimen Description CSF  Final   Special Requests NONE  Final   Gram Stain   Final    WBC PRESENT, PREDOMINANTLY PMN NO ORGANISMS SEEN CYTOSPIN SMEAR    Culture   Final    NO GROWTH 3 DAYS Performed at [facility], [address], [place], [place] [address]    Report Status 08/17/2018 FINAL  Final  CSF culture     Status: None   Collection Time: 08/15/18 10:16 AM  Result Value Ref Range Status   Specimen Description CSF  Final   Special Requests NONE  Final   Gram Stain   Final    WBC PRESENT,BOTH PMN AND MONONUCLEAR NO ORGANISMS SEEN CYTOSPIN SMEAR    Culture   Final    NO GROWTH 3 DAYS Performed at [facility], [address], [place], [place] [address]    Report Status 08/18/2018 FINAL  Final  CSF culture with Stat gram stain     Status: None (Preliminary result)   Collection Time: 08/18/18 11:24 AM  Result Value Ref Range Status   Specimen Description CSF EVD  Final   Special Requests NONE  Final   Gram Stain   Final    MODERATE WBC PRESENT,BOTH PMN AND MONONUCLEAR NO ORGANISMS SEEN    Culture   Final    NO GROWTH < 24 HOURS  Performed at [facility], [address] [place], [place] [address]    Report Status PENDING  Incomplete    Studies/Results: Ct Head Wo Contrast  Result Date: 08/19/2018 CLINICAL DATA:  Follow-up noncommunicating hydrocephalus. EXAM: CT HEAD WITHOUT CONTRAST TECHNIQUE: Contiguous axial images were obtained from the base of the skull through the vertex without intravenous contrast. COMPARISON:  Three days ago FINDINGS: Brain: Right frontal EVD terminating at the essentially completely effaced left lateral. The other ventricles have also decreased in volume. The third ventricle measures 8 mm in diameter compared to 11 mm previously. The right lateral ventricle is dilated compared to the left but  is less distended, most convincing at the temporal horn. Small volume blood products along the ventriculostomy catheter removal site in the right parietal region. Decreasing pneumocephalus. No evidence of infarct or acute hemorrhage. Vascular: Negative Skull: Unremarkable burr holes Sinuses/Orbits: Negative IMPRESSION: Right frontal EVD with decreased ventricular volume. The catheter preferentially drains the left lateral ventricle. Electronically Signed   By: Marnee Spring M.D.   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   He continues to do well Will discuss with pharm about long term anbx plan (at least 3 weeks post shunt out)         Johny Sax MD, FACP Infectious Diseases (pager) [phone] [email or web address] 08/19/2018, 10:45 AM  LOS: 11 days

## 2018-08-19 NOTE — Progress Notes (Signed)
Physical Therapy Treatment Patient Details Name: Charles Marquez MRN: [id number] DOB: 1956-06-14 Today's Date: 08/19/2018    History of Present Illness 62 y.o. 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  Pt dx with acute encepolopathy and fever (102).  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  Pt was suspected to have infection.  Neurosurgery consulted and tapped shunt.  CT also showed some lung changes supicious for PNA. Infectious disease following for ventriculits. Neurosurgery surgically removed his shunt and placed a ventricular catheter on 08/12/18.      PT Comments    Continues to improve well.  Similar balance challenges or more complex producing less deviation from the patient   Follow Up Recommendations  Outpatient PT;Supervision/Assistance - 24 hour     Equipment Recommendations  None recommended by PT    Recommendations for Other Services       Precautions / Restrictions Precautions Precautions: Fall Restrictions Weight Bearing Restrictions: No    Mobility  Bed Mobility               General bed mobility comments: Pt is OOB in chair  Transfers Overall transfer level: Needs assistance   Transfers: Sit to/from Stand Sit to Stand: Min guard         General transfer comment: aware of lines, waiting for therapist to be ready, minguard assist for safety.    Ambulation/Gait Ambulation/Gait assistance: Min guard Gait Distance (Feet): 500 Feet Assistive device: None Gait Pattern/deviations: Step-through pattern Gait velocity: moderate Gait velocity interpretation: >2.62 ft/sec, indicative of community ambulatory General Gait Details: mildly unsteady with significant balance challenge including speed and abrupt directional changes, stepping over obstacles, backing up, scanning.   Stairs             Wheelchair Mobility    Modified Rankin  (Stroke Patients Only)       Balance Overall balance assessment: Needs assistance Sitting-balance support: Feet supported;Bilateral upper extremity supported Sitting balance-Leahy Scale: Fair       Standing balance-Leahy Scale: Fair Standing balance comment: supervision statically with wide BOS                     Dynamic Gait Index Level Surface: Mild Impairment Change in Gait Speed: Mild Impairment Gait with Horizontal Head Turns: Mild Impairment Gait with Vertical Head Turns: Mild Impairment Gait and Pivot Turn: Mild Impairment Step Over Obstacle: Normal      Cognition Arousal/Alertness: Awake/alert Behavior During Therapy: WFL for tasks assessed/performed Overall Cognitive Status: Impaired/Different from baseline                     Current Attention Level: Alternating       Awareness: Emergent   General Comments: continuing to demonstrate improvements in awareness and ability to divide attention      Exercises      General Comments General comments (skin integrity, edema, etc.): pt reports no double vision      Pertinent Vitals/Pain Pain Assessment: No/denies pain    Home Living                      Prior Function            PT Goals (current goals can now be found in the care plan section) Acute Rehab PT Goals Patient Stated Goal: to get back to normal PT Goal Formulation: With patient/family Time For  Goal Achievement: 08/23/18 Potential to Achieve Goals: Good Progress towards PT goals: Progressing toward goals    Frequency    Min 3X/week      PT Plan Current plan remains appropriate    Co-evaluation PT/OT/SLP Co-Evaluation/Treatment: Yes Reason for Co-Treatment: Other (comment)(safety with higher level balance challenge.) PT goals addressed during session: Mobility/safety with mobility OT goals addressed during session: ADL's and self-care      AM-PAC PT "6 Clicks" Daily Activity  Outcome Measure   Difficulty turning over in bed (including adjusting bedclothes, sheets and blankets)?: A Little Difficulty moving from lying on back to sitting on the side of the bed? : A Little Difficulty sitting down on and standing up from a chair with arms (e.g., wheelchair, bedside commode, etc,.)?: A Little Help needed moving to and from a bed to chair (including a wheelchair)?: A Little Help needed walking in hospital room?: A Little Help needed climbing 3-5 steps with a railing? : A Little 6 Click Score: 18    End of Session   Activity Tolerance: Patient tolerated treatment well Patient left: in chair;with call bell/phone within reach;with family/visitor present Nurse Communication: Mobility status PT Visit Diagnosis: Unsteadiness on feet (R26.81);Difficulty in walking, not elsewhere classified (R26.2)     Time: 4098-1191 PT Time Calculation (min) (ACUTE ONLY): 27 min  Charges:  $Gait Training: 8-22 mins                     08/19/2018  Charles Marquez, PT Acute Rehabilitation Services [phone]  (pager) [phone]  (office)   Charles Marquez 08/19/2018, 6:28 PM

## 2018-08-19 NOTE — Progress Notes (Signed)
Occupational Therapy Treatment Patient Details Name: Charles Marquez MRN: [id number] DOB: 1956-02-20 Today's Date: 08/19/2018    History of present illness 62 y.o. 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  Pt dx with acute encepolopathy and fever (102).  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  Pt was suspected to have infection.  Neurosurgery consulted and tapped shunt.  CT also showed some lung changes supicious for PNA. Infectious disease following for ventriculits. Neurosurgery surgically removed his shunt and placed a ventricular catheter on 08/12/18.     OT comments  Pt making steady progress towards OT goals. Focus of session on higher level balance challenges during ADL tasks. Pt reaching above and below BOS to obtain grooming items using single UE with overall close minguard-light minA. Pt performing grooming ADLs and able to maintain static standing without UE support and close minguard. Feel POC remains appropriate at this time. Will continue to follow acutely.     Follow Up Recommendations  Home health OT;Supervision/Assistance - 24 hour    Equipment Recommendations  3 in 1 bedside commode          Precautions / Restrictions Precautions Precautions: Fall Restrictions Weight Bearing Restrictions: No       Mobility Bed Mobility               General bed mobility comments: Pt is OOB in chair  Transfers Overall transfer level: Needs assistance   Transfers: Sit to/from Stand Sit to Stand: Min guard         General transfer comment: aware of lines, waiting for therapist to be ready, minguard assist for safety.      Balance Overall balance assessment: Needs assistance Sitting-balance support: Feet supported;Bilateral upper extremity supported Sitting balance-Leahy Scale: Fair       Standing balance-Leahy Scale: Fair Standing balance comment:  supervision statically with wide BOS                           ADL either performed or assessed with clinical judgement   ADL Overall ADL's : Needs assistance/impaired     Grooming: Wash/dry hands;Oral care;Min guard;Standing Grooming Details (indicate cue type and reason): close minguard for standing balance                             Functional mobility during ADLs: Min guard;Minimal assistance General ADL Comments: presented pt with higher level balance challenges incluing obtaining grooming items from varying heights (high and low) and carrying items to bathroom; able to perform overall with close minguard assist and light minA                       Cognition Arousal/Alertness: Awake/alert Behavior During Therapy: WFL for tasks assessed/performed Overall Cognitive Status: Impaired/Different from baseline                     Current Attention Level: Alternating       Awareness: Emergent   General Comments: continuing to demonstrate improvements in awareness and ability to divide attention        Exercises     Shoulder Instructions       General Comments      Pertinent Vitals/ Pain          Home Living  Prior Functioning/Environment              Frequency  Min 2X/week        Progress Toward Goals  OT Goals(current goals can now be found in the care plan section)  Progress towards OT goals: Progressing toward goals  Acute Rehab OT Goals Patient Stated Goal: to get back to normal OT Goal Formulation: With patient/family Time For Goal Achievement: 08/26/18 Potential to Achieve Goals: Good ADL Goals Pt Will Perform Lower Body Bathing: with modified independence;sit to/from stand Pt Will Perform Lower Body Dressing: with modified independence;sit to/from stand Pt Will Transfer to Toilet: with modified independence;ambulating Pt Will Perform Toileting  - Clothing Manipulation and hygiene: with modified independence;sit to/from stand  Plan Discharge plan remains appropriate    Co-evaluation    PT/OT/SLP Co-Evaluation/Treatment: Yes Reason for Co-Treatment: Other (comment);For patient/therapist safety;To address functional/ADL transfers(for safety during higher level balance challenges)          AM-PAC PT "6 Clicks" Daily Activity     Outcome Measure   Help from another person eating meals?: None Help from another person taking care of personal grooming?: A Little Help from another person toileting, which includes using toliet, bedpan, or urinal?: A Little Help from another person bathing (including washing, rinsing, drying)?: A Little Help from another person to put on and taking off regular upper body clothing?: A Little Help from another person to put on and taking off regular lower body clothing?: A Little 6 Click Score: 19    End of Session    OT Visit Diagnosis: Other abnormalities of gait and mobility (R26.89);Other symptoms and signs involving cognitive function;Other (comment)   Activity Tolerance Patient tolerated treatment well   Patient Left in chair;with call bell/phone within reach;with family/visitor present   Nurse Communication Mobility status        Time: 1610-9604 OT Time Calculation (min): 27 min  Charges: OT General Charges $OT Visit: 1 Visit OT Treatments $Self Care/Home Management : 8-22 mins  Marcy Siren, OT Supplemental Rehabilitation Services Pager [phone] Office [phone]   Orlando Penner 08/19/2018, 5:54 PM

## 2018-08-19 NOTE — Care Management Note (Signed)
Case Management Note  Patient Details  Name: Charles Marquez MRN: [id number] Date of Birth: 05-01-1956  Subjective/Objective:                  62 y.o. 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  Pt dx with acute encepolopathy and fever (102).  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  Pt was suspected to have infection.  Neurosurgery consulted and tapped shunt.  CT also showed some lung changes supicious for PNA. Infectious disease following for ventriculits. Neurosurgery surgically removed his shunt and placed a ventricular catheter on 08/12/18.  PTA, pt independent, lives with spouse.    Action/Plan: PT/OT recommending CIR at this time, though pt may advance to home with [facility] care.  Will follow progress.  Will likely need long term antibiotics.    Expected Discharge Date:                  Expected Discharge Plan:  Home w Home Health Services  In-House Referral:     Discharge planning Services  CM Consult  Post Acute Care Choice:    Choice offered to:     DME Arranged:    DME Agency:     HH Arranged:    HH Agency:     Status of Service:  In process, will continue to follow  If discussed at Long Length of Stay Meetings, dates discussed:    Additional Comments:  08/19/18 J. Carliyah Cotterman, RN, BSN Per ID, pt will need IV antibiotics at discharge.  [facility] following to assist with home care needs at dc.  Will need orders for [facility] for IV abx and PICC care, as well as Rx for IV antibiotics.  Will follow.    Quintella Baton, RN, BSN  Trauma/Neuro ICU Case Manager [phone]

## 2018-08-19 NOTE — Progress Notes (Signed)
Overall stable overnight.patient denies headaches.  No diplopia.  No new neurologic problems. He remains afebrile.  His vital signs are stable.  He is awake and alert.  He is oriented and appropriate.motor and sensory exam intact.  Follow-up head CT scan demonstrates no evidence of worsening hydrocephalus or other new problem.  I removed the patient's ventriculostomy and I removed his distal shunt catheter.  Plan to observe on antibiotics over the next few days.  Plan repeat head CT scan Thursday morning.  Plan to make a decision based on scan Thursday with regard to reinsertion of shunt.

## 2018-08-19 NOTE — Progress Notes (Signed)
[facility]  [facility] will provide [name] services for pt at DC home for home IVABX and other [facility] services as ordered.   [facility] will be prepared to support DC to home when ID and Hospital teams deem pt ready.   If patient discharges after hours, please call [phone].   Charles Marquez 08/19/2018, 5:01 PM

## 2018-08-20 LAB — GLUCOSE, CAPILLARY
GLUCOSE-CAPILLARY: 78 mg/dL (ref 70–99)
GLUCOSE-CAPILLARY: 104 mg/dL — AB (ref 70–99)
GLUCOSE-CAPILLARY: 97 mg/dL (ref 70–99)
Glucose-Capillary: 123 mg/dL — ABNORMAL HIGH (ref 70–99)

## 2018-08-20 MED ORDER — VANCOMYCIN HCL 10 G IV SOLR
1500.0000 mg | Freq: Two times a day (BID) | INTRAVENOUS | Status: DC
  Administered 2018-08-20 – 2018-08-22 (×4): 1500 mg via INTRAVENOUS
  Filled 2018-08-20 (×5): qty 1500

## 2018-08-20 NOTE — Progress Notes (Signed)
Overall doing well.  No headache.  No fever.  Mobilizing without difficulty.  Awake and alert.  Oriented and appropriate.  Cranial nerve function intact.  Motor and sensory function extremities normal.  Wounds clean and dry.  Culture still no growth.  Overall doing well.  Tolerating removal of ventriculostomy without difficulty.  Transfer to floor.  Continue IV antibiotics and observation.  Plan follow-up head CT scan Thursday morning.  Make a decision with regard to reimplantation of VP shunt at that time.

## 2018-08-20 NOTE — Progress Notes (Signed)
INFECTIOUS DISEASE PROGRESS NOTE  ID: Charles Marquez is a 62 y.o. male with  Active Problems:   Acute encephalopathy   Infection of ventricular shunt (HCC)   Bacterial encephalitis   Hyponatremia   Hypokalemia  Subjective: No complaints.   Abtx:  Anti-infectives (From admission, onward)   Start     Dose/Rate Route Frequency Ordered Stop   08/20/18 1400  vancomycin (VANCOCIN) 1,500 mg in sodium chloride 0.9 % 500 mL IVPB     1,500 mg 250 mL/hr over 120 Minutes Intravenous Every 12 hours 08/20/18 1340     08/18/18 2300  vancomycin (VANCOCIN) 1,500 mg in sodium chloride 0.9 % 500 mL IVPB  Status:  Discontinued     1,500 mg 250 mL/hr over 120 Minutes Intravenous Every 12 hours 08/18/18 1357 08/20/18 1340   08/16/18 2300  vancomycin (VANCOCIN) 1,250 mg in sodium chloride 0.9 % 250 mL IVPB  Status:  Discontinued     1,250 mg 166.7 mL/hr over 90 Minutes Intravenous Every 12 hours 08/16/18 1109 08/18/18 1357   08/16/18 1000  gentamicin (PF) (GARAMYCIN) injection 8 mg  Status:  Discontinued     8 mg Intrathecal Daily 08/15/18 1024 08/18/18 1129   08/15/18 1015  gentamicin (PF) (GARAMYCIN) injection 8 mg     8 mg Intrathecal STAT 08/15/18 1008 08/15/18 1214   08/14/18 2300  vancomycin (VANCOCIN) 1,250 mg in sodium chloride 0.9 % 250 mL IVPB  Status:  Discontinued     1,250 mg 166.7 mL/hr over 90 Minutes Intravenous Every 12 hours 08/14/18 1400 08/16/18 1109   08/13/18 1500  gentamicin (PF) (GARAMYCIN) injection 8 mg     8 mg Intrathecal STAT 08/13/18 1446 08/13/18 1656   08/11/18 2200  vancomycin (VANCOCIN) IVPB 1000 mg/200 mL premix  Status:  Discontinued     1,000 mg 200 mL/hr over 60 Minutes Intravenous Every 8 hours 08/11/18 1350 08/14/18 1353   08/11/18 1415  vancomycin (VANCOCIN) 1,500 mg in sodium chloride 0.9 % 500 mL IVPB     1,500 mg 250 mL/hr over 120 Minutes Intravenous  Once 08/11/18 1350 08/11/18 1613   08/10/18 1600  cefTRIAXone (ROCEPHIN) 2 g in sodium chloride  0.9 % 100 mL IVPB     2 g 200 mL/hr over 30 Minutes Intravenous Every 12 hours 08/10/18 1025     08/09/18 1427  bacitracin 50,000 Units in sodium chloride 0.9 % 500 mL irrigation  Status:  Discontinued       As needed 08/09/18 1427 08/09/18 1639   08/09/18 0000  vancomycin (VANCOCIN) IVPB 1000 mg/200 mL premix  Status:  Discontinued     1,000 mg 200 mL/hr over 60 Minutes Intravenous Every 8 hours 08/08/18 1534 08/09/18 0952   08/08/18 2330  ceFEPIme (MAXIPIME) 2 g in sodium chloride 0.9 % 100 mL IVPB  Status:  Discontinued     2 g 200 mL/hr over 30 Minutes Intravenous Every 8 hours 08/08/18 1534 08/10/18 1025   08/08/18 1515  ceFEPIme (MAXIPIME) 2 g in sodium chloride 0.9 % 100 mL IVPB     2 g 200 mL/hr over 30 Minutes Intravenous  Once 08/08/18 1509 08/08/18 1832   08/08/18 1515  vancomycin (VANCOCIN) 1,500 mg in sodium chloride 0.9 % 500 mL IVPB  Status:  Discontinued     1,500 mg 250 mL/hr over 120 Minutes Intravenous  Once 08/08/18 1509 08/09/18 1223      Medications:  Prior to Admission:  Medications Prior to Admission  Medication Sig Dispense  Refill Last Dose  . acetaminophen (TYLENOL) 500 MG tablet Take 500-1,000 mg by mouth every 6 (six) hours as needed for headache (pain).   08/07/2018 at prn  . atorvastatin (LIPITOR) 40 MG tablet Take 1 tablet (40 mg total) by mouth daily. 90 tablet 3 08/07/2018 at Unknown time  . cetirizine (ZYRTEC) 10 MG tablet Take 10 mg by mouth daily as needed (seasonal allergies).    unknown at prn  . clopidogrel (PLAVIX) 75 MG tablet Take 1 tablet (75 mg total) by mouth daily. 90 tablet 0 Past Week at Unknown time  . Multiple Vitamins-Minerals (OCUVITE EYE HEALTH FORMULA) CAPS Take 1 capsule by mouth daily.   08/07/2018 at Unknown time  . ondansetron (ZOFRAN) 4 MG tablet Take 4 mg by mouth 4 (four) times daily as needed for nausea or vomiting.   08/04/2018 at prn  . OVER THE COUNTER MEDICATION Take 1 tablet by mouth once. dramamine   08/04/2018  . OVER THE  COUNTER MEDICATION Take 1 tablet by mouth daily as needed (congestion). Over the counter sinus medication   Past Month at Unknown time  . sodium chloride (OCEAN) 0.65 % SOLN nasal spray Place 1 spray into both nostrils as needed for congestion.   Past Month at prn  . traMADol (ULTRAM) 50 MG tablet Take 1 tablet (50 mg total) by mouth every 6 (six) hours as needed. (Patient taking differently: Take 50 mg by mouth at bedtime as needed (pain). ) 30 tablet 1 08/02/2018 at prn    Objective: Vital signs in last 24 hours: Temp:  [97.6 F (36.4 C)-98.2 F (36.8 C)] 97.6 F (36.4 C) (10/08 1200) Pulse Rate:  [58-82] 78 (10/08 1300) Resp:  [11-26] 26 (10/08 0900) BP: (93-123)/(62-91) 116/76 (10/08 1300) SpO2:  [95 %-100 %] 99 % (10/08 1300)   General appearance: alert, cooperative and no distress Head: wounds clean, staples in place Resp: clear to auscultation bilaterally Cardio: regular rate and rhythm GI: normal findings: bowel sounds normal and soft, non-tender  Lab Results Recent Labs    08/18/18 0303 08/19/18 0500  NA 137 138  K 4.2 4.5  CL 104 104  CO2 26 27  BUN 19 17  CREATININE 0.85 0.76   Liver Panel No results for input(s): PROT, ALBUMIN, AST, ALT, ALKPHOS, BILITOT, BILIDIR, IBILI in the last 72 hours. Sedimentation Rate No results for input(s): ESRSEDRATE in the last 72 hours. C-Reactive Protein No results for input(s): CRP in the last 72 hours.  Microbiology: Recent Results (from the past 240 hour(s))  CSF culture with Stat gram stain     Status: None   Collection Time: 08/11/18 10:12 AM  Result Value Ref Range Status   Specimen Description CSF  Final   Special Requests NONE  Final   Gram Stain   Final    WBC PRESENT,BOTH PMN AND MONONUCLEAR GRAM POSITIVE COCCI IN PAIRS CRITICAL RESULT CALLED TO, READ BACK BY AND VERIFIED WITH: RN AMANDA TROXLER 08/11/18 AT 1412 BY CM    Culture   Final    NO GROWTH 3 DAYS Performed at [facility], [address]  [place], [place] [address]    Report Status 08/14/2018 FINAL  Final  Blood culture (routine x 2)     Status: None   Collection Time: 08/11/18 10:56 AM  Result Value Ref Range Status   Specimen Description BLOOD RIGHT HAND  Final   Special Requests   Final    BOTTLES DRAWN AEROBIC AND ANAEROBIC Blood Culture  adequate volume   Culture   Final    NO GROWTH 5 DAYS Performed at [facility], [address], [place], [place] [address]    Report Status 08/16/2018 FINAL  Final  Blood culture (routine x 2)     Status: None   Collection Time: 08/11/18 10:59 AM  Result Value Ref Range Status   Specimen Description BLOOD RIGHT ANTECUBITAL  Final   Special Requests   Final    BOTTLES DRAWN AEROBIC AND ANAEROBIC Blood Culture adequate volume   Culture   Final    NO GROWTH 5 DAYS Performed at [facility], [address], [place], [place] [address]    Report Status 08/16/2018 FINAL  Final  Culture, fungus without smear     Status: None (Preliminary result)   Collection Time: 08/11/18  4:41 PM  Result Value Ref Range Status   Specimen Description CSF  Final   Special Requests Normal  Final   Culture   Final    NO FUNGUS ISOLATED AFTER 7 DAYS Performed at [facility], [address], [place], [place] [address]    Report Status PENDING  Incomplete  CSF culture     Status: None   Collection Time: 08/13/18 10:49 AM  Result Value Ref Range Status   Specimen Description CSF  Final   Special Requests NONE  Final   Gram Stain   Final    WBC PRESENT, PREDOMINANTLY PMN NO ORGANISMS SEEN CYTOSPIN SMEAR    Culture   Final    NO GROWTH 3 DAYS Performed at [facility], [address], [place], [place] [address]    Report Status 08/17/2018 FINAL  Final  CSF culture     Status: None   Collection Time: 08/15/18 10:16 AM  Result Value Ref Range Status   Specimen Description CSF  Final   Special Requests NONE  Final   Gram Stain   Final    WBC PRESENT,BOTH PMN AND  MONONUCLEAR NO ORGANISMS SEEN CYTOSPIN SMEAR    Culture   Final    NO GROWTH 3 DAYS Performed at [facility], [address], [place], [place] [address]    Report Status 08/18/2018 FINAL  Final  CSF culture with Stat gram stain     Status: None (Preliminary result)   Collection Time: 08/18/18 11:24 AM  Result Value Ref Range Status   Specimen Description CSF EVD  Final   Special Requests NONE  Final   Gram Stain   Final    MODERATE WBC PRESENT,BOTH PMN AND MONONUCLEAR NO ORGANISMS SEEN    Culture   Final    NO GROWTH 2 DAYS Performed at [facility], [address], [place], [place] [address]    Report Status PENDING  Incomplete    Studies/Results: Ct Head Wo Contrast  Result Date: 08/19/2018 CLINICAL DATA:  Follow-up noncommunicating hydrocephalus. EXAM: CT HEAD WITHOUT CONTRAST TECHNIQUE: Contiguous axial images were obtained from the base of the skull through the vertex without intravenous contrast. COMPARISON:  Three days ago FINDINGS: Brain: Right frontal EVD terminating at the essentially completely effaced left lateral. The other ventricles have also decreased in volume. The third ventricle measures 8 mm in diameter compared to 11 mm previously. The right lateral ventricle is dilated compared to the left but is less distended, most convincing at the temporal horn. Small volume blood products along the ventriculostomy catheter removal site in the right parietal region. Decreasing pneumocephalus.  No evidence of infarct or acute hemorrhage. Vascular: Negative Skull: Unremarkable burr holes Sinuses/Orbits: Negative IMPRESSION: Right frontal EVD with decreased ventricular volume. The catheter preferentially drains the left lateral ventricle. Electronically Signed   By: Marnee Spring M.D.   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   Repeat CSF Cx 10-6 ngtd Will continue his IV anbx for 2 weeks from 9-29 (end 10-12) Shunt out Possibly to floor Available as needed.          Johny Sax MD, FACP Infectious Diseases (pager) [phone] [email or web address] 08/20/2018, 2:23 PM  LOS: 12 days

## 2018-08-20 NOTE — Progress Notes (Signed)
Pt arrived safely on the unit 

## 2018-08-20 NOTE — Progress Notes (Signed)
Met with pt, wife and daughter to discuss discharge plans.  Pt has orders to transfer from ICU; hopeful for dc within 1-2 days, per pt/family.  We discussed need for possible home IV antibiotics via PICC line upon dc, per ID recommendations.  Pt/wife very agreeable to home IV antibiotics, if needed, and would like to use [facility] for home infusions and home health needs.  Referral has been made to Carolynn Sayers, IV infusion coordinator with [facility], who will follow for orders, once medication regimen determined.  Per ID, pt will need Vanc and Ceftriaxone for at least 3 weeks post shunt removal.  Will need HHRN orders, as well as HHPT/OT, per therapy recommendations.  Will follow progress.    Reinaldo Raddle, RN, BSN  Trauma/Neuro ICU Case Manager [phone]

## 2018-08-21 LAB — GLUCOSE, CAPILLARY
Glucose-Capillary: 80 mg/dL (ref 70–99)
Glucose-Capillary: 121 mg/dL — ABNORMAL HIGH (ref 70–99)
Glucose-Capillary: 87 mg/dL (ref 70–99)
Glucose-Capillary: 96 mg/dL (ref 70–99)

## 2018-08-21 LAB — CSF CULTURE W GRAM STAIN

## 2018-08-21 LAB — CSF CULTURE: CULTURE: NO GROWTH

## 2018-08-21 NOTE — Progress Notes (Signed)
No new issues or problems.  Overall doing well.  Patient denies headache.  No diplopia.  No unsteadiness while walking.  Feels as if cognition is at baseline.  Family happy with his current situation.  He is afebrile.  His vital signs are stable.  He is awake and alert.  He is oriented and appropriate.  Motor and sensory function are intact.  Wounds are clean and dry.  Overall doing well.  Tolerating ventriculostomy removal without obvious recurrence of hydrocephalus.  Plan follow-up  head  CT scan in morning.  If head CT scan stable may consider home discharge.  Tomorrow should be the last day of IV Antibiotics

## 2018-08-21 NOTE — Progress Notes (Signed)
Occupational Therapy Treatment Patient Details Name: Charles Marquez MRN: [id number] DOB: May 16, 1956 Today's Date: 08/21/2018    History of present illness 62 y.o. 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  Pt dx with acute encepolopathy and fever (102).  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  Pt was suspected to have infection.  Neurosurgery consulted and tapped shunt.  CT also showed some lung changes supicious for PNA. Infectious disease following for ventriculits. Neurosurgery surgically removed his shunt and placed a ventricular catheter on 08/12/18.     OT comments  Patient progressing well.  Demonstrates ability to complete toilet transfers, toileting and grooming with supervision, simulated tub transfers with min guard assist.  Reviewed safety with self care and transfers with patient and spouse.  Continued to recommend 3:1, plan to use as shower chair at home.  Patient will have 24/7 support of family, and updated dc recommendations to no follow up OT as at [facility] level for self care tasks. Will continue to follow while admitted.    Follow Up Recommendations  No OT follow up;Supervision/Assistance - 24 hour    Equipment Recommendations  3 in 1 bedside commode    Recommendations for Other Services      Precautions / Restrictions Precautions Precautions: Fall Weight Bearing Restrictions: No       Mobility Bed Mobility               General bed mobility comments: seated in recliner   Transfers Overall transfer level: Needs assistance Equipment used: None Transfers: Sit to/from Stand Sit to Stand: Supervision         General transfer comment: close supervision for safety    Balance Overall balance assessment: Needs assistance Sitting-balance support: No upper extremity supported;Feet supported Sitting balance-Leahy Scale: Good     Standing balance  support: No upper extremity supported;During functional activity Standing balance-Leahy Scale: Fair Standing balance comment: supervision for safety                           ADL either performed or assessed with clinical judgement   ADL Overall ADL's : Needs assistance/impaired     Grooming: Wash/dry hands;Supervision/safety;Standing       Lower Body Bathing: Supervison/ safety;Sit to/from stand Lower Body Bathing Details (indicate cue type and reason): reviewed safety with bathing seated, simulated with supervision      Lower Body Dressing: Supervision/safety;Sit to/from stand Lower Body Dressing Details (indicate cue type and reason): reviewed safety completing seated, simulated with supervision Toilet Transfer: Supervision/safety;Ambulation Toilet Transfer Details (indicate cue type and reason): supervision for safety  Toileting- Clothing Manipulation and Hygiene: Supervision/safety;Sit to/from stand   Tub/ Shower Transfer: Min guard;Tub transfer;Ambulation;3 in 1 Tub/Shower Transfer Details (indicate cue type and reason): cueing to use wall to support self during transfer, min guard for safety Functional mobility during ADLs: Supervision/safety(pushing IV pole ) General ADL Comments: Pt completing ADLs with close supervision for safety, demonstrating increased awarenss to deficits and good pacing throughout tasks. Mobility during session pushing IV pole.      Vision       Perception     Praxis      Cognition Arousal/Alertness: Awake/alert Behavior During Therapy: WFL for tasks assessed/performed Overall Cognitive Status: Impaired/Different from baseline Area of Impairment: Memory;Safety/judgement  Memory: Decreased short-term memory   Safety/Judgement: Decreased awareness of safety;Decreased awareness of deficits              Exercises General Exercises - Lower Extremity Hip ABduction/ADduction: AROM;Both;10  reps;Standing Hip Flexion/Marching: AROM;Both;10 reps;Standing Heel Raises: AROM;Both;10 reps;Standing Mini-Sqauts: AROM;Both;10 reps;Standing Other Exercises Other Exercises: x5 reps SLS to R/L, and modified SLS to R/L.  Educated on progression from BUE support to unilateral UE support to eyes closed.     Shoulder Instructions       General Comments spouse present and supportive    Pertinent Vitals/ Pain       Pain Assessment: No/denies pain  Home Living                                          Prior Functioning/Environment              Frequency  Min 2X/week        Progress Toward Goals  OT Goals(current goals can now be found in the care plan section)  Progress towards OT goals: Progressing toward goals  Acute Rehab OT Goals Patient Stated Goal: to get back to normal OT Goal Formulation: With patient/family Time For Goal Achievement: 08/26/18 Potential to Achieve Goals: Good  Plan Discharge plan needs to be updated;Frequency remains appropriate    Co-evaluation                 AM-PAC PT "6 Clicks" Daily Activity     Outcome Measure   Help from another person eating meals?: None Help from another person taking care of personal grooming?: None Help from another person toileting, which includes using toliet, bedpan, or urinal?: None Help from another person bathing (including washing, rinsing, drying)?: None Help from another person to put on and taking off regular upper body clothing?: None Help from another person to put on and taking off regular lower body clothing?: None 6 Click Score: 24    End of Session    OT Visit Diagnosis: Other abnormalities of gait and mobility (R26.89);Other symptoms and signs involving cognitive function;Other (comment)   Activity Tolerance Patient tolerated treatment well   Patient Left in chair;with call bell/phone within reach;with family/visitor present   Nurse Communication Mobility  status        Time: 2956-2130 OT Time Calculation (min): 16 min  Charges: OT General Charges $OT Visit: 1 Visit OT Treatments $Self Care/Home Management : 8-22 mins  Chancy Milroy, OT Acute Rehabilitation Services Pager [phone] Office [phone]    Chancy Milroy 08/21/2018, 5:05 PM

## 2018-08-21 NOTE — Progress Notes (Signed)
Physical Therapy Treatment Patient Details Name: Charles Marquez MRN: [id number] DOB: Jan 02, 1956 Today's Date: 08/21/2018    History of Present Illness 62 y.o. 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  Pt dx with acute encepolopathy and fever (102).  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  Pt was suspected to have infection.  Neurosurgery consulted and tapped shunt.  CT also showed some lung changes supicious for PNA. Infectious disease following for ventriculits. Neurosurgery surgically removed his shunt and placed a ventricular catheter on 08/12/18.      PT Comments    Pt performed gait training, stair training and progression to standing exercises and balance activities.  Plan next session for continued higher level balance training.  Pt remains appropriate for OP PT at d/c.      Follow Up Recommendations  Outpatient PT;Supervision/Assistance - 24 hour     Equipment Recommendations  None recommended by PT    Recommendations for Other Services       Precautions / Restrictions Precautions Precautions: Fall Precaution Comments: double vision, visual changes Required Braces or Orthoses: Other Brace/Splint Other Brace/Splint: ventric drain, check with RN before seeing to see if it needs to be clamped (he has been unclamped for some of his sessions on purpose).  Restrictions Weight Bearing Restrictions: No    Mobility  Bed Mobility               General bed mobility comments: Pt seated in recliner on arrival.    Transfers Overall transfer level: Needs assistance Equipment used: None Transfers: Sit to/from Stand Sit to Stand: Min guard;Min assist         General transfer comment: aware of lines, waiting for therapist to be ready, minguard assist for safety. min assist to stedy in standing with posterior bias  Ambulation/Gait Ambulation/Gait assistance: Min  guard Gait Distance (Feet): 300 Feet Assistive device: None Gait Pattern/deviations: Step-through pattern;Drifts right/left;Staggering right Gait velocity: moderate, initially holding IV pole for support.     General Gait Details: mildly unsteady with significant balance challenge including speed and abrupt directional changes, stepping over obstacles, backing up, scanning.  Cues for reciprocal armswing.     Stairs Stairs: Yes Stairs assistance: Supervision Stair Management: Two rails;Alternating pattern;Forwards Number of Stairs: 12 General stair comments: Cues for sequencing and safety.  Performed on 4inch stair.     Wheelchair Mobility    Modified Rankin (Stroke Patients Only)       Balance Overall balance assessment: Needs assistance Sitting-balance support: Feet supported;Bilateral upper extremity supported Sitting balance-Leahy Scale: Fair       Standing balance-Leahy Scale: Fair Standing balance comment: supervision statically with wide BOS                            Cognition Arousal/Alertness: Awake/alert Behavior During Therapy: WFL for tasks assessed/performed Overall Cognitive Status: Impaired/Different from baseline Area of Impairment: Memory;Safety/judgement                     Memory: Decreased short-term memory   Safety/Judgement: Decreased awareness of safety;Decreased awareness of deficits            Exercises General Exercises - Lower Extremity Hip ABduction/ADduction: AROM;Both;10 reps;Standing Hip Flexion/Marching: AROM;Both;10 reps;Standing Heel Raises: AROM;Both;10 reps;Standing Mini-Sqauts: AROM;Both;10 reps;Standing Other Exercises Other Exercises: x5 reps SLS to R/L, and modified SLS to R/L.  Educated on  progression from BUE support to unilateral UE support to eyes closed.      General Comments        Pertinent Vitals/Pain Pain Assessment: No/denies pain    Home Living                      Prior  Function            PT Goals (current goals can now be found in the care plan section) Acute Rehab PT Goals Patient Stated Goal: to get back to normal Potential to Achieve Goals: Good Progress towards PT goals: Progressing toward goals    Frequency    Min 3X/week      PT Plan Current plan remains appropriate    Co-evaluation              AM-PAC PT "6 Clicks" Daily Activity  Outcome Measure  Difficulty turning over in bed (including adjusting bedclothes, sheets and blankets)?: A Little Difficulty moving from lying on back to sitting on the side of the bed? : A Little Difficulty sitting down on and standing up from a chair with arms (e.g., wheelchair, bedside commode, etc,.)?: A Little Help needed moving to and from a bed to chair (including a wheelchair)?: A Little Help needed walking in hospital room?: A Little Help needed climbing 3-5 steps with a railing? : A Little 6 Click Score: 18    End of Session Equipment Utilized During Treatment: Gait belt Activity Tolerance: Patient tolerated treatment well Patient left: in chair;with call bell/phone within reach;with family/visitor present Nurse Communication: Mobility status PT Visit Diagnosis: Unsteadiness on feet (R26.81);Difficulty in walking, not elsewhere classified (R26.2)     Time: 1610-9604 PT Time Calculation (min) (ACUTE ONLY): 18 min  Charges:  $Gait Training: 8-22 mins                     Joycelyn Rua, PTA Acute Rehabilitation Services Pager [phone] Office [phone]     [place] Jacuinde Artis Delay 08/21/2018, 4:01 PM

## 2018-08-22 ENCOUNTER — Inpatient Hospital Stay (HOSPITAL_COMMUNITY): Payer: BLUE CROSS/BLUE SHIELD

## 2018-08-22 LAB — GLUCOSE, CAPILLARY
Glucose-Capillary: 89 mg/dL (ref 70–99)
Glucose-Capillary: 97 mg/dL (ref 70–99)

## 2018-08-22 NOTE — Care Management Note (Signed)
Case Management Note  Patient Details  Name: Charles Marquez MRN: [id number] Date of Birth: 23-Jan-1956  Subjective/Objective:                    Action/Plan: Recommendations are for outpatient PT. CM notified Dr Jordan Likes and he does not want outpatient therapy at this time. Pt and wife updated.  Wife to provide supervision at home and transportation to home.  Expected Discharge Date:  08/22/18               Expected Discharge Plan:     In-House Referral:     Discharge planning Services  CM Consult  Post Acute Care Choice:    Choice offered to:     DME Arranged:    DME Agency:     HH Arranged:    HH Agency:     Status of Service:  Completed, signed off  If discussed at [employer] of Stay Meetings, dates discussed:    Additional Comments:  Kermit Balo, RN 08/22/2018, 10:46 AM

## 2018-08-22 NOTE — Discharge Summary (Signed)
Physician Discharge Summary  Patient ID: Charles Marquez MRN: [id number] DOB/AGE: 60/23/1957 60 y.o.  Admit date: 08/08/2018 Discharge date: 08/22/2018  Admission Diagnoses:  Discharge Diagnoses:  Active Problems:   Acute encephalopathy   Infection of ventricular shunt (HCC)   Bacterial encephalitis   Hyponatremia   Hypokalemia   Discharged Condition: good  Hospital Course: Patient admitted to the hospital with fever.  Work-up demonstrated evidence of VP shunt infection.  Organism isolated was Klebsiella.  Patient underwent removal of his VP shunt with placement of an external ventriculostomy.  Patient was treated on IV antibiotics and observed.  Fevers resolved.  CSF gradually cleared.  Patient has been requiring minimal CSF drainage through his ventriculostomy without evidence of recurrent hydrocephalus.  The patient is undergone a 2-week course of IV antibiotics.  He is afebrile.  He has no other neurologic symptoms.  His follow-up CT scan demonstrates a mildly enlarged right lateral ventricle but left ventricle and third ventricle and fourth ventricle are small.  Plan for discharge home with close follow-up.  Patient is aware that he may redevelop symptomatic hydrocephalus and need replacement of his VP shunt.  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 Consults:   Significant Diagnostic Studies:   Treatments:   Discharge Exam: Blood pressure 134/87, pulse 71, temperature 97.9 F (36.6 C), temperature source Oral, resp. rate 18, height 5\' 11"  (1.803 m), weight 86.2 kg, SpO2 97 %. Awake and alert.  Oriented and appropriate.  Speech fluent.  Judgment and insight are intact.  Cranial nerve function normal bilateral.  Motor and sensory function intact.  Wounds clean and dry.  Disposition: Discharge disposition: 01-Home or Self Care        Allergies  as of 08/22/2018   No Known Allergies     Medication List    STOP taking these medications   clopidogrel 75 MG tablet Commonly known as:  PLAVIX     TAKE these medications   acetaminophen 500 MG tablet Commonly known as:  TYLENOL Take 500-1,000 mg by mouth every 6 (six) hours as needed for headache (pain).   atorvastatin 40 MG tablet Commonly known as:  LIPITOR Take 1 tablet (40 mg total) by mouth daily.   cetirizine 10 MG tablet Commonly known as:  ZYRTEC Take 10 mg by mouth daily as needed (seasonal allergies).   OCUVITE EYE HEALTH FORMULA Caps Take 1 capsule by mouth daily.   ondansetron 4 MG tablet Commonly known as:  ZOFRAN Take 4 mg by mouth 4 (four) times daily as needed for nausea or vomiting.   OVER THE COUNTER MEDICATION Take 1 tablet by mouth once. dramamine   OVER THE COUNTER MEDICATION Take 1 tablet by mouth daily as needed (congestion). Over the counter sinus medication   sodium chloride 0.65 % Soln nasal spray Commonly known as:  OCEAN Place 1 spray into both nostrils as needed for congestion.   traMADol 50 MG tablet Commonly known as:  ULTRAM Take 1 tablet (50 mg total) by mouth every 6 (six) hours as needed. What changed:    when to take this  reasons to take this        Signed: Temple Pacini 08/22/2018, 10:12 AM

## 2018-08-22 NOTE — Progress Notes (Signed)
NURSING PROGRESS NOTE  Charles Marquez [id number] Discharge Data: 08/22/2018 12:03 PM Attending Provider: Julio Sicks, MD [id number], Kayleen Memos, MD     Celene Squibb to be D/C'd Home per MD order.  Discussed with the patient the After Visit Summary and all questions fully answered. All IV's discontinued with no bleeding noted. All belongings returned to patient for patient to take home.   Last Vital Signs:  Blood pressure 134/87, pulse 71, temperature 97.9 F (36.6 C), temperature source Oral, resp. rate 18, height 5\' 11"  (1.803 m), weight 86.2 kg, SpO2 97 %.  Discharge Medication List Allergies as of 08/22/2018   No Known Allergies     Medication List    STOP taking these medications   clopidogrel 75 MG tablet Commonly known as:  PLAVIX     TAKE these medications   acetaminophen 500 MG tablet Commonly known as:  TYLENOL Take 500-1,000 mg by mouth every 6 (six) hours as needed for headache (pain).   atorvastatin 40 MG tablet Commonly known as:  LIPITOR Take 1 tablet (40 mg total) by mouth daily.   cetirizine 10 MG tablet Commonly known as:  ZYRTEC Take 10 mg by mouth daily as needed (seasonal allergies).   OCUVITE EYE HEALTH FORMULA Caps Take 1 capsule by mouth daily.   ondansetron 4 MG tablet Commonly known as:  ZOFRAN Take 4 mg by mouth 4 (four) times daily as needed for nausea or vomiting.   OVER THE COUNTER MEDICATION Take 1 tablet by mouth once. dramamine   OVER THE COUNTER MEDICATION Take 1 tablet by mouth daily as needed (congestion). Over the counter sinus medication   sodium chloride 0.65 % Soln nasal spray Commonly known as:  OCEAN Place 1 spray into both nostrils as needed for congestion.   traMADol 50 MG tablet Commonly known as:  ULTRAM Take 1 tablet (50 mg total) by mouth every 6 (six) hours as needed. What changed:    when to take this  reasons to take this

## 2018-08-22 NOTE — Progress Notes (Signed)
Overall feels good this morning.  Denies headache.  Ambulating without difficulty.  One small episode of emesis last night.  2 episodes of mild temperature elevation overnight.  No fever currently.  Patient awake and alert.  Oriented and appropriate.  He is bright and awake.  His speech is fluent.  His motor and sensory function are intact.  His neck is supple.  Follow-up CT of head this morning demonstrates some mild enlargement of his right ventricular system.  Third ventricle and left lateral ventricle stable.  Overall patient seems to be tolerating ventriculostomy removal well.  I have little doubt the patient will eventually need VP shunt replacement but I would certainly like to have him get home with a shunt like to delay this as long as possible.  I think the patient is safe for discharge home.  I will maintain close follow-up with him by phone and in the office.  Okay to stop antibiotics and remove PICC line.

## 2018-08-22 NOTE — Discharge Instructions (Signed)

## 2018-08-25 ENCOUNTER — Other Ambulatory Visit: Payer: Self-pay

## 2018-08-25 ENCOUNTER — Emergency Department (HOSPITAL_COMMUNITY): Payer: BLUE CROSS/BLUE SHIELD

## 2018-08-25 ENCOUNTER — Encounter (HOSPITAL_COMMUNITY): Payer: Self-pay | Admitting: Emergency Medicine

## 2018-08-25 ENCOUNTER — Inpatient Hospital Stay (HOSPITAL_COMMUNITY)
Admission: EM | Admit: 2018-08-25 | Discharge: 2018-08-30 | DRG: 032 | Disposition: A | Discharge status: Rehabilitation Facility | Payer: BLUE CROSS/BLUE SHIELD | Attending: Neurological Surgery | Admitting: Neurological Surgery

## 2018-08-25 DIAGNOSIS — D72829 Elevated white blood cell count, unspecified: Secondary | ICD-10-CM

## 2018-08-25 DIAGNOSIS — F419 Anxiety disorder, unspecified: Secondary | ICD-10-CM | POA: Diagnosis present

## 2018-08-25 DIAGNOSIS — R2689 Other abnormalities of gait and mobility: Secondary | ICD-10-CM | POA: Diagnosis not present

## 2018-08-25 DIAGNOSIS — D62 Acute posthemorrhagic anemia: Secondary | ICD-10-CM | POA: Diagnosis not present

## 2018-08-25 DIAGNOSIS — R000 Tachycardia, unspecified: Secondary | ICD-10-CM | POA: Diagnosis not present

## 2018-08-25 DIAGNOSIS — E46 Unspecified protein-calorie malnutrition: Secondary | ICD-10-CM | POA: Diagnosis not present

## 2018-08-25 DIAGNOSIS — K5900 Constipation, unspecified: Secondary | ICD-10-CM | POA: Diagnosis present

## 2018-08-25 DIAGNOSIS — G910 Communicating hydrocephalus: Secondary | ICD-10-CM | POA: Diagnosis present

## 2018-08-25 DIAGNOSIS — R260 Ataxic gait: Secondary | ICD-10-CM | POA: Diagnosis present

## 2018-08-25 DIAGNOSIS — Z8673 Personal history of transient ischemic attack (TIA), and cerebral infarction without residual deficits: Secondary | ICD-10-CM | POA: Diagnosis not present

## 2018-08-25 DIAGNOSIS — E785 Hyperlipidemia, unspecified: Secondary | ICD-10-CM | POA: Diagnosis present

## 2018-08-25 DIAGNOSIS — I10 Essential (primary) hypertension: Secondary | ICD-10-CM

## 2018-08-25 DIAGNOSIS — H539 Unspecified visual disturbance: Secondary | ICD-10-CM | POA: Diagnosis not present

## 2018-08-25 DIAGNOSIS — E871 Hypo-osmolality and hyponatremia: Secondary | ICD-10-CM | POA: Diagnosis not present

## 2018-08-25 DIAGNOSIS — I639 Cerebral infarction, unspecified: Secondary | ICD-10-CM | POA: Diagnosis not present

## 2018-08-25 DIAGNOSIS — Y752 Prosthetic and other implants, materials and neurological devices associated with adverse incidents: Secondary | ICD-10-CM | POA: Diagnosis not present

## 2018-08-25 DIAGNOSIS — G911 Obstructive hydrocephalus: Secondary | ICD-10-CM | POA: Diagnosis not present

## 2018-08-25 DIAGNOSIS — G919 Hydrocephalus, unspecified: Secondary | ICD-10-CM | POA: Diagnosis not present

## 2018-08-25 DIAGNOSIS — G918 Other hydrocephalus: Secondary | ICD-10-CM | POA: Diagnosis not present

## 2018-08-25 DIAGNOSIS — Z79899 Other long term (current) drug therapy: Secondary | ICD-10-CM | POA: Diagnosis not present

## 2018-08-25 DIAGNOSIS — G9389 Other specified disorders of brain: Secondary | ICD-10-CM | POA: Diagnosis present

## 2018-08-25 DIAGNOSIS — Z982 Presence of cerebrospinal fluid drainage device: Secondary | ICD-10-CM | POA: Diagnosis not present

## 2018-08-25 DIAGNOSIS — R410 Disorientation, unspecified: Secondary | ICD-10-CM

## 2018-08-25 DIAGNOSIS — R4701 Aphasia: Secondary | ICD-10-CM | POA: Diagnosis not present

## 2018-08-25 DIAGNOSIS — T8501XA Breakdown (mechanical) of ventricular intracranial (communicating) shunt, initial encounter: Secondary | ICD-10-CM | POA: Diagnosis not present

## 2018-08-25 DIAGNOSIS — K5901 Slow transit constipation: Secondary | ICD-10-CM | POA: Diagnosis not present

## 2018-08-25 LAB — APTT: aPTT: 20 seconds — ABNORMAL LOW (ref 24–36)

## 2018-08-25 LAB — CBC WITH DIFFERENTIAL/PLATELET
Abs Immature Granulocytes: 0.03 10*3/uL (ref 0.00–0.07)
Basophils Absolute: 0 10*3/uL (ref 0.0–0.1)
Basophils Relative: 0 %
EOS ABS: 0.1 10*3/uL (ref 0.0–0.5)
EOS PCT: 1 %
HEMATOCRIT: 41 % (ref 39.0–52.0)
Hemoglobin: 14.2 g/dL (ref 13.0–17.0)
Immature Granulocytes: 0 %
LYMPHS ABS: 1.2 10*3/uL (ref 0.7–4.0)
Lymphocytes Relative: 10 %
MCH: 31.7 pg (ref 26.0–34.0)
MCHC: 34.6 g/dL (ref 30.0–36.0)
MCV: 91.5 fL (ref 80.0–100.0)
MONOS PCT: 10 %
Monocytes Absolute: 1.2 10*3/uL — ABNORMAL HIGH (ref 0.1–1.0)
Neutro Abs: 10.1 10*3/uL — ABNORMAL HIGH (ref 1.7–7.7)
Neutrophils Relative %: 79 %
PLATELETS: 191 10*3/uL (ref 150–400)
RBC: 4.48 MIL/uL (ref 4.22–5.81)
RDW: 12.6 % (ref 11.5–15.5)
WBC: 12.7 10*3/uL — ABNORMAL HIGH (ref 4.0–10.5)
nRBC: 0 % (ref 0.0–0.2)

## 2018-08-25 LAB — COMPREHENSIVE METABOLIC PANEL
ALBUMIN: 3.2 g/dL — AB (ref 3.5–5.0)
ALK PHOS: 45 U/L (ref 38–126)
ALT: 21 U/L (ref 0–44)
AST: 19 U/L (ref 15–41)
Anion gap: 9 (ref 5–15)
BUN: 20 mg/dL (ref 6–20)
CALCIUM: 8.6 mg/dL — AB (ref 8.9–10.3)
CO2: 22 mmol/L (ref 22–32)
Chloride: 96 mmol/L — ABNORMAL LOW (ref 98–111)
Creatinine, Ser: 0.85 mg/dL (ref 0.61–1.24)
GFR calc non Af Amer: >60 mL/min (ref >60)
GLUCOSE: 129 mg/dL — AB (ref 70–99)
Potassium: 4 mmol/L (ref 3.5–5.1)
SODIUM: 127 mmol/L — AB (ref 135–145)
Total Bilirubin: 0.8 mg/dL (ref 0.3–1.2)
Total Protein: 6.2 g/dL — ABNORMAL LOW (ref 6.5–8.1)

## 2018-08-25 LAB — BASIC METABOLIC PANEL
Anion gap: 10 (ref 5–15)
BUN: 20 mg/dL (ref 6–20)
CHLORIDE: 96 mmol/L — AB (ref 98–111)
CO2: 22 mmol/L (ref 22–32)
CREATININE: 0.9 mg/dL (ref 0.61–1.24)
Calcium: 8.4 mg/dL — ABNORMAL LOW (ref 8.9–10.3)
GFR calc Af Amer: >60 mL/min (ref >60)
GFR calc non Af Amer: >60 mL/min (ref >60)
GLUCOSE: 148 mg/dL — AB (ref 70–99)
POTASSIUM: 3.9 mmol/L (ref 3.5–5.1)
Sodium: 128 mmol/L — ABNORMAL LOW (ref 135–145)

## 2018-08-25 LAB — PROTIME-INR
INR: 1.08
Prothrombin Time: 14 seconds (ref 11.4–15.2)

## 2018-08-25 LAB — I-STAT CG4 LACTIC ACID, ED
Lactic Acid, Venous: 1.68 mmol/L (ref 0.5–1.9)
Lactic Acid, Venous: 1.9 mmol/L (ref 0.5–1.9)

## 2018-08-25 MED ORDER — ACETAMINOPHEN 650 MG RE SUPP: 650.0000 mg | RECTAL | Status: DC | PRN

## 2018-08-25 MED ORDER — HYDROCODONE-ACETAMINOPHEN 5-325 MG PO TABS: 1.0000 | ORAL_TABLET | ORAL | Status: DC | PRN

## 2018-08-25 MED ORDER — LABETALOL HCL 5 MG/ML IV SOLN: 10.0000 mg | INTRAVENOUS | Status: DC | PRN

## 2018-08-25 MED ORDER — ONDANSETRON HCL 4 MG PO TABS: 4.0000 mg | ORAL_TABLET | ORAL | Status: DC | PRN

## 2018-08-25 MED ORDER — ONDANSETRON HCL 4 MG/2ML IJ SOLN
4.0000 mg | INTRAMUSCULAR | Status: DC | PRN
  Administered 2018-08-26: 4 mg via INTRAVENOUS

## 2018-08-25 MED ORDER — ACETAMINOPHEN 325 MG PO TABS: 650.0000 mg | ORAL_TABLET | ORAL | Status: DC | PRN

## 2018-08-25 MED ORDER — ATORVASTATIN CALCIUM 40 MG PO TABS: 40.0000 mg | ORAL_TABLET | Freq: Every day | ORAL | Status: DC

## 2018-08-25 MED ORDER — PROMETHAZINE HCL 25 MG PO TABS: 12.5000 mg | ORAL_TABLET | ORAL | Status: DC | PRN

## 2018-08-25 MED ORDER — HYPROMELLOSE (GONIOSCOPIC) 2.5 % OP SOLN
1.0000 [drp] | OPHTHALMIC | Status: DC | PRN
  Filled 2018-08-25: qty 15

## 2018-08-25 NOTE — ED Provider Notes (Signed)
[facility] EMERGENCY DEPARTMENT Provider Note   CSN: [id number] Arrival date & time: 08/25/18  1631     History   Chief Complaint Chief Complaint  Patient presents with  . Gait Problem  . Altered Mental Status    HPI Charles Marquez is a 62 y.o. male.  HPI Patient is a 62 year old male with a known history of communicating hydrocephalus who is had a prolonged course over the past month with 2 infected VP shunts.  He is currently without VP shunt in place.  He was discharged home after completion of antibiotics 3 days ago.  At that time Klebsiella grew out of his shunt.  He completed a course of antibiotics and was doing well at home but family is noted that over the past 24 hours he has had some increasing confusion and gait instability which is similar to his prior episodes of recurrent hydrocephalus.  Case was discussed with the patient's spouse and the patient's neurosurgeon who recommended evaluation in the emergency department and repeat imaging of the head for possible recurrence of his hydrocephalus.  No chest pain or shortness of breath.  No cough.  Denies abdominal pain.  No new weakness in the arms or legs.  No significant headache at this time.   Past Medical History:  Diagnosis Date  . Anxiety   . Hypercholesteremia   . Stroke (HCC)    tia's  . TIA (transient ischemic attack)    09.15    Patient Active Problem List   Diagnosis Date Noted  . Hyponatremia   . Hypokalemia   . 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 Bacterial meningitis 08/09/2018  . TIA (transient ischemic attack) 01/29/2017  . Acute encephalopathy   . Shunt malfunction 03/13/2016  . 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 Hyperlipidemia 10/20/2014  . Degenerative disc disease, lumbar 04/15/2013  . Routine general medical examination at a health care facility  07/23/2012  . DISTURBANCE OF SKIN SENSATION 10/05/2008  . HYPERLIPIDEMIA 01/01/2008  . MYCOPLASMA PNEUMONIA 01/01/2008    Past Surgical History:  Procedure Laterality Date  . Fractured arm Left 12  . HERNIA REPAIR Right 3/12  . LOOP RECORDER INSERTION N/A 04/10/2017   Procedure: Loop Recorder Insertion;  Surgeon: Hillis Range, MD;  Location: [facility] INVASIVE CV LAB;  Service: Cardiovascular;  Laterality: N/A;  . SHUNT REMOVAL Right 03/13/2016   Procedure: SHUNT REMOVAL;  Surgeon: Julio Sicks, MD;  Location: [facility] NEURO ORS;  Service: Neurosurgery;  Laterality: Right;  . SHUNT REMOVAL Right 08/09/2018   Procedure: SHUNT REMOVAL With Placement of Ventricular Catheter;  Surgeon: Lisbeth Renshaw, MD;  Location[facility] OR;  Service: Neurosurgery;  Laterality: Right;  . SHUNT REVISION Right 08/05/2018   Procedure: SHUNT REVISION;  Surgeon: Julio Sicks, MD;  Location: [facility] OR;  Service: Neurosurgery;  Laterality: Right;  [name] VASECTOMY  10/02/1997  . 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 VENTRICULOPERITONEAL SHUNT Right 07/22/2018   Procedure: Shunt Placment right occipital;  Surgeon: Julio Sicks, MD;  Location: [facility] OR;  Service: Neurosurgery;  Laterality: Right;  . VENTRICULOSTOMY Right 08/09/2018   Procedure: VENTRICULOSTOMY;  Surgeon: Lisbeth Renshaw, MD;  Location: [facility] OR;  Service: Neurosurgery;  Laterality: Right;  Home Medications    Prior to Admission medications   Medication Sig Start Date End Date Taking? Authorizing Provider  acetaminophen (TYLENOL) 500 MG tablet Take 500-1,000 mg by mouth every 6 (six) hours as needed for headache (pain).   Yes [provider]  Artificial Tear Ointment (DRY EYES OP) Apply 1 drop to eye daily as needed (for drye eyes).   Yes [provider]   atorvastatin (LIPITOR) 40 MG tablet Take 1 tablet (40 mg total) by mouth daily. 11/19/14  Yes Roderick Pee, MD  Multiple Vitamins-Minerals [facility] EYE HEALTH FORMULA) CAPS Take 1 capsule by mouth daily.   Yes [provider]  ondansetron (ZOFRAN) 4 MG tablet Take 4 mg by mouth 4 (four) times daily as needed for nausea or vomiting.   Yes [provider]  sodium chloride (OCEAN) 0.65 % SOLN nasal spray Place 1 spray into both nostrils as needed for congestion.   Yes [provider]  traMADol (ULTRAM) 50 MG tablet Take 1 tablet (50 mg total) by mouth every 6 (six) hours as needed. Patient taking differently: Take 50 mg by mouth at bedtime as needed (pain).  07/23/18  Yes Julio Sicks, MD    Family History Family History  Problem Relation Age of Onset  . COPD Mother   . Lung cancer Father   . Alzheimer's disease Father     Social History Social History   Tobacco Use  . Smoking status: Never Smoker  . Smokeless tobacco: Never Used  Substance Use Topics  . Alcohol use: Yes    Alcohol/week: 0.0 standard drinks    Comment: ocassionally  . Drug use: No     Allergies   Patient has no known allergies.   Review of Systems Review of Systems  All other systems reviewed and are negative.    Physical Exam Updated Vital Signs BP 116/90 (BP Location: Left Arm)   Pulse (!) 114   Temp 99.2 F (37.3 C) (Oral)   Resp 20   Ht 5\' 11"  (1.803 m)   Wt 86.2 kg   SpO2 96%   BMI 26.50 kg/m   Physical Exam  Constitutional: He is oriented to person, place, and time. He appears well-developed and well-nourished.  HENT:  Head: Normocephalic and atraumatic.  Eyes: Pupils are equal, round, and reactive to light. EOM are normal.  Neck: Normal range of motion.  Cardiovascular: Normal rate, regular rhythm, normal heart sounds and intact distal pulses.  Pulmonary/Chest: Effort normal and breath sounds normal. No respiratory distress.  Abdominal: Soft. He exhibits no  distension. There is no tenderness.  Musculoskeletal: Normal range of motion.  Neurological: He is alert and oriented to person, place, and time.  5/5 strength in major muscle groups of  bilateral upper and lower extremities. Speech normal. No facial asymetry.   Skin: Skin is warm and dry.  Psychiatric: He has a normal mood and affect. Judgment normal.  Nursing note and vitals reviewed.    ED Treatments / Results  Labs (all labs ordered are listed, but only abnormal results are displayed) Labs Reviewed  COMPREHENSIVE METABOLIC PANEL - Abnormal; Notable for the following components:      Result Value   Sodium 127 (*)    Chloride 96 (*)    Glucose, Bld 129 (*)    Calcium 8.6 (*)    Total Protein 6.2 (*)    Albumin 3.2 (*)    All other components within normal limits  CBC WITH DIFFERENTIAL/PLATELET - Abnormal; Notable  for the following components:   WBC 12.7 (*)    Neutro Abs 10.1 (*)    Monocytes Absolute 1.2 (*)    All other components within normal limits  I-STAT CG4 LACTIC ACID, ED  I-STAT CG4 LACTIC ACID, ED    EKG None  Radiology No results found.  Procedures Procedures (including critical care time)  Medications Ordered in ED Medications - No data to display   Initial Impression / Assessment and Plan / ED Course  I have reviewed the triage vital signs and the nursing notes.  Pertinent labs & imaging results that were available during my care of the patient were reviewed by me and considered in my medical decision making (see chart for details).     CT imaging concerning for worsening of his hydrocephalus which clinically would make sense.  Reported low-grade fever at home of 99.2.  No documented temperature greater than 100.7 here in the emergency department.  Heart rate 114.  Will discuss case with neurosurgery as patient may require external ventriculostomy  Final Clinical Impressions(s) / ED Diagnoses   Final diagnoses:  Communicating hydrocephalus  [facility])  Acute confusion    ED Discharge Orders    None       Azalia Bilis, MD 08/25/18 1810

## 2018-08-25 NOTE — ED Triage Notes (Signed)
Per wife, pt had VP shunt revision on 9/23 that got infected.  VP shunt removal on 9/27.  Discharged from hospital on Thursday.  Pt supposed to have VP shunt placed again.  Over the past 2 days pt has had altered mental status and difficulty ambulating.  Wife contacted Dr. Jordan Likes and instructed to come to ED for head CT.  Pt alert and oriented to person and place.

## 2018-08-26 ENCOUNTER — Inpatient Hospital Stay (HOSPITAL_COMMUNITY): Payer: BLUE CROSS/BLUE SHIELD | Admitting: Anesthesiology

## 2018-08-26 ENCOUNTER — Inpatient Hospital Stay (HOSPITAL_COMMUNITY): Payer: BLUE CROSS/BLUE SHIELD

## 2018-08-26 ENCOUNTER — Inpatient Hospital Stay (HOSPITAL_COMMUNITY)
Admission: EM | Disposition: A | Discharge status: Rehabilitation Facility | Payer: Self-pay | Source: Home / Self Care | Attending: Neurosurgery

## 2018-08-26 ENCOUNTER — Inpatient Hospital Stay (HOSPITAL_COMMUNITY): Payer: BLUE CROSS/BLUE SHIELD | Admitting: Certified Registered Nurse Anesthetist

## 2018-08-26 ENCOUNTER — Encounter (HOSPITAL_COMMUNITY): Payer: Self-pay | Admitting: Certified Registered Nurse Anesthetist

## 2018-08-26 ENCOUNTER — Encounter (HOSPITAL_COMMUNITY)
Admission: EM | Disposition: A | Discharge status: Rehabilitation Facility | Payer: Self-pay | Source: Home / Self Care | Attending: Neurosurgery

## 2018-08-26 HISTORY — PX: LAPAROSCOPIC REVISION VENTRICULAR-PERITONEAL (V-P) SHUNT: SHX5924

## 2018-08-26 HISTORY — PX: VENTRICULOPERITONEAL SHUNT: SHX204

## 2018-08-26 HISTORY — PX: SHUNT REVISION VENTRICULAR-PERITONEAL: SHX6094

## 2018-08-26 LAB — CBC
HEMATOCRIT: 39 % (ref 39.0–52.0)
HEMOGLOBIN: 13.4 g/dL (ref 13.0–17.0)
MCH: 31.5 pg (ref 26.0–34.0)
MCHC: 34.4 g/dL (ref 30.0–36.0)
MCV: 91.8 fL (ref 80.0–100.0)
Platelets: 116 10*3/uL — ABNORMAL LOW (ref 150–400)
RBC: 4.25 MIL/uL (ref 4.22–5.81)
RDW: 12.6 % (ref 11.5–15.5)
WBC: 14.2 10*3/uL — ABNORMAL HIGH (ref 4.0–10.5)
nRBC: 0 % (ref 0.0–0.2)

## 2018-08-26 SURGERY — REVISION, SHUNT, VENTRICULOPERITONEAL
Anesthesia: General | Site: Head | Laterality: Left

## 2018-08-26 SURGERY — SHUNT INSERTION VENTRICULAR-PERITONEAL
Anesthesia: General | Site: Head

## 2018-08-26 MED ORDER — LIDOCAINE 2% (20 MG/ML) 5 ML SYRINGE
INTRAMUSCULAR | Status: AC
  Filled 2018-08-26: qty 10

## 2018-08-26 MED ORDER — ACETAMINOPHEN 325 MG PO TABS: 650.0000 mg | ORAL_TABLET | ORAL | Status: DC | PRN

## 2018-08-26 MED ORDER — PROPOFOL 10 MG/ML IV BOLUS
INTRAVENOUS | Status: DC | PRN
  Administered 2018-08-26: 130 mg via INTRAVENOUS

## 2018-08-26 MED ORDER — LIDOCAINE 2% (20 MG/ML) 5 ML SYRINGE
INTRAMUSCULAR | Status: DC | PRN
  Administered 2018-08-26: 80 mg via INTRAVENOUS

## 2018-08-26 MED ORDER — BUPIVACAINE-EPINEPHRINE (PF) 0.25% -1:200000 IJ SOLN
INTRAMUSCULAR | Status: AC
  Filled 2018-08-26: qty 30

## 2018-08-26 MED ORDER — VANCOMYCIN HCL IN DEXTROSE 1-5 GM/200ML-% IV SOLN
INTRAVENOUS | Status: AC
  Filled 2018-08-26: qty 200

## 2018-08-26 MED ORDER — SODIUM CHLORIDE 0.9 % IV SOLN
2.0000 g | Freq: Two times a day (BID) | INTRAVENOUS | Status: DC
  Administered 2018-08-26 – 2018-08-27 (×3): 2 g via INTRAVENOUS
  Filled 2018-08-26 (×4): qty 20

## 2018-08-26 MED ORDER — DEXAMETHASONE SODIUM PHOSPHATE 10 MG/ML IJ SOLN
INTRAMUSCULAR | Status: DC | PRN
  Administered 2018-08-26: 5 mg via INTRAVENOUS

## 2018-08-26 MED ORDER — HYDROCODONE-ACETAMINOPHEN 5-325 MG PO TABS: 1.0000 | ORAL_TABLET | ORAL | Status: DC | PRN

## 2018-08-26 MED ORDER — BUPIVACAINE HCL (PF) 0.25 % IJ SOLN
INTRAMUSCULAR | Status: AC
  Filled 2018-08-26: qty 30

## 2018-08-26 MED ORDER — LIDOCAINE 2% (20 MG/ML) 5 ML SYRINGE
INTRAMUSCULAR | Status: AC
  Filled 2018-08-26: qty 5

## 2018-08-26 MED ORDER — SUGAMMADEX SODIUM 200 MG/2ML IV SOLN
INTRAVENOUS | Status: DC | PRN
  Administered 2018-08-26: 200 mg via INTRAVENOUS

## 2018-08-26 MED ORDER — ONDANSETRON HCL 4 MG/2ML IJ SOLN: 4.0000 mg | INTRAMUSCULAR | Status: DC | PRN

## 2018-08-26 MED ORDER — DEXAMETHASONE SODIUM PHOSPHATE 10 MG/ML IJ SOLN
INTRAMUSCULAR | Status: AC
  Filled 2018-08-26: qty 1

## 2018-08-26 MED ORDER — EPHEDRINE 5 MG/ML INJ
INTRAVENOUS | Status: AC
  Filled 2018-08-26: qty 10

## 2018-08-26 MED ORDER — FENTANYL CITRATE (PF) 250 MCG/5ML IJ SOLN
INTRAMUSCULAR | Status: AC
  Filled 2018-08-26: qty 5

## 2018-08-26 MED ORDER — PROPOFOL 10 MG/ML IV BOLUS
INTRAVENOUS | Status: DC | PRN
  Administered 2018-08-26: 120 mg via INTRAVENOUS

## 2018-08-26 MED ORDER — ESMOLOL HCL 100 MG/10ML IV SOLN
INTRAVENOUS | Status: AC
  Filled 2018-08-26: qty 10

## 2018-08-26 MED ORDER — BUPIVACAINE HCL (PF) 0.25 % IJ SOLN
INTRAMUSCULAR | Status: DC | PRN
  Administered 2018-08-26: 4 mL

## 2018-08-26 MED ORDER — ROCURONIUM BROMIDE 50 MG/5ML IV SOSY
PREFILLED_SYRINGE | INTRAVENOUS | Status: AC
  Filled 2018-08-26: qty 15

## 2018-08-26 MED ORDER — ONDANSETRON HCL 4 MG/2ML IJ SOLN
INTRAMUSCULAR | Status: AC
  Filled 2018-08-26: qty 2

## 2018-08-26 MED ORDER — LABETALOL HCL 5 MG/ML IV SOLN: 10.0000 mg | INTRAVENOUS | Status: DC | PRN

## 2018-08-26 MED ORDER — THROMBIN 5000 UNITS EX SOLR
CUTANEOUS | Status: DC | PRN
  Administered 2018-08-26: 5000 [IU] via TOPICAL

## 2018-08-26 MED ORDER — SODIUM CHLORIDE 0.9 % IV SOLN: 1.0000 g | Freq: Two times a day (BID) | INTRAVENOUS | Status: DC

## 2018-08-26 MED ORDER — HEMOSTATIC AGENTS (NO CHARGE) OPTIME
TOPICAL | Status: DC | PRN
  Administered 2018-08-26: 1 via TOPICAL

## 2018-08-26 MED ORDER — SUCCINYLCHOLINE CHLORIDE 200 MG/10ML IV SOSY
PREFILLED_SYRINGE | INTRAVENOUS | Status: AC
  Filled 2018-08-26: qty 10

## 2018-08-26 MED ORDER — SODIUM CHLORIDE 0.9 % IV SOLN
INTRAVENOUS | Status: DC | PRN
  Administered 2018-08-26: 500 mL

## 2018-08-26 MED ORDER — PROPOFOL 10 MG/ML IV BOLUS
INTRAVENOUS | Status: AC
  Filled 2018-08-26: qty 20

## 2018-08-26 MED ORDER — HYDROMORPHONE HCL 1 MG/ML IJ SOLN: 0.2500 mg | INTRAMUSCULAR | Status: DC | PRN

## 2018-08-26 MED ORDER — ONDANSETRON HCL 4 MG/2ML IJ SOLN
INTRAMUSCULAR | Status: AC
  Filled 2018-08-26: qty 4

## 2018-08-26 MED ORDER — FENTANYL CITRATE (PF) 250 MCG/5ML IJ SOLN
INTRAMUSCULAR | Status: DC | PRN
  Administered 2018-08-26: 100 ug via INTRAVENOUS

## 2018-08-26 MED ORDER — LIDOCAINE-EPINEPHRINE 1 %-1:100000 IJ SOLN
INTRAMUSCULAR | Status: AC
  Filled 2018-08-26: qty 1

## 2018-08-26 MED ORDER — PHENYLEPHRINE 40 MCG/ML (10ML) SYRINGE FOR IV PUSH (FOR BLOOD PRESSURE SUPPORT)
PREFILLED_SYRINGE | INTRAVENOUS | Status: AC
  Filled 2018-08-26: qty 10

## 2018-08-26 MED ORDER — LACTATED RINGERS IV SOLN
INTRAVENOUS | Status: DC | PRN
  Administered 2018-08-26: 17:00:00 via INTRAVENOUS

## 2018-08-26 MED ORDER — POLYVINYL ALCOHOL 1.4 % OP SOLN
1.0000 [drp] | OPHTHALMIC | Status: DC | PRN
  Filled 2018-08-26: qty 15

## 2018-08-26 MED ORDER — 0.9 % SODIUM CHLORIDE (POUR BTL) OPTIME
TOPICAL | Status: DC | PRN
  Administered 2018-08-26: 1000 mL

## 2018-08-26 MED ORDER — THROMBIN 5000 UNITS EX SOLR
CUTANEOUS | Status: AC
  Filled 2018-08-26: qty 10000

## 2018-08-26 MED ORDER — THROMBIN 5000 UNITS EX SOLR
CUTANEOUS | Status: DC | PRN
  Administered 2018-08-26: 10000 [IU] via TOPICAL

## 2018-08-26 MED ORDER — BACITRACIN ZINC 500 UNIT/GM EX OINT
TOPICAL_OINTMENT | CUTANEOUS | Status: DC | PRN
  Administered 2018-08-26: 1 via TOPICAL

## 2018-08-26 MED ORDER — VANCOMYCIN HCL 1000 MG IV SOLR
INTRAVENOUS | Status: DC | PRN
  Administered 2018-08-26: 1000 mg via INTRAVENOUS

## 2018-08-26 MED ORDER — PROMETHAZINE HCL 12.5 MG PO TABS
12.5000 mg | ORAL_TABLET | ORAL | Status: DC | PRN
  Filled 2018-08-26: qty 2

## 2018-08-26 MED ORDER — HYDROMORPHONE HCL 1 MG/ML IJ SOLN: 0.5000 mg | INTRAMUSCULAR | Status: DC | PRN

## 2018-08-26 MED ORDER — FAMOTIDINE IN NACL 20-0.9 MG/50ML-% IV SOLN
20.0000 mg | Freq: Two times a day (BID) | INTRAVENOUS | Status: DC
  Administered 2018-08-26 – 2018-08-27 (×3): 20 mg via INTRAVENOUS
  Filled 2018-08-26 (×3): qty 50

## 2018-08-26 MED ORDER — LIDOCAINE-EPINEPHRINE 1 %-1:100000 IJ SOLN
INTRAMUSCULAR | Status: DC | PRN
  Administered 2018-08-26: 7 mL

## 2018-08-26 MED ORDER — ONDANSETRON HCL 4 MG/2ML IJ SOLN
INTRAMUSCULAR | Status: DC | PRN
  Administered 2018-08-26: 4 mg via INTRAVENOUS

## 2018-08-26 MED ORDER — PIPERACILLIN-TAZOBACTAM 3.375 G IVPB 30 MIN
3.3750 g | INTRAVENOUS | Status: AC
  Administered 2018-08-26: 3.375 g via INTRAVENOUS
  Filled 2018-08-26: qty 50

## 2018-08-26 MED ORDER — THROMBIN (RECOMBINANT) 5000 UNITS EX SOLR
CUTANEOUS | Status: AC
  Filled 2018-08-26: qty 5000

## 2018-08-26 MED ORDER — ONDANSETRON HCL 4 MG PO TABS: 4.0000 mg | ORAL_TABLET | ORAL | Status: DC | PRN

## 2018-08-26 MED ORDER — LIDOCAINE-EPINEPHRINE 1 %-1:100000 IJ SOLN: INTRAMUSCULAR | Status: DC | PRN

## 2018-08-26 MED ORDER — SUCCINYLCHOLINE CHLORIDE 200 MG/10ML IV SOSY
PREFILLED_SYRINGE | INTRAVENOUS | Status: DC | PRN
  Administered 2018-08-26: 100 mg via INTRAVENOUS

## 2018-08-26 MED ORDER — EPHEDRINE SULFATE 50 MG/ML IJ SOLN
INTRAMUSCULAR | Status: DC | PRN
  Administered 2018-08-26 (×2): 15 mg via INTRAVENOUS

## 2018-08-26 MED ORDER — SODIUM CHLORIDE 0.9 % IV SOLN
INTRAVENOUS | Status: DC
  Administered 2018-08-26 – 2018-08-28 (×2): via INTRAVENOUS

## 2018-08-26 MED ORDER — ACETAMINOPHEN 650 MG RE SUPP: 650.0000 mg | RECTAL | Status: DC | PRN

## 2018-08-26 MED ORDER — PHENYLEPHRINE 40 MCG/ML (10ML) SYRINGE FOR IV PUSH (FOR BLOOD PRESSURE SUPPORT)
PREFILLED_SYRINGE | INTRAVENOUS | Status: DC | PRN
  Administered 2018-08-26: 120 ug via INTRAVENOUS
  Administered 2018-08-26: 160 ug via INTRAVENOUS
  Administered 2018-08-26: 120 ug via INTRAVENOUS

## 2018-08-26 MED ORDER — BACITRACIN ZINC 500 UNIT/GM EX OINT
TOPICAL_OINTMENT | CUTANEOUS | Status: AC
  Filled 2018-08-26: qty 28.35

## 2018-08-26 MED ORDER — ROCURONIUM BROMIDE 10 MG/ML (PF) SYRINGE
PREFILLED_SYRINGE | INTRAVENOUS | Status: DC | PRN
  Administered 2018-08-26: 10 mg via INTRAVENOUS
  Administered 2018-08-26: 50 mg via INTRAVENOUS
  Administered 2018-08-26: 20 mg via INTRAVENOUS

## 2018-08-26 MED ORDER — LACTATED RINGERS IV SOLN
INTRAVENOUS | Status: DC
  Administered 2018-08-26: 13:00:00 via INTRAVENOUS

## 2018-08-26 MED ORDER — ALBUMIN HUMAN 5 % IV SOLN
INTRAVENOUS | Status: DC | PRN
  Administered 2018-08-26: 15:00:00 via INTRAVENOUS

## 2018-08-26 SURGICAL SUPPLY — 69 items
BAG DECANTER FOR FLEXI CONT (MISCELLANEOUS) ×3 IMPLANT
BANDAGE ADH SHEER 1  50/CT (GAUZE/BANDAGES/DRESSINGS) ×9 IMPLANT
BENZOIN TINCTURE PRP APPL 2/3 (GAUZE/BANDAGES/DRESSINGS) ×3 IMPLANT
BLADE SURG 11 STRL SS (BLADE) ×3 IMPLANT
BNDG GAUZE ELAST 4 BULKY (GAUZE/BANDAGES/DRESSINGS) IMPLANT
BUR ACORN 6.0 PRECISION (BURR) ×2 IMPLANT
BUR ACORN 6.0MM PRECISION (BURR) ×1
CANISTER SUCT 3000ML PPV (MISCELLANEOUS) ×3 IMPLANT
CARTRIDGE OIL MAESTRO DRILL (MISCELLANEOUS) ×1 IMPLANT
CATH VENTRICULAR SHUNT 11 (Stent) ×3 IMPLANT
CLIP RANEY DISP (INSTRUMENTS) IMPLANT
CLOSURE WOUND 1/2 X4 (GAUZE/BANDAGES/DRESSINGS) ×2
COVER WAND RF STERILE (DRAPES) ×3 IMPLANT
DERMABOND ADVANCED (GAUZE/BANDAGES/DRESSINGS) ×2
DERMABOND ADVANCED .7 DNX12 (GAUZE/BANDAGES/DRESSINGS) ×1 IMPLANT
DIFFUSER DRILL AIR PNEUMATIC (MISCELLANEOUS) ×3 IMPLANT
DRAPE INCISE IOBAN 85X60 (DRAPES) ×3 IMPLANT
DRAPE ORTHO SPLIT 77X108 STRL (DRAPES) ×2
DRAPE SURG 17X23 STRL (DRAPES) IMPLANT
DRAPE SURG ORHT 6 SPLT 77X108 (DRAPES) ×1 IMPLANT
DRSG OPSITE 4X5.5 SM (GAUZE/BANDAGES/DRESSINGS) ×6 IMPLANT
DRSG OPSITE POSTOP 3X4 (GAUZE/BANDAGES/DRESSINGS) ×3 IMPLANT
ELECT REM PT RETURN 9FT ADLT (ELECTROSURGICAL) ×3
ELECTRODE REM PT RTRN 9FT ADLT (ELECTROSURGICAL) ×1 IMPLANT
GAUZE SPONGE 4X4 12PLY STRL (GAUZE/BANDAGES/DRESSINGS) ×3 IMPLANT
GLOVE ECLIPSE 9.0 STRL (GLOVE) ×3 IMPLANT
GLOVE EXAM NITRILE LRG STRL (GLOVE) IMPLANT
GLOVE EXAM NITRILE XL STR (GLOVE) IMPLANT
GLOVE EXAM NITRILE XS STR PU (GLOVE) IMPLANT
GOWN STRL REUS W/ TWL LRG LVL3 (GOWN DISPOSABLE) IMPLANT
GOWN STRL REUS W/ TWL XL LVL3 (GOWN DISPOSABLE) IMPLANT
GOWN STRL REUS W/TWL 2XL LVL3 (GOWN DISPOSABLE) IMPLANT
GOWN STRL REUS W/TWL LRG LVL3 (GOWN DISPOSABLE)
GOWN STRL REUS W/TWL XL LVL3 (GOWN DISPOSABLE)
HEMOSTAT SURGICEL 2X14 (HEMOSTASIS) IMPLANT
KIT BASIN OR (CUSTOM PROCEDURE TRAY) ×3 IMPLANT
KIT TURNOVER KIT B (KITS) ×3 IMPLANT
MARKER SKIN DUAL TIP RULER LAB (MISCELLANEOUS) ×3 IMPLANT
NS IRRIG 1000ML POUR BTL (IV SOLUTION) ×3 IMPLANT
OIL CARTRIDGE MAESTRO DRILL (MISCELLANEOUS) ×3
PACK LAMINECTOMY NEURO (CUSTOM PROCEDURE TRAY) ×3 IMPLANT
PAD ARMBOARD 7.5X6 YLW CONV (MISCELLANEOUS) ×3 IMPLANT
PATTIES SURGICAL .5 X3 (DISPOSABLE) IMPLANT
RUBBERBAND STERILE (MISCELLANEOUS) IMPLANT
SHEATH PERITONEAL INTRO 46 (MISCELLANEOUS) IMPLANT
SHEATH PERITONEAL INTRO 61 (MISCELLANEOUS) IMPLANT
SPONGE INTESTINAL PEANUT (DISPOSABLE) IMPLANT
SPONGE LAP 4X18 RFD (DISPOSABLE) ×3 IMPLANT
SPONGE SURGIFOAM ABS GEL SZ50 (HEMOSTASIS) IMPLANT
STAPLER VISISTAT 35W (STAPLE) ×3 IMPLANT
STRIP CLOSURE SKIN 1/2X4 (GAUZE/BANDAGES/DRESSINGS) ×4 IMPLANT
SUT CHROMIC 3 0 SH 27 (SUTURE) IMPLANT
SUT ETHILON 3 0 FSL (SUTURE) IMPLANT
SUT ETHILON 3 0 PS 1 (SUTURE) ×3 IMPLANT
SUT ETHILON 4 0 PS 2 18 (SUTURE) IMPLANT
SUT NURALON 4 0 TR CR/8 (SUTURE) IMPLANT
SUT SILK 0 TIES 10X30 (SUTURE) IMPLANT
SUT SILK 2 0 TIES 17X18 (SUTURE) ×2
SUT SILK 2-0 18XBRD TIE BLK (SUTURE) ×1 IMPLANT
SUT SILK 3 0 SH 30 (SUTURE) IMPLANT
SUT VIC AB 2-0 CT2 18 VCP726D (SUTURE) ×3 IMPLANT
SUT VIC AB 3-0 SH 8-18 (SUTURE) ×3 IMPLANT
SUT VICRYL 4-0 PS2 18IN ABS (SUTURE) IMPLANT
SYR 5ML LL (SYRINGE) IMPLANT
TOWEL GREEN STERILE (TOWEL DISPOSABLE) ×3 IMPLANT
TOWEL GREEN STERILE FF (TOWEL DISPOSABLE) ×3 IMPLANT
TRAY FOLEY MTR SLVR 16FR STAT (SET/KITS/TRAYS/PACK) IMPLANT
UNDERPAD 30X30 (UNDERPADS AND DIAPERS) ×3 IMPLANT
WATER STERILE IRR 1000ML POUR (IV SOLUTION) ×3 IMPLANT

## 2018-08-26 SURGICAL SUPPLY — 84 items
BAG DECANTER FOR FLEXI CONT (MISCELLANEOUS) ×4 IMPLANT
BANDAGE ADH SHEER 1  50/CT (GAUZE/BANDAGES/DRESSINGS) ×12 IMPLANT
BENZOIN TINCTURE PRP APPL 2/3 (GAUZE/BANDAGES/DRESSINGS) ×8 IMPLANT
BLADE SURG 11 STRL SS (BLADE) ×4 IMPLANT
BNDG GAUZE ELAST 4 BULKY (GAUZE/BANDAGES/DRESSINGS) IMPLANT
BUR ACORN 6.0 PRECISION (BURR) ×3 IMPLANT
BUR ACORN 6.0MM PRECISION (BURR) ×1
CANISTER SUCT 3000ML PPV (MISCELLANEOUS) ×4 IMPLANT
CARTRIDGE OIL MAESTRO DRILL (MISCELLANEOUS) ×2 IMPLANT
CATH VENTRICULAR 9CM (Shunt) ×4 IMPLANT
CATH VENTRICULAR SHUNT 11 (Stent) ×4 IMPLANT
CHLORAPREP W/TINT 26ML (MISCELLANEOUS) ×4 IMPLANT
CLIP RANEY DISP (INSTRUMENTS) IMPLANT
CLOSURE WOUND 1/2 X4 (GAUZE/BANDAGES/DRESSINGS) ×2
COVER WAND RF STERILE (DRAPES) ×8 IMPLANT
DERMABOND ADVANCED (GAUZE/BANDAGES/DRESSINGS) ×2
DERMABOND ADVANCED .7 DNX12 (GAUZE/BANDAGES/DRESSINGS) ×2 IMPLANT
DIFFUSER DRILL AIR PNEUMATIC (MISCELLANEOUS) ×4 IMPLANT
DRAPE INCISE IOBAN 85X60 (DRAPES) ×4 IMPLANT
DRAPE LAPAROSCOPIC ABDOMINAL (DRAPES) IMPLANT
DRAPE ORTHO SPLIT 77X108 STRL (DRAPES) ×4
DRAPE SURG 17X23 STRL (DRAPES) IMPLANT
DRAPE SURG ORHT 6 SPLT 77X108 (DRAPES) ×4 IMPLANT
DRSG OPSITE 4X5.5 SM (GAUZE/BANDAGES/DRESSINGS) ×8 IMPLANT
DRSG OPSITE POSTOP 4X6 (GAUZE/BANDAGES/DRESSINGS) ×4 IMPLANT
ELECT REM PT RETURN 9FT ADLT (ELECTROSURGICAL) ×4
ELECTRODE REM PT RTRN 9FT ADLT (ELECTROSURGICAL) ×2 IMPLANT
GAUZE 4X4 16PLY RFD (DISPOSABLE) IMPLANT
GAUZE SPONGE 2X2 8PLY STRL LF (GAUZE/BANDAGES/DRESSINGS) ×2 IMPLANT
GLOVE BIO SURGEON STRL SZ7.5 (GLOVE) ×4 IMPLANT
GLOVE ECLIPSE 9.0 STRL (GLOVE) ×4 IMPLANT
GLOVE EXAM NITRILE LRG STRL (GLOVE) IMPLANT
GLOVE EXAM NITRILE XL STR (GLOVE) IMPLANT
GLOVE EXAM NITRILE XS STR PU (GLOVE) IMPLANT
GOWN STRL REUS W/ TWL LRG LVL3 (GOWN DISPOSABLE) ×2 IMPLANT
GOWN STRL REUS W/ TWL XL LVL3 (GOWN DISPOSABLE) ×2 IMPLANT
GOWN STRL REUS W/TWL 2XL LVL3 (GOWN DISPOSABLE) IMPLANT
GOWN STRL REUS W/TWL LRG LVL3 (GOWN DISPOSABLE) ×2
GOWN STRL REUS W/TWL XL LVL3 (GOWN DISPOSABLE) ×2
HEMOSTAT SURGICEL 2X14 (HEMOSTASIS) IMPLANT
INTRODUCER SET COOK 14FR (MISCELLANEOUS) ×4 IMPLANT
KIT BASIN OR (CUSTOM PROCEDURE TRAY) ×4 IMPLANT
KIT TURNOVER KIT B (KITS) ×4 IMPLANT
MARKER SKIN DUAL TIP RULER LAB (MISCELLANEOUS) ×4 IMPLANT
NEEDLE INSUFFLATION 14GA 120MM (NEEDLE) ×4 IMPLANT
NS IRRIG 1000ML POUR BTL (IV SOLUTION) ×4 IMPLANT
OIL CARTRIDGE MAESTRO DRILL (MISCELLANEOUS) ×4
PACK LAMINECTOMY NEURO (CUSTOM PROCEDURE TRAY) ×4 IMPLANT
PAD ARMBOARD 7.5X6 YLW CONV (MISCELLANEOUS) ×4 IMPLANT
RUBBERBAND STERILE (MISCELLANEOUS) IMPLANT
SCISSORS ENDO CVD 5DCS (MISCELLANEOUS) ×4 IMPLANT
SET IRRIG TUBING LAPAROSCOPIC (IRRIGATION / IRRIGATOR) ×4 IMPLANT
SHEATH PERITONEAL INTRO 46 (MISCELLANEOUS) IMPLANT
SHEATH PERITONEAL INTRO 61 (MISCELLANEOUS) ×4 IMPLANT
SHUNT STRATA 11 SNAP REG (Shunt) ×4 IMPLANT
SLEEVE ENDOPATH XCEL 5M (ENDOMECHANICALS) ×4 IMPLANT
SPONGE GAUZE 2X2 STER 10/PKG (GAUZE/BANDAGES/DRESSINGS) ×2
SPONGE INTESTINAL PEANUT (DISPOSABLE) IMPLANT
SPONGE LAP 4X18 RFD (DISPOSABLE) IMPLANT
SPONGE SURGIFOAM ABS GEL SZ50 (HEMOSTASIS) IMPLANT
STAPLER VISISTAT 35W (STAPLE) ×4 IMPLANT
STRIP CLOSURE SKIN 1/2X4 (GAUZE/BANDAGES/DRESSINGS) ×6 IMPLANT
SUT CHROMIC 3 0 SH 27 (SUTURE) IMPLANT
SUT ETHILON 3 0 FSL (SUTURE) IMPLANT
SUT ETHILON 4 0 PS 2 18 (SUTURE) IMPLANT
SUT MNCRL AB 4-0 PS2 18 (SUTURE) ×4 IMPLANT
SUT NURALON 4 0 TR CR/8 (SUTURE) IMPLANT
SUT SILK 0 TIES 10X30 (SUTURE) IMPLANT
SUT SILK 2 0 TIES 17X18 (SUTURE) ×2
SUT SILK 2-0 18XBRD TIE BLK (SUTURE) ×2 IMPLANT
SUT SILK 3 0 SH 30 (SUTURE) IMPLANT
SUT VIC AB 2-0 CT2 18 VCP726D (SUTURE) ×4 IMPLANT
SUT VIC AB 3-0 SH 8-18 (SUTURE) ×4 IMPLANT
SUT VICRYL 4-0 PS2 18IN ABS (SUTURE) IMPLANT
SYR 5ML LL (SYRINGE) IMPLANT
SYR CONTROL 10ML LL (SYRINGE) ×8 IMPLANT
TOWEL GREEN STERILE (TOWEL DISPOSABLE) ×4 IMPLANT
TOWEL GREEN STERILE FF (TOWEL DISPOSABLE) ×4 IMPLANT
TRAY FOLEY MTR SLVR 16FR STAT (SET/KITS/TRAYS/PACK) IMPLANT
TRAY LAPAROSCOPIC MC (CUSTOM PROCEDURE TRAY) ×4 IMPLANT
TROCAR XCEL NON-BLD 5MMX100MML (ENDOMECHANICALS) ×4 IMPLANT
TUBING INSUFFLATION (TUBING) ×4 IMPLANT
UNDERPAD 30X30 (UNDERPADS AND DIAPERS) ×4 IMPLANT
WATER STERILE IRR 1000ML POUR (IV SOLUTION) ×8 IMPLANT

## 2018-08-26 NOTE — Transfer of Care (Signed)
Immediate Anesthesia Transfer of Care Note  Patient: [name]  Procedure(s) Performed: LEFT SIDED VENTRICULAR-PERITONEAL SHUNT (Left Head) LAPAROSCOPIC INSERTION VENTRICULAR-PERITONEAL (V-P) SHUNT (N/A Abdomen)  Patient Location: PACU  Anesthesia Type:General  Level of Consciousness: drowsy and responds to stimulation  Airway & Oxygen Therapy: Patient Spontanous Breathing and Patient connected to nasal cannula oxygen  Post-op Assessment: Report given to RN and Post -op Vital signs reviewed and stable  Post vital signs: Reviewed and stable  Last Vitals:  Vitals Value Taken Time  BP 139/97 08/26/2018  3:24 PM  Temp    Pulse 76 08/26/2018  3:27 PM  Resp 22 08/26/2018  3:27 PM  SpO2 100 % 08/26/2018  3:27 PM  Vitals shown include unvalidated device data.  Last Pain:  Vitals:   08/26/18 1329  TempSrc:   PainSc: 0-No pain         Complications: No apparent anesthesia complications

## 2018-08-26 NOTE — Transfer of Care (Addendum)
Immediate Anesthesia Transfer of Care Note  Patient: Alezander Dimaano  Procedure(s) Performed: SHUNT REVISION VENTRICULAR-PERITONEAL (Left Head)  Patient Location: PACU  Anesthesia Type:General  Level of Consciousness: drowsy and lethargic  Airway & Oxygen Therapy: Patient Spontanous Breathing and Patient connected to nasal cannula oxygen  Post-op Assessment: Report given to RN and Post -op Vital signs reviewed and stable  Post vital signs: Reviewed and stable  Last Vitals:  Vitals Value Taken Time  BP 124/97 08/26/2018  5:53 PM  Temp    Pulse 85 08/26/2018  5:54 PM  Resp 13 08/26/2018  5:54 PM  SpO2 94 % 08/26/2018  5:54 PM  Vitals shown include unvalidated device data.  Last Pain:  Vitals:   08/26/18 1329  TempSrc:   PainSc: 0-No pain         Complications: No apparent anesthesia complication. Grimace to painful stimuli, raises eyebrows to name. Dr. Jordan Likes aware.

## 2018-08-26 NOTE — Op Note (Signed)
Date of procedure: 08/26/2018  Date of dictation: Same  Service: Neurosurgery  Preoperative diagnosis: VP shunt malfunction  Postoperative diagnosis: Same  Procedure Name: Left occipital VP shunt revision  Surgeon:Alyzabeth Pontillo A.Ireta Pullman, M.D.  Asst. Surgeon: None  Anesthesia: General  Indication: 62 year old male status post VP shunt placement earlier today.  Postoperative CT scan demonstrates poor positioning of his left ventricular catheter.  Patient presents now for repositioning of catheter.  Operative note: After induction anesthesia, patient position supine with neck turned toward the right.  Patient's left occipital scalp prepped and draped sterilely.  Incision reopened.  Shunt valve and catheter disassembled.  Ventricular catheter removed.  No evidence of hemorrhage.  A new 11 cm ventricular catheter was then passed into the lateral ventricle with good return of CSF under pressure.  This was then attached to the proximal aspect of the VP shunt system.  Wound was then irrigated and then closed in typical fashion.  Sterile dressing was applied.  No apparent complications.

## 2018-08-26 NOTE — Brief Op Note (Signed)
08/25/2018 - 08/26/2018  5:37 PM  PATIENT:  Charles Marquez  62 y.o. male  PRE-OPERATIVE DIAGNOSIS:  VP SHUNT FAILURE  POST-OPERATIVE DIAGNOSIS:  * No post-op diagnosis entered *  PROCEDURE:  Procedure(s): SHUNT REVISION VENTRICULAR-PERITONEAL (Left)  SURGEON:  Surgeon(s) and Role:    * Julio Sicks, MD - Primary  PHYSICIAN ASSISTANT:   ASSISTANTS:    ANESTHESIA:   general  EBL: Minimal  BLOOD ADMINISTERED:none  DRAINS: none   LOCAL MEDICATIONS USED:  NONE  SPECIMEN:  No Specimen  DISPOSITION OF SPECIMEN:  N/A  COUNTS:  YES  TOURNIQUET:  * No tourniquets in log *  DICTATION: .Dragon Dictation  PLAN OF CARE: Admit to inpatient   PATIENT DISPOSITION:  PACU - hemodynamically stable.   Delay start of Pharmacological VTE agent (>24hrs) due to surgical blood loss or risk of bleeding: yes

## 2018-08-26 NOTE — Anesthesia Procedure Notes (Signed)
Procedure Name: Intubation Date/Time: 08/26/2018 2:11 PM Performed by: White, Amedeo Plenty, CRNA Pre-anesthesia Checklist: Patient identified, Emergency Drugs available, Suction available and Patient being monitored Patient Re-evaluated:Patient Re-evaluated prior to induction Oxygen Delivery Method: Circle System Utilized Preoxygenation: Pre-oxygenation with 100% oxygen Induction Type: IV induction Ventilation: Mask ventilation without difficulty and Oral airway inserted - appropriate to patient size Laryngoscope Size: Mac and 4 Grade View: Grade I Tube type: Oral Tube size: 7.5 mm Number of attempts: 1 Airway Equipment and Method: Stylet and Oral airway Placement Confirmation: ETT inserted through vocal cords under direct vision,  positive ETCO2 and breath sounds checked- equal and bilateral Secured at: 22 cm Tube secured with: Tape Dental Injury: Teeth and Oropharynx as per pre-operative assessment

## 2018-08-26 NOTE — Anesthesia Preprocedure Evaluation (Signed)
Anesthesia Evaluation  Patient identified by MRN, date of birth, ID band Patient awake    Reviewed: Allergy & Precautions, H&P , NPO status , Patient's Chart, lab work & pertinent test results  Airway Mallampati: II  TM Distance: >3 FB Neck ROM: Full    Dental no notable dental hx. (+) Teeth Intact, Dental Advisory Given   Pulmonary neg pulmonary ROS,    Pulmonary exam normal breath sounds clear to auscultation       Cardiovascular negative cardio ROS   Rhythm:Regular Rate:Normal     Neuro/Psych Anxiety Hydrocephalus TIACVA, No Residual Symptoms    GI/Hepatic negative GI ROS, Neg liver ROS,   Endo/Other  negative endocrine ROS  Renal/GU negative Renal ROS  negative genitourinary   Musculoskeletal  (+) Arthritis , Osteoarthritis,    Abdominal   Peds  Hematology negative hematology ROS (+)   Anesthesia Other Findings   Reproductive/Obstetrics negative OB ROS                             Anesthesia Physical Anesthesia Plan  ASA: III  Anesthesia Plan: General   Post-op Pain Management:    Induction: Intravenous  PONV Risk Score and Plan: 3 and Ondansetron, Dexamethasone and Treatment may vary due to age or medical condition  Airway Management Planned: Oral ETT  Additional Equipment:   Intra-op Plan:   Post-operative Plan: Extubation in OR  Informed Consent: I have reviewed the patients History and Physical, chart, labs and discussed the procedure including the risks, benefits and alternatives for the proposed anesthesia with the patient or authorized representative who has indicated his/her understanding and acceptance.   Dental advisory given  Plan Discussed with: CRNA  Anesthesia Plan Comments:         Anesthesia Quick Evaluation

## 2018-08-26 NOTE — Brief Op Note (Signed)
08/25/2018 - 08/26/2018  3:13 PM  PATIENT:  Celene Squibb  62 y.o. male  PRE-OPERATIVE DIAGNOSIS:  SHUNT PLACEMENT  POST-OPERATIVE DIAGNOSIS:  SHUNT PLACEMENT  PROCEDURE:  Procedure(s): LEFT SIDED VENTRICULAR-PERITONEAL SHUNT (Left) LAPAROSCOPIC INSERTION VENTRICULAR-PERITONEAL (V-P) SHUNT (N/A)  SURGEON:  Surgeon(s) and Role:    * Julio Sicks, MD - Primary    * Axel Filler, MD - Assisting  PHYSICIAN ASSISTANT:   ASSISTANTS:    ANESTHESIA:   general  EBL:  Minimal    BLOOD ADMINISTERED:none  DRAINS: none   LOCAL MEDICATIONS USED:  XYLOCAINE   SPECIMEN:  No Specimen  DISPOSITION OF SPECIMEN:  N/A  COUNTS:  YES  TOURNIQUET:  * No tourniquets in log *  DICTATION: .Dragon Dictation  PLAN OF CARE: Admit to inpatient   PATIENT DISPOSITION:  PACU - hemodynamically stable.   Delay start of Pharmacological VTE agent (>24hrs) due to surgical blood loss or risk of bleeding: yes

## 2018-08-26 NOTE — Progress Notes (Signed)
Patient with some degree of a aphasia and some left-sided neglect.  Postop CT scan demonstrates deviation of the ventricular catheter into the left sylvian fissure.  No evidence of hemorrhage or stroke.  Poorly positioned left ventriculostomy catheter.  I discussed situation with the family.  I recommended return to the operating room for replacement of ventriculostomy catheter.  With regard to the patient's postoperative deficits I am hopeful these are secondary to some contusions along the shunt tract which occurred during passage of the ventricular catheter.  I am hopeful these will improve with time.  I see no reason why this should be permanent.  I discussed situation with the patient's wife.  I discussed the risks and benefits and need to return to the operating room for revision of his shunt.  She is aware and wishes to proceed.

## 2018-08-26 NOTE — Progress Notes (Signed)
Patient readmitted with recurrence of his hydrocephalus now with symptoms.  Patient has been afebrile.  He has no other new neurologic signs aside from some confusion and gait ataxia.  He presents now for VP shunting.  I discussed situation with the family.  He is currently awake and alert.  He is oriented and reasonably appropriate.  His speech is fluent.  His judgment and insight appear intact.  Cranial nerve function normal bilaterally.  Motor examination normal bilaterally.  Sensory examination nonfocal.  Wounds well-healed.  Abdomen soft.  Neck supple.  Patient with recurrent communicating hydrocephalus.  Patient status post recent VP shunt infection treated with antibiotics and now resolved.  Patient now clear for VP shunt replacement.  Plan left occipital VP shunt with general surgery laparoscopic assist for the abdominal portion.

## 2018-08-26 NOTE — Anesthesia Procedure Notes (Signed)
Procedure Name: Intubation Date/Time: 08/26/2018 5:12 PM Performed by: Imagene Riches, CRNA Pre-anesthesia Checklist: Patient identified, Emergency Drugs available, Suction available and Patient being monitored Patient Re-evaluated:Patient Re-evaluated prior to induction Oxygen Delivery Method: Circle System Utilized Preoxygenation: Pre-oxygenation with 100% oxygen Induction Type: IV induction Ventilation: Mask ventilation without difficulty Laryngoscope Size: Mac and 4 Grade View: Grade I Tube type: Oral Tube size: 7.5 mm Number of attempts: 1 Airway Equipment and Method: Stylet and Oral airway Placement Confirmation: ETT inserted through vocal cords under direct vision,  positive ETCO2 and breath sounds checked- equal and bilateral Secured at: 23 cm Tube secured with: Tape Dental Injury: Teeth and Oropharynx as per pre-operative assessment

## 2018-08-26 NOTE — Op Note (Signed)
Date of procedure: 08/26/2018  Date of dictation: Same  Service: Neurosurgery  Preoperative diagnosis: Hydrocephalus, communicating  Postoperative diagnosis: Same  Procedure Name: Placement left occipital VP shunt  Surgeon:Tramya Schoenfelder A.Alexica Schlossberg, M.D.  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  Patient's infection now cleared.  He has recurrent symptoms of hydrocephalus.  He presents now for left-sided VP shunt placement.  Operative note: After induction of anesthesia, patient position supine with his neck turned toward the right.  Patient's left occipital region neck chest and abdomen prepped and draped sterilely.  Dr. Derrell Lolling of the neurosurgery department performed a laparoscopic approach.  He took down adhesions of the abdominal wall and provided safe entry point for passage of catheter into the peritoneal cavity.  Made a left occipital incision.  Carried this down to the pericranium.  Made a pocket inferior to the incision for valve placement.  A bur hole was then made using the high-speed drill.  Dura was coagulated.  Shunt passer was then passed from the cranial wound and exited through a separate stab incision in the left upper quadrant.  Dura was coagulated and incised.  Ventricular catheter was then passed into the lateral ventricle with difficulty.  I made a number of passes and eventually received proteinaceous slow flow of CSF.  The catheter was attached to the shunt system and the valve pumped and refilled easily with good flow through the distal catheter.  We then passed the distal catheter into the abdominal cavity and this was placed by Dr. Derrell Lolling in the lateral gutter.  Wounds were then irrigated fanlike solution.  Wounds were then closed with Vicryl sutures.  Staples were applied to the cranial wound.  Sterile dressings were applied.  No apparent complications.  Plan for  postoperative CT scan to ensure good catheter placement.

## 2018-08-26 NOTE — Anesthesia Preprocedure Evaluation (Addendum)
Anesthesia Evaluation  Patient identified by MRN, date of birth, ID band Patient confused    Reviewed: Allergy & Precautions, H&P , NPO status , Patient's Chart, lab work & pertinent test results  Airway Mallampati: II  TM Distance: >3 FB Neck ROM: Full    Dental no notable dental hx. (+) Teeth Intact, Dental Advisory Given   Pulmonary neg pulmonary ROS,    Pulmonary exam normal breath sounds clear to auscultation       Cardiovascular negative cardio ROS   Rhythm:Regular Rate:Normal     Neuro/Psych Anxiety Hydrocephalus TIACVA, No Residual Symptoms    GI/Hepatic negative GI ROS, Neg liver ROS,   Endo/Other  negative endocrine ROS  Renal/GU negative Renal ROS  negative genitourinary   Musculoskeletal  (+) Arthritis , Osteoarthritis,    Abdominal   Peds  Hematology negative hematology ROS (+)   Anesthesia Other Findings   Reproductive/Obstetrics negative OB ROS                            Anesthesia Physical  Anesthesia Plan  ASA: III and emergent  Anesthesia Plan: General   Post-op Pain Management:    Induction: Intravenous  PONV Risk Score and Plan: 3 and Ondansetron, Dexamethasone and Treatment may vary due to age or medical condition  Airway Management Planned: Oral ETT  Additional Equipment:   Intra-op Plan:   Post-operative Plan: Extubation in OR and Possible Post-op intubation/ventilation  Informed Consent: I have reviewed the patients History and Physical, chart, labs and discussed the procedure including the risks, benefits and alternatives for the proposed anesthesia with the patient or authorized representative who has indicated his/her understanding and acceptance.   Dental advisory given  Plan Discussed with: CRNA  Anesthesia Plan Comments:       Anesthesia Quick Evaluation

## 2018-08-26 NOTE — Op Note (Signed)
08/26/2018  3:10 PM  PATIENT:  Charles Marquez  62 y.o. male  PRE-OPERATIVE DIAGNOSIS:  hydocephelus  POST-OPERATIVE DIAGNOSIS:  same  PROCEDURE:  Procedure(s): LEFT SIDED VENTRICULAR-PERITONEAL SHUNT (Left) LAPAROSCOPIC INSERTION VENTRICULAR-PERITONEAL (V-P) SHUNT (N/A)  SURGEON:  Surgeon(s) and Role:    * #1Pool, Sherilyn Cooter, MD     * #2Ramirez, Jed Limerick, MD   ASSISTANTS: none   ANESTHESIA:   local and general  EBL:  minimal   BLOOD ADMINISTERED:none  DRAINS: VP shunt within the intrabdominal  cavity  LOCAL MEDICATIONS USED:  BUPIVICAINE   SPECIMEN:  No Specimen  DISPOSITION OF SPECIMEN:  N/A  COUNTS:  YES  TOURNIQUET:  * No tourniquets in log *  DICTATION: .Dragon Dictation  Details procedure: This surgery was done in conjunction with Dr. Dutch Quint of neurosurgery. He will dictate his portion under separate cover.  After the patient was consented she was taken back to the OR and placed in the supine position with bilateral SCDs in place. Patient underwent general anesthesia. Patient was prepped and draped in the standard fashion. A timeout was called all facts verified.  A Veress needle technique was used to inspect the abdomen to 15 mmHg the left lower quadrant. Subsequently, a trocar and camera placed intra-abdominally. There was no injury to intra-abdominal organs. There was some thin adhesions in the midline and left upper quadrant area.  A second 5 mm trochars placed in the suprapubic space under direct visualization. At this time the adhesions were taken down from anterior abdominal wall with sharp dissection.  There was minimal bleeding that was cauterized.  An area of the left subcostal margin site was chosen for the tunneling of the VP shunt. This was the area that was used to help the insertion of the VP shunt into the abdominal cavity. Once the catheter was brought into the abdominal cavity this was placed into the pelvis and direct visualization. There were no  loops and the catheter lay flat on the viscera.  At this time the insufflation was evacuated. All trochars removed. The skin was reapproximated all trocar sites using 4-0 Monocryl subcuticular fashion. The skin was dressed with Dermabond. The patient tolerated procedure well was taken to recovery in stable condition.    PLAN OF CARE: Admit to inpatient   PATIENT DISPOSITION:  PACU - hemodynamically stable.   Delay start of Pharmacological VTE agent (>24hrs) due to surgical blood loss or risk of bleeding: per primary

## 2018-08-26 NOTE — Progress Notes (Signed)
Initial Nutrition Assessment  DOCUMENTATION CODES:   Not applicable  INTERVENTION:    Monitor for diet advancement/toleration  Ensure Enlive po BID, each supplement provides 350 kcal and 20 grams of protein once diet advanced.  NUTRITION DIAGNOSIS:   Increased nutrient needs related to acute illness as evidenced by estimated needs.  GOAL:   Patient will meet greater than or equal to 90% of their needs  MONITOR:   PO intake, Supplement acceptance, Diet advancement, Labs, I & O's, Weight trends  REASON FOR ASSESSMENT:   Malnutrition Screening Tool    ASSESSMENT:   Patient with PMH significant for TIA and communicating hydrocephalus s/p VP shunt placement (07/22/18). Recently admitted 9/22 with nausea, vomiting, unsteady gait, and memory impairment. VP shunt showed to grow moderate klebsiella and was removed 9/27, EVD placed. Pt presents this admission requiring drainage of his hydrocephalus. Plan for VP shunt placement 10/14.    Pt denies any loss in appetite PTA. States even though he has felt poor, he has been able to maintain eating 3 meals daily. Pt does not feel like his meal composition has changed during his recent admission (protein, grain, vegetable). Wife states the only thing she has noticed is pt stopped eating his bedtime snack that consisted of "junk food" because he started going to bed earlier. During his last admission pt had great meal compliance consuming 100% of meals. Discussed the importance of protein intake for post op healing. Pt amendable to Ensure s/p shunt placement.   Wife endorses pt's UBW stays around 195 lb and a recent wt loss of 15 lb since becoming sick. Records indicate pt weighed 182 lb on 9/26 (last taken weight) and 179 lb this admission (insignificant wt loss for time frame). Nutrition-Focused physical exam completed.   Medications reviewed.  Labs reviewed: Na 128 (L) corrected calcium 9.2 (wdl)  NUTRITION - FOCUSED PHYSICAL EXAM:    Most  Recent Value  Orbital Region  No depletion  Upper Arm Region  Mild depletion  Thoracic and Lumbar Region  No depletion  Buccal Region  No depletion  Temple Region  No depletion  Clavicle Bone Region  Mild depletion  Clavicle and Acromion Bone Region  No depletion  Scapular Bone Region  No depletion  Dorsal Hand  No depletion  Patellar Region  No depletion  Anterior Thigh Region  Mild depletion  Posterior Calf Region  Mild depletion  Edema (RD Assessment)  None     Diet Order:   Diet Order            Diet NPO time specified  Diet effective midnight              EDUCATION NEEDS:      Skin:  Skin Assessment: Skin Integrity Issues: Skin Integrity Issues:: Incisions Incisions: head  Last BM:  08/21/18  Height:   Ht Readings from Last 1 Encounters:  08/25/18 5\' 11"  (1.803 m)    Weight:   Wt Readings from Last 1 Encounters:  08/26/18 81.2 kg    Ideal Body Weight:  78.2 kg  BMI:  Body mass index is 24.97 kg/m.  Estimated Nutritional Needs:   Kcal:  2100-2300 kcal  Protein:  110-125 grams  Fluid:  >/= 2.1 L/day   Vanessa Kick RD, LDN Clinical Nutrition Pager # - [phone]

## 2018-08-26 NOTE — Consult Note (Signed)
Reason for Consult:VP shunt placement Referring Physician: Dr. Sharion Balloon Charles Marquez is an 62 y.o. male.  HPI: Patient is a 62 year old male with a history of previous strokes, TIAs, hypercholesterolemia, and hydrocephalus, who is requiring VP shunt placement.  Patient has had previous VP shunt in the past which have resulted in both infection and migration into the GI tract.  Patient recently had one removed on 23 September.  Patient has been shunt free however comes back with hydrocephalus and is requiring drainage of his hydrocephalus.  Patient only had a previous left femoral hernia repair, and VP shunt placements in the past.  Past Medical History:  Diagnosis Date  . Anxiety   . Hypercholesteremia   . Stroke ([place])    tia's  . TIA (transient ischemic attack)    09.15    Past Surgical History:  Procedure Laterality Date  . Fractured arm Left 12  . HERNIA REPAIR Right 3/12  . LOOP RECORDER INSERTION N/A 04/10/2017   Procedure: Loop Recorder Insertion;  Surgeon: Thompson Grayer, MD;  Location: [facility];  Service: Cardiovascular;  Laterality: N/A;  . SHUNT REMOVAL Right 03/13/2016   Procedure: SHUNT REMOVAL;  Surgeon: Earnie Larsson, MD;  Location: [facility];  Service: Neurosurgery;  Laterality: Right;  . SHUNT REMOVAL Right 08/09/2018   Procedure: SHUNT REMOVAL With Placement of Ventricular Catheter;  Surgeon: Consuella Lose, MD;  Location: [facility];  Service: Neurosurgery;  Laterality: Right;  . SHUNT REVISION Right 08/05/2018   Procedure: SHUNT REVISION;  Surgeon: Earnie Larsson, MD;  Location: [facility];  Service: Neurosurgery;  Laterality: Right;  [name] VASECTOMY  10/02/1997  . VENTRICULOPERITONEAL SHUNT Right 12/18/2014   Procedure: Shunt Placment - right occipital VP shunt ;  Surgeon: Charlie Pitter, MD;  Location: [facility];  Service: Neurosurgery;  Laterality: Right;  Shunt Placment - right occipital VP shunt   . VENTRICULOPERITONEAL SHUNT Right 07/22/2018   Procedure: Shunt  Placment right occipital;  Surgeon: Earnie Larsson, MD;  Location: [facility];  Service: Neurosurgery;  Laterality: Right;  . VENTRICULOSTOMY Right 08/09/2018   Procedure: VENTRICULOSTOMY;  Surgeon: Consuella Lose, MD;  Location: [facility];  Service: Neurosurgery;  Laterality: Right;    Family History  Problem Relation Age of Onset  . COPD Mother   . Lung cancer Father   . Alzheimer's disease Father     Social History:  reports that he has never smoked. He has never used smokeless tobacco. He reports that he drinks alcohol. He reports that he does not use drugs.  Allergies: No Known Allergies  Medications: I have reviewed the patient's current medications.  Results for orders placed or performed during the hospital encounter of 08/25/18 (from the past 48 hour(s))  Comprehensive metabolic panel     Status: Abnormal   Collection Time: 08/25/18  4:58 PM  Result Value Ref Range   Sodium 127 (L) 135 - 145 mmol/L   Potassium 4.0 3.5 - 5.1 mmol/L   Chloride 96 (L) 98 - 111 mmol/L   CO2 22 22 - 32 mmol/L   Glucose, Bld 129 (H) 70 - 99 mg/dL   BUN 20 6 - 20 mg/dL   Creatinine, Ser 0.85 0.61 - 1.24 mg/dL   Calcium 8.6 (L) 8.9 - 10.3 mg/dL   Total Protein 6.2 (L) 6.5 - 8.1 g/dL   Albumin 3.2 (L) 3.5 - 5.0 g/dL   AST 19 15 - 41 U/L   ALT 21 0 - 44 U/L   Alkaline  Phosphatase 45 38 - 126 U/L   Total Bilirubin 0.8 0.3 - 1.2 mg/dL   GFR calc non Af Amer >60 >60 mL/min   GFR calc Af Amer >60 >60 mL/min    Comment: (NOTE) The eGFR has been calculated using the CKD EPI equation. This calculation has not been validated in all clinical situations. eGFR's persistently <60 mL/min signify possible Chronic Kidney Disease.    Anion gap 9 5 - 15    Comment: Performed at [facility] [address], [place], [place] [address]  CBC with Differential     Status: Abnormal   Collection Time: 08/25/18  4:58 PM  Result Value Ref Range   WBC 12.7 (H) 4.0 - 10.5 K/uL   RBC 4.48 4.22 - 5.81 MIL/uL    Hemoglobin 14.2 13.0 - 17.0 g/dL   HCT 41.0 39.0 - 52.0 %   MCV 91.5 80.0 - 100.0 fL   MCH 31.7 26.0 - 34.0 pg   MCHC 34.6 30.0 - 36.0 g/dL   RDW 12.6 11.5 - 15.5 %   Platelets 191 150 - 400 K/uL   nRBC 0.0 0.0 - 0.2 %   Neutrophils Relative % 79 %   Neutro Abs 10.1 (H) 1.7 - 7.7 K/uL   Lymphocytes Relative 10 %   Lymphs Abs 1.2 0.7 - 4.0 K/uL   Monocytes Relative 10 %   Monocytes Absolute 1.2 (H) 0.1 - 1.0 K/uL   Eosinophils Relative 1 %   Eosinophils Absolute 0.1 0.0 - 0.5 K/uL   Basophils Relative 0 %   Basophils Absolute 0.0 0.0 - 0.1 K/uL   Immature Granulocytes 0 %   Abs Immature Granulocytes 0.03 0.00 - 0.07 K/uL    Comment: Performed at [facility] [address], [place], [place] [address]  I-Stat CG4 Lactic Acid, ED     Status: None   Collection Time: 08/25/18  5:04 PM  Result Value Ref Range   Lactic Acid, Venous 1.68 0.5 - 1.9 mmol/L  I-Stat CG4 Lactic Acid, ED     Status: None   Collection Time: 08/25/18  6:08 PM  Result Value Ref Range   Lactic Acid, Venous 1.90 0.5 - 1.9 mmol/L  Basic metabolic panel     Status: Abnormal   Collection Time: 08/25/18 11:20 PM  Result Value Ref Range   Sodium 128 (L) 135 - 145 mmol/L   Potassium 3.9 3.5 - 5.1 mmol/L   Chloride 96 (L) 98 - 111 mmol/L   CO2 22 22 - 32 mmol/L   Glucose, Bld 148 (H) 70 - 99 mg/dL   BUN 20 6 - 20 mg/dL   Creatinine, Ser 0.90 0.61 - 1.24 mg/dL   Calcium 8.4 (L) 8.9 - 10.3 mg/dL   GFR calc non Af Amer >60 >60 mL/min   GFR calc Af Amer >60 >60 mL/min    Comment: (NOTE) The eGFR has been calculated using the CKD EPI equation. This calculation has not been validated in all clinical situations. eGFR's persistently <60 mL/min signify possible Chronic Kidney Disease.    Anion gap 10 5 - 15    Comment: Performed at [facility] [address], [place] [address]  CBC     Status: Abnormal   Collection Time: 08/25/18 11:20 PM  Result Value Ref Range   WBC 14.2 (H) 4.0 - 10.5  K/uL   RBC 4.25 4.22 - 5.81 MIL/uL   Hemoglobin 13.4 13.0 - 17.0 g/dL   HCT  39.0 39.0 - 52.0 %   MCV 91.8 80.0 - 100.0 fL   MCH 31.5 26.0 - 34.0 pg   MCHC 34.4 30.0 - 36.0 g/dL   RDW 12.6 11.5 - 15.5 %   Platelets 116 (L) 150 - 400 K/uL    Comment: REPEATED TO VERIFY PLATELET COUNT CONFIRMED BY SMEAR SPECIMEN CHECKED FOR CLOTS Immature Platelet Fraction may be clinically indicated, consider ordering this additional test [id number]    nRBC 0.0 0.0 - 0.2 %    Comment: Performed at [facility] [address], [place], [place] [address]  APTT     Status: Abnormal   Collection Time: 08/25/18 11:20 PM  Result Value Ref Range   aPTT 20 (L) 24 - 36 seconds    Comment: Performed at [facility] [address], [place], [place] [address]  Protime-INR     Status: None   Collection Time: 08/25/18 11:20 PM  Result Value Ref Range   Prothrombin Time 14.0 11.4 - 15.2 seconds   INR 1.08     Comment: Performed at [facility], [place] [address], [place], [place] [address]    Ct Head Wo Contrast  Result Date: 08/25/2018 CLINICAL DATA:  Noncommunicating hydrocephalus. VP shunt revision, September, with subsequent infection. Recent EVD removal. Altered mental status. EXAM: CT HEAD WITHOUT CONTRAST TECHNIQUE: Contiguous axial images were obtained from the base of the skull through the vertex without intravenous contrast. COMPARISON:  Multiple priors, most recent 08/22/2018. FINDINGS: Brain: Continued worsening hydrocephalus following EVD removal. Transverse diameter of the third ventricle measures 16 mm, as compared with 10 mm on 08/22/2018. Marked ballooning of the lateral ventricles, and temporal horns. Increasing transependymal absorption. Dystrophic calcification along the tract of the posterior burr hole in the RIGHT parietal region Vascular: No hyperdense vessel or unexpected calcification. Skull: Calvarial defects related to previous placement of ventricular shunt catheters, but  no concerning osseous abnormality. Sinuses/Orbits: Negative Other: None IMPRESSION: Marked worsening hydrocephalus, involving the lateral and third ventricles. In the setting of altered mental status, repeat EVD may be warranted. These results were called by telephone at the time of interpretation on 08/25/2018 at 6:54 pm to Dr. Jola Schmidt , who verbally acknowledged these results. Electronically Signed   By: Staci Righter M.D.   On: 08/25/2018 18:55    Review of Systems  Constitutional: Negative for chills, fever and malaise/fatigue.  HENT: Negative for ear discharge, hearing loss and sore throat.   Eyes: Negative for blurred vision and discharge.  Respiratory: Negative for cough and shortness of breath.   Cardiovascular: Negative for chest pain, orthopnea and leg swelling.  Gastrointestinal: Negative for abdominal pain, constipation, diarrhea, heartburn, nausea and vomiting.  Musculoskeletal: Negative for myalgias and neck pain.  Skin: Negative for itching and rash.  Neurological: Negative for dizziness, focal weakness, seizures and loss of consciousness.  Endo/Heme/Allergies: Negative for environmental allergies. Does not bruise/bleed easily.  Psychiatric/Behavioral: Negative for depression and suicidal ideas.  All other systems reviewed and are negative.  Blood pressure (!) 144/96, pulse 63, temperature 98.4 F (36.9 C), temperature source Axillary, resp. rate 14, height _0  (1.803 m), weight 86.2 kg, SpO2 98 %. Physical Exam  Constitutional: He is oriented to person, place, and time. Vital signs are normal. He appears well-developed and well-nourished.  Conversant No acute distress  Eyes: Lids are normal. No scleral icterus.  No lid lag Moist conjunctiva  Neck: No tracheal tenderness present. No thyromegaly present.  No cervical lymphadenopathy  Cardiovascular: Normal rate, regular rhythm and intact distal pulses.  No murmur heard. Respiratory: Effort normal and breath sounds  normal. He has no wheezes. He has no rales.  GI: Soft. He exhibits no distension. There is no hepatosplenomegaly. There is no tenderness. There is no rebound and no guarding. No hernia.  Neurological: He is alert and oriented to person, place, and time.  Normal gait and station  Skin: Skin is warm. No rash noted. No cyanosis. Nails show no clubbing.  Normal skin turgor  Psychiatric: Judgment normal.  Appropriate affect    Assessment/Plan: 62 year old male with hydrocephalus Hypercholesterolemia TIAs Stroke  Plan: 1.  We will proceed to the operating room with Dr. Trenton Gammon to assist him with laparoscopic VP shunt placement. 2.  Discussed with the patient and his wife the risks and benefits of the procedure to include but not limited to: Infection, bleeding, demonstrated structures, possible need for further surgery.  The patient and his wife voiced understanding wish to proceed.  Ralene Ok 08/26/2018, 11:03 AM

## 2018-08-27 ENCOUNTER — Encounter (HOSPITAL_COMMUNITY): Payer: Self-pay | Admitting: Neurosurgery

## 2018-08-27 ENCOUNTER — Inpatient Hospital Stay (HOSPITAL_COMMUNITY): Payer: BLUE CROSS/BLUE SHIELD

## 2018-08-27 ENCOUNTER — Ambulatory Visit (INDEPENDENT_AMBULATORY_CARE_PROVIDER_SITE_OTHER): Payer: BLUE CROSS/BLUE SHIELD | Admitting: *Deleted

## 2018-08-27 DIAGNOSIS — I639 Cerebral infarction, unspecified: Secondary | ICD-10-CM

## 2018-08-27 MED ORDER — VANCOMYCIN HCL IN DEXTROSE 1-5 GM/200ML-% IV SOLN
1000.0000 mg | Freq: Three times a day (TID) | INTRAVENOUS | Status: DC
  Administered 2018-08-27 – 2018-08-28 (×3): 1000 mg via INTRAVENOUS
  Filled 2018-08-27 (×3): qty 200

## 2018-08-27 MED ORDER — VANCOMYCIN HCL 10 G IV SOLR
1500.0000 mg | Freq: Once | INTRAVENOUS | Status: AC
  Administered 2018-08-27: 1500 mg via INTRAVENOUS
  Filled 2018-08-27: qty 1500

## 2018-08-27 MED ORDER — ORAL CARE MOUTH RINSE
15.0000 mL | Freq: Two times a day (BID) | OROMUCOSAL | Status: DC
  Administered 2018-08-27: 15 mL via OROMUCOSAL

## 2018-08-27 MED ORDER — CHLORHEXIDINE GLUCONATE 0.12 % MT SOLN
15.0000 mL | Freq: Two times a day (BID) | OROMUCOSAL | Status: DC
  Administered 2018-08-27: 15 mL via OROMUCOSAL

## 2018-08-27 MED FILL — Thrombin (Recombinant) For Soln 5000 Unit: CUTANEOUS | Qty: 5000 | Status: AC

## 2018-08-27 NOTE — Progress Notes (Signed)
Pharmacy Antibiotic Note  Charles Marquez is a 62 y.o. 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  Pharmacy has been consulted for Vancomycin dosing.  Plan: Vancomycin 1500 mg IV x 1, followed by Vancomycin 1000 IV every 8 hours.  Goal trough 15-20 mcg/mL.  Monitor renal function. Will check levels if therapy continues that long.  Height: 5\' 11"  (180.3 cm) Weight: 179 lb (81.2 kg) IBW/kg (Calculated) : 75.3  Temp (24hrs), Avg:97.9 F (36.6 C), Min:96.6 F (35.9 C), Max:98.9 F (37.2 C)  Recent Labs  Lab 08/25/18 1658 08/25/18 1704 08/25/18 1808 08/25/18 2320  WBC 12.7*  --   --  14.2*  CREATININE 0.85  --   --  0.90  LATICACIDVEN  --  1.68 1.90  --     Estimated Creatinine Clearance: 93 mL/min (by C-G formula based on SCr of 0.9 mg/dL).    No Known Allergies  Antimicrobials this admission: Vanc 10/15 >>  zosyn 10/15 >>   Thank you for allowing pharmacy to be a part of this patient's care.  Jeanella Cara, PharmD, [facility] Clinical Pharmacist Please see AMION for all Pharmacists' Contact Phone Numbers 08/27/2018, 9:11 AM

## 2018-08-27 NOTE — H&P (Signed)
Charles Marquez is an 62 y.o. male.   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  Shunt infection treated with IV antibiotics till infection cleared.  Patient was slow reaccumulation of hydrocephalus and it was decided to discharge him home and have him be readmitted when he became symptomatic again from his hydrocephalus.  Patient presents now with some increased confusion and gait instability.  Past Medical History:  Diagnosis Date  . Anxiety   . Hypercholesteremia   . 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 TIA (transient ischemic attack)    09.15    Past Surgical History:  Procedure Laterality Date  . Fractured arm Left 12  . HERNIA REPAIR Right 3/12  . LOOP RECORDER INSERTION N/A 04/10/2017   Procedure: Loop Recorder Insertion;  Surgeon: Thompson Grayer, MD;  Location: [facility];  Service: Cardiovascular;  Laterality: N/A;  . SHUNT REMOVAL Right 03/13/2016   Procedure: SHUNT REMOVAL;  Surgeon: Earnie Larsson, MD;  [facility];  Service: Neurosurgery;  Laterality: Right;  . SHUNT REMOVAL Right 08/09/2018   Procedure: SHUNT REMOVAL With Placement of Ventricular Catheter;  Surgeon: Consuella Lose, MD;  Location: [facility];  Service: Neurosurgery;  Laterality: Right;  . SHUNT REVISION Right 08/05/2018   Procedure: SHUNT REVISION;  Surgeon: Earnie Larsson, MD;  Location: [facility];  Service: Neurosurgery;  Laterality: Right;  [name] VASECTOMY  10/02/1997  . VENTRICULOPERITONEAL SHUNT Right 12/18/2014   Procedure: Shunt Placment - right occipital VP shunt ;  Surgeon: Charlie Pitter, MD;  Location: [facility];  Service: Neurosurgery;  Laterality: Right;  Shunt Placment - right occipital VP shunt   . VENTRICULOPERITONEAL SHUNT Right 07/22/2018   Procedure: Shunt Placment right occipital;  Surgeon: Earnie Larsson, MD;  Location: [facility];  Service: Neurosurgery;  Laterality: Right;  . VENTRICULOSTOMY Right 08/09/2018   Procedure:  VENTRICULOSTOMY;  Surgeon: Consuella Lose, MD;  Location: [facility];  Service: Neurosurgery;  Laterality: Right;    Family History  Problem Relation Age of Onset  . COPD Mother   . Lung cancer Father   . Alzheimer's disease Father    Social History:  reports that he has never smoked. He has never used smokeless tobacco. He reports that he drinks alcohol. He reports that he does not use drugs.  Allergies: No Known Allergies  Medications Prior to Admission  Medication Sig Dispense Refill  . acetaminophen (TYLENOL) 500 MG tablet Take 500-1,000 mg by mouth every 6 (six) hours as needed for headache (pain).    . Artificial Tear Ointment (DRY EYES OP) Apply 1 drop to eye daily as needed (for drye eyes).    [name] atorvastatin (LIPITOR) 40 MG tablet Take 1 tablet (40 mg total) by mouth daily. 90 tablet 3  . Multiple Vitamins-Minerals (OCUVITE EYE HEALTH FORMULA) CAPS Take 1 capsule by mouth daily.    . ondansetron (ZOFRAN) 4 MG tablet Take 4 mg by mouth 4 (four) times daily as needed for nausea or vomiting.    . sodium chloride (OCEAN) 0.65 % SOLN nasal spray Place 1 spray into both nostrils as needed for congestion.    . traMADol (ULTRAM) 50 MG tablet Take 1 tablet (50 mg total) by mouth every 6 (six) hours as needed. (Patient taking differently: Take 50 mg by mouth at bedtime as needed (pain). ) 30 tablet 1    Results for orders placed or performed during the hospital encounter of 08/25/18 (from the past 48  hour(s))  Comprehensive metabolic panel     Status: Abnormal   Collection Time: 08/25/18  4:58 PM  Result Value Ref Range   Sodium 127 (L) 135 - 145 mmol/L   Potassium 4.0 3.5 - 5.1 mmol/L   Chloride 96 (L) 98 - 111 mmol/L   CO2 22 22 - 32 mmol/L   Glucose, Bld 129 (H) 70 - 99 mg/dL   BUN 20 6 - 20 mg/dL   Creatinine, Ser 0.85 0.61 - 1.24 mg/dL   Calcium 8.6 (L) 8.9 - 10.3 mg/dL   Total Protein 6.2 (L) 6.5 - 8.1 g/dL   Albumin 3.2 (L) 3.5 - 5.0 g/dL   AST 19 15 - 41 U/L   ALT 21 0 -  44 U/L   Alkaline Phosphatase 45 38 - 126 U/L   Total Bilirubin 0.8 0.3 - 1.2 mg/dL   GFR calc non Af Amer >60 >60 mL/min   GFR calc Af Amer >60 >60 mL/min    Comment: (NOTE) The eGFR has been calculated using the CKD EPI equation. This calculation has not been validated in all clinical situations. eGFR's persistently <60 mL/min signify possible Chronic Kidney Disease.    Anion gap 9 5 - 15    Comment: Performed at [facility] [address], [place], [place] [address]  CBC with Differential     Status: Abnormal   Collection Time: 08/25/18  4:58 PM  Result Value Ref Range   WBC 12.7 (H) 4.0 - 10.5 K/uL   RBC 4.48 4.22 - 5.81 MIL/uL   Hemoglobin 14.2 13.0 - 17.0 g/dL   HCT 41.0 39.0 - 52.0 %   MCV 91.5 80.0 - 100.0 fL   MCH 31.7 26.0 - 34.0 pg   MCHC 34.6 30.0 - 36.0 g/dL   RDW 12.6 11.5 - 15.5 %   Platelets 191 150 - 400 K/uL   nRBC 0.0 0.0 - 0.2 %   Neutrophils Relative % 79 %   Neutro Abs 10.1 (H) 1.7 - 7.7 K/uL   Lymphocytes Relative 10 %   Lymphs Abs 1.2 0.7 - 4.0 K/uL   Monocytes Relative 10 %   Monocytes Absolute 1.2 (H) 0.1 - 1.0 K/uL   Eosinophils Relative 1 %   Eosinophils Absolute 0.1 0.0 - 0.5 K/uL   Basophils Relative 0 %   Basophils Absolute 0.0 0.0 - 0.1 K/uL   Immature Granulocytes 0 %   Abs Immature Granulocytes 0.03 0.00 - 0.07 K/uL    Comment: Performed at [facility] [address], [place], [place] [address]  I-Stat CG4 Lactic Acid, ED     Status: None   Collection Time: 08/25/18  5:04 PM  Result Value Ref Range   Lactic Acid, Venous 1.68 0.5 - 1.9 mmol/L  I-Stat CG4 Lactic Acid, ED     Status: None   Collection Time: 08/25/18  6:08 PM  Result Value Ref Range   Lactic Acid, Venous 1.90 0.5 - 1.9 mmol/L  Basic metabolic panel     Status: Abnormal   Collection Time: 08/25/18 11:20 PM  Result Value Ref Range   Sodium 128 (L) 135 - 145 mmol/L   Potassium 3.9 3.5 - 5.1 mmol/L   Chloride 96 (L) 98 - 111 mmol/L   CO2 22 22 - 32 mmol/L    Glucose, Bld 148 (H) 70 - 99 mg/dL   BUN 20 6 - 20 mg/dL   Creatinine, Ser 0.90 0.61 - 1.24 mg/dL   Calcium 8.4 (  L) 8.9 - 10.3 mg/dL   GFR calc non Af Amer >60 >60 mL/min   GFR calc Af Amer >60 >60 mL/min    Comment: (NOTE) The eGFR has been calculated using the CKD EPI equation. This calculation has not been validated in all clinical situations. eGFR's persistently <60 mL/min signify possible Chronic Kidney Disease.    Anion gap 10 5 - 15    Comment: Performed at [facility] [address] [place], [place] [address]  CBC     Status: Abnormal   Collection Time: 08/25/18 11:20 PM  Result Value Ref Range   WBC 14.2 (H) 4.0 - 10.5 K/uL   RBC 4.25 4.22 - 5.81 MIL/uL   Hemoglobin 13.4 13.0 - 17.0 g/dL   HCT 39.0 39.0 - 52.0 %   MCV 91.8 80.0 - 100.0 fL   MCH 31.5 26.0 - 34.0 pg   MCHC 34.4 30.0 - 36.0 g/dL   RDW 12.6 11.5 - 15.5 %   Platelets 116 (L) 150 - 400 K/uL    Comment: REPEATED TO VERIFY PLATELET COUNT CONFIRMED BY SMEAR SPECIMEN CHECKED FOR CLOTS Immature Platelet Fraction may be clinically indicated, consider ordering this additional test [id number]    nRBC 0.0 0.0 - 0.2 %    Comment: Performed at [facility], [place] [address], [place], [place] [address]  APTT     Status: Abnormal   Collection Time: 08/25/18 11:20 PM  Result Value Ref Range   aPTT 20 (L) 24 - 36 seconds    Comment: Performed at [facility] [address] [place], [place] [address]  Protime-INR     Status: None   Collection Time: 08/25/18 11:20 PM  Result Value Ref Range   Prothrombin Time 14.0 11.4 - 15.2 seconds   INR 1.08     Comment: Performed at [facility], [place] [address] [place], [place] [address]   Ct Head Wo Contrast  Result Date: 08/26/2018 CLINICAL DATA:  Hydrocephalus, noncommunicating EXAM: CT HEAD WITHOUT CONTRAST TECHNIQUE: Contiguous axial images were obtained from the base of the skull through the vertex without intravenous contrast. COMPARISON:   Yesterday FINDINGS: Brain: VP shunt with a ventriculostomy catheter taking unexpected course through the parenchyma of the left parietal and temporal lobes, tip at the left middle cranial fossa along the superficial temporal lobe. There is pneumocephalus attributed to catheter placement. Continued marked lateral and third ventriculomegaly consistent with hydrocephalus at the level of the cerebral aqua duct. There is transependymal CSF flow which appears similar to prior. No herniation or infarct seen. No acute hemorrhage. There is sequela of prior right parietal hemorrhage. Vascular: No hyperdense vessel or unexpected calcification. Skull: Gas and swelling related to recent shunt placement. Sinuses/Orbits: Negative 08/26/2018 at 4:30 pm. Dr. Annette Stable is currently operating. I relayed the message to his MA Annett Gula who is relaying the message to him in the OR currently. IMPRESSION: 1. VP shunt placement with unexpected course of the ventriculostomy catheter traversing the left cerebral parenchyma and not reaching the ventricular system. 2. Unchanged marked lateral and third ventriculomegaly above cerebral aqua duct obstruction, with transependymal CSF flow. Electronically Signed   By: Monte Fantasia M.D.   On: 08/26/2018 16:32   Ct Head Wo Contrast  Result Date: 08/25/2018 CLINICAL DATA:  Noncommunicating hydrocephalus. VP shunt revision, September, with subsequent infection. Recent EVD removal. Altered mental status. EXAM: CT HEAD WITHOUT CONTRAST TECHNIQUE: Contiguous axial images were obtained from the base of the skull through  the vertex without intravenous contrast. COMPARISON:  Multiple priors, most recent 08/22/2018. FINDINGS: Brain: Continued worsening hydrocephalus following EVD removal. Transverse diameter of the third ventricle measures 16 mm, as compared with 10 mm on 08/22/2018. Marked ballooning of the lateral ventricles, and temporal horns. Increasing transependymal absorption. Dystrophic  calcification along the tract of the posterior burr hole in the RIGHT parietal region Vascular: No hyperdense vessel or unexpected calcification. Skull: Calvarial defects related to previous placement of ventricular shunt catheters, but no concerning osseous abnormality. Sinuses/Orbits: Negative Other: None IMPRESSION: Marked worsening hydrocephalus, involving the lateral and third ventricles. In the setting of altered mental status, repeat EVD may be warranted. These results were called by telephone at the time of interpretation on 08/25/2018 at 6:54 pm to Dr. Jola Schmidt , who verbally acknowledged these results. Electronically Signed   By: Staci Righter M.D.   On: 08/25/2018 18:55    Pertinent items noted in HPI and remainder of comprehensive ROS otherwise negative.  Blood pressure (!) 142/94, pulse 67, temperature 98.6 F (37 C), temperature source Axillary, resp. rate 12, height '5\' 11"'  (1.803 m), weight 81.2 kg, SpO2 96 %.  Patient is awake and alert.  He is oriented but mildly confused.  Speech is fluent.  Judgment and insight are impaired somewhat.  Cranial nerve function normal bilateral.  Motor examination 5/5.  Gait unsteady.  Posture not tested.  Examination head ears eyes and throat demonstrates good healing of his shunt incisions.  Abdomen soft.  No evidence of infection.  Neck supple. Assessment/Plan Worsening communicate hydrocephalus.  Patient admitted for VP shunt placement tomorrow.  Once again I discussed the risks and benefits involved with shunt placement.  The patient and his family wish to proceed.  Phil Michels A Avonelle Viveros 08/27/2018, 8:22 AM

## 2018-08-27 NOTE — Evaluation (Signed)
Speech Language Pathology Evaluation Patient Details Name: Charles Marquez MRN: [id number] DOB: 1956/06/27 Today's Date: 08/27/2018 Time: 1200-1230 SLP Time Calculation (min) (ACUTE ONLY): 30 min  Problem List:  Patient Active Problem List   Diagnosis Date Noted  . Hyponatremia   . Hypokalemia   . 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 Bacterial meningitis 08/09/2018  . TIA (transient ischemic attack) 01/29/2017  . Acute encephalopathy   . Shunt malfunction 03/13/2016  . 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 Hyperlipidemia 10/20/2014  . Degenerative disc disease, lumbar 04/15/2013  . Routine general medical examination at a health care facility 07/23/2012  . DISTURBANCE OF SKIN SENSATION 10/05/2008  . HYPERLIPIDEMIA 01/01/2008  . MYCOPLASMA PNEUMONIA 01/01/2008   Past Medical History:  Past Medical History:  Diagnosis Date  . Anxiety   . Hypercholesteremia   . Stroke (HCC)    tia's  . TIA (transient ischemic attack)    09.15   Past Surgical History:  Past Surgical History:  Procedure Laterality Date  . Fractured arm Left 12  . HERNIA REPAIR Right 3/12  . LOOP RECORDER INSERTION N/A 04/10/2017   Procedure: Loop Recorder Insertion;  Surgeon: Hillis Range, MD;  [facility];  Service: Cardiovascular;  Laterality: N/A;  . SHUNT REMOVAL Right 03/13/2016   Procedure: SHUNT REMOVAL;  Surgeon: Julio Sicks, MD;  [facility];  Service: Neurosurgery;  Laterality: Right;  . SHUNT REMOVAL Right 08/09/2018   Procedure: SHUNT REMOVAL With Placement of Ventricular Catheter;  Surgeon: Lisbeth Renshaw, MD;  Location: [facility];  Service: Neurosurgery;  Laterality: Right;  . SHUNT REVISION Right 08/05/2018   Procedure: SHUNT REVISION;  Surgeon: Julio Sicks, MD;  Location: [facility] OR;  Service: Neurosurgery;  Laterality: Right;  [name] VASECTOMY  10/02/1997  .  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 VENTRICULOPERITONEAL SHUNT Right 07/22/2018   Procedure: Shunt Placment right occipital;  Surgeon: Julio Sicks, MD;  Location: [facility];  Service: Neurosurgery;  Laterality: Right;  . VENTRICULOSTOMY Right 08/09/2018   Procedure: VENTRICULOSTOMY;  Surgeon: Lisbeth Renshaw, MD;  Location: [facility] OR;  Service: Neurosurgery;  Laterality: Right;   HPI:  62 year old male admitted 08/25/18 with gait disturbance and AMS. 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 Now referred for BSE and SLE.   Assessment / Plan / Recommendation Clinical Impression  Assessment of receptive and expressive language was completed. Pt is significantly globally aphasic, with deficits of both auditory comprehension and verbal expression. Some social exchanges are intact (thank you, ok, alright), however, pt is unable to answer simple yes/no questions or follow one-step commands correctly, even given max verbal, visual, or tactile cues. Expressively, pt demonstrates fluent utterances, largely unintelligible due to rapid speech rate and neologistic paraphasias. Pt was unable to produce automatic sequences, repeat words, complete sentences, or name objects accurately. Responses were sentence length and nonsensical for the most part, aside from occasional social exchanges.   Continued ST intervention is recommended to maximize receptive and expressive language skills for effective communication. Family was encouraged to keep questions and directions simple, and utilize gestures when possible. ST will continue to follow acutely, and  will assess reading comprehension and written expression. Need for  continued ST intervention after acute care stay is anticipated.    SLP Assessment  SLP Recommendation/Assessment: Patient needs continued Speech Language Pathology Services SLP Visit Diagnosis: Aphasia (R47.01)    Follow Up Recommendations  (continued ST intervention is recommended after acute care stay)    Frequency and Duration min 2x/week  2 weeks      SLP Evaluation Cognition  Overall Cognitive Status: (unable to assess cognition due to global aphasia) Arousal/Alertness: Awake/alert Orientation Level: Other (comment)(aphasic)       Comprehension  Auditory Comprehension Overall Auditory Comprehension: Impaired Yes/No Questions: Impaired Basic Biographical Questions: 0-25% accurate Basic Immediate Environment Questions: 0-24% accurate Commands: Impaired One Step Basic Commands: 0-24% accurate Conversation: Simple EffectiveTechniques: Visual/Gestural cues;Repetition;Extra processing time Visual Recognition/Discrimination Discrimination: Not tested Reading Comprehension Reading Status: Not tested    Expression Expression Primary Mode of Expression: Verbal Verbal Expression Overall Verbal Expression: Impaired Initiation: No impairment Automatic Speech: (unable to demonstrate) Level of Generative/Spontaneous Verbalization: Sentence Repetition: Impaired Level of Impairment: Word level Naming: Impairment Responsive: 0-25% accurate Confrontation: Impaired Convergent: 0-24% accurate Divergent: 0-24% accurate Verbal Errors: Neologisms;Jargon;Not aware of errors Pragmatics: No impairment Written Expression Dominant Hand: Right Written Expression: Not tested   Oral / Motor  Oral Motor/Sensory Function Overall Oral Motor/Sensory Function: Within functional limits Motor Speech Overall Motor Speech: Impaired Respiration: Within functional limits Resonance: Within functional limits Articulation: Impaired Level of Impairment: Sentence Intelligibility: Intelligibility  reduced(pt fluently aphasic, speaks at a very rapid rate) Word: 75-100% accurate Phrase: 75-100% accurate Sentence: 25-49% accurate Conversation: 0-24% accurate Motor Planning: Not tested Motor Speech Errors: Unaware   GO                   Banner Huckaba B. Murvin [facility], CCC-SLP Speech Language Pathologist [phone]  Leigh [place] 08/27/2018, 1:01 PM

## 2018-08-27 NOTE — Evaluation (Signed)
Physical Therapy Evaluation Patient Details Name: Charles Marquez MRN: [id number] DOB: 07/20/1956 Today's Date: 08/27/2018   History of Present Illness  62 y.o. male admitted on 08/25/18 for increased confusion and gait instability. 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 He was recently d/c home from [facility] on 08/22/18.    Clinical Impression  PT is familiar with this pt from his recent previous admission.  He has significant changes from his recent baseline in cognition, strength, balance and mobility.  He was walking without an assistive device when he first d/c home with his wife from [place] on 08/22/18 and he currently requires two person mod assist due to R inattention, right sided weakness and right lateral lean.  He is globally aphasic requiring multimodal cues to understand what PT wants of him.  His family is extremely supportive.   PT to follow acutely for deficits listed below.    Follow Up Recommendations CIR    Equipment Recommendations  Rolling walker with 5" wheels    Recommendations for Other Services Rehab consult     Precautions / Restrictions Precautions Precautions: Fall Precaution Comments: right lateral lean, R LE weakness      Mobility  Bed Mobility Overal bed mobility: Needs Assistance Bed Mobility: Supine to Sit     Supine to sit: Mod assist;HOB elevated     General bed mobility comments: Mod assist to help support trunk and initiate movement of legs to side of bed.   Transfers Overall transfer level: Needs assistance Equipment used: 2 person hand held assist Transfers: Sit to/from [employer] Sit to Stand: Mod assist;+2 physical assistance Stand pivot transfers: +2 physical assistance;Mod assist       General transfer comment: Two person mod assist to stand to [facility] and stand multiple times from [facility] switching  out the [facility] pulling up the recliner chair once he tried to have a BM.    Ambulation/Gait             General Gait Details: NT today as significant time was spent trying to get to [facility] and into recliner.  Will likely need to start with two person hand held assist vs two people and the RW.         Balance Overall balance assessment: Needs assistance Sitting-balance support: Feet supported;Single extremity supported;No upper extremity supported;Bilateral upper extremity supported Sitting balance-Leahy Scale: Poor Sitting balance - Comments: Mod assist EOB with right lateral lean, did well with body on body on both side of him as he would lean over to the left towards his right and therapist could facilitate from the right side at his scapula and trunk to get to upright sitting posture.   Postural control: Right lateral lean Standing balance support: Bilateral upper extremity supported Standing balance-Leahy Scale: Poor Standing balance comment: mod two person hand held assist in standing.  Interestingly, less right lateral lean in standing than in sitting.                              Pertinent Vitals/Pain Pain Assessment: Faces Faces Pain Scale: No hurt Pain Descriptors / Indicators: Restless Pain Intervention(s): Monitored during session;Repositioned    Home Living Family/patient expects to be discharged to:: Private residence Living Arrangements: Spouse/significant other Available Help at Discharge: Family;Available 24 hours/day Type of Home: House Home Access: Stairs to enter Entrance Stairs-Rails: Can  reach both Entrance Stairs-Number of Steps: 5 Home Layout: Two level Home Equipment: None      Prior Function Level of Independence: Independent         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 went home with wife at discharge 4 days PTA and was ambulating without an AD.      Hand Dominance   Dominant Hand: Right     Extremity/Trunk Assessment   Upper Extremity Assessment Upper Extremity Assessment: RUE deficits/detail;Defer to OT evaluation RUE Deficits / Details: signs of right inattention vs neglect    Lower Extremity Assessment Lower Extremity Assessment: RLE deficits/detail RLE Deficits / Details: right leg weakner compared to his left, grossly 3-/5 per seated kicks and gross functional assessment.      Cervical / Trunk Assessment Cervical / Trunk Assessment: Other exceptions Cervical / Trunk Exceptions: right lateral lean  Communication   Communication: Expressive difficulties;Receptive difficulties  Cognition Arousal/Alertness: Awake/alert Behavior During Therapy: Restless Overall Cognitive Status: Impaired/Different from baseline Area of Impairment: Orientation;Attention;Memory;Following commands;Safety/judgement;Awareness;Problem solving                 Orientation Level: Disoriented to;Place;Time;Situation Current Attention Level: Focused Memory: Decreased recall of precautions;Decreased short-term memory Following Commands: Follows one step commands with increased time;Follows one step commands inconsistently(25% of commands) Safety/Judgement: Decreased awareness of safety;Decreased awareness of deficits Awareness: Intellectual Problem Solving: Slow processing;Decreased initiation;Difficulty sequencing;Requires verbal cues;Requires tactile cues General Comments: Pt with significant decline in cognition compared to his previous admission, difficulty with processing, initiation, global aphasia, impulsive and restless.      General Comments General comments (skin integrity, edema, etc.): worked in sitting on finding midline, following one step commands, singing to familiar songs.  Durin his last admission, he initially had double vision.  Although tough to formally assess, I don't see any obvious signs of double vision at this time.         Assessment/Plan    PT Assessment  Patient needs continued PT services  PT Problem List Decreased mobility;Decreased safety awareness;Decreased coordination;Decreased balance       PT Treatment Interventions DME instruction;Gait training;Stair training;Functional mobility training;Therapeutic activities;Therapeutic exercise;Balance training;Neuromuscular re-education;Cognitive remediation;Patient/family education    PT Goals (Current goals can be found in the Care Plan section)  Acute Rehab PT Goals Patient Stated Goal: to get back to normal, and stop coming to the hospital PT Goal Formulation: With patient/family Time For Goal Achievement: 09/10/18 Potential to Achieve Goals: Good    Frequency Min 3X/week           AM-PAC PT "6 Clicks" Daily Activity  Outcome Measure Difficulty turning over in bed (including adjusting bedclothes, sheets and blankets)?: Unable Difficulty moving from lying on back to sitting on the side of the bed? : Unable Difficulty sitting down on and standing up from a chair with arms (e.g., wheelchair, bedside commode, etc,.)?: Unable Help needed moving to and from a bed to chair (including a wheelchair)?: A Lot Help needed walking in hospital room?: A Lot Help needed climbing 3-5 steps with a railing? : Total 6 Click Score: 8    End of Session Equipment Utilized During Treatment: Gait belt Activity Tolerance: Patient tolerated treatment well Patient left: in chair;with call bell/phone within reach;with family/visitor present Nurse Communication: Mobility status PT Visit Diagnosis: Unsteadiness on feet (R26.81);Difficulty in walking, not elsewhere classified (R26.2);Other symptoms and signs involving the nervous system (Z61.096)    Time: 0454-0981 PT Time Calculation (min) (ACUTE ONLY): 45 min  Charges:            Rollene Rotunda. Latania Bascomb, PT, DPT  Acute Rehabilitation [phone] pager #[phone] office   PT Evaluation $PT Eval Moderate Complexity: 1 Mod PT  Treatments $Therapeutic Activity: 8-22 mins $Neuromuscular Re-education: 8-22 mins        08/27/2018, 4:38 PM

## 2018-08-27 NOTE — Anesthesia Postprocedure Evaluation (Signed)
Anesthesia Post Note  Patient: Charles Marquez  Procedure(s) Performed: LEFT SIDED VENTRICULAR-PERITONEAL SHUNT (Left Head) LAPAROSCOPIC INSERTION VENTRICULAR-PERITONEAL (V-P) SHUNT (N/A Abdomen)     Patient location during evaluation: PACU Anesthesia Type: General Level of consciousness: awake and alert Pain management: pain level controlled Vital Signs Assessment: post-procedure vital signs reviewed and stable Respiratory status: spontaneous breathing, nonlabored ventilation and respiratory function stable Cardiovascular status: blood pressure returned to baseline and stable Postop Assessment: no apparent nausea or vomiting Anesthetic complications: no    Last Vitals:  Vitals:   08/27/18 0500 08/27/18 0600  BP: (!) 141/95 (!) 147/95  Pulse: 63 62  Resp:  16  Temp:    SpO2: 98% 98%    Last Pain:  Vitals:   08/27/18 0400  TempSrc: Axillary  PainSc:                  Thaddius Manes,W. EDMOND

## 2018-08-27 NOTE — Progress Notes (Signed)
1 Day Post-Op      Subjective: He is awake and alert, going for repeat CT.  Issues with swallowing so he is NPO.  Sites all look fine from our standpoint.   Objective: Vital signs in last 24 hours: Temp:  [96.6 F (35.9 C)-98.9 F (37.2 C)] 98.2 F (36.8 C) (10/15 0800) Pulse Rate:  [61-112] 67 (10/15 0800) Resp:  [10-22] 12 (10/15 0800) BP: (124-152)/(80-116) 142/94 (10/15 0800) SpO2:  [82 %-100 %] 96 % (10/15 0800) Weight:  [81.2 kg] 81.2 kg (10/14 1329) Last BM Date: 08/25/18  Intake/Output from previous day: 10/14 0701 - 10/15 0700 In: 2045.6 [I.V.:1645.7; IV Piggyback:399.9] Out: 2240 [Urine:2225; Blood:15] Intake/Output this shift: Total I/O In: 74.9 [I.V.:74.9] Out: -   General appearance: alert, cooperative and no distress GI: soft, minimally sore, sites OK  Lab Results:  Recent Labs    08/25/18 1658 08/25/18 2320  WBC 12.7* 14.2*  HGB 14.2 13.4  HCT 41.0 39.0  PLT 191 116*    BMET Recent Labs    08/25/18 1658 08/25/18 2320  NA 127* 128*  K 4.0 3.9  CL 96* 96*  CO2 22 22  GLUCOSE 129* 148*  BUN 20 20  CREATININE 0.85 0.90  CALCIUM 8.6* 8.4*   PT/INR Recent Labs    08/25/18 2320  LABPROT 14.0  INR 1.08    Recent Labs  Lab 08/25/18 1658  AST 19  ALT 21  ALKPHOS 45  BILITOT 0.8  PROT 6.2*  ALBUMIN 3.2*     Lipase  No results found for: LIPASE   Prior to Admission medications   Medication Sig Start Date End Date Taking? Authorizing Provider  acetaminophen (TYLENOL) 500 MG tablet Take 500-1,000 mg by mouth every 6 (six) hours as needed for headache (pain).   Yes [provider]  Artificial Tear Ointment (DRY EYES OP) Apply 1 drop to eye daily as needed (for drye eyes).   Yes [provider]  atorvastatin (LIPITOR) 40 MG tablet Take 1 tablet (40 mg total) by mouth daily. 11/19/14  Yes Roderick Pee, MD  Multiple Vitamins-Minerals [facility] EYE HEALTH FORMULA) CAPS Take 1 capsule by mouth daily.   Yes [provider]  ondansetron (ZOFRAN) 4 MG tablet Take 4 mg by mouth 4 (four) times daily as needed for nausea or vomiting.   Yes [provider]  sodium chloride (OCEAN) 0.65 % SOLN nasal spray Place 1 spray into both nostrils as needed for congestion.   Yes [provider]  traMADol (ULTRAM) 50 MG tablet Take 1 tablet (50 mg total) by mouth every 6 (six) hours as needed. Patient taking differently: Take 50 mg by mouth at bedtime as needed (pain).  07/23/18  Yes Pool, Sherilyn Cooter, MD    Medications: . chlorhexidine  15 mL Mouth Rinse BID  . mouth rinse  15 mL Mouth Rinse q12n4p   . sodium chloride 75 mL/hr at 08/27/18 0800  . cefTRIAXone (ROCEPHIN)  IV Stopped (08/26/18 2223)  . famotidine (PEPCID) IV 20 mg (08/27/18 0842)  . vancomycin    . vancomycin     Anti-infectives (From admission, onward)   Start     Dose/Rate Route Frequency Ordered Stop   08/27/18 1730  vancomycin (VANCOCIN) IVPB 1000 mg/200 mL premix     1,000 mg 200 mL/hr over 60 Minutes Intravenous Every 8 hours 08/27/18 0906     08/27/18 0930  vancomycin (VANCOCIN) 1,500 mg in sodium chloride 0.9 % 500 mL IVPB  1,500 mg 250 mL/hr over 120 Minutes Intravenous  Once 08/27/18 0906     08/26/18 2200  cefTRIAXone (ROCEPHIN) 2 g in sodium chloride 0.9 % 100 mL IVPB     2 g 200 mL/hr over 30 Minutes Intravenous Every 12 hours 08/26/18 1839     08/26/18 1732  bacitracin 50,000 Units in sodium chloride 0.9 % 500 mL irrigation  Status:  Discontinued       As needed 08/26/18 1732 08/26/18 1751   08/26/18 1545  cefTRIAXone (ROCEPHIN) 1 g in sodium chloride 0.9 % 100 mL IVPB  Status:  Discontinued     1 g 200 mL/hr over 30 Minutes Intravenous Every 12 hours 08/26/18 1539 08/26/18 1839   08/26/18 1430  piperacillin-tazobactam (ZOSYN) IVPB 3.375 g     3.375 g 100 mL/hr over 30 Minutes Intravenous To Surgery 08/26/18 1416 08/26/18 1457   08/26/18 1356  bacitracin 50,000 Units in sodium chloride 0.9 % 500 mL irrigation   Status:  Discontinued       As needed 08/26/18 1356 08/26/18 1519      Assessment/Plan Hx CVA/TIA Na 128   Hydrocephalus with VP shunt malfunction Revision of left ventricular - peritoneal shunt, 08/25/18, Dr. Julio Sicks, and Dr. Axel Filler  FEN:  IV fluids/NPO ID:  Vancomycin 10/15 Day 1;  Rocephin 10/14 day 2;  DVT:  SCD's Follow up;  Dr. Jordan Likes  Plan:  When swallowing issue resolved you can start clears and advance as tolerated.  Call if we can help.       LOS: 2 days    Charles Marquez 08/27/2018 [phone]

## 2018-08-27 NOTE — Progress Notes (Addendum)
  Postop day 1.  No events or problems overnight.  Patient wide awake and alert.  Evidence of mixed aphasia but patient is improved from where he was last night.  He is now able to say a few words.  He follows commands bilaterally.  His right-sided weakness is also improving.  He is having no fever.  Status post left-sided VP shunt placement with significant right-sided weakness and language difficulties postop.  Plan follow-up head CT scan today.

## 2018-08-27 NOTE — Anesthesia Postprocedure Evaluation (Signed)
Anesthesia Post Note  Patient: Charles Marquez  Procedure(s) Performed: SHUNT REVISION VENTRICULAR-PERITONEAL (Left Head)     Patient location during evaluation: PACU Anesthesia Type: General Level of consciousness: awake and alert Pain management: pain level controlled Vital Signs Assessment: post-procedure vital signs reviewed and stable Respiratory status: spontaneous breathing, nonlabored ventilation, respiratory function stable and patient connected to nasal cannula oxygen Cardiovascular status: blood pressure returned to baseline and stable Postop Assessment: no apparent nausea or vomiting Anesthetic complications: no    Last Vitals:  Vitals:   08/27/18 0600 08/27/18 0700  BP: (!) 147/95 (!) 144/91  Pulse: 62 61  Resp: 16 13  Temp:    SpO2: 98% 98%    Last Pain:  Vitals:   08/27/18 0400  TempSrc: Axillary  PainSc:                  Kennieth Rad

## 2018-08-27 NOTE — Evaluation (Signed)
Clinical/Bedside Swallow Evaluation Patient Details  Name: Charles Marquez MRN: [id number] Date of Birth: 05-10-1956  Today's Date: 08/27/2018 Time: SLP Start Time (ACUTE ONLY): 1130 SLP Stop Time (ACUTE ONLY): 1200 SLP Time Calculation (min) (ACUTE ONLY): 30 min  Past Medical History:  Past Medical History:  Diagnosis Date  . Anxiety   . Hypercholesteremia   . Stroke (HCC)    tia's  . TIA (transient ischemic attack)    09.15   Past Surgical History:  Past Surgical History:  Procedure Laterality Date  . Fractured arm Left 12  . HERNIA REPAIR Right 3/12  . LOOP RECORDER INSERTION N/A 04/10/2017   Procedure: Loop Recorder Insertion;  Surgeon: Hillis Range, MD;  Location: [facility] INVASIVE CV LAB;  Service: Cardiovascular;  Laterality: N/A;  . SHUNT REMOVAL Right 03/13/2016   Procedure: SHUNT REMOVAL;  Surgeon: Julio Sicks, MD;  Location: [facility] NEURO ORS;  Service: Neurosurgery;  Laterality: Right;  . SHUNT REMOVAL Right 08/09/2018   Procedure: SHUNT REMOVAL With Placement of Ventricular Catheter;  Surgeon: Lisbeth Renshaw, MD;  [facility] OR;  Service: Neurosurgery;  Laterality: Right;  . SHUNT REVISION Right 08/05/2018   Procedure: SHUNT REVISION;  Surgeon: Julio Sicks, MD;  Location: [facility] OR;  Service: Neurosurgery;  Laterality: Right;  [name] VASECTOMY  10/02/1997  . 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 VENTRICULOPERITONEAL SHUNT Right 07/22/2018   Procedure: Shunt Placment right occipital;  Surgeon: Julio Sicks, MD;  Location: [facility] OR;  Service: Neurosurgery;  Laterality: Right;  . VENTRICULOSTOMY Right 08/09/2018   Procedure: VENTRICULOSTOMY;  Surgeon: Lisbeth Renshaw, MD;  Location[facility];  Service: Neurosurgery;  Laterality: Right;   HPI:  62 year old male admitted 08/25/18 with gait  disturbance and AMS. 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 Now referred for BSE and SLE.   Assessment / Plan / Recommendation Clinical Impression  Oral care was completed with suction. Pt has natural dentition, and tolerated a regular diet and thin liquids prior to admit. Pt was given trials of thin liquid, puree, and solid consistencies. Pt passed the 3 oz water challenge without difficulty or s/s aspiration. Pt requires significant assistance with self feeding, due in part to right neglect. Wife reports pt also has a history of diplopia, and appeared to have difficulty with depth perception when trying to take the spoon from the applesauce. Will begin regular diet and thin liquids.   Pt needs 1:1 assistance during meals for help with self feeding. SLP will follow for diet tolerance and educaiton. Safe swallow precautions posted at [facility] and reviewed with family. RN and MD informed of results and recommendations.     SLP Visit Diagnosis: Dysphagia, unspecified (R13.10)    Aspiration Risk  Mild aspiration risk    Diet Recommendation Thin liquid;Regular   Liquid Administration via: Cup;Straw Medication Administration: Whole meds with liquid Supervision: Patient able to self feed;Staff to assist with self feeding;Full supervision/cueing for compensatory strategies Compensations: Minimize environmental distractions;Slow rate;Small sips/bites Postural Changes: Seated upright at 90 degrees;Remain upright for at least 30 minutes after po intake    Other  Recommendations Oral Care Recommendations: Oral care BID   Follow up Recommendations (continued ST intervention is recommended after acute care stay)  Frequency and Duration min 2x/week  2 weeks       Prognosis Prognosis for Safe Diet Advancement: Good      Swallow Study   General Date of Onset: 08/25/18 HPI: 62 year old  male admitted 08/25/18 with gait disturbance and AMS. 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 Now referred for BSE and SLE. Type of Study: Bedside Swallow Evaluation Previous Swallow Assessment: none Diet Prior to this Study: NPO Temperature Spikes Noted: No Respiratory Status: Room air Behavior/Cognition: Alert;Cooperative Oral Cavity Assessment: Within Functional Limits Oral Care Completed by SLP: Yes Oral Cavity - Dentition: Adequate natural dentition Vision: Functional for self-feeding(requires assistance) Self-Feeding Abilities: Needs assist;Able to feed self Patient Positioning: Upright in bed Baseline Vocal Quality: Low vocal intensity Volitional Cough: Cognitively unable to elicit Volitional Swallow: Unable to elicit    Oral/Motor/Sensory Function Overall Oral Motor/Sensory Function: Within functional limits   Ice Chips Ice chips: Within functional limits Presentation: Spoon   Thin Liquid Thin Liquid: Within functional limits Presentation: Cup;Straw;Self Fed    Nectar Thick Nectar Thick Liquid: Not tested   Honey Thick Honey Thick Liquid: Not tested   Puree Puree: Within functional limits Presentation: Spoon   Solid     Solid: Within functional limits Presentation: Self Fed(with assist)     Nekisha Mcdiarmid B. Murvin Natal, [facility], CCC-SLP Speech Language Pathologist [phone]  Leigh Aurora 08/27/2018,12:45 PM

## 2018-08-28 ENCOUNTER — Other Ambulatory Visit: Payer: Self-pay

## 2018-08-28 DIAGNOSIS — E871 Hypo-osmolality and hyponatremia: Secondary | ICD-10-CM

## 2018-08-28 DIAGNOSIS — E785 Hyperlipidemia, unspecified: Secondary | ICD-10-CM

## 2018-08-28 DIAGNOSIS — R000 Tachycardia, unspecified: Secondary | ICD-10-CM

## 2018-08-28 DIAGNOSIS — I10 Essential (primary) hypertension: Secondary | ICD-10-CM

## 2018-08-28 DIAGNOSIS — G910 Communicating hydrocephalus: Principal | ICD-10-CM

## 2018-08-28 DIAGNOSIS — Z8673 Personal history of transient ischemic attack (TIA), and cerebral infarction without residual deficits: Secondary | ICD-10-CM

## 2018-08-28 DIAGNOSIS — D72829 Elevated white blood cell count, unspecified: Secondary | ICD-10-CM

## 2018-08-28 MED ORDER — MAGIC MOUTHWASH
5.0000 mL | Freq: Four times a day (QID) | ORAL | Status: DC | PRN
  Administered 2018-08-28: 5 mL via ORAL
  Filled 2018-08-28 (×2): qty 5

## 2018-08-28 MED ORDER — POLYETHYLENE GLYCOL 3350 17 G PO PACK
17.0000 g | PACK | Freq: Every day | ORAL | Status: DC
  Administered 2018-08-28 – 2018-08-30 (×3): 17 g via ORAL
  Filled 2018-08-28 (×3): qty 1

## 2018-08-28 MED ORDER — BISACODYL 10 MG RE SUPP: 10.0000 mg | Freq: Every day | RECTAL | Status: DC | PRN

## 2018-08-28 NOTE — Progress Notes (Signed)
Overall remarkably improved.  Patient bright and alert.  His speech is still somewhat halting but is content is good.  His right-sided weakness is almost completely resolved.  He has a minimal right-sided pronator drift.  His motor control is much improved.  His tremor is much improved.  He has not stood or ambulated yet.  Plan for further mobilization with therapy today.  He is afebrile.  His wounds are healing well.  Abdomen soft.  Overall making very good progress.  We will see how he does mobilizing today.  At this point I think he is probably going to be most appropriate for home discharge.  I will hold on the rehab consult for now.

## 2018-08-28 NOTE — Progress Notes (Signed)
Inpatient Rehabilitation  Per PT request, patient was screened by Fae Pippin for appropriateness for an Inpatient Acute Rehab consult.  At this time note plans for surgery today.  Plan to follow along for post-op theray needs and recommendations.  Call if questions.     Charlane Ferretti., CCC/SLP Admission Coordinator  [facility]  Cell [phone]

## 2018-08-28 NOTE — Evaluation (Signed)
Occupational Therapy Evaluation Patient Details Name: Charles Marquez MRN: [id number] DOB: [birth year] Today's Date: 08/28/2018    History of Present Illness 62 y.o. male admitted on 08/25/18 for increased confusion and gait instability. 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 He was recently d/c home from [facility] on 08/22/18.     Clinical Impression   Pt recently dc'd home from [facility] and re-admitted due to worsening symptoms.  Patient admitted for above, and presents with global aphasia, impaired cognition (sequencing, problem solving, attention, safety), R inattention, impaired vision (difficult to assess), R lateral and posterior lean during mobility, impaired balance, and decreased activity tolerance.  Patient requires +2 mod assist for toilet transfers, total assist +2 for toileting, and min assist for grooming standing at sink with +2 mod assist to maintain balance.  Patient will benefit from continued OT services while admitted in order to optimize independence with ADLs/mobility, and believe he will best benefit from short stay CIR level rehab at dc.  Will continue to follow.     Follow Up Recommendations  Supervision/Assistance - 24 hour;CIR    Equipment Recommendations  3 in 1 bedside commode    Recommendations for Other Services Rehab consult     Precautions / Restrictions Precautions Precautions: Fall Precaution Comments: right lateral lean, R LE weakness, R inattention Restrictions Weight Bearing Restrictions: No      Mobility Bed Mobility Overal bed mobility: Needs Assistance Bed Mobility: Supine to Sit     Supine to sit: Min assist;HOB elevated     General bed mobility comments: Min assist to support trunk and help initiate movement to the right side of the bed today.   Transfers Overall transfer level: Needs assistance Equipment used:  Rolling walker (2 wheeled) Transfers: Sit to/from Stand Sit to Stand: Mod assist;+2 physical assistance         General transfer comment: Mod assist +2 to come to stand initially without an AD, then again with RW.  Assist needed to support trunk due to posterior and right lateral bias which increased with fatigue and repetition.  Manual and verbal cues for safe hand placement during transitions.     Balance Overall balance assessment: Needs assistance Sitting-balance support: Feet supported;Bilateral upper extremity supported;No upper extremity supported;Single extremity supported Sitting balance-Leahy Scale: Poor Sitting balance - Comments: min assist in sitting with posterior lean, slight R lean  Postural control: Posterior lean;Right lateral lean Standing balance support: Bilateral upper extremity supported;During functional activity Standing balance-Leahy Scale: Poor Standing balance comment: +2 mod assist during ADL activiites at sink, R knee block required and multimodal cueing for trunk                           ADL either performed or assessed with clinical judgement   ADL Overall ADL's : Needs assistance/impaired Eating/Feeding: Set up   Grooming: Minimal assistance;Standing Grooming Details (indicate cue type and reason): +2 modA to maintain balance in standing, min assist for oral care Upper Body Bathing: Minimal assistance;Sitting   Lower Body Bathing: Moderate assistance;Sit to/from stand;+2 for physical assistance   Upper Body Dressing : Minimal assistance;Sitting   Lower Body Dressing: Moderate assistance;Sit to/from stand;+2 for physical assistance;+2 for safety/equipment   Toilet Transfer: Moderate assistance;+2 for physical assistance;+2 for safety/equipment;Ambulation;RW   Toileting- Clothing Manipulation and Hygiene: Total assistance;+2 for physical assistance;Sit to/from stand  Functional mobility during ADLs: Moderate assistance;+2 for  physical assistance;+2 for safety/equipment;Rolling walker General ADL Comments: pt completing func mobility into restroom, requires +2 assist for R lateral lean and R inattention     Vision Baseline Vision/History: Wears glasses Wears Glasses: At all times Patient Visual Report: Other (comment)(pt denies visual changes ) Vision Assessment?: Vision impaired- to be further tested in functional context Additional Comments: impaired vision, difficult to assess with L gaze preference; able to gaze to R with max cueing      Perception     Praxis      Pertinent Vitals/Pain Pain Assessment: Faces Pain Score: 0-No pain     Hand Dominance Right   Extremity/Trunk Assessment Upper Extremity Assessment Upper Extremity Assessment: RUE deficits/detail RUE Deficits / Details: functional use but clumsy, R inattention; only tested functionally  RUE Coordination: decreased fine motor;decreased gross motor   Lower Extremity Assessment Lower Extremity Assessment: Defer to PT evaluation   Cervical / Trunk Assessment Cervical / Trunk Assessment: Other exceptions Cervical / Trunk Exceptions: right lateral lean   Communication Communication Communication: Expressive difficulties;Receptive difficulties   Cognition Arousal/Alertness: Awake/alert Behavior During Therapy: WFL for tasks assessed/performed Overall Cognitive Status: Impaired/Different from baseline Area of Impairment: Attention;Memory;Following commands;Safety/judgement;Awareness;Problem solving                   Current Attention Level: Sustained Memory: Decreased recall of precautions Following Commands: Follows one step commands inconsistently Safety/Judgement: Decreased awareness of safety;Decreased awareness of deficits Awareness: Intellectual Problem Solving: Difficulty sequencing;Requires verbal cues;Requires tactile cues General Comments: Pt continues to have decreased awareness and safety, language remains  automatic.   General Comments  HR 100-145 during ADLs, VSS otherwise and pt asymptomatic; RN made aware-- BP 133/91 initally, 119/86 at completion of session    Exercises     Shoulder Instructions      Home Living Family/patient expects to be discharged to:: Private residence Living Arrangements: Spouse/significant other Available Help at Discharge: Family;Available 24 hours/day Type of Home: House Home Access: Stairs to enter [employer] of Steps: 5 Entrance Stairs-Rails: Can reach both Home Layout: Two level Alternate Level Stairs-Number of Steps: flight  Alternate Level Stairs-Rails: Left Bathroom Shower/Tub: Tub/shower unit;Walk-in shower   Bathroom Toilet: Standard Bathroom Accessibility: Yes   Home Equipment: None          Prior Functioning/Environment Level of Independence: Independent        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 went home with wife at discharge 4 days PTA and was ambulating without an AD.         OT Problem List: Decreased strength;Decreased activity tolerance;Impaired balance (sitting and/or standing);Impaired vision/perception;Decreased coordination;Decreased cognition;Decreased safety awareness;Decreased knowledge of use of DME or AE      OT Treatment/Interventions: Self-care/ADL training;Therapeutic exercise;Neuromuscular education;DME and/or AE instruction;Therapeutic activities;Cognitive remediation/compensation;Visual/perceptual remediation/compensation;Patient/family education;Balance training    OT Goals(Current goals can be found in the care plan section) Acute Rehab OT Goals Patient Stated Goal: to get back to normal, and stop coming to the hospital OT Goal Formulation: With patient/family Time For Goal Achievement: 09/09/18 Potential to Achieve Goals: Good  OT Frequency: Min 2X/week   Barriers to D/C:            Co-evaluation PT/OT/SLP Co-Evaluation/Treatment: Yes Reason for  Co-Treatment: Complexity of the patient's impairments (multi-system involvement);For patient/therapist safety;To address functional/ADL transfers PT goals addressed during session: Mobility/safety with mobility;Balance;Proper use of DME;Strengthening/ROM OT goals addressed during session: ADL's and self-care  AM-PAC PT "6 Clicks" Daily Activity     Outcome Measure Help from another person eating meals?: None Help from another person taking care of personal grooming?: A Little Help from another person toileting, which includes using toliet, bedpan, or urinal?: Total Help from another person bathing (including washing, rinsing, drying)?: A Lot Help from another person to put on and taking off regular upper body clothing?: A Little Help from another person to put on and taking off regular lower body clothing?: A Lot 6 Click Score: 15   End of Session Equipment Utilized During Treatment: Gait belt Nurse Communication: Mobility status  Activity Tolerance: Patient tolerated treatment well Patient left: in chair;with call bell/phone within reach;with family/visitor present  OT Visit Diagnosis: Other abnormalities of gait and mobility (R26.89);Other symptoms and signs involving cognitive function;Other symptoms and signs involving the nervous system (R29.898)                Time: 1030-1102 OT Time Calculation (min): 32 min Charges:  OT General Charges $OT Visit: 1 Visit OT Evaluation $OT Eval Moderate Complexity: 1 Mod  Chancy Milroy, OT Acute Rehabilitation Services Pager [phone] Office [phone]   Chancy Milroy 08/28/2018, 1:45 PM

## 2018-08-28 NOTE — Consult Note (Signed)
Physical Medicine and Rehabilitation Consult Reason for Consult: Decreased functional mobility Referring Physician: Dr. Jordan Likes   HPI: Charles Marquez is a 62 y.o. right-handed male with history of hyperlipidemia, 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  Per chart review and wife, patient lives with spouse.  Works in Consulting civil engineer for [employer].  Patient ambulating without assistive device after recent discharge.  2 level home 5 steps to entry.  Patient with recent admission 08/04/2018 for unsteadiness of gait with nausea vomiting findings of malfunction of VP shunt with revision completed 08/05/2018 and discharged to home.  He was readmitted 08/08/2018 to 08/22/2018 for VP shunt infection as well as decrease in functional mobility, right side weakness and new onset of aphasia.  Organism isolated was Klebsiella.  He again underwent removal of VP shunt with placement of external ventriculostomy and completed a 2-week course of IV antibiotics.  Follow-up scan reviewed, showing right hydrocephalus. Per report, mildly enlarged right lateral ventricle but left ventricle and third ventricle and fourth ventricle small.  He was again readmitted 08/26/2018 for shunt revision ventricular peritoneal 08/26/2018 per Dr. Jordan Likes.  Physical therapy evaluation completed 08/27/2018 with recommendations of physical medicine rehab consult.  Review of Systems  Unable to perform ROS: Language   Past Medical History:  Diagnosis Date  . Anxiety   . Hypercholesteremia   . Stroke (HCC)    tia's  . TIA (transient ischemic attack)    09.15   Past Surgical History:  Procedure Laterality Date  . Fractured arm Left 12  . HERNIA REPAIR Right 3/12  . LAPAROSCOPIC REVISION VENTRICULAR-PERITONEAL (V-P) SHUNT N/A 08/26/2018   Procedure: LAPAROSCOPIC INSERTION VENTRICULAR-PERITONEAL (V-P) SHUNT;  Surgeon: Julio Sicks, MD;  Location: [facility] OR;  Service: Neurosurgery;  Laterality:  N/A;  . LOOP RECORDER INSERTION N/A 04/10/2017   Procedure: Loop Recorder Insertion;  Surgeon: Hillis Range, MD;  Location: [facility] INVASIVE CV LAB;  Service: Cardiovascular;  Laterality: N/A;  . SHUNT REMOVAL Right 03/13/2016   Procedure: SHUNT REMOVAL;  Surgeon: Julio Sicks, MD;  Location: [facility] NEURO ORS;  Service: Neurosurgery;  Laterality: Right;  . SHUNT REMOVAL Right 08/09/2018   Procedure: SHUNT REMOVAL With Placement of Ventricular Catheter;  Surgeon: Lisbeth Renshaw, MD;  Location: [facility] OR;  Service: Neurosurgery;  Laterality: Right;  . SHUNT REVISION Right 08/05/2018   Procedure: SHUNT REVISION;  Surgeon: Julio Sicks, MD;  Location: [facility] OR;  Service: Neurosurgery;  Laterality: Right;  . SHUNT REVISION VENTRICULAR-PERITONEAL Left 08/26/2018   Procedure: SHUNT REVISION VENTRICULAR-PERITONEAL;  Surgeon: Julio Sicks, MD;  Location: [facility] OR;  Service: Neurosurgery;  Laterality: Left;  [name] VASECTOMY  10/02/1997  . 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 VENTRICULOPERITONEAL SHUNT Right 07/22/2018   Procedure: Shunt Placment right occipital;  Surgeon: Julio Sicks, MD;  Location: [facility] OR;  Service: Neurosurgery;  Laterality: Right;  . VENTRICULOPERITONEAL SHUNT Left 08/26/2018   Procedure: LEFT SIDED VENTRICULAR-PERITONEAL SHUNT;  Surgeon: Julio Sicks, MD;  Location: [facility] OR;  Service: Neurosurgery;  Laterality: Left;  [name] VENTRICULOSTOMY Right 08/09/2018   Procedure: VENTRICULOSTOMY;  Surgeon: Lisbeth Renshaw, MD;  Location: [facility] OR;  Service: Neurosurgery;  Laterality: Right;   Family History  Problem Relation Age of Onset  . COPD Mother   .  Lung cancer Father   . Alzheimer's disease Father    Social History:  reports that he has never smoked. He has never used smokeless tobacco. He reports that he drinks  alcohol. He reports that he does not use drugs. Allergies: No Known Allergies Medications Prior to Admission  Medication Sig Dispense Refill  . acetaminophen (TYLENOL) 500 MG tablet Take 500-1,000 mg by mouth every 6 (six) hours as needed for headache (pain).    . Artificial Tear Ointment (DRY EYES OP) Apply 1 drop to eye daily as needed (for drye eyes).    [name] atorvastatin (LIPITOR) 40 MG tablet Take 1 tablet (40 mg total) by mouth daily. 90 tablet 3  . Multiple Vitamins-Minerals (OCUVITE EYE HEALTH FORMULA) CAPS Take 1 capsule by mouth daily.    . ondansetron (ZOFRAN) 4 MG tablet Take 4 mg by mouth 4 (four) times daily as needed for nausea or vomiting.    . sodium chloride (OCEAN) 0.65 % SOLN nasal spray Place 1 spray into both nostrils as needed for congestion.    . traMADol (ULTRAM) 50 MG tablet Take 1 tablet (50 mg total) by mouth every 6 (six) hours as needed. (Patient taking differently: Take 50 mg by mouth at bedtime as needed (pain). ) 30 tablet 1    Home: Home Living Family/patient expects to be discharged to:: Private residence Living Arrangements: Spouse/significant other Available Help at Discharge: Family, Available 24 hours/day Type of Home: House Home Access: Stairs to enter [employer] of Steps: 5 Entrance Stairs-Rails: Can reach both Home Layout: Two level Alternate Level Stairs-Number of Steps: flight  Alternate Level Stairs-Rails: Left Bathroom Shower/Tub: Tub/shower unit, Health visitor: Administrator Accessibility: Yes Home Equipment: None  Lives With: Spouse  Functional History: Prior Function Level of Independence: Independent 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 went home with wife at discharge 4 days PTA and was ambulating without an AD.  Functional Status:  Mobility: Bed Mobility Overal bed mobility: Needs Assistance Bed Mobility: Supine to Sit Supine to sit: Mod assist, HOB elevated General  bed mobility comments: Mod assist to help support trunk and initiate movement of legs to side of bed.  Transfers Overall transfer level: Needs assistance Equipment used: 2 person hand held assist Transfers: Sit to/from Stand, Stand Pivot Transfers Sit to Stand: Mod assist, +2 physical assistance Stand pivot transfers: +2 physical assistance, Mod assist General transfer comment: Two person mod assist to stand to [facility] and stand multiple times from [facility] switching out the [facility] pulling up the recliner chair once he tried to have a BM.   Ambulation/Gait General Gait Details: NT today as significant time was spent trying to get to [facility] and into recliner.  Will likely need to start with two person hand held assist vs two people and the RW.    ADL:    Cognition: Cognition Overall Cognitive Status: Impaired/Different from baseline Arousal/Alertness: Awake/alert Orientation Level: Other (comment)(dysphagia) Cognition Arousal/Alertness: Awake/alert Behavior During Therapy: Restless Overall Cognitive Status: Impaired/Different from baseline Area of Impairment: Orientation, Attention, Memory, Following commands, Safety/judgement, Awareness, Problem solving Orientation Level: Disoriented to, Place, Time, Situation Current Attention Level: Focused Memory: Decreased recall of precautions, Decreased short-term memory Following Commands: Follows one step commands with increased time, Follows one step commands inconsistently(25% of commands) Safety/Judgement: Decreased awareness of safety, Decreased awareness of deficits Awareness: Intellectual Problem Solving: Slow processing, Decreased initiation, Difficulty sequencing, Requires verbal cues, Requires tactile cues General Comments: Pt with significant decline in cognition  compared to his previous admission, difficulty with processing, initiation, global aphasia, impulsive and restless.  Blood pressure (!) 133/91, pulse 100, temperature 98.9 F (37.2 C),  temperature source Oral, resp. rate 16, height 5\' 11"  (1.803 m), weight 81.2 kg, SpO2 99 %. Physical Exam  Vitals reviewed. Constitutional: He appears well-developed and well-nourished.  HENT:  Head: Normocephalic.  VP shunt site clean and dry  Eyes: EOM are normal. Right eye exhibits no discharge. Left eye exhibits no discharge.  Pupils reactive to light  Neck: Normal range of motion. Neck supple. No thyromegaly present.  Cardiovascular: Normal rate and regular rhythm.  Respiratory: Effort normal and breath sounds normal. No respiratory distress.  GI: Soft. Bowel sounds are normal. He exhibits no distension.  Musculoskeletal:  No edema or tenderness in extremities  Neurological: He is alert.  A&Ox0 Patient is aphasic with some spontaneous speech.   He did follow basic commands. Motor: RUE: 3+/5 proximal to distal LUE: 4-/5 proximal to distal RLE: 4/5 proximal to distal LLE: 4+/5 proximal to distal   Skin: Skin is warm and dry.  See above  Psychiatric: His affect is blunt. His speech is delayed. He is slowed.    No results found for this or any previous visit (from the past 24 hour(s)). Ct Head Wo Contrast  Result Date: 08/27/2018 CLINICAL DATA:  Noncommunicating hydrocephalus. Status post VP shunt placement. EXAM: CT HEAD WITHOUT CONTRAST TECHNIQUE: Contiguous axial images were obtained from the base of the skull through the vertex without intravenous contrast. COMPARISON:  08/26/2018 FINDINGS: Brain: The left parietal approach ventriculostomy catheter has been revised and now courses through the atrium of the left lateral ventricle, crosses the midline, and terminates in the body of the right lateral ventricle. Dilatation of the lateral and third ventricles has mildly decreased. There is new partly linear parenchymal hypoattenuation extending from the left temporoparietal junction anteriorly into the left internal capsule region with gas along this tract which may be related to  catheter placement. Periventricular white matter hypoattenuation is similar to the prior study and is compatible transependymal CSF flow. Calcification is again noted in the right parietal lobe deep to a burr hole. No acute intracranial hemorrhage or acute large territory infarct is identified. Vascular: Mild calcified atherosclerosis at the skull base. No hyperdense vessel. Skull: Scalp soft tissue swelling and gas related to VP shunt placement. No acute fracture. Sinuses/Orbits: Paranasal sinuses and mastoid air cells are clear. Unremarkable orbits. Other: None. IMPRESSION: 1. Interval revision of ventriculostomy catheter with mildly improved lateral and third ventriculomegaly. Persistent periventricular hypoattenuation compatible with transependymal CSF flow. 2. Edema and gas extending into the left internal capsule region which may be related to catheter placement. Electronically Signed   By: Sebastian Ache M.D.   On: 08/27/2018 10:08   Ct Head Wo Contrast  Result Date: 08/26/2018 CLINICAL DATA:  Hydrocephalus, noncommunicating EXAM: CT HEAD WITHOUT CONTRAST TECHNIQUE: Contiguous axial images were obtained from the base of the skull through the vertex without intravenous contrast. COMPARISON:  Yesterday FINDINGS: Brain: VP shunt with a ventriculostomy catheter taking unexpected course through the parenchyma of the left parietal and temporal lobes, tip at the left middle cranial fossa along the superficial temporal lobe. There is pneumocephalus attributed to catheter placement. Continued marked lateral and third ventriculomegaly consistent with hydrocephalus at the level of the cerebral aqua duct. There is transependymal CSF flow which appears similar to prior. No herniation or infarct seen. No acute hemorrhage. There is sequela of prior right parietal hemorrhage. Vascular:  No hyperdense vessel or unexpected calcification. Skull: Gas and swelling related to recent shunt placement. Sinuses/Orbits: Negative  08/26/2018 at 4:30 pm. Dr. Jordan Likes is currently operating. I relayed the message to his MA Henderson [place] who is relaying the message to him in the OR currently. IMPRESSION: 1. VP shunt placement with unexpected course of the ventriculostomy catheter traversing the left cerebral parenchyma and not reaching the ventricular system. 2. Unchanged marked lateral and third ventriculomegaly above cerebral aqua duct obstruction, with transependymal CSF flow. Electronically Signed   By: Marnee Spring M.D.   On: 08/26/2018 16:32    Assessment/Plan: Diagnosis: VP shunt malfunction/infection Labs and images (see above) independently reviewed.  Records reviewed and summated above.  1. Does the need for close, 24 hr/day medical supervision in concert with the patient's rehab needs make it unreasonable for this patient to be served in a less intensive setting? Yes  2. Co-Morbidities requiring supervision/potential complications: hyperlipidemia (cont meds), hx of CVA, HTN (monitor and provide prns in accordance with increased physical exertion and pain), Tachycardia (monitor in accordance with pain and increasing activity), ID (D/c IV Vanc when appropriate), leukocytosis (cont to monitor for signs and symptoms of infection, further workup if indicated), hyponatremia (cont to monitor, treat if necessary) 3. Due to bladder management, bowel management, skin/wound care, disease management, medication administration and patient education, does the patient require 24 hr/day rehab nursing? Yes 4. Does the patient require coordinated care of a physician, rehab nurse, PT (1-2 hrs/day, 5 days/week), OT (1-2 hrs/day, 5 days/week) and SLP (1-2 hrs/day, 5 days/week) to address physical and functional deficits in the context of the above medical diagnosis(es)? Yes Addressing deficits in the following areas: balance, endurance, locomotion, strength, transferring, bathing, dressing, toileting, cognition, speech, language and psychosocial  support 5. Can the patient actively participate in an intensive therapy program of at least 3 hrs of therapy per day at least 5 days per week? Yes 6. The potential for patient to make measurable gains while on inpatient rehab is excellent 7. Anticipated functional outcomes upon discharge from inpatient rehab are supervision  with PT, supervision with OT, supervision with SLP. 8. Estimated rehab length of stay to reach the above functional goals is: 8-12 days. 9. Anticipated D/C setting: Home 10. Anticipated post D/C treatments: HH therapy and Home excercise program 11. Overall Rehab/Functional Prognosis: excellent and good  RECOMMENDATIONS: This patient's condition is appropriate for continued rehabilitative care in the following setting: CIR when medically stable Patient has agreed to participate in recommended program. Potentially Note that insurance prior authorization may be required for reimbursement for recommended care.  Comment: Rehab Admissions Coordinator to follow up.   I have personally performed a face to face diagnostic evaluation, including, but not limited to relevant history and physical exam findings, of this patient and developed relevant assessment and plan.  Additionally, I have reviewed and concur with the physician assistant's documentation above.   Maryla Morrow, MD, ABPMR Mcarthur Rossetti Angiulli, PA-C 08/28/2018

## 2018-08-28 NOTE — Progress Notes (Signed)
  Speech Language Pathology Treatment: Dysphagia;Cognitive-Linquistic  Patient Details Name: Charles Marquez MRN: [id number] DOB: May 22, 1956 Today's Date: 08/28/2018 Time: 4098-1191 SLP Time Calculation (min) (ACUTE ONLY): 31 min  Assessment / Plan / Recommendation Clinical Impression  Pt consumed regular diet textures and thin liquids without overt signs of difficulty. His wife and his RN both report good intake and no concerns for swallowing difficulties. No additional dysphagia f/u needed.   Pt does however continue to have a significant receptive/expressive mixed aphasia. He produces a few social greetings/phrases, but otherwise verbal output is primarily jargon. He does not repeat despite Max cues. He was able to name one item given sentence completion and phonemic cues. He counted from 1-20 with Mod cues. [name] best verbal output came while singing along to familiar music. He exhibited ~75% accuracy during the chorus. He would benefit from intensive SLP f/u to maximize functional communication.   HPI HPI: 62 year old male admitted 08/25/18 with gait disturbance and AMS. 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 Now referred for BSE and SLE.      SLP Plan  Continue with current plan of care       Recommendations  Diet recommendations: Regular;Thin liquid Liquids provided via: Cup;Straw Medication Administration: Whole meds with liquid Supervision: Staff to assist with self feeding;Full supervision/cueing for compensatory strategies Compensations: Minimize environmental distractions;Slow rate;Small sips/bites Postural Changes and/or Swallow Maneuvers: Seated upright 90 degrees                Oral Care Recommendations: Oral care BID Follow up Recommendations: Inpatient Rehab SLP Visit Diagnosis: Dysphagia, unspecified (R13.10);Aphasia (R47.01) Plan: Continue with  current plan of care       GO                Maxcine Ham 08/28/2018, 5:06 PM  Maxcine Ham, M.A. CCC-SLP Acute Herbalist [phone] Office [phone]

## 2018-08-28 NOTE — Progress Notes (Signed)
Carelink Summary Report / Loop Recorder 

## 2018-08-28 NOTE — Progress Notes (Signed)
Inpatient Rehabilitation Admissions Coordinator  I will begin insurance authorization with [employer] for admission to inpt rehab. I will follow up with pt and wife tomorrow.  Ottie Glazier, RN, MSN Rehab Admissions Coordinator [phone] 08/28/2018 5:04 PM

## 2018-08-28 NOTE — Progress Notes (Signed)
Physical Therapy Treatment Patient Details Name: Charles Marquez MRN: [id number] DOB: 08-22-1956 Today's Date: 08/28/2018    History of Present Illness 62 y.o. male admitted on 08/25/18 for increased confusion and gait instability. 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 He was recently d/c home from [facility] on 08/22/18.      PT Comments    Pt is progressing with therapy, but continues to require two person heavy mod assist for short distance gait in the room .  He continues to have signs of global aphasia and has significant decreased awareness of his deficits and safety.  I spoke at length with his wife who would like to pursue CIR level therapies before he returns home to get him more independent.   Follow Up Recommendations  CIR     Equipment Recommendations  Rolling walker with 5" wheels    Recommendations for Other Services Rehab consult     Precautions / Restrictions Precautions Precautions: Fall Precaution Comments: right lateral lean, R LE weakness, R inattention    Mobility  Bed Mobility Overal bed mobility: Needs Assistance Bed Mobility: Supine to Sit     Supine to sit: Min assist;HOB elevated     General bed mobility comments: Min assist to support trunk and help initiate movement to the right side of the bed today.   Transfers Overall transfer level: Needs assistance Equipment used: Rolling walker (2 wheeled) Transfers: Sit to/from Stand Sit to Stand: Mod assist;+2 physical assistance         General transfer comment: Two person mod assist to come to stand initially without an AD, then again with RW.  Assist needed to support trunk due to posterior and right lateral bias which increased with fatigue and repetition.  Manual and verbal cues for safe hand placement during transitions.   Ambulation/Gait Ambulation/Gait assistance: +2 physical  assistance;Mod assist Gait Distance (Feet): 10 Feet Assistive device: Rolling walker (2 wheeled) Gait Pattern/deviations: Step-through pattern;Decreased weight shift to left;Narrow base of support     General Gait Details: RW helped decrease posterior bias, but as he continued to be upright, his right lateral bias with decreased strength at R quad, assist needed to help support his trunk to bring him to midline and manually keep his base wider.           Balance Overall balance assessment: Needs assistance Sitting-balance support: Feet supported;Bilateral upper extremity supported;No upper extremity supported;Single extremity supported Sitting balance-Leahy Scale: Poor Sitting balance - Comments: Min assist in sitting with less right lateral bias in sitting today compared to yesterday, but if left unsupported he falls slowly backwards (posteriorly) and to the right.  Postural control: Posterior lean;Right lateral lean Standing balance support: Bilateral upper extremity supported Standing balance-Leahy Scale: Poor Standing balance comment: Up to two person heavy mod assist as pt fatigues preforming ALD task at the sink.  Multimodal cues to activated R quad, block right knee, and weight shift to the left.                             Cognition Arousal/Alertness: Awake/alert Behavior During Therapy: WFL for tasks assessed/performed Overall Cognitive Status: Impaired/Different from baseline Area of Impairment: Attention;Memory;Following commands;Safety/judgement;Awareness;Problem solving                   Current Attention Level: Sustained Memory: Decreased recall of precautions Following Commands: Follows  one step commands inconsistently Safety/Judgement: Decreased awareness of safety;Decreased awareness of deficits Awareness: Intellectual Problem Solving: Difficulty sequencing;Requires verbal cues;Requires tactile cues General Comments: Pt with some mild  improvements today compared to yesterday, language remains somewhat automatic, but he is stringing together longer phrases today, he is less restless, continues to have significant awareness issues which relay into significant safety issues.           General Comments General comments (skin integrity, edema, etc.): HR 100s-mid 140s during mobility, RN made aware, all other VSS and pt asymptomatic       Pertinent Vitals/Pain Pain Assessment: Faces Pain Score: 0-No pain           PT Goals (current goals can now be found in the care plan section) Acute Rehab PT Goals Patient Stated Goal: to get back to normal, and stop coming to the hospital Progress towards PT goals: Progressing toward goals    Frequency    Min 3X/week      PT Plan Current plan remains appropriate    Co-evaluation PT/OT/SLP Co-Evaluation/Treatment: Yes Reason for Co-Treatment: Complexity of the patient's impairments (multi-system involvement);Necessary to address cognition/behavior during functional activity;For patient/therapist safety;To address functional/ADL transfers PT goals addressed during session: Mobility/safety with mobility;Balance;Proper use of DME;Strengthening/ROM        AM-PAC PT "6 Clicks" Daily Activity  Outcome Measure  Difficulty turning over in bed (including adjusting bedclothes, sheets and blankets)?: Unable Difficulty moving from lying on back to sitting on the side of the bed? : Unable Difficulty sitting down on and standing up from a chair with arms (e.g., wheelchair, bedside commode, etc,.)?: Unable Help needed moving to and from a bed to chair (including a wheelchair)?: A Lot Help needed walking in hospital room?: A Lot Help needed climbing 3-5 steps with a railing? : Total 6 Click Score: 8    End of Session Equipment Utilized During Treatment: Gait belt Activity Tolerance: Patient limited by fatigue Patient left: in chair;with call bell/phone within reach;with  family/visitor present Nurse Communication: Other (comment)(HR increase) PT Visit Diagnosis: Unsteadiness on feet (R26.81);Difficulty in walking, not elsewhere classified (R26.2);Other symptoms and signs involving the nervous system (Z61.096)     Time: 0454-0981 PT Time Calculation (min) (ACUTE ONLY): 28 min  Charges:  $Therapeutic Activity: 8-22 mins           Grosser B. Fabio Wah, PT, DPT  Acute Rehabilitation [phone] pager #[phone] office            08/28/2018, 12:10 PM

## 2018-08-29 LAB — VANCOMYCIN, TROUGH: Vancomycin Tr: 7 ug/mL — ABNORMAL LOW (ref 15–20)

## 2018-08-29 MED ORDER — HEPARIN SODIUM (PORCINE) 5000 UNIT/ML IJ SOLN
5000.0000 [IU] | Freq: Three times a day (TID) | INTRAMUSCULAR | Status: DC
  Administered 2018-08-29 – 2018-08-30 (×3): 5000 [IU] via SUBCUTANEOUS
  Filled 2018-08-29 (×2): qty 1

## 2018-08-29 NOTE — Progress Notes (Signed)
Inpatient Rehabilitation Admissions Coordinator  I met with patient and his wife at bedside to discuss goals and expectations of an inpt rehab admit. They are in agreement. I have begun insurance authorization and am hopeful for admit today pending insurance approval.   Danne Baxter, RN, MSN Rehab Admissions Coordinator [phone] 08/29/2018 11:09 AM

## 2018-08-29 NOTE — Progress Notes (Signed)
Neurosurgery Service Progress Note  Subjective: No acute events overnight, speech and motor functions continue to improve   Objective: Vitals:   08/29/18 0300 08/29/18 0400 08/29/18 0500 08/29/18 0600  BP: (!) 151/91 135/85 (!) 151/80 139/77  Pulse: 60 (!) 47 (!) 117 (!) 47  Resp: 13 14 16 14   Temp:  97.7 F (36.5 C)    TempSrc:  Axillary    SpO2: 99% 97% 97% 97%  Weight:      Height:       Temp (24hrs), Avg:98.3 F (36.8 C), Min:97.7 F (36.5 C), Max:98.9 F (37.2 C)  CBC Latest Ref Rng & Units 08/25/2018 08/25/2018 08/12/2018  WBC 4.0 - 10.5 K/uL 14.2(H) 12.7(H) 8.3  Hemoglobin 13.0 - 17.0 g/dL 16.1 09.6 12.1(L)  Hematocrit 39.0 - 52.0 % 39.0 41.0 35.2(L)  Platelets 150 - 400 K/uL 116(L) 191 185   BMP Latest Ref Rng & Units 08/25/2018 08/25/2018 08/19/2018  Glucose 70 - 99 mg/dL 045(W) 098(J) 191(Y)  BUN 6 - 20 mg/dL 20 20 17   Creatinine 0.61 - 1.24 mg/dL 7.82 9.56 2.13  Sodium 135 - 145 mmol/L 128(L) 127(L) 138  Potassium 3.5 - 5.1 mmol/L 3.9 4.0 4.5  Chloride 98 - 111 mmol/L 96(L) 96(L) 104  CO2 22 - 32 mmol/L 22 22 27   Calcium 8.9 - 10.3 mg/dL 0.8(M) 5.7(Q) 4.6(N)    Intake/Output Summary (Last 24 hours) at 08/29/2018 0843 Last data filed at 08/29/2018 0400 Gross per 24 hour  Intake 276.93 ml  Output 1400 ml  Net -1123.07 ml    Current Facility-Administered Medications:  .  acetaminophen (TYLENOL) tablet 650 mg, 650 mg, Oral, Q4H PRN **OR** acetaminophen (TYLENOL) suppository 650 mg, 650 mg, Rectal, Q4H PRN, Pool, Sherilyn Cooter, MD .  bisacodyl (DULCOLAX) suppository 10 mg, 10 mg, Rectal, Daily PRN, Julio Sicks, MD .  HYDROcodone-acetaminophen (NORCO/VICODIN) 5-325 MG per tablet 1 tablet, 1 tablet, Oral, Q4H PRN, Julio Sicks, MD .  magic mouthwash, 5 mL, Oral, QID PRN, Julio Sicks, MD, 5 mL at 08/28/18 1838 .  ondansetron (ZOFRAN) tablet 4 mg, 4 mg, Oral, Q4H PRN **OR** [DISCONTINUED] ondansetron (ZOFRAN) injection 4 mg, 4 mg, Intravenous, Q4H PRN, Pool, Henry, MD .   polyethylene glycol (MIRALAX / GLYCOLAX) packet 17 g, 17 g, Oral, Daily, Pool, Sherilyn Cooter, MD, 17 g at 08/28/18 1152 .  polyvinyl alcohol (LIQUIFILM TEARS) 1.4 % ophthalmic solution 1 drop, 1 drop, Both Eyes, PRN, Pool, Henry, MD .  promethazine (PHENERGAN) tablet 12.5-25 mg, 12.5-25 mg, Oral, Q4H PRN, Julio Sicks, MD   Physical Exam: Awake/alert, speech with short responses but appropriate, repetition intact, +paraphasic errors, Strength 5/5 on L, 4+/5 on R w/ mild drift Abdomen S NT ND Incisions c/d/i  Assessment & Plan: 62 y.o. man with complicated hydrocephalus s/p shunt revision, recovering well. -discharge to CIR when bed available -activity as tolerated -f/u PT, OT, PM&R, SLP recs -SCDs/TEDs, start SQH  Jadene Pierini  08/29/18 8:43 AM

## 2018-08-29 NOTE — H&P (Signed)
Physical Medicine and Rehabilitation Admission H&P    Chief Complaint  Patient presents with  . Gait Problem  . Altered Mental Status  : HPI: General Wearing is a 62 year old right-handed male with history of hyperlipidemia, 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  Per chart review and wife, patient lives with spouse.  Works in Consulting civil engineer for [employer].  Patient ambulating without assistive device after recent discharge.  Two-level home 5 steps to entry.  Patient with recent admission 08/04/2018 for unsteadiness of gait and nausea vomiting findings of malfunction of VP shunt with revision completed 08/05/2018 and discharged home.  He was readmitted 08/08/2018 to 08/22/2018 for VP shunt infection organism isolated Klebsiella.  He again underwent removal of VP shunt with placement of external ventriculostomy and completed a 2-week course of IV antibiotics.  Follow-up scan reviewed showing right hydrocephalus.  Per report, mildly enlarged right lateral ventricle but left ventricle and third ventricle and fourth ventricle were small.  Noted generalized decline with unstable gait noted bouts of aphasia.  He was again readmitted 08/26/2018 for shunt revision ventricular peritoneal 08/26/2018 per Dr. Jordan Likes.  Subcutaneous heparin was added for DVT prophylaxis on 08/29/2018.  Tolerating a regular diet.  Therapy evaluations completed with recommendations of physical medicine rehab consult.  Patient was admitted for a comprehensive rehab program.  Review of Systems  Unable to perform ROS: Language   Past Medical History:  Diagnosis Date  . Anxiety   . Hypercholesteremia   . Stroke (HCC)    tia's  . TIA (transient ischemic attack)    09.15   Past Surgical History:  Procedure Laterality Date  . Fractured arm Left 12  . HERNIA REPAIR Right 3/12  . LAPAROSCOPIC REVISION VENTRICULAR-PERITONEAL (V-P) SHUNT N/A 08/26/2018   Procedure: LAPAROSCOPIC  INSERTION VENTRICULAR-PERITONEAL (V-P) SHUNT;  Surgeon: Julio Sicks, MD;  Location: [facility] OR;  Service: Neurosurgery;  Laterality: N/A;  . LOOP RECORDER INSERTION N/A 04/10/2017   Procedure: Loop Recorder Insertion;  Surgeon: Hillis Range, MD;  Location: [facility] INVASIVE CV LAB;  Service: Cardiovascular;  Laterality: N/A;  . SHUNT REMOVAL Right 03/13/2016   Procedure: SHUNT REMOVAL;  Surgeon: Julio Sicks, MD;  Location: [facility] NEURO ORS;  Service: Neurosurgery;  Laterality: Right;  . SHUNT REMOVAL Right 08/09/2018   Procedure: SHUNT REMOVAL With Placement of Ventricular Catheter;  Surgeon: Lisbeth Renshaw, MD;  Location: [facility] OR;  Service: Neurosurgery;  Laterality: Right;  . SHUNT REVISION Right 08/05/2018   Procedure: SHUNT REVISION;  Surgeon: Julio Sicks, MD;  Location: [facility] OR;  Service: Neurosurgery;  Laterality: Right;  . SHUNT REVISION VENTRICULAR-PERITONEAL Left 08/26/2018   Procedure: SHUNT REVISION VENTRICULAR-PERITONEAL;  Surgeon: Julio Sicks, MD;  Location: [facility] OR;  Service: Neurosurgery;  Laterality: Left;  [name] VASECTOMY  10/02/1997  . 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 VENTRICULOPERITONEAL SHUNT Right 07/22/2018   Procedure: Shunt Placment right occipital;  Surgeon: Julio Sicks, MD;  Location: [facility] OR;  Service: Neurosurgery;  Laterality: Right;  . VENTRICULOPERITONEAL SHUNT Left 08/26/2018   Procedure: LEFT SIDED VENTRICULAR-PERITONEAL SHUNT;  Surgeon: Julio Sicks, MD;  Location: [facility] OR;  Service: Neurosurgery;  Laterality: Left;  [name] VENTRICULOSTOMY Right 08/09/2018   Procedure: VENTRICULOSTOMY;  Surgeon: Lisbeth Renshaw, MD;  Location: [facility] OR;  Service: Neurosurgery;  Laterality: Right;   Family History  Problem Relation Age of Onset  . COPD Mother   . Lung cancer Father   . Alzheimer's disease  Father    Social History:  reports that he has never smoked. He has never used smokeless tobacco. He reports that he drinks alcohol. He reports that he does not use drugs. Allergies: No Known Allergies Medications Prior to Admission  Medication Sig Dispense Refill  . acetaminophen (TYLENOL) 500 MG tablet Take 500-1,000 mg by mouth every 6 (six) hours as needed for headache (pain).    . Artificial Tear Ointment (DRY EYES OP) Apply 1 drop to eye daily as needed (for drye eyes).    [name] atorvastatin (LIPITOR) 40 MG tablet Take 1 tablet (40 mg total) by mouth daily. 90 tablet 3  . Multiple Vitamins-Minerals (OCUVITE EYE HEALTH FORMULA) CAPS Take 1 capsule by mouth daily.    . ondansetron (ZOFRAN) 4 MG tablet Take 4 mg by mouth 4 (four) times daily as needed for nausea or vomiting.    . sodium chloride (OCEAN) 0.65 % SOLN nasal spray Place 1 spray into both nostrils as needed for congestion.    . traMADol (ULTRAM) 50 MG tablet Take 1 tablet (50 mg total) by mouth every 6 (six) hours as needed. (Patient taking differently: Take 50 mg by mouth at bedtime as needed (pain). ) 30 tablet 1    Drug Regimen Review Drug regimen was reviewed and remains appropriate with no significant issues identified  Home: Home Living Family/patient expects to be discharged to:: Private residence Living Arrangements: Spouse/significant other Available Help at Discharge: Family, Available 24 hours/day Type of Home: House Home Access: Stairs to enter [employer] of Steps: 5 Entrance Stairs-Rails: Can reach both Home Layout: Two level Alternate Level Stairs-Number of Steps: flight  Alternate Level Stairs-Rails: Left Bathroom Shower/Tub: Tub/shower unit, Health visitor: Administrator Accessibility: Yes Home Equipment: None  Lives With: Spouse   Functional History: Prior Function Level of Independence: Independent 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  went home with wife at discharge 4 days PTA and was ambulating without an AD.   Functional Status:  Mobility: Bed Mobility Overal bed mobility: Needs Assistance Bed Mobility: Supine to Sit Supine to sit: Min assist, HOB elevated General bed mobility comments: Min assist to support trunk and help initiate movement to the right side of the bed today.  Transfers Overall transfer level: Needs assistance Equipment used: Rolling walker (2 wheeled) Transfers: Sit to/from Stand Sit to Stand: Mod assist, +2 physical assistance Stand pivot transfers: +2 physical assistance, Mod assist General transfer comment: Mod assist +2 to come to stand initially without an AD, then again with RW.  Assist needed to support trunk due to posterior and right lateral bias which increased with fatigue and repetition.  Manual and verbal cues for safe hand placement during transitions.  Ambulation/Gait Ambulation/Gait assistance: +2 physical assistance, Mod assist Gait Distance (Feet): 10 Feet Assistive device: Rolling walker (2 wheeled) Gait Pattern/deviations: Step-through pattern, Decreased weight shift to left, Narrow base of support General Gait Details: RW helped decrease posterior bias, but as he continued to be upright, his right lateral bias with decreased strength at R quad, assist needed to help support his trunk to bring him to midline and manually keep his base wider.     ADL: ADL Overall ADL's : Needs assistance/impaired Eating/Feeding: Set up Grooming: Minimal assistance, Standing Grooming Details (indicate cue type and  reason): +2 modA to maintain balance in standing, min assist for oral care Upper Body Bathing: Minimal assistance, Sitting Lower Body Bathing: Moderate assistance, Sit to/from stand, +2 for physical assistance Upper Body Dressing : Minimal assistance, Sitting Lower Body Dressing: Moderate assistance, Sit to/from stand, +2 for physical assistance, +2 for safety/equipment Toilet  Transfer: Moderate assistance, +2 for physical assistance, +2 for safety/equipment, Ambulation, RW Toileting- Clothing Manipulation and Hygiene: Total assistance, +2 for physical assistance, Sit to/from stand Functional mobility during ADLs: Moderate assistance, +2 for physical assistance, +2 for safety/equipment, Rolling walker General ADL Comments: pt completing func mobility into restroom, requires +2 assist for R lateral lean and R inattention  Cognition: Cognition Overall Cognitive Status: Impaired/Different from baseline Arousal/Alertness: Awake/alert Orientation Level: Oriented to person, Oriented to place, Oriented to time, Disoriented to situation Cognition Arousal/Alertness: Awake/alert Behavior During Therapy: WFL for tasks assessed/performed Overall Cognitive Status: Impaired/Different from baseline Area of Impairment: Attention, Memory, Following commands, Safety/judgement, Awareness, Problem solving Orientation Level: Disoriented to, Place, Time, Situation Current Attention Level: Sustained Memory: Decreased recall of precautions Following Commands: Follows one step commands inconsistently Safety/Judgement: Decreased awareness of safety, Decreased awareness of deficits Awareness: Intellectual Problem Solving: Difficulty sequencing, Requires verbal cues, Requires tactile cues General Comments: Pt continues to have decreased awareness and safety, language remains automatic.  Physical Exam: Blood pressure 139/77, pulse (!) 47, temperature 97.7 F (36.5 C), temperature source Axillary, resp. rate 14, height 5\' 11"  (1.803 m), weight 81.2 kg, SpO2 97 %. Physical Exam  Constitutional: No distress.  HENT:  Head: Normocephalic.  Eyes: Pupils are equal, round, and reactive to light.  Neck: Normal range of motion.  Cardiovascular: Normal rate and regular rhythm.  Respiratory: Effort normal.  GI: Soft. There is tenderness.  Musculoskeletal: He exhibits no edema.  Neurological:  He is alert.  Pt alert. Spontaneous speech. Delayed processing. Language intact. Follows basic commands. Can answer some simple biographical questions. Moves all 4's 4 to 4+/5. No sensory findings. .   Skin: Skin is warm. He is not diaphoretic.  Scalp, abdominal incisions clean    Results for orders placed or performed during the hospital encounter of 08/25/18 (from the past 48 hour(s))  Vancomycin, trough     Status: Abnormal   Collection Time: 08/29/18  3:44 AM  Result Value Ref Range   Vancomycin Tr 7 (L) 15 - 20 ug/mL    Comment: Performed at [facility], [address], [place], [place] [address]   Ct Head Wo Contrast  Result Date: 08/27/2018 CLINICAL DATA:  Noncommunicating hydrocephalus. Status post VP shunt placement. EXAM: CT HEAD WITHOUT CONTRAST TECHNIQUE: Contiguous axial images were obtained from the base of the skull through the vertex without intravenous contrast. COMPARISON:  08/26/2018 FINDINGS: Brain: The left parietal approach ventriculostomy catheter has been revised and now courses through the atrium of the left lateral ventricle, crosses the midline, and terminates in the body of the right lateral ventricle. Dilatation of the lateral and third ventricles has mildly decreased. There is new partly linear parenchymal hypoattenuation extending from the left temporoparietal junction anteriorly into the left internal capsule region with gas along this tract which may be related to catheter placement. Periventricular white matter hypoattenuation is similar to the prior study and is compatible transependymal CSF flow. Calcification is again noted in the right parietal lobe deep to a burr hole. No acute intracranial hemorrhage or acute large territory infarct is identified. Vascular: Mild calcified atherosclerosis at the skull base. No hyperdense vessel. Skull: Scalp  soft tissue swelling and gas related to VP shunt placement. No acute fracture. Sinuses/Orbits: Paranasal sinuses  and mastoid air cells are clear. Unremarkable orbits. Other: None. IMPRESSION: 1. Interval revision of ventriculostomy catheter with mildly improved lateral and third ventriculomegaly. Persistent periventricular hypoattenuation compatible with transependymal CSF flow. 2. Edema and gas extending into the left internal capsule region which may be related to catheter placement. Electronically Signed   By: Sebastian Ache M.D.   On: 08/27/2018 10:08       Medical Problem List and Plan: 1.  Decreased functional mobility with unsteady gait and aphasia secondary to VP shunt malfunction/infection.  Status post shunt revision 08/26/2018  -admit to inpatient rehab 2.  DVT Prophylaxis/Anticoagulation: Subcutaneous heparin initiated 08/29/2018.  Monitor for any signs of DVT 3. Pain Management: Hydrocodone as needed 4. Mood: Provide emotional support 5. Neuropsych: This patient is capable of making decisions on his own behalf. 6. Skin/Wound Care: Routine skin checks 7. Fluids/Electrolytes/Nutrition: Routine in and outs with follow-up chemistries 8.  Constipation.  Laxative assistance    Post Admission Physician Evaluation: 1. Functional deficits secondary  to VP shunt dysfunction/hydrocephalus. 2. Patient is admitted to receive collaborative, interdisciplinary care between the physiatrist, rehab nursing staff, and therapy team. 3. Patient's level of medical complexity and substantial therapy needs in context of that medical necessity cannot be provided at a lesser intensity of care such as a SNF. 4. Patient has experienced substantial functional loss from his/her baseline which was documented above under the "Functional History" and "Functional Status" headings.  Judging by the patient's diagnosis, physical exam, and functional history, the patient has potential for functional progress which will result in measurable gains while on inpatient rehab.  These gains will be of substantial and practical use upon  discharge  in facilitating mobility and self-care at the household level. 5. Physiatrist will provide 24 hour management of medical needs as well as oversight of the therapy plan/treatment and provide guidance as appropriate regarding the interaction of the two. 6. The Preadmission Screening has been reviewed and patient status is unchanged unless otherwise stated above. 7. 24 hour rehab nursing will assist with bladder management, bowel management, safety, skin/wound care, disease management, medication administration, pain management and patient education  and help integrate therapy concepts, techniques,education, etc. 8. PT will assess and treat for/with: Lower extremity strength, range of motion, stamina, balance, functional mobility, safety, adaptive techniques and equipment .   Goals are: supervision. 9. OT will assess and treat for/with: ADL's, functional mobility, safety, upper extremity strength, adaptive techniques and equipment, NMR, family ed.   Goals are: supervision. Therapy may proceed with showering this patient. 10. SLP will assess and treat for/with: cognition, communication, family ed.  Goals are: supervision to min assist. 11. Case Management and Social Worker will assess and treat for psychological issues and discharge planning. 12. Team conference will be held weekly to assess progress toward goals and to determine barriers to discharge. 13. Patient will receive at least 3 hours of therapy per day at least 5 days per week. 14. ELOS: 8-12 days       15. Prognosis:  excellent   I have personally performed a face to face diagnostic evaluation of this patient and formulated the key components of the plan.  Additionally, I have personally reviewed laboratory data, imaging studies, as well as relevant notes and concur with the physician assistant's documentation above.  Ranelle Oyster, MD, FAAPMR    Mcarthur Rossetti Angiulli, PA-C 08/29/2018

## 2018-08-29 NOTE — Progress Notes (Signed)
Inpatient Rehabilitation Admissions Coordinator  I was notified by [place] case manager, Dois Davenport, that she has obtained coverage with the account executive to cover 7 days of inpt rehab. I have notified pt's wife by phone. We will plan to admit tomorrow. She is in agreement. I have notified RN CM of change in rehab venue now that insurance will cover the inpt rehab.I will follow up in th morning for admission.  Ottie Glazier, RN, MSN Rehab Admissions Coordinator [phone] 08/29/2018 6:20 PM

## 2018-08-29 NOTE — Progress Notes (Signed)
Inpatient Rehabilitation Admissions Coordinator  I met with patient, wife, and daughter at bedside. I explained that his [place] does not have the benefit coverage for inpatient acute rehab. I explained their options for rehab venue of SNF, [place] or outpt. Wife would like to arrange outpt therapy at d/c. I have alerted RN CM and SW of plan. Please call me with any questions.  Danne Baxter, RN, MSN Rehab Admissions Coordinator [phone] 08/29/2018 4:51 PM

## 2018-08-29 NOTE — Progress Notes (Addendum)
Physical Therapy Treatment Patient Details Name: Charles Marquez MRN: [id number] DOB: 1956-03-27 Today's Date: 08/29/2018    History of Present Illness 62 y.o. male admitted on 08/25/18 for increased confusion and gait instability. 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 He was recently d/c home from [facility] on 08/22/18.      PT Comments    Pt is improving, but can still be a mod assist for LOB during gait.  He seemed to do better without the RW as he did not have to control the steering.  He did start to fall apart a little at the end of gait as he fatigued.  [name] assisted with gait and his care.  Pt remains appropriate for CIR level therapies at discharge.    Follow Up Recommendations  Supervision/Assistance - 24 hour;CIR     Equipment Recommendations  3in1 (PT)    Recommendations for Other Services   NA     Precautions / Restrictions Precautions Precautions: Fall Precaution Comments: right lateral lean, R LE weakness, R inattention    Mobility  Bed Mobility               General bed mobility comments: Pt was OOB in the recliner chair.   Transfers Overall transfer level: Needs assistance Equipment used: Rolling walker (2 wheeled);None Transfers: Sit to/from Stand Sit to Stand: Min assist;+2 physical assistance         General transfer comment: Two person min assist for safety and to help control his impulsivity during transitions to stand, still with right lateral lean/list in standing.   Ambulation/Gait Ambulation/Gait assistance: +2 physical assistance;Min assist;Mod assist Gait Distance (Feet): 160 Feet Assistive device: Rolling walker (2 wheeled);2 person hand held assist Gait Pattern/deviations: Step-through pattern;Decreased weight shift to left;Narrow base of support Gait velocity: often too fast to be safe, cues to slow down.     General Gait Details: Pt continues to have right lateral lean, however, less eaggerated today, he needs cues for safety and awareness of obstacles, He needs support at trunk and heavy support to steer RW during gait, so second loop done wih two person hand held assist almost seemed better because he was not struggling to control the walker.           Balance Overall balance assessment: Needs assistance Sitting-balance support: Feet supported;Bilateral upper extremity supported Sitting balance-Leahy Scale: Fair     Standing balance support: Bilateral upper extremity supported;No upper extremity supported;Single extremity supported Standing balance-Leahy Scale: Poor Standing balance comment: needs external support.                             Cognition Arousal/Alertness: Awake/alert Behavior During Therapy: WFL for tasks assessed/performed Overall Cognitive Status: Impaired/Different from baseline Area of Impairment: Orientation;Attention;Memory;Following commands;Safety/judgement;Awareness;Problem solving                 Orientation Level: Disoriented to;Time;Place;Situation Current Attention Level: Sustained Memory: Decreased recall of precautions;Decreased short-term memory Following Commands: Follows one step commands consistently Safety/Judgement: Decreased awareness of safety;Decreased awareness of deficits Awareness: Intellectual Problem Solving: Difficulty sequencing;Requires verbal cues;Requires tactile cues General Comments: Pt improving with his cognition, but remains impulsive with decreased safety awareness.  He has issues sequencing through a task like washing his hands without cues and does not seem to retain much from session to session despite his improvement (decreased STM).  General Comments General comments (skin integrity, edema, etc.): Did a cognitive exercise working on him using clues in his room to determine, day, date, month.  He  was able to read the calendar and tell me the day and the date, but unable to find the month which still makes me wonder if there is a lingering visual deficit.       Pertinent Vitals/Pain Pain Assessment: No/denies pain Pain Score: 0-No pain           PT Goals (current goals can now be found in the care plan section) Acute Rehab PT Goals Patient Stated Goal: to get back to normal, and stop coming to the hospital Progress towards PT goals: Progressing toward goals    Frequency    Min 3X/week      PT Plan Discharge plan needs to be updated       AM-PAC PT "6 Clicks" Daily Activity  Outcome Measure  Difficulty turning over in bed (including adjusting bedclothes, sheets and blankets)?: Unable Difficulty moving from lying on back to sitting on the side of the bed? : Unable Difficulty sitting down on and standing up from a chair with arms (e.g., wheelchair, bedside commode, etc,.)?: Unable Help needed moving to and from a bed to chair (including a wheelchair)?: A Lot Help needed walking in hospital room?: A Lot Help needed climbing 3-5 steps with a railing? : A Lot 6 Click Score: 9    End of Session Equipment Utilized During Treatment: Gait belt Activity Tolerance: Patient tolerated treatment well Patient left: in chair;with call bell/phone within reach;with family/visitor present   PT Visit Diagnosis: Unsteadiness on feet (R26.81);Difficulty in walking, not elsewhere classified (R26.2);Other symptoms and signs involving the nervous system (Z61.096)     Time: 0454-0981 PT Time Calculation (min) (ACUTE ONLY): 24 min  Charges:  $Gait Training: 8-22 mins $Therapeutic Activity: 8-22 mins                    Merrie Epler B. Suhail Peloquin, PT, DPT  Acute Rehabilitation [phone] pager #[phone] office   08/29/2018, 6:24 PM

## 2018-08-29 NOTE — Discharge Summary (Signed)
Discharge Summary  Date of Admission: 08/25/2018  Date of Discharge: 08/29/18  Attending Physician: Autumn Patty, MD  Hospital Course: Patient was previously discharged for a shunt infection on a shunt holiday and was doing well. He presented with gait ataxia and confusion, which are consistent with his symptoms when he has hydrocephalus. A CT head confirmed interval ventriculomegaly. He was taken to the OR on 10/14 for placement of a new VP shunt placement with the assistance of general surgery. Post-operative imaging showed his proximal catheter in suboptimal position so he was taken back to the OR for successful repositioning. He had some mixed aphasia post-operatively as well as cognitive issues with expected partial recovery in the days following surgery. He was seen by PT/OT/SLP/PM&R, who recommended rehab placement. He was discharged to rehab on 08/30/18.   Neurologic exam at discharge:  Awake/alert, PERRL, EOMI, FS, TM Speech partially fluent with paraphasic errors, repetition intact Strength 5/5 x4, SILTx4, no drift  Jadene Pierini, MD 08/29/18 12:16 PM

## 2018-08-29 NOTE — Progress Notes (Signed)
  Speech Language Pathology Treatment: Cognitive-Linquistic  Patient Details Name: Charles Marquez MRN: [id number] DOB: 1956/08/26 Today's Date: 08/29/2018 Time: 5621-3086 SLP Time Calculation (min) (ACUTE ONLY): 20 min  Assessment / Plan / Recommendation Clinical Impression  Skilled treatment session focused on communication goals. SLP facilitated session by providing Min A sentence completion cues for patient to name functional items (10 objects) with 100% accuracy. Intermittent phonemic paraphasias noted that he was able to self-correct with supervision verbal cues. Patient answered basic biographical questions at the word level with extra time and Mod A question cues with jargon noted that patient was unable to self-monitor and correct. Patient was also able to independently perform oral reading tasks at the word and phrase level with 100% accuracy. Patient continues to make excellent gains towards goals and would benefit from inpatient rehab. Continue with current plan of care.    HPI HPI: 62 year old male admitted 08/25/18 with gait disturbance and AMS. 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 Now referred for BSE and SLE.      SLP Plan  Continue with current plan of care      Recommendations                 Oral Care Recommendations: Oral care BID Follow up Recommendations: Inpatient Rehab SLP Visit Diagnosis: Aphasia (R47.01) Plan: Continue with current plan of care       GO                Jennalyn Cawley 08/29/2018, 2:42 PM  Feliberto Gottron, MA, CCC-SLP [phone]

## 2018-08-30 ENCOUNTER — Encounter (HOSPITAL_COMMUNITY): Payer: Self-pay | Admitting: Emergency Medicine

## 2018-08-30 ENCOUNTER — Other Ambulatory Visit: Payer: Self-pay

## 2018-08-30 ENCOUNTER — Inpatient Hospital Stay (HOSPITAL_COMMUNITY)
Admission: RE | Admit: 2018-08-30 | Discharge: 2018-09-02 | DRG: 092 | Disposition: A | Discharge status: Home / Self Care | Payer: BLUE CROSS/BLUE SHIELD | Source: Intra-hospital | Attending: Physical Medicine & Rehabilitation | Admitting: Physical Medicine & Rehabilitation

## 2018-08-30 DIAGNOSIS — Z8673 Personal history of transient ischemic attack (TIA), and cerebral infarction without residual deficits: Secondary | ICD-10-CM

## 2018-08-30 DIAGNOSIS — E785 Hyperlipidemia, unspecified: Secondary | ICD-10-CM | POA: Diagnosis present

## 2018-08-30 DIAGNOSIS — K5900 Constipation, unspecified: Secondary | ICD-10-CM | POA: Diagnosis present

## 2018-08-30 DIAGNOSIS — G919 Hydrocephalus, unspecified: Secondary | ICD-10-CM | POA: Diagnosis present

## 2018-08-30 DIAGNOSIS — E663 Overweight: Secondary | ICD-10-CM | POA: Diagnosis present

## 2018-08-30 DIAGNOSIS — D62 Acute posthemorrhagic anemia: Secondary | ICD-10-CM | POA: Diagnosis present

## 2018-08-30 DIAGNOSIS — E871 Hypo-osmolality and hyponatremia: Secondary | ICD-10-CM | POA: Diagnosis present

## 2018-08-30 DIAGNOSIS — G911 Obstructive hydrocephalus: Secondary | ICD-10-CM

## 2018-08-30 DIAGNOSIS — R2689 Other abnormalities of gait and mobility: Secondary | ICD-10-CM | POA: Diagnosis present

## 2018-08-30 DIAGNOSIS — Z982 Presence of cerebrospinal fluid drainage device: Secondary | ICD-10-CM

## 2018-08-30 DIAGNOSIS — R4701 Aphasia: Secondary | ICD-10-CM | POA: Diagnosis present

## 2018-08-30 DIAGNOSIS — G918 Other hydrocephalus: Secondary | ICD-10-CM | POA: Diagnosis not present

## 2018-08-30 DIAGNOSIS — K5901 Slow transit constipation: Secondary | ICD-10-CM

## 2018-08-30 DIAGNOSIS — E7800 Pure hypercholesterolemia, unspecified: Secondary | ICD-10-CM | POA: Diagnosis present

## 2018-08-30 LAB — CBC
HCT: 37.8 % — ABNORMAL LOW (ref 39.0–52.0)
Hemoglobin: 12.6 g/dL — ABNORMAL LOW (ref 13.0–17.0)
MCH: 31.3 pg (ref 26.0–34.0)
MCHC: 33.3 g/dL (ref 30.0–36.0)
MCV: 94 fL (ref 80.0–100.0)
Platelets: 161 10*3/uL (ref 150–400)
RBC: 4.02 MIL/uL — AB (ref 4.22–5.81)
RDW: 12.9 % (ref 11.5–15.5)
WBC: 7.8 10*3/uL (ref 4.0–10.5)
nRBC: 0 % (ref 0.0–0.2)

## 2018-08-30 LAB — CREATININE, SERUM
CREATININE: 0.74 mg/dL (ref 0.61–1.24)
GFR calc Af Amer: >60 mL/min (ref >60)

## 2018-08-30 MED ORDER — HEPARIN SODIUM (PORCINE) 5000 UNIT/ML IJ SOLN: 5000.0000 [IU] | Freq: Three times a day (TID) | INTRAMUSCULAR | Status: DC

## 2018-08-30 MED ORDER — ONDANSETRON HCL 4 MG PO TABS: 4.0000 mg | ORAL_TABLET | ORAL | Status: DC | PRN

## 2018-08-30 MED ORDER — POLYETHYLENE GLYCOL 3350 17 G PO PACK
17.0000 g | PACK | Freq: Every day | ORAL | Status: DC
  Administered 2018-08-31 – 2018-09-02 (×3): 17 g via ORAL
  Filled 2018-08-30 (×3): qty 1

## 2018-08-30 MED ORDER — HEPARIN SODIUM (PORCINE) 5000 UNIT/ML IJ SOLN
5000.0000 [IU] | Freq: Three times a day (TID) | INTRAMUSCULAR | Status: DC
  Administered 2018-08-30 – 2018-09-02 (×9): 5000 [IU] via SUBCUTANEOUS
  Filled 2018-08-30 (×9): qty 1

## 2018-08-30 MED ORDER — BISACODYL 10 MG RE SUPP: 10.0000 mg | Freq: Every day | RECTAL | Status: DC | PRN

## 2018-08-30 MED ORDER — ACETAMINOPHEN 325 MG PO TABS: 650.0000 mg | ORAL_TABLET | ORAL | Status: DC | PRN

## 2018-08-30 MED ORDER — ACETAMINOPHEN 650 MG RE SUPP: 650.0000 mg | RECTAL | Status: DC | PRN

## 2018-08-30 MED ORDER — POLYVINYL ALCOHOL 1.4 % OP SOLN
1.0000 [drp] | OPHTHALMIC | Status: DC | PRN
  Filled 2018-08-30: qty 15

## 2018-08-30 MED ORDER — MAGIC MOUTHWASH: 5.0000 mL | Freq: Four times a day (QID) | ORAL | 0 refills | Status: DC | PRN

## 2018-08-30 MED ORDER — HYDROCODONE-ACETAMINOPHEN 5-325 MG PO TABS: 1.0000 | ORAL_TABLET | ORAL | Status: DC | PRN

## 2018-08-30 NOTE — Progress Notes (Signed)
A/O self/place, denies pain.  Unsteady gait. Spouse noted pt experienced double vision, pt denies at the time of assessment. Delayed responses. Skin: left cranial sutures, right cranial healed scar, 3 lap sites (abd), and RLE abrasion; all OTA. Educated patient and spouse on call light and not to get up or assist the patient with getting out of bed until therapy evaluate him. Pt has a grounds pass encourage pt/spouse r/t safety do not go off the unit the first day, wait until tomorrow after the interdisciplinary team meet with him on tomorrow. Spouse agreed to the recommendations.

## 2018-08-30 NOTE — Progress Notes (Addendum)
Inpatient Rehabilitation Admissions Coordinator  I am making the arrangements to admit pt to CIR this morning. I met with pt and wife at bedside and they are in agreement. I contacted Dr. Zada Finders by phone.  Danne Baxter, RN, MSN Rehab Admissions Coordinator [phone] 08/30/2018 9:15 AM

## 2018-08-30 NOTE — Plan of Care (Signed)
  Problem: Consults Goal: RH GENERAL PATIENT EDUCATION Description See Patient Education module for education specifics. Outcome: Not Progressing Goal: Skin Care Protocol Initiated - if Braden Score 18 or less Description If consults are not indicated, leave blank or document N/A Outcome: Not Progressing   Problem: RH BOWEL ELIMINATION Goal: RH STG MANAGE BOWEL WITH ASSISTANCE Description STG Manage Bowel with min Assistance.  Outcome: Not Progressing Goal: RH STG MANAGE BOWEL W/MEDICATION W/ASSISTANCE Description STG Manage Bowel with Medication with min Assistance.  Outcome: Not Progressing   Problem: RH BLADDER ELIMINATION Goal: RH STG MANAGE BLADDER WITH ASSISTANCE Description STG Manage Bladder With min Assistance  Outcome: Not Progressing Goal: RH STG MANAGE BLADDER WITH MEDICATION WITH ASSISTANCE Description STG Manage Bladder With Medication With min Assistance.  Outcome: Not Progressing   Problem: RH SKIN INTEGRITY Goal: RH STG SKIN FREE OF INFECTION/BREAKDOWN Outcome: Not Progressing Goal: RH STG MAINTAIN SKIN INTEGRITY WITH ASSISTANCE Description STG Maintain Skin Integrity With mod Assistance.  Outcome: Not Progressing Goal: RH STG ABLE TO PERFORM INCISION/WOUND CARE W/ASSISTANCE Description STG Able To Perform Incision/Wound Care With mod Assistance.  Outcome: Not Progressing   Problem: RH SAFETY Goal: RH STG ADHERE TO SAFETY PRECAUTIONS W/ASSISTANCE/DEVICE Description STG Adhere to Safety Precautions With min Assistance/Device.  Outcome: Not Progressing Goal: RH STG DECREASED RISK OF FALL WITH ASSISTANCE Description STG Decreased Risk of Fall With min Assistance.  Outcome: Not Progressing   Problem: RH PAIN MANAGEMENT Goal: RH STG PAIN MANAGED AT OR BELOW [name] PAIN GOAL Outcome: Not Progressing   Problem: RH KNOWLEDGE DEFICIT GENERAL Goal: RH STG INCREASE KNOWLEDGE OF SELF CARE AFTER HOSPITALIZATION Outcome: Not Progressing   Problem: RH  Vision Goal: RH LTG Vision (Specify) Outcome: Not Progressing  New admit

## 2018-08-30 NOTE — Progress Notes (Signed)
Charles Gong, RN  Rehab Admission Coordinator  Physical Medicine and Rehabilitation  PMR Pre-admission  Signed  Date of Service:  08/30/2018 9:03 AM       Related encounter: ED to Hosp-Admission (Discharged) from 08/25/2018 in [facility]      Signed         Show:Clear all '[x]' Manual'[x]' Template'[x]' Copied  Added by: '[x]' Charles Gong, RN  '[]' Hover for details PMR Admission Coordinator Pre-Admission Assessment  Patient: Charles Marquez is an 62 y.o., male MRN: [id number] DOB: 1956/06/09 Height: '5\' 11"'  (180.3 cm) Weight: 81.2 kg                                                                                                                                                  Insurance Information HMO:     PPO: yes     PCP:      IPA:      80/20:      OTHER:  PRIMARY: [facility]      Policy#: [id number]      Subscriber: pt CM Name: Charles Marquez      Phone#: [phone]     Fax#: [phone] Pre-Cert#: [id number] approved for 7 days and pt and wife aware      Employer: [employer]/laid off 5/19. Insured through 10/12/18 Benefits:  Phone #: [phone]     Name: 08/29/2018 Eff. Date: 11/13/2017     Deduct: $2000/met      Out of Pocket Max: $6750/met      Life Max: none CIR: not a covered benefit. Charles Marquez, CM, at [facility] obtained account executive approval for coverage for 7 days ; typically 80% coverage     SNF: 80% Outpatient: 80%     Co-Pay: 90 visits combined Home Health: 80%      Co-Pay: 90 visits combined DME: 80%     Co-Pay: 20% Providers: in network  SECONDARY: none        Medicaid Application Date:       Case Manager:  Disability Application Date:       Case Worker:   Emergency Contact Information         Contact Information    Name Relation Home Work Mather Spouse [phone]  [phone]   Charles Marquez Daughter [phone]  [phone]     Current Medical History  Patient Admitting Diagnosis: VP  malfunction/infection  History of Present Illness: Charles Marquez a 62 year old right-handed male with history of hyperlipidemia, 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 Patient with recent admission 08/04/2018 for unsteadiness of gait and nausea vomiting findings of malfunction of VP shunt with revision completed 08/05/2018 and discharged home. He was readmitted 08/08/2018 to 08/22/2018 for VP shunt infection organism isolated Klebsiella. He again underwent removal of VP shunt with placement of  external ventriculostomy and completed a 2-week course of IV antibiotics. Follow-up scan reviewed showing right hydrocephalus. Per report, mildly enlarged right lateral ventricle but left ventricle and third ventricle and fourth ventricle were small. Noted generalized decline with unstable gait noted bouts of aphasia. He was again readmitted 08/26/2018 for shunt revision ventricular peritoneal 08/26/2018 per Dr. Annette Stable. Subcutaneous heparin was added for DVT prophylaxis on 08/29/2018. Tolerating a regular diet.   Past Medical History      Past Medical History:  Diagnosis Date  . Anxiety   . Hypercholesteremia   . Stroke ([place])    tia's  . TIA (transient ischemic attack)    09.15    Family History  family history includes Alzheimer's disease in his father; COPD in his mother; Lung cancer in his father.  Prior Rehab/Hospitalizations:  Has the patient had major surgery during 100 days prior to admission? Yes  Current Medications   Current Facility-Administered Medications:  .  acetaminophen (TYLENOL) tablet 650 mg, 650 mg, Oral, Q4H PRN **OR** acetaminophen (TYLENOL) suppository 650 mg, 650 mg, Rectal, Q4H PRN, Pool, Mallie Mussel, MD .  bisacodyl (DULCOLAX) suppository 10 mg, 10 mg, Rectal, Daily PRN, Earnie Larsson, MD .  heparin injection 5,000 Units, 5,000 Units, Subcutaneous, Q8H, Judith Part, MD, 5,000 Units at 08/30/18  [phone] .  HYDROcodone-acetaminophen (NORCO/VICODIN) 5-325 MG per tablet 1 tablet, 1 tablet, Oral, Q4H PRN, Earnie Larsson, MD .  magic mouthwash, 5 mL, Oral, QID PRN, Earnie Larsson, MD, 5 mL at 08/28/18 1838 .  ondansetron (ZOFRAN) tablet 4 mg, 4 mg, Oral, Q4H PRN **OR** [DISCONTINUED] ondansetron (ZOFRAN) injection 4 mg, 4 mg, Intravenous, Q4H PRN, Pool, Henry, MD .  polyethylene glycol (MIRALAX / GLYCOLAX) packet 17 g, 17 g, Oral, Daily, Pool, Mallie Mussel, MD, 17 g at 08/29/18 1204 .  polyvinyl alcohol (LIQUIFILM TEARS) 1.4 % ophthalmic solution 1 drop, 1 drop, Both Eyes, PRN, Pool, Henry, MD .  promethazine (PHENERGAN) tablet 12.5-25 mg, 12.5-25 mg, Oral, Q4H PRN, Earnie Larsson, MD  Patients Current Diet:     Diet Order                  Diet regular Room service appropriate? Yes; Fluid consistency: Thin  Diet effective now               Precautions / Restrictions Precautions Precautions: Fall Precaution Comments: right lateral lean, R LE weakness, R inattention Restrictions Weight Bearing Restrictions: No   Has the patient had 2 or more falls or a fall with injury in the past year?No  Prior Activity Level Limited Community (1-2x/wk):  laid off job 5/19 with [employer]; to begin contract work with [employer] Devices/Equipment: Eyeglasses, [employer] Home Equipment: None  Prior Device Use: Indicate devices/aids used by the patient prior to current illness, exacerbation or injury? None of the above  Prior Functional Level Prior Function Level of Independence: Independent Comments: works in Engineer, technical sales for [employer]; umpires for baseball games. went home with wife at discharge 4 days PTA and was ambulating without an AD.   Self Care: Did the patient need help bathing, dressing, using the toilet or eating?  Independent  Indoor Mobility: Did the patient need assistance with walking from room to room (with or without device)?  Independent  Stairs: Did the patient need assistance with internal or external stairs (with or without device)? Independent  Functional Cognition: Did the patient need help planning regular tasks such as  shopping or remembering to take medications? Independent  Current Functional Level Cognition  Arousal/Alertness: Awake/alert Overall Cognitive Status: Impaired/Different from baseline Current Attention Level: Sustained Orientation Level: Oriented to person, Oriented to time, Disoriented to situation, Disoriented to place Following Commands: Follows one step commands consistently Safety/Judgement: Decreased awareness of safety, Decreased awareness of deficits General Comments: Pt improving with his cognition, but remains impulsive with decreased safety awareness.  He has issues sequencing through a task like washing his hands without cues and does not seem to retain much from session to session despite his improvement (decreased STM).     Extremity Assessment (includes Sensation/Coordination)  Upper Extremity Assessment: RUE deficits/detail RUE Deficits / Details: functional use but clumsy, R inattention; only tested functionally  RUE Coordination: decreased fine motor, decreased gross motor  Lower Extremity Assessment: Defer to PT evaluation RLE Deficits / Details: right leg weakner compared to his left, grossly 3-/5 per seated kicks and gross functional assessment.      ADLs  Overall ADL's : Needs assistance/impaired Eating/Feeding: Set up Grooming: Minimal assistance, Standing Grooming Details (indicate cue type and reason): +2 modA to maintain balance in standing, min assist for oral care Upper Body Bathing: Minimal assistance, Sitting Lower Body Bathing: Moderate assistance, Sit to/from stand, +2 for physical assistance Upper Body Dressing : Minimal assistance, Sitting Lower Body Dressing: Moderate assistance, Sit to/from stand, +2 for physical assistance, +2 for  safety/equipment Toilet Transfer: Moderate assistance, +2 for physical assistance, +2 for safety/equipment, Ambulation, RW Toileting- Clothing Manipulation and Hygiene: Total assistance, +2 for physical assistance, Sit to/from stand Functional mobility during ADLs: Moderate assistance, +2 for physical assistance, +2 for safety/equipment, Rolling walker General ADL Comments: pt completing func mobility into restroom, requires +2 assist for R lateral lean and R inattention    Mobility  Overal bed mobility: Needs Assistance Bed Mobility: Supine to Sit Supine to sit: Min assist, HOB elevated General bed mobility comments: Pt was OOB in the recliner chair.     Transfers  Overall transfer level: Needs assistance Equipment used: Rolling walker (2 wheeled), None Transfers: Sit to/from Stand Sit to Stand: Min assist, +2 physical assistance Stand pivot transfers: +2 physical assistance, Mod assist General transfer comment: Two person min assist for safety and to help control his impulsivity during transitions to stand, still with right lateral lean/list in standing.     Ambulation / Gait / Stairs / Wheelchair Mobility  Ambulation/Gait Ambulation/Gait assistance: +2 physical assistance, Min assist, Mod assist Gait Distance (Feet): 160 Feet Assistive device: Rolling walker (2 wheeled), 2 person hand held assist Gait Pattern/deviations: Step-through pattern, Decreased weight shift to left, Narrow base of support General Gait Details: Pt continues to have right lateral lean, however, less eaggerated today, he needs cues for safety and awareness of obstacles, He needs support at trunk and heavy support to steer RW during gait, so second loop done wih two person hand held assist almost seemed better because he was not struggling to control the walker.  Gait velocity: often too fast to be safe, cues to slow down.     Posture / Balance Dynamic Sitting Balance Sitting balance - Comments: min assist  in sitting with posterior lean, slight R lean  Balance Overall balance assessment: Needs assistance Sitting-balance support: Feet supported, Bilateral upper extremity supported Sitting balance-Leahy Scale: Fair Sitting balance - Comments: min assist in sitting with posterior lean, slight R lean  Postural control: Posterior lean, Right lateral lean Standing balance support: Bilateral upper extremity supported, No upper extremity supported,  Single extremity supported Standing balance-Leahy Scale: Poor Standing balance comment: needs external support.     Special needs/care consideration BiPAP/CPAP  N/a CPM n/a Continuous Drip IV n/a Dialysis n/a Life Vest n/a Oxygen n/a Special Bed n/a Trach Size n/a Wound Vac n/a Skin anterior head surgical incision; abdominal surgical site with liquid adhesive Bowel mgmt: continent LBM 10/16 Bladder mgmt: continent Diabetic mgmt n/a   Previous Home Environment Living Arrangements: Spouse/significant other  Lives With: Spouse Available Help at Discharge: Family, Available 24 hours/day Type of Home: House Home Layout: Two level, 1/2 bath on main level, Bed/bath upstairs(can set up bedroom downstairs with half bath) Alternate Level Stairs-Rails: Left Alternate Level Stairs-Number of Steps: flight  Home Access: Stairs to enter Entrance Stairs-Rails: Can reach both Entrance Stairs-Number of Steps: 5 Bathroom Shower/Tub: Tub/shower unit, Multimedia programmer: Standard Bathroom Accessibility: Yes How Accessible: Accessible via walker Home Care Services: No  Discharge Living Setting Plans for Discharge Living Setting: Patient's home, Lives with (comment)(wife) Type of Home at Discharge: House Discharge Home Layout: Two level, 1/2 bath on main level, Bed/bath upstairs(can set up bedroom downsatirs with half bath downstairs) Alternate Level Stairs-Rails: Left Alternate Level Stairs-Number of Steps: flight Discharge Home Access:  Stairs to enter Entrance Stairs-Rails: Right, Left, Can reach both Entrance Stairs-Number of Steps: 5 Discharge Bathroom Shower/Tub: Tub/shower unit, Walk-in shower Discharge Bathroom Toilet: Standard Discharge Bathroom Accessibility: Yes How Accessible: Accessible via walker Does the patient have any problems obtaining your medications?: No  Social/Family/Support Systems Patient Roles: Medical sales representative) Contact Information: wife, Kim Anticipated Caregiver: wife Anticipated Ambulance person Information: see above Ability/Limitations of Caregiver: wife works but will take [employer] as needed Caregiver Availability: 24/7 Discharge Plan Discussed with Primary Caregiver: Yes Is Caregiver In Agreement with Plan?: Yes Does Caregiver/Family have Issues with Lodging/Transportation while Pt is in Rehab?: No(wife staying with him 24/7 in hospital)  Goals/Additional Needs Patient/Family Goal for Rehab: supervision PT, OT, and SLP Expected length of stay: ELOS 8 to 12 days Pt/Family Agrees to Admission and willing to participate: Yes Program Orientation Provided & Reviewed with Pt/Caregiver Including Roles  & Responsibilities: Yes  Decrease burden of Care through IP rehab admission: n/a  Possible need for SNF placement upon discharge:not anticipated  Patient Condition: This patient's condition remains as documented in the consult dated 08/28/2018, in which the Rehabilitation Physician determined and documented that the patient's condition is appropriate for intensive rehabilitative care in an inpatient rehabilitation facility. Will admit to inpatient rehab today.  Preadmission Screen Completed By:  Cleatrice Burke, 08/30/2018 9:03 AM ______________________________________________________________________   Discussed status with Dr. Naaman Plummer on 08/30/2018 at  0912 and received telephone approval for admission today.  Admission Coordinator:  Cleatrice Burke, time 1957  Date 08/30/2018           Cosigned by: Meredith Staggers, MD at 08/30/2018 10:24 AM  Revision History

## 2018-08-30 NOTE — Progress Notes (Signed)
Marcello Fennel, MD  Physician  Physical Medicine and Rehabilitation  Consult Note  Signed  Date of Service:  08/28/2018 11:35 AM       Related encounter: ED to Hosp-Admission (Discharged) from 08/25/2018 in [facility] NEURO/TRAUMA/SURGICAL ICU      Signed      Expand All Collapse All    Show:Clear all [x] Manual[x] Template[] Copied  Added by: [x] Angiulli, Mcarthur Rossetti, PA-C[x] Allena Katz, Maryln Gottron, MD  [] Hover for details      Physical Medicine and Rehabilitation Consult Reason for Consult: Decreased functional mobility Referring Physician: Dr. Jordan Likes   HPI: Charles Marquez is a 62 y.o. right-handed male with history of hyperlipidemia, 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  Per chart review and wife, patient lives with spouse.  Works in Consulting civil engineer for [employer].  Patient ambulating without assistive device after recent discharge.  2 level home 5 steps to entry.  Patient with recent admission 08/04/2018 for unsteadiness of gait with nausea vomiting findings of malfunction of VP shunt with revision completed 08/05/2018 and discharged to home.  He was readmitted 08/08/2018 to 08/22/2018 for VP shunt infection as well as decrease in functional mobility, right side weakness and new onset of aphasia.  Organism isolated was Klebsiella.  He again underwent removal of VP shunt with placement of external ventriculostomy and completed a 2-week course of IV antibiotics.  Follow-up scan reviewed, showing right hydrocephalus. Per report, mildly enlarged right lateral ventricle but left ventricle and third ventricle and fourth ventricle small.  He was again readmitted 08/26/2018 for shunt revision ventricular peritoneal 08/26/2018 per Dr. Jordan Likes.  Physical therapy evaluation completed 08/27/2018 with recommendations of physical medicine rehab consult.  Review of Systems  Unable to perform ROS: Language       Past Medical History:  Diagnosis  Date  . Anxiety   . Hypercholesteremia   . Stroke (HCC)    tia's  . TIA (transient ischemic attack)    09.15        Past Surgical History:  Procedure Laterality Date  . Fractured arm Left 12  . HERNIA REPAIR Right 3/12  . LAPAROSCOPIC REVISION VENTRICULAR-PERITONEAL (V-P) SHUNT N/A 08/26/2018   Procedure: LAPAROSCOPIC INSERTION VENTRICULAR-PERITONEAL (V-P) SHUNT;  Surgeon: Julio Sicks, MD;  Location: [facility] OR;  Service: Neurosurgery;  Laterality: N/A;  . LOOP RECORDER INSERTION N/A 04/10/2017   Procedure: Loop Recorder Insertion;  Surgeon: Hillis Range, MD;  Location: [facility] INVASIVE CV LAB;  Service: Cardiovascular;  Laterality: N/A;  . SHUNT REMOVAL Right 03/13/2016   Procedure: SHUNT REMOVAL;  Surgeon: Julio Sicks, MD;  Location: [facility] NEURO ORS;  Service: Neurosurgery;  Laterality: Right;  . SHUNT REMOVAL Right 08/09/2018   Procedure: SHUNT REMOVAL With Placement of Ventricular Catheter;  Surgeon: Lisbeth Renshaw, MD;  Location: [facility] OR;  Service: Neurosurgery;  Laterality: Right;  . SHUNT REVISION Right 08/05/2018   Procedure: SHUNT REVISION;  Surgeon: Julio Sicks, MD;  Location: [facility] OR;  Service: Neurosurgery;  Laterality: Right;  . SHUNT REVISION VENTRICULAR-PERITONEAL Left 08/26/2018   Procedure: SHUNT REVISION VENTRICULAR-PERITONEAL;  Surgeon: Julio Sicks, MD;  Location: [facility] OR;  Service: Neurosurgery;  Laterality: Left;  [name] VASECTOMY  10/02/1997  . 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 VENTRICULOPERITONEAL SHUNT Right 07/22/2018   Procedure:  Shunt Placment right occipital;  Surgeon: Julio Sicks, MD;  Location[facility] OR;  Service: Neurosurgery;  Laterality: Right;  . VENTRICULOPERITONEAL SHUNT Left 08/26/2018   Procedure: LEFT SIDED VENTRICULAR-PERITONEAL SHUNT;  Surgeon: Julio Sicks, MD;  Location[facility] OR;  Service: Neurosurgery;  Laterality: Left;  [name] VENTRICULOSTOMY Right 08/09/2018   Procedure: VENTRICULOSTOMY;  Surgeon: Lisbeth Renshaw, MD;  Location: [facility] OR;  Service: Neurosurgery;  Laterality: Right;        Family History  Problem Relation Age of Onset  . COPD Mother   . Lung cancer Father   . Alzheimer's disease Father    Social History:  reports that he has never smoked. He has never used smokeless tobacco. He reports that he drinks alcohol. He reports that he does not use drugs. Allergies: No Known Allergies       Medications Prior to Admission  Medication Sig Dispense Refill  . acetaminophen (TYLENOL) 500 MG tablet Take 500-1,000 mg by mouth every 6 (six) hours as needed for headache (pain).    . Artificial Tear Ointment (DRY EYES OP) Apply 1 drop to eye daily as needed (for drye eyes).    [name] atorvastatin (LIPITOR) 40 MG tablet Take 1 tablet (40 mg total) by mouth daily. 90 tablet 3  . Multiple Vitamins-Minerals (OCUVITE EYE HEALTH FORMULA) CAPS Take 1 capsule by mouth daily.    . ondansetron (ZOFRAN) 4 MG tablet Take 4 mg by mouth 4 (four) times daily as needed for nausea or vomiting.    . sodium chloride (OCEAN) 0.65 % SOLN nasal spray Place 1 spray into both nostrils as needed for congestion.    . traMADol (ULTRAM) 50 MG tablet Take 1 tablet (50 mg total) by mouth every 6 (six) hours as needed. (Patient taking differently: Take 50 mg by mouth at bedtime as needed (pain). ) 30 tablet 1    Home: Home Living Family/patient expects to be discharged to:: Private residence Living Arrangements: Spouse/significant other Available Help at Discharge: Family, Available 24 hours/day Type of Home: House Home Access: Stairs to enter [employer] of Steps: 5 Entrance Stairs-Rails: Can reach both Home Layout: Two level Alternate Level Stairs-Number of Steps: flight  Alternate Level Stairs-Rails: Left Bathroom Shower/Tub:  Tub/shower unit, Health visitor: Administrator Accessibility: Yes Home Equipment: None  Lives With: Spouse  Functional History: Prior Function Level of Independence: Independent 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 went home with wife at discharge 4 days PTA and was ambulating without an AD.  Functional Status:  Mobility: Bed Mobility Overal bed mobility: Needs Assistance Bed Mobility: Supine to Sit Supine to sit: Mod assist, HOB elevated General bed mobility comments: Mod assist to help support trunk and initiate movement of legs to side of bed.  Transfers Overall transfer level: Needs assistance Equipment used: 2 person hand held assist Transfers: Sit to/from Stand, Stand Pivot Transfers Sit to Stand: Mod assist, +2 physical assistance Stand pivot transfers: +2 physical assistance, Mod assist General transfer comment: Two person mod assist to stand to [facility] and stand multiple times from [facility] switching out the [facility] pulling up the recliner chair once he tried to have a BM.   Ambulation/Gait General Gait Details: NT today as significant time was spent trying to get to [facility] and into recliner.  Will likely need to start with two person hand held assist vs two people and the RW.  ADL:  Cognition: Cognition Overall Cognitive Status: Impaired/Different from baseline Arousal/Alertness: Awake/alert Orientation Level:  Other (comment)(dysphagia) Cognition Arousal/Alertness: Awake/alert Behavior During Therapy: Restless Overall Cognitive Status: Impaired/Different from baseline Area of Impairment: Orientation, Attention, Memory, Following commands, Safety/judgement, Awareness, Problem solving Orientation Level: Disoriented to, Place, Time, Situation Current Attention Level: Focused Memory: Decreased recall of precautions, Decreased short-term memory Following Commands: Follows one step commands with increased time, Follows one step commands  inconsistently(25% of commands) Safety/Judgement: Decreased awareness of safety, Decreased awareness of deficits Awareness: Intellectual Problem Solving: Slow processing, Decreased initiation, Difficulty sequencing, Requires verbal cues, Requires tactile cues General Comments: Pt with significant decline in cognition compared to his previous admission, difficulty with processing, initiation, global aphasia, impulsive and restless.  Blood pressure (!) 133/91, pulse 100, temperature 98.9 F (37.2 C), temperature source Oral, resp. rate 16, height 5\' 11"  (1.803 m), weight 81.2 kg, SpO2 99 %. Physical Exam  Vitals reviewed. Constitutional: He appears well-developed and well-nourished.  HENT:  Head: Normocephalic.  VP shunt site clean and dry  Eyes: EOM are normal. Right eye exhibits no discharge. Left eye exhibits no discharge.  Pupils reactive to light  Neck: Normal range of motion. Neck supple. No thyromegaly present.  Cardiovascular: Normal rate and regular rhythm.  Respiratory: Effort normal and breath sounds normal. No respiratory distress.  GI: Soft. Bowel sounds are normal. He exhibits no distension.  Musculoskeletal:  No edema or tenderness in extremities  Neurological: He is alert.  A&Ox0 Patient is aphasic with some spontaneous speech.   He did follow basic commands. Motor: RUE: 3+/5 proximal to distal LUE: 4-/5 proximal to distal RLE: 4/5 proximal to distal LLE: 4+/5 proximal to distal   Skin: Skin is warm and dry.  See above  Psychiatric: His affect is blunt. His speech is delayed. He is slowed.    LabResultsLast24Hours  No results found for this or any previous visit (from the past 24 hour(s)).    ImagingResults(Last48hours)  Ct Head Wo Contrast  Result Date: 08/27/2018 CLINICAL DATA:  Noncommunicating hydrocephalus. Status post VP shunt placement. EXAM: CT HEAD WITHOUT CONTRAST TECHNIQUE: Contiguous axial images were obtained from the base of the  skull through the vertex without intravenous contrast. COMPARISON:  08/26/2018 FINDINGS: Brain: The left parietal approach ventriculostomy catheter has been revised and now courses through the atrium of the left lateral ventricle, crosses the midline, and terminates in the body of the right lateral ventricle. Dilatation of the lateral and third ventricles has mildly decreased. There is new partly linear parenchymal hypoattenuation extending from the left temporoparietal junction anteriorly into the left internal capsule region with gas along this tract which may be related to catheter placement. Periventricular white matter hypoattenuation is similar to the prior study and is compatible transependymal CSF flow. Calcification is again noted in the right parietal lobe deep to a burr hole. No acute intracranial hemorrhage or acute large territory infarct is identified. Vascular: Mild calcified atherosclerosis at the skull base. No hyperdense vessel. Skull: Scalp soft tissue swelling and gas related to VP shunt placement. No acute fracture. Sinuses/Orbits: Paranasal sinuses and mastoid air cells are clear. Unremarkable orbits. Other: None. IMPRESSION: 1. Interval revision of ventriculostomy catheter with mildly improved lateral and third ventriculomegaly. Persistent periventricular hypoattenuation compatible with transependymal CSF flow. 2. Edema and gas extending into the left internal capsule region which may be related to catheter placement. Electronically Signed   By: Sebastian Ache M.D.   On: 08/27/2018 10:08   Ct Head Wo Contrast  Result Date: 08/26/2018 CLINICAL DATA:  Hydrocephalus, noncommunicating EXAM: CT HEAD WITHOUT CONTRAST TECHNIQUE: Contiguous axial  images were obtained from the base of the skull through the vertex without intravenous contrast. COMPARISON:  Yesterday FINDINGS: Brain: VP shunt with a ventriculostomy catheter taking unexpected course through the parenchyma of the left parietal and  temporal lobes, tip at the left middle cranial fossa along the superficial temporal lobe. There is pneumocephalus attributed to catheter placement. Continued marked lateral and third ventriculomegaly consistent with hydrocephalus at the level of the cerebral aqua duct. There is transependymal CSF flow which appears similar to prior. No herniation or infarct seen. No acute hemorrhage. There is sequela of prior right parietal hemorrhage. Vascular: No hyperdense vessel or unexpected calcification. Skull: Gas and swelling related to recent shunt placement. Sinuses/Orbits: Negative 08/26/2018 at 4:30 pm. Dr. Jordan Likes is currently operating. I relayed the message to his MA Henderson [place] who is relaying the message to him in the OR currently. IMPRESSION: 1. VP shunt placement with unexpected course of the ventriculostomy catheter traversing the left cerebral parenchyma and not reaching the ventricular system. 2. Unchanged marked lateral and third ventriculomegaly above cerebral aqua duct obstruction, with transependymal CSF flow. Electronically Signed   By: Marnee Spring M.D.   On: 08/26/2018 16:32     Assessment/Plan: Diagnosis: VP shunt malfunction/infection Labs and images (see above) independently reviewed.  Records reviewed and summated above.  1. Does the need for close, 24 hr/day medical supervision in concert with the patient's rehab needs make it unreasonable for this patient to be served in a less intensive setting? Yes  2. Co-Morbidities requiring supervision/potential complications: hyperlipidemia (cont meds), hx of CVA, HTN (monitor and provide prns in accordance with increased physical exertion and pain), Tachycardia (monitor in accordance with pain and increasing activity), ID (D/c IV Vanc when appropriate), leukocytosis (cont to monitor for signs and symptoms of infection, further workup if indicated), hyponatremia (cont to monitor, treat if necessary) 3. Due to bladder management, bowel management,  skin/wound care, disease management, medication administration and patient education, does the patient require 24 hr/day rehab nursing? Yes 4. Does the patient require coordinated care of a physician, rehab nurse, PT (1-2 hrs/day, 5 days/week), OT (1-2 hrs/day, 5 days/week) and SLP (1-2 hrs/day, 5 days/week) to address physical and functional deficits in the context of the above medical diagnosis(es)? Yes Addressing deficits in the following areas: balance, endurance, locomotion, strength, transferring, bathing, dressing, toileting, cognition, speech, language and psychosocial support 5. Can the patient actively participate in an intensive therapy program of at least 3 hrs of therapy per day at least 5 days per week? Yes 6. The potential for patient to make measurable gains while on inpatient rehab is excellent 7. Anticipated functional outcomes upon discharge from inpatient rehab are supervision  with PT, supervision with OT, supervision with SLP. 8. Estimated rehab length of stay to reach the above functional goals is: 8-12 days. 9. Anticipated D/C setting: Home 10. Anticipated post D/C treatments: HH therapy and Home excercise program 11. Overall Rehab/Functional Prognosis: excellent and good  RECOMMENDATIONS: This patient's condition is appropriate for continued rehabilitative care in the following setting: CIR when medically stable Patient has agreed to participate in recommended program. Potentially Note that insurance prior authorization may be required for reimbursement for recommended care.  Comment: Rehab Admissions Coordinator to follow up.   I have personally performed a face to face diagnostic evaluation, including, but not limited to relevant history and physical exam findings, of this patient and developed relevant assessment and plan.  Additionally, I have reviewed and concur with the physician assistant's documentation above.  Maryla Morrow, MD, ABPMR Mcarthur Rossetti Angiulli,  PA-C 08/28/2018        Revision History                        Routing History

## 2018-08-30 NOTE — Discharge Instructions (Signed)
Discharge Instructions  No restriction in activities, slowly increase your activity back to normal.   Okay to remove dressings and shower on the day of discharge. Be gentle when cleaning your incision. Use regular soap and water. If that is uncomfortable, try using baby shampoo. Do not submerge the wound under water for 2 weeks after surgery.  If you have any new concerns or questions, please call the office at [phone] and let your surgeon know.

## 2018-08-30 NOTE — Progress Notes (Signed)
Physical Therapy Treatment Patient Details Name: Charles Marquez MRN: [id number] DOB: 10/29/1956 Today's Date: 08/30/2018    History of Present Illness 62 y.o. male admitted on 08/25/18 for increased confusion and gait instability. 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 He was recently d/c home from [facility] on 08/22/18.      PT Comments    Pt continues to make daily progress with right lateral lean diminishing.  We started gait with one person hand held assist and then progressed to one person assist from behind at his trunk so his hands were free.  Wife present and participating.  He is due to d/c to CIR today.   Follow Up Recommendations  Supervision/Assistance - 24 hour;CIR     Equipment Recommendations  3in1 (PT)    Recommendations for Other Services   NA     Precautions / Restrictions Precautions Precautions: Fall Precaution Comments: right lateral lean, R LE weakness, R inattention    Mobility  Bed Mobility               General bed mobility comments: Pt was OOB in the recliner chair.   Transfers Overall transfer level: Needs assistance   Transfers: Sit to/from Stand Sit to Stand: Min assist         General transfer comment: Min assist to come to standing, immediate right lateral lean  Ambulation/Gait Ambulation/Gait assistance: Min assist Gait Distance (Feet): 150 Feet Assistive device: None Gait Pattern/deviations: Step-through pattern;Decreased weight shift to left;Narrow base of support Gait velocity: much slower and safer today.    General Gait Details: Pt continues to have right lateral lean, but less than previous sessions, walked without assistive device with therapist on his L providing assist for balance and wife on his R to ensure that LOB are not big enough to pull pt and therapist over          Balance Overall balance  assessment: Needs assistance Sitting-balance support: Feet supported;No upper extremity supported Sitting balance-Leahy Scale: Good     Standing balance support: Bilateral upper extremity supported Standing balance-Leahy Scale: Poor Standing balance comment: needs external assist in standing.                             Cognition Arousal/Alertness: Awake/alert Behavior During Therapy: WFL for tasks assessed/performed Overall Cognitive Status: Impaired/Different from baseline Area of Impairment: Attention;Memory;Following commands;Safety/judgement;Awareness;Problem solving                   Current Attention Level: Selective Memory: Decreased recall of precautions;Decreased short-term memory Following Commands: Follows one step commands consistently Safety/Judgement: Decreased awareness of safety;Decreased awareness of deficits Awareness: Intellectual Problem Solving: Difficulty sequencing;Requires verbal cues;Requires tactile cues General Comments: Pt continues to make daily improvements, however, awareness of deficits and awareness of safety are still significantly impaired.  He is less impulsive than before and waiting for therapist to instruct him before moving.       Exercises Other Exercises Other Exercises: Standing weight shfits side to side and forward and back.         Pertinent Vitals/Pain Pain Assessment: No/denies pain Pain Score: 0-No pain           PT Goals (current goals can now be found in the care plan section) Acute Rehab PT Goals Patient Stated Goal: to get back to normal, and stop coming to the  hospital Progress towards PT goals: Progressing toward goals    Frequency    Min 3X/week      PT Plan Current plan remains appropriate       AM-PAC PT "6 Clicks" Daily Activity  Outcome Measure  Difficulty turning over in bed (including adjusting bedclothes, sheets and blankets)?: A Little Difficulty moving from lying on back to  sitting on the side of the bed? : A Little Difficulty sitting down on and standing up from a chair with arms (e.g., wheelchair, bedside commode, etc,.)?: Unable Help needed moving to and from a bed to chair (including a wheelchair)?: A Little Help needed walking in hospital room?: A Little Help needed climbing 3-5 steps with a railing? : A Lot 6 Click Score: 15    End of Session Equipment Utilized During Treatment: Gait belt Activity Tolerance: Patient limited by fatigue Patient left: in chair;with call bell/phone within reach;with family/visitor present   PT Visit Diagnosis: Unsteadiness on feet (R26.81);Difficulty in walking, not elsewhere classified (R26.2);Other symptoms and signs involving the nervous system (N82.956)     Time: 2130-8657 PT Time Calculation (min) (ACUTE ONLY): 14 min  Charges:  $Gait Training: 8-22 mins                    Toye Rouillard B. Zamorah Ailes, PT, DPT  Acute Rehabilitation [phone] pager #[phone] office   08/30/2018, 6:30 PM

## 2018-08-30 NOTE — PMR Pre-admission (Signed)
PMR Admission Coordinator Pre-Admission Assessment  Patient: Charles Marquez is an 62 y.o., male MRN: [id number] DOB: March 10, 1956 Height: _0  (180.3 cm) Weight: 81.2 kg              Insurance Information HMO:     PPO: yes     PCP:      IPA:      80/20:      OTHER:  PRIMARY: [employer]      Policy#: [id number]      Subscriber: pt CM Name: Katharine Look      Phone#: [phone]     Fax#: [phone] Pre-Cert#: [id number] approved for 7 days and pt and wife aware      Employer: [employer]/laid off 5/19. Insured through 10/12/18 Benefits:  Phone #: [phone]     Name: 08/29/2018 Eff. Date: 11/13/2017     Deduct: $2000/met      Out of Pocket Max: $6750/met      Life Max: none CIR: not a covered benefit. Katharine Look, CM, at [facility] obtained account executive approval for coverage for 7 days ; typically 80% coverage     SNF: 80% Outpatient: 80%     Co-Pay: 90 visits combined Home Health: 80%      Co-Pay: 90 visits combined DME: 80%     Co-Pay: 20% Providers: in network  SECONDARY: none        Medicaid Application Date:       Case Manager:  Disability Application Date:       Case Worker:   Emergency Contact Information Contact Information    Name Relation Home Work Barnard Spouse [phone]  [phone]   Bari Edward Daughter [phone]  [phone]     Current Medical History  Patient Admitting Diagnosis: VP malfunction/infection  History of Present Illness: Charles Marquez is a 62 year old right-handed male with history of hyperlipidemia, 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  Patient with recent admission 08/04/2018 for unsteadiness of gait and nausea vomiting findings of malfunction of VP shunt with revision completed 08/05/2018 and discharged home.  He was readmitted 08/08/2018 to 08/22/2018 for VP shunt infection organism isolated Klebsiella.  He again underwent removal of VP shunt with placement of external  ventriculostomy and completed a 2-week course of IV antibiotics.  Follow-up scan reviewed showing right hydrocephalus.  Per report, mildly enlarged right lateral ventricle but left ventricle and third ventricle and fourth ventricle were small.  Noted generalized decline with unstable gait noted bouts of aphasia.  He was again readmitted 08/26/2018 for shunt revision ventricular peritoneal 08/26/2018 per Dr. Annette Stable.  Subcutaneous heparin was added for DVT prophylaxis on 08/29/2018.  Tolerating a regular diet.    Past Medical History  Past Medical History:  Diagnosis Date  . Anxiety   . Hypercholesteremia   . Stroke ([place])    tia's  . TIA (transient ischemic attack)    09.15    Family History  family history includes Alzheimer's disease in his father; COPD in his mother; Lung cancer in his father.  Prior Rehab/Hospitalizations:  Has the patient had major surgery during 100 days prior to admission? Yes  Current Medications   Current Facility-Administered Medications:  .  acetaminophen (TYLENOL) tablet 650 mg, 650 mg, Oral, Q4H PRN **OR** acetaminophen (TYLENOL) suppository 650 mg, 650 mg, Rectal, Q4H PRN, Pool, Mallie Mussel, MD .  bisacodyl (DULCOLAX) suppository 10 mg, 10 mg, Rectal, Daily PRN, Earnie Larsson, MD .  heparin  injection 5,000 Units, 5,000 Units, Subcutaneous, Q8H, Judith Part, MD, 5,000 Units at 08/30/18 [phone] .  HYDROcodone-acetaminophen (NORCO/VICODIN) 5-325 MG per tablet 1 tablet, 1 tablet, Oral, Q4H PRN, Earnie Larsson, MD .  magic mouthwash, 5 mL, Oral, QID PRN, Earnie Larsson, MD, 5 mL at 08/28/18 1838 .  ondansetron (ZOFRAN) tablet 4 mg, 4 mg, Oral, Q4H PRN **OR** [DISCONTINUED] ondansetron (ZOFRAN) injection 4 mg, 4 mg, Intravenous, Q4H PRN, Pool, Henry, MD .  polyethylene glycol (MIRALAX / GLYCOLAX) packet 17 g, 17 g, Oral, Daily, Pool, Mallie Mussel, MD, 17 g at 08/29/18 1204 .  polyvinyl alcohol (LIQUIFILM TEARS) 1.4 % ophthalmic solution 1 drop, 1 drop, Both Eyes, PRN, Pool, Henry,  MD .  promethazine (PHENERGAN) tablet 12.5-25 mg, 12.5-25 mg, Oral, Q4H PRN, Earnie Larsson, MD  Patients Current Diet:  Diet Order            Diet regular Room service appropriate? Yes; Fluid consistency: Thin  Diet effective now              Precautions / Restrictions Precautions Precautions: Fall Precaution Comments: right lateral lean, R LE weakness, R inattention Restrictions Weight Bearing Restrictions: No   Has the patient had 2 or more falls or a fall with injury in the past year?No  Prior Activity Level Limited Community (1-2x/wk):  laid off job 5/19 with [employer]; to begin contract work with [employer] / [place] Devices/Equipment: Eyeglasses, [employer] Home Equipment: None  Prior Device Use: Indicate devices/aids used by the patient prior to current illness, exacerbation or injury? None of the above  Prior Functional Level Prior Function Level of Independence: Independent Comments: works in Engineer, technical sales for [employer]; umpires for baseball games. went home with wife at discharge 4 days PTA and was ambulating without an AD.   Self Care: Did the patient need help bathing, dressing, using the toilet or eating?  Independent  Indoor Mobility: Did the patient need assistance with walking from room to room (with or without device)? Independent  Stairs: Did the patient need assistance with internal or external stairs (with or without device)? Independent  Functional Cognition: Did the patient need help planning regular tasks such as shopping or remembering to take medications? Independent  Current Functional Level Cognition  Arousal/Alertness: Awake/alert Overall Cognitive Status: Impaired/Different from baseline Current Attention Level: Sustained Orientation Level: Oriented to person, Oriented to time, Disoriented to situation, Disoriented to place Following Commands: Follows one step commands consistently Safety/Judgement:  Decreased awareness of safety, Decreased awareness of deficits General Comments: Pt improving with his cognition, but remains impulsive with decreased safety awareness.  He has issues sequencing through a task like washing his hands without cues and does not seem to retain much from session to session despite his improvement (decreased STM).     Extremity Assessment (includes Sensation/Coordination)  Upper Extremity Assessment: RUE deficits/detail RUE Deficits / Details: functional use but clumsy, R inattention; only tested functionally  RUE Coordination: decreased fine motor, decreased gross motor  Lower Extremity Assessment: Defer to PT evaluation RLE Deficits / Details: right leg weakner compared to his left, grossly 3-/5 per seated kicks and gross functional assessment.      ADLs  Overall ADL's : Needs assistance/impaired Eating/Feeding: Set up Grooming: Minimal assistance, Standing Grooming Details (indicate cue type and reason): +2 modA to maintain balance in standing, min assist for oral care Upper Body Bathing: Minimal assistance, Sitting Lower Body Bathing: Moderate assistance, Sit to/from stand, +2  for physical assistance Upper Body Dressing : Minimal assistance, Sitting Lower Body Dressing: Moderate assistance, Sit to/from stand, +2 for physical assistance, +2 for safety/equipment Toilet Transfer: Moderate assistance, +2 for physical assistance, +2 for safety/equipment, Ambulation, RW Toileting- Clothing Manipulation and Hygiene: Total assistance, +2 for physical assistance, Sit to/from stand Functional mobility during ADLs: Moderate assistance, +2 for physical assistance, +2 for safety/equipment, Rolling walker General ADL Comments: pt completing func mobility into restroom, requires +2 assist for R lateral lean and R inattention    Mobility  Overal bed mobility: Needs Assistance Bed Mobility: Supine to Sit Supine to sit: Min assist, HOB elevated General bed mobility  comments: Pt was OOB in the recliner chair.     Transfers  Overall transfer level: Needs assistance Equipment used: Rolling walker (2 wheeled), None Transfers: Sit to/from Stand Sit to Stand: Min assist, +2 physical assistance Stand pivot transfers: +2 physical assistance, Mod assist General transfer comment: Two person min assist for safety and to help control his impulsivity during transitions to stand, still with right lateral lean/list in standing.     Ambulation / Gait / Stairs / Wheelchair Mobility  Ambulation/Gait Ambulation/Gait assistance: +2 physical assistance, Min assist, Mod assist Gait Distance (Feet): 160 Feet Assistive device: Rolling walker (2 wheeled), 2 person hand held assist Gait Pattern/deviations: Step-through pattern, Decreased weight shift to left, Narrow base of support General Gait Details: Pt continues to have right lateral lean, however, less eaggerated today, he needs cues for safety and awareness of obstacles, He needs support at trunk and heavy support to steer RW during gait, so second loop done wih two person hand held assist almost seemed better because he was not struggling to control the walker.  Gait velocity: often too fast to be safe, cues to slow down.     Posture / Balance Dynamic Sitting Balance Sitting balance - Comments: min assist in sitting with posterior lean, slight R lean  Balance Overall balance assessment: Needs assistance Sitting-balance support: Feet supported, Bilateral upper extremity supported Sitting balance-Leahy Scale: Fair Sitting balance - Comments: min assist in sitting with posterior lean, slight R lean  Postural control: Posterior lean, Right lateral lean Standing balance support: Bilateral upper extremity supported, No upper extremity supported, Single extremity supported Standing balance-Leahy Scale: Poor Standing balance comment: needs external support.     Special needs/care consideration BiPAP/CPAP  N/a CPM  n/a Continuous Drip IV n/a Dialysis n/a Life Vest n/a Oxygen n/a Special Bed n/a Trach Size n/a Wound Vac n/a Skin anterior head surgical incision; abdominal surgical site with liquid adhesive Bowel mgmt: continent LBM 10/16 Bladder mgmt: continent Diabetic mgmt n/a   Previous Home Environment Living Arrangements: Spouse/significant other  Lives With: Spouse Available Help at Discharge: Family, Available 24 hours/day Type of Home: House Home Layout: Two level, 1/2 bath on main level, Bed/bath upstairs(can set up bedroom downstairs with half bath) Alternate Level Stairs-Rails: Left Alternate Level Stairs-Number of Steps: flight  Home Access: Stairs to enter Entrance Stairs-Rails: Can reach both Entrance Stairs-Number of Steps: 5 Bathroom Shower/Tub: Tub/shower unit, Multimedia programmer: Standard Bathroom Accessibility: Yes How Accessible: Accessible via walker Home Care Services: No  Discharge Living Setting Plans for Discharge Living Setting: Patient's home, Lives with (comment)(wife) Type of Home at Discharge: House Discharge Home Layout: Two level, 1/2 bath on main level, Bed/bath upstairs(can set up bedroom downsatirs with half bath downstairs) Alternate Level Stairs-Rails: Left Alternate Level Stairs-Number of Steps: flight Discharge Home Access: Stairs to enter Entrance Stairs-Rails: Right,  Left, Can reach both Entrance Stairs-Number of Steps: 5 Discharge Bathroom Shower/Tub: Tub/shower unit, Walk-in shower Discharge Bathroom Toilet: Standard Discharge Bathroom Accessibility: Yes How Accessible: Accessible via walker Does the patient have any problems obtaining your medications?: No  Social/Family/Support Systems Patient Roles: Medical sales representative) Contact Information: wife, Kim Anticipated Caregiver: wife Anticipated Ambulance person Information: see above Ability/Limitations of Caregiver: wife works but will take [employer] as needed Caregiver  Availability: 24/7 Discharge Plan Discussed with Primary Caregiver: Yes Is Caregiver In Agreement with Plan?: Yes Does Caregiver/Family have Issues with Lodging/Transportation while Pt is in Rehab?: No(wife staying with him 24/7 in hospital)  Goals/Additional Needs Patient/Family Goal for Rehab: supervision PT, OT, and SLP Expected length of stay: ELOS 8 to 12 days Pt/Family Agrees to Admission and willing to participate: Yes Program Orientation Provided & Reviewed with Pt/Caregiver Including Roles  & Responsibilities: Yes  Decrease burden of Care through IP rehab admission: n/a  Possible need for SNF placement upon discharge:not anticipated  Patient Condition: This patient's condition remains as documented in the consult dated 08/28/2018, in which the Rehabilitation Physician determined and documented that the patient's condition is appropriate for intensive rehabilitative care in an inpatient rehabilitation facility. Will admit to inpatient rehab today.  Preadmission Screen Completed By:  Cleatrice Burke, 08/30/2018 9:03 AM ______________________________________________________________________   Discussed status with Dr. Naaman Plummer on 08/30/2018 at  0912 and received telephone approval for admission today.  Admission Coordinator:  Cleatrice Burke, time 2993 Date 08/30/2018

## 2018-08-30 NOTE — Progress Notes (Signed)
Neurosurgery Service Progress Note  Subjective: No acute events overnight, speech and motor functions continue to improve   Objective: Vitals:   08/30/18 0600 08/30/18 0700 08/30/18 0800 08/30/18 0900  BP: 140/80 110/80 (!) 132/92 107/82  Pulse: 68 62 68 67  Resp: 13 13 15 14   Temp:   98.1 F (36.7 C)   TempSrc:   Oral   SpO2: 95% 95% 96% 97%  Weight:      Height:       Temp (24hrs), Avg:98.2 F (36.8 C), Min:97.6 F (36.4 C), Max:98.7 F (37.1 C)  CBC Latest Ref Rng & Units 08/25/2018 08/25/2018 08/12/2018  WBC 4.0 - 10.5 K/uL 14.2(H) 12.7(H) 8.3  Hemoglobin 13.0 - 17.0 g/dL 16.1 09.6 12.1(L)  Hematocrit 39.0 - 52.0 % 39.0 41.0 35.2(L)  Platelets 150 - 400 K/uL 116(L) 191 185   BMP Latest Ref Rng & Units 08/25/2018 08/25/2018 08/19/2018  Glucose 70 - 99 mg/dL 045(W) 098(J) 191(Y)  BUN 6 - 20 mg/dL 20 20 17   Creatinine 0.61 - 1.24 mg/dL 7.82 9.56 2.13  Sodium 135 - 145 mmol/L 128(L) 127(L) 138  Potassium 3.5 - 5.1 mmol/L 3.9 4.0 4.5  Chloride 98 - 111 mmol/L 96(L) 96(L) 104  CO2 22 - 32 mmol/L 22 22 27   Calcium 8.9 - 10.3 mg/dL 0.8(M) 5.7(Q) 4.6(N)    Intake/Output Summary (Last 24 hours) at 08/30/2018 1131 Last data filed at 08/30/2018 1015 Gross per 24 hour  Intake 600 ml  Output 1600 ml  Net -1000 ml    Current Facility-Administered Medications:  .  acetaminophen (TYLENOL) tablet 650 mg, 650 mg, Oral, Q4H PRN **OR** acetaminophen (TYLENOL) suppository 650 mg, 650 mg, Rectal, Q4H PRN, Pool, Sherilyn Cooter, MD .  bisacodyl (DULCOLAX) suppository 10 mg, 10 mg, Rectal, Daily PRN, Julio Sicks, MD .  heparin injection 5,000 Units, 5,000 Units, Subcutaneous, Q8H, Jadene Pierini, MD, 5,000 Units at 08/30/18 0605 .  HYDROcodone-acetaminophen (NORCO/VICODIN) 5-325 MG per tablet 1 tablet, 1 tablet, Oral, Q4H PRN, Julio Sicks, MD .  magic mouthwash, 5 mL, Oral, QID PRN, Julio Sicks, MD, 5 mL at 08/28/18 1838 .  ondansetron (ZOFRAN) tablet 4 mg, 4 mg, Oral, Q4H PRN **OR**  [DISCONTINUED] ondansetron (ZOFRAN) injection 4 mg, 4 mg, Intravenous, Q4H PRN, Pool, Henry, MD .  polyethylene glycol (MIRALAX / GLYCOLAX) packet 17 g, 17 g, Oral, Daily, Pool, Sherilyn Cooter, MD, 17 g at 08/30/18 1008 .  polyvinyl alcohol (LIQUIFILM TEARS) 1.4 % ophthalmic solution 1 drop, 1 drop, Both Eyes, PRN, Pool, Henry, MD .  promethazine (PHENERGAN) tablet 12.5-25 mg, 12.5-25 mg, Oral, Q4H PRN, Julio Sicks, MD   Physical Exam: Awake/alert, speech with short responses but appropriate, repetition intact, +occasional paraphasic errors, Strength 5/5, no drift today Incisions c/d/i  Assessment & Plan: 62 y.o. man with complicated hydrocephalus s/p shunt revision, recovering well. -discharge to CIR today -activity as tolerated -SCDs/TEDs, SQH  Charles Marquez  08/30/18 11:31 AM

## 2018-08-30 NOTE — H&P (Signed)
Physical Medicine and Rehabilitation Admission H&P       Chief Complaint  Patient presents with  . Gait Problem  . Altered Mental Status  : HPI: Charles Marquez is a 62 year old right-handed male with history of hyperlipidemia, 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  Per chart review and wife, patient lives with spouse.  Works in Consulting civil engineer for [employer].  Patient ambulating without assistive device after recent discharge.  Two-level home 5 steps to entry.  Patient with recent admission 08/04/2018 for unsteadiness of gait and nausea vomiting findings of malfunction of VP shunt with revision completed 08/05/2018 and discharged home.  He was readmitted 08/08/2018 to 08/22/2018 for VP shunt infection organism isolated Klebsiella.  He again underwent removal of VP shunt with placement of external ventriculostomy and completed a 2-week course of IV antibiotics.  Follow-up scan reviewed showing right hydrocephalus.  Per report, mildly enlarged right lateral ventricle but left ventricle and third ventricle and fourth ventricle were small.  Noted generalized decline with unstable gait noted bouts of aphasia.  He was again readmitted 08/26/2018 for shunt revision ventricular peritoneal 08/26/2018 per Dr. Jordan Likes.  Subcutaneous heparin was added for DVT prophylaxis on 08/29/2018.  Tolerating a regular diet.  Therapy evaluations completed with recommendations of physical medicine rehab consult.  Patient was admitted for a comprehensive rehab program.  Review of Systems  Unable to perform ROS: Language       Past Medical History:  Diagnosis Date  . Anxiety   . Hypercholesteremia   . Stroke (HCC)    tia's  . TIA (transient ischemic attack)    09.15        Past Surgical History:  Procedure Laterality Date  . Fractured arm Left 12  . HERNIA REPAIR Right 3/12  . LAPAROSCOPIC REVISION VENTRICULAR-PERITONEAL (V-P) SHUNT N/A 08/26/2018    Procedure: LAPAROSCOPIC INSERTION VENTRICULAR-PERITONEAL (V-P) SHUNT;  Surgeon: Julio Sicks, MD;  Location: [facility] OR;  Service: Neurosurgery;  Laterality: N/A;  . LOOP RECORDER INSERTION N/A 04/10/2017   Procedure: Loop Recorder Insertion;  Surgeon: Hillis Range, MD;  Location: [facility];  Service: Cardiovascular;  Laterality: N/A;  . SHUNT REMOVAL Right 03/13/2016   Procedure: SHUNT REMOVAL;  Surgeon: Julio Sicks, MD;  Location: [facility]UR[facility] ORS;  Service: Neurosurgery;  Laterality: Right;  . SHUNT REMOVAL Right 08/09/2018   Procedure: SHUNT REMOVAL With Placement of Ventricular Catheter;  Surgeon: Lisbeth Renshaw, MD;  Location: [facility] OR;  Service: Neurosurgery;  Laterality: Right;  . SHUNT REVISION Right 08/05/2018   Procedure: SHUNT REVISION;  Surgeon: Julio Sicks, MD;  Location: [facility] OR;  Service: Neurosurgery;  Laterality: Right;  . SHUNT REVISION VENTRICULAR-PERITONEAL Left 08/26/2018   Procedure: SHUNT REVISION VENTRICULAR-PERITONEAL;  Surgeon: Julio Sicks, MD;  Location: [facility] OR;  Service: Neurosurgery;  Laterality: Left;  [name] VASECTOMY  10/02/1997  . 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 VENTRICULOPERITONEAL SHUNT Right 07/22/2018   Procedure: Shunt Placment right occipital;  Surgeon: Julio Sicks, MD;  Location: [facility] OR;  Service: Neurosurgery;  Laterality: Right;  . VENTRICULOPERITONEAL SHUNT Left 08/26/2018   Procedure: LEFT SIDED VENTRICULAR-PERITONEAL SHUNT;  Surgeon: Julio Sicks, MD;  Location: [facility] OR;  Service: Neurosurgery;  Laterality: Left;  [name] VENTRICULOSTOMY Right 08/09/2018  Procedure: VENTRICULOSTOMY;  Surgeon: Lisbeth Renshaw, MD;  Location: [facility] OR;  Service: Neurosurgery;  Laterality: Right;        Family History  Problem Relation Age of Onset  . COPD Mother   . Lung  cancer Father   . Alzheimer's disease Father    Social History:  reports that he has never smoked. He has never used smokeless tobacco. He reports that he drinks alcohol. He reports that he does not use drugs. Allergies: No Known Allergies       Medications Prior to Admission  Medication Sig Dispense Refill  . acetaminophen (TYLENOL) 500 MG tablet Take 500-1,000 mg by mouth every 6 (six) hours as needed for headache (pain).    . Artificial Tear Ointment (DRY EYES OP) Apply 1 drop to eye daily as needed (for drye eyes).    [name] atorvastatin (LIPITOR) 40 MG tablet Take 1 tablet (40 mg total) by mouth daily. 90 tablet 3  . Multiple Vitamins-Minerals (OCUVITE EYE HEALTH FORMULA) CAPS Take 1 capsule by mouth daily.    . ondansetron (ZOFRAN) 4 MG tablet Take 4 mg by mouth 4 (four) times daily as needed for nausea or vomiting.    . sodium chloride (OCEAN) 0.65 % SOLN nasal spray Place 1 spray into both nostrils as needed for congestion.    . traMADol (ULTRAM) 50 MG tablet Take 1 tablet (50 mg total) by mouth every 6 (six) hours as needed. (Patient taking differently: Take 50 mg by mouth at bedtime as needed (pain). ) 30 tablet 1    Drug Regimen Review Drug regimen was reviewed and remains appropriate with no significant issues identified  Home: Home Living Family/patient expects to be discharged to:: Private residence Living Arrangements: Spouse/significant other Available Help at Discharge: Family, Available 24 hours/day Type of Home: House Home Access: Stairs to enter [employer] of Steps: 5 Entrance Stairs-Rails: Can reach both Home Layout: Two level Alternate Level Stairs-Number of Steps: flight  Alternate Level Stairs-Rails: Left Bathroom Shower/Tub: Tub/shower unit, Health visitor: Administrator Accessibility: Yes Home Equipment: None  Lives With: Spouse   Functional History: Prior Function Level of Independence:  Independent 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 went home with wife at discharge 4 days PTA and was ambulating without an AD.   Functional Status:  Mobility: Bed Mobility Overal bed mobility: Needs Assistance Bed Mobility: Supine to Sit Supine to sit: Min assist, HOB elevated General bed mobility comments: Min assist to support trunk and help initiate movement to the right side of the bed today.  Transfers Overall transfer level: Needs assistance Equipment used: Rolling walker (2 wheeled) Transfers: Sit to/from Stand Sit to Stand: Mod assist, +2 physical assistance Stand pivot transfers: +2 physical assistance, Mod assist General transfer comment: Mod assist +2 to come to stand initially without an AD, then again with RW.  Assist needed to support trunk due to posterior and right lateral bias which increased with fatigue and repetition.  Manual and verbal cues for safe hand placement during transitions.  Ambulation/Gait Ambulation/Gait assistance: +2 physical assistance, Mod assist Gait Distance (Feet): 10 Feet Assistive device: Rolling walker (2 wheeled) Gait Pattern/deviations: Step-through pattern, Decreased weight shift to left, Narrow base of support General Gait Details: RW helped decrease posterior bias, but as he continued to be upright, his right lateral bias with decreased strength at R quad, assist needed to help support his trunk to bring him to midline and manually keep his base wider.  ADL: ADL Overall ADL's : Needs assistance/impaired Eating/Feeding: Set up Grooming: Minimal assistance, Standing Grooming Details (indicate cue type and reason): +2 modA to maintain balance in standing, min assist for oral care Upper Body Bathing: Minimal assistance, Sitting Lower Body Bathing: Moderate assistance, Sit to/from stand, +2 for physical assistance Upper Body Dressing : Minimal assistance, Sitting Lower Body Dressing: Moderate assistance, Sit  to/from stand, +2 for physical assistance, +2 for safety/equipment Toilet Transfer: Moderate assistance, +2 for physical assistance, +2 for safety/equipment, Ambulation, RW Toileting- Clothing Manipulation and Hygiene: Total assistance, +2 for physical assistance, Sit to/from stand Functional mobility during ADLs: Moderate assistance, +2 for physical assistance, +2 for safety/equipment, Rolling walker General ADL Comments: pt completing func mobility into restroom, requires +2 assist for R lateral lean and R inattention  Cognition: Cognition Overall Cognitive Status: Impaired/Different from baseline Arousal/Alertness: Awake/alert Orientation Level: Oriented to person, Oriented to place, Oriented to time, Disoriented to situation Cognition Arousal/Alertness: Awake/alert Behavior During Therapy: WFL for tasks assessed/performed Overall Cognitive Status: Impaired/Different from baseline Area of Impairment: Attention, Memory, Following commands, Safety/judgement, Awareness, Problem solving Orientation Level: Disoriented to, Place, Time, Situation Current Attention Level: Sustained Memory: Decreased recall of precautions Following Commands: Follows one step commands inconsistently Safety/Judgement: Decreased awareness of safety, Decreased awareness of deficits Awareness: Intellectual Problem Solving: Difficulty sequencing, Requires verbal cues, Requires tactile cues General Comments: Pt continues to have decreased awareness and safety, language remains automatic.  Physical Exam: Blood pressure 139/77, pulse (!) 47, temperature 97.7 F (36.5 C), temperature source Axillary, resp. rate 14, height 5\' 11"  (1.803 m), weight 81.2 kg, SpO2 97 %. Physical Exam  Constitutional: No distress.  HENT:  Head: Normocephalic.  Eyes: Pupils are equal, round, and reactive to light.  Neck: Normal range of motion.  Cardiovascular: Normal rate and regular rhythm.  Respiratory: Effort normal.  GI: Soft.  There is tenderness.  Musculoskeletal: He exhibits no edema.  Neurological: He is alert.  Pt alert. Spontaneous speech. Delayed processing. Language intact. Follows basic commands. Can answer some simple biographical questions. Moves all 4's 4 to 4+/5. No sensory findings. .   Skin: Skin is warm. He is not diaphoretic.  Scalp, abdominal incisions clean    LabResultsLast48Hours        Results for orders placed or performed during the hospital encounter of 08/25/18 (from the past 48 hour(s))  Vancomycin, trough     Status: Abnormal   Collection Time: 08/29/18  3:44 AM  Result Value Ref Range   Vancomycin Tr 7 (L) 15 - 20 ug/mL    Comment: Performed at [facility], [address], [place], [place] [address]      ImagingResults(Last48hours)  Ct Head Wo Contrast  Result Date: 08/27/2018 CLINICAL DATA:  Noncommunicating hydrocephalus. Status post VP shunt placement. EXAM: CT HEAD WITHOUT CONTRAST TECHNIQUE: Contiguous axial images were obtained from the base of the skull through the vertex without intravenous contrast. COMPARISON:  08/26/2018 FINDINGS: Brain: The left parietal approach ventriculostomy catheter has been revised and now courses through the atrium of the left lateral ventricle, crosses the midline, and terminates in the body of the right lateral ventricle. Dilatation of the lateral and third ventricles has mildly decreased. There is new partly linear parenchymal hypoattenuation extending from the left temporoparietal junction anteriorly into the left internal capsule region with gas along this tract which may be related to catheter placement. Periventricular white matter hypoattenuation is similar to the prior study and is compatible transependymal CSF flow. Calcification is again noted in  the right parietal lobe deep to a burr hole. No acute intracranial hemorrhage or acute large territory infarct is identified. Vascular: Mild calcified atherosclerosis at the  skull base. No hyperdense vessel. Skull: Scalp soft tissue swelling and gas related to VP shunt placement. No acute fracture. Sinuses/Orbits: Paranasal sinuses and mastoid air cells are clear. Unremarkable orbits. Other: None. IMPRESSION: 1. Interval revision of ventriculostomy catheter with mildly improved lateral and third ventriculomegaly. Persistent periventricular hypoattenuation compatible with transependymal CSF flow. 2. Edema and gas extending into the left internal capsule region which may be related to catheter placement. Electronically Signed   By: Sebastian Ache M.D.   On: 08/27/2018 10:08        Medical Problem List and Plan: 1.  Decreased functional mobility with unsteady gait and aphasia secondary to VP shunt malfunction/infection.  Status post shunt revision 08/26/2018             -admit to inpatient rehab 2.  DVT Prophylaxis/Anticoagulation: Subcutaneous heparin initiated 08/29/2018.  Monitor for any signs of DVT 3. Pain Management: Hydrocodone as needed 4. Mood: Provide emotional support 5. Neuropsych: This patient is capable of making decisions on his own behalf. 6. Skin/Wound Care: Routine skin checks 7. Fluids/Electrolytes/Nutrition: Routine in and outs with follow-up chemistries 8.  Constipation.  Laxative assistance    Post Admission Physician Evaluation: 1. Functional deficits secondary  to VP shunt dysfunction/hydrocephalus. 2. Patient is admitted to receive collaborative, interdisciplinary care between the physiatrist, rehab nursing staff, and therapy team. 3. Patient's level of medical complexity and substantial therapy needs in context of that medical necessity cannot be provided at a lesser intensity of care such as a SNF. 4. Patient has experienced substantial functional loss from his/her baseline which was documented above under the "Functional History" and "Functional Status" headings.  Judging by the patient's diagnosis, physical exam, and functional  history, the patient has potential for functional progress which will result in measurable gains while on inpatient rehab.  These gains will be of substantial and practical use upon discharge  in facilitating mobility and self-care at the household level. 5. Physiatrist will provide 24 hour management of medical needs as well as oversight of the therapy plan/treatment and provide guidance as appropriate regarding the interaction of the two. 6. The Preadmission Screening has been reviewed and patient status is unchanged unless otherwise stated above. 7. 24 hour rehab nursing will assist with bladder management, bowel management, safety, skin/wound care, disease management, medication administration, pain management and patient education  and help integrate therapy concepts, techniques,education, etc. 8. PT will assess and treat for/with: Lower extremity strength, range of motion, stamina, balance, functional mobility, safety, adaptive techniques and equipment .   Goals are: supervision. 9. OT will assess and treat for/with: ADL's, functional mobility, safety, upper extremity strength, adaptive techniques and equipment, NMR, family ed.   Goals are: supervision. Therapy may proceed with showering this patient. 10. SLP will assess and treat for/with: cognition, communication, family ed.  Goals are: supervision to min assist. 11. Case Management and Social Worker will assess and treat for psychological issues and discharge planning. 12. Team conference will be held weekly to assess progress toward goals and to determine barriers to discharge. 13. Patient will receive at least 3 hours of therapy per day at least 5 days per week. 14. ELOS: 8-12 days       15. Prognosis:  excellent   I have personally performed a face to face diagnostic evaluation of this patient and formulated the key  components of the plan.  Additionally, I have personally reviewed laboratory data, imaging studies, as well as relevant  notes and concur with the physician assistant's documentation above.  Ranelle Oyster, MD, [place] [facility]    Mcarthur Rossetti Angiulli, PA-C 08/29/2018  The patient's status has not changed. The original post admission physician evaluation remains appropriate, and any changes from the pre-admission screening or documentation from the acute chart are noted above.  Ranelle Oyster, MD 08/30/2018

## 2018-08-31 ENCOUNTER — Inpatient Hospital Stay (HOSPITAL_COMMUNITY): Payer: BLUE CROSS/BLUE SHIELD | Admitting: Physical Therapy

## 2018-08-31 ENCOUNTER — Inpatient Hospital Stay (HOSPITAL_COMMUNITY): Payer: BLUE CROSS/BLUE SHIELD | Admitting: Speech Pathology

## 2018-08-31 ENCOUNTER — Inpatient Hospital Stay (HOSPITAL_COMMUNITY): Payer: BLUE CROSS/BLUE SHIELD

## 2018-08-31 DIAGNOSIS — G918 Other hydrocephalus: Secondary | ICD-10-CM

## 2018-08-31 NOTE — Evaluation (Signed)
Speech Language Pathology Assessment and Plan  Patient Details  Name: Charles Marquez MRN: [id number] Date of Birth: 10/19/1956  SLP Diagnosis: Aphasia;Cognitive Impairments  Rehab Potential: Good ELOS: 10-15 days     Today's Date: 08/31/2018 SLP Individual Time: 2956-2130 SLP Individual Time Calculation (min): 60 min   Problem List:  Patient Active Problem List   Diagnosis Date Noted  . Hydrocephalus ([place]) 08/30/2018  . Dyslipidemia   . History of CVA (cerebrovascular accident)   . Benign essential HTN   . Tachycardia   . Leukocytosis   . Hyponatremia   . Hypokalemia   . 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 Infection of ventricular shunt ([place]) 08/09/2018  . Bacterial meningitis 08/09/2018  . TIA (transient ischemic attack) 01/29/2017  . Acute encephalopathy   . Shunt malfunction 03/13/2016  . 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 Hyperlipidemia 10/20/2014  . Degenerative disc disease, lumbar 04/15/2013  . Routine general medical examination at a health care facility 07/23/2012  . DISTURBANCE OF SKIN SENSATION 10/05/2008  . HYPERLIPIDEMIA 01/01/2008  . MYCOPLASMA PNEUMONIA 01/01/2008   Past Medical History:  Past Medical History:  Diagnosis Date  . Anxiety   . Hypercholesteremia   . Stroke ([place])    tia's  . TIA (transient ischemic attack)    09.15   Past Surgical History:  Past Surgical History:  Procedure Laterality Date  . Fractured arm Left 12  . HERNIA REPAIR Right 3/12  . LAPAROSCOPIC REVISION VENTRICULAR-PERITONEAL (V-P) SHUNT N/A 08/26/2018   Procedure: LAPAROSCOPIC INSERTION VENTRICULAR-PERITONEAL (V-P) SHUNT;  Surgeon: Earnie Larsson, MD;  Location: [place];  Service: Neurosurgery;  Laterality: N/A;  . LOOP RECORDER INSERTION N/A 04/10/2017   Procedure: Loop Recorder Insertion;  Surgeon: Thompson Grayer, MD;  Location: [place] CV LAB;  Service: Cardiovascular;  Laterality: N/A;  . SHUNT  REMOVAL Right 03/13/2016   Procedure: SHUNT REMOVAL;  Surgeon: Earnie Larsson, MD;  Location: [facility] NE[facility]O [facility]S;  Service: Neurosurgery;  Laterality: Right;  . SHUNT REMOVAL Right 08/09/2018   Procedure: SHUNT REMOVAL With Placement of Ventricular Catheter;  Surgeon: Consuella Lose, MD;  Location: [place];  Service: Neurosurgery;  Laterality: Right;  . SHUNT REVISION Right 08/05/2018   Procedure: SHUNT REVISION;  Surgeon: Earnie Larsson, MD;  Location: [place];  Service: Neurosurgery;  Laterality: Right;  . SHUNT REVISION VENTRICULAR-PERITONEAL Left 08/26/2018   Procedure: SHUNT REVISION VENTRICULAR-PERITONEAL;  Surgeon: Earnie Larsson, MD;  Location: [place];  Service: Neurosurgery;  Laterality: Left;  [name] VASECTOMY  10/02/1997  . VENTRICULOPERITONEAL SHUNT Right 12/18/2014   Procedure: Shunt Placment - right occipital VP shunt ;  Surgeon: Charlie Pitter, MD;  Location: [facility] NEURO ORS;  Service: Neurosurgery;  Laterality: Right;  Shunt Placment - right occipital VP shunt   . VENTRICULOPERITONEAL SHUNT Right 07/22/2018   Procedure: Shunt Placment right occipital;  Surgeon: Earnie Larsson, MD;  Location: [place];  Service: Neurosurgery;  Laterality: Right;  . VENTRICULOPERITONEAL SHUNT Left 08/26/2018   Procedure: LEFT SIDED VENTRICULAR-PERITONEAL SHUNT;  Surgeon: Earnie Larsson, MD;  Location: [place];  Service: Neurosurgery;  Laterality: Left;  [name] VENTRICULOSTOMY Right 08/09/2018   Procedure: VENTRICULOSTOMY;  Surgeon: Consuella Lose, MD;  Location: [place];  Service: Neurosurgery;  Laterality: Right;    Assessment / Plan / Recommendation Clinical Impression   [name] a 62 year old right-handed male with history of hyperlipidemia, 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 Per chart review and wife, patient  lives with spouse. Works in Engineer, technical sales for [employer]. Patient ambulating without assistive device after recent discharge. Two-level home 5 steps to entry. Patient with  recent admission 08/04/2018 for unsteadiness of gait and nausea vomiting findings of malfunction of VP shunt with revision completed 08/05/2018 and discharged home. He was readmitted 08/08/2018 to 08/22/2018 for VP shunt infection organism isolated Klebsiella. He again underwent removal of VP shunt with placement of external ventriculostomy and completed a 2-week course of IV antibiotics. Follow-up scan reviewed showing right hydrocephalus. Per report, mildly enlarged right lateral ventricle but left ventricle and third ventricle and fourth ventricle were small. Noted generalized decline with unstable gait noted bouts of aphasia. He was again readmitted 08/26/2018 for shunt revision ventricular peritoneal 08/26/2018 per Dr. Annette Stable. Subcutaneous heparin was added for DVT prophylaxis on 08/29/2018. Tolerating a regular diet. Therapy evaluations completed with recommendations of physical medicine rehab consult. Patient was admitted for a comprehensive rehab program.  SLP evaluation was completed on 08/31/2018 with the following results:  Pt presents with a moderately severe global aphasia.  Pt has difficulty following 1-step commands and answering basic yes/no questions.  [name] verbal expression is characterized by phonemic paraphasic errors, jargon, and perseveration.  Pt has no awareness of his deficits and as a result needs max assist for functional communication at this time.   Given the abovementioned deficits, pt would benefit from skilled ST while inpatient in order to maximize functional independence and reduce burden of care prior to discharge.  Anticipate that pt will need 24/7 supervision at discharge in addition to [place] follow up at next level of care.    Skilled Therapeutic Interventions          Cognitive-linguistic evaluation completed with results and recommendations reviewed with family.   SLP provided skilled education regarding strategies to maximize pt's awareness of errors in the hopes of  improving functional receptive and expressive language.  All questions were answered to their satisfaction at this time.      SLP Assessment  Patient will need skilled [facility] during CIR admission    Recommendations  Patient destination: Home Follow up Recommendations: Home Health SLP;Outpatient SLP;24 hour supervision/assistance Equipment Recommended: None recommended by SLP    SLP Frequency 3 to 5 out of 7 days   SLP Duration  SLP Intensity  SLP Treatment/Interventions 10-15 days   Minumum of 1-2 x/day, 30 to 90 minutes  Cognitive remediation/compensation;Cueing hierarchy;Environmental controls;Internal/external aids;Multimodal communication approach;Patient/family education;Functional tasks    Pain Pain Assessment Pain Scale: 0-10 Pain Score: 0-No pain  Prior Functioning Cognitive/Linguistic Baseline: Within functional limits Type of Home: House  Lives With: Spouse Available Help at Discharge: Family;Available 24 hours/day Vocation: Full time employment  Short Term Goals: Week 1: SLP Short Term Goal 1 (Week 1): Pt will follow 1 step commands in a functional context in >50% of opportunities with mod assist multimodal cues.   SLP Short Term Goal 2 (Week 1): Pt will answer basic yes/no questions to convey his needs and wants to caregivers for >50% accuracy with mod assist mulitmodal cues.   SLP Short Term Goal 3 (Week 1): Pt will name basic familiar objects for 50% accuracy with mod assist multimodal cues.    Refer to Care Plan for Long Term Goals  Recommendations for other services: None   Discharge Criteria: Patient will be discharged from SLP if patient refuses treatment 3 consecutive times without medical reason, if treatment goals not met, if there is a change in medical status, if  patient makes no progress towards goals or if patient is discharged from hospital.  The above assessment, treatment plan, treatment alternatives and goals were  discussed and mutually agreed upon: by patient and by family  Emilio Math 08/31/2018, 12:47 PM

## 2018-08-31 NOTE — Evaluation (Signed)
Occupational Therapy Assessment and Plan  Patient Details  Name: Charles Marquez MRN: [id number] Date of Birth: [birth year]  OT Diagnosis: cognitive deficits, disturbance of vision, hemiplegia affecting dominant side, muscle weakness (generalized) and coordination disorder Rehab Potential:   ELOS: 10-14   Today's Date: 08/31/2018 OT Individual Time: 0800-0900 OT Individual Time Calculation (min): 60 min     Problem List:  Patient Active Problem List   Diagnosis Date Noted  . Hydrocephalus ([place]) 08/30/2018  . Dyslipidemia   . History of CVA (cerebrovascular accident)   . Benign essential HTN   . Tachycardia   . Leukocytosis   . Hyponatremia   . Hypokalemia   . 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 Bacterial meningitis 08/09/2018  . TIA (transient ischemic attack) 01/29/2017  . Acute encephalopathy   . Shunt malfunction 03/13/2016  . 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 Hyperlipidemia 10/20/2014  . Degenerative disc disease, lumbar 04/15/2013  . Routine general medical examination at a health care facility 07/23/2012  . DISTURBANCE OF SKIN SENSATION 10/05/2008  . HYPERLIPIDEMIA 01/01/2008  . MYCOPLASMA PNEUMONIA 01/01/2008    Past Medical History:  Past Medical History:  Diagnosis Date  . Anxiety   . Hypercholesteremia   . Stroke ([place])    tia's  . TIA (transient ischemic attack)    09.15   Past Surgical History:  Past Surgical History:  Procedure Laterality Date  . Fractured arm Left 12  . HERNIA REPAIR Right 3/12  . LAPAROSCOPIC REVISION VENTRICULAR-PERITONEAL (V-P) SHUNT N/A 08/26/2018   Procedure: LAPAROSCOPIC INSERTION VENTRICULAR-PERITONEAL (V-P) SHUNT;  Surgeon: Earnie Larsson, MD;  Location: [place];  Service: Neurosurgery;  Laterality: N/A;  . LOOP RECORDER INSERTION N/A 04/10/2017   Procedure: Loop Recorder Insertion;  Surgeon: Thompson Grayer,  MD;  Location: [place] [facility];  Service: Cardiovascular;  Laterality: N/A;  . SHUNT REMOVAL Right 03/13/2016   Procedure: SHUNT REMOVAL;  Surgeon: Earnie Larsson, MD;  Location: [facility];  Service: Neurosurgery;  Laterality: Right;  . SHUNT REMOVAL Right 08/09/2018   Procedure: SHUNT REMOVAL With Placement of Ventricular Catheter;  Surgeon: Consuella Lose, MD;  Location: [place];  Service: Neurosurgery;  Laterality: Right;  . SHUNT REVISION Right 08/05/2018   Procedure: SHUNT REVISION;  Surgeon: Earnie Larsson, MD;  Location: [place];  Service: Neurosurgery;  Laterality: Right;  . SHUNT REVISION VENTRICULAR-PERITONEAL Left 08/26/2018   Procedure: SHUNT REVISION VENTRICULAR-PERITONEAL;  Surgeon: Earnie Larsson, MD;  Location: [place];  Service: Neurosurgery;  Laterality: Left;  [name] VASECTOMY  10/02/1997  . VENTRICULOPERITONEAL SHUNT Right 12/18/2014   Procedure: Shunt Placment - right occipital VP shunt ;  Surgeon: Charlie Pitter, MD;  Location: [place] [facility];  Service: Neurosurgery;  Laterality: Right;  Shunt Placment - right occipital VP shunt   . VENTRICULOPERITONEAL SHUNT Right 07/22/2018   Procedure: Shunt Placment right occipital;  Surgeon: Earnie Larsson, MD;  Location: [place];  Service: Neurosurgery;  Laterality: Right;  . VENTRICULOPERITONEAL SHUNT Left 08/26/2018   Procedure: LEFT SIDED VENTRICULAR-PERITONEAL SHUNT;  Surgeon: Earnie Larsson, MD;  Location: [place];  Service: Neurosurgery;  Laterality: Left;  [name] VENTRICULOSTOMY Right 08/09/2018   Procedure: VENTRICULOSTOMY;  Surgeon: Consuella Lose, MD;  Location: [place];  Service: Neurosurgery;  Laterality: Right;    Assessment & Plan Clinical Impression:62 year old male admitted 08/25/18 with gait disturbance and AMS. 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 Now referred for BSE and SLE.    Patient currently requires min with basic self-care skills  secondary to muscle weakness, decreased cardiorespiratoy endurance, decreased coordination and decreased motor planning, decreased visual perceptual skills and decreased visual motor skills, decreased initiation, decreased attention, decreased awareness, decreased problem solving, decreased safety awareness, decreased memory and delayed processing and decreased sitting balance, decreased standing balance, decreased postural control, hemiplegia and decreased balance strategies.  Prior to hospitalization, patient could complete BADL with modified independent .  Patient will benefit from skilled intervention to decrease level of assist with basic self-care skills and increase independence with basic self-care skills prior to discharge home with care partner.  Anticipate patient will require 24 hour supervision and follow up outpatient.  OT - End of Session Activity Tolerance: Tolerates 30+ min activity with multiple rests Endurance Deficit: Yes OT Assessment Rehab Potential (ACUTE ONLY): Good OT Patient demonstrates impairments in the following area(s): Balance;Cognition;Endurance;Motor;Safety;Vision OT Basic ADL's Functional Problem(s): Grooming;Bathing;Dressing;Toileting OT Transfers Functional Problem(s): Toilet;Tub/Shower OT Additional Impairment(s): None OT Plan OT Intensity: Minimum of 1-2 x/day, 45 to 90 minutes OT Frequency: 5 out of 7 days OT Duration/Estimated Length of Stay: 10-14 OT Treatment/Interventions: Balance/vestibular training;Discharge planning;Pain management;Self Care/advanced ADL retraining;Therapeutic Activities;UE/LE Coordination activities;Disease mangement/prevention;Cognitive remediation/compensation;Functional mobility training;Patient/family education;Skin care/wound managment;Therapeutic Exercise;Visual/perceptual remediation/compensation;Community reintegration;DME/adaptive equipment instruction;Neuromuscular re-education;Psychosocial support;Splinting/orthotics;UE/LE  Strength taining/ROM;Wheelchair propulsion/positioning OT Self Feeding Anticipated Outcome(s): S OT Basic Self-Care Anticipated Outcome(s): S OT Toileting Anticipated Outcome(s): S OT Bathroom Transfers Anticipated Outcome(s): S OT Recommendation Patient destination: Home Follow Up Recommendations: OPOT Equipment Recommended: 3 in 1 bedside comode;Tub/shower seat;To be determined   Skilled Therapeutic Intervention Pt received seated in recliner with wife present. Pt completes al transfers at ambualtory level with RW and min A overall for balance while managing RW in tight spaces with VC for safety awareness and longer stride length with RUE. Pt completes BADL as follows  Toilet transfer- min A with RW to elevated BSC over toilet Grooming- CGA standing at sink with  Pt undershooting puttin gtoothbrush underwater Pt requires VC for sequencing task Bathing overall CGA for standing balance and sitting balance superviison. Pt requires VC for sequencing bathing bosy parts and delayed processing of commands UB dressing LB dressing- CGA-min A sitting balance donning footwear, min A threading RLE into pant leg from OT and min A standing balance advancing pants past hips Footwear- CGA sitting balance with seated figure 4 todon socks and shoes with touching A to adjust socks over toes Tub transfer- CGA with use of grab bar and demonstration for step over tub ledge d/t aphasia impacting understanding of commands  Exited session with pt seated in reclienr with wife present to supervise and call light in reach  OT Evaluation Precautions/Restrictions  Precautions Precautions: Fall Precaution Comments: right lateral lean, R LE weakness, R inattention Other Brace/Splint: ventric drain, check with RN before seeing to see if it needs to be clamped (he has been unclamped for some of his sessions on purpose).  Restrictions Weight Bearing Restrictions: No General Chart Reviewed: Yes Vital Signs Therapy  Vitals Temp: 98.3 F (36.8 C) Temp Source: Oral Pulse Rate: 60 Resp: 14 BP: (!) 143/82 Patient Position (if appropriate): Lying Oxygen Therapy SpO2: 98 % O2 Device: Room Air Pain Pain Assessment Pain Score: 0-No pain Home Living/Prior Functioning Home Living Family/patient expects to be discharged to:: Private residence Living Arrangements: Spouse/significant other Available Help at Discharge: Family, Available 24 hours/day Type of Home: House Home Access: Stairs to enter   Entrance Stairs-Number of Steps: 5 Entrance Stairs-Rails: Can reach both Home Layout: Two level, 1/2 bath on main level, Bed/bath upstairs Alternate Level Stairs-Number of Steps: flight  Alternate Level Stairs-Rails: Left Bathroom Toilet: Standard Bathroom Accessibility: Yes Additional Comments: may need BSC  Lives With: Spouse Prior Function Level of Independence: Independent with basic ADLs, Independent with homemaking with ambulation, Independent with gait Comments: works in Engineer, technical sales for [employer]; umpires for baseball games. went home with wife at discharge 4 days PTA and was ambulating without an AD.  ADL   Vision Baseline Vision/History: Wears glasses Wears Glasses: At all times Patient Visual Report: Other (comment) Vision Assessment?: Vision impaired- to be further tested in functional context Perception    WFL Praxis   [facility] Cognition Overall Cognitive Status: Impaired/Different from baseline Arousal/Alertness: Awake/alert Year: 2019 Month: May Day of Week: Incorrect Immediate Memory Recall: Sock;Blue;Bed Awareness: Impaired Awareness Impairment: Intellectual impairment Safety/Judgment: Impaired Sensation Sensation Light Touch: Appears Intact Coordination Gross Motor Movements are Fluid and Coordinated: Yes Fine Motor Movements are Fluid and Coordinated: No Finger Nose Finger Test: undershoot with RUE; tremor PTA with LUE Motor  Motor Motor: Hemiplegia Motor - Skilled Clinical  Observations: mild R hemi Mobility    see above Trunk/Postural Assessment  Cervical Assessment Cervical Assessment: Within Functional Limits Thoracic Assessment Thoracic Assessment: Within Functional Limits Lumbar Assessment Lumbar Assessment: Within Functional Limits Postural Control Postural Control: Deficits on evaluation(insuffient)  Balance Balance Balance Assessed: Yes Dynamic Sitting Balance Sitting balance - Comments: min assist in sitting with posterior lean, slight R lean  Dynamic Standing Balance Dynamic Standing - Level of Assistance: 3: Mod assist;4: Min assist Dynamic Standing - Comments: min-MOD A during BADLs Extremity/Trunk Assessment RUE Assessment General Strength Comments: generalized weakness LUE Assessment LUE Assessment: Within Functional Limits     Refer to Care Plan for Long Term Goals  Recommendations for other services: Therapeutic Recreation  Pet therapy and Outing/community reintegration   Discharge Criteria: Patient will be discharged from OT if patient refuses treatment 3 consecutive times without medical reason, if treatment goals not met, if there is a change in medical status, if patient makes no progress towards goals or if patient is discharged from hospital.  The above assessment, treatment plan, treatment alternatives and goals were discussed and mutually agreed upon: by patient and by family  Tonny Branch 08/31/2018, 8:19 AM

## 2018-08-31 NOTE — Progress Notes (Addendum)
Subjective: Patient has no complaints.  Wife is in the room with him.  Objective:BP (!) 143/82 (BP Location: Left Arm)   Pulse 60   Temp 98.3 F (36.8 C) (Oral)   Resp 14   Ht 5\' 11"  (1.803 m)   Wt 79.3 kg   SpO2 98%   BMI 24.39 kg/m    well-developed well-nourished male in no acute distress. HEENT exam he has a healing wound no left occipital area., normocephalic, neck supple without jugular venous distention. Chest clear to auscultation cardiac exam S1-S2 are regular. Abdominal exam overweight with bowel sounds, soft and nontender. Extremities no edema. Neurologic exam is alert . He is confused with place and time  Assessment plan: 1.  Hydrocephalus.  Patient with an unsteady gait and aphasia secondary to a VP shunt malfunction and infection.  He had a shunt revision August 26, 2018.  Continue chronic inpatient rehab 2.  DVT prophylaxis 3.  Pain management: Currently no pain 4.  Constipation resolved.

## 2018-08-31 NOTE — IPOC Note (Signed)
Overall Plan of Care [facility]) Patient Details Name: Charles Marquez MRN: [id number] DOB: [birth year]  Admitting Diagnosis: Hydrocephalus  Hospital Problems: Active Problems:   Hydrocephalus [facility])     Functional Problem List: Nursing Behavior, Bladder, Bowel, Endurance, Medication Management, Pain, Safety, Skin Integrity  PT Balance, Behavior, Endurance, Motor, Safety, Perception  OT Balance, Cognition, Endurance, Motor, Safety, Vision  SLP Cognition, Linguistic  TR         Basic ADL's: OT Grooming, Bathing, Dressing, Toileting     Advanced  ADL's: OT       Transfers: PT Bed to Chair, Bed Mobility, Car, Floor, Occupational psychologist, Tub/Shower     Locomotion: PT Ambulation, Psychologist, prison and probation services, Stairs     Additional Impairments: OT None  SLP Communication, Social Cognition comprehension, expression Problem Solving, Attention, Awareness  TR      Anticipated Outcomes Item Anticipated Outcome  Self Feeding S  Swallowing      Basic self-care  S  Toileting  S   Bathroom Transfers S  Bowel/Bladder  LBM 08/30/18, CONT B/B   Transfers  supervision  Locomotion  supervision household gait  Communication  Min assist   Cognition     Pain  Denies pain 6/10 tolerable   Safety/Judgment  Refrain from falls/injuries. call light within reach, bed/chair alarm, proper footwear   Therapy Plan: PT Intensity: Minimum of 1-2 x/day ,45 to 90 minutes PT Frequency: 5 out of 7 days PT Duration Estimated Length of Stay: 10-14 days OT Intensity: Minimum of 1-2 x/day, 45 to 90 minutes OT Frequency: 5 out of 7 days OT Duration/Estimated Length of Stay: 10-14 SLP Intensity: Minumum of 1-2 x/day, 30 to 90 minutes SLP Frequency: 3 to 5 out of 7 days SLP Duration/Estimated Length of Stay: 10-15 days     Team Interventions: Nursing Interventions Patient/Family Education, Bladder Management, Bowel Management, Pain Management, Medication Management, Skin Care/Wound Management   PT interventions Ambulation/gait training, Disease management/prevention, Pain management, Stair training, Visual/perceptual remediation/compensation, Therapeutic Activities, Wheelchair propulsion/positioning, Patient/family education, DME/adaptive equipment instruction, Warden/ranger, Cognitive remediation/compensation, Functional electrical stimulation, Psychosocial support, Therapeutic Exercise, UE/LE Strength taining/ROM, Skin care/wound management, Functional mobility training, Community reintegration, Discharge planning, Neuromuscular re-education, Splinting/orthotics, UE/LE Coordination activities  OT Interventions Balance/vestibular training, Discharge planning, Pain management, Self Care/advanced ADL retraining, Therapeutic Activities, UE/LE Coordination activities, Disease mangement/prevention, Cognitive remediation/compensation, Functional mobility training, Patient/family education, Skin care/wound managment, Therapeutic Exercise, Visual/perceptual remediation/compensation, Firefighter, Fish farm manager, Neuromuscular re-education, Psychosocial support, Splinting/orthotics, UE/LE Strength taining/ROM, Wheelchair propulsion/positioning  SLP Interventions Cognitive remediation/compensation, Financial trader, Environmental controls, Internal/external aids, Multimodal communication approach, Patient/family education, Functional tasks  TR Interventions    SW/CM Interventions Discharge Planning, Psychosocial Support, Patient/Family Education   Barriers to Discharge MD  Medical stability and VP shunt  Nursing      PT Home environment access/layout bedroom on 2nd floor, wife states he is able to stay on first floor if need be  OT      SLP      SW       Team Discharge Planning: Destination: PT-Home ,OT- Home , SLP-Home Projected Follow-up: PT-Outpatient PT, Home health PT(HHPT vs OPPT), OT-  Home health OT, SLP-Home Health SLP, Outpatient SLP, 24  hour supervision/assistance Projected Equipment Needs: PT-To be determined, OT- 3 in 1 bedside comode, Tub/shower seat, To be determined, SLP-None recommended by SLP Equipment Details: PT- , OT-  Patient/family involved in discharge planning: PT- Family member/caregiver,  OT-Patient, Family member/caregiver, SLP-Patient, Family member/caregiver  MD ELOS: 14-17 days. Medical  Rehab Prognosis:  Good Assessment: 62 year old right-handed male with history of hyperlipidemia, 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 Per chart review and wife, patient lives with spouse. Works in Consulting civil engineer for [employer]. Patient ambulating without assistive device after recent discharge. Two-level home 5 steps to entry. Patient with recent admission 08/04/2018 for unsteadiness of gait and nausea vomiting findings of malfunction of VP shunt with revision completed 08/05/2018 and discharged home. He was readmitted 08/08/2018 to 08/22/2018 for VP shunt infection organism isolated Klebsiella. He again underwent removal of VP shunt with placement of external ventriculostomy and completed a 2-week course of IV antibiotics. Follow-up scan reviewed showing right hydrocephalus. Per report, mildly enlarged right lateral ventricle but left ventricle and third ventricle and fourth ventricle were small. Noted generalized decline with unstable gait noted bouts of aphasia. He was again readmitted 08/26/2018 for shunt revision ventricular peritoneal 08/26/2018 per Dr. Jordan Likes. Tolerating a regular diet. Patient with resulting functional deficits with gait, balance, self-care, cognition.  Will set goals for Supervision with PT/OT/SLP.  See Team Conference Notes for weekly updates to the plan of care

## 2018-08-31 NOTE — Evaluation (Addendum)
Physical Therapy Assessment and Plan  Patient Details  Name: Charles Marquez MRN: [id number] Date of Birth: July 04, 1956  PT Diagnosis: Abnormality of gait, Cognitive deficits, Coordination disorder, Difficulty walking, Hemiplegia dominant, Impaired cognition and Muscle weakness Rehab Potential: Excellent ELOS: 10-14 days   Today's Date: 08/31/2018 PT Individual Time: 1300-1405 PT Individual Time Calculation (min): 65 min    Problem List:  Patient Active Problem List   Diagnosis Date Noted  . Hydrocephalus ([place]) 08/30/2018  . Dyslipidemia   . History of CVA (cerebrovascular accident)   . Benign essential HTN   . Tachycardia   . Leukocytosis   . Hyponatremia   . Hypokalemia   . 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 Bacterial meningitis 08/09/2018  . TIA (transient ischemic attack) 01/29/2017  . Acute encephalopathy   . Shunt malfunction 03/13/2016  . 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 Hyperlipidemia 10/20/2014  . Degenerative disc disease, lumbar 04/15/2013  . Routine general medical examination at a health care facility 07/23/2012  . DISTURBANCE OF SKIN SENSATION 10/05/2008  . HYPERLIPIDEMIA 01/01/2008  . MYCOPLASMA PNEUMONIA 01/01/2008    Past Medical History:  Past Medical History:  Diagnosis Date  . Anxiety   . Hypercholesteremia   . Stroke ([place])    tia's  . TIA (transient ischemic attack)    09.15   Past Surgical History:  Past Surgical History:  Procedure Laterality Date  . Fractured arm Left 12  . HERNIA REPAIR Right 3/12  . LAPAROSCOPIC REVISION VENTRICULAR-PERITONEAL (V-P) SHUNT N/A 08/26/2018   Procedure: LAPAROSCOPIC INSERTION VENTRICULAR-PERITONEAL (V-P) SHUNT;  Surgeon: Earnie Larsson, MD;  Location: [facility];  Service: Neurosurgery;  Laterality: N/A;  . LOOP RECORDER INSERTION N/A 04/10/2017   Procedure: Loop Recorder Insertion;  Surgeon:  Thompson Grayer, MD;  Location: [facility];  Service: Cardiovascular;  Laterality: N/A;  . SHUNT REMOVAL Right 03/13/2016   Procedure: SHUNT REMOVAL;  Surgeon: Earnie Larsson, MD;  Location: [facility];  Service: Neurosurgery;  Laterality: Right;  . SHUNT REMOVAL Right 08/09/2018   Procedure: SHUNT REMOVAL With Placement of Ventricular Catheter;  Surgeon: Consuella Lose, MD;  Location: [place] [facility];  Service: Neurosurgery;  Laterality: Right;  . SHUNT REVISION Right 08/05/2018   Procedure: SHUNT REVISION;  Surgeon: Earnie Larsson, MD;  Location: [place];  Service: Neurosurgery;  Laterality: Right;  . SHUNT REVISION VENTRICULAR-PERITONEAL Left 08/26/2018   Procedure: SHUNT REVISION VENTRICULAR-PERITONEAL;  Surgeon: Earnie Larsson, MD;  Location: [facility];  Service: Neurosurgery;  Laterality: Left;  [name] VASECTOMY  10/02/1997  . VENTRICULOPERITONEAL SHUNT Right 12/18/2014   Procedure: Shunt Placment - right occipital VP shunt ;  Surgeon: Charlie Pitter, MD;  Location: [facility];  Service: Neurosurgery;  Laterality: Right;  Shunt Placment - right occipital VP shunt   . VENTRICULOPERITONEAL SHUNT Right 07/22/2018   Procedure: Shunt Placment right occipital;  Surgeon: Earnie Larsson, MD;  Location: [place] [facility];  Service: Neurosurgery;  Laterality: Right;  . VENTRICULOPERITONEAL SHUNT Left 08/26/2018   Procedure: LEFT SIDED VENTRICULAR-PERITONEAL SHUNT;  Surgeon: Earnie Larsson, MD;  Location: [place];  Service: Neurosurgery;  Laterality: Left;  [name] VENTRICULOSTOMY Right 08/09/2018   Procedure: VENTRICULOSTOMY;  Surgeon: Consuella Lose, MD;  Location: [facility];  Service: Neurosurgery;  Laterality: Right;    Assessment & Plan Clinical Impression: Patient is a 62 year old right-handed male with history of hyperlipidemia, CVA with loop recorder insertion 04/10/2017 as well as right occipital VP shunt placement  2016. Per chart review and wife, patient lives with spouse. Works in Engineer, technical sales for [employer]. Patient ambulating without assistive  device after recent discharge. Two-level home 5 steps to entry. Patient with recent admission 08/04/2018 for unsteadiness of gait and nausea vomiting findings of malfunction of VP shunt with revision completed 08/05/2018 and discharged home. He was readmitted 08/08/2018 to 08/22/2018 for VP shunt infection organism isolated Klebsiella. He again underwent removal of VP shunt with placement of external ventriculostomy and completed a 2-week course of IV antibiotics. Follow-up scan reviewed showing right hydrocephalus. Per report, mildly enlarged right lateral ventricle but left ventricle and third ventricle and fourth ventricle were small. Noted generalized decline with unstable gait noted bouts of aphasia. He was again readmitted 08/26/2018 for shunt revision ventricular peritoneal 08/26/2018 per Dr. Annette Stable. Subcutaneous heparin was added for DVT prophylaxis on 08/29/2018. Tolerating a regular diet. Therapy evaluations completed with recommendations of physical medicine rehab consult.  Patient transferred to [facility] on 08/30/2018 .   Patient currently requires min with mobility secondary to muscle weakness, decreased cardiorespiratoy endurance, unbalanced muscle activation, motor apraxia, decreased coordination and decreased motor planning, decreased visual perceptual skills and decreased visual motor skills, decreased attention to right, decreased initiation, decreased attention, decreased problem solving, decreased safety awareness, decreased memory and delayed processing and decreased standing balance, decreased postural control, hemiplegia and decreased balance strategies.  Prior to hospitalization, patient was independent  with mobility and lived with Spouse in a House home.  Home access is 5Stairs to enter.  Patient will benefit from skilled PT intervention to maximize safe functional mobility, minimize fall risk and decrease caregiver burden for planned discharge home with 24 hour supervision.   Anticipate patient will benefit from follow up OP at discharge.  PT - End of Session Activity Tolerance: Tolerates 30+ min activity with multiple rests Endurance Deficit: Yes Endurance Deficit Description: decreased, falling asleep at end fo session PT Assessment Rehab Potential (ACUTE/IP ONLY): Excellent PT Barriers to Discharge: Home environment access/layout PT Barriers to Discharge Comments: bedroom on 2nd floor, wife states he is able to stay on first floor if need be PT Patient demonstrates impairments in the following area(s): Balance;Behavior;Endurance;Motor;Safety;Perception PT Transfers Functional Problem(s): Bed to Chair;Bed Mobility;Car;Floor;Furniture PT Locomotion Functional Problem(s): Ambulation;Wheelchair Mobility;Stairs PT Plan PT Intensity: Minimum of 1-2 x/day ,45 to 90 minutes PT Frequency: 5 out of 7 days PT Duration Estimated Length of Stay: 10-14 days PT Treatment/Interventions: Ambulation/gait training;Disease management/prevention;Pain management;Stair training;Visual/perceptual remediation/compensation;Therapeutic Activities;Wheelchair propulsion/positioning;Patient/family education;DME/adaptive equipment instruction;Balance/vestibular training;Cognitive remediation/compensation;Functional electrical stimulation;Psychosocial support;Therapeutic Exercise;UE/LE Strength taining/ROM;Skin care/wound management;Functional mobility training;Community reintegration;Discharge planning;Neuromuscular re-education;Splinting/orthotics;UE/LE Coordination activities PT Transfers Anticipated Outcome(s): supervision PT Locomotion Anticipated Outcome(s): supervision household gait PT Recommendation Recommendations for Other Services: Therapeutic Recreation consult Follow Up Recommendations: Outpatient PT;Home health PT(HHPT vs OPPT) Patient destination: Home Equipment Recommended: To be determined  Skilled Therapeutic Intervention  Pt in recliner and agreeable to therapy, denies  pain. Wife and son present for session. Pt ambulated around unit w/ and w/o RW, min assist overall for balance and verbal cues for gait pattern and R obstacle negotiation. Needed mod-max verbal, tactile, and manual cues for sequencing, technique, and problem solving of functional tasks. Suspect 2/2 both command following and motor planning impairments. Performed dynavision from seated level to work on R attention. Average reaction speed 12 sec globally, 9 sec in L visual field and 14 sec in R visual field. Verbal and visual cues needed to initiate task, pt able to maintain sustained attention in quiet environment for 4-5 minutes. Instructed  pt and family in results of PT evaluation as detailed below, PT POC, rehab potential, rehab goals, and discharge recommendations. Additionally discussed CIR's policies regarding fall safety and use of chair alarm and/or quick release belt when family is not in the room. Pt and family verbalized understanding and in agreement. Ended session in recliner and in care of family, all needs met.   PT Evaluation Precautions/Restrictions Precautions Precautions: Fall Restrictions Weight Bearing Restrictions: No Pain Pain Assessment Pain Scale: 0-10 Pain Score: 0-No pain Home Living/Prior Functioning Home Living Available Help at Discharge: Family;Available 24 hours/day Type of Home: House Home Access: Stairs to enter [employer] of Steps: 5 Entrance Stairs-Rails: Can reach both Home Layout: Two level;1/2 bath on main level;Bed/bath upstairs Alternate Level Stairs-Number of Steps: flight  Alternate Level Stairs-Rails: Left Bathroom Shower/Tub: Tub/shower unit;Walk-in shower Bathroom Toilet: Standard Bathroom Accessibility: Yes  Lives With: Spouse Prior Function Level of Independence: Independent with basic ADLs;Independent with transfers;Independent with homemaking with wheelchair;Independent with gait  Able to Take Stairs?: Yes Driving:  Yes Vocation: Full time employment Comments: works in Engineer, technical sales for [employer]; umpires for baseball games, went home w/ wife before most recent revision, was ambulating w/o AD Vision/Perception  Vision - Assessment Ocular Range of Motion: Within Functional Limits Alignment/Gaze Preference: Within Defined Limits Tracking/Visual Pursuits: Decreased smoothness of horizontal tracking;Decreased smoothness of vertical tracking Saccades: Additional head turns occurred during testing;Additional eye shifts occurred during testing;Decreased speed of saccadic movement Convergence: Impaired (comment) Diplopia Assessment: Disappears with one eye closed(inconsistently reports that diplopia disappears w/ 1 eye closed, unsure of which eye is accurate) Additional Comments: Observed when attempting stairs Perception Perception: Impaired Inattention/Neglect: Does not attend to right side of body;Does not attend to right visual field Comments: Unsure if visual field deficits or inattention Praxis Praxis: Impaired Praxis Impairment Details: Motor planning  Cognition Overall Cognitive Status: Impaired/Different from baseline Arousal/Alertness: Awake/alert Orientation Level: Oriented to person Attention: Sustained Sustained Attention: Impaired Sustained Attention Impairment: Functional basic;Verbal basic Memory: Impaired Awareness: Impaired Awareness Impairment: Intellectual impairment Problem Solving: Impaired Problem Solving Impairment: Verbal basic;Functional basic Executive Function: (all impaired due to lower level deficits) Safety/Judgment: Impaired Comments: decreased awareness of deficits and decreased safety awareness Sensation Sensation Light Touch: Appears Intact(unable to formally assess 2/2 cognition, appears intact) Coordination Gross Motor Movements are Fluid and Coordinated: No Fine Motor Movements are Fluid and Coordinated: No Motor  Motor Motor: Hemiplegia Motor - Skilled Clinical  Observations: mild R hemi  Mobility Bed Mobility Bed Mobility: Rolling Right;Rolling Left;Sit to Supine;Supine to Sit Rolling Right: Supervision/verbal cueing Rolling Left: Supervision/Verbal cueing Supine to Sit: Supervision/Verbal cueing Sit to Supine: Supervision/Verbal cueing Transfers Transfers: Stand Pivot Transfers;Stand to Sit;Sit to Stand Sit to Stand: Contact Guard/Touching assist Stand to Sit: Contact Guard/Touching assist Stand Pivot Transfers: Minimal Assistance - Patient > 75% Stand Pivot Transfer Details: Verbal cues for safe use of DME/AE;Verbal cues for precautions/safety;Verbal cues for sequencing;Manual facilitation for weight bearing Transfer (Assistive device): 1 person hand held assist Locomotion  Gait Ambulation: Yes Gait Assistance: Minimal Assistance - Patient > 75% Gait Distance (Feet): 250 Feet Assistive device: None Gait Assistance Details: Verbal cues for technique;Verbal cues for sequencing;Verbal cues for precautions/safety;Tactile cues for posture;Tactile cues for weight shifting;Tactile cues for sequencing;Manual facilitation for weight shifting Gait Gait: Yes Gait Pattern: Impaired Gait Pattern: Poor foot clearance - right;Narrow base of support;Scissoring;Decreased trunk rotation;Shuffle Gait velocity: decreased Stairs / Additional Locomotion Stairs: Yes Stairs Assistance: Minimal Assistance - Patient > 75% Stair Management Technique: Two rails Number of  Stairs: 4 Height of Stairs: 6 Wheelchair Mobility Wheelchair Mobility: No  Trunk/Postural Assessment  Cervical Assessment Cervical Assessment: Within Functional Limits Thoracic Assessment Thoracic Assessment: Within Functional Limits Lumbar Assessment Lumbar Assessment: Within Functional Limits Postural Control Postural Control: Deficits on evaluation(delayed/absent)  Balance Balance Balance Assessed: Yes Static Sitting Balance Static Sitting - Balance Support: Feet supported;No upper  extremity supported Static Sitting - Level of Assistance: 5: Stand by assistance Dynamic Sitting Balance Dynamic Sitting - Balance Support: No upper extremity supported;Feet supported Dynamic Sitting - Level of Assistance: 4: Min assist Static Standing Balance Static Standing - Balance Support: No upper extremity supported;During functional activity Static Standing - Level of Assistance: 4: Min assist Dynamic Standing Balance Dynamic Standing - Balance Support: No upper extremity supported;During functional activity Dynamic Standing - Level of Assistance: 4: Min assist Extremity Assessment  RLE Assessment RLE Assessment: Within Functional Limits(difficult to formally assess 2/2 aphasia/apraxia, suspect WFL) LLE Assessment LLE Assessment: Exceptions to [facility] Passive Range of Motion (PROM) Comments: WFL General Strength Comments: Difficult to formally assess 2/2 cognition/apraxia, moving against gravity and able to maintain some resistance, 4/5 globally    Refer to Care Plan for Long Term Goals  Recommendations for other services: Therapeutic Recreation  Outing/community reintegration  Discharge Criteria: Patient will be discharged from PT if patient refuses treatment 3 consecutive times without medical reason, if treatment goals not met, if there is a change in medical status, if patient makes no progress towards goals or if patient is discharged from hospital.  The above assessment, treatment plan, treatment alternatives and goals were discussed and mutually agreed upon: by patient and by family  Clete Kuch K Davin Muramoto 08/31/2018, 2:21 PM

## 2018-09-01 ENCOUNTER — Inpatient Hospital Stay (HOSPITAL_COMMUNITY): Payer: BLUE CROSS/BLUE SHIELD

## 2018-09-01 LAB — BASIC METABOLIC PANEL
ANION GAP: 8 (ref 5–15)
ANION GAP: 6 (ref 5–15)
BUN: 10 mg/dL (ref 6–20)
BUN: 10 mg/dL (ref 6–20)
CALCIUM: 8.5 mg/dL — AB (ref 8.9–10.3)
CO2: 29 mmol/L (ref 22–32)
CO2: 31 mmol/L (ref 22–32)
CREATININE: 0.72 mg/dL (ref 0.61–1.24)
Calcium: 8.4 mg/dL — ABNORMAL LOW (ref 8.9–10.3)
Chloride: 96 mmol/L — ABNORMAL LOW (ref 98–111)
Chloride: 95 mmol/L — ABNORMAL LOW (ref 98–111)
Creatinine, Ser: 0.79 mg/dL (ref 0.61–1.24)
GFR calc non Af Amer: >60 mL/min (ref >60)
GFR calc non Af Amer: >60 mL/min (ref >60)
Glucose, Bld: 98 mg/dL (ref 70–99)
Glucose, Bld: 98 mg/dL (ref 70–99)
POTASSIUM: 3.8 mmol/L (ref 3.5–5.1)
POTASSIUM: 3.9 mmol/L (ref 3.5–5.1)
SODIUM: 133 mmol/L — AB (ref 135–145)
SODIUM: 132 mmol/L — AB (ref 135–145)

## 2018-09-01 LAB — CBC
HCT: 34.1 % — ABNORMAL LOW (ref 39.0–52.0)
Hemoglobin: 11.6 g/dL — ABNORMAL LOW (ref 13.0–17.0)
MCH: 31.5 pg (ref 26.0–34.0)
MCHC: 34 g/dL (ref 30.0–36.0)
MCV: 92.7 fL (ref 80.0–100.0)
NRBC: 0 % (ref 0.0–0.2)
Platelets: 148 10*3/uL — ABNORMAL LOW (ref 150–400)
RBC: 3.68 MIL/uL — AB (ref 4.22–5.81)
RDW: 12.7 % (ref 11.5–15.5)
WBC: 5.5 10*3/uL (ref 4.0–10.5)

## 2018-09-01 LAB — CULTURE, FUNGUS WITHOUT SMEAR: SPECIAL REQUESTS: NORMAL

## 2018-09-01 NOTE — Progress Notes (Signed)
Occupational Therapy Session Note  Patient Details  Name: Charles Marquez MRN: [id number] Date of Birth: 09-13-1956  Today's Date: 09/01/2018 OT Individual Time: 7253-6644 OT Individual Time Calculation (min): 48 min  and Today's Date: 09/01/2018 OT Missed Time: 13 Minutes Missed Time Reason: Other (comment);CT/MRI(Pt going to CT)   Short Term Goals: Week 1:  OT Short Term Goal 1 (Week 1): Pt will sequence oral care wiht no more than 1 VC OT Short Term Goal 2 (Week 1): Pt will sequence bathing body parts with no more than Min VC OT Short Term Goal 3 (Week 1): Pt will complete LB dressing with CGA OT Short Term Goal 4 (Week 1): Pt will don footwear wiht supervision for sitting balance  Skilled Therapeutic Interventions/Progress Updates:    1:1. Pt recieved seated in recliner with wife present. Pt with overall decline today as pt demo R inattention, perseverative motor patterns, decreased command following/comprehension and increased R lean throughout bathing and dressing. Expressed concerns with wife and RN as wife concerned about shunt malfuction. Pt completed bathing/dressing with touching A for standing balance and MOD A for sitting balance. Pt unable to manage RW in space and does not turn head to R during mobility to locate bathroom door. Once seated in shower pt requires HOH A to doff all clothing/demonstration from OT for doffing footwear as well as reqiring HOH A to wash all body parts as pt perseverates on washing hands/grab bar. Pt dresses sit to stand level with touching A for standing balance and MOD A for sitting balance requiring A to orient clothing as well as thread LLE into separate pants/underwear leg. Pt able to don B socks however requires A for B shoes. Exited session with pt seated in bed as RN requesting this positioning for when CT arrives. Pt missed 13 min skilled OT treatment d/t awaiting CT. Exited session with pt in bed and wife present supervising  Therapy  Documentation Precautions:  Precautions Precautions: Fall Precaution Comments: right lateral lean, R LE weakness, R inattention Other Brace/Splint: ventric drain, check with RN before seeing to see if it needs to be clamped (he has been unclamped for some of his sessions on purpose).  Restrictions Weight Bearing Restrictions: No General:   Therapy/Group: Individual Therapy  [name] 09/01/2018, 9:48 AM

## 2018-09-01 NOTE — Progress Notes (Signed)
Patient wife getting concern that patient is getting more confused. She claims that per Dr. Maurice Small after the xray will be resulted to confirm placement of shunt  they will do an adjustment. RN checked with Xray but it has not resulted because it was ordered as routine. RN explained to wife but she said she wanted to speak with on call MD. RN paged on  call and she was able to speak with the wife. The wife said that something has to be done tonight not tomorrow. RN called back on call Cala Bradford NP and she said she spoke with Dr. Glee Arvin and she will be coming to do the adjustment of the shunt in 1-2 hours. RN explained to wife the plan. Reported to incoming RN.

## 2018-09-01 NOTE — Progress Notes (Signed)
Notified at [phone] by RN Edson Snowball that patient was noted by OT to have increased lethargy and some inattention during session this AM - with some new balance issues.  RN stated patient needed urgent CT scan and was asking for help with transporting.  I was in critical situation where intubation was being considered.  I asked the RN to make arrangements with her unit to accompany the patient to scan.  I called to check on the status around 1027 and was told they were waiting on transport team to help.  Again at 1100 I checked on status - patient still waiting for scan - I then headed to to assist with CT scan.  Patient tol CT scan - no changes - lethargic - wife present - states he sleeps heavy like this at times.  Drs aware - waiting for scan results per report RN Edson Snowball.  VSS.  Will follow as needed.  Patient in NAD.

## 2018-09-01 NOTE — Progress Notes (Addendum)
Subjective: patient alert this morning. Lying in bed  Preparing for breakfaat Wife states that he seems to get confused later in the evening. Slept well. Wife is following temperature closely   Objective:BP (!) 121/96   Pulse 74   Temp 99.1 F (37.3 C) (Oral)   Resp 16   Ht 5\' 11"  (1.803 m)   Wt 79.3 kg   SpO2 97%   BMI 24.39 kg/m    well-developed well-nourished male in no acute distress. HEENT exam he has a healing wound no left occipital area., normocephalic, neck supple without jugular venous distention. Chest clear to auscultation cardiac exam S1-S2 are regular. Abdominal exam overweight with bowel sounds, soft and nontender. Extremities no edema. Neurologic exam is alert . He is confused with place and time  Assessment plan: 1.  Hydrocephalus.  Patient with an unsteady gait and aphasia secondary to a VP shunt malfunction and infection.  He had a shunt revision August 26, 2018.  Continue chronic inpatient rehab 2.  DVT prophylaxis 3.  Pain management: Currently no pain 4.  Constipation resolved.   Later 0930- nurse calls stating that therapist thinks there has been a change. Maybe requiring more cueing. Some inattention.  i've asked for CT head and for nurse to notify neurosurrgery. Concern for shunt malfunction  Will check labs: cbc, bmet

## 2018-09-01 NOTE — Progress Notes (Signed)
CT scan has resulted.message sent to Dr. Maurice Small. Family wants to speak to him. MD called back new orders noted.

## 2018-09-01 NOTE — Progress Notes (Addendum)
RN was called to room by OT noted that patient has  delayed response and needs a lot of cueing. Per OT he has right inattention, standing and sitting balance is more off today than yesterday. Dr. Cato Mulligan notified ordered for CT scan and neurosurgery consult. RN spoke with Dr. Maurice Small he said he will wait for the results of the CT and will come to check on the patient. Wife in room explained MD orders. Charge RN notified, Rapid response notified as well. Vital signs taken

## 2018-09-02 ENCOUNTER — Inpatient Hospital Stay (HOSPITAL_COMMUNITY): Payer: BLUE CROSS/BLUE SHIELD | Admitting: Anesthesiology

## 2018-09-02 ENCOUNTER — Inpatient Hospital Stay (HOSPITAL_COMMUNITY): Payer: BLUE CROSS/BLUE SHIELD | Admitting: Physical Therapy

## 2018-09-02 ENCOUNTER — Encounter (HOSPITAL_COMMUNITY): Admission: RE | Disposition: A | Discharge status: Rehabilitation Facility | Payer: Self-pay | Attending: Neurosurgery

## 2018-09-02 ENCOUNTER — Inpatient Hospital Stay (HOSPITAL_COMMUNITY)
Admission: RE | Admit: 2018-09-02 | Discharge: 2018-09-10 | DRG: 032 | Disposition: A | Discharge status: Rehabilitation Facility | Payer: BLUE CROSS/BLUE SHIELD | Attending: Neurosurgery | Admitting: Neurosurgery

## 2018-09-02 ENCOUNTER — Inpatient Hospital Stay (HOSPITAL_COMMUNITY): Payer: BLUE CROSS/BLUE SHIELD | Admitting: Occupational Therapy

## 2018-09-02 ENCOUNTER — Inpatient Hospital Stay (HOSPITAL_COMMUNITY): Payer: BLUE CROSS/BLUE SHIELD

## 2018-09-02 DIAGNOSIS — D62 Acute posthemorrhagic anemia: Secondary | ICD-10-CM | POA: Diagnosis not present

## 2018-09-02 DIAGNOSIS — T8501XA Breakdown (mechanical) of ventricular intracranial (communicating) shunt, initial encounter: Principal | ICD-10-CM | POA: Diagnosis present

## 2018-09-02 DIAGNOSIS — R5383 Other fatigue: Secondary | ICD-10-CM | POA: Diagnosis present

## 2018-09-02 DIAGNOSIS — G919 Hydrocephalus, unspecified: Secondary | ICD-10-CM | POA: Diagnosis not present

## 2018-09-02 DIAGNOSIS — M1990 Unspecified osteoarthritis, unspecified site: Secondary | ICD-10-CM | POA: Diagnosis present

## 2018-09-02 DIAGNOSIS — E871 Hypo-osmolality and hyponatremia: Secondary | ICD-10-CM | POA: Diagnosis not present

## 2018-09-02 DIAGNOSIS — G918 Other hydrocephalus: Secondary | ICD-10-CM | POA: Diagnosis not present

## 2018-09-02 DIAGNOSIS — R482 Apraxia: Secondary | ICD-10-CM | POA: Diagnosis present

## 2018-09-02 DIAGNOSIS — Y752 Prosthetic and other implants, materials and neurological devices associated with adverse incidents: Secondary | ICD-10-CM | POA: Diagnosis present

## 2018-09-02 DIAGNOSIS — Z982 Presence of cerebrospinal fluid drainage device: Secondary | ICD-10-CM

## 2018-09-02 DIAGNOSIS — R4701 Aphasia: Secondary | ICD-10-CM | POA: Diagnosis present

## 2018-09-02 DIAGNOSIS — G910 Communicating hydrocephalus: Secondary | ICD-10-CM | POA: Diagnosis present

## 2018-09-02 HISTORY — PX: SHUNT REVISION VENTRICULAR-PERITONEAL: SHX6094

## 2018-09-02 LAB — CBC WITH DIFFERENTIAL/PLATELET
ABS IMMATURE GRANULOCYTES: 0.01 10*3/uL (ref 0.00–0.07)
BASOS ABS: 0 10*3/uL (ref 0.0–0.1)
Basophils Relative: 1 %
EOS PCT: 3 %
Eosinophils Absolute: 0.2 10*3/uL (ref 0.0–0.5)
HEMATOCRIT: 36.2 % — AB (ref 39.0–52.0)
Hemoglobin: 12.1 g/dL — ABNORMAL LOW (ref 13.0–17.0)
Immature Granulocytes: 0 %
LYMPHS ABS: 1.2 10*3/uL (ref 0.7–4.0)
Lymphocytes Relative: 21 %
MCH: 30.5 pg (ref 26.0–34.0)
MCHC: 33.4 g/dL (ref 30.0–36.0)
MCV: 91.2 fL (ref 80.0–100.0)
MONO ABS: 0.7 10*3/uL (ref 0.1–1.0)
Monocytes Relative: 12 %
NEUTROS ABS: 3.4 10*3/uL (ref 1.7–7.7)
NRBC: 0 % (ref 0.0–0.2)
Neutrophils Relative %: 63 %
Platelets: 176 10*3/uL (ref 150–400)
RBC: 3.97 MIL/uL — AB (ref 4.22–5.81)
RDW: 12.6 % (ref 11.5–15.5)
WBC: 5.5 10*3/uL (ref 4.0–10.5)

## 2018-09-02 LAB — PROTEIN AND GLUCOSE, CSF
GLUCOSE CSF: 47 mg/dL (ref 40–70)
TOTAL PROTEIN, CSF: 399 mg/dL — AB (ref 15–45)

## 2018-09-02 SURGERY — REVISION, SHUNT, VENTRICULOPERITONEAL
Anesthesia: General | Site: Head | Laterality: Left

## 2018-09-02 MED ORDER — MIDAZOLAM HCL 2 MG/2ML IJ SOLN
INTRAMUSCULAR | Status: AC
  Filled 2018-09-02: qty 2

## 2018-09-02 MED ORDER — PHENYLEPHRINE HCL 10 MG/ML IJ SOLN
INTRAMUSCULAR | Status: AC
  Filled 2018-09-02: qty 1

## 2018-09-02 MED ORDER — ROCURONIUM 10MG/ML (10ML) SYRINGE FOR MEDFUSION PUMP - OPTIME
INTRAVENOUS | Status: DC | PRN
  Administered 2018-09-02: 10 mg via INTRAVENOUS
  Administered 2018-09-02: 40 mg via INTRAVENOUS

## 2018-09-02 MED ORDER — LIDOCAINE-EPINEPHRINE 1 %-1:100000 IJ SOLN
INTRAMUSCULAR | Status: AC
  Filled 2018-09-02: qty 1

## 2018-09-02 MED ORDER — SUGAMMADEX SODIUM 200 MG/2ML IV SOLN
INTRAVENOUS | Status: DC | PRN
  Administered 2018-09-02: 200 mg via INTRAVENOUS

## 2018-09-02 MED ORDER — BACITRACIN ZINC 500 UNIT/GM EX OINT
TOPICAL_OINTMENT | CUTANEOUS | Status: AC
  Filled 2018-09-02: qty 28.35

## 2018-09-02 MED ORDER — ONDANSETRON HCL 4 MG/2ML IJ SOLN
INTRAMUSCULAR | Status: DC | PRN
  Administered 2018-09-02: 4 mg via INTRAVENOUS

## 2018-09-02 MED ORDER — LIDOCAINE HCL (CARDIAC) PF 100 MG/5ML IV SOSY
PREFILLED_SYRINGE | INTRAVENOUS | Status: DC | PRN
  Administered 2018-09-02: 60 mg via INTRATRACHEAL

## 2018-09-02 MED ORDER — SODIUM CHLORIDE 0.9 % IV SOLN
INTRAVENOUS | Status: DC | PRN
  Administered 2018-09-02: 500 mL

## 2018-09-02 MED ORDER — THROMBIN 5000 UNITS EX SOLR
OROMUCOSAL | Status: DC | PRN
  Administered 2018-09-02: 5 mL via TOPICAL

## 2018-09-02 MED ORDER — VANCOMYCIN HCL IN DEXTROSE 1-5 GM/200ML-% IV SOLN
1000.0000 mg | INTRAVENOUS | Status: DC
  Filled 2018-09-02: qty 200

## 2018-09-02 MED ORDER — 0.9 % SODIUM CHLORIDE (POUR BTL) OPTIME
TOPICAL | Status: DC | PRN
  Administered 2018-09-02: 1000 mL

## 2018-09-02 MED ORDER — FENTANYL CITRATE (PF) 250 MCG/5ML IJ SOLN
INTRAMUSCULAR | Status: DC | PRN
  Administered 2018-09-02: 50 ug via INTRAVENOUS

## 2018-09-02 MED ORDER — SENNOSIDES-DOCUSATE SODIUM 8.6-50 MG PO TABS: 1.0000 | ORAL_TABLET | Freq: Every day | ORAL | Status: DC

## 2018-09-02 MED ORDER — SODIUM CHLORIDE 0.9 % IV SOLN
2.0000 g | Freq: Once | INTRAVENOUS | Status: DC
  Administered 2018-09-02: 2 g via INTRAVENOUS
  Filled 2018-09-02: qty 20

## 2018-09-02 MED ORDER — SODIUM CHLORIDE 0.9 % IJ SOLN
INTRAMUSCULAR | Status: AC
  Filled 2018-09-02: qty 10

## 2018-09-02 MED ORDER — BACITRACIN ZINC 500 UNIT/GM EX OINT
TOPICAL_OINTMENT | CUTANEOUS | Status: DC | PRN
  Administered 2018-09-02: 1 via TOPICAL

## 2018-09-02 MED ORDER — PROPOFOL 10 MG/ML IV BOLUS
INTRAVENOUS | Status: AC
  Filled 2018-09-02: qty 20

## 2018-09-02 MED ORDER — ONDANSETRON HCL 4 MG/2ML IJ SOLN: 4.0000 mg | Freq: Once | INTRAMUSCULAR | Status: DC | PRN

## 2018-09-02 MED ORDER — VANCOMYCIN HCL IN DEXTROSE 1-5 GM/200ML-% IV SOLN
INTRAVENOUS | Status: AC
  Filled 2018-09-02: qty 200

## 2018-09-02 MED ORDER — VANCOMYCIN HCL 1000 MG IV SOLR
INTRAVENOUS | Status: DC | PRN
  Administered 2018-09-02: 1000 mg via INTRAVENOUS

## 2018-09-02 MED ORDER — PHENYLEPHRINE HCL 10 MG/ML IJ SOLN
INTRAMUSCULAR | Status: DC | PRN
  Administered 2018-09-02: 80 ug via INTRAVENOUS

## 2018-09-02 MED ORDER — FENTANYL CITRATE (PF) 100 MCG/2ML IJ SOLN: 25.0000 ug | INTRAMUSCULAR | Status: DC | PRN

## 2018-09-02 MED ORDER — SODIUM CHLORIDE 0.9 % IV SOLN
INTRAVENOUS | Status: DC | PRN
  Administered 2018-09-02: 50 ug/min via INTRAVENOUS

## 2018-09-02 MED ORDER — LACTATED RINGERS IV SOLN
INTRAVENOUS | Status: DC | PRN
  Administered 2018-09-02: 21:00:00 via INTRAVENOUS

## 2018-09-02 MED ORDER — FENTANYL CITRATE (PF) 250 MCG/5ML IJ SOLN
INTRAMUSCULAR | Status: AC
  Filled 2018-09-02: qty 5

## 2018-09-02 MED ORDER — PROPOFOL 10 MG/ML IV BOLUS
INTRAVENOUS | Status: DC | PRN
  Administered 2018-09-02: 130 mg via INTRAVENOUS

## 2018-09-02 MED ORDER — THROMBIN 5000 UNITS EX SOLR
CUTANEOUS | Status: AC
  Filled 2018-09-02: qty 5000

## 2018-09-02 SURGICAL SUPPLY — 72 items
BAG DECANTER FOR FLEXI CONT (MISCELLANEOUS) ×3 IMPLANT
BANDAGE ADH SHEER 1  50/CT (GAUZE/BANDAGES/DRESSINGS) IMPLANT
BENZOIN TINCTURE PRP APPL 2/3 (GAUZE/BANDAGES/DRESSINGS) ×3 IMPLANT
BLADE SURG 11 STRL SS (BLADE) ×3 IMPLANT
BNDG GAUZE ELAST 4 BULKY (GAUZE/BANDAGES/DRESSINGS) IMPLANT
BUR ACORN 6.0 PRECISION (BURR) IMPLANT
BUR ACORN 6.0MM PRECISION (BURR)
CANISTER SUCT 3000ML PPV (MISCELLANEOUS) ×3 IMPLANT
CARTRIDGE OIL MAESTRO DRILL (MISCELLANEOUS) IMPLANT
CATH VENTRICULAR SHUNT 11 (Stent) IMPLANT
CLIP RANEY DISP (INSTRUMENTS) IMPLANT
CLOSURE WOUND 1/2 X4 (GAUZE/BANDAGES/DRESSINGS)
COVER WAND RF STERILE (DRAPES) IMPLANT
DERMABOND ADVANCED (GAUZE/BANDAGES/DRESSINGS)
DERMABOND ADVANCED .7 DNX12 (GAUZE/BANDAGES/DRESSINGS) IMPLANT
DIFFUSER DRILL AIR PNEUMATIC (MISCELLANEOUS) IMPLANT
DRAPE INCISE IOBAN 85X60 (DRAPES) ×3 IMPLANT
DRAPE ORTHO SPLIT 77X108 STRL (DRAPES) ×2
DRAPE SURG 17X23 STRL (DRAPES) IMPLANT
DRAPE SURG ORHT 6 SPLT 77X108 (DRAPES) ×1 IMPLANT
DRSG OPSITE 4X5.5 SM (GAUZE/BANDAGES/DRESSINGS) IMPLANT
DRSG OPSITE POSTOP 3X4 (GAUZE/BANDAGES/DRESSINGS) ×3 IMPLANT
ELECT REM PT RETURN 9FT ADLT (ELECTROSURGICAL) ×3
ELECTRODE REM PT RTRN 9FT ADLT (ELECTROSURGICAL) ×1 IMPLANT
GAUZE SPONGE 4X4 12PLY STRL (GAUZE/BANDAGES/DRESSINGS) ×3 IMPLANT
GLOVE BIO SURGEON STRL SZ7 (GLOVE) ×9 IMPLANT
GLOVE BIOGEL PI IND STRL 7.5 (GLOVE) ×1 IMPLANT
GLOVE BIOGEL PI INDICATOR 7.5 (GLOVE) ×2
GLOVE ECLIPSE 9.0 STRL (GLOVE) ×6 IMPLANT
GLOVE EXAM NITRILE LRG STRL (GLOVE) IMPLANT
GLOVE EXAM NITRILE XL STR (GLOVE) IMPLANT
GLOVE EXAM NITRILE XS STR PU (GLOVE) IMPLANT
GOWN STRL REUS W/ TWL LRG LVL3 (GOWN DISPOSABLE) IMPLANT
GOWN STRL REUS W/ TWL XL LVL3 (GOWN DISPOSABLE) IMPLANT
GOWN STRL REUS W/TWL 2XL LVL3 (GOWN DISPOSABLE) IMPLANT
GOWN STRL REUS W/TWL LRG LVL3 (GOWN DISPOSABLE)
GOWN STRL REUS W/TWL XL LVL3 (GOWN DISPOSABLE)
HEMOSTAT SURGICEL 2X14 (HEMOSTASIS) IMPLANT
KIT BASIN OR (CUSTOM PROCEDURE TRAY) ×3 IMPLANT
KIT TURNOVER KIT B (KITS) ×3 IMPLANT
MARKER SKIN DUAL TIP RULER LAB (MISCELLANEOUS) ×3 IMPLANT
NS IRRIG 1000ML POUR BTL (IV SOLUTION) ×3 IMPLANT
OIL CARTRIDGE MAESTRO DRILL (MISCELLANEOUS)
PACK LAMINECTOMY NEURO (CUSTOM PROCEDURE TRAY) ×3 IMPLANT
PAD ARMBOARD 7.5X6 YLW CONV (MISCELLANEOUS) ×3 IMPLANT
PATTIES SURGICAL .5 X3 (DISPOSABLE) IMPLANT
RUBBERBAND STERILE (MISCELLANEOUS) IMPLANT
SHEATH PERITONEAL INTRO 46 (MISCELLANEOUS) IMPLANT
SHEATH PERITONEAL INTRO 61 (MISCELLANEOUS) IMPLANT
SHUNT STRATA 11 SNAP REG (Shunt) ×3 IMPLANT
SPONGE INTESTINAL PEANUT (DISPOSABLE) IMPLANT
SPONGE LAP 4X18 RFD (DISPOSABLE) ×3 IMPLANT
SPONGE SURGIFOAM ABS GEL SZ50 (HEMOSTASIS) IMPLANT
STAPLER VISISTAT 35W (STAPLE) ×3 IMPLANT
STRIP CLOSURE SKIN 1/2X4 (GAUZE/BANDAGES/DRESSINGS) IMPLANT
SUT CHROMIC 3 0 SH 27 (SUTURE) IMPLANT
SUT ETHILON 3 0 FSL (SUTURE) ×3 IMPLANT
SUT ETHILON 4 0 PS 2 18 (SUTURE) IMPLANT
SUT NURALON 4 0 TR CR/8 (SUTURE) IMPLANT
SUT SILK 0 TIES 10X30 (SUTURE) IMPLANT
SUT SILK 2 0 TIES 17X18 (SUTURE) ×2
SUT SILK 2-0 18XBRD TIE BLK (SUTURE) ×1 IMPLANT
SUT SILK 3 0 SH 30 (SUTURE) IMPLANT
SUT VIC AB 2-0 CT2 18 VCP726D (SUTURE) ×3 IMPLANT
SUT VIC AB 3-0 SH 8-18 (SUTURE) ×3 IMPLANT
SUT VICRYL 4-0 PS2 18IN ABS (SUTURE) IMPLANT
SYR 5ML LL (SYRINGE) IMPLANT
TOWEL GREEN STERILE (TOWEL DISPOSABLE) IMPLANT
TOWEL GREEN STERILE FF (TOWEL DISPOSABLE) ×3 IMPLANT
TRAY FOLEY MTR SLVR 16FR STAT (SET/KITS/TRAYS/PACK) IMPLANT
UNDERPAD 30X30 (UNDERPADS AND DIAPERS) ×3 IMPLANT
WATER STERILE IRR 1000ML POUR (IV SOLUTION) ×3 IMPLANT

## 2018-09-02 NOTE — Care Management Note (Signed)
[facility] Individual Statement of Services  Patient Name:  Charles Marquez  Date:  09/02/2018  Welcome to the [facility].  Our goal is to provide you with an individualized program based on your diagnosis and situation, designed to meet your specific needs.  With this comprehensive rehabilitation program, you will be expected to participate in at least 3 hours of rehabilitation therapies Monday-Friday, with modified therapy programming on the weekends.  Your rehabilitation program will include the following services:  Physical Therapy (PT), Occupational Therapy (OT), Speech Therapy (ST), 24 hour per day rehabilitation nursing, Therapeutic Recreaction (TR), Neuropsychology, Case Management (Social Worker), Rehabilitation Medicine, Nutrition Services and Pharmacy Services  Weekly team conferences will be held on Wednesday to discuss your progress.  Your Social Worker will talk with you frequently to get your input and to update you on team discussions.  Team conferences with you and your family in attendance may also be held.  Expected length of stay: 10-15 days  Overall anticipated outcome: supervision with cues  Depending on your progress and recovery, your program may change. Your Social Worker will coordinate services and will keep you informed of any changes. Your Social Worker's name and contact numbers are listed  below.  The following services may also be recommended but are not provided by the [facility]:   Driving Evaluations  Home Health Rehabiltiation Services  Outpatient Rehabilitation Services  Vocational Rehabilitation   Arrangements will be made to provide these services after discharge if needed.  Arrangements include referral to agencies that provide these services.  Your insurance has been verified to be:  [employer] of [place] Your primary doctor is:  Antony Haste  Pertinent information will be shared with your  doctor and your insurance company.  Social Worker:  Dossie Der, SW [phone] or [phone]  Information discussed with and copy given to patient by: Lucy Chris, 09/02/2018, 1:20 PM

## 2018-09-02 NOTE — Progress Notes (Signed)
The patient has continued to decline through the course of the day.  The patient now is very somnolent.  He will awaken to vigorous stimulation.  He is minimally verbal.  He will follow simple commands.  I believe the patient's clinical situation is that of a VP shunt malfunction with increasing intracranial pressure secondary to lack of adequate CSF diversion.  I discussed situation with the family.  I recommended returning to the operating room tonight where we will reexplore his VP shunt and revise it as necessary.  I discussed the risks and benefits involved with surgery including but not limited to the risk of anesthesia, bleeding, infection, shunt malfunction, need for further surgery, and non-benefit.  The patient's family agrees to proceed.

## 2018-09-02 NOTE — Anesthesia Procedure Notes (Signed)
Procedure Name: Intubation Date/Time: 09/02/2018 9:38 PM Performed by: Molli Hazard, CRNA Pre-anesthesia Checklist: Patient identified, Emergency Drugs available, Suction available and Patient being monitored Patient Re-evaluated:Patient Re-evaluated prior to induction Oxygen Delivery Method: Circle system utilized Preoxygenation: Pre-oxygenation with 100% oxygen Induction Type: IV induction Ventilation: Mask ventilation without difficulty Laryngoscope Size: Miller and 2 Grade View: Grade II Tube type: Oral Tube size: 7.5 mm Number of attempts: 1 Airway Equipment and Method: Stylet Placement Confirmation: ETT inserted through vocal cords under direct vision,  positive ETCO2 and breath sounds checked- equal and bilateral Secured at: 23 cm Tube secured with: Tape Dental Injury: Teeth and Oropharynx as per pre-operative assessment

## 2018-09-02 NOTE — Op Note (Signed)
Date of procedure: 09/02/2018  Date of dictation: Same  Service: Neurosurgery  Preoperative diagnosis: Left occipital VP shunt malfunction  Postoperative diagnosis: Same  Procedure Name: Left proximal VP shunt revision  Surgeon:Claribel Sachs A.Morio Widen, M.D.  Asst. Surgeon: None  Anesthesia: General  Indication: 62 year old male with complicated shunt history presents with worsening level of consciousness and enlarging right lateral ventricle.  Patient presents now for shunt revision  Operative note: After induction anesthesia, patient position with his head turned toward the right.  His left occipital scalp neck chest and abdomen were prepped and draped sterilely.  Left occipital wound was reopened.  The snap connector between the valve and the ventricular catheter was disassembled.  There was a minimal slow drippage of CSF from the catheter itself.  I withdrew the catheter.  It was not bound or attached in any way.  I replaced a new 11 cm catheter along the same tract with good return of CSF under pressure.  I examined the old ventricular catheter.  This showed evidence of fibrous ingrowth in the distal catheter tips.  I trimmed the catheter and attached it to the proximal aspect of the valve.  I hooked a blunt needle and a syringe to the catheter.  The distal shunt flushed easily with no evidence of restriction or other occlusion.  I then ascertained that the shunt valve itself was not occluded or obstructed in any way and I did not see any reason to remove this and replace it with another.  I collected CSF from the ventricle for cell count and cultures.  I connected the ventricular catheter to the proximal aspect of the valve system.  The shunt valve pumped and refilled easily.  The wounds were irrigated and then closed in a typical fashion.  Sterile dressing was applied.  There were no apparent complications.  Patient tolerated the procedure well.  He returns to the recovery room postop.

## 2018-09-02 NOTE — Discharge Instructions (Signed)
Inpatient Rehab Discharge Instructions  Charles Marquez [facility] Discharge date and time: No discharge date for patient encounter.   Activities/Precautions/ Functional Status: Activity: activity as tolerated Diet: regular diet Wound Care: Keep wound clean and dry Functional status:  ___ No restrictions     ___ Walk up steps independently ___ 24/7 supervision/assistance   ___ Walk up steps with assistance ___ Intermittent supervision/assistance  ___ Bathe/dress independently ___ Walk with walker     _x__ Bathe/dress with assistance ___ Walk Independently    ___ Shower independently ___ Walk with assistance    ___ Shower with assistance ___ No alcohol     ___ Return to work/school ________  Special Instructions:  No driving  My questions have been answered and I understand these instructions. I will adhere to these goals and the provided educational materials after my discharge from the hospital.  Patient/Caregiver Signature _______________________________ Date __________  Clinician Signature _______________________________________ Date __________  Please bring this form and your medication list with you to all your follow-up doctor's appointments.

## 2018-09-02 NOTE — Brief Op Note (Signed)
09/02/2018  10:23 PM  PATIENT:  Charles Marquez  62 y.o. male  PRE-OPERATIVE DIAGNOSIS:  LEFT VP SHUNT  POST-OPERATIVE DIAGNOSIS:  * No post-op diagnosis entered *  PROCEDURE:  Procedure(s): SHUNT REVISION VENTRICULAR-PERITONEAL (Left)  SURGEON:  Surgeon(s) and Role:    * Julio Sicks, MD - Primary  PHYSICIAN ASSISTANT:   ASSISTANTS:    ANESTHESIA:   general  ZOX:[id number]  BLOOD ADMINISTERED:none  DRAINS: none   LOCAL MEDICATIONS USED:  NONE  SPECIMEN:  No Specimen  DISPOSITION OF SPECIMEN:  N/A  COUNTS:  YES  TOURNIQUET:  * No tourniquets in log *  DICTATION: .Dragon Dictation  PLAN OF CARE: Admit to inpatient   PATIENT DISPOSITION:  PACU - hemodynamically stable.   Delay start of Pharmacological VTE agent (>24hrs) due to surgical blood loss or risk of bleeding: yes

## 2018-09-02 NOTE — Progress Notes (Signed)
Occupational Therapy Session Note  Patient Details  Name: Charles Marquez MRN: [id number] Date of Birth: 03/13/1956  Today's Date: 09/02/2018 OT Individual Time: 1425-1510 OT Individual Time Calculation (min): 45 min    Short Term Goals: Week 1:  OT Short Term Goal 1 (Week 1): Pt will sequence oral care wiht no more than 1 VC OT Short Term Goal 2 (Week 1): Pt will sequence bathing body parts with no more than Min VC OT Short Term Goal 3 (Week 1): Pt will complete LB dressing with CGA OT Short Term Goal 4 (Week 1): Pt will don footwear wiht supervision for sitting balance  Skilled Therapeutic Interventions/Progress Updates:    Treatment session with focus on initiation, following one step commands, sit > stand, and standing balance.  Pt received upright in recliner with wife and daughter present.  Wife reports that pt was doing well until the weekend as is not doing as well today.  Mod assist sit > stand with max encouragement from wife.  Attempted to engage in ambulation with RW with pt initially shuffling feet and then stopping and would not take any more steps and actively attempting to sit back in recliner.  Completed stand pivot transfer max assist to Lt to w/c to attempt therapy session out of room to increase arousal.  Engaged in sit > stand at high-low table with mod assist and encouragement from daughter.  Attempted to have pt set up vertical checker board in standing to copy pattern.  Pt required mod assist to maintain upright standing and hand over hand to slide checker pieces up board, demonstrating decreased initiation and attention.  Therapist providing tactile cues for upright posture and chin up to attend to task.  Returned to sitting in w/c with attempts to have pt catch ball and place in small basketball hoop (as pt refereed basketball).  Therapist positioned to midline and Lt of midline for increased attention to task as pt would not attend to any stimulus on Rt.  Pt was able to  initiate catching small beach ball approx 50% of time.  Pt would not reach ball towards hoop; requiring hand over hand to place ball in basketball hoop, pt would initiate releasing ball 50% of time once placed on top of hoop. Returned to room via w/c and left upright in w/c with family present to provide supervision.  [name] and daughter expressing concerns with pt decreased attention, vision, and functional mobility. PA aware.  Therapy Documentation Precautions:  Precautions Precautions: Fall Precaution Comments: right lateral lean, R LE weakness, R inattention Other Brace/Splint: ventric drain, check with RN before seeing to see if it needs to be clamped (he has been unclamped for some of his sessions on purpose).  Restrictions Weight Bearing Restrictions: No Pain: Pain Assessment Pain Score: 0-No pain   Therapy/Group: Individual Therapy  Rosalio Loud 09/02/2018, 4:07 PM

## 2018-09-02 NOTE — Progress Notes (Signed)
RN called to room to assist family in getting patient out of restroom. Patient alert but not following directions. RN and student Nurse assisted patient from toilet to chair with max assist and cues to move legs. Wife voiced concerns that patient continues to decline functionally. RN updated PA on patients condition. No new orders at this time but neuro was consulted. Patients vitals are stable at this time. Follow up CT scheduled for tomorrow. Will continue to monitor patient's condition closely.

## 2018-09-02 NOTE — Anesthesia Preprocedure Evaluation (Signed)
Anesthesia Evaluation  Patient identified by MRN, date of birth, ID band Patient confused    Reviewed: Allergy & Precautions, H&P , NPO status , Patient's Chart, lab work & pertinent test results  Airway Mallampati: II  TM Distance: >3 FB Neck ROM: Full    Dental no notable dental hx. (+) Teeth Intact, Dental Advisory Given   Pulmonary neg pulmonary ROS,    Pulmonary exam normal breath sounds clear to auscultation       Cardiovascular negative cardio ROS   Rhythm:Regular Rate:Normal     Neuro/Psych Anxiety Hydrocephalus TIACVA, No Residual Symptoms    GI/Hepatic negative GI ROS, Neg liver ROS,   Endo/Other  negative endocrine ROS  Renal/GU negative Renal ROS  negative genitourinary   Musculoskeletal  (+) Arthritis , Osteoarthritis,    Abdominal   Peds  Hematology negative hematology ROS (+)   Anesthesia Other Findings   Reproductive/Obstetrics negative OB ROS                             Lab Results  Component Value Date   WBC 5.5 09/02/2018   HGB 12.1 (L) 09/02/2018   HCT 36.2 (L) 09/02/2018   MCV 91.2 09/02/2018   PLT 176 09/02/2018   Lab Results  Component Value Date   CREATININE 0.79 09/01/2018   BUN 10 09/01/2018   NA 132 (L) 09/01/2018   K 3.9 09/01/2018   CL 95 (L) 09/01/2018   CO2 31 09/01/2018    Anesthesia Physical  Anesthesia Plan  ASA: III and emergent  Anesthesia Plan: General   Post-op Pain Management:    Induction: Intravenous  PONV Risk Score and Plan: 3 and Ondansetron, Dexamethasone and Treatment may vary due to age or medical condition  Airway Management Planned: Oral ETT  Additional Equipment:   Intra-op Plan:   Post-operative Plan: Extubation in OR and Possible Post-op intubation/ventilation  Informed Consent: I have reviewed the patients History and Physical, chart, labs and discussed the procedure including the risks, benefits and  alternatives for the proposed anesthesia with the patient or authorized representative who has indicated his/her understanding and acceptance.   Dental advisory given  Plan Discussed with: CRNA  Anesthesia Plan Comments:         Anesthesia Quick Evaluation

## 2018-09-02 NOTE — Progress Notes (Addendum)
Physical Therapy Session Note  Patient Details  Name: Charles Marquez MRN: [id number] Date of Birth: 29-Jan-1956  Today's Date: 09/02/2018 PT Individual Time: 1120-1205 PT Individual Time Calculation (min): 45 min   Short Term Goals: Week 1:  PT Short Term Goal 1 (Week 1): Pt will negotiate 12 steps w/ min assist and unilateral rail support PT Short Term Goal 2 (Week 1): Pt will attend to R environment during functional mobilty w/o cues 50% of the time PT Short Term Goal 3 (Week 1): Pt will follow simple 1-step commands 100% of the time during functional mobility  PT Short Term Goal 4 (Week 1): Pt will maintain dynamic standing balance w/ CGA PT Short Term Goal 5 (Week 1): Pt will demonstrate appropriate safety awareness w/o cues 50% of the time during functional mobility   Skilled Therapeutic Interventions/Progress Updates:   Pt resting in recliner; dtr and wife present.  Pt asleep and awakened with difficulty.  Pt drowsy during first few minutes of session, but then was awake and alert.  Gait training without AD for neuro re-ed RUE and RLE, x 50', with RW x 50' including turns, with min/mod assist; pt has fair clearance of R foot but has limited improvement with cueing.  Stand > sit required multimodal cues due to apraxia; eventually he sat while holding hands with dtr on his L, as she sat.  Therapeutic activity in sitting to promote RUE use, upright trunk and head, passing large therapy ball back and forth with wife, without assistance, and bouncing ball back and forth to wife with hand over hand assistance. In standing, pt used R/L hands to grasp cards and place on board in front and to R of him; 0/9 matches; min assist for balance.  Multimodal cues for R visual attention, use of R hand, turning head to R.   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 Dual task of attending to room numbers on R with manual cues to turn head.  Difficult to assess if pt was reading numbers.   Transfer to recliner with improved motor planning to sit when L hand placed on armrest of recliner.   Pt left resting in recliner, sitting, upright and set up for lunch with family.     Therapy Documentation Precautions:  Precautions Precautions: Fall Precaution Comments: right lateral lean, R LE weakness, R inattention Other Brace/Splint: ventric drain, check with RN before seeing to see if it needs to be clamped (he has been unclamped for some of his sessions on purpose).  Restrictions Weight Bearing Restrictions: No   Pain: Pain Assessment Pain Scale: 0-10 Pain Score: 0-No pain    Therapy/Group: Individual Therapy  Charles Marquez 09/02/2018, 12:09 PM

## 2018-09-02 NOTE — Progress Notes (Signed)
RN notified by Dr.Pool,  who is at bedside that patient will be going to surgery tonight. Family was notified that patient is now NPO status and explained the pre-op procedures. CHG bath was given and pre-op checklist was completed. Consent Form is signed and placed in chart. Will pass this information on to oncoming nursing staff.

## 2018-09-02 NOTE — Transfer of Care (Signed)
Immediate Anesthesia Transfer of Care Note  Patient: Charles Marquez  Procedure(s) Performed: Left Occipital VP shunt revision (Left Head)  Patient Location: PACU  Anesthesia Type:General  Level of Consciousness: sedated and responds to stimulation  Airway & Oxygen Therapy: Patient connected to nasal cannula oxygen  Post-op Assessment: Report given to RN and Post -op Vital signs reviewed and stable  Post vital signs: Reviewed and stable  Last Vitals:  Vitals Value Taken Time  BP 148/90 09/02/2018 10:41 PM  Temp    Pulse 57 09/02/2018 10:45 PM  Resp 14 09/02/2018 10:45 PM  SpO2 100 % 09/02/2018 10:45 PM  Vitals shown include unvalidated device data.  Last Pain: There were no vitals filed for this visit.       Complications: No apparent anesthesia complications

## 2018-09-02 NOTE — Progress Notes (Signed)
Patient with cognitive and functional decline over the weekend.  No fever.  No evidence of infection.  CT scan of head demonstrates good decompression of his left lateral ventricle third ventricle and fourth ventricle however his right lateral ventricle remains enlarged.  VP shunt reprogrammed to lower level.  Shunt pumps and refills well.  Patient does not appear to be toxic.  Will observe today after reprogramming valve.  If patient fails to make improvement and follow-up head CT scan tomorrow continues to show increased right lateral ventricular size we may consider shunt exploration.

## 2018-09-02 NOTE — Progress Notes (Signed)
[facility] PHYSICAL MEDICINE & REHABILITATION PROGRESS NOTE  Subjective/Complaints: Patient seen laying in bed this morning.  He is sleepy.  Wife at bedside who states patient slept fairly overnight.  She also reports adjustment of VP shunt last night by neurosurgery after imaging.  ROS: Unable to assess due to aphasia  Objective: Vital Signs: Blood pressure 129/80, pulse 69, temperature 98.6 F (37 C), temperature source Oral, resp. rate 14, height 5\' 11"  (1.803 m), weight 79.3 kg, SpO2 98 %. Dg Skull 1-3 Views  Result Date: 09/01/2018 CLINICAL DATA:  Obstructive hydrocephalus EXAM: SKULL - 1-3 VIEW COMPARISON:  Head CT from earlier the same day FINDINGS: Right parietal shunt with continuous tubing where radiodense. There is mild tube narrowing between the reservoir and valve due to angle. No significant osseous finding. IMPRESSION: Right parietal VP shunt. The tubing between the reservoir and valve is mildly narrowed due to curvature. Electronically Signed   By: Marnee Spring M.D.   On: 09/01/2018 19:09   Dg Chest 1 View  Result Date: 09/01/2018 CLINICAL DATA:  Shunt series. EXAM: CHEST  1 VIEW COMPARISON:  08/08/2018 FINDINGS: Left chest wall shunt catheter noted which appears intact. Loop recorder device in the left chest wall. Heart is mildly enlarged. No confluent airspace opacities, effusions or edema. No acute bony abnormality. IMPRESSION: Mild cardiomegaly.  No active disease. Left chest wall shunt catheter appears intact. Electronically Signed   By: Charlett Nose M.D.   On: 09/01/2018 19:13   Ct Head Wo Contrast  Result Date: 09/01/2018 CLINICAL DATA:  Shunt revision 6 days ago. Change in balance. Subacute neuro deficits. Lethargy. EXAM: CT HEAD WITHOUT CONTRAST TECHNIQUE: Contiguous axial images were obtained from the base of the skull through the vertex without intravenous contrast. COMPARISON:  CT head without contrast 08/27/2018 and 08/26/2018. FINDINGS: Brain: Left parietal  ventriculostomy catheter is stable. The catheter traverses septum occlusive M, terminating in the right lateral ventricle. Right ventricle remains asymmetrically enlarged compared to the left. Lateral ventricle size is stable. White matter hypoattenuation around the ventriculostomy tract is stable. Chronic white matter disease is otherwise stable. Brainstem and cerebellum are normal. Vascular: No hyperdense vessel or unexpected calcification. Skull: Bilateral parietal burr holes are present. A right frontal burr hole is present. Focal lytic or blastic lesions are present. Sinuses/Orbits: The paranasal sinuses clear. Mastoid air cells are clear. IMPRESSION: 1. Stable appearance of ventriculomegaly with unchanged positioning of ventriculostomy catheter. 2. Moderate white matter disease is stable. Electronically Signed   By: Marin Roberts M.D.   On: 09/01/2018 12:34   Dg Abd 2 Views  Result Date: 09/01/2018 CLINICAL DATA:  Obstructive hydrocephalus. EXAM: ABDOMEN - 2 VIEW COMPARISON:  Radiographs of August 04, 2018. FINDINGS: The bowel gas pattern is normal. There is no evidence of free air. Visualized portion of ventriculoperitoneal shunt appears intact with tip in left lower quadrant. No radio-opaque calculi or other significant radiographic abnormality is seen. IMPRESSION: No evidence of bowel obstruction or ileus. Visualized portion of ventriculoperitoneal shunt appears intact. Electronically Signed   By: Lupita Raider, M.D.   On: 09/01/2018 19:16   Recent Labs    09/01/18 1057 09/02/18 0624  WBC 5.5 5.5  HGB 11.6* 12.1*  HCT 34.1* 36.2*  PLT 148* 176   Recent Labs    09/01/18 0624 09/01/18 1057  NA 133* 132*  K 3.8 3.9  CL 96* 95*  CO2 29 31  GLUCOSE 98 98  BUN 10 10  CREATININE 0.72 0.79  CALCIUM 8.4*  8.5*    Physical Exam: BP 129/80 (BP Location: Right Arm)   Pulse 69   Temp 98.6 F (37 C) (Oral)   Resp 14   Ht 5\' 11"  (1.803 m)   Wt 79.3 kg   SpO2 98%   BMI  24.39 kg/m  Constitutional:NAD.  Vital signs reviewed. HENT: Normocephalic.  Sutures in place. Eyes:EOMI.  No discharge. Cardiovascular:Normal rate and regular rhythm.  No JVD. Respiratory:Effort normal.  Clear. ZO:XWRU.  Nontender. Musculoskeletal: He exhibits noedema or tenderness in extremities.  Neurological:  Somnolent Delayed processing.  Difficulty following commands  Seen moving all 4 extremities Skin: Skin iswarm. He isnot diaphoretic.Scalp, abdominal incisions clean  Assessment/Plan: 1. Functional deficits secondary to VP shunt malfunction/infection status post revision which require 3+ hours per day of interdisciplinary therapy in a comprehensive inpatient rehab setting.  Physiatrist is providing close team supervision and 24 hour management of active medical problems listed below.  Physiatrist and rehab team continue to assess barriers to discharge/monitor patient progress toward functional and medical goals  Care Tool:  Bathing    Body parts bathed by patient: Right arm, Left arm, Chest, Abdomen, Front perineal area, Buttocks, Right upper leg, Left upper leg, Right lower leg, Left lower leg, Face   Body parts bathed by helper: Face, Front perineal area, Left arm, Left upper leg Body parts n/a: Abdomen, Chest, Right arm, Buttocks, Right lower leg, Left lower leg, Right upper leg   Bathing assist Assist Level: Moderate Assistance - Patient 50 - 74%     Upper Body Dressing/Undressing Upper body dressing   What is the patient wearing?: Pull over shirt    Upper body assist Assist Level: Minimal Assistance - Patient > 75%    Lower Body Dressing/Undressing Lower body dressing      What is the patient wearing?: Incontinence brief     Lower body assist Assist for lower body dressing: Minimal Assistance - Patient > 75%     Toileting Toileting Toileting Activity did not occur (Clothing management and hygiene only): N/A (no void or bm)  Toileting  assist Assist for toileting: Minimal Assistance - Patient > 75%     Transfers Chair/bed transfer  Transfers assist     Chair/bed transfer assist level: Moderate Assistance - Patient 50 - 74%     Locomotion Ambulation   Ambulation assist      Assist level: Minimal Assistance - Patient > 75% Assistive device: Walker-rolling Max distance: 150'   Walk 10 feet activity   Assist     Assist level: Minimal Assistance - Patient > 75% Assistive device: Walker-rolling   Walk 50 feet activity   Assist    Assist level: Minimal Assistance - Patient > 75% Assistive device: Walker-rolling    Walk 150 feet activity   Assist    Assist level: Minimal Assistance - Patient > 75% Assistive device: Walker-rolling    Walk 10 feet on uneven surface  activity   Assist Walk 10 feet on uneven surfaces activity did not occur: Safety/medical concerns         Wheelchair     Assist Will patient use wheelchair at discharge?: No(anticipate pt will be primary ambulator at home)   Wheelchair activity did not occur: N/A         Wheelchair 50 feet with 2 turns activity    Assist    Wheelchair 50 feet with 2 turns activity did not occur: N/A       Wheelchair 150 feet activity  Assist Wheelchair 150 feet activity did not occur: N/A         Medical Problem List and Plan: 1.Decreased functional mobility with unsteady gait and aphasiasecondary to VP shunt malfunction/infection. Status post shunt revision 08/26/2018 and reprogramming on 10/20  Begin CIR  Notes reviewed-VP shunt malfunction, no notes from your neurosurgery yesterday, images reviewed-hydrocephalus, labs reviewed 2. DVT Prophylaxis/Anticoagulation: Subcutaneous heparin initiated 08/29/2018. Monitor for any signs of DVT 3. Pain Management:Hydrocodone as needed 4. Mood:Provide emotional support 5. Neuropsych: This patientisnot capable of making decisions on hisown behalf. 6.  Skin/Wound Care:Routine skin checks 7. Fluids/Electrolytes/Nutrition:Routine in and outs  8.Constipation. Laxative assistance 9.  Acute blood loss anemia  Hemoglobin 12.1 on 10/21  Continue to monitor 10.  Hyponatremia  Sodium 132 on 10/20  Continue to monitor  LOS: 3 days A FACE TO FACE EVALUATION WAS PERFORMED  Ankit Karis Juba 09/02/2018, 8:10 AM

## 2018-09-02 NOTE — Progress Notes (Signed)
Physical Therapy Session Note  Patient Details  Name: Charles Marquez MRN: [id number] Date of Birth: 1956/02/19  Today's Date: 09/02/2018 PT Individual Time: 0900-0955 PT Individual Time Calculation (min): 55 min   Short Term Goals: Week 1:  PT Short Term Goal 1 (Week 1): Pt will negotiate 12 steps w/ min assist and unilateral rail support PT Short Term Goal 2 (Week 1): Pt will attend to R environment during functional mobilty w/o cues 50% of the time PT Short Term Goal 3 (Week 1): Pt will follow simple 1-step commands 100% of the time during functional mobility  PT Short Term Goal 4 (Week 1): Pt will maintain dynamic standing balance w/ CGA PT Short Term Goal 5 (Week 1): Pt will demonstrate appropriate safety awareness w/o cues 50% of the time during functional mobility   Skilled Therapeutic Interventions/Progress Updates:    Pt received seated in w/c in room, agreeable to PT. No complaints of pain. [name] [name] present during therapy session. Pt performs sit to stand with min A throughout therapy session with tactile and verbal cues for initiation due to decreased command following. Ambulation x 50 ft with no AD and min to mod A, heavy reliance on "furniture walking" reaching for rail in hallway and countertop of nurses station for balance. Pt exhibits shuffling gait pattern and flexed trunk and B knees with gait with no AD. Ambulation 2 x 150 ft with RW and min A for balance, improved upright posture and overall balance but still exhibits shuffling gait pattern and needs mod directional cueing. Standing alt L/R 3" step taps with BUE support and min A for balance, max multimodal cueing for sequencing and task performance. Attempt standing horseshoe toss, pt unable to sequence, transition to seated horseshoe toss with max multimodal cues for task performance to work on scanning to [employer]. Toilet transfer with min A, min A for clothing management and pericare due to poor task initiation  and sequencing. Pt does better with single word verbal cueing throughout therapy session as well as tactile cueing and task demonstration. Per pt's wife pt has had a decline in function since last week, RN and medical team aware. Pt left seated in recliner in room with needs in reach, wife at side.  Therapy Documentation Precautions:  Precautions Precautions: Fall Precaution Comments: right lateral lean, R LE weakness, R inattention Other Brace/Splint: ventric drain, check with RN before seeing to see if it needs to be clamped (he has been unclamped for some of his sessions on purpose).  Restrictions Weight Bearing Restrictions: No   Therapy/Group: Individual Therapy  Peter Congo, PT, DPT  09/02/2018, 9:56 AM

## 2018-09-02 NOTE — Progress Notes (Signed)
Patient information reviewed and entered into eRehab system by Ade Stmarie, RN, CRRN, PPS Coordinator.  Information including medical coding, functional ability and quality indicators will be reviewed and updated through discharge.    

## 2018-09-02 NOTE — Progress Notes (Addendum)
Speech Language Pathology Daily Session Note  Patient Details  Name: Charles Marquez MRN: [id number] Date of Birth: 10/10/1956  Today's Date: 09/02/2018 SLP Individual Time: 1000-1103 SLP Individual Time Calculation (min): 63 min  Short Term Goals: Week 1: SLP Short Term Goal 1 (Week 1): Pt will follow 1 step commands in a functional context in >50% of opportunities with mod assist multimodal cues.   SLP Short Term Goal 2 (Week 1): Pt will answer basic yes/no questions to convey his needs and wants to caregivers for >50% accuracy with mod assist mulitmodal cues.   SLP Short Term Goal 3 (Week 1): Pt will name basic familiar objects for 50% accuracy with mod assist multimodal cues.    Skilled Therapeutic Interventions:Skilled ST services focused on speech skills and family education. Pt demonstrated automatic speech responding to greetings "hey", "yeah" and "10 years" (how long they lived in [place] when he worked for [employer]) and "thank you" at end of therapy session. Pt expressed "yeah", "alright" and "okay" with little intelligible speech, paraphasic jargon, noted in between as well as no awareness of errors. SLP facilitated matching objects to photo in a field of 2 and object to word in a field of 2 with 50% accuracy given max A multimodal cues. Pt demonstrated little to no comprehension of yes/no questions pertaining to naming objects and family members given labeled photo album, responding only "yeah".  Pt was unable to repeat name of object given max A written and verbal cues, or repeat at word level pt's name/family member names. Pt demonstrated increased impairment in attention level, requiring max-mod A verbal/visual cues for focused attention. [name] daughter and wife present in therapy session, expressing concerns of decline since 10/17 ST session. SLP provided education of current goals set and ways to aid in express and comprehension at a basic level, all questions were answered to  satisfaction. Pt has demonstrated decline in comprehension and expression of language. Pt was left in room with call bell within reach and bed alarm set. Recommend to continue skilled ST services.      Pain Pain Assessment Pain Score: 0-No pain  Therapy/Group: Individual Therapy  Ellise Kovack  [facility] 09/02/2018, 12:19 PM

## 2018-09-02 NOTE — Progress Notes (Signed)
Social Work  Social Work Assessment and Plan  Patient Details  Name: Charles Marquez MRN: [id number] Date of Birth: [birth year]  Today's Date: 09/02/2018  Problem List:  Patient Active Problem List   Diagnosis Date Noted  . Acute blood loss anemia   . S/P VP shunt   . Hydrocephalus (HCC) 08/30/2018  . Dyslipidemia   . History of CVA (cerebrovascular accident)   . Benign essential HTN   . Tachycardia   . Leukocytosis   . Hyponatremia   . Hypokalemia   . 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 Bacterial meningitis 08/09/2018  . TIA (transient ischemic attack) 01/29/2017  . Acute encephalopathy   . Shunt malfunction 03/13/2016  . 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 Hyperlipidemia 10/20/2014  . Degenerative disc disease, lumbar 04/15/2013  . Routine general medical examination at a health care facility 07/23/2012  . DISTURBANCE OF SKIN SENSATION 10/05/2008  . HYPERLIPIDEMIA 01/01/2008  . MYCOPLASMA PNEUMONIA 01/01/2008   Past Medical History:  Past Medical History:  Diagnosis Date  . Anxiety   . Hypercholesteremia   . Stroke (HCC)    tia's  . TIA (transient ischemic attack)    09.15   Past Surgical History:  Past Surgical History:  Procedure Laterality Date  . Fractured arm Left 12  . HERNIA REPAIR Right 3/12  . LAPAROSCOPIC REVISION VENTRICULAR-PERITONEAL (V-P) SHUNT N/A 08/26/2018   Procedure: LAPAROSCOPIC INSERTION VENTRICULAR-PERITONEAL (V-P) SHUNT;  Surgeon: Julio Sicks, MD;  Location: [facility] OR;  Service: Neurosurgery;  Laterality: N/A;  . LOOP RECORDER INSERTION N/A 04/10/2017   Procedure: Loop Recorder Insertion;  Surgeon: Hillis Range, MD;  Location: [facility] INVASIVE CV LAB;  Service: Cardiovascular;  Laterality: N/A;  . SHUNT REMOVAL Right 03/13/2016   Procedure: SHUNT REMOVAL;  Surgeon: Julio Sicks, MD;  Location: [facility] NEURO ORS;  Service: Neurosurgery;   Laterality: Right;  . SHUNT REMOVAL Right 08/09/2018   Procedure: SHUNT REMOVAL With Placement of Ventricular Catheter;  Surgeon: Lisbeth Renshaw, MD;  Location: [facility] OR;  Service: Neurosurgery;  Laterality: Right;  . SHUNT REVISION Right 08/05/2018   Procedure: SHUNT REVISION;  Surgeon: Julio Sicks, MD;  Location: [facility] OR;  Service: Neurosurgery;  Laterality: Right;  . SHUNT REVISION VENTRICULAR-PERITONEAL Left 08/26/2018   Procedure: SHUNT REVISION VENTRICULAR-PERITONEAL;  Surgeon: Julio Sicks, MD;  Location: [facility] OR;  Service: Neurosurgery;  Laterality: Left;  [name] VASECTOMY  10/02/1997  . 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 VENTRICULOPERITONEAL SHUNT Right 07/22/2018   Procedure: Shunt Placment right occipital;  Surgeon: Julio Sicks, MD;  Location: [facility] OR;  Service: Neurosurgery;  Laterality: Right;  . VENTRICULOPERITONEAL SHUNT Left 08/26/2018   Procedure: LEFT SIDED VENTRICULAR-PERITONEAL SHUNT;  Surgeon: Julio Sicks, MD;  Location: [facility] OR;  Service: Neurosurgery;  Laterality: Left;  [name] VENTRICULOSTOMY Right 08/09/2018   Procedure: VENTRICULOSTOMY;  Surgeon: Lisbeth Renshaw, MD;  Location: [facility] OR;  Service: Neurosurgery;  Laterality: Right;   Social History:  reports that he has never smoked. He has never used smokeless tobacco. He reports that he drinks alcohol. He reports that he does not use drugs.  Family / Support Systems Marital Status: Married Patient Roles: Spouse, Parent, Other (Comment)(employee) Spouse/Significant Other: Kim [phone]-home  [phone] Children: Michelle Osabel-daughter [phone]-cell  Local son also Other Supports: Friends and church members Anticipated Caregiver: Wife Ability/Limitations of Caregiver: Wife is taking a FMLA from school Caregiver Availability:  24/7 Family Dynamics: Close knit family they have two children who are local and involved and supportive. They also have friends and church members who visit and provide support.  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 Had been laided off [employer] 03/2018 Legal Hisotry/Current Legal Issues: Has health insurance until 09/2018 from [employer] then wife will need to look into [employer] or the marketplace insuranc euntil she can add him to hers in 11/2018 Guardian/Conservator: None-according to MD pt is capable of making his own decisions while here. Wife may need to get POA to talk with insurance people   Abuse/Neglect Abuse/Neglect Assessment Can Be Completed: Yes Physical Abuse: Denies Verbal Abuse: Denies Sexual Abuse: Denies Exploitation of patient/patient's resources: Denies Self-Neglect: Denies  Emotional Status [name] affect, behavior adn adjustment status: Pt is slow in his responses and is extremely tired from therapies. Wife answered the questions and provided information. Pt was doing well until 07/2018 when shunt malfunctioned and he had to have it replaced. It has been back and forth in the hospital since this time. He is trying to remain independent and recover from this. Recent Psychosocial Issues: health issues causing to have VP shunt and stroke he had in 2018. Constant back and forth since 07/2018 Pyschiatric History: Hx of anxiety takes medications for this and finds them helpful. Would benefit from seeing neuro-psych while here due to the long road he has been on since 2016 when first had shunt. Will make referral for him to be seen while here Substance Abuse History: No issues  Patient / Family Perceptions, Expectations &  Goals Pt/Family understanding of illness & functional limitations: Pt and wife can explain his procedures and the treatment plan going forward. Wife reports he has been having the shunt adjusted and actually was adjusted last night since he was not acting right and MD was called. She hopes it can be set so pt can actually being the recvoery process. Wife is very close wiht neuro-surgeon and talks to him daily. Premorbid pt/family roles/activities: Husband, father, employee, friend, church member, etc Anticipated changes in roles/activities/participation: resume Pt/family expectations/goals: Pt states: " I want to get better and get home."  Wife states: " I hope he can improve and get better without having set backs."  [employer]: None Premorbid Home Care/DME Agencies: None Transportation available at discharge: Wife Resource referrals recommended: Neuropsychology, Support group (specify)  Discharge Planning Living Arrangements: Spouse/significant other Support Systems: Spouse/significant other, Children, Psychologist, clinical community, Friends/neighbors Type of Residence: Private residence Insurance Resources: Media planner (specify)([place]) Financial Resources: Employment, Garment/textile technologist Screen Referred: No Living Expenses: Database administrator Management: Spouse Does the patient have any problems obtaining your medications?: No Home Management: Wife Patient/Family Preliminary Plans: Return home with wife who has taken a FMLA from school to provide care to pt. Currently his insurance will only cover 7 days but will need to push them to cover more since therapy team wants him to stay here 10-15 days. He did have set back with his shunt still not functioning well. Discussed getting insurance coverage for after Nov when his insurance terms.  Sw  Barriers to Discharge: Medical stability Sw Barriers to Discharge Comments: Seems to need daily medical management and  stability Social Work Anticipated Follow Up Needs: HH/OP, Support Group  Clinical Impression Pleasant tired gentleman who is working hard in his therapies and trying to recover from his medical issues. Wife is stressed understandably so with trying to keep their household going and provide support to husband, along with dealing with the insurance issues. Will assist with insurance options, wife to look into cobra options. Will work on discharge needs and provide support to wife. Will ask neuro-psych to see pt while here.   Lucy Chris 09/02/2018, 3:08 PM

## 2018-09-03 ENCOUNTER — Encounter (HOSPITAL_COMMUNITY): Payer: Self-pay | Admitting: Neurosurgery

## 2018-09-03 ENCOUNTER — Inpatient Hospital Stay (HOSPITAL_COMMUNITY): Payer: BLUE CROSS/BLUE SHIELD

## 2018-09-03 ENCOUNTER — Inpatient Hospital Stay (HOSPITAL_COMMUNITY): Payer: BLUE CROSS/BLUE SHIELD | Admitting: Physical Therapy

## 2018-09-03 ENCOUNTER — Inpatient Hospital Stay (HOSPITAL_COMMUNITY): Payer: BLUE CROSS/BLUE SHIELD | Admitting: Occupational Therapy

## 2018-09-03 ENCOUNTER — Inpatient Hospital Stay (HOSPITAL_COMMUNITY): Payer: BLUE CROSS/BLUE SHIELD | Admitting: Speech Pathology

## 2018-09-03 LAB — BASIC METABOLIC PANEL
ANION GAP: 8 (ref 5–15)
BUN: 8 mg/dL (ref 6–20)
CHLORIDE: 95 mmol/L — AB (ref 98–111)
CO2: 30 mmol/L (ref 22–32)
CREATININE: 0.94 mg/dL (ref 0.61–1.24)
Calcium: 8.8 mg/dL — ABNORMAL LOW (ref 8.9–10.3)
GFR calc non Af Amer: >60 mL/min (ref >60)
Glucose, Bld: 118 mg/dL — ABNORMAL HIGH (ref 70–99)
Potassium: 4.1 mmol/L (ref 3.5–5.1)
Sodium: 133 mmol/L — ABNORMAL LOW (ref 135–145)

## 2018-09-03 LAB — CBC
HEMATOCRIT: 38.4 % — AB (ref 39.0–52.0)
Hemoglobin: 12.5 g/dL — ABNORMAL LOW (ref 13.0–17.0)
MCH: 30.3 pg (ref 26.0–34.0)
MCHC: 32.6 g/dL (ref 30.0–36.0)
MCV: 93.2 fL (ref 80.0–100.0)
NRBC: 0 % (ref 0.0–0.2)
PLATELETS: 150 10*3/uL (ref 150–400)
RBC: 4.12 MIL/uL — AB (ref 4.22–5.81)
RDW: 12.9 % (ref 11.5–15.5)
WBC: 6.3 10*3/uL (ref 4.0–10.5)

## 2018-09-03 LAB — CSF CELL COUNT WITH DIFFERENTIAL
Lymphs, CSF: 8 % — ABNORMAL LOW (ref 40–80)
Monocyte-Macrophage-Spinal Fluid: 21 % (ref 15–45)
RBC COUNT CSF: 590 /mm3 — AB
Segmented Neutrophils-CSF: 71 % — ABNORMAL HIGH (ref 0–6)
WBC CSF: 160 /mm3 — AB (ref 0–5)

## 2018-09-03 MED ORDER — ACETAMINOPHEN 500 MG PO TABS: 500.0000 mg | ORAL_TABLET | Freq: Four times a day (QID) | ORAL | Status: DC | PRN

## 2018-09-03 MED ORDER — ACETAMINOPHEN 325 MG PO TABS: 650.0000 mg | ORAL_TABLET | ORAL | Status: DC | PRN

## 2018-09-03 MED ORDER — ACETAMINOPHEN 650 MG RE SUPP: 650.0000 mg | RECTAL | Status: DC | PRN

## 2018-09-03 MED ORDER — VANCOMYCIN HCL 10 G IV SOLR
1250.0000 mg | Freq: Two times a day (BID) | INTRAVENOUS | Status: DC
  Administered 2018-09-03 – 2018-09-04 (×3): 1250 mg via INTRAVENOUS
  Filled 2018-09-03 (×4): qty 1250

## 2018-09-03 MED ORDER — PROSIGHT PO TABS
1.0000 | ORAL_TABLET | Freq: Every day | ORAL | Status: DC
  Administered 2018-09-03 – 2018-09-10 (×8): 1 via ORAL
  Filled 2018-09-03 (×8): qty 1

## 2018-09-03 MED ORDER — FLEET ENEMA 7-19 GM/118ML RE ENEM: 1.0000 | ENEMA | Freq: Once | RECTAL | Status: DC | PRN

## 2018-09-03 MED ORDER — ONDANSETRON HCL 4 MG PO TABS: 4.0000 mg | ORAL_TABLET | Freq: Four times a day (QID) | ORAL | Status: DC | PRN

## 2018-09-03 MED ORDER — BISACODYL 5 MG PO TBEC
5.0000 mg | DELAYED_RELEASE_TABLET | Freq: Every day | ORAL | Status: DC | PRN
  Administered 2018-09-03: 5 mg via ORAL
  Filled 2018-09-03: qty 1

## 2018-09-03 MED ORDER — DOCUSATE SODIUM 100 MG PO CAPS
100.0000 mg | ORAL_CAPSULE | Freq: Two times a day (BID) | ORAL | Status: DC
  Administered 2018-09-03 – 2018-09-10 (×15): 100 mg via ORAL
  Filled 2018-09-03 (×15): qty 1

## 2018-09-03 MED ORDER — DEXTROSE-NACL 5-0.9 % IV SOLN
INTRAVENOUS | Status: DC
  Administered 2018-09-03 (×2): via INTRAVENOUS

## 2018-09-03 MED ORDER — SALINE SPRAY 0.65 % NA SOLN
1.0000 | NASAL | Status: DC | PRN
  Administered 2018-09-06 – 2018-09-09 (×2): 1 via NASAL
  Filled 2018-09-03: qty 44

## 2018-09-03 MED ORDER — TRAMADOL HCL 50 MG PO TABS: 50.0000 mg | ORAL_TABLET | Freq: Every evening | ORAL | Status: DC | PRN

## 2018-09-03 MED ORDER — MAGIC MOUTHWASH
5.0000 mL | Freq: Four times a day (QID) | ORAL | Status: DC | PRN
  Administered 2018-09-06: 5 mL via ORAL
  Filled 2018-09-03 (×2): qty 5

## 2018-09-03 MED ORDER — POLYETHYLENE GLYCOL 3350 17 G PO PACK
17.0000 g | PACK | Freq: Every day | ORAL | Status: DC | PRN
  Filled 2018-09-03: qty 1

## 2018-09-03 MED ORDER — BISACODYL 10 MG RE SUPP: 10.0000 mg | Freq: Every day | RECTAL | Status: DC | PRN

## 2018-09-03 MED ORDER — ONDANSETRON HCL 4 MG PO TABS: 4.0000 mg | ORAL_TABLET | ORAL | Status: DC | PRN

## 2018-09-03 MED ORDER — FAMOTIDINE IN NACL 20-0.9 MG/50ML-% IV SOLN
20.0000 mg | Freq: Two times a day (BID) | INTRAVENOUS | Status: DC
  Administered 2018-09-03 (×3): 20 mg via INTRAVENOUS
  Filled 2018-09-03 (×4): qty 50

## 2018-09-03 MED ORDER — ONDANSETRON HCL 4 MG/2ML IJ SOLN: 4.0000 mg | INTRAMUSCULAR | Status: DC | PRN

## 2018-09-03 MED ORDER — HYDROMORPHONE HCL 1 MG/ML IJ SOLN: 0.5000 mg | INTRAMUSCULAR | Status: DC | PRN

## 2018-09-03 MED ORDER — ENSURE ENLIVE PO LIQD
237.0000 mL | Freq: Two times a day (BID) | ORAL | Status: DC
  Administered 2018-09-03 – 2018-09-10 (×15): 237 mL via ORAL

## 2018-09-03 MED ORDER — LABETALOL HCL 5 MG/ML IV SOLN: 10.0000 mg | INTRAVENOUS | Status: DC | PRN

## 2018-09-03 MED ORDER — HYDROCODONE-ACETAMINOPHEN 5-325 MG PO TABS: 1.0000 | ORAL_TABLET | ORAL | Status: DC | PRN

## 2018-09-03 MED ORDER — NALOXONE HCL 0.4 MG/ML IJ SOLN: 0.0800 mg | INTRAMUSCULAR | Status: DC | PRN

## 2018-09-03 MED ORDER — PROMETHAZINE HCL 12.5 MG PO TABS
12.5000 mg | ORAL_TABLET | ORAL | Status: DC | PRN
  Filled 2018-09-03: qty 2

## 2018-09-03 MED ORDER — ATORVASTATIN CALCIUM 40 MG PO TABS
40.0000 mg | ORAL_TABLET | Freq: Every day | ORAL | Status: DC
  Administered 2018-09-03 – 2018-09-10 (×8): 40 mg via ORAL
  Filled 2018-09-03 (×8): qty 1

## 2018-09-03 NOTE — Progress Notes (Signed)
Received message from [place] with [employer], requesting update.  Called back; left message on voicemail.    Quintella Baton, RN, BSN  Trauma/Neuro ICU Case Manager [phone]

## 2018-09-03 NOTE — Progress Notes (Signed)
Pharmacy Antibiotic Note  Charles Marquez is a 62 y.o. male admitted on 09/02/2018 with surgical prophylaxis.  Pharmacy has been consulted for vancomycin dosing.  Plan: Vancomycin 1250 IV every 12 hours.  Goal trough 10-15 mcg/mL.  F/u renal function, clincical course, LOT  Weight: 177 lb 14.6 oz (80.7 kg)  Temp (24hrs), Avg:98.1 F (36.7 C), Min:97.5 F (36.4 C), Max:98.6 F (37 C)  Recent Labs  Lab 08/29/18 0344 08/30/18 1645 09/01/18 0624 09/01/18 1057 09/02/18 0624  WBC  --  7.8  --  5.5 5.5  CREATININE  --  0.74 0.72 0.79  --   VANCOTROUGH 7*  --   --   --   --     Estimated Creatinine Clearance: 104.6 mL/min (by C-G formula based on SCr of 0.79 mg/dL).    No Known Allergies   Thank you for allowing pharmacy to be a part of this patient's care.  Talbert Cage P[place]et 09/03/2018 12:26 AM

## 2018-09-03 NOTE — Progress Notes (Signed)
Patients vitals remained stable since transfer to floor.  Patient has has expressive aphasia and difficulty understanding commands which is reported by wife to be consistent with condition upon admission.  Does follow commands with some repeating of instruction.  Right sided weakness is still present as was upon admission but can move upper and lower right side extremities against gravity and to command.  Left extremities are weak but otherwise have normal function.  Awaiting CT in morning.

## 2018-09-03 NOTE — Plan of Care (Signed)

## 2018-09-03 NOTE — Progress Notes (Signed)
Inpatient Rehabilitation Admissions Coordinator  Pt admitted to inpt rehab on 10/18.Pt readmitted to acute. Noted shunt revision last night after decline in function this weekend. Please reorder PT, OT, and SLP when appropriate to assist with planning dispo. [employer] [place] will have to approve any venue needed. I will follow.  Ottie Glazier, RN, MSN Rehab Admissions Coordinator [phone] 09/03/2018 7:59 AM

## 2018-09-03 NOTE — Progress Notes (Signed)
Initial Nutrition Assessment  DOCUMENTATION CODES:   Not applicable  INTERVENTION:  Continue Ensure Enlive po BID, each supplement provides 350 kcal and 20 grams of protein.  Encourage adequate PO intake.   NUTRITION DIAGNOSIS:   Increased nutrient needs related to post-op healing as evidenced by estimated needs.  GOAL:   Patient will meet greater than or equal to 90% of their needs  MONITOR:   PO intake, Supplement acceptance, Labs, Weight trends, I & O's, Skin  REASON FOR ASSESSMENT:   Malnutrition Screening Tool    ASSESSMENT:   62 year old right-handed male with history of hyperlipidemia, 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 Pt with generalized decline with unstable gait noted bouts of aphasia.  Pt readmitted 08/26/2018 for shunt revision ventricular peritoneal. In CIR, pt with worsening level of consciousness and enlarging right lateral ventricle. Patient presents now for shunt revision.  Pt transferred to acute care.   PROCEDURE: (10/21): SHUNT REVISION VENTRICULAR-PERITONEAL (Left)   Meal completion has been 100%. Wife at bedside reports pt has been eating well while in CIR with most to all meal completion 100%. Usual body weight reported to be ~191-195 lbs which pt last weighed one month ago. Pt with a 6.5% weight loss in 1 month (significant for time frame). Pt currently has Ensure ordered and has been consuming them. RD to continue with current orders to aid in caloric and protein needs. Wife has been encouraging increased protein intake for healing. Labs and medications reviewed.   NUTRITION - FOCUSED PHYSICAL EXAM:    Most Recent Value  Orbital Region  Unable to assess  Upper Arm Region  Mild depletion  Thoracic and Lumbar Region  No depletion  Buccal Region  No depletion  Temple Region  No depletion  Clavicle Bone Region  No depletion  Clavicle and Acromion Bone Region  No depletion  Scapular Bone  Region  No depletion  Dorsal Hand  No depletion  Patellar Region  No depletion  Anterior Thigh Region  No depletion  Posterior Calf Region  Unable to assess  Edema (RD Assessment)  None  Hair  Reviewed  Eyes  Reviewed  Mouth  Reviewed  Skin  Reviewed  Nails  Reviewed       Diet Order:   Diet Order            Diet Carb Modified Fluid consistency: Thin; Room service appropriate? Yes  Diet effective now              EDUCATION NEEDS:   Not appropriate for education at this time  Skin:  Skin Assessment: Skin Integrity Issues: Skin Integrity Issues:: Incisions Incisions: head  Last BM:  10/21  Height:   Ht Readings from Last 1 Encounters:  08/30/18 5\' 11"  (1.803 m)    Weight:   Wt Readings from Last 1 Encounters:  09/03/18 80.6 kg    Ideal Body Weight:  78 kg  BMI:  Body mass index is 24.78 kg/m.  Estimated Nutritional Needs:   Kcal:  2100-2300  Protein:  110-125 grams  Fluid:  >/= 2.1 L/day    Roslyn Smiling, MS, RD, LDN Pager # [phone] After hours/ weekend pager # [phone]

## 2018-09-03 NOTE — Discharge Summary (Deleted)
  The note originally documented on this encounter has been moved the the encounter in which it belongs.  

## 2018-09-03 NOTE — Discharge Summary (Signed)
Charles Marquez, Charles Marquez MEDICAL RECORD ZO:[id number] ACCOUNT [id number] DATE OF BIRTH:16-Mar-1956 FACILITY: [facility] LOCATION: [id number] PHYSICIAN:ANKIT PATEL, MD  DISCHARGE SUMMARY  DATE OF DISCHARGE:  09/02/2018  DISCHARGE DIAGNOSES: 1.  Unsteady gait with aphasia with ventriculoperitoneal shunt malfunction. 2.  Subcutaneous heparin for deep venous thrombosis prophylaxis. 3.  Pain management. 4.  Constipation. 5.  Acute blood loss anemia. 6.  Hyponatremia.  HOSPITAL COURSE:  A 62 year old right-handed male with history of hyperlipidemia, CVA with loop recorder insertion 04/10/2017 as well as right occipital VP shunt placement in 2016.  Lives with spouse.  Ambulating without assistive device  after most  recent discharge.  The patient with recent admission 08/04/2018 for unsteady gait, nausea, and vomiting.  Findings of malfunction of the VP shunt with revision 08/05/2018 discharge to home and readmitted 08/08/2018 to 08/22/2018 for VP shunt infection,  organism isolated Klebsiella.  He underwent removal of VP shunt with placement of external ventriculostomy.  Completed a 2-week course of IV antibiotics.  Followup scan showing right hydrocephalus.  Per report mildly enlarged right lateral ventricle, but  left ventricle and third ventricle and fourth ventricle were small.  Noted generalized decline, unsteady gait, aphasia.  He was again readmitted 08/26/2018 for shunt revision, ventriculoperitoneal 08/26/2018 per Dr. Jordan Likes.  Subcutaneous heparin added for  DVT prophylaxis.  Therapies completed and the patient was admitted for comprehensive rehabilitation program.  PAST MEDICAL HISTORY:  See discharge diagnoses.  SOCIAL HISTORY:  Lives with spouse.  FUNCTIONAL STATUS:  Upon admission to rehab services was moderate assist 10 feet rolling walker +2 physical assist sit to stand, min mod assist activities of daily living.  PHYSICAL EXAMINATION: VITAL SIGNS:  Blood pressure 139/77, pulse 47,  temperature 97, respirations 18.  Upon admission to rehab services. GENERAL:  Alert, spontaneous speech with some delayed processing.  Followed commands. HEENT:  EOMs intact. NECK:  Supple, nontender, no JVD. CARDIOVASCULAR:  Rate controlled. ABDOMEN:  Soft, nontender, good bowel sounds. LUNGS:  Clear to auscultation without wheeze.  REHABILITATION HOSPITAL COURSE:  Pertaining to patient's VP shunt malfunction, recent infection followed closely by neurosurgery.  Recent reprogramming of VP shunt 09/09/2018 per neurosurgery.  CT of the head 09/01/2018 showed stable appearance of  ventriculomegaly with unchanged positioning of ventriculostomy catheter.  He was attending therapies generalized decline bits of somnolence neurosurgery again notified for followup.  Anticipation for repeat CT scan on 09/03/2018.  However, on the evening  of 09/02/2018, patient more somnolent again.  Again neurosurgery consulted.  Suspect VP shunt malfunction.  He was discharged to acute care services of neurosurgery.  Underwent left proximal VP shunt revision, 09/02/2018 per Dr. Jordan Likes.  The patient would remain on acute care services under the discretion of neurosurgery and all medication changes made at the discretion of neurosurgery.    The patient's functional status while on rehab services and was ambulating 50 feet rolling walker, min mod assist.  Needed some cues.  Sit to stand required cues due to apraxia.  He was able to increase his gait to 100 feet with min mod assist.    Again, as stated, he was discharged to acute care services for VP shunt malfunction.  AN/NUANCE D:09/03/2018 T:09/03/2018 JOB:[id number]

## 2018-09-03 NOTE — Progress Notes (Signed)
Providing Compassionate, Quality Care - Together   Subjective: Wife reports Charles Marquez is doing better than yesterday. He was able to get up to the bedside commode with one assist. She reports that he is still confused and having some difficulty with word finding.  Objective: Vital signs in last 24 hours: Temp:  [97.5 F (36.4 C)-99.1 F (37.3 C)] 99 F (37.2 C) (10/22 1142) Pulse Rate:  [57-110] 110 (10/22 1142) Resp:  [9-20] 13 (10/22 1142) BP: (114-152)/(76-101) 114/76 (10/22 1142) SpO2:  [96 %-100 %] 97 % (10/22 1142) Weight:  [80.6 kg-80.7 kg] 80.6 kg (10/22 0100)  Intake/Output from previous day: 10/21 0701 - 10/22 0700 In: 1509.5 [I.V.:1159.5; IV Piggyback:350] Out: 1025 [Urine:1000; Blood:25] Intake/Output this shift: No intake/output data recorded.  Neurologic: Mental status: alertness: alert, orientation: person Motor: Improved: generalized  RUE 4+/5, LUE 5/5, RLE 4+/5, LLE 4+/5 Coordination: Able to hold fork and direct it to his mouth. Unable to follow command to touch finger to nose. Exhibiting apraxia  Lab Results: Recent Labs    09/02/18 0624 09/03/18 0441  WBC 5.5 6.3  HGB 12.1* 12.5*  HCT 36.2* 38.4*  PLT 176 150   BMET Recent Labs    09/01/18 1057 09/03/18 0441  NA 132* 133*  K 3.9 4.1  CL 95* 95*  CO2 31 30  GLUCOSE 98 118*  BUN 10 8  CREATININE 0.79 0.94  CALCIUM 8.5* 8.8*    Studies/Results: Dg Skull 1-3 Views  Result Date: 09/01/2018 CLINICAL DATA:  Obstructive hydrocephalus EXAM: SKULL - 1-3 VIEW COMPARISON:  Head CT from earlier the same day FINDINGS: Right parietal shunt with continuous tubing where radiodense. There is mild tube narrowing between the reservoir and valve due to angle. No significant osseous finding. IMPRESSION: Right parietal VP shunt. The tubing between the reservoir and valve is mildly narrowed due to curvature. Electronically Signed   By: Marnee Spring M.D.   On: 09/01/2018 19:09   Dg Chest 1 View  Result  Date: 09/01/2018 CLINICAL DATA:  Shunt series. EXAM: CHEST  1 VIEW COMPARISON:  08/08/2018 FINDINGS: Left chest wall shunt catheter noted which appears intact. Loop recorder device in the left chest wall. Heart is mildly enlarged. No confluent airspace opacities, effusions or edema. No acute bony abnormality. IMPRESSION: Mild cardiomegaly.  No active disease. Left chest wall shunt catheter appears intact. Electronically Signed   By: Charlett Nose M.D.   On: 09/01/2018 19:13   Ct Head Wo Contrast  Result Date: 09/03/2018 CLINICAL DATA:  Hydrocephalus. VP shunt revision yesterday. EXAM: CT HEAD WITHOUT CONTRAST TECHNIQUE: Contiguous axial images were obtained from the base of the skull through the vertex without intravenous contrast. COMPARISON:  09/01/2018 FINDINGS: Brain: A left parietal approach ventriculostomy catheter courses through the left lateral ventricle, crosses midline, and terminates in the body of the right lateral ventricle as before. There is a new small amount of gas and blood in the lateral ventricles compatible with interval shunt revision. The ventricles have mildly decreased in size. Mild edema is noted in the left parietal lobe surrounding the catheter, similar to prior. Hypoattenuation again extends into the deep white matter tracts at the level of the left basal ganglia. Periventricular white matter hypoattenuation is similar to the prior study and may reflect persistent mild transependymal CSF flow. Right parietal lobe calcification is again noted deep to a burr hole. There is no evidence of acute large territory infarct, midline shift, or extra-axial fluid collection. Vascular: Mild calcified atherosclerosis at the  skull base. No hyperdense vessel. Skull: Right frontal and right parietal burr holes. Sinuses/Orbits: Visualized paranasal sinuses and mastoid air cells are clear. Orbits are unremarkable. Other: None. IMPRESSION: Interval VP shunt revision with new small volume  intraventricular blood and gas and mildly decreased ventriculomegaly. Electronically Signed   By: Sebastian Ache M.D.   On: 09/03/2018 11:21   Dg Abd 2 Views  Result Date: 09/01/2018 CLINICAL DATA:  Obstructive hydrocephalus. EXAM: ABDOMEN - 2 VIEW COMPARISON:  Radiographs of August 04, 2018. FINDINGS: The bowel gas pattern is normal. There is no evidence of free air. Visualized portion of ventriculoperitoneal shunt appears intact with tip in left lower quadrant. No radio-opaque calculi or other significant radiographic abnormality is seen. IMPRESSION: No evidence of bowel obstruction or ileus. Visualized portion of ventriculoperitoneal shunt appears intact. Electronically Signed   By: Lupita Raider, M.D.   On: 09/01/2018 19:16    Assessment/Plan: 62 year old male with complicated shunt history presented with worsening level of consciousness and enlarged right lateral ventricle.  Patient is s/p left proximal shunt revision on 09/02/2018. Charles Marquez mental status is improved following shunt revision. He is awake, alert, and able to feed himself this morning. His balance and coordination are much improved from last night. He is experiencing some expressive aphasia and apraxia.   LOS: 1 day    Charles Marquez will remain on 4NP for monitoring of his neurologic status following shunt revision. Will continue Vancomycin for three days as surgical prophylaxis. Magic mouthwash ordered per wife's request. PO dulcolax ordered per RN's request. Therapies have been ordered.   Val Eagle, DNP, AGNP-C Nurse Practitioner  [facility] [address], [address], [place], [place] [address] P: [phone]    F: [phone]  09/03/2018, 12:02 PM

## 2018-09-03 NOTE — Progress Notes (Signed)
Ct called to inform of order.  Will call when they can take him.

## 2018-09-03 NOTE — Discharge Summary (Signed)
Discharge summary job [phone]

## 2018-09-04 LAB — PATHOLOGIST SMEAR REVIEW

## 2018-09-04 NOTE — Progress Notes (Signed)
Looks better today.  Patient bright and awake.  Appears aware.  Speech minimal with some continued dysarthria.  Patient will answer some simple questions.  He will follow commands.  His strength still shows some mild right-sided weakness but overall is reasonably symmetric.  His neck is supple.  Chest and abdomen are benign.  He remains afebrile.  His shunt pumps and refills easily.  Overall progressing well.  We will mobilize more today.  Continue rehab efforts.  Follow-up CT scan tomorrow.

## 2018-09-04 NOTE — Progress Notes (Signed)
Inpatient Rehabilitation Admissions Coordinator  I will follow pt's progress with therapy as we await medical readiness to determine rehab venue needs when pt medically ready. I will follow up with pt and wife tomorrow.  Ottie Glazier, RN, MSN Rehab Admissions Coordinator [phone] 09/04/2018 1:05 PM

## 2018-09-04 NOTE — Evaluation (Signed)
Speech Language Pathology Evaluation Patient Details Name: Charles Marquez MRN: [id number] DOB: 02-15-1956 Today's Date: 09/04/2018 Time: 4098-1191 SLP Time Calculation (min) (ACUTE ONLY): 11 min  Problem List:  Patient Active Problem List   Diagnosis Date Noted  . Acute blood loss anemia   . S/P VP shunt   . Hydrocephalus (HCC) 08/30/2018  . Dyslipidemia   . History of CVA (cerebrovascular accident)   . Benign essential HTN   . Tachycardia   . Leukocytosis   . Hyponatremia   . Hypokalemia   . 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 Bacterial meningitis 08/09/2018  . TIA (transient ischemic attack) 01/29/2017  . Acute encephalopathy   . Shunt malfunction 03/13/2016  . 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 Hyperlipidemia 10/20/2014  . Degenerative disc disease, lumbar 04/15/2013  . Routine general medical examination at a health care facility 07/23/2012  . DISTURBANCE OF SKIN SENSATION 10/05/2008  . HYPERLIPIDEMIA 01/01/2008  . MYCOPLASMA PNEUMONIA 01/01/2008   Past Medical History:  Past Medical History:  Diagnosis Date  . Anxiety   . Hypercholesteremia   . Stroke (HCC)    tia's  . TIA (transient ischemic attack)    09.15   Past Surgical History:  Past Surgical History:  Procedure Laterality Date  . Fractured arm Left 12  . HERNIA REPAIR Right 3/12  . LAPAROSCOPIC REVISION VENTRICULAR-PERITONEAL (V-P) SHUNT N/A 08/26/2018   Procedure: LAPAROSCOPIC INSERTION VENTRICULAR-PERITONEAL (V-P) SHUNT;  Surgeon: Julio Sicks, MD;  Location: [facility] OR;  Service: Neurosurgery;  Laterality: N/A;  . LOOP RECORDER INSERTION N/A 04/10/2017   Procedure: Loop Recorder Insertion;  Surgeon: Hillis Range, MD;  Location: [facility] INVASIVE CV LAB;  Service: Cardiovascular;  Laterality: N/A;  . SHUNT REMOVAL Right 03/13/2016   Procedure: SHUNT REMOVAL;  Surgeon: Julio Sicks, MD;   Location: [facility] NEURO ORS;  Service: Neurosurgery;  Laterality: Right;  . SHUNT REMOVAL Right 08/09/2018   Procedure: SHUNT REMOVAL With Placement of Ventricular Catheter;  Surgeon: Lisbeth Renshaw, MD;  Location: [facility] OR;  Service: Neurosurgery;  Laterality: Right;  . SHUNT REVISION Right 08/05/2018   Procedure: SHUNT REVISION;  Surgeon: Julio Sicks, MD;  Location: [facility] OR;  Service: Neurosurgery;  Laterality: Right;  . SHUNT REVISION VENTRICULAR-PERITONEAL Left 08/26/2018   Procedure: SHUNT REVISION VENTRICULAR-PERITONEAL;  Surgeon: Julio Sicks, MD;  Location: [facility] OR;  Service: Neurosurgery;  Laterality: Left;  . SHUNT REVISION VENTRICULAR-PERITONEAL Left 09/02/2018   Procedure: Left Occipital VP shunt revision;  Surgeon: Julio Sicks, MD;  Location: [facility] OR;  Service: Neurosurgery;  Laterality: Left;  [name] VASECTOMY  10/02/1997  . 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 VENTRICULOPERITONEAL SHUNT Right 07/22/2018   Procedure: Shunt Placment right occipital;  Surgeon: Julio Sicks, MD;  Location: [facility] OR;  Service: Neurosurgery;  Laterality: Right;  . VENTRICULOPERITONEAL SHUNT Left 08/26/2018   Procedure: LEFT SIDED VENTRICULAR-PERITONEAL SHUNT;  Surgeon: Julio Sicks, MD;  Location: [facility] OR;  Service: Neurosurgery;  Laterality: Left;  [name] VENTRICULOSTOMY Right 08/09/2018   Procedure: VENTRICULOSTOMY;  Surgeon: Lisbeth Renshaw, MD;  Location: [facility] OR;  Service: Neurosurgery;  Laterality: Right;   HPI:  Pt is a 62 y/o male admitted from CIR secondary to decline in functional mobility and decreased arousal. Thought to be  secondary to VP shunt malfunction. Pt is s/p shunt revision on 10/21. Recent history of VP shunt placement and revision. PMH includes anxiety, TIA, and LUE fx.    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 During this evaluation, pt was nonverbal but answered yes/no question with head nod regarding himself. His wife was present and states that his spontaneous speech is much better than when he discharged from CIR but he doesn't initiate verbal communication or any form of communication (gestures/yes/no) to indicate wants and needs. Pt was not able to follow any 1 step directions during this session. Pt continues to require skilled ST to target functional communication. ST to follow.     SLP Assessment  SLP Recommendation/Assessment: Patient needs continued Speech Lanaguage Pathology Services SLP Visit Diagnosis: Aphasia (R47.01)    Follow Up Recommendations  Inpatient Rehab    Frequency and Duration min 2x/week  2 weeks      SLP Evaluation Cognition  Overall Cognitive Status: Impaired/Different from baseline Arousal/Alertness: Awake/alert Orientation Level: Oriented to person Attention: Sustained Sustained Attention: Impaired Sustained Attention Impairment: Functional basic;Verbal basic Awareness: Impaired Awareness Impairment: Intellectual impairment Problem Solving: Impaired Problem Solving Impairment: Verbal basic;Functional basic Executive Function: (all areas impacted by lower level deficits) Safety/Judgment: Impaired Comments: decreased awareness of deficits and decreased safety awareness       Comprehension  Auditory Comprehension Overall Auditory Comprehension: Impaired Yes/No Questions: Impaired Basic Biographical Questions: 26-50% accurate Basic Immediate Environment Questions: 25-49% accurate Commands: Impaired One Step Basic Commands: 25-49% accurate Two Step Basic Commands: 0-24% accurate Visual Recognition/Discrimination Discrimination: Not tested Reading Comprehension Reading Status: Not tested    Expression Expression Primary Mode of Expression: Verbal Verbal  Expression Overall Verbal Expression: Impaired Initiation: Impaired Pragmatics: Impairment Impairments: Interpretation of nonverbal communication Interfering Components: Attention Non-Verbal Means of Communication: Gestures Written Expression Dominant Hand: Right Written Expression: Not tested   Oral / Motor  Oral Motor/Sensory Function Overall Oral Motor/Sensory Function: Within functional limits Motor Speech Overall Motor Speech: Appears within functional limits for tasks assessed Respiration: Within functional limits   GO                    Babs Dabbs 09/04/2018, 1:20 PM

## 2018-09-04 NOTE — Anesthesia Postprocedure Evaluation (Addendum)
Anesthesia Post Note  Patient: Charles Marquez  Procedure(s) Performed: Left Occipital VP shunt revision (Left Head)     Patient location during evaluation: PACU Anesthesia Type: General Level of consciousness: responds to stimulation and sedated Pain management: pain level controlled Vital Signs Assessment: post-procedure vital signs reviewed and stable Respiratory status: spontaneous breathing, nonlabored ventilation, respiratory function stable and patient connected to nasal cannula oxygen Cardiovascular status: blood pressure returned to baseline and stable Postop Assessment: no apparent nausea or vomiting Anesthetic complications: no    Last Vitals:  Vitals:   09/04/18 0903 09/04/18 1232  BP: 134/89 (!) 129/92  Pulse: 66 72  Resp: 15   Temp: 36.5 C 36.8 C  SpO2: 95% 97%    Last Pain:  Vitals:   09/04/18 1232  TempSrc: Oral  PainSc:                  Kennieth Rad

## 2018-09-04 NOTE — Plan of Care (Signed)

## 2018-09-04 NOTE — Evaluation (Signed)
Physical Therapy Evaluation Patient Details Name: Charles Marquez MRN: [id number] DOB: [birth year] Today's Date: 09/04/2018   History of Present Illness  Pt is a 62 y/o male admitted from CIR secondary to decline in functional mobility and decreased arousal. Thought to be secondary to VP shunt malfunction. Pt is s/p shunt revision on 10/21. Recent history of VP shunt placement and revision. PMH includes anciety, TIA, and LUE fx.   Clinical Impression  Pt admitted secondary to problem above with deficits below. Pt presenting with R sided weakness, inattention vs neglect on the R, global aphasia, and difficulty sequencing. Pt requiring min to mod A +2 for mobility this session. [name] and daughter report improvement in mobility from over weekend. Feel pt would benefit from continued therapy at CIR level to increase independence and safety with functional mobility. Will continue to follow acutely to maximize functional mobility independence and safety.     Follow Up Recommendations Supervision/Assistance - 24 hour;CIR    Equipment Recommendations  None recommended by PT    Recommendations for Other Services Rehab consult     Precautions / Restrictions Precautions Precautions: Fall Precaution Comments: right lateral lean, R LE weakness, R inattention Restrictions Weight Bearing Restrictions: No      Mobility  Bed Mobility Overal bed mobility: Needs Assistance Bed Mobility: Supine to Sit     Supine to sit: Mod assist     General bed mobility comments: Mod A for trunk elevation and LE assist. Pt not following verbal cues for sequencing, likely secondary to aphasia, so gave manual cues insteady.   Transfers Overall transfer level: Needs assistance Equipment used: 1 person hand held assist Transfers: Sit to/from Stand Sit to Stand: Mod assist         General transfer comment: Mod A for lift assist and steadying. Pt with posterior lean and required manual assist to  correct.   Ambulation/Gait Ambulation/Gait assistance: Min assist;Mod assist;+2 physical assistance Gait Distance (Feet): 15 Feet Assistive device: 2 person hand held assist Gait Pattern/deviations: Step-through pattern;Narrow base of support;Decreased weight shift to right Gait velocity: decreased   General Gait Details: Pt holding onto PT hand and daughter's hand during mobility. Unsteady during gait and running into obstacles on the R side. Required manual assist for directional cues and sequencing. Noted decreased coordination with RLE.   Stairs            Wheelchair Mobility    Modified Rankin (Stroke Patients Only)       Balance Overall balance assessment: Needs assistance Sitting-balance support: Feet supported;No upper extremity supported Sitting balance-Leahy Scale: Fair   Postural control: Posterior lean Standing balance support: Bilateral upper extremity supported;During functional activity Standing balance-Leahy Scale: Poor Standing balance comment: External assist and UE support required.                              Pertinent Vitals/Pain Pain Assessment: Faces Faces Pain Scale: No hurt    Home Living Family/patient expects to be discharged to:: Inpatient rehab                      Prior Function Level of Independence: Needs assistance   Gait / Transfers Assistance Needed: Per wife, was working with therapy services on ambulation with and without AD.   ADL's / Homemaking Assistance Needed: Required assist for ADLs in CIR.         Hand Dominance   Dominant Hand: Right  Extremity/Trunk Assessment   Upper Extremity Assessment Upper Extremity Assessment: Defer to OT evaluation;RUE deficits/detail RUE Deficits / Details: Pt tending to undershoot target when performing finger to nose test. Seemed to have functional inattention vs. neglect.  RUE Coordination: decreased fine motor;decreased gross motor    Lower Extremity  Assessment Lower Extremity Assessment: RLE deficits/detail RLE Deficits / Details: RLE weakness noted functionally and noted decreased coordination functionall.  RLE Coordination: decreased gross motor;decreased fine motor    Cervical / Trunk Assessment Cervical / Trunk Assessment: Other exceptions Cervical / Trunk Exceptions: posterior lean upon standing   Communication   Communication: Expressive difficulties;Receptive difficulties  Cognition Arousal/Alertness: Awake/alert Behavior During Therapy: WFL for tasks assessed/performed Overall Cognitive Status: Impaired/Different from baseline Area of Impairment: Attention;Memory;Following commands;Safety/judgement;Awareness;Problem solving                   Current Attention Level: Sustained Memory: Decreased recall of precautions;Decreased short-term memory Following Commands: Follows one step commands with increased time Safety/Judgement: Decreased awareness of safety;Decreased awareness of deficits Awareness: Intellectual Problem Solving: Difficulty sequencing;Requires verbal cues;Requires tactile cues;Slow processing;Decreased initiation General Comments: Pt with global aphasia, and unable to state his name and birthday, however, when asked if he prefers to go by "Ed" he stated yes. Required manual/verbal cues for sequencing throughout.       General Comments General comments (skin integrity, edema, etc.): [name] and daughter present during session. [name] reports fluctuating double vision    Exercises     Assessment/Plan    PT Assessment Patient needs continued PT services  PT Problem List Decreased mobility;Decreased safety awareness;Decreased coordination;Decreased balance;Decreased cognition;Decreased strength;Decreased knowledge of precautions;Decreased knowledge of use of DME       PT Treatment Interventions DME instruction;Gait training;Stair training;Functional mobility training;Therapeutic  activities;Therapeutic exercise;Balance training;Neuromuscular re-education;Cognitive remediation;Patient/family education    PT Goals (Current goals can be found in the Care Plan section)  Acute Rehab PT Goals Patient Stated Goal: to stop having declines in health per wife and daughter PT Goal Formulation: With patient/family Time For Goal Achievement: 09/18/18 Potential to Achieve Goals: Good    Frequency Min 3X/week   Barriers to discharge        Co-evaluation               AM-PAC PT "6 Clicks" Daily Activity  Outcome Measure Difficulty turning over in bed (including adjusting bedclothes, sheets and blankets)?: A Lot Difficulty moving from lying on back to sitting on the side of the bed? : Unable Difficulty sitting down on and standing up from a chair with arms (e.g., wheelchair, bedside commode, etc,.)?: Unable Help needed moving to and from a bed to chair (including a wheelchair)?: A Lot Help needed walking in hospital room?: A Lot Help needed climbing 3-5 steps with a railing? : A Lot 6 Click Score: 10    End of Session Equipment Utilized During Treatment: Gait belt Activity Tolerance: Patient tolerated treatment well Patient left: in chair;with call bell/phone within reach;with chair alarm set;with family/visitor present Nurse Communication: Mobility status PT Visit Diagnosis: Unsteadiness on feet (R26.81);Difficulty in walking, not elsewhere classified (R26.2);Other symptoms and signs involving the nervous system (R29.898)    Time: 1043-1100 PT Time Calculation (min) (ACUTE ONLY): 17 min   Charges:   PT Evaluation $PT Eval Moderate Complexity: 1 Mod          Gladys Damme, PT, DPT  Acute Rehabilitation Services  Pager: [phone] Office: [phone]   Lehman Prom 09/04/2018, 11:38 AM

## 2018-09-04 NOTE — Progress Notes (Signed)
Occupational Therapy Evaluation Patient Details Name: Charles Marquez MRN: [id number] DOB: 1956-04-07 Today's Date: 09/04/2018    History of Present Illness A 62 year old right-handed male with history of hyperlipidemia, CVA with loop recorder insertion 04/10/2017 as well as right occipital VP shunt placement in 2016.  Lives with spouse.  The patient with recent admission 08/04/2018 for unsteady gait, nausea, and vomiting.  Findings of malfunction of the VP shunt with revision 08/05/2018 discharge to home and readmitted 08/08/2018 to 08/22/2018 for VP shunt infection, Underwent removal of VP shunt with placement of external ventriculostomy. Coompleted 2 week antibiotic then DC home. Readmitted 08/26/18 to hospital due to decline and unsteady gait and aphasia. Underwent shunt revision and ventriculoperitoneal. DC to CIR for rehab. REadmitt4ed to acute care form CIR 09/02/2018 due to decline. Patient is s/p left proximal shunt revision on 09/02/2018.    Clinical Impression   Familiar with this pt from last acute admission. Pt demonstrates significant deficits as listed below and will benefit from continued intensive rehab at [facility] when medically appropriate. Pt requires Mod A with mobility and Max A with ADL. Pt unable to feed himself due to apparent apraxia and will require assistance by staff if family not available. Began education regarding apparent motor planning deficits and  self feeding with family. Will follow acutely.     Follow Up Recommendations  Supervision/Assistance - 24 hour;CIR    Equipment Recommendations  3 in 1 bedside commode    Recommendations for Other Services Rehab consult     Precautions / Restrictions Precautions Precautions: Fall Precaution Comments: right lateral lean, R LE weakness, R inattention Restrictions Weight Bearing Restrictions: No      Mobility Bed Mobility   General bed mobility comments: OOB in chair  Transfers Overall transfer level: Needs  assistance Equipment used: 1 person hand held assist Transfers: Sit to/from Stand;Stand Pivot Transfers Sit to Stand: Mod assist Stand pivot transfers: Mod assist       General transfer comment: Mod A for lift assist and steadying. Pt with posterior lean and required manual assist to correct.     Balance Overall balance assessment: Needs assistance Sitting-balance support: Feet supported;No upper extremity supported Sitting balance-Leahy Scale: Fair   Postural control: Posterior lean Standing balance support: Bilateral upper extremity supported;During functional activity Standing balance-Leahy Scale: Poor Standing balance comment: External assist and UE support required.                            ADL either performed or assessed with clinical judgement   ADL Overall ADL's : Needs assistance/impaired Eating/Feeding: Moderate assistance Eating/Feeding Details (indicate cue type and reason): Pt poured mild on tray; trying to eat without food on utensil; picked up entire plate as if to lick the plate Grooming: Maximal assistance Grooming Details (indicate cue type and reason): pt unable to initiate brushing teeth; max cues to sequence; max cues to attend to task Upper Body Bathing: Maximal assistance;Sitting   Lower Body Bathing: Maximal assistance;Sit to/from stand   Upper Body Dressing : Maximal assistance;Sitting   Lower Body Dressing: Maximal assistance;Sit to/from stand   Toilet Transfer: Moderate assistance   Toileting- Clothing Manipulation and Hygiene: Maximal assistance Toileting - Clothing Manipulation Details (indicate cue type and reason): does not appear to assiciate sittin gon toilet with trying to use the bathroom; pt appeared to be perseverating on brushing teeth     Functional mobility during ADLs: Moderate assistance General ADL Comments: Educated fmaily on  reducing distraction sna simplifying tray during meals. Educated on use of hand over hand  technique to help with self feeding     Vision Baseline Vision/History: Wears glasses Wears Glasses: At all times Vision Assessment?: Vision impaired- to be further tested in functional context Eye Alignment: Within Functional Limits Ocular Range of Motion: Within Functional Limits Alignment/Gaze Preference: Within Defined Limits Tracking/Visual Pursuits: Decreased smoothness of horizontal tracking;Decreased smoothness of vertical tracking Saccades: Decreased speed of saccadic movement;Impaired - to be further tested in functional context;Additional head turns occurred during testing Additional Comments: unsure if pt is experiencing diplopia. note overshooting     Perception Perception Comments: Decreased R attention; poor spatial awareness   Praxis Praxis Praxis tested?: Deficits Deficits: Perseveration;Motor Impersistence;Organization;Ideomotor(will further assess)    Pertinent Vitals/Pain Pain Assessment: Faces Faces Pain Scale: No hurt     Hand Dominance Right   Extremity/Trunk Assessment Upper Extremity Assessment Upper Extremity Assessment: RUE deficits/detail RUE Deficits / Details: RUE generalized weakness, able to use functionally to brush teeth; note poor coordination RUE Coordination: decreased fine motor;decreased gross motor   Lower Extremity Assessment Lower Extremity Assessment: Defer to PT evaluation RLE Deficits / Details: RLE weakness noted functionally and noted decreased coordination functionall.  RLE Coordination: decreased gross motor;decreased fine motor   Cervical / Trunk Assessment Cervical / Trunk Assessment: Other exceptions(R bias) Cervical / Trunk Exceptions: posterior lean upon standing    Communication Communication Communication: Receptive difficulties;Expressive difficulties   Cognition Arousal/Alertness: Awake/alert Behavior During Therapy: Impulsive;Flat affect Overall Cognitive Status: Impaired/Different from baseline Area of  Impairment: Orientation;Attention;Memory;Following commands;Safety/judgement;Awareness;Problem solving                 Orientation Level: Disoriented to;Time;Place;Situation(difficulty with language) Current Attention Level: Sustained Memory: Decreased recall of precautions;Decreased short-term memory Following Commands: Follows one step commands inconsistently Safety/Judgement: Decreased awareness of safety;Decreased awareness of deficits Awareness: Intellectual Problem Solving: Slow processing;Decreased initiation;Difficulty sequencing;Requires verbal cues;Requires tactile cues General Comments: Pt with global aphasia, and unable to state his name and birthday, however, when asked if he prefers to go by "Ed" he stated yes. Required manual/verbal cues for sequencing throughout.    General Comments  [name] and daughter present during session. [name] reports fluctuating double vision    Exercises     Shoulder Instructions      Home Living Family/patient expects to be discharged to:: Inpatient rehab                                        Prior Functioning/Environment Level of Independence: Needs assistance  Gait / Transfers Assistance Needed: Per wife, was working with therapy services on ambulation with and without AD.  ADL's / Homemaking Assistance Needed: Required assist for ADLs in CIR.  Communication / Swallowing Assistance Needed: receptive and expressive communication deficits          OT Problem List: Decreased strength;Decreased range of motion;Decreased activity tolerance;Decreased coordination;Impaired vision/perception;Impaired balance (sitting and/or standing);Decreased cognition;Decreased safety awareness;Decreased knowledge of use of DME or AE;Decreased knowledge of precautions;Impaired sensation;Impaired UE functional use      OT Treatment/Interventions: Self-care/ADL training;Neuromuscular education;DME and/or AE instruction;Therapeutic  activities;Cognitive remediation/compensation;Visual/perceptual remediation/compensation;Patient/family education;Balance training    OT Goals(Current goals can be found in the care plan section) Acute Rehab OT Goals Patient Stated Goal: to stop having declines in health per wife and daughter OT Goal Formulation: With patient/family Time For Goal Achievement: 09/18/18 Potential to Achieve  Goals: Good  OT Frequency: Min 2X/week   Barriers to D/C:            Co-evaluation              AM-PAC PT "6 Clicks" Daily Activity     Outcome Measure Help from another person eating meals?: A Lot Help from another person taking care of personal grooming?: A Lot Help from another person toileting, which includes using toliet, bedpan, or urinal?: A Lot Help from another person bathing (including washing, rinsing, drying)?: A Lot Help from another person to put on and taking off regular upper body clothing?: A Lot Help from another person to put on and taking off regular lower body clothing?: A Lot 6 Click Score: 12   End of Session Equipment Utilized During Treatment: Gait belt;Rolling walker Nurse Communication: Mobility status  Activity Tolerance: Patient tolerated treatment well Patient left: in chair;with call bell/phone within reach;with chair alarm set;with family/visitor present  OT Visit Diagnosis: Other abnormalities of gait and mobility (R26.89);Muscle weakness (generalized) (M62.81);Low vision, both eyes (H54.2);Apraxia (R48.2);Other symptoms and signs involving cognitive function;Hemiplegia and hemiparesis Hemiplegia - Right/Left: Right Hemiplegia - dominant/non-dominant: Dominant Hemiplegia - caused by: Unspecified                Time: 0272-5366 OT Time Calculation (min): 35 min Charges:  OT General Charges $OT Visit: 1 Visit OT Evaluation $OT Eval Moderate Complexity: 1 Mod OT Treatments $Self Care/Home Management : 8-22 mins  Luisa Dago, OT/L   Acute OT Clinical  Specialist Acute Rehabilitation Services Pager [phone] Office [phone]   [facility] 09/04/2018, 12:28 PM

## 2018-09-05 ENCOUNTER — Inpatient Hospital Stay (HOSPITAL_COMMUNITY): Payer: BLUE CROSS/BLUE SHIELD

## 2018-09-05 ENCOUNTER — Telehealth: Payer: Self-pay

## 2018-09-05 MED ORDER — FAMOTIDINE 20 MG PO TABS
20.0000 mg | ORAL_TABLET | Freq: Two times a day (BID) | ORAL | Status: DC
  Administered 2018-09-05 – 2018-09-10 (×10): 20 mg via ORAL
  Filled 2018-09-05 (×10): qty 1

## 2018-09-05 NOTE — Progress Notes (Signed)
Physical Therapy Treatment Patient Details Name: Charles Marquez MRN: [id number] DOB: 1956/07/26 Today's Date: 09/05/2018    History of Present Illness A 62 year old right-handed male with history of hyperlipidemia, CVA with loop recorder insertion 04/10/2017 as well as right occipital VP shunt placement in 2016.  Lives with spouse.  The patient with recent admission 08/04/2018 for unsteady gait, nausea, and vomiting.  Findings of malfunction of the VP shunt with revision 08/05/2018 discharge to home and readmitted 08/08/2018 to 08/22/2018 for VP shunt infection, Underwent removal of VP shunt with placement of external ventriculostomy. Coompleted 2 week antibiotic then DC home. Readmitted 08/26/18 to hospital due to decline and unsteady gait and aphasia. Underwent shunt revision and ventriculoperitoneal. DC to CIR for rehab. REadmitt4ed to acute care form CIR 09/02/2018 due to decline. Patient is s/p left proximal shunt revision on 09/02/2018.     PT Comments    Notable functional improvements over last session.  Still some processing and awareness issues in addition to balance/stability issues    Follow Up Recommendations  Supervision/Assistance - 24 hour;CIR     Equipment Recommendations  None recommended by PT    Recommendations for Other Services Rehab consult     Precautions / Restrictions Precautions Precautions: Fall    Mobility  Bed Mobility               General bed mobility comments: OOB in chair  Transfers Overall transfer level: Needs assistance   Transfers: Sit to/from Stand Sit to Stand: Min assist         General transfer comment: stability assist.  pt needed 1 hand on armrest to stay in midline.  Ambulation/Gait Ambulation/Gait assistance: Min assist;+2 safety/equipment Gait Distance (Feet): 100 Feet(x3 with standing rests to recoop/regroup) Assistive device: 1 person hand held assist;2 person hand held assist Gait Pattern/deviations:  Step-through pattern Gait velocity: decreased,  mild to moderate deviation with change in speed. Gait velocity interpretation: <1.8 ft/sec, indicate of risk for recurrent falls General Gait Details: Challenged with aspects of DGI.  Pt frequently needed steadying assist with scanning, directional changes, sidestepping and backing up.   Stairs             Wheelchair Mobility    Modified Rankin (Stroke Patients Only)       Balance Overall balance assessment: Needs assistance   Sitting balance-Leahy Scale: Fair     Standing balance support: No upper extremity supported Standing balance-Leahy Scale: Fair Standing balance comment: guard  to min assist necessary for dynamic stability, guard for static                 Standardized Balance Assessment Standardized Balance Assessment : Berg Balance Test Berg Balance Test Sit to Stand: Able to stand  independently using hands Standing Unsupported: Able to stand 30 seconds unsupported Sitting with Back Unsupported but Feet Supported on Floor or Stool: Able to sit 2 minutes under supervision Stand to Sit: Controls descent by using hands Transfers: Able to transfer with verbal cueing and /or supervision From Standing Position, Pick up Object from Floor: Unable to pick up and needs supervision From Standing Position, Turn to Look Behind Over each Shoulder: Looks behind one side only/other side shows less weight shift Turn 360 Degrees: Able to turn 360 degrees safely but slowly Dynamic Gait Index Level Surface: Mild Impairment Change in Gait Speed: Moderate Impairment Gait with Horizontal Head Turns: Moderate Impairment Gait with Vertical Head Turns: Mild Impairment      Cognition Arousal/Alertness: Awake/alert Behavior During  Therapy: WFL for tasks assessed/performed Overall Cognitive Status: Impaired/Different from baseline                   Orientation Level: Situation Current Attention Level:  Sustained Memory: Decreased short-term memory Following Commands: Follows one step commands with increased time;Follows multi-step commands inconsistently Safety/Judgement: Decreased awareness of safety;Decreased awareness of deficits Awareness: Intellectual Problem Solving: Slow processing;Difficulty sequencing;Requires verbal cues;Requires tactile cues General Comments: Pt more able to attend the task, still looks to his wife for confirmation.  Does not remember sequences of events, mild apraxia      Exercises      General Comments General comments (skin integrity, edema, etc.): Cognitive issues are equally problematic.  Pt following simple instructions/ concept.  ie  Where is the room number?, What does it say? are okay, but  "Now where do we go to you room or Remember your room number I just told you? pt does not do well with, for example.      Pertinent Vitals/Pain Pain Assessment: No/denies pain    Home Living                      Prior Function            PT Goals (current goals can now be found in the care plan section) Acute Rehab PT Goals Patient Stated Goal: to stop having declines in health per wife and daughter PT Goal Formulation: With patient/family Time For Goal Achievement: 09/18/18 Potential to Achieve Goals: Good Progress towards PT goals: Progressing toward goals    Frequency    Min 3X/week      PT Plan Current plan remains appropriate    Co-evaluation              AM-PAC PT "6 Clicks" Daily Activity  Outcome Measure  Difficulty turning over in bed (including adjusting bedclothes, sheets and blankets)?: A Little Difficulty moving from lying on back to sitting on the side of the bed? : A Little Difficulty sitting down on and standing up from a chair with arms (e.g., wheelchair, bedside commode, etc,.)?: A Little Help needed moving to and from a bed to chair (including a wheelchair)?: A Little Help needed walking in hospital room?:  A Lot Help needed climbing 3-5 steps with a railing? : A Lot 6 Click Score: 16    End of Session   Activity Tolerance: Patient tolerated treatment well Patient left: in chair;with call bell/phone within reach;with chair alarm set;with family/visitor present Nurse Communication: Mobility status PT Visit Diagnosis: Unsteadiness on feet (R26.81);Difficulty in walking, not elsewhere classified (R26.2);Other symptoms and signs involving the nervous system (R29.898)     Time: 1436-1500 PT Time Calculation (min) (ACUTE ONLY): 24 min  Charges:  $Gait Training: 8-22 mins $Therapeutic Activity: 8-22 mins                     09/05/2018  Liberty Bing, PT Acute Rehabilitation Services [phone]  (pager) [phone]  (office)   Eliseo Gum Addalynne Golding 09/05/2018, 7:34 PM

## 2018-09-05 NOTE — Telephone Encounter (Signed)
LMOVM requesting that pt send manual transmission b/c home monitor has not updated in at least 14 days.    

## 2018-09-05 NOTE — Plan of Care (Signed)

## 2018-09-05 NOTE — H&P (Signed)
Interim note:  Patient admitted with progressive decreased level of consciousness.  No fever.  No other neurologic symptoms.  Head CT scan demonstrates probable VP shunt malfunction.  Patient admitted for VP shunt revision.

## 2018-09-05 NOTE — Progress Notes (Signed)
   Providing Compassionate, Quality Care - Together   Subjective: Charles Marquez reports feeling much improved from yesterday. His wife says he is much more alert and his apraxia seems to have resolved. She says he is still having difficulty with word finding.  Objective: Vital signs in last 24 hours: Temp:  [97.9 F (36.6 C)-99.1 F (37.3 C)] 98.2 F (36.8 C) (10/24 0855) Pulse Rate:  [66-79] 66 (10/24 0855) Resp:  [20] 20 (10/23 2011) BP: (129-140)/(82-99) 138/99 (10/24 0855) SpO2:  [94 %-97 %] 96 % (10/24 0855)  Intake/Output from previous day: 10/23 0701 - 10/24 0700 In: 240 [P.O.:240] Out: 1300 [Urine:1300] Intake/Output this shift: No intake/output data recorded.  Neurologic: Mental status: alertness: alert, orientation: person, place, and situation Motor: RUE and RLE strength have improved from previous assessment. Mr. Vignola is able to carry out instructions and is not exhibiting sign of apraxia on this assessment.  He still has moderate expressive aphasia.  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 COMPARISON:  CT HEAD September 03, 2018 and priors. FINDINGS: BRAIN: Ventriculoperitoneal shunt via LEFT parietal burr hole traversing the lateral ventricles with distal tip RIGHT corona radiata/basal ganglia. Nearly resolved hydrocephalus with small residual volume residual intraventricular hydrocephalus. Resolution of intraventricular hemorrhage. Decreasing interstitial edema along ventriculostomy  catheter. Small area RIGHT parietal encephalomalacia and mineralization. No intraparenchymal hemorrhage, mass effect or midline shift. No abnormal extra-axial fluid collections. Basal cisterns are patent. VASCULAR: Negative. SKULL: No skull fracture. RIGHT frontal and bilateral parietal burr holes. No significant scalp soft tissue swelling. SINUSES/ORBITS: Trace paranasal sinus mucosal thickening. Mastoid air cells are well aerated.The included ocular globes and orbital contents are non-suspicious. OTHER: None. IMPRESSION: 1. Nearly resolved hydrocephalus, stable appearance of ventriculoperitoneal shunt. 2. Small volume residual intraventricular pneumocephalus, resolved intraventricular hemorrhage. Electronically Signed   By: Awilda Metro M.D.   On: 09/05/2018 03:04    Assessment/Plan: 62 year old male with complicated shunt history presented with worsening level of consciousness and enlarged right lateral ventricle. Patient is s/p left proximal shunt revision on 09/02/2018. Mr. Lampe mental status is improved following shunt revision. He is awake, alert, and interacting with his wife. He is experiencing moderate expressive aphasia. His scan from today showed near resolution of his hydrocephalus and stable appearance of his VP shunt.   LOS: 3 days    Shunt pumped and refilled easily. Famotidine was switched from IV to PO. Continue therapies.Mr. Beverlin will remain on 4NP for monitoring of his neurologic status following shunt revision.  Val Eagle, DNP, AGNP-C Nurse Practitioner  [facility] [address], [address], [place], [place] [address] P: [phone]    F: [phone]  09/05/2018, 10:19 AM

## 2018-09-05 NOTE — Progress Notes (Signed)
Inpatient Rehabilitation Admissions Coordinator  I met with patient and wife at bedside just to touch base. Wife pleased with his current recovery. I will follow his case at a distance. Please call with any questions.  Danne Baxter, RN, MSN Rehab Admissions Coordinator [phone] 09/05/2018 11:01 AM

## 2018-09-06 LAB — CSF CULTURE: CULTURE: NO GROWTH

## 2018-09-06 LAB — CSF CULTURE W GRAM STAIN

## 2018-09-06 NOTE — Progress Notes (Signed)
Inpatient Rehabilitation Admissions Coordinator  Discussed with Dr. Jordan Likes . I will follow up on Monday for rehab venue options.  Ottie Glazier, RN, MSN Rehab Admissions Coordinator [phone] 09/06/2018 11:38 AM

## 2018-09-06 NOTE — Progress Notes (Signed)
Occupational Therapy Treatment Patient Details Name: Charles Marquez MRN: [id number] DOB: 1956/02/15 Today's Date: 09/06/2018    History of present illness A 62 year old right-handed male with history of hyperlipidemia, CVA with loop recorder insertion 04/10/2017 as well as right occipital VP shunt placement in 2016.  Lives with spouse.  The patient with recent admission 08/04/2018 for unsteady gait, nausea, and vomiting.  Findings of malfunction of the VP shunt with revision 08/05/2018 discharge to home and readmitted 08/08/2018 to 08/22/2018 for VP shunt infection, Underwent removal of VP shunt with placement of external ventriculostomy. Coompleted 2 week antibiotic then DC home. Readmitted 08/26/18 to hospital due to decline and unsteady gait and aphasia. Underwent shunt revision and ventriculoperitoneal. DC to [facility] for rehab. REadmitt4ed to acute care form CIR 09/02/2018 due to decline. Patient is s/p left proximal shunt revision on 09/02/2018.    OT comments  Pt making steady progress. Continues to require min A with mobility and min to mod A with ADL due to apparent sensory motor and cognitive deficits. Will continue to follow acutely to facilitate DC to next venue of care.   Follow Up Recommendations  Supervision/Assistance - 24 hour;CIR    Equipment Recommendations  3 in 1 bedside commode    Recommendations for Other Services Rehab consult    Precautions / Restrictions Precautions Precautions: Fall       Mobility Bed Mobility               General bed mobility comments: OOB in chair  Transfers Overall transfer level: Needs assistance Equipment used: 1 person hand held assist Transfers: Sit to/from Stand Sit to Stand: Min assist              Balance             Standing balance-Leahy Scale: Fair                             ADL either performed or assessed with clinical judgement   ADL Overall ADL's : Needs assistance/impaired    Eating/Feeding Details (indicate cue type and reason): family reoprts pt doing better with finger foods; states he sis doing better managing utensils Grooming: Minimal assistance Grooming Details (indicate cue type and reason): Pt with improved ability to sequence functional tasks; able to put on toothpaste adn brush although brushed teeth for approximately2 seoncds before spitting out toothpaste. Pt distracted by grey emesis basin on sink and began filling basin with water instead of spitting into sink.      Lower Body Bathing: Moderate assistance;Sit to/from stand   Upper Body Dressing : Moderate assistance;Sitting   Lower Body Dressing: Moderate assistance;Sit to/from stand   Toilet Transfer: Minimal assistance   Toileting- Clothing Manipulation and Hygiene: Minimal assistance       Functional mobility during ADLs: Minimal assistance General ADL Comments: Continued to educate fmaily on need to minimize distractions during funcitonal activities, inclduing minimizing visual distractions to increase independence with ADL.      Vision   Additional Comments: decreased visual attention. Will continue to assess   Perception     Praxis      Cognition Arousal/Alertness: Awake/alert Behavior During Therapy: Impulsive Overall Cognitive Status: Impaired/Different from baseline Area of Impairment: Attention;Memory;Following commands;Safety/judgement;Awareness;Problem solving                   Current Attention Level: Selective Memory: Decreased short-term memory Following Commands: Follows one step commands inconsistently Safety/Judgement: Decreased awareness  of safety;Decreased awareness of deficits Awareness: Intellectual Problem Solving: Slow processing;Difficulty sequencing;Requires verbal cues;Requires tactile cues          Exercises     Shoulder Instructions       General Comments poor orientation when sittin gin chair. Pt distracted and required mod A to sit  in chair and not miss chair when sitting with 1 hip on edge of recliner. Poor awareness of mistake    Pertinent Vitals/ Pain       Pain Assessment: Faces Faces Pain Scale: No hurt  Home Living                                          Prior Functioning/Environment              Frequency  Min 2X/week        Progress Toward Goals  OT Goals(current goals can now be found in the care plan section)  Progress towards OT goals: Progressing toward goals  Acute Rehab OT Goals Patient Stated Goal: to stop having declines in health per wife and daughter OT Goal Formulation: With patient/family Time For Goal Achievement: 09/18/18 Potential to Achieve Goals: Good ADL Goals Pt Will Perform Eating: with supervision;sitting Pt Will Perform Grooming: with set-up;with supervision;standing Pt Will Perform Upper Body Bathing: with set-up;with supervision;sitting Pt Will Perform Lower Body Bathing: sit to/from stand;with min guard assist Pt Will Perform Lower Body Dressing: with supervision;with set-up;sit to/from stand Pt Will Transfer to Toilet: with min guard assist;ambulating;regular height toilet Pt Will Perform Toileting - Clothing Manipulation and hygiene: with modified independence;sit to/from stand Additional ADL Goal #1: Pt will attend to 3 step ADL task in non distracting enviorment with supervision.  Plan Discharge plan needs to be updated;Frequency remains appropriate    Co-evaluation                 AM-PAC PT "6 Clicks" Daily Activity     Outcome Measure   Help from another person eating meals?: A Little Help from another person taking care of personal grooming?: A Little Help from another person toileting, which includes using toliet, bedpan, or urinal?: A Little Help from another person bathing (including washing, rinsing, drying)?: A Lot Help from another person to put on and taking off regular upper body clothing?: A Lot Help from another  person to put on and taking off regular lower body clothing?: A Lot 6 Click Score: 15    End of Session    OT Visit Diagnosis: Other abnormalities of gait and mobility (R26.89);Muscle weakness (generalized) (M62.81);Low vision, both eyes (H54.2);Apraxia (R48.2);Other symptoms and signs involving cognitive function;Hemiplegia and hemiparesis   Activity Tolerance Patient tolerated treatment well   Patient Left in chair;with call bell/phone within reach   Nurse Communication          Time: 1610-9604 OT Time Calculation (min): 15 min  Charges: OT General Charges $OT Visit: 1 Visit OT Treatments $Self Care/Home Management : 8-22 mins  Luisa Dago, OT/L   Acute OT Clinical Specialist Acute Rehabilitation Services Pager [phone] Office [phone]    [facility] 09/06/2018, 5:27 PM

## 2018-09-06 NOTE — Progress Notes (Signed)
Overall continues to look better.  Patient wide awake and interactive.  Speech still with some word substitution errors and some association problems.  Moving both upper and lower extremities well.  Wound clean and dry.  Shunt pumps and refills easily.  Patient remains afebrile.  Overall doing very well.  Continue current rehab efforts.  Assess on Monday for possible home discharge versus inpatient rehabilitation.

## 2018-09-06 NOTE — Progress Notes (Signed)
SLP Cancellation Note  Patient Details Name: Candido Flott MRN: [id number] DOB: [birth year]   Cancelled treatment:       Reason Eval/Treat Not Completed: Other (comment) Pt off unit with wife. Will continue efforts   Blenda Mounts Laurice 09/06/2018, 3:46 PM

## 2018-09-07 ENCOUNTER — Encounter (HOSPITAL_COMMUNITY): Payer: Self-pay | Admitting: *Deleted

## 2018-09-07 ENCOUNTER — Other Ambulatory Visit: Payer: Self-pay

## 2018-09-07 LAB — ANAEROBIC CULTURE

## 2018-09-07 NOTE — Progress Notes (Signed)
Patient ID: Charles Marquez, male   DOB: 10-01-1956, 62 y.o.   MRN: [id number] Patient doing well awake and alert complaints  Incision clean dry and intact neurologically nonfocal  Continue observation possible transfer back to rehabilitation first of the week

## 2018-09-08 ENCOUNTER — Encounter (HOSPITAL_COMMUNITY): Payer: Self-pay | Admitting: *Deleted

## 2018-09-08 NOTE — Progress Notes (Signed)
Subjective: Patient reports feeling better less headache more alert  Objective: Vital signs in last 24 hours: Temp:  [98.6 F (37 C)-98.7 F (37.1 C)] 98.6 F (37 C) (10/26 2056) Pulse Rate:  [92-98] 92 (10/26 2056) Resp:  [16-20] 16 (10/26 2056) BP: (116-123)/(82-91) 123/89 (10/26 2056) SpO2:  [97 %-98 %] 98 % (10/26 2056)  Intake/Output from previous day: 10/26 0701 - 10/27 0700 In: 480 [P.O.:480] Out: 2150 [Urine:2150] Intake/Output this shift: No intake/output data recorded.  neurologically improved incisions clean dry and intact  Lab Results: No results for input(s): WBC, HGB, HCT, PLT in the last 72 hours. BMET No results for input(s): NA, K, CL, CO2, GLUCOSE, BUN, CREATININE, CALCIUM in the last 72 hours.  Studies/Results: No results found.  Assessment/Plan: Postop day 3 shunt revision doing fairly well significant improvement preoperative symptoms. Continue to mobilize and observe  LOS: 6 days     Charles Marquez P 09/08/2018, 8:42 AM

## 2018-09-09 ENCOUNTER — Telehealth: Payer: Self-pay | Admitting: Cardiology

## 2018-09-09 LAB — CUP PACEART REMOTE DEVICE CHECK
Implantable Pulse Generator Implant Date: 20180529
MDC IDC SESS DTM: 20191016013555

## 2018-09-09 NOTE — Progress Notes (Signed)
Occupational Therapy Treatment Patient Details Name: Charles Marquez MRN: [id number] DOB: 1956/08/07 Today's Date: 09/09/2018    History of present illness A 62 year old right-handed male with history of hyperlipidemia, CVA with loop recorder insertion 04/10/2017 as well as right occipital VP shunt placement in 2016.  Lives with spouse.  The patient with recent admission 08/04/2018 for unsteady gait, nausea, and vomiting.  Findings of malfunction of the VP shunt with revision 08/05/2018 discharge to home and readmitted 08/08/2018 to 08/22/2018 for VP shunt infection, Underwent removal of VP shunt with placement of external ventriculostomy. Coompleted 2 week antibiotic then DC home. Readmitted 08/26/18 to hospital due to decline and unsteady gait and aphasia. Underwent shunt revision and ventriculoperitoneal. DC to CIR for rehab. REadmitt4ed to acute care form CIR 09/02/2018 due to decline. Patient is s/p left proximal shunt revision on 09/02/2018.    OT comments  This 63 yo male admitted and underwent above seen today for OT treatment focusing on one step tasks and recalling the task as well as education with wife on what activities she can do with him (she is doing a great job) in between therapies. Pt will continue to benefit from acute OT with follow up OT on CIR before home.   Follow Up Recommendations  Supervision/Assistance - 24 hour;CIR    Equipment Recommendations  3 in 1 bedside commode    Recommendations for Other Services Rehab consult    Precautions / Restrictions Precautions Precautions: Fall Precaution Comments: extremely short memory, 5 sec (does better if he repeats what he is asked to do) Restrictions Weight Bearing Restrictions: No       Mobility Bed Mobility Overal bed mobility: Needs Assistance Bed Mobility: Supine to Sit     Supine to sit: Min assist     General bed mobility comments: pt up in recliner upon arrival  Transfers Overall transfer level:  Needs assistance Equipment used: 1 person hand held assist Transfers: Sit to/from Stand Sit to Stand: Min assist         General transfer comment: pt unsteady upon standing with posterior lean and staggering stepping with feet. Pt with good technique to return to sitting    Balance Overall balance assessment: Needs assistance Sitting-balance support: Feet supported;No upper extremity supported Sitting balance-Leahy Scale: Good Sitting balance - Comments: able to don shoes on while sitting EOB   Standing balance support: No upper extremity supported Standing balance-Leahy Scale: Poor Standing balance comment: statically he does well (fair), dynamically he needs external support                           ADL either performed or assessed with clinical judgement   ADL                                         General ADL Comments: Wife reported she and son A'd pt with shower over the weekend with staff S. She reports having to tell him almost every step of what he needed to do, but pt did bath. She also reported that he dressed himself once each item was presented with A for standing. We talked about him doing his bath and she trying not to cue him as well as handing him all of clothes and asking him to dress--to then see how he does with more of an "open" task.  Vision Baseline Vision/History: Wears glasses Wears Glasses: At all times            Cognition Arousal/Alertness: Awake/alert Behavior During Therapy: Flat affect Overall Cognitive Status: Impaired/Different from baseline Area of Impairment: Attention;Memory;Following commands;Safety/judgement;Awareness;Problem solving                 Orientation Level: Disoriented to;Place;Time;Situation Current Attention Level: Focused Memory: Decreased short-term memory(difficulty with remembering with 1 step commands, does better with repeating command to help remember for longer in  non-distracting environment) Following Commands: Follows one step commands inconsistently;Follows multi-step commands inconsistently Safety/Judgement: Decreased awareness of safety;Decreased awareness of deficits Awareness: Intellectual Problem Solving: Slow processing;Decreased initiation;Requires verbal cues;Difficulty sequencing;Requires tactile cues General Comments: Pt able to follow step by step commands if given directions on the go, but if you give him a direction and then say something else to him he forgets what he is doing. He did much better if not distracted and if he repeated what he was to do after told what to do. He had great difficulty with telling colors, letters, and finding items asked of him on bulletin board. He would often perserverate on one thing (color, letter). Wife is a Midwife and so we talked about flash cards with numbers/letters/colors and that if pt perseverates to stop the task and rest or move onto another activity. Also to work on giving him 2 choices and having him pick the one she askes him for. We also discussed her helping him to journal each of his activites during the day so he can look back at them to see what he had done that day.                   Pertinent Vitals/ Pain       Pain Assessment: No/denies pain Faces Pain Scale: No hurt  Home Living Family/patient expects to be discharged to:: Inpatient rehab                                            Frequency  Min 2X/week        Progress Toward Goals  OT Goals(current goals can now be found in the care plan section)  Progress towards OT goals: Progressing toward goals     Plan Discharge plan remains appropriate;Frequency remains appropriate       AM-PAC PT "6 Clicks" Daily Activity     Outcome Measure   Help from another person eating meals?: A Little Help from another person taking care of personal grooming?: A Little Help from another person  toileting, which includes using toliet, bedpan, or urinal?: A Little Help from another person bathing (including washing, rinsing, drying)?: A Little Help from another person to put on and taking off regular upper body clothing?: A Little Help from another person to put on and taking off regular lower body clothing?: A Little 6 Click Score: 18    End of Session Equipment Utilized During Treatment: Gait belt  OT Visit Diagnosis: Other abnormalities of gait and mobility (R26.89);Muscle weakness (generalized) (M62.81);Apraxia (R48.2);Other symptoms and signs involving cognitive function   Activity Tolerance Patient tolerated treatment well   Patient Left in chair;with call bell/phone within reach;with chair alarm set;with family/visitor present           Time: 1610-9604 OT Time Calculation (min): 35 min  Charges: OT General Charges $OT Visit: 1 Visit  OT Treatments $Self Care/Home Management : 23-37 mins  Ignacia Palma, OTR/L Acute [employer] Pager [phone] Office [phone]      Evette Georges 09/09/2018, 11:27 AM

## 2018-09-09 NOTE — Telephone Encounter (Signed)
LMOVM requesting that pt send manual transmission b/c home monitor has not updated in at least 14 days.    

## 2018-09-09 NOTE — H&P (Signed)
Physical Medicine and Rehabilitation Admission H&P     HPI: Charles Marquez is a 62 year old right-handed male with history of hyperlipidemia, 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  Per chart review and wife, patient lives with spouse.  He was working on Consulting civil engineer for [employer].  Patient ambulating without assistive device after a recent discharge.  Two-level home 5 steps to entry.  Patient with recent admission 08/04/2018 for unsteadiness of gait and nausea vomiting findings of malfunction of VP shunt with revision completed 08/05/2018 and discharged home.  He was readmitted 08/08/2018 to 08/22/2018 for VP shunt infection organism isolated Klebsiella.  He again underwent removal of VP shunt with placement of external ventriculostomy and completed a 2-week course of IV antibiotics.  Follow-up scan reviewed showing right hydrocephalus.  Per report, mildly enlarged right lateral ventricle but left ventricle and third ventricle and fourth ventricle were small.  Noted generalized decline with unsteady gait noted bouts of aphasia.  He was again readmitted 08/26/2018 for shunt revision ventricular peritoneal 08/26/2018 per Dr. Jordan Likes.  Subtends heparin added for DVT prophylaxis 08/29/2018.  He was admitted to inpatient rehab services 08/30/2018 with slow but progressive gains.  Patient with generalized decline more somnolence 09/01/2018.  A follow-up CT of the head showed stable appearance of ventriculomegaly with unchanged positioning of ventriculostomy catheter.  Follow-up neurosurgery suspect VP malfunction.  He was discharged to acute care services 09/02/2018 and again underwent shunt revision per Dr. Jordan Likes.  Latest cranial CT scan 09/05/2018 showed nearly resolved hydrocephalus stable appearance of VP shunt.  Therapies again resumed with progressive gains and patient was readmitted to inpatient rehab services to continue comprehensive  rehabilitation.  Review of Systems  Unable to perform ROS: Language   Past Medical History:  Diagnosis Date  . Anxiety   . Hypercholesteremia   . Stroke (HCC)    tia's  . TIA (transient ischemic attack)    09.15   Past Surgical History:  Procedure Laterality Date  . Fractured arm Left 12  . HERNIA REPAIR Right 3/12  . LAPAROSCOPIC REVISION VENTRICULAR-PERITONEAL (V-P) SHUNT N/A 08/26/2018   Procedure: LAPAROSCOPIC INSERTION VENTRICULAR-PERITONEAL (V-P) SHUNT;  Surgeon: Julio Sicks, MD;  Location: [facility] OR;  Service: Neurosurgery;  Laterality: N/A;  . LOOP RECORDER INSERTION N/A 04/10/2017   Procedure: Loop Recorder Insertion;  Surgeon: Hillis Range, MD;  Location: [facility] INVASIVE CV LAB;  Service: Cardiovascular;  Laterality: N/A;  . SHUNT REMOVAL Right 03/13/2016   Procedure: SHUNT REMOVAL;  Surgeon: Julio Sicks, MD;  Location: [facility] NEURO ORS;  Service: Neurosurgery;  Laterality: Right;  . SHUNT REMOVAL Right 08/09/2018   Procedure: SHUNT REMOVAL With Placement of Ventricular Catheter;  Surgeon: Lisbeth Renshaw, MD;  Location: [facility] OR;  Service: Neurosurgery;  Laterality: Right;  . SHUNT REVISION Right 08/05/2018   Procedure: SHUNT REVISION;  Surgeon: Julio Sicks, MD;  Location: [facility];  Service: Neurosurgery;  Laterality: Right;  . SHUNT REVISION VENTRICULAR-PERITONEAL Left 08/26/2018   Procedure: SHUNT REVISION VENTRICULAR-PERITONEAL;  Surgeon: Julio Sicks, MD;  Location: [facility] OR;  Service: Neurosurgery;  Laterality: Left;  . SHUNT REVISION VENTRICULAR-PERITONEAL Left 09/02/2018   Procedure: Left Occipital VP shunt revision;  Surgeon: Julio Sicks, MD;  Location: [facility] OR;  Service: Neurosurgery;  Laterality: Left;  [name] VASECTOMY  10/02/1997  . 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  VENTRICULOPERITONEAL SHUNT Right 07/22/2018   Procedure: Shunt Placment right occipital;  Surgeon: Julio Sicks, MD;  Location: [facility] OR;  Service: Neurosurgery;  Laterality: Right;  . VENTRICULOPERITONEAL SHUNT Left 08/26/2018   Procedure: LEFT SIDED VENTRICULAR-PERITONEAL SHUNT;  Surgeon: Julio Sicks, MD;  Location: [facility] OR;  Service: Neurosurgery;  Laterality: Left;  [name] VENTRICULOSTOMY Right 08/09/2018   Procedure: VENTRICULOSTOMY;  Surgeon: Lisbeth Renshaw, MD;  Location: [facility] OR;  Service: Neurosurgery;  Laterality: Right;   Family History  Problem Relation Age of Onset  . COPD Mother   . Lung cancer Father   . Alzheimer's disease Father    Social History:  reports that he has never smoked. He has never used smokeless tobacco. He reports that he drinks alcohol. He reports that he does not use drugs. Allergies: No Known Allergies Medications Prior to Admission  Medication Sig Dispense Refill  . acetaminophen (TYLENOL) 500 MG tablet Take 500-1,000 mg by mouth every 6 (six) hours as needed for headache (pain).    [name] atorvastatin (LIPITOR) 40 MG tablet Take 1 tablet (40 mg total) by mouth daily. 90 tablet 3  . hydroxypropyl methylcellulose / hypromellose (ISOPTO TEARS / GONIOVISC) 2.5 % ophthalmic solution Place 1 drop into both eyes as needed for dry eyes.    . Multiple Vitamins-Minerals (OCUVITE EYE HEALTH FORMULA) CAPS Take 1 capsule by mouth daily.    . ondansetron (ZOFRAN) 4 MG tablet Take 4 mg by mouth 4 (four) times daily as needed for nausea or vomiting.    . sodium chloride (OCEAN) 0.65 % SOLN nasal spray Place 1 spray into both nostrils as needed for congestion.    . traMADol (ULTRAM) 50 MG tablet Take 1 tablet (50 mg total) by mouth every 6 (six) hours as needed. (Patient taking differently: Take 50 mg by mouth at bedtime as needed (pain). ) 30 tablet 1  . Artificial Tear Ointment (DRY EYES OP) Apply 1 drop to eye daily as needed (for drye eyes).    . magic mouthwash SOLN Take 5 mLs by  mouth 4 (four) times daily as needed for mouth pain. 100 mL 0    Drug Regimen Review Drug regimen was reviewed and remains appropriate with no significant issues identified  Home: Home Living Family/patient expects to be discharged to:: Inpatient rehab Living Arrangements: Spouse/significant other Available Help at Discharge: Family, Available 24 hours/day Type of Home: House  Lives With: Spouse   Functional History: Prior Function Level of Independence: Needs assistance Gait / Transfers Assistance Needed: Per wife, was working with therapy services on ambulation with and without AD.  ADL's / Homemaking Assistance Needed: Required assist for ADLs in CIR.  Communication / Swallowing Assistance Needed: receptive and expressive communication deficits  Functional Status:  Mobility: Bed Mobility Overal bed mobility: Needs Assistance Bed Mobility: Supine to Sit Supine to sit: Min assist General bed mobility comments: pt up in recliner upon arrival Transfers Overall transfer level: Needs assistance Equipment used: 1 person hand held assist Transfers: Sit to/from Stand Sit to Stand: Min assist Stand pivot transfers: Mod assist General transfer comment: pt unsteady upon standing with posterior lean and staggering stepping with feet. Pt with good technique to return to sitting Ambulation/Gait Ambulation/Gait assistance: Min assist Gait Distance (Feet): 150 Feet(with one seated rest break) Assistive device: 1 person hand held assist Gait Pattern/deviations: Step-through pattern, Decreased stride length, Shuffle, Staggering left, Staggering right General Gait Details: pt shuffling/inability to  clear feet 50% of time requiring minA to prevent fall forward. pt constantly looking to wife for guidance on what to do and where to go. Pt able to follow immeadiate directional commands however not multistep commands Gait velocity: slow Gait velocity interpretation: <1.8 ft/sec, indicate of risk  for recurrent falls    ADL: ADL Overall ADL's : Needs assistance/impaired Eating/Feeding: Moderate assistance Eating/Feeding Details (indicate cue type and reason): family reoprts pt doing better with finger foods; states he sis doing better managing utensils Grooming: Minimal assistance Grooming Details (indicate cue type and reason): Pt with improved ability to sequence functional tasks; able to put on toothpaste adn brush although brushed teeth for approximately2 seoncds before spitting out toothpaste. Pt distracted by grey emesis basin on sink and began filling basin with water instead of spitting into sink.  Upper Body Bathing: Maximal assistance, Sitting Lower Body Bathing: Moderate assistance, Sit to/from stand Upper Body Dressing : Moderate assistance, Sitting Lower Body Dressing: Moderate assistance, Sit to/from stand Toilet Transfer: Minimal assistance Toileting- Clothing Manipulation and Hygiene: Minimal assistance Toileting - Clothing Manipulation Details (indicate cue type and reason): does not appear to assiciate sittin gon toilet with trying to use the bathroom; pt appeared to be perseverating on brushing teeth Functional mobility during ADLs: Minimal assistance General ADL Comments: Wife reported she and son A'd pt with shower over the weekend with staff S. She reports having to tell him almost every step of what he needed to do, but pt did bath. She also reported that he dressed himself once each item was presented with A for standing. We talked about him doing his bath and she trying not to cue him as well as handing him all of clothes and asking him to dress--to then see how he does with more of an "open" task.   Cognition: Cognition Overall Cognitive Status: Impaired/Different from baseline Arousal/Alertness: Awake/alert Orientation Level: Oriented to person, Oriented to time, Disoriented to place, Disoriented to situation Attention: Sustained Sustained Attention:  Impaired Sustained Attention Impairment: Functional basic, Verbal basic Awareness: Impaired Awareness Impairment: Intellectual impairment Problem Solving: Impaired Problem Solving Impairment: Verbal basic, Functional basic Executive Function: (all areas impacted by lower level deficits) Safety/Judgment: Impaired Comments: decreased awareness of deficits and decreased safety awareness Cognition Arousal/Alertness: Awake/alert Behavior During Therapy: Flat affect Overall Cognitive Status: Impaired/Different from baseline Area of Impairment: Attention, Memory, Following commands, Safety/judgement, Awareness, Problem solving Orientation Level: Disoriented to, Place, Time, Situation Current Attention Level: Focused Memory: Decreased short-term memory(difficulty with remembering with 1 step commands, does better with repeating command to help remember for longer in non-distracting environment) Following Commands: Follows one step commands inconsistently, Follows multi-step commands inconsistently Safety/Judgement: Decreased awareness of safety, Decreased awareness of deficits Awareness: Intellectual Problem Solving: Slow processing, Decreased initiation, Requires verbal cues, Difficulty sequencing, Requires tactile cues General Comments: Pt able to follow step by step commands if given directions on the go, but if you give him a direction and then say something else to him he forgets what he is doing. He did much better if not distracted and if he repeated what he was to do after told what to do. He had great difficulty with telling colors, letters, and finding items asked of him on bulletin board. He would often perserverate on one thing (color, letter). Wife is a Midwife and so we talked about flash cards with numbers/letters/colors and that if pt perseverates to stop the task and rest or move onto another activity. Also to work on giving him  2 choices and having him pick the one she  askes him for. We also discussed her helping him to journal each of his activites during the day so he can look back at them to see what he had done that day.  Physical Exam: Blood pressure (!) 128/91, pulse (!) 117, temperature 97.6 F (36.4 C), temperature source Oral, resp. rate 20, weight 80.6 kg, SpO2 98 %. Physical Exam  Vitals reviewed. Constitutional: He appears well-developed and well-nourished. No distress.  HENT:  Head: Normocephalic.  Eyes: Pupils are equal, round, and reactive to light. EOM are normal.  Neck: No tracheal deviation present. No thyromegaly present.  Cardiovascular: Normal rate. Exam reveals no friction rub.  No murmur heard. Respiratory: Effort normal. No respiratory distress. He has no wheezes.  GI: Soft. He exhibits no distension. There is no tenderness.  Neurological:  Alert. Oriented to month, hospital, person. Follows simple commands but apraxic with significant delays in processing, apraxic. Can perseverate on a given task. Strength nearly 4 to 4+/5 bilaterally. Ongoing intentional tremor LUE and LLE.   Psychiatric: He has a normal mood and affect. His behavior is normal.    No results found for this or any previous visit (from the past 48 hour(s)). No results found.     Medical Problem List and Plan: 1.  Unsteady gait with aphasia secondary to ventriculoperitoneal shunt malfunction status post revision 09/02/2018  -readmitting to inpatient rehab 2.  DVT Prophylaxis/Anticoagulation: SCDs.  Monitor for any signs of DVT 3. Pain Management: Ultram as needed 4. Mood: Provide emotional support 5. Neuropsych: This patient is capable of making decisions on his own behalf. 6. Skin/Wound Care: Routine skin checks 7. Fluids/Electrolytes/Nutrition: Routine in and outs with follow-up chemistries 8.  Constipation.  Laxative assistance 9.  Hyperlipidemia.  Lipitor   Post Admission Physician Evaluation: 1. Functional deficits secondary  to hydrocephalus,  vps malfunction. 2. Patient is admitted to receive collaborative, interdisciplinary care between the physiatrist, rehab nursing staff, and therapy team. 3. Patient's level of medical complexity and substantial therapy needs in context of that medical necessity cannot be provided at a lesser intensity of care such as a SNF. 4. Patient has experienced substantial functional loss from his/her baseline which was documented above under the "Functional History" and "Functional Status" headings.  Judging by the patient's diagnosis, physical exam, and functional history, the patient has potential for functional progress which will result in measurable gains while on inpatient rehab.  These gains will be of substantial and practical use upon discharge  in facilitating mobility and self-care at the household level. 5. Physiatrist will provide 24 hour management of medical needs as well as oversight of the therapy plan/treatment and provide guidance as appropriate regarding the interaction of the two. 6. The Preadmission Screening has been reviewed and patient status is unchanged unless otherwise stated above. 7. 24 hour rehab nursing will assist with bladder management, bowel management, safety, skin/wound care, disease management, medication administration, pain management and patient education  and help integrate therapy concepts, techniques,education, etc. 8. PT will assess and treat for/with: Lower extremity strength, range of motion, stamina, balance, functional mobility, safety, adaptive techniques and equipment, NMR, balance, cognitive-perceptual rx.   Goals are: supervision. 9. OT will assess and treat for/with: ADL's, functional mobility, safety, upper extremity strength, adaptive techniques and equipment, NMR, family, cognitive perceptual rx.   Goals are: supervision. Therapy may proceed with showering this patient. 10. SLP will assess and treat for/with: cognition, communication, family ed.  Goals are:  supervision. 11. Case Management and Social Worker will assess and treat for psychological issues and discharge planning. 12. Team conference will be held weekly to assess progress toward goals and to determine barriers to discharge. 13. Patient will receive at least 3 hours of therapy per day at least 5 days per week. 14. ELOS: 7 days       15. Prognosis:  excellent   I have personally performed a face to face diagnostic evaluation of this patient and formulated the key components of the plan.  Additionally, I have personally reviewed laboratory data, imaging studies, as well as relevant notes and concur with the physician assistant's documentation above.  Ranelle Oyster, MD, FAAPMR    Mcarthur Rossetti Angiulli, PA-C 09/09/2018

## 2018-09-09 NOTE — Progress Notes (Signed)
Inpatient Rehabilitation Admissions Coordinator  I met with patient and his wife at bedside. Also discussed with Dr. Annette Stable. Wife would like to pursue an inpt rehab admission. I will begin insurance authorization for a possible admit. As before, his insurance does not have the benefit, but I negotiated the benefit when he was approved for 7 days previously. I will follow up once I have discussed with [place] [facility] [place].  Danne Baxter, RN, MSN Rehab Admissions Coordinator [phone] 09/09/2018 11:19 AM

## 2018-09-09 NOTE — Progress Notes (Signed)
Physical Therapy Treatment Patient Details Name: Charles Marquez MRN: [id number] DOB: 09/11/1956 Today's Date: 09/09/2018    History of Present Illness A 62 year old right-handed male with history of hyperlipidemia, CVA with loop recorder insertion 04/10/2017 as well as right occipital VP shunt placement in 2016.  Lives with spouse.  The patient with recent admission 08/04/2018 for unsteady gait, nausea, and vomiting.  Findings of malfunction of the VP shunt with revision 08/05/2018 discharge to home and readmitted 08/08/2018 to 08/22/2018 for VP shunt infection, Underwent removal of VP shunt with placement of external ventriculostomy. Coompleted 2 week antibiotic then DC home. Readmitted 08/26/18 to hospital due to decline and unsteady gait and aphasia. Underwent shunt revision and ventriculoperitoneal. DC to CIR for rehab. REadmitt4ed to acute care form CIR 09/02/2018 due to decline. Patient is s/p left proximal shunt revision on 09/02/2018.     PT Comments    Pt tolerated increased ambulation tolerance this date with assist x1 however continues to demonstrate severe cognitive deficits. Pt unable to recall things greater than 5 sec in regard to memory. Pt with poor initiation/processing of task constantly asking wife "what am I doing?" Pt with no recall of surgeries or being in rehab. Pt cont to have difficulty clearing feet as well during ambulation and requires minA for safe amb to prevent forward fall. Pt requires constant directional v/c's to perform ADLs and to re-orient patient. Pt cont to strongly benefit from CIR upon d/c to address both cognitive and functional deficits for safe transition home with spouse.   Follow Up Recommendations  CIR     Equipment Recommendations  None recommended by PT    Recommendations for Other Services Rehab consult     Precautions / Restrictions Precautions Precautions: Fall Precaution Comments: extremely short memory, 5 sec Restrictions Weight  Bearing Restrictions: No    Mobility  Bed Mobility Overal bed mobility: Needs Assistance Bed Mobility: Supine to Sit     Supine to sit: Min assist     General bed mobility comments: pt mildly impulsive, minA for safety due to instability  Transfers Overall transfer level: Needs assistance Equipment used: 1 person hand held assist Transfers: Sit to/from Stand Sit to Stand: Min assist         General transfer comment: pt unsteady upon standing with posterior lean and staggering stepping with feet. Pt with good technique to return to sitting  Ambulation/Gait Ambulation/Gait assistance: Min assist Gait Distance (Feet): 150 Feet(with one seated rest break) Assistive device: 1 person hand held assist Gait Pattern/deviations: Step-through pattern;Decreased stride length;Shuffle;Staggering left;Staggering right Gait velocity: slow Gait velocity interpretation: <1.8 ft/sec, indicate of risk for recurrent falls General Gait Details: pt shuffling/inability to clear feet 50% of time requiring minA to prevent fall forward. pt constantly looking to wife for guidance on what to do and where to go. Pt able to follow immeadiate directional commands however not multistep commands   Stairs             Wheelchair Mobility    Modified Rankin (Stroke Patients Only)       Balance Overall balance assessment: Needs assistance Sitting-balance support: Feet supported;No upper extremity supported Sitting balance-Leahy Scale: Fair Sitting balance - Comments: able to don shoes on while sitting EOB   Standing balance support: No upper extremity supported Standing balance-Leahy Scale: Poor Standing balance comment: pt attempted to pull up short in standing and required modA to prevent posterior fall, minA to maintain balance while at sink completeing ADLs  Cognition Arousal/Alertness: Awake/alert Behavior During Therapy: Flat affect Overall  Cognitive Status: Impaired/Different from baseline Area of Impairment: Orientation;Attention;Memory;Following commands;Safety/judgement;Problem solving;Awareness                 Orientation Level: Disoriented to;Place;Time;Situation Current Attention Level: Sustained Memory: Decreased short-term memory(cant remember >5 sec) Following Commands: Follows one step commands consistently;Follows multi-step commands inconsistently(unable to follow multi-step commands) Safety/Judgement: Decreased awareness of safety;Decreased awareness of deficits Awareness: Intellectual Problem Solving: Slow processing;Decreased initiation;Requires verbal cues;Difficulty sequencing;Requires tactile cues General Comments: pt unable to follow directions if greater than 5 sec ie. turn around at the doors, pt able to follow if less than 5 sec. Pt placed in front of sink to wash hands after tolieting. pt washed hands and then asked PT "what am I doing?" while hands were still wet. pt was unable to process the need to dry hands, requiring step by step commands to complete task. same with brushing teeth      Exercises      General Comments        Pertinent Vitals/Pain Pain Assessment: Faces Faces Pain Scale: No hurt    Home Living                      Prior Function            PT Goals (current goals can now be found in the care plan section) Progress towards PT goals: Progressing toward goals    Frequency    Min 3X/week      PT Plan Current plan remains appropriate    Co-evaluation              AM-PAC PT "6 Clicks" Daily Activity  Outcome Measure  Difficulty turning over in bed (including adjusting bedclothes, sheets and blankets)?: A Little Difficulty moving from lying on back to sitting on the side of the bed? : A Little Difficulty sitting down on and standing up from a chair with arms (e.g., wheelchair, bedside commode, etc,.)?: A Little Help needed moving to and from a  bed to chair (including a wheelchair)?: A Little Help needed walking in hospital room?: A Lot Help needed climbing 3-5 steps with a railing? : A Lot 6 Click Score: 16    End of Session Equipment Utilized During Treatment: Gait belt Activity Tolerance: Patient tolerated treatment well Patient left: in chair;with call bell/phone within reach;with chair alarm set;with family/visitor present Nurse Communication: Mobility status PT Visit Diagnosis: Unsteadiness on feet (R26.81);Difficulty in walking, not elsewhere classified (R26.2);Other symptoms and signs involving the nervous system (R29.898)     Time: 1610-9604 PT Time Calculation (min) (ACUTE ONLY): 39 min  Charges:  $Gait Training: 23-37 mins $Therapeutic Activity: 8-22 mins                     Lewis Shock, PT, DPT Acute Rehabilitation Services Pager #: [phone] Office #: [phone]    Iona Hansen 09/09/2018, 8:53 AM

## 2018-09-09 NOTE — Progress Notes (Signed)
No events or new problems over the weekend.  Patient denies headache.  He continues to make slow steady progress with therapy.  He is ambulating under his own power.  His motor coordination is improving.  He continues to have significant difficulty with both short and long-term memory.  He continues to have some anomia and word association problems he continues to have some neuropraxia.  He is afebrile.  His vital signs are stable.  He is awake and alert.  He is oriented and reasonably appropriate.  Cranial nerve function is intact.  Motor 5/5 bilaterally.  No pronator drift.  Wounds well-healed.  Shunt pumps and refills easily.  Progressing reasonably well following VP shunt revision.  Patient still with significant neurocognitive issues precluding home discharge at this point.  Patient ready for inpatient rehabilitation.

## 2018-09-10 ENCOUNTER — Other Ambulatory Visit: Payer: Self-pay

## 2018-09-10 ENCOUNTER — Inpatient Hospital Stay (HOSPITAL_COMMUNITY)
Admission: RE | Admit: 2018-09-10 | Discharge: 2018-09-18 | DRG: 092 | Disposition: A | Discharge status: Home / Self Care | Payer: BLUE CROSS/BLUE SHIELD | Source: Intra-hospital | Attending: Physical Medicine & Rehabilitation | Admitting: Physical Medicine & Rehabilitation

## 2018-09-10 ENCOUNTER — Encounter (HOSPITAL_COMMUNITY): Payer: Self-pay | Admitting: *Deleted

## 2018-09-10 DIAGNOSIS — D62 Acute posthemorrhagic anemia: Secondary | ICD-10-CM | POA: Diagnosis present

## 2018-09-10 DIAGNOSIS — E785 Hyperlipidemia, unspecified: Secondary | ICD-10-CM | POA: Diagnosis present

## 2018-09-10 DIAGNOSIS — R4701 Aphasia: Secondary | ICD-10-CM | POA: Diagnosis present

## 2018-09-10 DIAGNOSIS — R2689 Other abnormalities of gait and mobility: Principal | ICD-10-CM | POA: Diagnosis present

## 2018-09-10 DIAGNOSIS — H539 Unspecified visual disturbance: Secondary | ICD-10-CM | POA: Diagnosis not present

## 2018-09-10 DIAGNOSIS — G919 Hydrocephalus, unspecified: Secondary | ICD-10-CM | POA: Diagnosis present

## 2018-09-10 DIAGNOSIS — Z79891 Long term (current) use of opiate analgesic: Secondary | ICD-10-CM

## 2018-09-10 DIAGNOSIS — K5901 Slow transit constipation: Secondary | ICD-10-CM | POA: Diagnosis not present

## 2018-09-10 DIAGNOSIS — Z982 Presence of cerebrospinal fluid drainage device: Secondary | ICD-10-CM

## 2018-09-10 DIAGNOSIS — F419 Anxiety disorder, unspecified: Secondary | ICD-10-CM | POA: Diagnosis present

## 2018-09-10 DIAGNOSIS — E46 Unspecified protein-calorie malnutrition: Secondary | ICD-10-CM

## 2018-09-10 DIAGNOSIS — Z79899 Other long term (current) drug therapy: Secondary | ICD-10-CM | POA: Diagnosis not present

## 2018-09-10 DIAGNOSIS — K5900 Constipation, unspecified: Secondary | ICD-10-CM | POA: Diagnosis present

## 2018-09-10 DIAGNOSIS — Z8673 Personal history of transient ischemic attack (TIA), and cerebral infarction without residual deficits: Secondary | ICD-10-CM | POA: Diagnosis not present

## 2018-09-10 DIAGNOSIS — E8809 Other disorders of plasma-protein metabolism, not elsewhere classified: Secondary | ICD-10-CM | POA: Diagnosis present

## 2018-09-10 LAB — MRSA PCR SCREENING: MRSA BY PCR: NEGATIVE

## 2018-09-10 MED ORDER — ONDANSETRON HCL 4 MG PO TABS: 4.0000 mg | ORAL_TABLET | Freq: Four times a day (QID) | ORAL | Status: DC | PRN

## 2018-09-10 MED ORDER — BISACODYL 10 MG RE SUPP: 10.0000 mg | Freq: Every day | RECTAL | Status: DC | PRN

## 2018-09-10 MED ORDER — ONDANSETRON HCL 4 MG/2ML IJ SOLN: 4.0000 mg | INTRAMUSCULAR | Status: DC | PRN

## 2018-09-10 MED ORDER — ACETAMINOPHEN 650 MG RE SUPP: 650.0000 mg | RECTAL | Status: DC | PRN

## 2018-09-10 MED ORDER — BISACODYL 5 MG PO TBEC: 5.0000 mg | DELAYED_RELEASE_TABLET | Freq: Every day | ORAL | Status: DC | PRN

## 2018-09-10 MED ORDER — FAMOTIDINE 20 MG PO TABS
20.0000 mg | ORAL_TABLET | Freq: Two times a day (BID) | ORAL | Status: DC
  Administered 2018-09-10 – 2018-09-18 (×16): 20 mg via ORAL
  Filled 2018-09-10 (×16): qty 1

## 2018-09-10 MED ORDER — TRAMADOL HCL 50 MG PO TABS: 50.0000 mg | ORAL_TABLET | Freq: Every evening | ORAL | Status: DC | PRN

## 2018-09-10 MED ORDER — SALINE SPRAY 0.65 % NA SOLN: 1.0000 | NASAL | Status: DC | PRN

## 2018-09-10 MED ORDER — ATORVASTATIN CALCIUM 40 MG PO TABS
40.0000 mg | ORAL_TABLET | Freq: Every day | ORAL | Status: DC
  Administered 2018-09-11 – 2018-09-18 (×8): 40 mg via ORAL
  Filled 2018-09-10 (×8): qty 1

## 2018-09-10 MED ORDER — SORBITOL 70 % SOLN: 30.0000 mL | Freq: Every day | Status: DC | PRN

## 2018-09-10 MED ORDER — ENSURE ENLIVE PO LIQD
237.0000 mL | Freq: Two times a day (BID) | ORAL | Status: DC
  Administered 2018-09-11 – 2018-09-17 (×12): 237 mL via ORAL

## 2018-09-10 MED ORDER — ONDANSETRON HCL 4 MG PO TABS: 4.0000 mg | ORAL_TABLET | ORAL | Status: DC | PRN

## 2018-09-10 MED ORDER — POLYETHYLENE GLYCOL 3350 17 G PO PACK: 17.0000 g | PACK | Freq: Every day | ORAL | Status: DC | PRN

## 2018-09-10 MED ORDER — PROSIGHT PO TABS
1.0000 | ORAL_TABLET | Freq: Every day | ORAL | Status: DC
  Administered 2018-09-11 – 2018-09-18 (×8): 1 via ORAL
  Filled 2018-09-10 (×8): qty 1

## 2018-09-10 MED ORDER — SENNOSIDES-DOCUSATE SODIUM 8.6-50 MG PO TABS
1.0000 | ORAL_TABLET | Freq: Every day | ORAL | Status: DC
  Administered 2018-09-10 – 2018-09-17 (×8): 1 via ORAL
  Filled 2018-09-10 (×8): qty 1

## 2018-09-10 MED ORDER — ACETAMINOPHEN 325 MG PO TABS: 650.0000 mg | ORAL_TABLET | ORAL | Status: DC | PRN

## 2018-09-10 NOTE — Progress Notes (Signed)
Report given to CIR. Patient with RFA IV in place. Wife at bedside. Patient taken to CIR with all belongings.

## 2018-09-10 NOTE — Progress Notes (Signed)
Inpatient Rehabilitation Admissions Coordinator  I have insurance approval to readmit pt to inpt rehab today. I have contacted pt, and wife at bedside, Dr. Jordan Likes, RN CM and SW. I will make the arrangements to admit today.  Ottie Glazier, RN, MSN Rehab Admissions Coordinator [phone] 09/10/2018 1:14 PM '

## 2018-09-10 NOTE — Discharge Summary (Signed)
  Physician Discharge Summary  Patient ID: Charles Marquez MRN: [id number] DOB/AGE: March 03, 1956 62 y.o.  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  Patient taken to the operating room where a VP shunt placement was attempted.  Catheter was found to be mis- positioned.  Patient returned to the operating room where he had replacement of the VP shunt catheter.  The catheter was confirmed to be in good position by subsequent CT scan.  Patient made gradual improvement following treatment of his hydrocephalus.  The patient is now ambulatory.  His speech is somewhat halting but reasonably fluent otherwise.  His content is improving.  Short-term memory and cognition is still impaired.  He has no visual complaints.  He has no other complaints.  His wound is healing well.  He is remained afebrile through this hospital stay.  Consults:   Significant Diagnostic Studies:   Treatments:   Discharge Exam: Blood pressure 122/86, pulse 99, temperature (!) 97.4 F (36.3 C), temperature source Oral, resp. rate 16, weight 80.6 kg, SpO2 96 %. Patient is awake and alert.  He is oriented to person place and time.  His speech is somewhat halting.  He has poor recall to recent and remote events.  His motor strength is 5/5 bilaterally.  His wounds are well-healed.  His abdomen is soft.  His chest is clear.  Disposition:    Allergies as of 09/10/2018   No Known Allergies     Medication List    ASK your doctor about these medications   acetaminophen 500 MG tablet Commonly known as:  TYLENOL Take 500-1,000 mg by mouth every 6 (six) hours as needed for headache (pain).   atorvastatin 40 MG tablet Commonly known as:  LIPITOR Take 1 tablet (40 mg total) by mouth daily.   DRY  EYES OP Apply 1 drop to eye daily as needed (for drye eyes).   hydroxypropyl methylcellulose / hypromellose 2.5 % ophthalmic solution Commonly known as:  ISOPTO TEARS / GONIOVISC Place 1 drop into both eyes as needed for dry eyes.   magic mouthwash Soln Take 5 mLs by mouth 4 (four) times daily as needed for mouth pain.   OCUVITE EYE HEALTH FORMULA Caps Take 1 capsule by mouth daily.   ondansetron 4 MG tablet Commonly known as:  ZOFRAN Take 4 mg by mouth 4 (four) times daily as needed for nausea or vomiting.   sodium chloride 0.65 % Soln nasal spray Commonly known as:  OCEAN Place 1 spray into both nostrils as needed for congestion.   traMADol 50 MG tablet Commonly known as:  ULTRAM Take 1 tablet (50 mg total) by mouth every 6 (six) hours as needed.        Signed: Kathaleen Maser Kristiana Jacko 09/10/2018, 1:51 PM

## 2018-09-10 NOTE — IPOC Note (Signed)
Overall Plan of Care [facility]) Patient Details Name: Charles Marquez MRN: [id number] DOB: [birth year]  Admitting Diagnosis: Hydocephalus  Hospital Problems: Active Problems:   Hydrocephalus (HCC)   Hypoalbuminemia due to protein-calorie malnutrition (HCC)     Functional Problem List: Nursing Bladder, Bowel, Endurance, Medication Management, Motor, Safety, Skin Integrity  PT Balance, Behavior, Endurance, Motor, Perception, Safety  OT Balance, Cognition, Endurance, Motor, Perception, Safety  SLP Cognition, Linguistic  TR         Basic ADL's: OT Grooming, Bathing, Dressing, Toileting     Advanced  ADL's: OT       Transfers: PT Bed Mobility, Bed to Chair, Car, [employer], Floor  OT Toilet, Tub/Shower     Locomotion: PT Ambulation, Stairs     Additional Impairments: OT None  SLP Communication, Social Cognition comprehension, expression Problem Solving, Attention, Awareness  TR      Anticipated Outcomes Item Anticipated Outcome  Self Feeding Independent  Swallowing      Basic self-care  Distant S  Toileting  Distant S   Bathroom Transfers Supervision  Bowel/Bladder  managed with mod assist due to incontinence  Transfers  supervision   Locomotion  supervision   Communication  Min assist  Cognition  supervision  Pain  n/a  Safety/Judgment  min assist with min cues    Therapy Plan: PT Intensity: Minimum of 1-2 x/day ,45 to 90 minutes PT Frequency: 5 out of 7 days PT Duration Estimated Length of Stay: 7 days  OT Intensity: Minimum of 1-2 x/day, 45 to 90 minutes OT Frequency: 5 out of 7 days OT Duration/Estimated Length of Stay: 7-9 days SLP Intensity: Minumum of 1-2 x/day, 30 to 90 minutes SLP Frequency: 3 to 5 out of 7 days SLP Duration/Estimated Length of Stay: 7 days    Team Interventions: Nursing Interventions Patient/Family Education, Bladder Management, Bowel Management, Disease Management/Prevention, Medication Management, Skin Care/Wound  Management, Cognitive Remediation/Compensation, Psychosocial Support  PT interventions Ambulation/gait training, Disease management/prevention, Pain management, Stair training, Visual/perceptual remediation/compensation, Therapeutic Activities, Wheelchair propulsion/positioning, Patient/family education, DME/adaptive equipment instruction, Warden/ranger, Cognitive remediation/compensation, Functional electrical stimulation, Psychosocial support, Therapeutic Exercise, UE/LE Strength taining/ROM, Skin care/wound management, Functional mobility training, Community reintegration, Discharge planning, Neuromuscular re-education, Splinting/orthotics, UE/LE Coordination activities  OT Interventions Balance/vestibular training, Discharge planning, Pain management, Self Care/advanced ADL retraining, Therapeutic Activities, UE/LE Coordination activities, Cognitive remediation/compensation, Functional mobility training, Patient/family education, Therapeutic Exercise, DME/adaptive equipment instruction, Neuromuscular re-education, UE/LE Strength taining/ROM, Psychosocial support  SLP Interventions Cognitive remediation/compensation, Cueing hierarchy, Environmental controls, Internal/external aids, Multimodal communication approach, Patient/family education, Functional tasks  TR Interventions    SW/CM Interventions Discharge Planning, Psychosocial Support, Patient/Family Education   Barriers to Discharge MD  Medical stability and Behavior  Nursing Incontinence    PT      OT      SLP      SW       Team Discharge Planning: Destination: PT-Home ,OT- Home , SLP-Home Projected Follow-up: PT-Outpatient PT, 24 hour supervision/assistance, OT-  Outpatient OT, SLP-Outpatient SLP, 24 hour supervision/assistance, Home Health SLP Projected Equipment Needs: PT-To be determined, OT- To be determined, SLP-None recommended by SLP Equipment Details: PT- , OT-  Patient/family involved in discharge planning:  PT- Patient, Family member/caregiver,  OT-Patient, Family member/caregiver, SLP-Patient, Family member/caregiver  MD ELOS: 5-7 days. Medical Rehab Prognosis:  Good Assessment: 62 year old right-handed male with history of hyperlipidemia, 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 Patient with recent admission 08/04/2018 for unsteadiness of gait  and nausea vomiting findings of malfunction of VP shunt with revision completed 08/05/2018 and discharged home. He was readmitted 08/08/2018 to 08/22/2018 for VP shunt infection organism isolated Klebsiella. He again underwent removal of VP shunt with placement of external ventriculostomy and completed a 2-week course of IV antibiotics. Follow-up scan reviewed showing right hydrocephalus. Per report, mildly enlarged right lateral ventricle but left ventricle and third ventricle and fourth ventricle were small. Noted generalized decline with unsteady gait noted bouts of aphasia. He was again readmitted 08/26/2018 for shunt revision ventricular peritoneal 08/26/2018 per Dr. Jordan Likes. Subtends heparin added for DVT prophylaxis 08/29/2018. He was admitted to inpatient rehab services 08/30/2018 with slow but progressive gains. Patient with generalized decline more somnolence 09/01/2018. A follow-up CT of the head showed stable appearance of ventriculomegaly with unchanged positioning of ventriculostomy catheter. Follow-up neurosurgery suspect VP malfunction. He was discharged to acute care services 09/02/2018 and again underwent shunt revision per Dr. Jordan Likes. Latest cranial CT scan 09/05/2018 showed nearly resolved hydrocephalus stable appearance of VP shunt. Patient with resulting functional deficits with gait, cognition, balance, and self-care. Will set goals for Supervision with PT/OT and Min A with SLP.  See Team Conference Notes for weekly updates to the plan of care

## 2018-09-10 NOTE — Progress Notes (Signed)
  Speech Language Pathology Treatment: Cognitive-Linquistic  Patient Details Name: Dominico Rod MRN: [id number] DOB: 06-Jun-1956 Today's Date: 09/10/2018 Time: 4098-1191 SLP Time Calculation (min) (ACUTE ONLY): 19 min  Assessment / Plan / Recommendation Clinical Impression  Pt presents with substantial improvements when compared to initial evaluation from being re-admitted to acute care for shunt revision post CIR.  Pt can follow 1 step commands with supervision and two step commands with min assist visual cues.  Caregivers should be aware that pt needs increased time for processing and visual cues when giving instructions to compensate for auditory comprehension deficits.  Pt was able to name basic familiar objects from around his room with min assist verbal cues for word finding.  Pt demonstrates significant improvements in his ability to recognize and correct paraphasic errors.  Reviewed strategies for maximizing pt's functional communication and reducing pt's frustration with pt's wife.  All questions were answered to their satisfaction at this time.  Pt remains an excellent CIR candidate.    HPI HPI: Pt is a 62 y/o male admitted from CIR secondary to decline in functional mobility and decreased arousal. Thought to be secondary to VP shunt malfunction. Pt is s/p shunt revision on 10/21. Recent history of VP shunt placement and revision. PMH includes anxiety, TIA, and LUE fx.       SLP Plan  Continue with current plan of care       Recommendations                   Follow up Recommendations: Inpatient Rehab SLP Visit Diagnosis: Aphasia (R47.01);Cognitive communication deficit (R41.841) Plan: Continue with current plan of care       GO                Keagen Heinlen, Melanee Spry 09/10/2018, 10:08 AM

## 2018-09-10 NOTE — Progress Notes (Signed)
Physical Therapy Treatment Patient Details Name: Charles Marquez MRN: [id number] DOB: 11-19-1955 Today's Date: 09/10/2018    History of Present Illness A 62 year old right-handed male with history of hyperlipidemia, CVA with loop recorder insertion 04/10/2017 as well as right occipital VP shunt placement in 2016.  Lives with spouse.  The patient with recent admission 08/04/2018 for unsteady gait, nausea, and vomiting.  Findings of malfunction of the VP shunt with revision 08/05/2018 discharge to home and readmitted 08/08/2018 to 08/22/2018 for VP shunt infection, Underwent removal of VP shunt with placement of external ventriculostomy. Coompleted 2 week antibiotic then DC home. Readmitted 08/26/18 to hospital due to decline and unsteady gait and aphasia. Underwent shunt revision and ventriculoperitoneal. DC to CIR for rehab. REadmitt4ed to acute care form CIR 09/02/2018 due to decline. Patient is s/p left proximal shunt revision on 09/02/2018.     PT Comments    Pt demonstrates both cognitive and motor function improvement. Pt initiating more conversation today and asking questions. Pt cont to have expressive aphasia and word finding difficulty. Pt with improved memory to about 30 sec compared to 5 sec yesterday. Pt with improved problem solving this date with max verbal cues. Pt able to find elevators and with cues figure out which button to press to go up/down. Pt cont to vear to the R during ambulation and runs into objects on the R hand side without self correction.  Cont to recommend CIR upon d/c for maximal functional and cognitive return.   Follow Up Recommendations  CIR     Equipment Recommendations  None recommended by PT    Recommendations for Other Services Rehab consult     Precautions / Restrictions Precautions Precautions: Fall Restrictions Weight Bearing Restrictions: No    Mobility  Bed Mobility               General bed mobility comments: pt up in  recliner  Transfers Overall transfer level: Needs assistance Equipment used: None Transfers: Sit to/from Stand Sit to Stand: Min guard         General transfer comment:  min guard for safety due to slighlty impulsive and quick  Ambulation/Gait Ambulation/Gait assistance: Min assist Gait Distance (Feet): 200 Feet(x2) Assistive device: None Gait Pattern/deviations: Decreased stride length;Step-through pattern;Staggering right Gait velocity: slow Gait velocity interpretation: <1.31 ft/sec, indicative of household ambulator General Gait Details: pt with shuffling about 25% of time but cont to constantly vear R and will run into objects/wall on the R and not self correct. Pt more unsteady on uneven terrian ie. ramp/gradient cement outside   Stairs Stairs: Yes Stairs assistance: Min guard Stair Management: One rail Right;Alternating pattern Number of Stairs: 12 General stair comments: slow and steady   Wheelchair Mobility    Modified Rankin (Stroke Patients Only)       Balance Overall balance assessment: Needs assistance         Standing balance support: No upper extremity supported Standing balance-Leahy Scale: Fair Standing balance comment: able to wash hands at sink with min guard                            Cognition Arousal/Alertness: Awake/alert Behavior During Therapy: Flat affect Overall Cognitive Status: Impaired/Different from baseline Area of Impairment: Orientation;Attention;Memory;Following commands;Safety/judgement;Awareness;Problem solving                 Orientation Level: Disoriented to;Place;Time;Situation Current Attention Level: Sustained Memory: Decreased short-term memory Following Commands: Follows one step commands  with increased time Safety/Judgement: Decreased awareness of deficits Awareness: Emergent(stated he needed to go to the bathroom) Problem Solving: Slow processing;Decreased initiation;Requires verbal  cues;Difficulty sequencing;Requires tactile cues General Comments: pt with improved problem solving with moderate verbal cues, ie. which button to press to go down on the elevator or how to find the exit to go outside      Exercises      General Comments General comments (skin integrity, edema, etc.): VSS      Pertinent Vitals/Pain Pain Assessment: No/denies pain    Home Living         Home Access: Stairs to enter Entrance Stairs-Rails: Can reach both Home Layout: Two level;1/2 bath on main level;Bed/bath upstairs Home Equipment: None Additional Comments: may need BSC    Prior Function    Gait / Transfers Assistance Needed: Per wife, was working with therapy services on ambulation with and without AD.  ADL's / Homemaking Assistance Needed: Required assist for ADLs in CIR.  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 : None Difficulty sitting down on and standing up from a chair with arms (e.g., wheelchair, bedside commode, etc,.)?: A Little Help needed moving to and from a bed to chair (including a wheelchair)?: A Little Help needed walking in hospital room?: A Little Help needed climbing 3-5 steps with a railing? : A Little 6 Click Score: 20    End of Session Equipment Utilized During Treatment: Gait belt Activity Tolerance: Patient tolerated treatment well Patient left: in chair;with call bell/phone within reach;with  family/visitor present Nurse Communication: Mobility status PT Visit Diagnosis: Unsteadiness on feet (R26.81);Difficulty in walking, not elsewhere classified (R26.2);Other symptoms and signs involving the nervous system (R29.898)     Time: 1610-9604 PT Time Calculation (min) (ACUTE ONLY): 32 min  Charges:  $Gait Training: 23-37 mins                     Lewis Shock, PT, DPT Acute Rehabilitation Services Pager #: [phone] Office #: [phone]    Iona Hansen 09/10/2018, 2:27 PM

## 2018-09-10 NOTE — Progress Notes (Signed)
Charles Gong, RN  Rehab Admission Coordinator  Physical Medicine and Rehabilitation  PMR Pre-admission  Signed  Date of Service:  09/10/2018 1:21 PM       Related encounter: Admission (Current) from 09/02/2018 in [place]         Show:Clear all _0 Manual_1 Template_2 Copied  Added by: _3 Charles Gong, RN  _4 Hover for details PMR Admission Coordinator Pre-Admission Assessment  Patient: Charles Marquez is an 62 y.o., male MRN: [id number] DOB: January 21, 1956 Height:   Weight: 80.6 kg                                                                                                                                                  Insurance Information HMO:     PPO: yes     PCP:      IPA:      80/20:      OTHER:  PRIMARY: [name]      Policy#: [id number]      Subscriber: pt CM Name: [name].      Phone#: [phone]     Fax#: [phone] Pre-Cert#: [id number] for 7 days. Use rehab tool form for concurrent reviews      Employer: [employer]; Laid off;  Benefits:  Phone #: [phone]     Name: 08/29/2018 Eff. Date: 11/13/2017     Deduct: $2000/met      Out of Pocket Max: $6750/met      Life Max: none CIR: Was not a covered benefit last admit to CIR and account executive gave approval; 0n 09/10/18, Katharine Look, CM says it is now a covered benefit; 80%      SNF: 80% Outpatient: 80 %     Co-Pay: 90 visits combined Home Health: 80%      Co-Pay: 90 visits combined DME: 80%     Co-Pay: 20% Providers: in network  SECONDARY: none       Medicaid Application Date:       Case Manager:  Disability Application Date:       Case Worker:   Emergency Publishing copy Information    Name Relation Home Work [place] Spouse [phone]  [phone]   [place] Daughter [phone]  [phone]     Current Medical History  Patient Admitting Diagnosis: VP shunt revision  History of  Present Illness:Charles Marquez a 62 year old right-handed male with history of hyperlipidemia, 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  Patient with recent admission 08/04/2018 for unsteadiness of gait and nausea vomiting findings of malfunction of VP shunt with revision completed 08/05/2018 and discharged home. He was readmitted 08/08/2018 to 08/22/2018 for VP shunt infection organism isolated Klebsiella. He again underwent removal of  VP shunt with placement of external ventriculostomy and completed a 2-week course of IV antibiotics. Follow-up scan reviewed showing right hydrocephalus. Per report, mildly enlarged right lateral ventricle but left ventricle and third ventricle and fourth ventricle were small. Noted generalized decline with unsteady gait noted bouts of aphasia. He was again readmitted 08/26/2018 for shunt revision ventricular peritoneal 08/26/2018 per Dr. Annette Stable. Subtends heparin added for DVT prophylaxis 08/29/2018. He was admitted to inpatient rehab services 08/30/2018 with slow but progressive gains. Patient with generalized decline more somnolence 09/01/2018. A follow-up CT of the head showed stable appearance of ventriculomegaly with unchanged positioning of ventriculostomy catheter. Follow-up neurosurgery suspect VP malfunction. He was discharged to acute care services 09/02/2018 and again underwent shunt revision per Dr. Annette Stable. Latest cranial CT scan 09/05/2018 showed nearly resolved hydrocephalus stable appearance of VP shunt.   Past Medical History      Past Medical History:  Diagnosis Date  . Anxiety   . Hypercholesteremia   . Stroke ([place])    tia's  . TIA (transient ischemic attack)    09.15    Family History  family history includes Alzheimer's disease in his father; COPD in his mother; Lung cancer in his father.  Prior Rehab/Hospitalizations:  Has the patient had major surgery during 100  days prior to admission? Yes CIR 10/18 until 10/21 when readmitted to acute  Current Medications   Current Facility-Administered Medications:  .  acetaminophen (TYLENOL) tablet 650 mg, 650 mg, Oral, Q4H PRN **OR** acetaminophen (TYLENOL) suppository 650 mg, 650 mg, Rectal, Q4H PRN, Earnie Larsson, MD .  acetaminophen (TYLENOL) tablet 500-1,000 mg, 500-1,000 mg, Oral, Q6H PRN, Earnie Larsson, MD .  atorvastatin (LIPITOR) tablet 40 mg, 40 mg, Oral, Daily, Pool, Mallie Mussel, MD, 40 mg at 09/10/18 0914 .  bisacodyl (DULCOLAX) EC tablet 5 mg, 5 mg, Oral, Daily PRN, Reinaldo Meeker, Meghan D, NP, 5 mg at 09/03/18 1151 .  bisacodyl (DULCOLAX) suppository 10 mg, 10 mg, Rectal, Daily PRN, Earnie Larsson, MD .  docusate sodium (COLACE) capsule 100 mg, 100 mg, Oral, BID, Earnie Larsson, MD, 100 mg at 09/10/18 0914 .  famotidine (PEPCID) tablet 20 mg, 20 mg, Oral, BID, Bergman, Meghan D, NP, 20 mg at 09/10/18 0914 .  feeding supplement (ENSURE ENLIVE) (ENSURE ENLIVE) liquid 237 mL, 237 mL, Oral, BID BM, Earnie Larsson, MD, 237 mL at 09/10/18 0914 .  HYDROcodone-acetaminophen (NORCO/VICODIN) 5-325 MG per tablet 1 tablet, 1 tablet, Oral, Q4H PRN, Earnie Larsson, MD .  HYDROmorphone (DILAUDID) injection 0.5-1 mg, 0.5-1 mg, Intravenous, Q2H PRN, Earnie Larsson, MD .  labetalol (NORMODYNE,TRANDATE) injection 10-40 mg, 10-40 mg, Intravenous, Q10 min PRN, Earnie Larsson, MD .  magic mouthwash, 5 mL, Oral, QID PRN, Earnie Larsson, MD, 5 mL at 09/06/18 1102 .  multivitamin (PROSIGHT) tablet 1 tablet, 1 tablet, Oral, Daily, Pool, Mallie Mussel, MD, 1 tablet at 09/10/18 0914 .  naloxone [facility]) injection 0.08 mg, 0.08 mg, Intravenous, PRN, Earnie Larsson, MD .  ondansetron (ZOFRAN) tablet 4 mg, 4 mg, Oral, Q4H PRN **OR** ondansetron (ZOFRAN) injection 4 mg, 4 mg, Intravenous, Q4H PRN, Earnie Larsson, MD .  ondansetron (ZOFRAN) tablet 4 mg, 4 mg, Oral, QID PRN, Earnie Larsson, MD .  polyethylene glycol (MIRALAX / GLYCOLAX) packet 17 g, 17 g, Oral, Daily PRN, Earnie Larsson,  MD .  promethazine (PHENERGAN) tablet 12.5-25 mg, 12.5-25 mg, Oral, Q4H PRN, Earnie Larsson, MD .  sodium chloride (OCEAN) 0.65 % nasal spray 1 spray, 1 spray, Each Nare, PRN, Earnie Larsson, MD, 1 spray at 09/09/18  1133 .  sodium phosphate (FLEET) 7-19 GM/118ML enema 1 enema, 1 enema, Rectal, Once PRN, Earnie Larsson, MD .  traMADol Veatrice Bourbon) tablet 50 mg, 50 mg, Oral, QHS PRN, Earnie Larsson, MD  Patients Current Diet:     Diet Order                  Diet Carb Modified Fluid consistency: Thin; Room service appropriate? Yes  Diet effective now               Precautions / Restrictions Precautions Precautions: Fall Precaution Comments: extremely short memory, 5 sec (does better if he repeats what he is asked to do) Restrictions Weight Bearing Restrictions: No   Has the patient had 2 or more falls or a fall with injury in the past year?No  Prior Activity Level Limited Community (1-2x/wk): laid off job with [employer]; was to begin contract work  Development worker, international aid / Paramedic Devices/Equipment: [place]: None  Prior Device Use: Indicate devices/aids used by the patient prior to current illness, exacerbation or injury? None of the above  Prior Functional Level Prior Function Level of Independence: Needs assistance Gait / Transfers Assistance Needed: Per wife, was working with therapy services on ambulation with and without AD.  ADL's / Homemaking Assistance Needed: Required assist for ADLs in CIR.  Communication / Swallowing Assistance Needed: receptive and expressive communication deficits Comments: works in Engineer, technical sales for [employer]; umpires for baseball games, went home w/ wife before most recent revision, was ambulating w/o AD  Self Care: Did the patient need help bathing, dressing, using the toilet or eating?  Independent  Indoor Mobility: Did the patient need assistance with walking from room to room (with or without device)?  Independent  Stairs: Did the patient need assistance with internal or external stairs (with or without device)? Independent  Functional Cognition: Did the patient need help planning regular tasks such as shopping or remembering to take medications? Independent  Current Functional Level Cognition  Arousal/Alertness: Awake/alert Overall Cognitive Status: Impaired/Different from baseline Current Attention Level: Focused Orientation Level: Oriented to person, Oriented to place, Disoriented to situation, Disoriented to time Following Commands: Follows one step commands inconsistently, Follows multi-step commands inconsistently Safety/Judgement: Decreased awareness of safety, Decreased awareness of deficits General Comments: Pt able to follow step by step commands if given directions on the go, but if you give him a direction and then say something else to him he forgets what he is doing. He did much better if not distracted and if he repeated what he was to do after told what to do. He had great difficulty with telling colors, letters, and finding items asked of him on bulletin board. He would often perserverate on one thing (color, letter). Wife is a Oncologist and so we talked about flash cards with numbers/letters/colors and that if pt perseverates to stop the task and rest or move onto another activity. Also to work on giving him 2 choices and having him pick the one she askes him for. We also discussed her helping him to journal each of his activites during the day so he can look back at them to see what he had done that day. Attention: Sustained Sustained Attention: Impaired Sustained Attention Impairment: Functional basic, Verbal basic Memory: Impaired Awareness: Impaired Awareness Impairment: Intellectual impairment Problem Solving: Impaired Problem Solving Impairment: Verbal basic, Functional basic Executive Function: (all areas impacted by lower level  deficits) Safety/Judgment: Impaired Comments: decreased awareness of deficits  and decreased safety awareness    Extremity Assessment (includes Sensation/Coordination)  Upper Extremity Assessment: RUE deficits/detail RUE Deficits / Details: improving functional use of RUE during ADL tasks; using spontaneously RUE Coordination: decreased fine motor, decreased gross motor  Lower Extremity Assessment: Defer to PT evaluation RLE Deficits / Details: RLE weakness noted functionally and noted decreased coordination functionall.  RLE Coordination: decreased gross motor, decreased fine motor    ADLs  Overall ADL's : Needs assistance/impaired Eating/Feeding: Moderate assistance Eating/Feeding Details (indicate cue type and reason): family reoprts pt doing better with finger foods; states he sis doing better managing utensils Grooming: Minimal assistance Grooming Details (indicate cue type and reason): Pt with improved ability to sequence functional tasks; able to put on toothpaste adn brush although brushed teeth for approximately2 seoncds before spitting out toothpaste. Pt distracted by grey emesis basin on sink and began filling basin with water instead of spitting into sink.  Upper Body Bathing: Maximal assistance, Sitting Lower Body Bathing: Moderate assistance, Sit to/from stand Upper Body Dressing : Moderate assistance, Sitting Lower Body Dressing: Moderate assistance, Sit to/from stand Toilet Transfer: Minimal assistance Toileting- Clothing Manipulation and Hygiene: Minimal assistance Toileting - Clothing Manipulation Details (indicate cue type and reason): does not appear to assiciate sittin gon toilet with trying to use the bathroom; pt appeared to be perseverating on brushing teeth Functional mobility during ADLs: Minimal assistance General ADL Comments: Wife reported she and son A'd pt with shower over the weekend with staff S. She reports having to tell him almost every step of what he  needed to do, but pt did bath. She also reported that he dressed himself once each item was presented with A for standing. We talked about him doing his bath and she trying not to cue him as well as handing him all of clothes and asking him to dress--to then see how he does with more of an "open" task.     Mobility  Overal bed mobility: Needs Assistance Bed Mobility: Supine to Sit Supine to sit: Min assist General bed mobility comments: pt up in recliner upon arrival    Transfers  Overall transfer level: Needs assistance Equipment used: 1 person hand held assist Transfers: Sit to/from Stand Sit to Stand: Min assist Stand pivot transfers: Mod assist General transfer comment: pt unsteady upon standing with posterior lean and staggering stepping with feet. Pt with good technique to return to sitting    Ambulation / Gait / Stairs / Wheelchair Mobility  Ambulation/Gait Ambulation/Gait assistance: Herbalist (Feet): 150 Feet(with one seated rest break) Assistive device: 1 person hand held assist Gait Pattern/deviations: Step-through pattern, Decreased stride length, Shuffle, Staggering left, Staggering right General Gait Details: pt shuffling/inability to clear feet 50% of time requiring minA to prevent fall forward. pt constantly looking to wife for guidance on what to do and where to go. Pt able to follow immeadiate directional commands however not multistep commands Gait velocity: slow Gait velocity interpretation: <1.8 ft/sec, indicate of risk for recurrent falls    Posture / Balance Dynamic Sitting Balance Sitting balance - Comments: able to don shoes on while sitting EOB Balance Overall balance assessment: Needs assistance Sitting-balance support: Feet supported, No upper extremity supported Sitting balance-Leahy Scale: Good Sitting balance - Comments: able to don shoes on while sitting EOB Postural control: Posterior lean Standing balance support: No upper  extremity supported Standing balance-Leahy Scale: Poor Standing balance comment: statically he does well (fair), dynamically he needs external support Standardized Balance Assessment Standardized  Balance Assessment : Berg Balance Test Berg Balance Test Sit to Stand: Able to stand  independently using hands Standing Unsupported: Able to stand 30 seconds unsupported Sitting with Back Unsupported but Feet Supported on Floor or Stool: Able to sit 2 minutes under supervision Stand to Sit: Controls descent by using hands Transfers: Able to transfer with verbal cueing and /or supervision From Standing Position, Pick up Object from Floor: Unable to pick up and needs supervision From Standing Position, Turn to Look Behind Over each Shoulder: Looks behind one side only/other side shows less weight shift Turn 360 Degrees: Able to turn 360 degrees safely but slowly Dynamic Gait Index Level Surface: Mild Impairment Change in Gait Speed: Moderate Impairment Gait with Horizontal Head Turns: Moderate Impairment Gait with Vertical Head Turns: Mild Impairment    Special needs/care consideration BiPAP/CPAP n/a CPM n/a Continuous Drip IV n/a Dialysis n/a Life Vest n/a Oxygen n/a Special Bed n/a Trach Size n/a Wound Vac n/a Skin surgical incision  Bowel mgmt: continent LBM 10/28 Bladder mgmt: come incontinence with use of external catheter Diabetic mgmt n/a   Previous Home Environment Living Arrangements: Spouse/significant other  Lives With: Spouse Available Help at Discharge: Family, Available 24 hours/day Type of Home: House Home Layout: Two level, 1/2 bath on main level, Bed/bath upstairs Alternate Level Stairs-Rails: Left Alternate Level Stairs-Number of Steps: flight  Home Access: Stairs to enter Entrance Stairs-Rails: Can reach both Entrance Stairs-Number of Steps: 5 Bathroom Shower/Tub: Tub/shower unit, Multimedia programmer: Standard Bathroom Accessibility: Yes How  Accessible: Accessible via walker Home Care Services: No Additional Comments: may need [facility]  Discharge Living Setting Plans for Discharge Living Setting: Patient's home, Lives with (comment) Type of Home at Discharge: House Discharge Home Layout: Two level, 1/2 bath on main level, Bed/bath upstairs Alternate Level Stairs-Rails: Left Alternate Level Stairs-Number of Steps: flight Discharge Home Access: Stairs to enter Entrance Stairs-Rails: Right, Left, Can reach both Entrance Stairs-Number of Steps: 5 Discharge Bathroom Shower/Tub: Tub/shower unit, Walk-in shower Discharge Bathroom Toilet: Standard Discharge Bathroom Accessibility: Yes How Accessible: Accessible via walker Does the patient have any problems obtaining your medications?: No  Social/Family/Support Systems Patient Roles: Spouse, Parent, Other (Comment) Contact Information: wife, [name] Anticipated Caregiver: Wife Anticipated Caregiver's Contact Information: see above Ability/Limitations of Caregiver: Wife is taking a FMLA from school Caregiver Availability: 24/7 Discharge Plan Discussed with Primary Caregiver: Yes Is Caregiver In Agreement with Plan?: Yes Does Caregiver/Family have Issues with Lodging/Transportation while Pt is in Rehab?: No  Goals/Additional Needs Patient/Family Goal for Rehab: supervision PT, OT, and SLP Expected length of stay: ELOS 7 days Pt/Family Agrees to Admission and willing to participate: Yes Program Orientation Provided & Reviewed with Pt/Caregiver Including Roles  & Responsibilities: Yes  Decrease burden of Care through IP rehab admission: n/a   Possible need for SNF placement upon discharge: not anticipated  Patient Condition: This patient's medical and functional status has changed since the consult dated: 08/28/2018 as well as admit to AIR 10/18 in which the Rehabilitation Physician determined and documented that the patient's condition is appropriate for intensive  rehabilitative care in an inpatient rehabilitation facility. See "History of Present Illness" (above) for medical update. Functional changes are: overall min assist. Patient's medical and functional status update has been discussed with the Rehabilitation physician and patient remains appropriate for readmission to  inpatient rehabilitation. Will admit to inpatient rehab today.  Preadmission Screen Completed By:  Cleatrice Burke, 09/10/2018 1:22 PM ______________________________________________________________________   Discussed status with Dr. Naaman Plummer  on 09/10/2018 at  1331 and received telephone approval for admission today.  Admission Coordinator:  Cleatrice Burke, time 2800 Date 09/10/2018           Cosigned by: Meredith Staggers, MD at 09/10/2018 3:47 PM  Revision History

## 2018-09-10 NOTE — Progress Notes (Signed)
Resident arrived to unit at 4:45 via family and belongings. Resident able to ambulate with assistance. Oriented to room and unit. Call bell within reach. Will continue to monitor.

## 2018-09-10 NOTE — Progress Notes (Addendum)
Nutrition Follow Up  DOCUMENTATION CODES:   Not applicable  INTERVENTION:  Continue Ensure Enlive po BID, each supplement provides 350 kcal and 20 grams of protein.  Encourage adequate PO intake.   NUTRITION DIAGNOSIS:   Increased nutrient needs related to post-op healing as evidenced by estimated needs; ongoing  GOAL:   Patient will meet greater than or equal to 90% of their needs; met  MONITOR:   PO intake, Supplement acceptance, Labs, Weight trends, I & O's, Skin  REASON FOR ASSESSMENT:   Malnutrition Screening Tool    ASSESSMENT:   62 year old right-handed male with history of hyperlipidemia, 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 Pt with generalized decline with unstable gait noted bouts of aphasia.  Pt readmitted 08/26/2018 for shunt revision ventricular peritoneal. In CIR, pt with worsening level of consciousness and enlarging right lateral ventricle. Patient presents now for shunt revision.  10/21 Shunt revision L ventricular peritoneal.  Meal completion has been 100%. Wife has been encouraging pt po intake. Pt currently has Ensure ordered and has been consuming them. RD to continue with current orders to aid in increased caloric and protein needs. Plans to discharge to CIR today. Labs and medications reviewed.   Diet Order:   Diet Order            Diet Carb Modified Fluid consistency: Thin; Room service appropriate? Yes  Diet effective now              EDUCATION NEEDS:   Not appropriate for education at this time  Skin:  Skin Assessment: Skin Integrity Issues: Skin Integrity Issues:: Incisions Incisions: head  Last BM:  10/28  Height:   Ht Readings from Last 1 Encounters:  08/30/18 _0  (1.803 m)    Weight:   Wt Readings from Last 1 Encounters:  09/03/18 80.6 kg    Ideal Body Weight:  78 kg  BMI:  Body mass index is 24.78 kg/m.  Estimated Nutritional Needs:   Kcal:   2100-2300  Protein:  110-125 grams  Fluid:  >/= 2.1 L/day    Corrin Parker, MS, RD, LDN Pager # [phone] After hours/ weekend pager # [phone]

## 2018-09-10 NOTE — Progress Notes (Signed)
Patel, Ankit Anil, MD  Physician  Physical Medicine and Rehabilitation  Consult Note  Signed  Date of Service:  08/28/2018 11:35 AM       Related encounter: ED to Hosp-Admission (Discharged) from 08/25/2018 in [facility]      Signed      Expand All Collapse All    Show:Clear all [x]Manual[x]Template[]Copied  Added by: [x]Angiulli, Daniel J, PA-C[x]Patel, Ankit Anil, MD  []Hover for details      Physical Medicine and Rehabilitation Consult Reason for Consult: Decreased functional mobility Referring Physician: Dr. Pool   HPI: Taran Edward Crisp is a 62 y.o. right-handed male with history of hyperlipidemia, 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  Per chart review and wife, patient lives with spouse.  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  Patient ambulating without assistive device after recent discharge.  2 level home 5 steps to entry.  Patient with recent admission 08/04/2018 for unsteadiness of gait with nausea vomiting findings of malfunction of VP shunt with revision completed 08/05/2018 and discharged to home.  He was readmitted 08/08/2018 to 08/22/2018 for VP shunt infection as well as decrease in functional mobility, right side weakness and new onset of aphasia.  Organism isolated was Klebsiella.  He again underwent removal of VP shunt with placement of external ventriculostomy and completed a 2-week course of IV antibiotics.  Follow-up scan reviewed, showing right hydrocephalus. Per report, mildly enlarged right lateral ventricle but left ventricle and third ventricle and fourth ventricle small.  He was again readmitted 08/26/2018 for shunt revision ventricular peritoneal 08/26/2018 per Dr. Pool.  Physical therapy evaluation completed 08/27/2018 with recommendations of physical medicine rehab consult.  Review of Systems  Unable to perform ROS: Language       Past Medical History:  Diagnosis  Date  . Anxiety   . Hypercholesteremia   . Stroke (HCC)    tia's  . TIA (transient ischemic attack)    09.15        Past Surgical History:  Procedure Laterality Date  . Fractured arm Left 12  . HERNIA REPAIR Right 3/12  . LAPAROSCOPIC REVISION VENTRICULAR-PERITONEAL (V-P) SHUNT N/A 08/26/2018   Procedure: LAPAROSCOPIC INSERTION VENTRICULAR-PERITONEAL (V-P) SHUNT;  Surgeon: Pool, Henry, MD;  Location: [facility] OR;  Service: Neurosurgery;  Laterality: N/A;  . LOOP RECORDER INSERTION N/A 04/10/2017   Procedure: Loop Recorder Insertion;  Surgeon: Allred, James, MD;  Location: [facility];  Service: Cardiovascular;  Laterality: N/A;  . SHUNT REMOVAL Right 03/13/2016   Procedure: SHUNT REMOVAL;  Surgeon: Henry Pool, MD;  Location: [facility];  Service: Neurosurgery;  Laterality: Right;  . SHUNT REMOVAL Right 08/09/2018   Procedure: SHUNT REMOVAL With Placement of Ventricular Catheter;  Surgeon: Nundkumar, Neelesh, MD;  Location: [facility] OR;  Service: Neurosurgery;  Laterality: Right;  . SHUNT REVISION Right 08/05/2018   Procedure: SHUNT REVISION;  Surgeon: Pool, Henry, MD;  Location: [facility];  Service: Neurosurgery;  Laterality: Right;  . SHUNT REVISION VENTRICULAR-PERITONEAL Left 08/26/2018   Procedure: SHUNT REVISION VENTRICULAR-PERITONEAL;  Surgeon: Pool, Henry, MD;  Location: [facility];  Service: Neurosurgery;  Laterality: Left;  . VASECTOMY  10/02/1997  . VENTRICULOPERITONEAL SHUNT Right 12/18/2014   Procedure: Shunt Placment - right occipital VP shunt ;  Surgeon: Henry A Pool, MD;  Location: [facility];  Service: Neurosurgery;  Laterality: Right;  Shunt Placment - right occipital VP shunt   . VENTRICULOPERITONEAL SHUNT Right 07/22/2018   Procedure:   Shunt Placment right occipital;  Surgeon: Pool, Henry, MD;  Location: [facility];  Service: Neurosurgery;  Laterality: Right;  . VENTRICULOPERITONEAL SHUNT Left 08/26/2018   Procedure: LEFT SIDED VENTRICULAR-PERITONEAL SHUNT;  Surgeon: Pool,  Henry, MD;  Location: [facility];  Service: Neurosurgery;  Laterality: Left;  . VENTRICULOSTOMY Right 08/09/2018   Procedure: VENTRICULOSTOMY;  Surgeon: Nundkumar, Neelesh, MD;  Location: [facility];  Service: Neurosurgery;  Laterality: Right;        Family History  Problem Relation Age of Onset  . COPD Mother   . Lung cancer Father   . Alzheimer's disease Father    Social History:  reports that he has never smoked. He has never used smokeless tobacco. He reports that he drinks alcohol. He reports that he does not use drugs. Allergies: No Known Allergies       Medications Prior to Admission  Medication Sig Dispense Refill  . acetaminophen (TYLENOL) 500 MG tablet Take 500-1,000 mg by mouth every 6 (six) hours as needed for headache (pain).    . Artificial Tear Ointment (DRY EYES OP) Apply 1 drop to eye daily as needed (for drye eyes).    . atorvastatin (LIPITOR) 40 MG tablet Take 1 tablet (40 mg total) by mouth daily. 90 tablet 3  . Multiple Vitamins-Minerals (OCUVITE EYE HEALTH FORMULA) CAPS Take 1 capsule by mouth daily.    . ondansetron (ZOFRAN) 4 MG tablet Take 4 mg by mouth 4 (four) times daily as needed for nausea or vomiting.    . sodium chloride (OCEAN) 0.65 % SOLN nasal spray Place 1 spray into both nostrils as needed for congestion.    . traMADol (ULTRAM) 50 MG tablet Take 1 tablet (50 mg total) by mouth every 6 (six) hours as needed. (Patient taking differently: Take 50 mg by mouth at bedtime as needed (pain). ) 30 tablet 1    Home: Home Living Family/patient expects to be discharged to:: Private residence Living Arrangements: Spouse/significant other Available Help at Discharge: Family, Available 24 hours/day Type of Home: House Home Access: Stairs to enter Entrance Stairs-Number of Steps: 5 Entrance Stairs-Rails: Can reach both Home Layout: Two level Alternate Level Stairs-Number of Steps: flight  Alternate Level Stairs-Rails: Left Bathroom Shower/Tub:  Tub/shower unit, Walk-in shower Bathroom Toilet: Standard Bathroom Accessibility: Yes Home Equipment: None  Lives With: Spouse  Functional History: Prior Function Level of Independence: Independent Comments: 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 went home with wife at discharge 4 days PTA and was ambulating without an AD.  Functional Status:  Mobility: Bed Mobility Overal bed mobility: Needs Assistance Bed Mobility: Supine to Sit Supine to sit: Mod assist, HOB elevated General bed mobility comments: Mod assist to help support trunk and initiate movement of legs to side of bed.  Transfers Overall transfer level: Needs assistance Equipment used: 2 person hand held assist Transfers: Sit to/from Stand, Stand Pivot Transfers Sit to Stand: Mod assist, +2 physical assistance Stand pivot transfers: +2 physical assistance, Mod assist General transfer comment: Two person mod assist to stand to BSC and stand multiple times from BSC switching out the BSC pulling up the recliner chair once he tried to have a BM.   Ambulation/Gait General Gait Details: NT today as significant time was spent trying to get to BSC and into recliner.  Will likely need to start with two person hand held assist vs two people and the RW.  ADL:  Cognition: Cognition Overall Cognitive Status: Impaired/Different from baseline Arousal/Alertness: Awake/alert Orientation Level:   Other (comment)(dysphagia) Cognition Arousal/Alertness: Awake/alert Behavior During Therapy: Restless Overall Cognitive Status: Impaired/Different from baseline Area of Impairment: Orientation, Attention, Memory, Following commands, Safety/judgement, Awareness, Problem solving Orientation Level: Disoriented to, Place, Time, Situation Current Attention Level: Focused Memory: Decreased recall of precautions, Decreased short-term memory Following Commands: Follows one step commands with increased time, Follows one step commands  inconsistently(25% of commands) Safety/Judgement: Decreased awareness of safety, Decreased awareness of deficits Awareness: Intellectual Problem Solving: Slow processing, Decreased initiation, Difficulty sequencing, Requires verbal cues, Requires tactile cues General Comments: Pt with significant decline in cognition compared to his previous admission, difficulty with processing, initiation, global aphasia, impulsive and restless.  Blood pressure (!) 133/91, pulse 100, temperature 98.9 F (37.2 C), temperature source Oral, resp. rate 16, height 5' 11" (1.803 m), weight 81.2 kg, SpO2 99 %. Physical Exam  Vitals reviewed. Constitutional: He appears well-developed and well-nourished.  HENT:  Head: Normocephalic.  VP shunt site clean and dry  Eyes: EOM are normal. Right eye exhibits no discharge. Left eye exhibits no discharge.  Pupils reactive to light  Neck: Normal range of motion. Neck supple. No thyromegaly present.  Cardiovascular: Normal rate and regular rhythm.  Respiratory: Effort normal and breath sounds normal. No respiratory distress.  GI: Soft. Bowel sounds are normal. He exhibits no distension.  Musculoskeletal:  No edema or tenderness in extremities  Neurological: He is alert.  A&Ox0 Patient is aphasic with some spontaneous speech.   He did follow basic commands. Motor: RUE: 3+/5 proximal to distal LUE: 4-/5 proximal to distal RLE: 4/5 proximal to distal LLE: 4+/5 proximal to distal   Skin: Skin is warm and dry.  See above  Psychiatric: His affect is blunt. His speech is delayed. He is slowed.    LabResultsLast24Hours  No results found for this or any previous visit (from the past 24 hour(s)).    ImagingResults(Last48hours)  Ct Head Wo Contrast  Result Date: 08/27/2018 CLINICAL DATA:  Noncommunicating hydrocephalus. Status post VP shunt placement. EXAM: CT HEAD WITHOUT CONTRAST TECHNIQUE: Contiguous axial images were obtained from the base of the  skull through the vertex without intravenous contrast. COMPARISON:  08/26/2018 FINDINGS: Brain: The left parietal approach ventriculostomy catheter has been revised and now courses through the atrium of the left lateral ventricle, crosses the midline, and terminates in the body of the right lateral ventricle. Dilatation of the lateral and third ventricles has mildly decreased. There is new partly linear parenchymal hypoattenuation extending from the left temporoparietal junction anteriorly into the left internal capsule region with gas along this tract which may be related to catheter placement. Periventricular white matter hypoattenuation is similar to the prior study and is compatible transependymal CSF flow. Calcification is again noted in the right parietal lobe deep to a burr hole. No acute intracranial hemorrhage or acute large territory infarct is identified. Vascular: Mild calcified atherosclerosis at the skull base. No hyperdense vessel. Skull: Scalp soft tissue swelling and gas related to VP shunt placement. No acute fracture. Sinuses/Orbits: Paranasal sinuses and mastoid air cells are clear. Unremarkable orbits. Other: None. IMPRESSION: 1. Interval revision of ventriculostomy catheter with mildly improved lateral and third ventriculomegaly. Persistent periventricular hypoattenuation compatible with transependymal CSF flow. 2. Edema and gas extending into the left internal capsule region which may be related to catheter placement. Electronically Signed   By: Allen  Grady M.D.   On: 08/27/2018 10:08   Ct Head Wo Contrast  Result Date: 08/26/2018 CLINICAL DATA:  Hydrocephalus, noncommunicating EXAM: CT HEAD WITHOUT CONTRAST TECHNIQUE: Contiguous axial   images were obtained from the base of the skull through the vertex without intravenous contrast. COMPARISON:  Yesterday FINDINGS: Brain: VP shunt with a ventriculostomy catheter taking unexpected course through the parenchyma of the left parietal and  temporal lobes, tip at the left middle cranial fossa along the superficial temporal lobe. There is pneumocephalus attributed to catheter placement. Continued marked lateral and third ventriculomegaly consistent with hydrocephalus at the level of the cerebral aqua duct. There is transependymal CSF flow which appears similar to prior. No herniation or infarct seen. No acute hemorrhage. There is sequela of prior right parietal hemorrhage. Vascular: No hyperdense vessel or unexpected calcification. Skull: Gas and swelling related to recent shunt placement. Sinuses/Orbits: Negative 08/26/2018 at 4:30 pm. Dr. Pool is currently operating. I relayed the message to his MA Darisha who is relaying the message to him in the OR currently. IMPRESSION: 1. VP shunt placement with unexpected course of the ventriculostomy catheter traversing the left cerebral parenchyma and not reaching the ventricular system. 2. Unchanged marked lateral and third ventriculomegaly above cerebral aqua duct obstruction, with transependymal CSF flow. Electronically Signed   By: Jonathon  Watts M.D.   On: 08/26/2018 16:32     Assessment/Plan: Diagnosis: VP shunt malfunction/infection Labs and images (see above) independently reviewed.  Records reviewed and summated above.  1. Does the need for close, 24 hr/day medical supervision in concert with the patient's rehab needs make it unreasonable for this patient to be served in a less intensive setting? Yes  2. Co-Morbidities requiring supervision/potential complications: hyperlipidemia (cont meds), hx of CVA, HTN (monitor and provide prns in accordance with increased physical exertion and pain), Tachycardia (monitor in accordance with pain and increasing activity), ID (D/c IV Vanc when appropriate), leukocytosis (cont to monitor for signs and symptoms of infection, further workup if indicated), hyponatremia (cont to monitor, treat if necessary) 3. Due to bladder management, bowel management,  skin/wound care, disease management, medication administration and patient education, does the patient require 24 hr/day rehab nursing? Yes 4. Does the patient require coordinated care of a physician, rehab nurse, PT (1-2 hrs/day, 5 days/week), OT (1-2 hrs/day, 5 days/week) and SLP (1-2 hrs/day, 5 days/week) to address physical and functional deficits in the context of the above medical diagnosis(es)? Yes Addressing deficits in the following areas: balance, endurance, locomotion, strength, transferring, bathing, dressing, toileting, cognition, speech, language and psychosocial support 5. Can the patient actively participate in an intensive therapy program of at least 3 hrs of therapy per day at least 5 days per week? Yes 6. The potential for patient to make measurable gains while on inpatient rehab is excellent 7. Anticipated functional outcomes upon discharge from inpatient rehab are supervision  with PT, supervision with OT, supervision with SLP. 8. Estimated rehab length of stay to reach the above functional goals is: 8-12 days. 9. Anticipated D/C setting: Home 10. Anticipated post D/C treatments: HH therapy and Home excercise program 11. Overall Rehab/Functional Prognosis: excellent and good  RECOMMENDATIONS: This patient's condition is appropriate for continued rehabilitative care in the following setting: CIR when medically stable Patient has agreed to participate in recommended program. Potentially Note that insurance prior authorization may be required for reimbursement for recommended care.  Comment: Rehab Admissions Coordinator to follow up.   I have personally performed a face to face diagnostic evaluation, including, but not limited to relevant history and physical exam findings, of this patient and developed relevant assessment and plan.  Additionally, I have reviewed and concur with the physician assistant's documentation above.     Ankit Patel, MD, ABPMR Daniel J Angiulli,  PA-C 08/28/2018        Revision History                        Routing History               

## 2018-09-10 NOTE — H&P (Signed)
Physical Medicine and Rehabilitation Admission H&P       HPI: Charles Marquez is a 62 year old right-handed male with history of hyperlipidemia, 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  Per chart review and wife, patient lives with spouse.  He was working on Consulting civil engineer for [employer].  Patient ambulating without assistive device after a recent discharge.  Two-level home 5 steps to entry.  Patient with recent admission 08/04/2018 for unsteadiness of gait and nausea vomiting findings of malfunction of VP shunt with revision completed 08/05/2018 and discharged home.  He was readmitted 08/08/2018 to 08/22/2018 for VP shunt infection organism isolated Klebsiella.  He again underwent removal of VP shunt with placement of external ventriculostomy and completed a 2-week course of IV antibiotics.  Follow-up scan reviewed showing right hydrocephalus.  Per report, mildly enlarged right lateral ventricle but left ventricle and third ventricle and fourth ventricle were small.  Noted generalized decline with unsteady gait noted bouts of aphasia.  He was again readmitted 08/26/2018 for shunt revision ventricular peritoneal 08/26/2018 per Dr. Jordan Likes.  Subtends heparin added for DVT prophylaxis 08/29/2018.  He was admitted to inpatient rehab services 08/30/2018 with slow but progressive gains.  Patient with generalized decline more somnolence 09/01/2018.  A follow-up CT of the head showed stable appearance of ventriculomegaly with unchanged positioning of ventriculostomy catheter.  Follow-up neurosurgery suspect VP malfunction.  He was discharged to acute care services 09/02/2018 and again underwent shunt revision per Dr. Jordan Likes.  Latest cranial CT scan 09/05/2018 showed nearly resolved hydrocephalus stable appearance of VP shunt.  Therapies again resumed with progressive gains and patient was readmitted to inpatient rehab services to continue comprehensive rehabilitation.     Review of Systems  Unable to perform ROS: Language        Past Medical History:  Diagnosis Date  . Anxiety    . Hypercholesteremia    . Stroke (HCC)      tia's  . TIA (transient ischemic attack)      09.15         Past Surgical History:  Procedure Laterality Date  . Fractured arm Left 12  . HERNIA REPAIR Right 3/12  . LAPAROSCOPIC REVISION VENTRICULAR-PERITONEAL (V-P) SHUNT N/A 08/26/2018    Procedure: LAPAROSCOPIC INSERTION VENTRICULAR-PERITONEAL (V-P) SHUNT;  Surgeon: Julio Sicks, MD;  Location: [facility] OR;  Service: Neurosurgery;  Laterality: N/A;  . LOOP RECORDER INSERTION N/A 04/10/2017    Procedure: Loop Recorder Insertion;  Surgeon: Hillis Range, MD;  Location: [facility] INVASIVE CV LAB;  Service: Cardiovascular;  Laterality: N/A;  . SHUNT REMOVAL Right 03/13/2016    Procedure: SHUNT REMOVAL;  Surgeon: Julio Sicks, MD;  Location: [facility] NEURO ORS;  Service: Neurosurgery;  Laterality: Right;  . SHUNT REMOVAL Right 08/09/2018    Procedure: SHUNT REMOVAL With Placement of Ventricular Catheter;  Surgeon: Lisbeth Renshaw, MD;  Location: [facility] OR;  Service: Neurosurgery;  Laterality: Right;  . SHUNT REVISION Right 08/05/2018    Procedure: SHUNT REVISION;  Surgeon: Julio Sicks, MD;  Location: [facility] OR;  Service: Neurosurgery;  Laterality: Right;  . SHUNT REVISION VENTRICULAR-PERITONEAL Left 08/26/2018    Procedure: SHUNT REVISION VENTRICULAR-PERITONEAL;  Surgeon: Julio Sicks, MD;  Location: [facility] OR;  Service: Neurosurgery;  Laterality: Left;  . SHUNT REVISION VENTRICULAR-PERITONEAL Left 09/02/2018    Procedure: Left Occipital VP shunt revision;  Surgeon: Julio Sicks, MD;  Location: [facility] OR;  Service: Neurosurgery;  Laterality: Left;  [name] VASECTOMY   10/02/1997  .  VENTRICULOPERITONEAL SHUNT Right 12/18/2014    Procedure: Shunt Placment - right occipital VP shunt ;  Surgeon: Temple Pacini, MD;  Location: [facility] NEURO ORS;  Service: Neurosurgery;  Laterality: Right;  Shunt Placment - right occipital VP shunt   .  VENTRICULOPERITONEAL SHUNT Right 07/22/2018    Procedure: Shunt Placment right occipital;  Surgeon: Julio Sicks, MD;  Location: [facility] OR;  Service: Neurosurgery;  Laterality: Right;  . VENTRICULOPERITONEAL SHUNT Left 08/26/2018    Procedure: LEFT SIDED VENTRICULAR-PERITONEAL SHUNT;  Surgeon: Julio Sicks, MD;  Location: [facility] OR;  Service: Neurosurgery;  Laterality: Left;  [name] VENTRICULOSTOMY Right 08/09/2018    Procedure: VENTRICULOSTOMY;  Surgeon: Lisbeth Renshaw, MD;  Location: [facility] OR;  Service: Neurosurgery;  Laterality: Right;         Family History  Problem Relation Age of Onset  . COPD Mother    . Lung cancer Father    . Alzheimer's disease Father      Social History:  reports that he has never smoked. He has never used smokeless tobacco. He reports that he drinks alcohol. He reports that he does not use drugs. Allergies: No Known Allergies       Medications Prior to Admission  Medication Sig Dispense Refill  . acetaminophen (TYLENOL) 500 MG tablet Take 500-1,000 mg by mouth every 6 (six) hours as needed for headache (pain).      [name] atorvastatin (LIPITOR) 40 MG tablet Take 1 tablet (40 mg total) by mouth daily. 90 tablet 3  . hydroxypropyl methylcellulose / hypromellose (ISOPTO TEARS / GONIOVISC) 2.5 % ophthalmic solution Place 1 drop into both eyes as needed for dry eyes.      . Multiple Vitamins-Minerals (OCUVITE EYE HEALTH FORMULA) CAPS Take 1 capsule by mouth daily.      . ondansetron (ZOFRAN) 4 MG tablet Take 4 mg by mouth 4 (four) times daily as needed for nausea or vomiting.      . sodium chloride (OCEAN) 0.65 % SOLN nasal spray Place 1 spray into both nostrils as needed for congestion.      . traMADol (ULTRAM) 50 MG tablet Take 1 tablet (50 mg total) by mouth every 6 (six) hours as needed. (Patient taking differently: Take 50 mg by mouth at bedtime as needed (pain). ) 30 tablet 1  . Artificial Tear Ointment (DRY EYES OP) Apply 1 drop to eye daily as needed (for drye eyes).      . magic  mouthwash SOLN Take 5 mLs by mouth 4 (four) times daily as needed for mouth pain. 100 mL 0      Drug Regimen Review Drug regimen was reviewed and remains appropriate with no significant issues identified   Home: Home Living Family/patient expects to be discharged to:: Inpatient rehab Living Arrangements: Spouse/significant other Available Help at Discharge: Family, Available 24 hours/day Type of Home: House  Lives With: Spouse   Functional History: Prior Function Level of Independence: Needs assistance Gait / Transfers Assistance Needed: Per wife, was working with therapy services on ambulation with and without AD.  ADL's / Homemaking Assistance Needed: Required assist for ADLs in CIR.  Communication / Swallowing Assistance Needed: receptive and expressive communication deficits   Functional Status:  Mobility: Bed Mobility Overal bed mobility: Needs Assistance Bed Mobility: Supine to Sit Supine to sit: Min assist General bed mobility comments: pt up in recliner upon arrival Transfers Overall transfer level: Needs assistance Equipment used: 1 person hand held assist Transfers: Sit to/from Stand Sit to Stand: Min assist Stand  pivot transfers: Mod assist General transfer comment: pt unsteady upon standing with posterior lean and staggering stepping with feet. Pt with good technique to return to sitting Ambulation/Gait Ambulation/Gait assistance: Min assist Gait Distance (Feet): 150 Feet(with one seated rest break) Assistive device: 1 person hand held assist Gait Pattern/deviations: Step-through pattern, Decreased stride length, Shuffle, Staggering left, Staggering right General Gait Details: pt shuffling/inability to clear feet 50% of time requiring minA to prevent fall forward. pt constantly looking to wife for guidance on what to do and where to go. Pt able to follow immeadiate directional commands however not multistep commands Gait velocity: slow Gait velocity  interpretation: <1.8 ft/sec, indicate of risk for recurrent falls   ADL: ADL Overall ADL's : Needs assistance/impaired Eating/Feeding: Moderate assistance Eating/Feeding Details (indicate cue type and reason): family reoprts pt doing better with finger foods; states he sis doing better managing utensils Grooming: Minimal assistance Grooming Details (indicate cue type and reason): Pt with improved ability to sequence functional tasks; able to put on toothpaste adn brush although brushed teeth for approximately2 seoncds before spitting out toothpaste. Pt distracted by grey emesis basin on sink and began filling basin with water instead of spitting into sink.  Upper Body Bathing: Maximal assistance, Sitting Lower Body Bathing: Moderate assistance, Sit to/from stand Upper Body Dressing : Moderate assistance, Sitting Lower Body Dressing: Moderate assistance, Sit to/from stand Toilet Transfer: Minimal assistance Toileting- Clothing Manipulation and Hygiene: Minimal assistance Toileting - Clothing Manipulation Details (indicate cue type and reason): does not appear to assiciate sittin gon toilet with trying to use the bathroom; pt appeared to be perseverating on brushing teeth Functional mobility during ADLs: Minimal assistance General ADL Comments: Wife reported she and son A'd pt with shower over the weekend with staff S. She reports having to tell him almost every step of what he needed to do, but pt did bath. She also reported that he dressed himself once each item was presented with A for standing. We talked about him doing his bath and she trying not to cue him as well as handing him all of clothes and asking him to dress--to then see how he does with more of an "open" task.    Cognition: Cognition Overall Cognitive Status: Impaired/Different from baseline Arousal/Alertness: Awake/alert Orientation Level: Oriented to person, Oriented to time, Disoriented to place, Disoriented to  situation Attention: Sustained Sustained Attention: Impaired Sustained Attention Impairment: Functional basic, Verbal basic Awareness: Impaired Awareness Impairment: Intellectual impairment Problem Solving: Impaired Problem Solving Impairment: Verbal basic, Functional basic Executive Function: (all areas impacted by lower level deficits) Safety/Judgment: Impaired Comments: decreased awareness of deficits and decreased safety awareness Cognition Arousal/Alertness: Awake/alert Behavior During Therapy: Flat affect Overall Cognitive Status: Impaired/Different from baseline Area of Impairment: Attention, Memory, Following commands, Safety/judgement, Awareness, Problem solving Orientation Level: Disoriented to, Place, Time, Situation Current Attention Level: Focused Memory: Decreased short-term memory(difficulty with remembering with 1 step commands, does better with repeating command to help remember for longer in non-distracting environment) Following Commands: Follows one step commands inconsistently, Follows multi-step commands inconsistently Safety/Judgement: Decreased awareness of safety, Decreased awareness of deficits Awareness: Intellectual Problem Solving: Slow processing, Decreased initiation, Requires verbal cues, Difficulty sequencing, Requires tactile cues General Comments: Pt able to follow step by step commands if given directions on the go, but if you give him a direction and then say something else to him he forgets what he is doing. He did much better if not distracted and if he repeated what he was to do after  told what to do. He had great difficulty with telling colors, letters, and finding items asked of him on bulletin board. He would often perserverate on one thing (color, letter). Wife is a Midwife and so we talked about flash cards with numbers/letters/colors and that if pt perseverates to stop the task and rest or move onto another activity. Also to work on  giving him 2 choices and having him pick the one she askes him for. We also discussed her helping him to journal each of his activites during the day so he can look back at them to see what he had done that day.   Physical Exam: Blood pressure (!) 128/91, pulse (!) 117, temperature 97.6 F (36.4 C), temperature source Oral, resp. rate 20, weight 80.6 kg, SpO2 98 %. Physical Exam  Vitals reviewed. Constitutional: He appears well-developed and well-nourished. No distress.  HENT:  Head: Normocephalic.  Eyes: Pupils are equal, round, and reactive to light. EOM are normal.  Neck: No tracheal deviation present. No thyromegaly present.  Cardiovascular: Normal rate. Exam reveals no friction rub.  No murmur heard. Respiratory: Effort normal. No respiratory distress. He has no wheezes.  GI: Soft. He exhibits no distension. There is no tenderness.  Neurological:  Alert. Oriented to month, hospital, person. Follows simple commands but apraxic with significant delays in processing, apraxic. Can perseverate on a given task. Strength nearly 4 to 4+/5 bilaterally. Ongoing intentional tremor LUE and LLE.   Psychiatric: He has a normal mood and affect. His behavior is normal.      Lab Results Last 48 Hours  No results found for this or any previous visit (from the past 48 hour(s)).   Imaging Results (Last 48 hours)  No results found.           Medical Problem List and Plan: 1.  Unsteady gait with aphasia secondary to ventriculoperitoneal shunt malfunction status post revision 09/02/2018             -readmitting to inpatient rehab 2.  DVT Prophylaxis/Anticoagulation: SCDs.  Monitor for any signs of DVT 3. Pain Management: Ultram as needed 4. Mood: Provide emotional support 5. Neuropsych: This patient is capable of making decisions on his own behalf. 6. Skin/Wound Care: Routine skin checks 7. Fluids/Electrolytes/Nutrition: Routine in and outs with follow-up chemistries 8.  Constipation.  Laxative  assistance 9.  Hyperlipidemia.  Lipitor     Post Admission Physician Evaluation: 1. Functional deficits secondary  to hydrocephalus, vps malfunction. 2. Patient is admitted to receive collaborative, interdisciplinary care between the physiatrist, rehab nursing staff, and therapy team. 3. Patient's level of medical complexity and substantial therapy needs in context of that medical necessity cannot be provided at a lesser intensity of care such as a SNF. 4. Patient has experienced substantial functional loss from his/her baseline which was documented above under the "Functional History" and "Functional Status" headings.  Judging by the patient's diagnosis, physical exam, and functional history, the patient has potential for functional progress which will result in measurable gains while on inpatient rehab.  These gains will be of substantial and practical use upon discharge  in facilitating mobility and self-care at the household level. 5. Physiatrist will provide 24 hour management of medical needs as well as oversight of the therapy plan/treatment and provide guidance as appropriate regarding the interaction of the two. 6. The Preadmission Screening has been reviewed and patient status is unchanged unless otherwise stated above. 7. 24 hour rehab nursing will assist with bladder management, bowel  management, safety, skin/wound care, disease management, medication administration, pain management and patient education  and help integrate therapy concepts, techniques,education, etc. 8. PT will assess and treat for/with: Lower extremity strength, range of motion, stamina, balance, functional mobility, safety, adaptive techniques and equipment, NMR, balance, cognitive-perceptual rx.   Goals are: supervision. 9. OT will assess and treat for/with: ADL's, functional mobility, safety, upper extremity strength, adaptive techniques and equipment, NMR, family, cognitive perceptual rx.   Goals are: supervision.  Therapy may proceed with showering this patient. 10. SLP will assess and treat for/with: cognition, communication, family ed.  Goals are: supervision. 11. Case Management and Social Worker will assess and treat for psychological issues and discharge planning. 12. Team conference will be held weekly to assess progress toward goals and to determine barriers to discharge. 13. Patient will receive at least 3 hours of therapy per day at least 5 days per week. 14. ELOS: 7 days       15. Prognosis:  excellent     I have personally performed a face to face diagnostic evaluation of this patient and formulated the key components of the plan.  Additionally, I have personally reviewed laboratory data, imaging studies, as well as relevant notes and concur with the physician assistant's documentation above.   Ranelle Oyster, MD, [place] [facility]     Mcarthur Rossetti Angiulli, PA-C 09/09/2018  The patient's status has not changed. The original post admission physician evaluation remains appropriate, and any changes from the pre-admission screening or documentation from the acute chart are noted above.  Ranelle Oyster, MD 09/10/2018

## 2018-09-10 NOTE — PMR Pre-admission (Signed)
PMR Admission Coordinator Pre-Admission Assessment  Patient: Charles Marquez is an 62 y.o., male MRN: [id number] DOB: 1955-12-11 Height:   Weight: 80.6 kg              Insurance Information HMO:     PPO: yes     PCP:      IPA:      80/20:      OTHER:  PRIMARY: [place]      Policy#: [id number]      Subscriber: pt CM Name: [name].      Phone#: [phone]     Fax#: [phone] Pre-Cert#: [id number] for 7 days. Use rehab tool form for concurrent reviews      Employer: [employer]; Laid off;  Benefits:  Phone #: [phone]     Name: 08/29/2018 Eff. Date: 11/13/2017     Deduct: $2000/met      Out of Pocket Max: $6750/met      Life Max: none CIR: Was not a covered benefit last admit to CIR and account executive gave approval; 0n 09/10/18, Katharine Look, CM says it is now a covered benefit; 80%      SNF: 80% Outpatient: 80 %     Co-Pay: 90 visits combined Home Health: 80%      Co-Pay: 90 visits combined DME: 80%     Co-Pay: 20% Providers: in network  SECONDARY: none       Medicaid Application Date:       Case Manager:  Disability Application Date:       Case Worker:   Emergency Facilities manager Information    Name Relation Home Work [place] Spouse [phone]  [phone]   [place] Daughter [phone]  [phone]     Current Medical History  Patient Admitting Diagnosis: VP shunt revision  History of Present Illness: Arjan Strohm is a 62 year old right-handed male with history of hyperlipidemia, 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    Patient with recent admission 08/04/2018 for unsteadiness of gait and nausea vomiting findings of malfunction of VP shunt with revision completed 08/05/2018 and discharged home.  He was readmitted 08/08/2018 to 08/22/2018 for VP shunt infection organism isolated Klebsiella.  He again underwent removal of VP shunt with placement of external ventriculostomy  and completed a 2-week course of IV antibiotics.  Follow-up scan reviewed showing right hydrocephalus.  Per report, mildly enlarged right lateral ventricle but left ventricle and third ventricle and fourth ventricle were small.  Noted generalized decline with unsteady gait noted bouts of aphasia.  He was again readmitted 08/26/2018 for shunt revision ventricular peritoneal 08/26/2018 per Dr. Annette Stable.  Subtends heparin added for DVT prophylaxis 08/29/2018.  He was admitted to inpatient rehab services 08/30/2018 with slow but progressive gains.  Patient with generalized decline more somnolence 09/01/2018.  A follow-up CT of the head showed stable appearance of ventriculomegaly with unchanged positioning of ventriculostomy catheter.  Follow-up neurosurgery suspect VP malfunction.  He was discharged to acute care services 09/02/2018 and again underwent shunt revision per Dr. Annette Stable.  Latest cranial CT scan 09/05/2018 showed nearly resolved hydrocephalus stable appearance of VP shunt.    Past Medical History  Past Medical History:  Diagnosis Date  . Anxiety   . Hypercholesteremia   . Stroke ([place])    tia's  . TIA (transient ischemic attack)    09.15    Family History  family history includes Alzheimer's disease in his father; COPD  in his mother; Lung cancer in his father.  Prior Rehab/Hospitalizations:  Has the patient had major surgery during 100 days prior to admission? Yes CIR 10/18 until 10/21 when readmitted to acute  Current Medications   Current Facility-Administered Medications:  .  acetaminophen (TYLENOL) tablet 650 mg, 650 mg, Oral, Q4H PRN **OR** acetaminophen (TYLENOL) suppository 650 mg, 650 mg, Rectal, Q4H PRN, Earnie Larsson, MD .  acetaminophen (TYLENOL) tablet 500-1,000 mg, 500-1,000 mg, Oral, Q6H PRN, Earnie Larsson, MD .  atorvastatin (LIPITOR) tablet 40 mg, 40 mg, Oral, Daily, Pool, Mallie Mussel, MD, 40 mg at 09/10/18 0914 .  bisacodyl (DULCOLAX) EC tablet 5 mg, 5 mg, Oral, Daily PRN, Reinaldo Meeker,  Meghan D, NP, 5 mg at 09/03/18 1151 .  bisacodyl (DULCOLAX) suppository 10 mg, 10 mg, Rectal, Daily PRN, Earnie Larsson, MD .  docusate sodium (COLACE) capsule 100 mg, 100 mg, Oral, BID, Earnie Larsson, MD, 100 mg at 09/10/18 0914 .  famotidine (PEPCID) tablet 20 mg, 20 mg, Oral, BID, Bergman, Meghan D, NP, 20 mg at 09/10/18 0914 .  feeding supplement (ENSURE ENLIVE) (ENSURE ENLIVE) liquid 237 mL, 237 mL, Oral, BID BM, Earnie Larsson, MD, 237 mL at 09/10/18 0914 .  HYDROcodone-acetaminophen (NORCO/VICODIN) 5-325 MG per tablet 1 tablet, 1 tablet, Oral, Q4H PRN, Earnie Larsson, MD .  HYDROmorphone (DILAUDID) injection 0.5-1 mg, 0.5-1 mg, Intravenous, Q2H PRN, Earnie Larsson, MD .  labetalol (NORMODYNE,TRANDATE) injection 10-40 mg, 10-40 mg, Intravenous, Q10 min PRN, Earnie Larsson, MD .  magic mouthwash, 5 mL, Oral, QID PRN, Earnie Larsson, MD, 5 mL at 09/06/18 1102 .  multivitamin (PROSIGHT) tablet 1 tablet, 1 tablet, Oral, Daily, Pool, Mallie Mussel, MD, 1 tablet at 09/10/18 0914 .  naloxone [facility]) injection 0.08 mg, 0.08 mg, Intravenous, PRN, Earnie Larsson, MD .  ondansetron (ZOFRAN) tablet 4 mg, 4 mg, Oral, Q4H PRN **OR** ondansetron (ZOFRAN) injection 4 mg, 4 mg, Intravenous, Q4H PRN, Earnie Larsson, MD .  ondansetron (ZOFRAN) tablet 4 mg, 4 mg, Oral, QID PRN, Earnie Larsson, MD .  polyethylene glycol (MIRALAX / GLYCOLAX) packet 17 g, 17 g, Oral, Daily PRN, Earnie Larsson, MD .  promethazine (PHENERGAN) tablet 12.5-25 mg, 12.5-25 mg, Oral, Q4H PRN, Earnie Larsson, MD .  sodium chloride (OCEAN) 0.65 % nasal spray 1 spray, 1 spray, Each Nare, PRN, Earnie Larsson, MD, 1 spray at 09/09/18 1133 .  sodium phosphate (FLEET) 7-19 GM/118ML enema 1 enema, 1 enema, Rectal, Once PRN, Earnie Larsson, MD .  traMADol Veatrice Bourbon) tablet 50 mg, 50 mg, Oral, QHS PRN, Earnie Larsson, MD  Patients Current Diet:  Diet Order            Diet Carb Modified Fluid consistency: Thin; Room service appropriate? Yes  Diet effective now              Precautions /  Restrictions Precautions Precautions: Fall Precaution Comments: extremely short memory, 5 sec (does better if he repeats what he is asked to do) Restrictions Weight Bearing Restrictions: No   Has the patient had 2 or more falls or a fall with injury in the past year?No  Prior Activity Level Limited Community (1-2x/wk): laid off job with [employer]; was to begin contract work  Development worker, international aid / Paramedic Devices/Equipment: [place]: None  Prior Device Use: Indicate devices/aids used by the patient prior to current illness, exacerbation or injury? None of the above  Prior Functional Level Prior Function Level of Independence: Needs assistance Gait / Transfers Assistance Needed: Per wife,  was working with therapy services on ambulation with and without AD.  ADL's / Homemaking Assistance Needed: Required assist for ADLs in CIR.  Communication / Swallowing Assistance Needed: receptive and expressive communication deficits Comments: works in Engineer, technical sales for [employer]; umpires for baseball games, went home w/ wife before most recent revision, was ambulating w/o AD  Self Care: Did the patient need help bathing, dressing, using the toilet or eating?  Independent  Indoor Mobility: Did the patient need assistance with walking from room to room (with or without device)? Independent  Stairs: Did the patient need assistance with internal or external stairs (with or without device)? Independent  Functional Cognition: Did the patient need help planning regular tasks such as shopping or remembering to take medications? Independent  Current Functional Level Cognition  Arousal/Alertness: Awake/alert Overall Cognitive Status: Impaired/Different from baseline Current Attention Level: Focused Orientation Level: Oriented to person, Oriented to place, Disoriented to situation, Disoriented to time Following Commands: Follows one step commands inconsistently, Follows  multi-step commands inconsistently Safety/Judgement: Decreased awareness of safety, Decreased awareness of deficits General Comments: Pt able to follow step by step commands if given directions on the go, but if you give him a direction and then say something else to him he forgets what he is doing. He did much better if not distracted and if he repeated what he was to do after told what to do. He had great difficulty with telling colors, letters, and finding items asked of him on bulletin board. He would often perserverate on one thing (color, letter). Wife is a Oncologist and so we talked about flash cards with numbers/letters/colors and that if pt perseverates to stop the task and rest or move onto another activity. Also to work on giving him 2 choices and having him pick the one she askes him for. We also discussed her helping him to journal each of his activites during the day so he can look back at them to see what he had done that day. Attention: Sustained Sustained Attention: Impaired Sustained Attention Impairment: Functional basic, Verbal basic Memory: Impaired Awareness: Impaired Awareness Impairment: Intellectual impairment Problem Solving: Impaired Problem Solving Impairment: Verbal basic, Functional basic Executive Function: (all areas impacted by lower level deficits) Safety/Judgment: Impaired Comments: decreased awareness of deficits and decreased safety awareness    Extremity Assessment (includes Sensation/Coordination)  Upper Extremity Assessment: RUE deficits/detail RUE Deficits / Details: improving functional use of RUE during ADL tasks; using spontaneously RUE Coordination: decreased fine motor, decreased gross motor  Lower Extremity Assessment: Defer to PT evaluation RLE Deficits / Details: RLE weakness noted functionally and noted decreased coordination functionall.  RLE Coordination: decreased gross motor, decreased fine motor    ADLs  Overall ADL's : Needs  assistance/impaired Eating/Feeding: Moderate assistance Eating/Feeding Details (indicate cue type and reason): family reoprts pt doing better with finger foods; states he sis doing better managing utensils Grooming: Minimal assistance Grooming Details (indicate cue type and reason): Pt with improved ability to sequence functional tasks; able to put on toothpaste adn brush although brushed teeth for approximately2 seoncds before spitting out toothpaste. Pt distracted by grey emesis basin on sink and began filling basin with water instead of spitting into sink.  Upper Body Bathing: Maximal assistance, Sitting Lower Body Bathing: Moderate assistance, Sit to/from stand Upper Body Dressing : Moderate assistance, Sitting Lower Body Dressing: Moderate assistance, Sit to/from stand Toilet Transfer: Minimal assistance Toileting- Clothing Manipulation and Hygiene: Minimal assistance Toileting - Clothing Manipulation Details (indicate cue type and reason):  does not appear to assiciate sittin gon toilet with trying to use the bathroom; pt appeared to be perseverating on brushing teeth Functional mobility during ADLs: Minimal assistance General ADL Comments: Wife reported she and son A'd pt with shower over the weekend with staff S. She reports having to tell him almost every step of what he needed to do, but pt did bath. She also reported that he dressed himself once each item was presented with A for standing. We talked about him doing his bath and she trying not to cue him as well as handing him all of clothes and asking him to dress--to then see how he does with more of an "open" task.     Mobility  Overal bed mobility: Needs Assistance Bed Mobility: Supine to Sit Supine to sit: Min assist General bed mobility comments: pt up in recliner upon arrival    Transfers  Overall transfer level: Needs assistance Equipment used: 1 person hand held assist Transfers: Sit to/from Stand Sit to Stand: Min  assist Stand pivot transfers: Mod assist General transfer comment: pt unsteady upon standing with posterior lean and staggering stepping with feet. Pt with good technique to return to sitting    Ambulation / Gait / Stairs / Wheelchair Mobility  Ambulation/Gait Ambulation/Gait assistance: Herbalist (Feet): 150 Feet(with one seated rest break) Assistive device: 1 person hand held assist Gait Pattern/deviations: Step-through pattern, Decreased stride length, Shuffle, Staggering left, Staggering right General Gait Details: pt shuffling/inability to clear feet 50% of time requiring minA to prevent fall forward. pt constantly looking to wife for guidance on what to do and where to go. Pt able to follow immeadiate directional commands however not multistep commands Gait velocity: slow Gait velocity interpretation: <1.8 ft/sec, indicate of risk for recurrent falls    Posture / Balance Dynamic Sitting Balance Sitting balance - Comments: able to don shoes on while sitting EOB Balance Overall balance assessment: Needs assistance Sitting-balance support: Feet supported, No upper extremity supported Sitting balance-Leahy Scale: Good Sitting balance - Comments: able to don shoes on while sitting EOB Postural control: Posterior lean Standing balance support: No upper extremity supported Standing balance-Leahy Scale: Poor Standing balance comment: statically he does well (fair), dynamically he needs external support Standardized Balance Assessment Standardized Balance Assessment : Berg Balance Test Berg Balance Test Sit to Stand: Able to stand  independently using hands Standing Unsupported: Able to stand 30 seconds unsupported Sitting with Back Unsupported but Feet Supported on Floor or Stool: Able to sit 2 minutes under supervision Stand to Sit: Controls descent by using hands Transfers: Able to transfer with verbal cueing and /or supervision From Standing Position, Pick up Object  from Floor: Unable to pick up and needs supervision From Standing Position, Turn to Look Behind Over each Shoulder: Looks behind one side only/other side shows less weight shift Turn 360 Degrees: Able to turn 360 degrees safely but slowly Dynamic Gait Index Level Surface: Mild Impairment Change in Gait Speed: Moderate Impairment Gait with Horizontal Head Turns: Moderate Impairment Gait with Vertical Head Turns: Mild Impairment    Special needs/care consideration BiPAP/CPAP n/a CPM n/a Continuous Drip IV n/a Dialysis n/a Life Vest n/a Oxygen n/a Special Bed n/a Trach Size n/a Wound Vac n/a Skin surgical incision  Bowel mgmt: continent LBM 10/28 Bladder mgmt: come incontinence with use of external catheter Diabetic mgmt n/a   Previous Home Environment Living Arrangements: Spouse/significant other  Lives With: Spouse Available Help at Discharge: Family, Available 24 hours/day Type  of Home: House Home Layout: Two level, 1/2 bath on main level, Bed/bath upstairs Alternate Level Stairs-Rails: Left Alternate Level Stairs-Number of Steps: flight  Home Access: Stairs to enter Entrance Stairs-Rails: Can reach both Entrance Stairs-Number of Steps: 5 Bathroom Shower/Tub: Tub/shower unit, Multimedia programmer: Standard Bathroom Accessibility: Yes How Accessible: Accessible via walker Bear Dance: No Additional Comments: may need [facility]  Discharge Living Setting Plans for Discharge Living Setting: Patient's home, Lives with (comment) Type of Home at Discharge: House Discharge Home Layout: Two level, 1/2 bath on main level, Bed/bath upstairs Alternate Level Stairs-Rails: Left Alternate Level Stairs-Number of Steps: flight Discharge Home Access: Stairs to enter Entrance Stairs-Rails: Right, Left, Can reach both Entrance Stairs-Number of Steps: 5 Discharge Bathroom Shower/Tub: Tub/shower unit, Walk-in shower Discharge Bathroom Toilet: Standard Discharge Bathroom  Accessibility: Yes How Accessible: Accessible via walker Does the patient have any problems obtaining your medications?: No  Social/Family/Support Systems Patient Roles: Spouse, Parent, Other (Comment) Contact Information: wife, [name] Anticipated Caregiver: Wife Anticipated Caregiver's Contact Information: see above Ability/Limitations of Caregiver: Wife is taking a FMLA from school Caregiver Availability: 24/7 Discharge Plan Discussed with Primary Caregiver: Yes Is Caregiver In Agreement with Plan?: Yes Does Caregiver/Family have Issues with Lodging/Transportation while Pt is in Rehab?: No  Goals/Additional Needs Patient/Family Goal for Rehab: supervision PT, OT, and SLP Expected length of stay: ELOS 7 days Pt/Family Agrees to Admission and willing to participate: Yes Program Orientation Provided & Reviewed with Pt/Caregiver Including Roles  & Responsibilities: Yes  Decrease burden of Care through IP rehab admission: n/a   Possible need for SNF placement upon discharge: not anticipated  Patient Condition: This patient's medical and functional status has changed since the consult dated: 08/28/2018 as well as admit to AIR 10/18 in which the Rehabilitation Physician determined and documented that the patient's condition is appropriate for intensive rehabilitative care in an inpatient rehabilitation facility. See "History of Present Illness" (above) for medical update. Functional changes are: overall min assist. Patient's medical and functional status update has been discussed with the Rehabilitation physician and patient remains appropriate for readmission to  inpatient rehabilitation. Will admit to inpatient rehab today.  Preadmission Screen Completed By:  Cleatrice Burke, 09/10/2018 1:22 PM ______________________________________________________________________   Discussed status with Dr. Naaman Plummer on 09/10/2018 at  1331 and received telephone approval for admission  today.  Admission Coordinator:  Cleatrice Burke, time 9892 Date 09/10/2018

## 2018-09-10 NOTE — Progress Notes (Signed)
Inpatient Rehabilitation Admissions Coordinator  I have insurance approval to readmit pt to inpt rehab today. I have notified RN, RN CM, SW and Dr Jordan Likes. I will make the arrangements to admit today.  Ottie Glazier, RN, MSN Rehab Admissions Coordinator [phone] 09/10/2018 12:29 PM'

## 2018-09-11 ENCOUNTER — Inpatient Hospital Stay (HOSPITAL_COMMUNITY): Payer: BLUE CROSS/BLUE SHIELD | Admitting: Occupational Therapy

## 2018-09-11 ENCOUNTER — Inpatient Hospital Stay (HOSPITAL_COMMUNITY): Payer: BLUE CROSS/BLUE SHIELD | Admitting: Speech Pathology

## 2018-09-11 ENCOUNTER — Inpatient Hospital Stay (HOSPITAL_COMMUNITY): Payer: BLUE CROSS/BLUE SHIELD | Admitting: Physical Therapy

## 2018-09-11 DIAGNOSIS — Z982 Presence of cerebrospinal fluid drainage device: Secondary | ICD-10-CM

## 2018-09-11 DIAGNOSIS — E8809 Other disorders of plasma-protein metabolism, not elsewhere classified: Secondary | ICD-10-CM

## 2018-09-11 DIAGNOSIS — D62 Acute posthemorrhagic anemia: Secondary | ICD-10-CM

## 2018-09-11 DIAGNOSIS — E46 Unspecified protein-calorie malnutrition: Secondary | ICD-10-CM

## 2018-09-11 LAB — COMPREHENSIVE METABOLIC PANEL
ALBUMIN: 2.9 g/dL — AB (ref 3.5–5.0)
ALK PHOS: 37 U/L — AB (ref 38–126)
ALT: 22 U/L (ref 0–44)
ANION GAP: 7 (ref 5–15)
AST: 16 U/L (ref 15–41)
BUN: 21 mg/dL — ABNORMAL HIGH (ref 6–20)
CALCIUM: 8.8 mg/dL — AB (ref 8.9–10.3)
CO2: 28 mmol/L (ref 22–32)
Chloride: 104 mmol/L (ref 98–111)
Creatinine, Ser: 0.86 mg/dL (ref 0.61–1.24)
GFR calc non Af Amer: >60 mL/min (ref >60)
GLUCOSE: 100 mg/dL — AB (ref 70–99)
POTASSIUM: 4.7 mmol/L (ref 3.5–5.1)
SODIUM: 139 mmol/L (ref 135–145)
TOTAL PROTEIN: 6.1 g/dL — AB (ref 6.5–8.1)
Total Bilirubin: 0.4 mg/dL (ref 0.3–1.2)

## 2018-09-11 LAB — CBC WITH DIFFERENTIAL/PLATELET
Abs Immature Granulocytes: 0.03 10*3/uL (ref 0.00–0.07)
BASOS ABS: 0 10*3/uL (ref 0.0–0.1)
Basophils Relative: 1 %
Eosinophils Absolute: 0.2 10*3/uL (ref 0.0–0.5)
Eosinophils Relative: 4 %
HCT: 37.2 % — ABNORMAL LOW (ref 39.0–52.0)
HEMOGLOBIN: 11.6 g/dL — AB (ref 13.0–17.0)
Immature Granulocytes: 1 %
LYMPHS ABS: 1.1 10*3/uL (ref 0.7–4.0)
Lymphocytes Relative: 18 %
MCH: 30.1 pg (ref 26.0–34.0)
MCHC: 31.2 g/dL (ref 30.0–36.0)
MCV: 96.4 fL (ref 80.0–100.0)
Monocytes Absolute: 0.9 10*3/uL (ref 0.1–1.0)
Monocytes Relative: 14 %
NEUTROS ABS: 3.9 10*3/uL (ref 1.7–7.7)
NRBC: 0 % (ref 0.0–0.2)
Neutrophils Relative %: 62 %
Platelets: 189 10*3/uL (ref 150–400)
RBC: 3.86 MIL/uL — ABNORMAL LOW (ref 4.22–5.81)
RDW: 13.2 % (ref 11.5–15.5)
WBC: 6.1 10*3/uL (ref 4.0–10.5)

## 2018-09-11 MED ORDER — PRO-STAT SUGAR FREE PO LIQD
30.0000 mL | Freq: Two times a day (BID) | ORAL | Status: DC
  Administered 2018-09-11 – 2018-09-18 (×14): 30 mL via ORAL
  Filled 2018-09-11 (×14): qty 30

## 2018-09-11 NOTE — Progress Notes (Signed)
Social Work Patient ID: Charles Marquez, male   DOB: 11-30-1955, 62 y.o.   MRN: [id number] Met with pt and wife to discuss team conference goals supervision level and target discharge date 11/6. Both are pleased with how well he is doing now he is back here on CIR. Wife stays here with him and is participating in therapies with pt and help orient and cue him. Discussed OP therapies will call today to get him in so there is no lag time between discharge and OP appointment.

## 2018-09-11 NOTE — Evaluation (Signed)
Physical Therapy Assessment and Plan  Patient Details  Name: Charles Marquez MRN: [id number] Date of Birth: 1956/04/23  PT Diagnosis: Abnormality of gait, Cognitive deficits, Coordination disorder, Difficulty walking and Impaired cognition Rehab Potential: Excellent ELOS: 7 days    Today's Date: 09/11/2018 PT Individual Time: 8346-2194 PT Individual Time Calculation (min): 58 min    Problem List:  Patient Active Problem List   Diagnosis Date Noted  . Hypoalbuminemia due to protein-calorie malnutrition ([place])   . Acute blood loss anemia   . S/P VP shunt   . Hydrocephalus ([place]) 08/30/2018  . Dyslipidemia   . History of CVA (cerebrovascular accident)   . Benign essential HTN   . Tachycardia   . Leukocytosis   . Hyponatremia   . Hypokalemia   . 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 Infection of ventricular shunt ([place]) 08/09/2018  . Bacterial meningitis 08/09/2018  . TIA (transient ischemic attack) 01/29/2017  . Acute encephalopathy   . Shunt malfunction 03/13/2016  . 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 Hyperlipidemia 10/20/2014  . Degenerative disc disease, lumbar 04/15/2013  . Routine general medical examination at a health care facility 07/23/2012  . DISTURBANCE OF SKIN SENSATION 10/05/2008  . HYPERLIPIDEMIA 01/01/2008  . MYCOPLASMA PNEUMONIA 01/01/2008    Past Medical History:  Past Medical History:  Diagnosis Date  . Anxiety   . Hypercholesteremia   . Stroke ([place])    tia's  . TIA (transient ischemic attack)    09.15   Past Surgical History:  Past Surgical History:  Procedure Laterality Date  . Fractured arm Left 12  . HERNIA REPAIR Right 3/12  . LAPAROSCOPIC REVISION VENTRICULAR-PERITONEAL (V-P) SHUNT N/A 08/26/2018   Procedure: LAPAROSCOPIC INSERTION VENTRICULAR-PERITONEAL (V-P) SHUNT;  Surgeon: Earnie Larsson, MD;  Location: [facility];  Service: Neurosurgery;  Laterality: N/A;  . LOOP RECORDER  INSERTION N/A 04/10/2017   Procedure: Loop Recorder Insertion;  Surgeon: Thompson Grayer, MD;  Location: [facility];  Service: Cardiovascular;  Laterality: N/A;  . SHUNT REMOVAL Right 03/13/2016   Procedure: SHUNT REMOVAL;  Surgeon: Earnie Larsson, MD;  Location: [facility];  Service: Neurosurgery;  Laterality: Right;  . SHUNT REMOVAL Right 08/09/2018   Procedure: SHUNT REMOVAL With Placement of Ventricular Catheter;  Surgeon: Consuella Lose, MD;  Location: [facility];  Service: Neurosurgery;  Laterality: Right;  . SHUNT REVISION Right 08/05/2018   Procedure: SHUNT REVISION;  Surgeon: Earnie Larsson, MD;  Location: [facility];  Service: Neurosurgery;  Laterality: Right;  . SHUNT REVISION VENTRICULAR-PERITONEAL Left 08/26/2018   Procedure: SHUNT REVISION VENTRICULAR-PERITONEAL;  Surgeon: Earnie Larsson, MD;  Location: [facility];  Service: Neurosurgery;  Laterality: Left;  . SHUNT REVISION VENTRICULAR-PERITONEAL Left 09/02/2018   Procedure: Left Occipital VP shunt revision;  Surgeon: Earnie Larsson, MD;  Location: [facility];  Service: Neurosurgery;  Laterality: Left;  [name] VASECTOMY  10/02/1997  . VENTRICULOPERITONEAL SHUNT Right 12/18/2014   Procedure: Shunt Placment - right occipital VP shunt ;  Surgeon: Charlie Pitter, MD;  Location: [facility];  Service: Neurosurgery;  Laterality: Right;  Shunt Placment - right occipital VP shunt   . VENTRICULOPERITONEAL SHUNT Right 07/22/2018   Procedure: Shunt Placment right occipital;  Surgeon: Earnie Larsson, MD;  Location: [facility];  Service: Neurosurgery;  Laterality: Right;  . VENTRICULOPERITONEAL SHUNT Left 08/26/2018   Procedure: LEFT SIDED VENTRICULAR-PERITONEAL SHUNT;  Surgeon: Earnie Larsson, MD;  Location: [facility];  Service: Neurosurgery;  Laterality: Left;  [name] VENTRICULOSTOMY Right 08/09/2018  Procedure: VENTRICULOSTOMY;  Surgeon: Consuella Lose, MD;  Location: [facility];  Service: Neurosurgery;  Laterality: Right;    Assessment & Plan Clinical Impression:   Charles Marquez a  62 year old right-handed male with history of hyperlipidemia, 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 Per chart review and wife, patient lives with spouse. He was working on Engineer, technical sales for [employer]. Patient ambulating without assistive device after a recent discharge. Two-level home 5 steps to entry. Patient with recent admission 08/04/2018 for unsteadiness of gait and nausea vomiting findings of malfunction of VP shunt with revision completed 08/05/2018 and discharged home. He was readmitted 08/08/2018 to 08/22/2018 for VP shunt infection organism isolated Klebsiella. He again underwent removal of VP shunt with placement of external ventriculostomy and completed a 2-week course of IV antibiotics. Follow-up scan reviewed showing right hydrocephalus. Per report, mildly enlarged right lateral ventricle but left ventricle and third ventricle and fourth ventricle were small. Noted generalized decline with unsteady gait noted bouts of aphasia. He was again readmitted 08/26/2018 for shunt revision ventricular peritoneal 08/26/2018 per Dr. Annette Stable. Subtends heparin added for DVT prophylaxis 08/29/2018. He was admitted to inpatient rehab services 08/30/2018 with slow but progressive gains. Patient with generalized decline more somnolence 09/01/2018. A follow-up CT of the head showed stable appearance of ventriculomegaly with unchanged positioning of ventriculostomy catheter. Follow-up neurosurgery suspect VP malfunction. He was discharged to acute care services 09/02/2018 and again underwent shunt revision per Dr. Annette Stable. Latest cranial CT scan 09/05/2018 showed nearly resolved hydrocephalus stable appearance of VP shunt. Therapies again resumed with progressive gains and patient was readmitted to inpatient rehab services to continue comprehensive rehabilitation. Patient transferred to CIR on 09/10/2018 .   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  Prior to hospitalization, patient was independent  with mobility and lived with Spouse in a House home.  Home access is 5Stairs to enter.  Patient will benefit from skilled PT intervention to maximize safe functional mobility, minimize fall risk and decrease caregiver burden for planned discharge home with 24 hour supervision.  Anticipate patient will benefit from follow up OP at discharge.  PT - End of Session Activity Tolerance: Tolerates 30+ min activity without fatigue PT Assessment Rehab Potential (ACUTE/IP ONLY): Excellent PT Patient demonstrates impairments in the following area(s): Balance;Behavior;Endurance;Motor;Perception;Safety PT Transfers Functional Problem(s): Bed Mobility;Bed to Chair;Car;Furniture;Floor PT Locomotion Functional Problem(s): Ambulation;Stairs PT Plan PT Intensity: Minimum of 1-2 x/day ,45 to 90 minutes PT Frequency: 5 out of 7 days PT Duration Estimated Length of Stay: 7 days  PT Treatment/Interventions: Ambulation/gait training;Disease management/prevention;Pain management;Stair training;Visual/perceptual remediation/compensation;Therapeutic Activities;Wheelchair propulsion/positioning;Patient/family education;DME/adaptive equipment instruction;Balance/vestibular training;Cognitive remediation/compensation;Functional electrical stimulation;Psychosocial support;Therapeutic Exercise;UE/LE Strength taining/ROM;Skin care/wound management;Functional mobility training;Community reintegration;Discharge planning;Neuromuscular re-education;Splinting/orthotics;UE/LE Coordination activities PT Transfers Anticipated Outcome(s): supervision   PT Locomotion Anticipated Outcome(s): supervision  PT Recommendation Recommendations for Other Services: Therapeutic Recreation consult;Neuropsych consult Therapeutic Recreation Interventions: Pet therapy;Stress management Follow Up Recommendations: Outpatient PT;24 hour supervision/assistance Patient destination: Home Equipment Recommended: To be determined  Skilled Therapeutic Intervention  PT order received and evaluation/treatment plan initiated. Patient demonstrates ongoing significant deficits in gait pattern and functional balance skills, as well as severe impairments in motor planning and appears to demonstrate significant levels of motor apraxia. He  requires intermittent hand-over-hand practice of unfamiliar motor tasks, such as positioning for RAM and MMT, as well as contact guard for more familiar tasks such as gait and transfers. MMT 5/5 and all motor impairments seem likely related to apraxia and perseveration rather than true strength deficit. Poor cognitive processing noted- patient unable to perform word recall and requires Max cues for navigation in hallway, note ongoing perseveration and very short attention span. Gait pattern demonstrates intermittent scissoring pattern with poor base of support however patient able to maintain balance with min guard in controlled environment, however requires MinA for balance when ambulating over uneven surfaces. 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 Worked on combination of balance training and problem solving by completing puzzle with Max cues while standing on foam pad to address balance impairment. He displays functional impairment as described in further detail in this evaluation and will strongly benefit from skilled PT services in the intensive CIR setting.   PT Evaluation Precautions/Restrictions Precautions Precautions: Fall Precaution Comments: short memory, poor safety  awareness and recall/carryover  Restrictions Weight Bearing Restrictions: No General Chart Reviewed: Yes Additional Pertinent History: multiple surgeries for VP shunt complications Response to Previous Treatment: Not applicable Vital SignsTherapy Vitals Temp: (!) 97.4 F (36.3 C) Temp Source: Oral Pulse Rate: 88 Resp: 14 BP: 119/89 Patient Position (if appropriate): Sitting Oxygen Therapy SpO2: 99 % O2 Device: Room Air Pain Pain Assessment Pain Scale: 0-10 Pain Score: 0-No pain Faces Pain Scale: No hurt Home Living/Prior Functioning Home Living Available Help at Discharge: Family;Available 24 hours/day Type of Home: House Home Access: Stairs to enter [employer] of Steps: 5 Entrance Stairs-Rails: Can reach both Home Layout: Two level;1/2 bath on main level;Bed/bath upstairs Alternate Level Stairs-Number of Steps: flight  Alternate Level Stairs-Rails: Left Bathroom Shower/Tub: Tub/shower unit;Walk-in shower Bathroom Toilet: Standard Bathroom Accessibility: Yes Additional Comments: may need BSC  Lives With: Spouse Prior Function Level of Independence: Independent with basic ADLs;Independent with transfers;Independent with homemaking with wheelchair;Independent with gait  Able to Take Stairs?: Yes Driving: Yes Vocation: Full time employment Comments: works in Engineer, technical sales for [employer]; umpires for baseball games, went home w/ wife before most recent revision, was ambulating w/o AD Vision/Perception  Vision - Assessment Eye Alignment: Within Functional Limits Ocular Range of Motion: Within Functional Limits Alignment/Gaze Preference: Within Defined Limits Tracking/Visual Pursuits: Decreased smoothness of horizontal tracking;Decreased smoothness of vertical tracking Saccades: Impaired - to be further tested in functional context Convergence: Impaired - to be further tested in functional context Diplopia Assessment: Other (comment)(wife and patient report it comes and  goes intermittently without solid pattern ) Perception Comments: did not note R inattention or spatial awareness deficits during evaluation or during functional tasks performed during treatments  Praxis Praxis: Impaired Praxis Impairment Details: Perseveration;Motor planning;Ideomotor  Cognition Overall Cognitive Status: Impaired/Different from baseline Arousal/Alertness: Awake/alert Orientation Level: Oriented to person;Disoriented to place;Disoriented to time;Disoriented to situation Attention: Sustained Sustained Attention: Impaired Sustained Attention Impairment: Functional basic;Verbal basic Memory: Impaired Memory Impairment: Retrieval deficit;Storage deficit(possible aphasia per speech therapist ) Awareness: Impaired Awareness Impairment: Emergent impairment Problem Solving: Impaired Problem Solving Impairment: Verbal basic;Functional basic Executive Function: (all areas impacted ) Behaviors: Impulsive;Perseveration Safety/Judgment: Impaired Comments: poor safety awareness in general  Sensation Sensation Light Touch: Appears Intact Hot/Cold: Appears Intact Proprioception: Appears Intact Stereognosis: Appears Intact Coordination Gross Motor Movements are Fluid and Coordinated: Yes Fine Motor Movements are Fluid and Coordinated: Yes Coordination and Movement Description: feel  that decreased coordination is more related to apraxia than true coordination disorder or ataxia  Motor  Motor Motor: Within Functional Limits;Motor apraxia Motor - Skilled Clinical Observations: MMT 5/5 grossly LEs and trunk, 5xSTS 19.5 seconds but likely slower due to apraxia   Mobility Bed Mobility Bed Mobility: Rolling Right;Rolling Left;Sit to Supine;Supine to Sit Rolling Right: Supervision/verbal cueing Rolling Left: Supervision/Verbal cueing Supine to Sit: Supervision/Verbal cueing Sit to Supine: Supervision/Verbal cueing Transfers Transfers: Stand to Sit Sit to Stand: Contact  Guard/Touching assist Stand to Sit: Contact Guard/Touching assist Transfer (Assistive device): None Locomotion  Gait Ambulation: Yes Gait Assistance: Contact Guard/Touching assist Gait Distance (Feet): 200 Feet Assistive device: None Gait Assistance Details: Visual cues/gestures for precautions/safety;Verbal cues for precautions/safety Gait Gait: Yes Gait Pattern: Impaired Gait Pattern: Scissoring;Narrow base of support;Poor foot clearance - left;Poor foot clearance - right;Decreased trunk rotation Gait velocity: slow Stairs / Additional Locomotion Stairs: Yes Stairs Assistance: Contact Guard/Touching assist Stair Management Technique: One rail Right Number of Stairs: 12 Height of Stairs: 6 Ramp: Contact Guard/touching assist Curb: Nurse, mental health Mobility: No  Trunk/Postural Assessment  Cervical Assessment Cervical Assessment: Within Functional Limits Thoracic Assessment Thoracic Assessment: Within Functional Limits Lumbar Assessment Lumbar Assessment: Within Functional Limits Postural Control Postural Control: Deficits on evaluation(delayed)  Balance Balance Balance Assessed: Yes Standardized Balance Assessment Standardized Balance Assessment: Dynamic Gait Index Dynamic Gait Index Level Surface: Normal Change in Gait Speed: Mild Impairment Gait with Horizontal Head Turns: Moderate Impairment Gait with Vertical Head Turns: Mild Impairment Gait and Pivot Turn: Normal Step Over Obstacle: Normal Step Around Obstacles: Mild Impairment Steps: Mild Impairment Total Score: 18 Static Sitting Balance Static Sitting - Balance Support: Feet supported;No upper extremity supported Static Sitting - Level of Assistance: 7: Independent Dynamic Sitting Balance Dynamic Sitting - Balance Support: No upper extremity supported;Feet supported Dynamic Sitting - Level of Assistance: 5: Stand by assistance Static Standing Balance Static  Standing - Balance Support: No upper extremity supported;During functional activity Static Standing - Level of Assistance: 5: Stand by assistance Dynamic Standing Balance Dynamic Standing - Balance Support: No upper extremity supported;During functional activity Dynamic Standing - Level of Assistance: 4: Min assist Extremity Assessment  RUE Assessment RUE Assessment: Not tested LUE Assessment LUE Assessment: Not tested RLE Assessment RLE Assessment: Within Functional Limits General Strength Comments: MMT grossly 5/5, appears to be more limited by motor apraxia and perseveration, cognition  LLE Assessment General Strength Comments: MMT grossly 5/5, appears to be more limited by motor apraxia and perseveration, cognition     Refer to Care Plan for Long Term Goals  Recommendations for other services: Neuropsych and Therapeutic Recreation  Pet therapy and Stress management  Discharge Criteria: Patient will be discharged from PT if patient refuses treatment 3 consecutive times without medical reason, if treatment goals not met, if there is a change in medical status, if patient makes no progress towards goals or if patient is discharged from hospital.  The above assessment, treatment plan, treatment alternatives and goals were discussed and mutually agreed upon: by patient and by family  Deniece Ree PT, DPT, CBIS  Supplemental Physical Therapist [place]    Pager [phone] Acute Rehab Office [phone]

## 2018-09-11 NOTE — Evaluation (Signed)
Speech Language Pathology Assessment and Plan  Patient Details  Name: Charles Marquez MRN: [id number] Date of Birth: 02/03/1956  SLP Diagnosis: Aphasia;Cognitive Impairments;Apraxia  Rehab Potential: Good ELOS: 7 days    Today's Date: 09/11/2018 SLP Individual Time: 0905-1000 SLP Individual Time Calculation (min): 55 min   Problem List:  Patient Active Problem List   Diagnosis Date Noted  . Hypoalbuminemia due to protein-calorie malnutrition ([place])   . Acute blood loss anemia   . S/P VP shunt   . Hydrocephalus ([place]) 08/30/2018  . Dyslipidemia   . History of CVA (cerebrovascular accident)   . Benign essential HTN   . Tachycardia   . Leukocytosis   . Hyponatremia   . Hypokalemia   . 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 Infection of ventricular shunt ([place]) 08/09/2018  . Bacterial meningitis 08/09/2018  . TIA (transient ischemic attack) 01/29/2017  . Acute encephalopathy   . Shunt malfunction 03/13/2016  . 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 Hyperlipidemia 10/20/2014  . Degenerative disc disease, lumbar 04/15/2013  . Routine general medical examination at a health care facility 07/23/2012  . DISTURBANCE OF SKIN SENSATION 10/05/2008  . HYPERLIPIDEMIA 01/01/2008  . MYCOPLASMA PNEUMONIA 01/01/2008   Past Medical History:  Past Medical History:  Diagnosis Date  . Anxiety   . Hypercholesteremia   . Stroke ([place])    tia's  . TIA (transient ischemic attack)    09.15   Past Surgical History:  Past Surgical History:  Procedure Laterality Date  . Fractured arm Left 12  . HERNIA REPAIR Right 3/12  . LAPAROSCOPIC REVISION VENTRICULAR-PERITONEAL (V-P) SHUNT N/A 08/26/2018   Procedure: LAPAROSCOPIC INSERTION VENTRICULAR-PERITONEAL (V-P) SHUNT;  Surgeon: Earnie Larsson, MD;  Location: [place];  Service: Neurosurgery;  Laterality: N/A;  . LOOP RECORDER INSERTION N/A 04/10/2017   Procedure: Loop Recorder Insertion;   Surgeon: Thompson Grayer, MD;  Location: [place] [facility];  Service: Cardiovascular;  Laterality: N/A;  . SHUNT REMOVAL Right 03/13/2016   Procedure: SHUNT REMOVAL;  Surgeon: Earnie Larsson, MD;  [facility]: [facility];  Service: Neurosurgery;  Laterality: Right;  . SHUNT REMOVAL Right 08/09/2018   Procedure: SHUNT REMOVAL With Placement of Ventricular Catheter;  Surgeon: Consuella Lose, MD;  Location: [place];  Service: Neurosurgery;  Laterality: Right;  . SHUNT REVISION Right 08/05/2018   Procedure: SHUNT REVISION;  Surgeon: Earnie Larsson, MD;  Location: [place];  Service: Neurosurgery;  Laterality: Right;  . SHUNT REVISION VENTRICULAR-PERITONEAL Left 08/26/2018   Procedure: SHUNT REVISION VENTRICULAR-PERITONEAL;  Surgeon: Earnie Larsson, MD;  Location: [place];  Service: Neurosurgery;  Laterality: Left;  . SHUNT REVISION VENTRICULAR-PERITONEAL Left 09/02/2018   Procedure: Left Occipital VP shunt revision;  Surgeon: Earnie Larsson, MD;  Location: [facility];  Service: Neurosurgery;  Laterality: Left;  [name] VASECTOMY  10/02/1997  . VENTRICULOPERITONEAL SHUNT Right 12/18/2014   Procedure: Shunt Placment - right occipital VP shunt ;  Surgeon: Charlie Pitter, MD;  Location: [place] [facility];  Service: Neurosurgery;  Laterality: Right;  Shunt Placment - right occipital VP shunt   . VENTRICULOPERITONEAL SHUNT Right 07/22/2018   Procedure: Shunt Placment right occipital;  Surgeon: Earnie Larsson, MD;  Location: [place];  Service: Neurosurgery;  Laterality: Right;  . VENTRICULOPERITONEAL SHUNT Left 08/26/2018   Procedure: LEFT SIDED VENTRICULAR-PERITONEAL SHUNT;  Surgeon: Earnie Larsson, MD;  Location: [facility] [place];  Service: Neurosurgery;  Laterality: Left;  [name] VENTRICULOSTOMY Right 08/09/2018   Procedure: VENTRICULOSTOMY;  Surgeon: Consuella Lose, MD;  [facility];  Service: Neurosurgery;  Laterality: Right;    Assessment / Plan / Recommendation Clinical Impression   Charles Marquez a 62 year old right-handed male with history of  hyperlipidemia, 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 Per chart review and wife, patient lives with spouse. He was working on Engineer, technical sales for [employer]. Patient ambulating without assistive device after a recent discharge. Two-level home 5 steps to entry. Patient with recent admission 08/04/2018 for unsteadiness of gait and nausea vomiting findings of malfunction of VP shunt with revision completed 08/05/2018 and discharged home. He was readmitted 08/08/2018 to 08/22/2018 for VP shunt infection organism isolated Klebsiella. He again underwent removal of VP shunt with placement of external ventriculostomy and completed a 2-week course of IV antibiotics. Follow-up scan reviewed showing right hydrocephalus. Per report, mildly enlarged right lateral ventricle but left ventricle and third ventricle and fourth ventricle were small. Noted generalized decline with unsteady gait noted bouts of aphasia. He was again readmitted 08/26/2018 for shunt revision ventricular peritoneal 08/26/2018 per Dr. Annette Stable. Subtends heparin added for DVT prophylaxis 08/29/2018. He was admitted to inpatient rehab services 08/30/2018 with slow but progressive gains. Patient with generalized decline more somnolence 09/01/2018. A follow-up CT of the head showed stable appearance of ventriculomegaly with unchanged positioning of ventriculostomy catheter. Follow-up neurosurgery suspect VP malfunction. He was discharged to acute care services 09/02/2018 and again underwent shunt revision per Dr. Annette Stable. Latest cranial CT scan 09/05/2018 showed nearly resolved hydrocephalus stable appearance of VP shunt. Therapies again resumed with progressive gains and patient was readmitted to inpatient rehab services to continue comprehensive rehabilitation.  SLP evaluation was completed on 09/11/2018 with the following results:  Pt presents with significant improvements in functional communication  since his discharge from CIR.  Pt now demonstrates a moderate apraxia of speech with mild-moderate underlying expressive and receptive language impairments.  [name] awareness of his verbal errors is markedly improved from his initial evaluation and he is able to recognize and correct them with mod assist multimodal cues. Pt has decreased safety awareness and right inattention which impacted his safety when ambulating around the unit and resulted in pt needing up to min assist to avoid obstacles.  Pt is also oriented to self only; however, suspect disorientation is impacted by moving to a new location overnight and I am hopeful that it will improve with more time on CIR. SLP provided education to pt and his family regarding techniques to maximize pt's functional independence for communication.  All questions were answered to pt's and family's satisfaction at this time.   Given the abovementioned deficits, pt would benefit from skilled ST while inpatient in order to maximize functional independence and reduce burden of care prior to discharge.  Anticipate that pt will need 24/7 supervision at discharge in addition to [place] follow up at next level of care.     Skilled Therapeutic Interventions          Cognitive-linguistic evaluation completed with results and recommendations reviewed with patient and family.     SLP Assessment  Patient will need skilled [place] [facility] Services during CIR admission    Recommendations  Patient destination: Home Follow up Recommendations: Outpatient SLP;24 hour supervision/assistance;Home Health SLP Equipment Recommended: None recommended by SLP    SLP Frequency 3 to 5 out of 7 days   SLP Duration  SLP Intensity  SLP Treatment/Interventions 7 days  Minumum of 1-2 x/day, 30 to 90 minutes  Cognitive remediation/compensation;Cueing hierarchy;Environmental controls;Internal/external  aids;Multimodal communication approach;Patient/family education;Functional tasks     Pain Pain Assessment Pain Scale: 0-10 Pain Score: 0-No pain  Prior Functioning Cognitive/Linguistic Baseline: Within functional limits Type of Home: House  Lives With: Spouse Available Help at Discharge: Family;Available 24 hours/day Vocation: Full time employment  Short Term Goals: Week 1: SLP Short Term Goal 1 (Week 1): STG=LTG due to ELOS   Refer to Care Plan for Long Term Goals  Recommendations for other services: None   Discharge Criteria: Patient will be discharged from SLP if patient refuses treatment 3 consecutive times without medical reason, if treatment goals not met, if there is a change in medical status, if patient makes no progress towards goals or if patient is discharged from hospital.  The above assessment, treatment plan, treatment alternatives and goals were discussed and mutually agreed upon: by patient  Emilio Math 09/11/2018, 12:39 PM

## 2018-09-11 NOTE — Evaluation (Signed)
Occupational Therapy Assessment and Plan  Patient Details  Name: Charles Marquez MRN: [id number] Date of Birth: 10-14-1956  OT Diagnosis: cognitive deficits and muscle weakness (generalized) Rehab Potential: Rehab Potential (ACUTE ONLY): Excellent ELOS: 7-9 days   Today's Date: 09/11/2018 OT Individual Time: 1000-1100 OT Individual Time Calculation (min): 60 min     Problem List:  Patient Active Problem List   Diagnosis Date Noted  . Hypoalbuminemia due to protein-calorie malnutrition ([place])   . Acute blood loss anemia   . S/P VP shunt   . Hydrocephalus ([place]) 08/30/2018  . Dyslipidemia   . History of CVA (cerebrovascular accident)   . Benign essential HTN   . Tachycardia   . Leukocytosis   . Hyponatremia   . Hypokalemia   . 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 Infection of ventricular shunt ([place]) 08/09/2018  . Bacterial meningitis 08/09/2018  . TIA (transient ischemic attack) 01/29/2017  . Acute encephalopathy   . Shunt malfunction 03/13/2016  . 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 Hyperlipidemia 10/20/2014  . Degenerative disc disease, lumbar 04/15/2013  . Routine general medical examination at a health care facility 07/23/2012  . DISTURBANCE OF SKIN SENSATION 10/05/2008  . HYPERLIPIDEMIA 01/01/2008  . MYCOPLASMA PNEUMONIA 01/01/2008    Past Medical History:  Past Medical History:  Diagnosis Date  . Anxiety   . Hypercholesteremia   . Stroke ([place])    tia's  . TIA (transient ischemic attack)    09.15   Past Surgical History:  Past Surgical History:  Procedure Laterality Date  . Fractured arm Left 12  . HERNIA REPAIR Right 3/12  . LAPAROSCOPIC REVISION VENTRICULAR-PERITONEAL (V-P) SHUNT N/A 08/26/2018   Procedure: LAPAROSCOPIC INSERTION VENTRICULAR-PERITONEAL (V-P) SHUNT;  Surgeon: Earnie Larsson, MD;  Location: [place];  Service: Neurosurgery;  Laterality: N/A;  . LOOP RECORDER INSERTION N/A  04/10/2017   Procedure: Loop Recorder Insertion;  Surgeon: Thompson Grayer, MD;  Location: [place] [facility];  Service: Cardiovascular;  Laterality: N/A;  . SHUNT REMOVAL Right 03/13/2016   Procedure: SHUNT REMOVAL;  Surgeon: Earnie Larsson, MD;  Location: [facility];  Service: Neurosurgery;  Laterality: Right;  . SHUNT REMOVAL Right 08/09/2018   Procedure: SHUNT REMOVAL With Placement of Ventricular Catheter;  Surgeon: Consuella Lose, MD;  Location: [place];  Service: Neurosurgery;  Laterality: Right;  . SHUNT REVISION Right 08/05/2018   Procedure: SHUNT REVISION;  Surgeon: Earnie Larsson, MD;  Location: [place];  Service: Neurosurgery;  Laterality: Right;  . SHUNT REVISION VENTRICULAR-PERITONEAL Left 08/26/2018   Procedure: SHUNT REVISION VENTRICULAR-PERITONEAL;  Surgeon: Earnie Larsson, MD;  Location: [place];  Service: Neurosurgery;  Laterality: Left;  . SHUNT REVISION VENTRICULAR-PERITONEAL Left 09/02/2018   Procedure: Left Occipital VP shunt revision;  Surgeon: Earnie Larsson, MD;  Location: [place];  Service: Neurosurgery;  Laterality: Left;  [name] VASECTOMY  10/02/1997  . VENTRICULOPERITONEAL SHUNT Right 12/18/2014   Procedure: Shunt Placment - right occipital VP shunt ;  Surgeon: Charlie Pitter, MD;  Location: [place] [facility];  Service: Neurosurgery;  Laterality: Right;  Shunt Placment - right occipital VP shunt   . VENTRICULOPERITONEAL SHUNT Right 07/22/2018   Procedure: Shunt Placment right occipital;  Surgeon: Earnie Larsson, MD;  Location: [place];  Service: Neurosurgery;  Laterality: Right;  . VENTRICULOPERITONEAL SHUNT Left 08/26/2018   Procedure: LEFT SIDED VENTRICULAR-PERITONEAL SHUNT;  Surgeon: Earnie Larsson, MD;  Location: [place];  Service: Neurosurgery;  Laterality: Left;  [name] VENTRICULOSTOMY Right 08/09/2018   Procedure: VENTRICULOSTOMY;  Surgeon: Consuella Lose, MD;  Location: [place];  Service: Neurosurgery;  Laterality: Right;    Assessment & Plan Clinical Impression: Charles Marquez is a 62 year old  right-handed male with history of hyperlipidemia, 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  Per chart review and wife, patient lives with spouse.  He was working on Engineer, technical sales for [employer].  Patient ambulating without assistive device after a recent discharge.  Two-level home 5 steps to entry.  Patient with recent admission 08/04/2018 for unsteadiness of gait and nausea vomiting findings of malfunction of VP shunt with revision completed 08/05/2018 and discharged home.  He was readmitted 08/08/2018 to 08/22/2018 for VP shunt infection organism isolated Klebsiella.  He again underwent removal of VP shunt with placement of external ventriculostomy and completed a 2-week course of IV antibiotics.  Follow-up scan reviewed showing right hydrocephalus.  Per report, mildly enlarged right lateral ventricle but left ventricle and third ventricle and fourth ventricle were small.  Noted generalized decline with unsteady gait noted bouts of aphasia.  He was again readmitted 08/26/2018 for shunt revision ventricular peritoneal 08/26/2018 per Dr. Annette Stable.  Subtends heparin added for DVT prophylaxis 08/29/2018.  He was admitted to inpatient rehab services 08/30/2018 with slow but progressive gains.  Patient with generalized decline more somnolence 09/01/2018.  A follow-up CT of the head showed stable appearance of ventriculomegaly with unchanged positioning of ventriculostomy catheter.  Follow-up neurosurgery suspect VP malfunction.  He was discharged to acute care services 09/02/2018 and again underwent shunt revision per Dr. Annette Stable.  Latest cranial CT scan 09/05/2018 showed nearly resolved hydrocephalus stable appearance of VP shunt.  Therapies again resumed with progressive gains and patient was readmitted to inpatient rehab services to continue comprehensive rehabilitation.   Patient transferred to CIR on 09/10/2018 .    Patient currently requires contact guard assist to close  supervision with basic self-care skills secondary to muscle weakness, decreased cardiorespiratoy endurance, impaired timing and sequencing and decreased coordination, mild perseveration, decreased attention, decreased problem solving and decreased memory and decreased standing balance, decreased postural control and decreased balance strategies.  Prior to hospitalization, patient was fully independent, working, active.  Patient will benefit from skilled intervention to increase independence with basic self-care skills prior to discharge home with care partner.  Anticipate patient will require 24 hour supervision and follow up outpatient.  OT - End of Session Activity Tolerance: Tolerates 30+ min activity with multiple rests OT Assessment Rehab Potential (ACUTE ONLY): Excellent OT Patient demonstrates impairments in the following area(s): Balance;Cognition;Endurance;Motor;Perception;Safety OT Basic ADL's Functional Problem(s): Grooming;Bathing;Dressing;Toileting OT Transfers Functional Problem(s): Toilet;Tub/Shower OT Additional Impairment(s): None OT Plan OT Intensity: Minimum of 1-2 x/day, 45 to 90 minutes OT Frequency: 5 out of 7 days OT Duration/Estimated Length of Stay: 7-9 days OT Treatment/Interventions: Balance/vestibular training;Discharge planning;Pain management;Self Care/advanced ADL retraining;Therapeutic Activities;UE/LE Coordination activities;Cognitive remediation/compensation;Functional mobility training;Patient/family education;Therapeutic Exercise;DME/adaptive equipment instruction;Neuromuscular re-education;UE/LE Strength taining/ROM;Psychosocial support OT Self Feeding Anticipated Outcome(s): Independent OT Basic Self-Care Anticipated Outcome(s): Distant S OT Toileting Anticipated Outcome(s): Distant S OT Bathroom Transfers Anticipated Outcome(s): Supervision OT Recommendation Patient destination: Home Follow Up Recommendations: Outpatient OT Equipment Recommended: To be  determined   Skilled Therapeutic Intervention Pt seen for initial evaluation and ADL training with a focus on balance, safety awareness, and following visual directions.  Explained role of OT to patient and his family.  Pt frequently nods and agrees, but he is not grasping the information being told to him as he is unable to  recall a word at a time.  Pt appears to have significantly impaired memory but it may be more of an receptive comprehension issue.  Pt eager to shower. Ambulated to bathroom with CGA and cues to sit to doff clothing over feet.  Pt able to complete toileting,shower, dressing with close S and CGA when donning pants.  Education with pt and family on OT POC, goals.  [name] practiced ambulating her spouse to the toilet and completing toileting transfers so she can A him as needed with toileting needs.    Safety plan updated to reflect this.  Pt resting in recliner with family in the room and all needs met.   OT Evaluation Precautions/Restrictions  Precautions Precautions: Fall Restrictions Weight Bearing Restrictions: No   Pain Pain Assessment Pain Scale: 0-10 Pain Score: 0-No pain Home Living/Prior Functioning Home Living Family/patient expects to be discharged to:: Private residence Living Arrangements: Spouse/significant other Available Help at Discharge: Family, Available 24 hours/day Type of Home: House Home Access: Stairs to enter [employer] of Steps: 5 Entrance Stairs-Rails: Can reach both Home Layout: Two level, 1/2 bath on main level, Bed/bath upstairs Alternate Level Stairs-Number of Steps: flight  Bathroom Shower/Tub: Tub/shower unit, Walk-in shower  Lives With: Spouse Prior Function Level of Independence: Independent with basic ADLs, Independent with transfers, Independent with homemaking with wheelchair, Independent with gait  Able to Take Stairs?: Yes Driving: Yes Vocation: Full time employment Comments: works in Engineer, technical sales for [employer];  umpires for baseball games, went home w/ wife before most recent revision, was ambulating w/o AD ADL ADL Grooming: Supervision/safety Where Assessed-Grooming: Standing at sink Upper Body Bathing: Minimal cueing Where Assessed-Upper Body Bathing: Shower Lower Body Bathing: Minimal cueing Where Assessed-Lower Body Bathing: Shower Upper Body Dressing: Setup Where Assessed-Upper Body Dressing: Chair Lower Body Dressing: Contact guard Where Assessed-Lower Body Dressing: Chair Toileting: Supervision/safety Where Assessed-Toileting: Glass blower/designer: Therapist, music Method: Magazine features editor: Curator Method: Heritage manager: Civil engineer, contracting with back Vision Baseline Vision/History: Wears glasses Wears Glasses: At all times Patient Visual Report: No change from baseline(pt's wife that pt had diplopia about 2 weeks ago but that has improved) Vision Assessment?: (vision not assessed formally due to limited ability to follow directions, no significant deficits noted during functional self care tasks.) Eye Alignment: Within Functional Limits Alignment/Gaze Preference: Within Defined Limits Perception  Comments: Pt does not demonstrate any R inattention or spatial awareness deficits during basic tasks of toileting, shower or dressing.  Praxis Praxis: Impaired Praxis Impairment Details: Perseveration(minimal perseveration during bathing tasks) Cognition Overall Cognitive Status: Impaired/Different from baseline Arousal/Alertness: Awake/alert Orientation Level: Person;Nonverbal/unable to assess(pt unable to identify he was in a hospital even with multiple choice (aphasia vs. orientation?)) Year: 2019 Month: (Unable to state) Day of Week: (Unable to state) Memory: Impaired Memory Impairment: Retrieval deficit;Storage deficit(suspected, difficult to assess due to aphasia) Immediate Memory Recall: (unable to  state words even with multiple cues) Attention: Sustained Sustained Attention: Impaired Sustained Attention Impairment: Functional basic;Verbal basic Awareness: Impaired Awareness Impairment: Emergent impairment Problem Solving: Impaired Problem Solving Impairment: Verbal basic;Functional basic Behaviors: Impulsive Safety/Judgment: Impaired Comments: right inattention  Sensation Sensation Light Touch: Appears Intact Hot/Cold: Appears Intact Proprioception: Appears Intact Stereognosis: Appears Intact Coordination Gross Motor Movements are Fluid and Coordinated: No Fine Motor Movements are Fluid and Coordinated: Yes Coordination and Movement Description: decreased coordination of LE standing balance activities Motor  Motor Motor - Skilled Clinical Observations: trunk muscle weakness, LE weakness with  dynamic standing balance Mobility    CGA for transfers and ambulation within the room Trunk/Postural Assessment  Cervical Assessment Cervical Assessment: Within Functional Limits Thoracic Assessment Thoracic Assessment: Within Functional Limits Lumbar Assessment Lumbar Assessment: Within Functional Limits Postural Control Postural Control: Deficits on evaluation(delayed )  Balance Static Sitting Balance Static Sitting - Level of Assistance: 7: Independent Dynamic Sitting Balance Dynamic Sitting - Level of Assistance: 5: Stand by assistance Static Standing Balance Static Standing - Level of Assistance: 5: Stand by assistance Dynamic Standing Balance Dynamic Standing - Level of Assistance: 4: Min assist Extremity/Trunk Assessment RUE Assessment RUE Assessment: Within Functional Limits LUE Assessment LUE Assessment: Within Functional Limits     Refer to Care Plan for Long Term Goals  Recommendations for other services: None    Discharge Criteria: Patient will be discharged from OT if patient refuses treatment 3 consecutive times without medical reason, if treatment  goals not met, if there is a change in medical status, if patient makes no progress towards goals or if patient is discharged from hospital.  The above assessment, treatment plan, treatment alternatives and goals were discussed and mutually agreed upon: by patient and by family  Keyonda Bickle 09/11/2018, 1:00 PM

## 2018-09-11 NOTE — Progress Notes (Signed)
Initial Nutrition Assessment  DOCUMENTATION CODES:   Non-severe (moderate) malnutrition in context of acute illness/injury  INTERVENTION:   - Continue Ensure Enlive po BID, each supplement provides 350 kcal and 20 grams of protein (vanilla or strawberry flavors)  - PM snack  - 30 ml Prostat BID, each supplement provides 100 kcals and 15 grams protein  - Agree with Regular diet order  NUTRITION DIAGNOSIS:   Moderate Malnutrition related to acute illness (VP shunt placement and revisions, Klebsiella growth) as evidenced by mild muscle depletion, mild fat depletion, percent weight loss (8.5% weight loss in 1 month).  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 Pt had VP shunt removed due to klebsiella growth and EVD placed on 9/27. New VP shunt placed on 10/14 and was admitted to CIR where he had worsening level of consciousness and enlarging right lateral ventricle. Shunt revised on 10/21 and pt re-admitted to CIR on 10/29. PMH significant for hyperlipidemia, loop recorder insertion 04/10/17, and right occipital VP shunt placement in 2016.  Spoke with pt and family members at bedside.  Pt reports having a great appetite and eating 100% of breakfast meal. Pt also reports enjoying Ensure Enlive oral nutrition supplements and is willing to continue them BID. RD to order afternoon snack for pt (yogurt and graham crackers).  [name] family members with various questions regarding "brain food," blood thinner foods, and muscle wasting. All questions answered. Provided education regarding importance of Regular diet and pt choosing foods that appeal to him as his needs are increased due to healing and  therapies. Family and pt expressed understanding.  Pt notes that he has lost 20 lbs over the past 2 months. Pt reports his UBW as 192-194 lbs and that he last weighed this at the beginning of September. Pt reports his lowest weight was 171 lbs and that he is slowly gaining weight and is now back to 173 lbs.  Per weight history in chart, pt has lost 16 lbs over the past 1 month. This is an 8.5% weight loss which is significant for timeframe.  Pt denies any nausea at this time. Pt denies any issues chewing or swallowing.  Meal Completion: 100%  Medications reviewed and include: Pepcid 20 mg BID, Ensure Enlive BID, Pro-stat 30 ml BID, Prosight daily, Senokot-S daily  Labs reviewed: BUN 21 (H)  NUTRITION - FOCUSED PHYSICAL EXAM:    Most Recent Value  Orbital Region  Mild depletion  Upper Arm Region  Mild depletion  Thoracic and Lumbar Region  No depletion  Buccal Region  No depletion  Temple Region  Mild depletion  Clavicle Bone Region  No depletion  Clavicle and Acromion Bone Region  No depletion  Scapular Bone Region  Mild depletion  Dorsal Hand  No depletion  Patellar Region  Mild depletion  Anterior Thigh Region  Mild depletion  Posterior Calf Region  Mild depletion  Edema (RD Assessment)  None  Hair  Reviewed  Eyes  Reviewed  Mouth  Reviewed  Skin  Reviewed  Nails  Reviewed       Diet Order:   Diet Order            Diet regular Room service  appropriate? Yes; Fluid consistency: Thin  Diet effective now              EDUCATION NEEDS:   Education needs have been addressed  Skin:  Skin Assessment: Skin Integrity Issues: Incisions: head  Last BM:  10/28  Height:   Ht Readings from Last 1 Encounters:  09/10/18 5\' 11"  (1.803 m)    Weight:   Wt Readings from Last 1 Encounters:  09/10/18 78.9 kg    Ideal Body Weight:  78.18 kg  BMI:  Body mass index is 24.26 kg/m.  Estimated Nutritional Needs:   Kcal:  2100-2300  Protein:  110-125 grams  Fluid:   >/= 2.1 L    Earma Reading, MS, RD, LDN Inpatient Clinical Dietitian Pager: [phone] Weekend/After Hours: [phone]

## 2018-09-11 NOTE — Progress Notes (Signed)
Occupational Therapy Session Note  Patient Details  Name: Charles Marquez MRN: [id number] Date of Birth: 09/26/1956  Today's Date: 09/11/2018 OT Individual Time: 1430-1500 OT Individual Time Calculation (min): 30 min    Short Term Goals: Week 1:  OT Short Term Goal 1 (Week 1): STGs = LTGs  Skilled Therapeutic Interventions/Progress Updates:  Pt seen for OT session focusing on attention to task, cognitive remediation and functional ambulation. Pt sitting up in recliner upon arrival with wife present, pt agreeable to tx session and denying pain. [name] attended therapy session, actively involved and cuing appropriately throughout cognitive task. He ambulated throughout unit with close supervision- occasional min A, using R handrail in hallway and several instances of running into environmental obstacles in crowded hallway. Completed cognitive table top activity in highly stimulating environment. Pt initially requiring total A cues for jumbled word puzzle, total A cuing to recall appropriate letters and to copy word directly from sheet. On second trial pt able to complete with max A cuing. Pt becoming noticably frustrated with difficulty of task requiring encouragement throughout. He then completed standing ball toss game, with emphasis on turn taking and safety awareness when picking items up from floor, cuing required throughout to slow down rate of speed and for safety awareness.  Pt returned to room at end of session, max- total A cuing to immediately recall room number, path finding and use of environmental aids to assist. Pt left seated in recliner at end of session with wife present.   Therapy Documentation Precautions:  Precautions Precautions: Fall Restrictions Weight Bearing Restrictions: No Pain: Pain Assessment Pain Scale: 0-10 Pain Score: 0-No pain   Therapy/Group: Individual Therapy  Malary Aylesworth L 09/11/2018, 3:11 PM

## 2018-09-11 NOTE — Progress Notes (Signed)
[place] PHYSICAL MEDICINE & REHABILITATION PROGRESS NOTE  Subjective/Complaints: Patient seen laying in bed this morning.  Wife at bedside who states patient slept well overnight.  ROS: Appears to deny CP, SOB, nausea, vomiting, diarrhea  Objective: Vital Signs: Blood pressure 115/85, pulse 72, temperature 98.1 F (36.7 C), resp. rate 18, height 5\' 11"  (1.803 m), weight 78.9 kg, SpO2 97 %. No results found. Recent Labs    09/11/18 0543  WBC 6.1  HGB 11.6*  HCT 37.2*  PLT 189   Recent Labs    09/11/18 0543  NA 139  K 4.7  CL 104  CO2 28  GLUCOSE 100*  BUN 21*  CREATININE 0.86  CALCIUM 8.8*    Physical Exam: BP 115/85 (BP Location: Left Arm)   Pulse 72   Temp 98.1 F (36.7 C)   Resp 18   Ht 5\' 11"  (1.803 m)   Wt 78.9 kg   SpO2 97%   BMI 24.26 kg/m  Constitutional: He appearswell-developedand well-nourished.No distress.  HENT: Normocephalic.  Atraumatic. Eyes:EOMI.  No discharge.  Cardiovascular:Normal rate and rhythm. Respiratory:Effort normal.  Clear.  ZO:XWRUE sounds normal.  He exhibitsno distension.  Neurological: Alert and oriented x1.  Expressive aphasia Motor: LUE/LLE: 5/5 proximal distal R [place]: 4+/5 proximal more distal RLE: 4/5 proximal to distal  Psychiatric: He has anormal mood and affect. Hisbehavior is normal.  Assessment/Plan: 1. 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  Physiatrist is providing close team supervision and 24 hour management of active medical problems listed below.  Physiatrist and rehab team continue to assess barriers to discharge/monitor patient progress toward functional and medical goals  Care Tool:  Bathing              Bathing assist       Upper Body Dressing/Undressing Upper body dressing        Upper body assist      Lower Body  Dressing/Undressing Lower body dressing            Lower body assist       Toileting Toileting    Toileting assist       Transfers Chair/bed transfer  Transfers assist           Locomotion Ambulation   Ambulation assist              Walk 10 feet activity   Assist           Walk 50 feet activity   Assist           Walk 150 feet activity   Assist           Walk 10 feet on uneven surface  activity   Assist           Wheelchair     Assist               Wheelchair 50 feet with 2 turns activity    Assist            Wheelchair 150 feet activity     Assist            Medical Problem List and Plan: 1.Unsteady gait with aphasiasecondary to ventriculoperitoneal shunt malfunction status post revision 09/02/2018  Begin CIR  Notes reviewed- VP shunt malfunction on several occasions, with revisions, images reviewed- CT head with nearly resolved hydrocephalus, labs reviewed 2. DVT Prophylaxis/Anticoagulation: SCDs. Monitor  for any signs of DVT 3. Pain Management:Ultram as needed 4. Mood:Provide emotional support 5. Neuropsych: This patientisnot capable of making decisions on hisown behalf. 6. Skin/Wound Care:Routine skin checks 7. Fluids/Electrolytes/Nutrition:Routine in and outs   BMP within acceptable range on 10/30 8.Constipation. Laxative assistance 9.Hyperlipidemia. Lipitor 10.  Acute blood loss anemia  Hemoglobin 11.6 on 10/30  Continue to monitor 11.  Hypoalbuminemia  Supplement initiated on 10/30   LOS: 1 days A FACE TO FACE EVALUATION WAS PERFORMED  Ankit Karis Juba 09/11/2018, 8:37 AM

## 2018-09-11 NOTE — Progress Notes (Signed)
Social Work  Gurinder Toral, Lemar Livings, LCSW  Social Worker  Physical Medicine and Rehabilitation  Progress Notes  Signed  Date of Service:  09/02/2018  3:08 PM       Related encounter: Admission (Discharged) from 08/30/2018 in [facility]      Signed        Show:Clear all [x] Manual[x] Template[] Copied  Added by: [x] Omere Marti, Lemar Livings, LCSW  [] Hover for details Social Work  Social Work Assessment and Plan   Patient Details  Name: Charles Marquez MRN: [id number] Date of Birth: 02-04-1956   Today's Date: 09/02/2018   Problem List:      Patient Active Problem List    Diagnosis Date Noted  . Acute blood loss anemia    . S/P VP shunt    . Hydrocephalus (HCC) 08/30/2018  . Dyslipidemia    . History of CVA (cerebrovascular accident)    . Benign essential HTN    . Tachycardia    . Leukocytosis    . Hyponatremia    . Hypokalemia    . 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 Bacterial meningitis 08/09/2018  . TIA (transient ischemic attack) 01/29/2017  . Acute encephalopathy    . Shunt malfunction 03/13/2016  . 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 Hyperlipidemia 10/20/2014  . Degenerative disc disease, lumbar 04/15/2013  . Routine general medical examination at a health care facility 07/23/2012  . DISTURBANCE OF SKIN SENSATION 10/05/2008  . HYPERLIPIDEMIA 01/01/2008  . MYCOPLASMA PNEUMONIA 01/01/2008    Past Medical History:      Past Medical History:  Diagnosis Date  . Anxiety    . Hypercholesteremia    . Stroke (HCC)      tia's  . TIA (transient ischemic attack)      09.15    Past Surgical History:       Past Surgical History:  Procedure Laterality Date  . Fractured arm Left 12  . HERNIA REPAIR Right 3/12  . LAPAROSCOPIC REVISION VENTRICULAR-PERITONEAL (V-P) SHUNT N/A 08/26/2018    Procedure: LAPAROSCOPIC  INSERTION VENTRICULAR-PERITONEAL (V-P) SHUNT;  Surgeon: Julio Sicks, MD;  Location: [facility] OR;  Service: Neurosurgery;  Laterality: N/A;  . LOOP RECORDER INSERTION N/A 04/10/2017    Procedure: Loop Recorder Insertion;  Surgeon: Hillis Range, MD;  Location: [facility] INVASIVE CV LAB;  Service: Cardiovascular;  Laterality: N/A;  . SHUNT REMOVAL Right 03/13/2016    Procedure: SHUNT REMOVAL;  Surgeon: Julio Sicks, MD;  Location: [facility] NEURO ORS;  Service: Neurosurgery;  Laterality: Right;  . SHUNT REMOVAL Right 08/09/2018    Procedure: SHUNT REMOVAL With Placement of Ventricular Catheter;  Surgeon: Lisbeth Renshaw, MD;  Location: [facility] OR;  Service: Neurosurgery;  Laterality: Right;  . SHUNT REVISION Right 08/05/2018    Procedure: SHUNT REVISION;  Surgeon: Julio Sicks, MD;  Location: [facility] OR;  Service: Neurosurgery;  Laterality: Right;  . SHUNT REVISION VENTRICULAR-PERITONEAL Left 08/26/2018    Procedure: SHUNT REVISION VENTRICULAR-PERITONEAL;  Surgeon: Julio Sicks, MD;  Location: [facility] OR;  Service: Neurosurgery;  Laterality: Left;  [name] VASECTOMY   10/02/1997  . VENTRICULOPERITONEAL SHUNT Right 12/18/2014    Procedure: Shunt Placment - right occipital VP shunt ;  Surgeon: Temple Pacini, MD;  Location: [facility] NEURO ORS;  Service: Neurosurgery;  Laterality: Right;  Shunt Placment - right occipital VP shunt   . VENTRICULOPERITONEAL SHUNT Right 07/22/2018    Procedure: Shunt Placment right occipital;  Surgeon:  Julio Sicks, MD;  Location: [facility] OR;  Service: Neurosurgery;  Laterality: Right;  . VENTRICULOPERITONEAL SHUNT Left 08/26/2018    Procedure: LEFT SIDED VENTRICULAR-PERITONEAL SHUNT;  Surgeon: Julio Sicks, MD;  Location: [facility] OR;  Service: Neurosurgery;  Laterality: Left;  [name] VENTRICULOSTOMY Right 08/09/2018    Procedure: VENTRICULOSTOMY;  Surgeon: Lisbeth Renshaw, MD;  Location: [facility] OR;  Service: Neurosurgery;  Laterality: Right;    Social History:  reports that he has never smoked. He has never used smokeless tobacco. He reports that  he drinks alcohol. He reports that he does not use drugs.   Family / Support Systems Marital Status: Married Patient Roles: Spouse, Parent, Other (Comment)(employee) Spouse/Significant Other: Kim [phone]-home  [phone]-cell Children: Michelle Osabel-daughter [phone]-cell  Local son also Other Supports: Friends and church members Anticipated Caregiver: Wife Ability/Limitations of Caregiver: Wife is taking a FMLA from school Caregiver Availability: 24/7 Family Dynamics: Close knit family they have two children who are local and involved and supportive. They also have friends and church members who visit and provide support.   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 Had been laided off [employer] 03/2018 Legal Hisotry/Current Legal Issues: Has health insurance until 09/2018 from [employer] then wife will need to look into [employer] or the marketplace insuranc euntil she can add him to hers in 11/2018 Guardian/Conservator: None-according to MD pt is capable of making his own decisions while here. Wife may need to get POA to talk with insurance people    Abuse/Neglect Abuse/Neglect Assessment Can Be Completed: Yes Physical Abuse: Denies Verbal Abuse: Denies Sexual Abuse: Denies Exploitation of patient/patient's resources: Denies Self-Neglect: Denies   Emotional Status [name] affect, behavior adn adjustment status: Pt is slow in his responses and is extremely tired from therapies. Wife answered the questions and provided information. Pt was doing well until 07/2018 when shunt malfunctioned and he had to have it replaced. It has been back and forth in the hospital since this time. He is trying to remain independent and recover from this. Recent  Psychosocial Issues: health issues causing to have VP shunt and stroke he had in 2018. Constant back and forth since 07/2018 Pyschiatric History: Hx of anxiety takes medications for this and finds them helpful. Would benefit from seeing neuro-psych while here due to the long road he has been on since 2016 when first had shunt. Will make referral for him to be seen while here Substance Abuse History: No issues   Patient / Family Perceptions, Expectations & Goals Pt/Family understanding of illness & functional limitations: Pt and wife can explain his procedures and the treatment plan going forward. Wife reports he has been having the shunt adjusted and actually was adjusted last night since he was not acting right and MD was called. She hopes it can be set so pt can actually being the recvoery process. Wife is very close wiht neuro-surgeon and talks to him daily. Premorbid pt/family roles/activities: Husband, father, employee, friend, church member, etc Anticipated changes in roles/activities/participation: resume Pt/family expectations/goals: Pt states: " I want to get better and get home."  Wife states: " I hope he can improve and get better without having set backs."   [employer]: None Premorbid Home Care/DME Agencies: None Transportation available at discharge: Wife Resource referrals recommended:  Neuropsychology, Support group (specify)   Discharge Planning Living Arrangements: Spouse/significant other Support Systems: Spouse/significant other, Children, Church/faith community, Friends/neighbors Type of Residence: Private residence Insurance Resources: Media planner (specify)([place] of [place]) Financial Resources: Employment, Garment/textile technologist Screen Referred: No Living Expenses: Database administrator Management: Spouse Does the patient have any problems obtaining your medications?: No Home Management: Wife Patient/Family Preliminary Plans: Return home with wife  who has taken a FMLA from school to provide care to pt. Currently his insurance will only cover 7 days but will need to push them to cover more since therapy team wants him to stay here 10-15 days. He did have set back with his shunt still not functioning well. Discussed getting insurance coverage for after Nov when his insurance terms.  Sw Barriers to Discharge: Medical stability Sw Barriers to Discharge Comments: Seems to need daily medical management and stability Social Work Anticipated Follow Up Needs: HH/OP, Support Group   Clinical Impression Pleasant tired gentleman who is working hard in his therapies and trying to recover from his medical issues. Wife is stressed understandably so with trying to keep their household going and provide support to husband, along with dealing with the insurance issues. Will assist with insurance options, wife to look into cobra options. Will work on discharge needs and provide support to wife. Will ask neuro-psych to see pt while here.    Lucy Chris 09/02/2018, 3:08 PM            Patient ID: Celene Squibb, male   DOB: 08-29-1956, 62 y.o.   MRN: [id number]

## 2018-09-11 NOTE — Progress Notes (Signed)
Overall stable.  No new issues or problems.  Continue rehab efforts.

## 2018-09-11 NOTE — Care Management Note (Signed)
Inpatient Rehabilitation Center Individual Statement of Services  Patient Name:  Charles Marquez  Date:  09/11/2018  Welcome to the Inpatient Rehabilitation Center.  Our goal is to provide you with an individualized program based on your diagnosis and situation, designed to meet your specific needs.  With this comprehensive rehabilitation program, you will be expected to participate in at least 3 hours of rehabilitation therapies Monday-Friday, with modified therapy programming on the weekends.  Your rehabilitation program will include the following services:  Physical Therapy (PT), Occupational Therapy (OT), Speech Therapy (ST), 24 hour per day rehabilitation nursing, Therapeutic Recreaction (TR), Neuropsychology, Case Management (Social Worker), Rehabilitation Medicine, Nutrition Services and Pharmacy Services  Weekly team conferences will be held on Wednesday to discuss your progress.  Your Social Worker will talk with you frequently to get your input and to update you on team discussions.  Team conferences with you and your family in attendance may also be held.  Expected length of stay: 7 days  Overall anticipated outcome: supervision level  Depending on your progress and recovery, your program may change. Your Social Worker will coordinate services and will keep you informed of any changes. Your Social Worker's name and contact numbers are listed  below.  The following services may also be recommended but are not provided by the Inpatient Rehabilitation Center:   Driving Evaluations  Home Health Rehabiltiation Services  Outpatient Rehabilitation Services  Vocational Rehabilitation   Arrangements will be made to provide these services after discharge if needed.  Arrangements include referral to agencies that provide these services.  Your insurance has been verified to be:  BCBS Your primary doctor is:  Antony Haste  Pertinent information will be shared with your doctor and  your insurance company.  Social Worker:  Dossie Der, SW [phone] or ([phone]  Information discussed with and copy given to patient by: Lucy Chris, 09/11/2018, 3:39 PM

## 2018-09-11 NOTE — Progress Notes (Signed)
Patient information reviewed and entered into eRehab system by Mirranda Monrroy M. Rosemond Lyttle, M.A., CCC/SLP, PPS Coordinator.  Information including medical coding, functional ability and quality indicators will be reviewed and updated through discharge.   

## 2018-09-12 ENCOUNTER — Inpatient Hospital Stay (HOSPITAL_COMMUNITY): Payer: BLUE CROSS/BLUE SHIELD | Admitting: Occupational Therapy

## 2018-09-12 ENCOUNTER — Inpatient Hospital Stay (HOSPITAL_COMMUNITY): Payer: BLUE CROSS/BLUE SHIELD | Admitting: Physical Therapy

## 2018-09-12 ENCOUNTER — Inpatient Hospital Stay (HOSPITAL_COMMUNITY): Payer: BLUE CROSS/BLUE SHIELD | Admitting: Speech Pathology

## 2018-09-12 DIAGNOSIS — K5901 Slow transit constipation: Secondary | ICD-10-CM

## 2018-09-12 NOTE — Patient Care Conference (Signed)
Inpatient RehabilitationTeam Conference and Plan of Care Update Date: 09/11/2018   Time: 2:55 PM    Patient Name: Charles Marquez      Medical Record Number: [id number]  Date of Birth: 1956/06/03 Sex: Male         Room/Bed: 4M10C/4M10C-01 Payor Info: Payor: BLUE CROSS BLUE [employer]IELD / Plan: BCBS OTHER / Product Type: *No Product type* /    Admitting Diagnosis: Go shunt revision  Admit Date/Time:  09/10/2018  4:41 PM Admission Comments: No comment available   Primary Diagnosis:  <principal problem not specified> Principal Problem: <principal problem not specified>  Patient Active Problem List   Diagnosis Date Noted  . Slow transit constipation   . Hypoalbuminemia due to protein-calorie malnutrition (HCC)   . Acute blood loss anemia   . S/P VP shunt   . Hydrocephalus (HCC) 08/30/2018  . Dyslipidemia   . History of CVA (cerebrovascular accident)   . Benign essential HTN   . Tachycardia   . Leukocytosis   . Hyponatremia   . Hypokalemia   . 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 Bacterial meningitis 08/09/2018  . TIA (transient ischemic attack) 01/29/2017  . Acute encephalopathy   . Shunt malfunction 03/13/2016  . 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 Hyperlipidemia 10/20/2014  . Degenerative disc disease, lumbar 04/15/2013  . Routine general medical examination at a health care facility 07/23/2012  . DISTURBANCE OF SKIN SENSATION 10/05/2008  . HYPERLIPIDEMIA 01/01/2008  . MYCOPLASMA PNEUMONIA 01/01/2008    Expected Discharge Date: Expected Discharge Date: 09/18/18  Team Members Present: Physician leading conference: Dr. Maryla Morrow Social Worker Present: Dossie Der, LCSW Nurse Present: Ronny Bacon, RN PT Present: Alyson Reedy, PT OT Present: Johnsie Cancel, OT SLP Present: Jackalyn Lombard, SLP PPS Coordinator present : Tora Duck, RN, CRRN     Current  Status/Progress Goal Weekly Team Focus  Medical   Unsteady gait with aphasia secondary to ventriculoperitoneal shunt malfunction status post revision 09/02/2018  Improve mobility, safety, cognition  See above   Bowel/Bladder   cont/incont b/b per previous charting; condom cath at night per family request; lbm 10/28  cont of b/b with min assist  assess q shift and prn; timed toileting; hourly rounding; encourage patient and family to d/c condom cath at night   Swallow/Nutrition/ Hydration             ADL's   CGA to close supervision with all self care  set up to distant S with all self care  ADL training, dynamic balance, cognition, pt/family education   Mobility   min guard transfers, gait no AD, poor safety awareness, impulsivity  S overall  dynamic balance, reactive/anticipatory balance strategies, activity tolerance, safety awareness   Communication   min-mod assist   min assist   improving awareness of verbal errors and ability to correct   Safety/Cognition/ Behavioral Observations  min assist   supervision   safety awareness, awareness of obstacles on his right side    Pain   no c/o pain  <2 out of 10  assess q shift and prn; medicate prn   Skin   incision to L head (OTA; healing); mult incision sites to abdomen (OTA; healing/healed)  free from infection/breakdown with min assist  assess q shift and prn      *See Care Plan and progress notes for long and short-term goals.     Barriers to Discharge  Current Status/Progress Possible  Resolutions Date Resolved   Physician    Medical stability     See above  Therapies      Nursing  Incontinence               PT                    OT                  SLP                SW                Discharge Planning/Teaching Needs:    Home with wife who has taken a leave from work to provide supervision to pt. Here daily and assists with his therapies.     Team Discussion:  New evaluation-goals set for supervision level for  safety and cognition issues. Moves very quickly and is impulsive. Incontinent at night time wearing a condom cath-per wife's request. r-inattention deficits. Discussed OP therapies with both at discharge.  Revisions to Treatment Plan:  DC 11/6    Continued Need for Acute Rehabilitation Level of Care: The patient requires daily medical management by a physician with specialized training in physical medicine and rehabilitation for the following conditions: Daily direction of a multidisciplinary physical rehabilitation program to ensure safe treatment while eliciting the highest outcome that is of practical value to the patient.: Yes Daily medical management of patient stability for increased activity during participation in an intensive rehabilitation regime.: Yes Daily analysis of laboratory values and/or radiology reports with any subsequent need for medication adjustment of medical intervention for : Neurological problems;Post surgical problems   I attest that I was present, lead the team conference, and concur with the assessment and plan of the team.   Lucy Chris 09/12/2018, 1:37 PM

## 2018-09-12 NOTE — Progress Notes (Signed)
Occupational Therapy Session Note  Patient Details  Name: Charles Marquez MRN: [id number] Date of Birth: 20-Jul-1956  Today's Date: 09/12/2018 OT Individual Time: 1100-1200 OT Individual Time Calculation (min): 60 min    Short Term Goals: Week 1:  OT Short Term Goal 1 (Week 1): STGs = LTGs  Skilled Therapeutic Interventions/Progress Updates:    Pt seen for OT session focusing on re-orientation and ADL re-training. Pt sitting up in chair upon arrival with wife and family members present. Pt requiring mod cuing to correctly recall name of family members.  Pt not orientated to x4, requiring mod-max questioning cues to re-orient and total A to recall events of previous sessions of immediate and long term events. Updated memory journal thorughout session.  He gathered clothing items and completed bathing task at shower level. Required max cuing throughout for sequencing of entire bathing/dressing tasks. He dressed seated in chair, sit<>stand with guarding assist before completing grooming tasks standing at sink.  He ambulated to ADL apartment, requiring max A for path finding. Reviewed orientation questions, now requiring mod cuing. Wrote signs for orientation to have in room. Wrote directions for how to path find back to room, min cuing required in addition to written directions.  Pt required max cuing at end of session to recall events of this session for memory journal. Left seated in chair with all needs in reach.   Therapy Documentation Precautions:  Precautions Precautions: Fall Precaution Comments: short memory, poor safety awareness and recall/carryover  Restrictions Weight Bearing Restrictions: No Pain:   No/denies pain   Therapy/Group: Individual Therapy  Kioni Stahl L 09/12/2018, 7:59 AM

## 2018-09-12 NOTE — Progress Notes (Signed)
   Providing Compassionate, Quality Care - Together   Subjective: Patient's wife reports patient still has trouble with short-term memory. She says that Charles Marquez has regained most of his strength. He still reports some trouble with balance.   Objective: Vital signs in last 24 hours: Temp:  [97.4 F (36.3 C)-98.1 F (36.7 C)] 98.1 F (36.7 C) (10/30 1940) Pulse Rate:  [85-88] 85 (10/30 1940) Resp:  [14-18] 18 (10/30 1940) BP: (119-130)/(89-91) 130/91 (10/30 1940) SpO2:  [99 %] 99 % (10/30 1940)  Intake/Output from previous day: 10/30 0701 - 10/31 0700 In: 1560 [P.O.:1560] Out: -  Intake/Output this shift: No intake/output data recorded.  Neurologic: Mental status: alertness: alert, orientation: person, was able to get place and city with coaching. Could not get month, date, or that it's Halloween Motor: MAE, Strength 5/5 BUE and BLE Wound: Clean, dry, and intact at left temporal incision and left abdominal incision. Both sites well-approximated.  Lab Results: Recent Labs    09/11/18 0543  WBC 6.1  HGB 11.6*  HCT 37.2*  PLT 189   BMET Recent Labs    09/11/18 0543  NA 139  K 4.7  CL 104  CO2 28  GLUCOSE 100*  BUN 21*  CREATININE 0.86  CALCIUM 8.8*    Assessment/Plan: 62 year-old male with complicated shunt history, admitted to CIR on 10/29. Strength and memory improving gradually. Shunt pumps and refills easily.   LOS: 2 days    Charles Marquez will work with therapies in CIR over the next week. Anticipated discharge date is 09/18/2018. Home therapies will be arranged prior to discharge.   Val Eagle, DNP, AGNP-C Nurse Practitioner  [facility] [address], [address], [place], [place] [address] P: [phone]    F: [phone]  09/12/2018, 10:15 AM

## 2018-09-12 NOTE — Progress Notes (Signed)
Speech Language Pathology Daily Session Note  Patient Details  Name: Charles Marquez MRN: [id number] Date of Birth: October 13, 1956  Today's Date: 09/12/2018 SLP Individual Time: 1035-1100; 1400-1445 SLP Individual Time Calculation (min): 25 min; 45 min   Short Term Goals: Week 1: SLP Short Term Goal 1 (Week 1): STG=LTG due to ELOS   Skilled Therapeutic Interventions:  Session 1:  Pt was seen for skilled ST targeting cognitive-linguistic goals.  Pt was received sitting upright in recliner, holding his baby granddaughter with his wife and daughter at bedside.  When asked what he goes by, he responded "Pop, usually" which family reports to be accurate.  Min cues needed for safety when ambulating to treatment room due to right inattention.  Pt was able to generate phrase level descriptions of people, objects, and actions in structured picture description tasks with min-mod assist verbal cues.  Pt presents with complaints of feeling "worse" today in regards to confusion; however, suspect this is due to his increasing awareness of his current deficits and improving recall of daily information.  Pt seemed encouraged when this was explained to him and he also seemed pleased by his improved communication during functional tasks.  Mod-max cues needed for route recall when ambulating back to his room.  Pt left in chair with family at bedside.  Continue per current plan of care.    Session 2:  Pt was seen for skilled ST targeting cognitive-linguistic goals.  Pt was received sitting up in chair with wife at bedside, awake, alert, and agreeable to participating in therapy.  SLP facilitated the session with therapeutic conversations regarding personally meaningful topics (sports).  Pt was able to answer questions regarding his favorite sports teams and relevant players on each team with min-mod written and verbal cues.  Pt was also able to complete very basic mental math to determine the difference in two teams  respective records with increased time.  Halfway through the session, pt's demeanor/affect changed slightly and he was noted to become more distracted/perturbed by environmental noise.  He demonstrated good awareness of said change and stated "I'm getting confused."  SLP offered reassurance and initiated skilled education regarding use of environmental aids to facilitate orientation to place, date, and situation.  SLP provided pt with a handout describing the role of each member of his care team and talked about incorporating this information with use of name badges for reorientation.  Also discussed using signage around the unit to assist in wayfinding when ambulating to and from his room for treatment.  Pt needed max cues for return demonstration of use of signs but found his room more easily today in comparison to earlier therapy session.  Pt was left in room with wife at bedside.  Continue per current plan of care.      Pain Pain Assessment Pain Scale: 0-10 Pain Score: 0-No pain  Therapy/Group: Individual Therapy  Kura Bethards, Melanee Spry 09/12/2018, 12:08 PM

## 2018-09-12 NOTE — Progress Notes (Signed)
Physical Therapy Session Note  Patient Details  Name: Charles Marquez MRN: [id number] Date of Birth: [birth year]  Today's Date: 09/12/2018 PT Individual Time: 0800-0915 PT Individual Time Calculation (min): 75 min   Short Term Goals: Week 1:  PT Short Term Goal 1 (Week 1): STG = LTG due to ELOS   Skilled Therapeutic Interventions/Progress Updates:    Pt received sitting in recliner with with wife Charles Marquez) present, who attended today's session. Denies pain and agreeable to treatment. S sit>stand and S stand>pivot transfers throughout session. CGA ambulation x 200' to/from rehab gym and CGA ambulation throughout session. The session focused on dynamic balance. Multiple CGA gait trials with CGA for motor planning, LE coordination, foot clearance, and balance: forward step-to in floor ladder, side-stepping step-to L/R in floor ladder, forward in/out in floor ladder, cone weaving, and stepping over cones. Multiple CGA gait trials with horizontal and vertical head turns, change in gait speed, and sudden stops for balance. CGA standing on airex (normal BOS and progressing to narrow BOS): throwing/catching ball in/out of BOS, shooting basketball (pt hobby), sit>stands without UE support, perturbations in parallel bars without UE support, and squats in parallel bars without UE support; CGA standing on anterior/posterior oriented rockerboard in parallel bars to promote appropiate ankle strategies. Pt able to self-correct via stepping/ankle strategies anteriorly and laterally, but requires minA to correct posterior LOBs. CGA step-ups on 6 inch step for balance, endurance,motor planning, and LE strengthening. CGA ambulation return to room to toilet (CGA stand pivot toilet transfer); pt had bowel movement and able to complete hygiene/dressing without assist. Dual task: standing on airex with toe taps to colored/numbered circles for balance, cognition, short-term memory, and motor planning/coordination; pt  demonstrated difficulty with the task; likely too difficult due to cognitive processing difficulties and the amount of tasks being asked of him at once; tasks needs to be broken down into parts for future sessions. Pt required consistent cues/reminders with simple commands throughout the session for task completion due to poor short term memory and cognitive processing issues. Pt remained sitting in recliner with wife present at the end of the session.   Therapy Documentation Precautions:  Precautions Precautions: Fall Precaution Comments: short memory, poor safety awareness and recall/carryover  Restrictions Weight Bearing Restrictions: No Pain: Pain Assessment Pain Scale: 0-10 Pain Score: 0-No pain    Therapy/Group: Individual Therapy  Charles Marquez 09/12/2018, 12:19 PM

## 2018-09-12 NOTE — Progress Notes (Signed)
[place] PHYSICAL MEDICINE & REHABILITATION PROGRESS NOTE  Subjective/Complaints: Patient seen sitting up in his chair this morning wife at bedside.  Per wife, patient slept well overnight.  Wife notes?  Visual symptoms this morning.  ROS: Appears to deny CP, SOB, nausea, vomiting, diarrhea  Objective: Vital Signs: Blood pressure (!) 130/91, pulse 85, temperature 98.1 F (36.7 C), temperature source Oral, resp. rate 18, height 5\' 11"  (1.803 m), weight 78.9 kg, SpO2 99 %. No results found. Recent Labs    09/11/18 0543  WBC 6.1  HGB 11.6*  HCT 37.2*  PLT 189   Recent Labs    09/11/18 0543  NA 139  K 4.7  CL 104  CO2 28  GLUCOSE 100*  BUN 21*  CREATININE 0.86  CALCIUM 8.8*    Physical Exam: BP (!) 130/91 (BP Location: Left Arm)   Pulse 85   Temp 98.1 F (36.7 C) (Oral)   Resp 18   Ht 5\' 11"  (1.803 m)   Wt 78.9 kg   SpO2 99%   BMI 24.26 kg/m  Constitutional: He appearswell-developedand well-nourished.No distress.  HENT: Mild left posterior head edema Eyes:EOMI.  No discharge.  Cardiovascular:RRR.  No JVD. Respiratory:Effort normal.  Clear. UJ:WJXBJ sounds normal.  He exhibitsno distension.  Neurological: Alert and oriented x2 with choices.  Expressive aphasia Motor: LUE/LLE: 5/5 proximal distal RUE: 4+/5 proximal more distal RLE: 4+/5 proximal to distal  Psychiatric: He has anormal mood and affect. Hisbehavior is normal.  Assessment/Plan: 1. 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  Physiatrist is providing close team supervision and 24 hour management of active medical problems listed below.  Physiatrist and rehab team continue to assess barriers to discharge/monitor patient progress toward functional and medical goals  Care Tool:  Bathing    Body parts bathed by patient: Right arm, Left arm, Chest,  Abdomen, Front perineal area, Buttocks, Right upper leg, Left upper leg, Right lower leg, Left lower leg, Face         Bathing assist Assist Level: Supervision/Verbal cueing     Upper Body Dressing/Undressing Upper body dressing   What is the patient wearing?: Pull over shirt    Upper body assist Assist Level: Set up assist    Lower Body Dressing/Undressing Lower body dressing      What is the patient wearing?: Pants     Lower body assist Assist for lower body dressing: Contact Guard/Touching assist     Toileting Toileting    Toileting assist Assist for toileting: Contact Guard/Touching assist     Transfers Chair/bed transfer  Transfers assist     Chair/bed transfer assist level: Contact Guard/Touching assist     Locomotion Ambulation   Ambulation assist      Assist level: Contact Guard/Touching assist Assistive device: Other (comment)(none ) Max distance: 200   Walk 10 feet activity   Assist     Assist level: Contact Guard/Touching assist Assistive device: Other (comment)(none )   Walk 50 feet activity   Assist    Assist level: Contact Guard/Touching assist Assistive device: Other (comment)(none )    Walk 150 feet activity   Assist    Assist level: Contact Guard/Touching assist Assistive device: Other (comment)(none )    Walk 10 feet on uneven surface  activity   Assist     Assist level: Minimal Assistance - Patient > 75% Assistive device: Other (comment)(none )  Wheelchair     Assist Will patient use wheelchair at discharge?: No   Wheelchair activity did not occur: N/A         Wheelchair 50 feet with 2 turns activity    Assist    Wheelchair 50 feet with 2 turns activity did not occur: N/A       Wheelchair 150 feet activity     Assist Wheelchair 150 feet activity did not occur: N/A          Medical Problem List and Plan: 1.Unsteady gait with aphasiasecondary to ventriculoperitoneal shunt  malfunction/infection status post multiple revision 09/02/2018  Continue CIR  Appreciate neurosurgery following 2. DVT Prophylaxis/Anticoagulation: SCDs. Monitor for any signs of DVT 3. Pain Management:Ultram as needed 4. Mood:Provide emotional support 5. Neuropsych: This patientisnot capable of making decisions on hisown behalf. 6. Skin/Wound Care:Routine skin checks 7. Fluids/Electrolytes/Nutrition:Routine in and outs   BMP within acceptable range on 10/30 8.Constipation. Laxative assistance  Overall stable 9.Hyperlipidemia. Lipitor 10.  Acute blood loss anemia  Hemoglobin 11.6 on 10/30  Labs ordered for tomorrow  Continue to monitor 11.  Hypoalbuminemia  Supplement initiated on 10/30   LOS: 2 days A FACE TO FACE EVALUATION WAS PERFORMED  Eathan Groman Karis Juba 09/12/2018, 8:58 AM

## 2018-09-13 ENCOUNTER — Inpatient Hospital Stay (HOSPITAL_COMMUNITY): Payer: BLUE CROSS/BLUE SHIELD | Admitting: Occupational Therapy

## 2018-09-13 ENCOUNTER — Inpatient Hospital Stay (HOSPITAL_COMMUNITY): Payer: BLUE CROSS/BLUE SHIELD | Admitting: Physical Therapy

## 2018-09-13 ENCOUNTER — Inpatient Hospital Stay (HOSPITAL_COMMUNITY): Payer: BLUE CROSS/BLUE SHIELD | Admitting: Speech Pathology

## 2018-09-13 LAB — CBC WITH DIFFERENTIAL/PLATELET
Abs Immature Granulocytes: 0.04 10*3/uL (ref 0.00–0.07)
BASOS PCT: 0 %
Basophils Absolute: 0 10*3/uL (ref 0.0–0.1)
Eosinophils Absolute: 0.2 10*3/uL (ref 0.0–0.5)
Eosinophils Relative: 2 %
HCT: 37.3 % — ABNORMAL LOW (ref 39.0–52.0)
Hemoglobin: 11.6 g/dL — ABNORMAL LOW (ref 13.0–17.0)
Immature Granulocytes: 1 %
LYMPHS ABS: 1.3 10*3/uL (ref 0.7–4.0)
Lymphocytes Relative: 18 %
MCH: 29.9 pg (ref 26.0–34.0)
MCHC: 31.1 g/dL (ref 30.0–36.0)
MCV: 96.1 fL (ref 80.0–100.0)
MONO ABS: 1.1 10*3/uL — AB (ref 0.1–1.0)
MONOS PCT: 15 %
Neutro Abs: 4.4 10*3/uL (ref 1.7–7.7)
Neutrophils Relative %: 64 %
PLATELETS: 208 10*3/uL (ref 150–400)
RBC: 3.88 MIL/uL — ABNORMAL LOW (ref 4.22–5.81)
RDW: 13.2 % (ref 11.5–15.5)
WBC: 7 10*3/uL (ref 4.0–10.5)
nRBC: 0 % (ref 0.0–0.2)

## 2018-09-13 NOTE — Progress Notes (Addendum)
Physical Therapy Session Note  Patient Details  Name: Charles Marquez MRN: [id number] Date of Birth: 04-17-1956  Today's Date: 09/13/2018 PT Individual Time: 4098-1191 PT Individual Time Calculation (min): 25 min   Short Term Goals: Week 1:  PT Short Term Goal 1 (Week 1): STG = LTG due to ELOS   Skilled Therapeutic Interventions/Progress Updates:  Pt received in handoff from PT. [name] daughter present & assisting throughout session & pt agreeable to tx. No c/o pain reported. Pt ambulates unit<>gift shop with max cuing to recall destination and total assist for pathfinding. Provided pt with list of 3 items & daughter wrote them down in phone for visual feedback, so pt could recall & locate items in gift shop. Pt unable to recall items even from choice of two, and requires extra time and cuing to verbalize correct word as pt frequently stating "card-lace" combining words "card" and "necklace". Pt located 3/3 items with max assist and demonstrates decreased attention to R environment, especially in gift shop. Back on unit pt able to recall that he went to gift shop when given choice of 2. Pt left sitting in recliner with family present to supervise.    Pt ambulates without AD & CGA, with increased gait speed.  Therapy Documentation Precautions:  Precautions Precautions: Fall Precaution Comments: short memory, poor safety awareness and recall/carryover  Restrictions Weight Bearing Restrictions: No    Therapy/Group: Individual Therapy  Sandi Mariscal 09/13/2018, 12:03 PM

## 2018-09-13 NOTE — Progress Notes (Signed)
Speech Language Pathology Daily Session Note  Patient Details  Name: Charles Marquez MRN: [id number] Date of Birth: 11/30/1955  Today's Date: 09/13/2018 SLP Individual Time: 1330-1430 SLP Individual Time Calculation (min): 60 min  Short Term Goals: Week 1: SLP Short Term Goal 1 (Week 1): STG=LTG due to ELOS   Skilled Therapeutic Interventions:  Skilled treatment session focused on communication goals and education with pt's wife and daughter. [name] family has started using a notebook to aid in pt's recall of events activities. Despite having information written down, pt is not able to read information accurately or use the notebook to recall information. When his wife asks him questions about information in the book, pt's responses are not accurate. After many attempts, pt demonstrates variability with reading information, repeating information, comparing information with his response and no ability to repeat information after 1 minute delay. Education provided to pt's wife and daughter to be the "information providers" to decrease the inaccuracy and incorrect practice with recall and language. Pt returned to room, left in care of his family.   Pain Pain Assessment Pain Scale: 0-10 Pain Score: 0-No pain  Therapy/Group: Individual Therapy  Sutton Hirsch 09/13/2018, 3:52 PM

## 2018-09-13 NOTE — Progress Notes (Signed)
Overall looks good.  Participating in therapies.  Gait and strength much better.  Patient appreciates some ongoing visual issues.  Difficult to say whether this is diplopia or some visual acuity issues.  Patient having a hard time elaborating.  Otherwise progressing well.  Still having some neurocognitive issues.  He is afebrile.  His neurologic exam is stable.  His shunt pumps and refills well.  Status post VP shunt.  Patient progressing slowly and steadily.  Continue current management.

## 2018-09-13 NOTE — Progress Notes (Addendum)
Occupational Therapy Session Note  Patient Details  Name: Charles Marquez MRN: [id number] Date of Birth: June 04, 1956  Today's Date: 09/13/2018 OT Individual Time: 1300-1330 OT Individual Time Calculation (min): 30 min    Short Term Goals: Week 1:  OT Short Term Goal 1 (Week 1): STGs = LTGs  Skilled Therapeutic Interventions/Progress Updates:    1:1 Focus on working memory and ways (with family) to compensate for decr in working memory. Discussed stragies for generating short lists to check off, numbers on items to help sequence and follow through. Pt performed a 4 stop obstacle course with list to perform to also address cardiopulmonary endurance. Pt did required max external cues to navigate through tasks.    Therapy Documentation Precautions:  Precautions Precautions: Fall Precaution Comments: short memory, poor safety awareness and recall/carryover  Restrictions Weight Bearing Restrictions: No Pain:  no c/o pain    Therapy/Group: Individual Therapy  Roney Mans [facility] 09/13/2018, 1:44 PM

## 2018-09-13 NOTE — Progress Notes (Signed)
[place] PHYSICAL MEDICINE & REHABILITATION PROGRESS NOTE  Subjective/Complaints: Patient seen sitting up in his chair this morning.  Wife at bedside.  She states patient slept well overnight.  Visual symptoms appear to have improved from yesterday.  ROS: Appears to deny CP, SOB, nausea, vomiting, diarrhea, however?  Reliability.  Objective: Vital Signs: Blood pressure 118/86, pulse 88, temperature 98.1 F (36.7 C), temperature source Oral, resp. rate 17, height 5\' 11"  (1.803 m), weight 78.9 kg, SpO2 95 %. No results found. Recent Labs    09/11/18 0543 09/13/18 0527  WBC 6.1 7.0  HGB 11.6* 11.6*  HCT 37.2* 37.3*  PLT 189 208   Recent Labs    09/11/18 0543  NA 139  K 4.7  CL 104  CO2 28  GLUCOSE 100*  BUN 21*  CREATININE 0.86  CALCIUM 8.8*    Physical Exam: BP 118/86 (BP Location: Right Arm)   Pulse 88   Temp 98.1 F (36.7 C) (Oral)   Resp 17   Ht 5\' 11"  (1.803 m)   Wt 78.9 kg   SpO2 95%   BMI 24.26 kg/m  Constitutional: He appearswell-developedand well-nourished.No distress.  HENT: Mild left posterior head edema Eyes:EOMI.  No discharge.  Cardiovascular:RRR. No JVD. Respiratory:Effort normal.  Clear. ZO:XWRUE sounds normal.  He exhibitsno distension.  Neurological: Alert and oriented x1  Expressive aphasia with apraxia Motor: LUE/LLE: 5/5 proximal distal RUE: 4+-5/5 proximal more distal RLE: 4+-5/5 proximal to distal  Psychiatric: He has anormal mood and affect. Hisbehavior is normal.  Assessment/Plan: 1. 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  Physiatrist is providing close team supervision and 24 hour management of active medical problems listed below.  Physiatrist and rehab team continue to assess barriers to discharge/monitor patient progress toward functional and medical goals  Care  Tool:  Bathing    Body parts bathed by patient: Right arm, Left arm, Chest, Abdomen, Front perineal area, Buttocks, Right upper leg, Left upper leg, Right lower leg, Left lower leg, Face         Bathing assist Assist Level: Contact Guard/Touching assist     Upper Body Dressing/Undressing Upper body dressing   What is the patient wearing?: Pull over shirt    Upper body assist Assist Level: Supervision/Verbal cueing    Lower Body Dressing/Undressing Lower body dressing      What is the patient wearing?: Pants, Underwear/pull up     Lower body assist Assist for lower body dressing: Contact Guard/Touching assist(From standing position)     Toileting Toileting    Toileting assist Assist for toileting: Dependent - Patient 0% Assistive Device Comment: (condom cath)   Transfers Chair/bed transfer  Transfers assist     Chair/bed transfer assist level: Supervision/Verbal cueing     Locomotion Ambulation   Ambulation assist      Assist level: Contact Guard/Touching assist Assistive device: (none) Max distance: >250 ft    Walk 10 feet activity   Assist     Assist level: Contact Guard/Touching assist Assistive device: Other (comment)(none )   Walk 50 feet activity   Assist    Assist level: Contact Guard/Touching assist Assistive device: Other (comment)(none )    Walk 150 feet activity   Assist    Assist level: Contact Guard/Touching assist Assistive device: Other (comment)(none )    Walk 10 feet on uneven surface  activity   Assist  Assist level: Minimal Assistance - Patient > 75% Assistive device: Other (comment)(none )   Wheelchair     Assist Will patient use wheelchair at discharge?: No   Wheelchair activity did not occur: N/A         Wheelchair 50 feet with 2 turns activity    Assist    Wheelchair 50 feet with 2 turns activity did not occur: N/A       Wheelchair 150 feet activity     Assist Wheelchair  150 feet activity did not occur: N/A          Medical Problem List and Plan: 1.Unsteady gait with aphasiasecondary to 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 DVT Prophylaxis/Anticoagulation: SCDs. Monitor for any signs of DVT 3. Pain Management:Ultram as needed 4. Mood:Provide emotional support 5. Neuropsych: This patientisnot capable of making decisions on hisown behalf. 6. Skin/Wound Care:Routine skin checks 7. Fluids/Electrolytes/Nutrition:Routine in and outs   BMP within acceptable range on 10/30  Labs ordered for Monday 8.Constipation. Laxative assistance  Overall stable 9.Hyperlipidemia. Lipitor 10.  Acute blood loss anemia  Hemoglobin 11.6 on 11/1  Continue to monitor 11.  Hypoalbuminemia  Supplement initiated on 10/30   LOS: 3 days A FACE TO FACE EVALUATION WAS PERFORMED   Karis Juba 09/13/2018, 12:08 PM

## 2018-09-13 NOTE — Progress Notes (Signed)
Physical Therapy Session Note  Patient Details  Name: Charles Marquez MRN: [id number] Date of Birth: September 13, 1956  Today's Date: 09/13/2018 PT Individual Time:1105-1135  PT Individual Time Calculation (min): 30 min   Short Term Goals: Week 1:  PT Short Term Goal 1 (Week 1): STG = LTG due to ELOS   Skilled Therapeutic Interventions/Progress Updates:    Pt received seated in recliner with family members present; daughter [name]) attended session. Denies pain and agreeable to treatment. S ambulation to/from rehab gym x 250'. S sit>stand and stand pivot transfers throughout session. CGA throwing/catching baseball in static stance, walking forwards, and walking backwards for balance, hand/eye coordination, and LE coordination; increased "sway" with backwards walking and when catching outside BOS. Higher functioning balance on bosu: 1) static standing, 2) squats, and 3) perturbations; tends to lose balance posteriorly. 2x15 sit>stands on airex from low surface for LE strengthening and balance; able to self correct 1-2 posterior LOBs. Pt demonstrated increased ability to follow simple commands during session. Pt remained seated on mat table at the end of the session with daughter present. Handoff to next PT.  Therapy Documentation Precautions:  Precautions Precautions: Fall Precaution Comments: short memory, poor safety awareness and recall/carryover  Restrictions Weight Bearing Restrictions: No Pain: Pain Assessment Pain Scale: 0-10 Pain Score: 0-No pain   Therapy/Group: Individual Therapy  [place] Kaleia Longhi 09/13/2018, 12:29 PM

## 2018-09-13 NOTE — Progress Notes (Signed)
Occupational Therapy Session Note  Patient Details  Name: Charles Marquez MRN: [id number] Date of Birth: 01/22/1956  Today's Date: 09/13/2018 OT Individual Time: 0850-1000 OT Individual Time Calculation (min): 70 min    Short Term Goals: Week 1:  OT Short Term Goal 1 (Week 1): STGs = LTGs  Skilled Therapeutic Interventions/Progress Updates:    Pt seen for OT ADL session focusing on ADL re-training, routine structure, and problem solving.  Pt sitting up in chair upon arrival, wife present and pt agreeable to tx session and denying pain. Pt oriented to place only today, required max-total A for use of memory book throughout session to immediately recall events within session. He completed bathing/dressing routine with CGA throughout with mod cuing for sequencing of ADL routines. Grooming tasks completed standing at sink. Pt inappropriately using self-care items, using toothbrush to shave face and requiring demonstrational cuing for appropriate use. Following orientation of items, pt able to use correctly with supervision and VCs for safety/ to slow down.  He ambulated throughout unit with CGA- close supervision, total A for path finding despite verbal and written directions.  In highly stimulating environment, reviewed pt's morning workday routine. Required max cuing for recalling basic morning routine, able to correctly choose option when given 2 options.  Pt returned to room at end of session, left seated in recliner with wife present.    Therapy Documentation Precautions:  Precautions Precautions: Fall Precaution Comments: short memory, poor safety awareness and recall/carryover  Restrictions Weight Bearing Restrictions: No Pain:   No/denies pain ADL: ADL Grooming: Supervision/safety Where Assessed-Grooming: Standing at sink Upper Body Bathing: Minimal cueing Where Assessed-Upper Body Bathing: Shower Lower Body Bathing: Minimal cueing, Contact guard Where Assessed-Lower Body  Bathing: Shower Upper Body Dressing: Setup Where Assessed-Upper Body Dressing: Standing at sink Lower Body Dressing: Contact guard Where Assessed-Lower Body Dressing: Standing at sink Toileting: Supervision/safety Where Assessed-Toileting: Teacher, adult education: Furniture conservator/restorer Method: Event organiser: Close supervision Film/video editor Method: Designer, industrial/product: Shower seat with back   Therapy/Group: Individual Therapy  Aydrian Halpin L 09/13/2018, 7:50 AM

## 2018-09-14 ENCOUNTER — Inpatient Hospital Stay (HOSPITAL_COMMUNITY): Payer: BLUE CROSS/BLUE SHIELD

## 2018-09-14 ENCOUNTER — Inpatient Hospital Stay (HOSPITAL_COMMUNITY): Payer: BLUE CROSS/BLUE SHIELD | Admitting: Physical Therapy

## 2018-09-14 NOTE — Progress Notes (Signed)
Physical Therapy Session Note  Patient Details  Name: Charles Marquez MRN: [id number] Date of Birth: 1956/06/17  Today's Date: 09/14/2018 PT Individual Time: 0761-5183 PT Individual Time Calculation (min): 73 min   Short Term Goals: Week 1:  PT Short Term Goal 1 (Week 1): STG = LTG due to ELOS   Skilled Therapeutic Interventions/Progress Updates:    Patient received up in chair, pleasant and willing to work with therapy; wife present and participated in session today as well. Continued working on cognitive training in community environments in hospital including busy hallway and gift shop with lists of items for patient to find, max VC required for use of list and scanning of environment to find all items however and limited by short term memory impairments. Able to descend 4 full flights of stairs with U railing and S and good control, also worked on [employer] level 6 for 10 minutes with LEs only at 70SPM as well as high intensity gait intervals and sidestepping/backwards walking in hallway for hip musculature activation. Continued balance training on mulch with Mod cues for correct performance of balance based activities. Also practiced balance and cognitive strategies on dynavision while standing on foam pad and pressing only green lights, required Mod cues for complete scanning of entire surface of board. He does continue to perseverate somewhat on objects in environment, requires Max cues of signs in environment for navigation. He was left up in his chair with wife present, all other needs met this morning.   Therapy Documentation Precautions:  Precautions Precautions: Fall Precaution Comments: short memory, poor safety awareness and recall/carryover  Restrictions Weight Bearing Restrictions: No General:   Vital Signs:   Pain: Pain Assessment Pain Scale: 0-10 Pain Score: 0-No pain Faces Pain Scale: No hurt    Therapy/Group: Individual Therapy  Deniece Ree PT, DPT,  CBIS  Supplemental Physical Therapist [facility]    Pager [phone] Acute Rehab Office [phone]   09/14/2018, 12:08 PM

## 2018-09-14 NOTE — Progress Notes (Signed)
Occupational Therapy Session Note  Patient Details  Name: Charles Marquez MRN: [id number] Date of Birth: 1956/02/21  Today's Date: 09/14/2018 OT Individual Time: 0900-1015 OT Individual Time Calculation (min): 75 min    Short Term Goals: Week 1:  OT Short Term Goal 1 (Week 1): STGs = LTGs  Skilled Therapeutic Interventions/Progress Updates:    Session focused on b/d tasks at shower level and dynamic balance training to reduce fall risk. Pt completed functional mobility around room and into walk in shower with (S). Moderate cueing provided for orientation, redirection to task, and attention. Pt completed all bathing at (S) level. Moderate cueing required for safety awareness during LB dressing, with pt attempting to complete in standing and experiencing 2 LOB. Pt was brought down to therapy gym where he completed functional forward and overhead reaching with 2lb ball while standing on foam block. CGA provided throughout. Pt used floor ladder to practice functional stepping, with several minor LOB, corrected with min A. Pt required frequent redirection to task d/t confusion. Pt ended session with Dynavision activity, with minor R inattention observed, with that quadrants reaction time 2 sec longer than others. Pt returned to room and was left sitting up with all needs met and wife present.   Therapy Documentation Precautions:  Precautions Precautions: Fall Precaution Comments: short memory, poor safety awareness and recall/carryover  Restrictions Weight Bearing Restrictions: No   Pain: Pain Assessment Pain Scale: 0-10 Pain Score: 0-No pain  Therapy/Group: Individual Therapy  Curtis Sites 09/14/2018, 11:19 AM

## 2018-09-14 NOTE — Progress Notes (Signed)
  NEUROSURGERY PROGRESS NOTE   No issues overnight.  Worked with therapy this morning continued issues with short term memory and putting independent thoughts into words  EXAM:  BP 120/89 (BP Location: Right Arm)   Pulse 87   Temp 98.1 F (36.7 C) (Oral)   Resp 20   Ht 5\' 11"  (1.803 m)   Wt 78.9 kg   SpO2 96%   BMI 24.26 kg/m   Awake, alert, oriented  Speech fluent, appropriate  MAEW Shunt pumps and refills  PLAN Stable Continue current care

## 2018-09-14 NOTE — Progress Notes (Signed)
Patient's wife refused the use of SCD as DVT prophylaxis. Wife stated he has been walking and SCD irritates him and disturb his sleep.RN explained the rationale for use of SCD and wife stated she understood  But insisted not to use it.

## 2018-09-14 NOTE — Progress Notes (Signed)
Speech Language Pathology Daily Session Note  Patient Details  Name: Charles Marquez MRN: [id number] Date of Birth: 11/25/1955  Today's Date: 09/14/2018 SLP Individual Time: 1420-1500 SLP Individual Time Calculation (min): 40 min  Short Term Goals: Week 1: SLP Short Term Goal 1 (Week 1): STG=LTG due to ELOS   Skilled Therapeutic Interventions:Skilled ST services focused on speech skills. SLP facilitated recall of today's events utilizing memory notebook, pt required min A verbal cues for orientation, however max A verbal cues to recall events following initial information provided and given written aid. SLP facilitated verbal expression at phrase level, naming people, objects and actions, utilizing simple picture description task, pt required Mod A verbal cues to form cohesive thought. Pt required continued encouragement and was easily frustrated. Pt was left in room with call bell within reach and wife in room. SLP reccomends to continue skilled services.     Pain Pain Assessment Pain Scale: 0-10 Pain Score: 0-No pain Faces Pain Scale: No hurt  Therapy/Group: Individual Therapy  [name]  [facility] 09/14/2018, 3:57 PM

## 2018-09-14 NOTE — Progress Notes (Signed)
Charles Marquez is a 62 y.o. male 02/08/1956 [id number]  Subjective: No new complaints. No new problems. Slept well. Feeling OK.  Objective: Vital signs in last 24 hours: Temp:  [98.1 F (36.7 C)-98.6 F (37 C)] 98.5 F (36.9 C) (11/02 1331) Pulse Rate:  [81-111] 111 (11/02 1331) Resp:  [18-24] 19 (11/02 1331) BP: (98-120)/(85-93) 112/93 (11/02 1331) SpO2:  [96 %-100 %] 97 % (11/02 1331) Weight change:  Last BM Date: 09/12/18  Intake/Output from previous day: 11/01 0701 - 11/02 0700 In: 900 [P.O.:900] Out: 1300 [Urine:1300] Last cbgs: CBG (last 3)  No results for input(s): GLUCAP in the last 72 hours.   Physical Exam General: No apparent distress. Wife is in the room. Eating bkfst  HEENT: not dry Lungs: Normal effort. Lungs clear to auscultation, no crackles or wheezes. Cardiovascular: Regular rate and rhythm, no edema Abdomen: S/NT/ND; BS(+) Musculoskeletal:  unchanged Neurological: No new neurological deficits Wounds: N/A    Skin: clear  Aging changes Mental state: Alert, cooperative    Lab Results: BMET    Component Value Date/Time   NA 139 09/11/2018 0543   K 4.7 09/11/2018 0543   CL 104 09/11/2018 0543   CO2 28 09/11/2018 0543   GLUCOSE 100 (H) 09/11/2018 0543   BUN 21 (H) 09/11/2018 0543   CREATININE 0.86 09/11/2018 0543   CALCIUM 8.8 (L) 09/11/2018 0543   GFRNONAA >60 09/11/2018 0543   GFRAA >60 09/11/2018 0543   CBC    Component Value Date/Time   WBC 7.0 09/13/2018 0527   RBC 3.88 (L) 09/13/2018 0527   HGB 11.6 (L) 09/13/2018 0527   HCT 37.3 (L) 09/13/2018 0527   PLT 208 09/13/2018 0527   MCV 96.1 09/13/2018 0527   MCH 29.9 09/13/2018 0527   MCHC 31.1 09/13/2018 0527   RDW 13.2 09/13/2018 0527   LYMPHSABS 1.3 09/13/2018 0527   MONOABS 1.1 (H) 09/13/2018 0527   EOSABS 0.2 09/13/2018 0527   BASOSABS 0.0 09/13/2018 0527    Studies/Results: No results found.  Medications: I have reviewed the patient's current  medications.  Assessment/Plan:   1.  Ventricular peritoneal shunt malfunction/infection.  Status post multiple revisions.  Unsteady gait with aphasia.  Continue CIR 2.  DVT prophylaxis with SCDs 3.  Pain management with as needed Ultram 4.  Constipation.  Laxative assistance 5.  Hyperlipidemia.  On Lipitor 6.  Anemia.  Stable hemoglobin 7.  Hypoalbuminemia.  Supplemental protein     Length of stay, days: 4  Sonda Primes , MD 09/14/2018, 1:41 PM

## 2018-09-15 ENCOUNTER — Inpatient Hospital Stay (HOSPITAL_COMMUNITY): Payer: BLUE CROSS/BLUE SHIELD | Admitting: Occupational Therapy

## 2018-09-15 NOTE — Progress Notes (Signed)
Charles Marquez is a 62 y.o. male 1956-10-12 [id number]  Subjective: No new complaints. No new problems. Slept well. Feeling OK.  Objective: Vital signs in last 24 hours: Temp:  [97.9 F (36.6 C)-98.5 F (36.9 C)] 97.9 F (36.6 C) (11/03 0540) Pulse Rate:  [85-111] 89 (11/03 0540) Resp:  [16-19] 16 (11/03 0540) BP: (112-129)/(84-93) 129/84 (11/03 0540) SpO2:  [97 %-98 %] 97 % (11/03 0540) Weight change:  Last BM Date: 09/14/18  Intake/Output from previous day: 11/02 0701 - 11/03 0700 In: 840 [P.O.:840] Out: -  Last cbgs: CBG (last 3)  No results for input(s): GLUCAP in the last 72 hours.   Physical Exam General: No apparent distress.  Wife is at bedside HEENT: not dry Lungs: Normal effort. Lungs clear to auscultation, no crackles or wheezes. Cardiovascular: Regular rate and rhythm, no edema Abdomen: S/NT/ND; BS(+) Musculoskeletal:  unchanged Neurological: No new neurological deficits Wounds: N/A    Skin: clear  Aging changes Mental state: Alert, cooperative    Lab Results: BMET    Component Value Date/Time   NA 139 09/11/2018 0543   K 4.7 09/11/2018 0543   CL 104 09/11/2018 0543   CO2 28 09/11/2018 0543   GLUCOSE 100 (H) 09/11/2018 0543   BUN 21 (H) 09/11/2018 0543   CREATININE 0.86 09/11/2018 0543   CALCIUM 8.8 (L) 09/11/2018 0543   GFRNONAA >60 09/11/2018 0543   GFRAA >60 09/11/2018 0543   CBC    Component Value Date/Time   WBC 7.0 09/13/2018 0527   RBC 3.88 (L) 09/13/2018 0527   HGB 11.6 (L) 09/13/2018 0527   HCT 37.3 (L) 09/13/2018 0527   PLT 208 09/13/2018 0527   MCV 96.1 09/13/2018 0527   MCH 29.9 09/13/2018 0527   MCHC 31.1 09/13/2018 0527   RDW 13.2 09/13/2018 0527   LYMPHSABS 1.3 09/13/2018 0527   MONOABS 1.1 (H) 09/13/2018 0527   EOSABS 0.2 09/13/2018 0527   BASOSABS 0.0 09/13/2018 0527    Studies/Results: No results found.  Medications: I have reviewed the patient's current medications.  Assessment/Plan:  1.  Endotracheal  peritoneal shunt malfunction/infection.  Status post multiple revisions.  Unsteady gait and aphasia.  Continue condition CIR 2.  DVT prophylaxis with SCDs 3.  Pain management with Ultram as needed 4.  Constipation.  Laxative assistance 5.  Hyperlipidemia.  Continue Lipitor 6.  Anemia.  Stable hemoglobin 7.  Hypoalbuminemia.  Supplemental protein       Length of stay, days: 5  Sonda Primes , MD 09/15/2018, 11:53 AM

## 2018-09-15 NOTE — Progress Notes (Signed)
Occupational Therapy Session Note  Patient Details  Name: Charles Marquez MRN: [id number] Date of Birth: 1956-02-28  Today's Date: 09/15/2018 OT Individual Time: 4010-2725 OT Individual Time Calculation (min): 60 min    Short Term Goals: Week 1:  OT Short Term Goal 1 (Week 1): STGs = LTGs  Skilled Therapeutic Interventions/Progress Updates:    Pt seen for OT session focusing on cognitive remediation and sequencing during cooking task, pt's wife present and participating  . Pt sitting up in recliner pon arrival with wife present, pt denying pain. [name] reporting she already assisted with bathing/dressing routine in order to assist pt with orientation and tasks of the day.  Pt required max multi-modal cuing for orientation of time, he was orientated to "hospital", however, did not know why he was here. Re-oriented at beginning of session and pt oriented to day of the week when re-assessed at end of session.  Completed abstract thinking task, writing steps for basic cooking task while seated in bedroom, able to follow through with max cuing once in ADL apartment. He completed functional ambulation throughout unit and ADL apartment with supervision, min-mod cuing for awareness to environmental obstacles on R. Completed simple meal prep activity utilizing stove, requiring max cuing throughout for safety awareness, and sequencing of task. Throughout session, demonstrated difficulty with word finding and word creation, demonstrating perseverative tendencies in attempts for word finding.  Following seated rest break, completed simulated tub/shower transfer, stepping over tub wall with close supervision and transitioning to shower chair. Education provided to pt's wife regarding pt's CLOF with ADL tasks, need for cuing, safety, sequencing, and AE for increased safety.  Pt returned to room at end of session, max cuing to recall room number despite orientation.  Pt left seated in recliner at end of  session. Unable to recall any part of session in order to put in memory notebook.  Therapy Documentation Precautions:  Precautions Precautions: Fall Precaution Comments: short memory, poor safety awareness and recall/carryover  Restrictions Weight Bearing Restrictions: No Pain: Pain Assessment Pain Score: No/denies pain   Therapy/Group: Individual Therapy  Charles Marquez 09/15/2018, 6:34 AM

## 2018-09-16 ENCOUNTER — Inpatient Hospital Stay (HOSPITAL_COMMUNITY): Payer: BLUE CROSS/BLUE SHIELD | Admitting: Occupational Therapy

## 2018-09-16 ENCOUNTER — Ambulatory Visit (HOSPITAL_COMMUNITY): Payer: BLUE CROSS/BLUE SHIELD | Admitting: Physical Therapy

## 2018-09-16 ENCOUNTER — Inpatient Hospital Stay (HOSPITAL_COMMUNITY): Payer: BLUE CROSS/BLUE SHIELD | Admitting: Speech Pathology

## 2018-09-16 ENCOUNTER — Inpatient Hospital Stay (HOSPITAL_COMMUNITY): Payer: BLUE CROSS/BLUE SHIELD

## 2018-09-16 DIAGNOSIS — H539 Unspecified visual disturbance: Secondary | ICD-10-CM

## 2018-09-16 LAB — BASIC METABOLIC PANEL
Anion gap: 6 (ref 5–15)
BUN: 23 mg/dL — AB (ref 6–20)
CO2: 30 mmol/L (ref 22–32)
CREATININE: 1 mg/dL (ref 0.61–1.24)
Calcium: 9.1 mg/dL (ref 8.9–10.3)
Chloride: 102 mmol/L (ref 98–111)
GFR calc Af Amer: >60 mL/min (ref >60)
GFR calc non Af Amer: >60 mL/min (ref >60)
GLUCOSE: 139 mg/dL — AB (ref 70–99)
POTASSIUM: 4.4 mmol/L (ref 3.5–5.1)
Sodium: 138 mmol/L (ref 135–145)

## 2018-09-16 NOTE — Progress Notes (Signed)
Physical Therapy Session Note  Patient Details  Name: Charles Marquez MRN: [id number] Date of Birth: 15-Feb-1956  Today's Date: 09/16/2018 PT Individual Time: 1130-1200  Total Time: 30 mins  Short Term Goals: Week 1:  PT Short Term Goal 1 (Week 1): STG = LTG due to ELOS   Skilled Therapeutic Interventions/Progress Updates:    Therapeutic co-treatment with SLP for first 30 mins of session for focus on cognitive impairements.   Pt received seated in w/c with wife present (attended session). Denies pain and agreeable to treatment. Pt reports blurry/double vision. Cognitive tasks included problem solving to follow simple written directions to get to the kitchen (1. Turn right out of room, 2. Turn right at nurse's station, 3. Turn left at first door) and problem solving to find and attain 3 items (cup, spoon, and plate). Pt verbalized the need and desire to understand why he was performing the task, but demonstrated difficulty comprehending. He reported getting "confused" and "doesn't understand why" despite being educated multiple times. Required max cueing for task completion.   Pt ambulated to/from room with supervision x 250'. S sit>stand and stand pivot transfers throughout session. CGA throwing/catching baseball standing on airex with 1) narrow BOS, 2) R semi tandem, 3) L semi tandem for balance and hand/eye coordination; reports increased difficulty with semi tandem stances but able to self-correct minor LOBs via ankle/stepping strategies. Higher functioning balance on bosu: 1) static standing, 2) squats, 3) perturbations, 4) catching/throwing ball; tends to lose balance posteriorly>anteriorly>laterally; required minA to correct posterior and anterior LOBs. CGA stair navigation x 4 flights with 1 handrail for endurance, foot clearance, and LE coordination; 1 incidence of toe getting caught on step but pt able to correct self. Pt remained seated in recliner with wife present at the end of the  session.  Therapy Documentation Precautions:  Precautions Precautions: Fall Precaution Comments: short memory, poor safety awareness and recall/carryover  Restrictions Weight Bearing Restrictions: No General:   Vital Signs: Therapy Vitals Temp: 97.8 F (36.6 C) Temp Source: Oral Pulse Rate: 88 Resp: 18 BP: 110/87 Patient Position (if appropriate): Sitting Oxygen Therapy SpO2: 97 % O2 Device: Room Air Pain: Pain Assessment Pain Scale: 0-10 Pain Score: 0-No pain   Therapy/Group: Individual Therapy and Co-Treatment  [place] Adamariz Gillott 09/16/2018, 3:13 PM

## 2018-09-16 NOTE — Plan of Care (Signed)
Goals downgraded to supervision overall due to severe cognitive impairments. See POC for goal details. Danisha Brassfield, OTR/L

## 2018-09-16 NOTE — Progress Notes (Signed)
Speech Language Pathology Daily Session Note  Patient Details  Name: Charles Marquez MRN: [id number] Date of Birth: Jun 26, 1956  Today's Date: 09/16/2018   Skilled treatment #1 SLP Individual Time: 1100-1130 SLP Individual Time Calculation (min): 30 min   Skilled treatment #2 SLP Individual Time: 1345-1455 SLP Individual Time Calculation (min): 70 min  Short Term Goals: Week 1: SLP Short Term Goal 1 (Week 1): STG=LTG due to ELOS   Skilled Therapeutic Interventions:  Skilled treatment session focused on cognition. SLP treated with PT student of further cognitive function during physical activity. Of note, most remarkable was pt's difficulty with vision. Pt with blurred vision, double vision and intermittent not seeing printed words. On previous session (11/1) pt able to "see separate" words and able to point to each word. Even though pt is aphasic pt was able to point to words individually and attempt them. Consulted with MD who will order CT scan. Pt required Total A to  Max A cues to problem solve simple written navigation task to kitchen and to problem solve locating 3 items (that were written down for pt). Pt handed off to PT.   Skilled treatment session #2 focused on cognition goals and education with wife on downgrading LTGs to reflect Max A. Wife states that she will be home from work for the remainder of the week and next week. She didn't have any questions and voiced understanding of current abilities. Skilled treatment session also focused on cognition goals. Pt walked with SLP in dayroom. With initial Max A cues, pt able to deal playing cards into 2 piles and able to engage in War. Pt with mental fatigue after 1 round and then reporting feeling different - but no overt s/s of any cognitive decline. [name] blood pressure taken and was within normal range and he was able to ambulate back to room. Pt left upright in recliner and support given.      Pain Pain Assessment Pain Scale:  0-10 Pain Score: 0-No pain  Therapy/Group: Individual Therapy  Brookelle Pellicane 09/16/2018, 11:47 AM

## 2018-09-16 NOTE — Progress Notes (Signed)
Occupational Therapy Session Note  Patient Details  Name: Charles Marquez MRN: [id number] Date of Birth: [birth year]  Today's Date: 09/16/2018 OT Individual Time: 0845-1000 OT Individual Time Calculation (min): 75 min    Short Term Goals: Week 1:  OT Short Term Goal 1 (Week 1): STGs = LTGs  Skilled Therapeutic Interventions/Progress Updates:    Pt seen for OT session focusing on ADL and IADL re-training with emphasis on cognitive remediation, memory, and sequencing. Pt sitting up in recliner upon arrival with wife present. Pt denying pain and agreeable to tx session. He did have complaints of dizziness, orthostatic BP assessed, 130/88 in sitting, 113/91 in standing. Pt able to continue on with therapy with no further complaints. He ambulated throughout room and unit with close suerprvision, continues to demonstrates mild R inattention, running into environmental obstacles on R.  He gathered clothing items in prep for shower and bathed seated on shower chair with max cuing throughout for sequencing and attention to task for thoroughness. He dressed seated in recliner, willingly following directions for sitting to complete task to decrease risk of falls. Grooming tasks completed standing at sink with supervision. Pt unable to immediately recall ADL routine to put in memory journal following task, ultimately requiring max questioning cues.  He ambulated to ADL apartment, completed bed making activity, correctly sequencing task, however, demonstrating poor mental flexibility skills for task. and maintaining high level dynamic balance with supervision. Pt unable to immediately recall events of session taking place in ADL apartment immediately following task. Pt returned to room at end of session, left seated in recliner with all needs in reach.  Pt becoming slightly frustrated throughout session in regards to memory impairments. Cognitive rest breaks required throughout. [name] present and  participating throughout session. Provided education to her regarding continuum of care and d/c planning.   Therapy Documentation Precautions:  Precautions Precautions: Fall Precaution Comments: short memory, poor safety awareness and recall/carryover  Restrictions Weight Bearing Restrictions: No Pain:   No/denies pain ADL: ADL Grooming: Supervision/safety Where Assessed-Grooming: Standing at sink Upper Body Bathing: Minimal cueing Where Assessed-Upper Body Bathing: Shower Lower Body Bathing: Minimal cueing, Contact guard Where Assessed-Lower Body Bathing: Shower Upper Body Dressing: Setup Where Assessed-Upper Body Dressing: Standing at sink Lower Body Dressing: Contact guard Where Assessed-Lower Body Dressing: Standing at sink Toileting: Supervision/safety Where Assessed-Toileting: Teacher, adult education: Furniture conservator/restorer Method: Event organiser: Close supervision Film/video editor Method: Designer, industrial/product: Shower seat with back   Therapy/Group: Individual Therapy  Charles Marquez 09/16/2018, 7:11 AM

## 2018-09-16 NOTE — Progress Notes (Signed)
[place] PHYSICAL MEDICINE & REHABILITATION PROGRESS NOTE  Subjective/Complaints: Patient seen sitting up in his chair this morning.  Wife at bedside.  Patient states he slept well overnight, confirmed with wife.  Wife has concerns regarding visual deficits stating that she believes they are worse and asks for ophthalmology referral.  ROS: Appears to deny CP, SOB, nausea, vomiting, diarrhea, however?  Reliability.  Objective: Vital Signs: Blood pressure 114/83, pulse 90, temperature 98.2 F (36.8 C), temperature source Oral, resp. rate 15, height 5\' 11"  (1.803 m), weight 78.9 kg, SpO2 96 %. No results found. No results for input(s): WBC, HGB, HCT, PLT in the last 72 hours. No results for input(s): NA, K, CL, CO2, GLUCOSE, BUN, CREATININE, CALCIUM in the last 72 hours.  Physical Exam: BP 114/83 (BP Location: Right Arm)   Pulse 90   Temp 98.2 F (36.8 C) (Oral)   Resp 15   Ht 5\' 11"  (1.803 m)   Wt 78.9 kg   SpO2 96%   BMI 24.26 kg/m  Constitutional: He appearswell-developedand well-nourished.No distress.  HENT: Mild left posterior head edema Eyes:EOMI.  No discharge.  Cardiovascular: RRR.  No JVD. Respiratory:Effort normal.  Clear. ZO:XWRUE sounds normal.  He exhibitsno distension.  Neurological: Alert and oriented x2 Expressive aphasia with apraxia Motor: LUE/LLE: 5/5 proximal distal RUE: 4+-5/5 proximal more distal, stable RLE: 4+-5/5 proximal to distal, stable Psychiatric: He has anormal mood and affect. Hisbehavior is normal.  Assessment/Plan: 1. 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  Physiatrist is providing close team supervision and 24 hour management of active medical problems listed below.  Physiatrist and rehab team continue to assess barriers to discharge/monitor patient progress toward functional and medical  goals  Care Tool:  Bathing    Body parts bathed by patient: Right arm, Left arm, Chest, Abdomen, Front perineal area, Buttocks, Right upper leg, Left upper leg, Right lower leg, Left lower leg, Face         Bathing assist Assist Level: Supervision/Verbal cueing     Upper Body Dressing/Undressing Upper body dressing   What is the patient wearing?: Pull over shirt    Upper body assist Assist Level: Supervision/Verbal cueing    Lower Body Dressing/Undressing Lower body dressing      What is the patient wearing?: Pants     Lower body assist Assist for lower body dressing: Supervision/Verbal cueing     Toileting Toileting    Toileting assist Assist for toileting: Supervision/Verbal cueing Assistive Device Comment: (condom cath)   Transfers Chair/bed transfer  Transfers assist     Chair/bed transfer assist level: Supervision/Verbal cueing     Locomotion Ambulation   Ambulation assist      Assist level: Supervision/Verbal cueing Assistive device: Other (comment)(none ) Max distance: 200 ft   Walk 10 feet activity   Assist     Assist level: Contact Guard/Touching assist Assistive device: Other (comment)(none)   Walk 50 feet activity   Assist    Assist level: Contact Guard/Touching assist Assistive device: Other (comment)(none )    Walk 150 feet activity   Assist    Assist level: Contact Guard/Touching assist Assistive device: Other (comment)(none )    Walk 10 feet on uneven surface  activity   Assist     Assist level: Contact Guard/Touching assist Assistive device: Other (comment)(none )   Wheelchair     Assist Will patient use wheelchair at discharge?:  No   Wheelchair activity did not occur: N/A         Wheelchair 50 feet with 2 turns activity    Assist    Wheelchair 50 feet with 2 turns activity did not occur: N/A       Wheelchair 150 feet activity     Assist Wheelchair 150 feet activity did not  occur: N/A          Medical Problem List and Plan: 1.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 DVT Prophylaxis/Anticoagulation: SCDs. Monitor for any signs of DVT 3. Pain Management:Ultram as needed 4. Mood:Provide emotional support 5. Neuropsych: This patientisnot capable of making decisions on hisown behalf. 6. Skin/Wound Care:Routine skin checks 7. Fluids/Electrolytes/Nutrition:Routine in and outs   BMP within acceptable range on 10/30  Labs pending 8.Constipation. Laxative assistance  Overall stable 9.Hyperlipidemia. Lipitor 10.  Acute blood loss anemia  Hemoglobin 11.6 on 11/1  Continue to monitor 11.  Hypoalbuminemia  Supplement initiated on 10/30   LOS: 6 days A FACE TO FACE EVALUATION WAS PERFORMED  Perri Lamagna Karis Juba 09/16/2018, 9:36 AM

## 2018-09-17 ENCOUNTER — Inpatient Hospital Stay (HOSPITAL_COMMUNITY): Payer: BLUE CROSS/BLUE SHIELD | Admitting: Physical Therapy

## 2018-09-17 ENCOUNTER — Inpatient Hospital Stay (HOSPITAL_COMMUNITY): Payer: BLUE CROSS/BLUE SHIELD | Admitting: Speech Pathology

## 2018-09-17 ENCOUNTER — Inpatient Hospital Stay (HOSPITAL_COMMUNITY): Payer: BLUE CROSS/BLUE SHIELD | Admitting: Occupational Therapy

## 2018-09-17 NOTE — Discharge Instructions (Signed)
Inpatient Rehab Discharge Instructions  Kareen Hitsman [facility] Discharge date and time: No discharge date for patient encounter.   Activities/Precautions/ Functional Status: Activity: activity as tolerated Diet: regular diet Wound Care: keep wound clean and dry Functional status:  ___ No restrictions     ___ Walk up steps independently ___ 24/7 supervision/assistance   ___ Walk up steps with assistance ___ Intermittent supervision/assistance  ___ Bathe/dress independently ___ Walk with walker     _x__ Bathe/dress with assistance ___ Walk Independently    ___ Shower independently ___ Walk with assistance    ___ Shower with assistance ___ No alcohol     ___ Return to work/school ________  Special Instructions: No driving   COMMUNITY REFERRALS UPON DISCHARGE:    Outpatient: PT, OT, SP  Agency:[facility] Phone:[phone]   Date of Last Service:09/18/2018  Appointment Date/Time:NOVEMBER 7 1:00-3:30 PM ALL THREE THERAPIES  Medical Equipment/Items Ordered:TUB SEAT  Agency/Supplier:[facility]   [phone]    My questions have been answered and I understand these instructions. I will adhere to these goals and the provided educational materials after my discharge from the hospital.  Patient/Caregiver Signature _______________________________ Date __________  Clinician Signature _______________________________________ Date __________  Please bring this form and your medication list with you to all your follow-up doctor's appointments.

## 2018-09-17 NOTE — Progress Notes (Signed)
Occupational Therapy Session Note  Patient Details  Name: Charles Marquez MRN: [id number] Date of Birth: 1956-01-05  Today's Date: 09/17/2018 OT Individual Time: 0845-1000 OT Individual Time Calculation (min): 75 min    Short Term Goals: Week 1:  OT Short Term Goal 1 (Week 1): STGs = LTGs  Skilled Therapeutic Interventions/Progress Updates:    Pt seen for OT ADL bathing/dressing session and IADL re-training. Pt sitting up in recliner upon arrival, denying pain and agreeable to tx session. Pt with very levels of orientation throughout session, overall max questioning cues to correctly recall setting and date.  He ambulated throughout session at overall supervision level, no AD. He required intermittent CGA-min A for dynamic balance when doffing clothes from standing position as well as when completing IADL tasks requiring extensive dynamic balance.  He gathered clothing items and completed bathing/dressing routine, see assist levels below. Throughout tasks, pt required encouragement for independence and cues for sequencing. Pt receptive to education/cuing for seated ADL tasks in order to decrease risk of fall.  In ADL apartment, completed sweeping and vacuuming tasks. Educated pt and wife regarding importance and benefits of participation and independence with self-care and IADL tasks, however, with need for close supervision for safety and cuing when completing these tasks. Pt returned to room at end of session, max cuing to recall events to place in memory notebook. He was able to correctly identify 100% of family members. Pt left seated in recliner at end of session with wife present.   Therapy Documentation Precautions:  Precautions Precautions: Fall Precaution Comments: short memory, poor safety awareness and recall/carryover  Restrictions Weight Bearing Restrictions: No Pain:   No/denies pain ADL: ADL Grooming: Supervision/safety Where Assessed-Grooming: Standing at sink Upper  Body Bathing: Supervision/safety Where Assessed-Upper Body Bathing: Shower Lower Body Bathing: Supervision/safety Where Assessed-Lower Body Bathing: Shower Upper Body Dressing: Independent Where Assessed-Upper Body Dressing: Chair Lower Body Dressing: Supervision/safety Where Assessed-Lower Body Dressing: Chair Toileting: Supervision/safety Where Assessed-Toileting: Teacher, adult education: Close supervision Statistician Method: Event organiser: Close supervision Film/video editor Method: Designer, industrial/product: Shower seat with back   Therapy/Group: Individual Therapy  [name] L 09/17/2018, 7:10 AM

## 2018-09-17 NOTE — Progress Notes (Signed)
Social Work  Discharge Note  The overall goal for the admission was met for:   Discharge location: Yes-HOME WITH WIFE AND DAUGHTER TO ASSIST  Length of Stay: Yes-8 DAYS  Discharge activity level: Yes-SUPERVISION LEVEL  Home/community participation: Yes  Services provided included: MD, RD, PT, OT, SLP, RN, CM, Pharmacy and SW  Financial Services: Private Insurance: BCBS  Follow-up services arranged: Outpatient: [facility]-PT, OT,SP-11/7 1:00-3:00 PM and DME: [facility]-TUB SEAT  Comments (or additional information):WIFE WAS HERE DAILY AND PARTICIPATED IN THERAPIES WITH PT AND WAS VERY INVOLVED IN HIS CARE. AWARE OF HIS NEED FOR CLOSE 24 HR SUPERVISION AND CUEING.  Patient/Family verbalized understanding of follow-up arrangements: Yes  Individual responsible for coordination of the follow-up plan: KIM-WIFE  Confirmed correct DME delivered: ,  G 09/17/2018    ,  G 

## 2018-09-17 NOTE — Progress Notes (Signed)
Occupational Therapy Discharge Summary  Patient Details  Name: Charles Marquez MRN: [id number] Date of Birth: 1956/09/07  Patient has met 10 of 10 long term goals due to improved activity tolerance, improved balance, postural control, improved attention, improved awareness and improved coordination.  Patient to discharge at overall close supervision-occasional CGA due to severe cognitive impairments level.  Patient's care partner is independent to provide the necessary physical and cognitive assistance at discharge.  [name] has been present at all times throughout rehab admission and actively participating in therapy session assisting husband. She has been made aware of pt's need for strict 24 hour supervision due to pt's severe cognitive deficits. She demonstrates and voices willing and ableness to provide this level of assist at d/c. Pt cont to be most limited by cognitive deficits including severe short term memory deficits, poor functional problem solving, mental flexibility, and R inattention. Pt requiring overall max A for cognitive tasks  Recommendation:  Patient will benefit from ongoing skilled OT services in outpatient setting to continue to advance functional skills in the area of BADL, iADL and Reduce care partner burden.  Equipment: Shower chair  Reasons for discharge: treatment goals met and discharge from hospital  Patient/family agrees with progress made and goals achieved: Yes  OT Discharge Precautions/Restrictions  Precautions Precautions: Fall Precaution Comments: short memory, nd recall/carryover  Restrictions Weight Bearing Restrictions: No ADL ADL Grooming: Supervision/safety Where Assessed-Grooming: Standing at sink Upper Body Bathing: Supervision/safety Where Assessed-Upper Body Bathing: Shower Lower Body Bathing: Supervision/safety Where Assessed-Lower Body Bathing: Shower Upper Body Dressing: Independent Where Assessed-Upper Body Dressing:  Chair Lower Body Dressing: Supervision/safety Where Assessed-Lower Body Dressing: Chair Toileting: Supervision/safety Where Assessed-Toileting: Glass blower/designer: Close supervision Armed forces technical officer Method: Magazine features editor: Close supervision Social research officer, government Method: Heritage manager: Civil engineer, contracting with back Vision Baseline Vision/History: Wears glasses Wears Glasses: At all times Patient Visual Report: Eye fatigue/eye pain/headache;Blurring of vision(Difficult to assess 2/2 cognitive/ communication impairments, however, does report changes since admission, however, unable to elaborate) Vision Assessment?: Yes Eye Alignment: Within Functional Limits Ocular Range of Motion: Within Functional Limits Alignment/Gaze Preference: Within Defined Limits Tracking/Visual Pursuits: Decreased smoothness of horizontal tracking Saccades: Additional eye shifts occurred during testing Convergence: Impaired (comment) Perception  Perception: Impaired Inattention/Neglect: Does not attend to right visual field Comments: R inattention, however, improving from admission Praxis Praxis: Impaired Praxis Impairment Details: Perseveration;Motor planning;Ideomotor;Ideation Cognition Overall Cognitive Status: Impaired/Different from baseline Arousal/Alertness: Awake/alert Orientation Level: Oriented to person;Oriented to situation Attention: Sustained Sustained Attention: Impaired Sustained Attention Impairment: Functional basic;Verbal basic Memory: Impaired Memory Impairment: Decreased short term memory;Decreased recall of new information Decreased Short Term Memory: Verbal basic;Functional basic Awareness: Impaired Awareness Impairment: Emergent impairment Problem Solving: Impaired Problem Solving Impairment: Verbal basic;Functional basic Executive Function: (All impaired 2/2 lower level cognitive deficits) Safety/Judgment: Impaired Comments: decreased  safety awareness with impaired insights into deficits and functional implications Sensation Sensation Light Touch: Appears Intact Proprioception: Appears Intact Stereognosis: Appears Intact Coordination Gross Motor Movements are Fluid and Coordinated: No Fine Motor Movements are Fluid and Coordinated: No Finger Nose Finger Test: WFL; tremors noted, however, reports had PTA Motor  Motor Motor: Within Functional Limits;Motor apraxia Trunk/Postural Assessment  Cervical Assessment Cervical Assessment: Within Functional Limits Thoracic Assessment Thoracic Assessment: Within Functional Limits Lumbar Assessment Lumbar Assessment: Within Functional Limits Postural Control Postural Control: Deficits on evaluation(Delayed righting reactions)  Balance Balance Balance Assessed: Yes Static Sitting Balance Static Sitting - Balance Support: Feet supported;No upper extremity supported Static Sitting - Level  of Assistance: 7: Independent Dynamic Sitting Balance Dynamic Sitting - Balance Support: No upper extremity supported;Feet supported Dynamic Sitting - Level of Assistance: 7: Independent Sitting balance - Comments: Sitting to complete dressing tasks Static Standing Balance Static Standing - Level of Assistance: 5: Stand by assistance Dynamic Standing Balance Dynamic Standing - Balance Support: No upper extremity supported;During functional activity Dynamic Standing - Level of Assistance: 5: Stand by assistance Dynamic Standing - Comments: Standing to complete bathing/dressing routine Extremity/Trunk Assessment RUE Assessment RUE Assessment: Within Functional Limits LUE Assessment LUE Assessment: Within Functional Limits   Charles Marquez L 09/17/2018, 10:36 AM

## 2018-09-17 NOTE — Progress Notes (Signed)
Physical Therapy Discharge Summary  Patient Details  Name: Charles Marquez MRN: [id number] Date of Birth: 05/30/1956  Today's Date: 09/17/2018 PT Individual Time: 1100-1200 PT Individual Time Calculation (min): 60 min    Patient has met 10 of 12 long term goals due to improved activity tolerance, improved balance, improved postural control, increased strength and improved coordination.  Patient to discharge at an ambulatory level Supervision.   Patient's care partner is independent to provide the necessary cognitive assistance at discharge.  Reasons goals not met: Pt requires max assist for attention and awareness.  Recommendation:  Patient will benefit from ongoing skilled PT services in outpatient setting to continue to advance safe functional mobility, address ongoing impairments in awareness, attention, balance, coordination, and minimize fall risk.  Equipment: No equipment provided  Reasons for discharge: treatment goals met and discharge from hospital  Patient/family agrees with progress made and goals achieved: Yes  PT Discharge Precautions/Restrictions Precautions Precautions: Fall Precaution Comments: short memory, nd recall/carryover  Restrictions Weight Bearing Restrictions: No Pain Pain Assessment Pain Scale: 0-10 Pain Score: 0-No pain Vision/Perception  Vision - Assessment Eye Alignment: Within Functional Limits Ocular Range of Motion: Within Functional Limits Alignment/Gaze Preference: Within Defined Limits Tracking/Visual Pursuits: Decreased smoothness of horizontal tracking Saccades: Additional eye shifts occurred during testing Convergence: Impaired (comment) Diplopia Assessment: Other (comment);Present in near gaze(wife and patient report it comes and goes intermittently without solid pattern ) Perception Perception: Impaired Inattention/Neglect: Does not attend to right visual field Comments: R inattention, however improving from  admission Praxis Praxis: Impaired Praxis Impairment Details: Perseveration;Motor planning;Ideomotor;Ideation  Cognition Overall Cognitive Status: Impaired/Different from baseline Arousal/Alertness: Awake/alert Orientation Level: Oriented to person;Oriented to situation Attention: Sustained;Selective;Alternating;Divided Sustained Attention: Impaired Sustained Attention Impairment: Functional basic;Verbal basic;Verbal complex;Functional complex Selective Attention: Impaired Selective Attention Impairment: Functional basic;Verbal complex;Verbal basic;Functional complex Alternating Attention: Impaired Alternating Attention Impairment: Verbal basic;Functional basic;Verbal complex;Functional complex Divided Attention: Impaired Divided Attention Impairment: Verbal basic;Verbal complex;Functional basic;Functional complex Memory: Impaired Memory Impairment: Decreased short term memory;Decreased recall of new information Decreased Short Term Memory: Verbal basic;Functional basic Awareness: Impaired Awareness Impairment: Emergent impairment Problem Solving: Impaired Problem Solving Impairment: Verbal basic;Functional basic Executive Function: (All impaired 2/2 lower level cognitive deficits) Behaviors: Impulsive;Perseveration Safety/Judgment: Impaired Comments: decreased safety awareness with impaired insights into deficits and functional implications Sensation Sensation Light Touch: Appears Intact Proprioception: Appears Intact Stereognosis: Appears Intact Coordination Gross Motor Movements are Fluid and Coordinated: No Fine Motor Movements are Fluid and Coordinated: Not tested Coordination and Movement Description: difficulties with motor planning for motor coordination Finger Nose Finger Test: WFL; tremors noted, however, reports had PTA Motor  Motor Motor: Within Functional Limits;Motor apraxia Motor - Discharge Observations: LEs grossly 5/5; 5xSTS 11 seconds  Mobility Bed  Mobility Bed Mobility: Rolling Right;Rolling Left;Right Sidelying to Sit;Left Sidelying to Sit;Supine to Sit;Sitting - Scoot to [employer] of Bed Rolling Right: Supervision/verbal cueing Rolling Left: Supervision/Verbal cueing Right Sidelying to Sit: Supervision/Verbal cueing Left Sidelying to Sit: Supervision/Verbal cueing Supine to Sit: Supervision/Verbal cueing Sitting - Scoot to Edge of Bed: Supervision/Verbal cueing Sit to Supine: Supervision/Verbal cueing Transfers Transfers: Sit to Stand;Stand to Sit;Stand Pivot Transfers Sit to Stand: Supervision/Verbal cueing Stand to Sit: Supervision/Verbal cueing Stand Pivot Transfers: Supervision/Verbal cueing Stand Pivot Transfer Details: Verbal cues for sequencing;Verbal cues for technique;Verbal cues for precautions/safety Transfer (Assistive device): None Locomotion  Gait Ambulation: Yes Gait Assistance: Supervision/Verbal cueing Gait Distance (Feet): 500 Feet Assistive device: None Gait Assistance Details: Verbal cues for precautions/safety Gait Gait: Yes Gait Pattern: Poor foot clearance -  left;Poor foot clearance - right;Decreased trunk rotation High Level Ambulation High Level Ambulation: Side stepping;Backwards walking;Head turns;Sudden stops Side Stepping: WFL Backwards Walking: decreased R step length; decreased velocity Sudden Stops: WFL Head Turns: decreased ability to turn head either horizontally or vertically when ambulating Stairs / Additional Locomotion Stairs: Yes Stairs Assistance: Supervision/Verbal cueing Stair Management Technique: No rails Number of Stairs: 24 Height of Stairs: 6 Ramp: Supervision/Verbal cueing Curb: Supervision/Verbal cueing Wheelchair Mobility Wheelchair Mobility: No  Trunk/Postural Assessment  Cervical Assessment Cervical Assessment: Within Functional Limits Thoracic Assessment Thoracic Assessment: Within Functional Limits Lumbar Assessment Lumbar Assessment: Within Functional  Limits Postural Control Postural Control: Deficits on evaluation(Delayed righting reactions posteriorly>anteriorly)  Balance Balance Balance Assessed: Yes Standardized Balance Assessment Standardized Balance Assessment: Timed Up and Go Test;Berg Balance Test;Dynamic Gait Index;Functional Gait Assessment(Four Square Step Test: 11.7 seconds) Berg Balance Test Sit to Stand: Able to stand without using hands and stabilize independently Standing Unsupported: Able to stand safely 2 minutes Sitting with Back Unsupported but Feet Supported on Floor or Stool: Able to sit safely and securely 2 minutes Stand to Sit: Sits safely with minimal use of hands Transfers: Able to transfer safely, minor use of hands Standing Unsupported with Eyes Closed: Able to stand 10 seconds safely Standing Ubsupported with Feet Together: Able to place feet together independently and stand 1 minute safely From Standing, Reach Forward with Outstretched Arm: Can reach confidently >25 cm (10") From Standing Position, Pick up Object from Floor: Able to pick up shoe safely and easily From Standing Position, Turn to Look Behind Over each Shoulder: Looks behind from both sides and weight shifts well Turn 360 Degrees: Able to turn 360 degrees safely in 4 seconds or less Standing Unsupported, Alternately Place Feet on Step/Stool: Able to stand independently and safely and complete 8 steps in 20 seconds Standing Unsupported, One Foot in Front: Able to take small step independently and hold 30 seconds Standing on One Leg: Able to lift leg independently and hold equal to or more than 3 seconds Total Score: 52 Timed Up and Go Test TUG: Normal TUG Normal TUG (seconds): 9.5 Static Sitting Balance Static Sitting - Balance Support: Feet supported;No upper extremity supported Static Sitting - Level of Assistance: 7: Independent Dynamic Sitting Balance Dynamic Sitting - Balance Support: No upper extremity supported;Feet  supported Dynamic Sitting - Level of Assistance: 7: Independent Dynamic Sitting - Balance Activities: Lateral lean/weight shifting;Forward lean/weight shifting Static Standing Balance Static Standing - Balance Support: No upper extremity supported;During functional activity Static Standing - Level of Assistance: 5: Stand by assistance Static Stance: On foam, eyes closed Static Stance: Eyes Closed: up to 1 min Static Stance: on Foam: >63mn Static Stance: on Foam, Eyes Closed: up to ~30 seconds Dynamic Standing Balance Dynamic Standing - Balance Support: No upper extremity supported;During functional activity Dynamic Standing - Level of Assistance: 5: Stand by assistance Dynamic Standing - Balance Activities: Compliant surfaces;Other (comment)(marches) Functional Gait  Assessment Gait assessed : Yes Gait Level Surface: Walks 20 ft in less than 5.5 sec, no assistive devices, good speed, no evidence for imbalance, normal gait pattern, deviates no more than 6 in outside of the 12 in walkway width. Change in Gait Speed: Able to smoothly change walking speed without loss of balance or gait deviation. Deviate no more than 6 in outside of the 12 in walkway width. Gait with Horizontal Head Turns: Performs head turns smoothly with no change in gait. Deviates no more than 6 in outside 12 in walkway width Gait  with Vertical Head Turns: Performs task with slight change in gait velocity (eg, minor disruption to smooth gait path), deviates 6 - 10 in outside 12 in walkway width or uses assistive device Gait and Pivot Turn: Pivot turns safely in greater than 3 sec and stops with no loss of balance, or pivot turns safely within 3 sec and stops with mild imbalance, requires small steps to catch balance. Step Over Obstacle: Is able to step over 2 stacked shoe boxes taped together (9 in total height) without changing gait speed. No evidence of imbalance. Gait with Narrow Base of Support: Ambulates less than 4 steps  heel to toe or cannot perform without assistance. Gait with Eyes Closed: Walks 20 ft, slow speed, abnormal gait pattern, evidence for imbalance, deviates 10-15 in outside 12 in walkway width. Requires more than 9 sec to ambulate 20 ft. Ambulating Backwards: Walks 20 ft, uses assistive device, slower speed, mild gait deviations, deviates 6-10 in outside 12 in walkway width. Steps: Alternating feet, no rail. Total Score: 22 Extremity Assessment  RUE Assessment RUE Assessment: Within Functional Limits LUE Assessment LUE Assessment: Within Functional Limits RLE Assessment RLE Assessment: Within Functional Limits General Strength Comments: grossly 5/5 LLE Assessment LLE Assessment: Within Functional Limits General Strength Comments: grossly 5/5   Skilled Therapeutic Interventions: Pt received seated in recliner with wife present (attended session). Denies pain and agreeable to PT session. Cognition, motor, mobility, locomotion, balance, postural control, and strength assessed as mentioned above. Berg (52/56) score indicates lower fall risk with static balance activities while FGA (22/30) suggests increased fall risk with dynamic balance. Cognition is the patient's greatest impairment. Pt and wife's questions regarding d/c were answered. Pt remained seated in recliner at the end of the session with wife present.  [place]  09/17/2018, 2:21 PM

## 2018-09-17 NOTE — Progress Notes (Signed)
Speech Language Pathology Discharge Summary  Patient Details  Name: Charles Marquez MRN: [id number] Date of Birth: 02-14-1956  Today's Date: 09/17/2018 SLP Individual Time: 1400-1500 SLP Individual Time Calculation (min): 60 min   Skilled Therapeutic Interventions:  Pt was seen for skilled ST targeting cognitive-linguistic goals.  Upon arrival, pt could recall that he was going home tomorrow with mod visual/contextual cues of pointing to items in his room that had already been packed.  [name] family has been using a memory book with pt to facilitate recall of daily information.  SLP provided skilled education regarding techniques to incorporate information from notebook into structured conversations with pt.  Attempted to have pt read from notebook; however, he continues to have increased complaints of visual disturbances and increased sensitivity to light which made reading text very frustrating for pt.  As a result, task was abandoned.   During picture description tasks, pt was able to spontaneously describe objects and actions in photos at the phrase to short sentence level in >50% of opportunities; however, if pt was unable to immediately generate accurate descriptions he needed anywhere from mod to max multimodal cues to recognize and correct verbal errors.  Throughout tasks, pt needed a combination of explanation of rationale behind tasks and redirection to task to keep him on topic as he exhibits a tendency to perseverate on the "why" of task difficulty which distracts him from effective task completion.  Pt needed mod-max cues for route finding when ambulating back to the hallway where his room is located; however, once he was oriented to the correct hallway, pt could find his room number independently for the first time since being admitted.  Pt was left in recliner with family at bedside.  All questions were answered to their satisfaction at this time.  Pt is ready for discharge tomorrow with  plans for OP ST follow up to continue to address cognitive-linguistic function.      Patient has met 5 of 5 long term goals.  Patient to discharge at overall Mod;Max level.  Reasons goals not met:     Clinical Impression/Discharge Summary:   Pt has made functional gains while inpatient and is discharging having met 5 out of 5 short term goals.  It should be noted that goals were downgraded recently to account for slow and inconsistent progress over the last several days; however, since initial admission pt has made significant gains in his cognitive-linguistic function.  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  Pt/family education is complete at this time.  Pt has demonstrated improved verbal expression at the sentence level and improved awareness of verbal errors; however, he remains limited by decreased carryover of information, decreased ability to correct errors, right inattention, and decreased functional problem solving.  Pt is discharging home with recommendations for ST follow up at next level of care and 24/7 supervision from family and friends.     Care Partner:  Caregiver Able to Provide Assistance: Yes  Type of Caregiver Assistance: Physical;Cognitive  Recommendation:  Outpatient SLP;24 hour supervision/assistance  Rationale for SLP Follow Up: Maximize functional communication;Maximize cognitive function and independence;Reduce caregiver burden   Equipment: none recommended by SLP    Reasons for discharge: Discharged from hospital   Patient/Family Agrees with Progress Made and Goals Achieved: Yes    Charles Marquez, Charles Marquez 09/17/2018, 4:04 PM

## 2018-09-17 NOTE — Discharge Summary (Addendum)
Charles Marquez, Charles Marquez MEDICAL RECORD ZO:[id number] ACCOUNT [id number] DATE OF BIRTH:[birth year] FACILITY: [facility] LOCATION: [facility] PHYSICIAN:Zen Felling, MD  DISCHARGE SUMMARY  DATE OF DISCHARGE:  09/18/2018  ADMIT DATE:  09/10/2018  DATE OF DISCHARGE:  09/18/2018   DISCHARGE DIAGNOSES: 1.  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   2.  Sequential compression devices for deep venous thrombosis prophylaxis. 3.  Pain management. 4.  Constipation. 5.  Hyperlipidemia.   6.  Acute blood loss anemia.  HOSPITAL COURSE:  This is a 62 year old right-handed male with history of hyperlipidemia, CVA with loop recorder insertion, right occipital VP shunt 2016.  Lives with wife.  Working for [employer].  The patient with recent admission 08/04/2018 for  unsteadiness of gait, nausea and vomiting, findings of malfunction VP shunt with revision 08/05/2018.  Discharged to home to be readmitted 08/08/2018-08/22/2018 for VP shunt infection, organism isolated Klebsiella.  He again underwent removal of VP shunt  and placement of external ventriculostomy.  He completed a 2-week course of IV antibiotics.  Followup scan showing right hydrocephalus.  Per report, mildly enlarged right lateral ventricle, but left ventricle, third ventricle and fourth ventricle were  small.  Noted generalized decline and unsteady gait again admitted 08/26/2018 for shunt revision ventriculoperitoneal per Dr. Jordan Likes.  Subcutaneous heparin for DVT prophylaxis 08/29/2018.  He was admitted to inpatient rehabilitation services 08/30/2018  with slow progress, progressive gains, generalized decline with increasing somnolence on 09/01/2018 with followup CT of the head showing stable appearance of ventriculomegaly with unchanged positioning.  A ventriculostomy catheter.  Follow up with  neurosurgery.  Suspect VP shunt malfunction.  He was discharged to acute care services again undergoing  shunt revision per Dr. Jordan Likes.  Latest cranial CT scan showing nearly resolved hydrocephalus.  He was readmitted to resume inpatient rehabilitation  services.  PAST MEDICAL HISTORY:  See discharge diagnoses.  SOCIAL HISTORY:  Lives with his wife.  FUNCTIONAL STATUS:  Upon admission to rehab services was moderate assist with stand pivot transfers, minimal assist 150 feet, 1 person handheld assist min mod assist activities of daily living.  PHYSICAL EXAMINATION: VITAL SIGNS:  Blood pressure 128/91, pulse 117, temperature 97, respirations 20. GENERAL:  Alert male, oriented to month and person with some prompting.  Followed simple commands, but apraxic delay in processing.  He tends to perseverate on a given task. HEENT:  EOMs intact. NECK:  Supple, nontender, no JVD. CARDIOVASCULAR:  Rate controlled. ABDOMEN:  Soft, nontender, good bowel sounds. LUNGS:  Clear to auscultation without wheeze.  REHABILITATION HOSPITAL COURSE:  The patient was admitted to inpatient rehabilitation services.  Therapies initiated on a 3-hour daily basis, consisting of physical therapy, occupational therapy, speech therapy and rehabilitation nursing.  The following  issues were addressed during patient's rehabilitation stay.  Pertaining to the patient's ventriculoperitoneal shunt with multiple revisions, follow up per neurosurgery.  Latest cranial CT scan showing no hydrocephalus.  SCDs for DVT prophylaxis.  Blood  pressure is controlled.  He remained on Lipitor for hyperlipidemia.  The patient received weekly collaborative interdisciplinary team conferences.  He was ambulating 250 feet supervision sit to stand, stand pivot transfers throughout sessions, contact guard for throwing and catching a baseball standing on [employer].  Contact  guard assist for stair negotiation, activities of daily living and homemaking, emphasis on cognitive remediation memory and sequencing.  He did have occasional bouts of dizziness, which  neurosurgery was made aware of.  Cranial CT scan as noted and was  repeated showing no  acute changes.  It was discussed the need for possible outpatient ophthalmology testing.  Full family teaching completed.  Discharge home.  DISCHARGE MEDICATIONS:  Included Lipitor 40 mg p.o. daily, Pepcid 20 mg p.o. b.i.d., multivitamin daily, Senokot-S 1 tablet p.o. at bedtime, Ultram 50 mg at bedtime as needed.  DIET:  Regular.  FOLLOWUP:  With Dr. Maryla Morrow at the outpatient rehab service office as directed; Dr. Julio Sicks of neurosurgery, call for appointment; Dr. Antony Haste, medical management.  SPECIAL INSTRUCTIONS:  No driving.  TN/NUANCE Z:[id number] T:09/17/2018 JOB:[id number]  Patient seen and examined by me on day of discharge. Maryla Morrow, MD, ABPMR

## 2018-09-17 NOTE — Discharge Summary (Signed)
Discharge summary job # [id number]

## 2018-09-17 NOTE — Progress Notes (Signed)
[facility] PHYSICAL MEDICINE & REHABILITATION PROGRESS NOTE  Subjective/Complaints: Patient seen sitting up in his chair this morning.  Wife present.  Patient believes he slept well overnight.  He appears to indicate improvement in vision since yesterday.  Wife notes that patient woke up this morning stating that his eyes hurt.  ROS: Appears to deny CP, SOB, nausea, vomiting, diarrhea, however?  Reliability.  Objective: Vital Signs: Blood pressure 123/90, pulse 97, temperature 98.5 F (36.9 C), temperature source Oral, resp. rate 12, height 5\' 11"  (1.803 m), weight 78.9 kg, SpO2 95 %. Ct Head Wo Contrast  Result Date: 09/16/2018 CLINICAL DATA:  Visual loss. EXAM: CT HEAD WITHOUT CONTRAST TECHNIQUE: Contiguous axial images were obtained from the base of the skull through the vertex without intravenous contrast. COMPARISON:  CT scan of September 05, 2018. FINDINGS: Brain: Distal tip of left parietal ventriculostomy catheter is seen crossing midline into right lateral ventricle. No significant ventricular dilatation is noted. There is no evidence of hemorrhage, mass lesion or acute infarction. No mass effect or midline shift is noted. Old right posterior parietal encephalomalacia is noted. Vascular: No hyperdense vessel or unexpected calcification. Skull: Old right frontal and posterior parietal ventriculostomy sites are noted. Stable current left posterior parietal ventriculostomy catheter is noted. Sinuses/Orbits: No acute finding. Other: None. IMPRESSION: Stable position of left-sided ventriculostomy catheter. No significant ventricular dilatation is noted. Electronically Signed   By: Lupita Raider, M.D.   On: 09/16/2018 12:41   No results for input(s): WBC, HGB, HCT, PLT in the last 72 hours. Recent Labs    09/16/18 1217  NA 138  K 4.4  CL 102  CO2 30  GLUCOSE 139*  BUN 23*  CREATININE 1.00  CALCIUM 9.1    Physical Exam: BP 123/90 (BP Location: Right Arm)   Pulse 97   Temp 98.5 F  (36.9 C) (Oral)   Resp 12   Ht 5\' 11"  (1.803 m)   Wt 78.9 kg   SpO2 95%   BMI 24.26 kg/m  Constitutional: He appearswell-developedand well-nourished.No distress.  HENT: Mild left posterior head edema Eyes:EOMI.  No discharge.  Cardiovascular: RRR.  No JVD. Respiratory:Effort normal.  Clear. WU:JWJXB sounds normal.  He exhibitsno distension.  Neurological: Alert and oriented x 1 Expressive aphasia with apraxia Motor: LUE/LLE: 5/5 proximal distal RUE: 4+-5/5 proximal more distal, unchanged RLE: 4+-5/5 proximal to distal, unchanged Psychiatric: He has anormal mood and affect. Hisbehavior is normal.  Assessment/Plan: 1. 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  Physiatrist is providing close team supervision and 24 hour management of active medical problems listed below.  Physiatrist and rehab team continue to assess barriers to discharge/monitor patient progress toward functional and medical goals  Care Tool:  Bathing    Body parts bathed by patient: Right arm, Left arm, Chest, Abdomen, Front perineal area, Buttocks, Right upper leg, Left upper leg, Right lower leg, Left lower leg, Face         Bathing assist Assist Level: Supervision/Verbal cueing     Upper Body Dressing/Undressing Upper body dressing   What is the patient wearing?: Pull over shirt    Upper body assist Assist Level: Supervision/Verbal cueing    Lower Body Dressing/Undressing Lower body dressing      What is the patient wearing?: Pants     Lower body assist Assist for lower body dressing: Supervision/Verbal cueing     Toileting  Toileting    Toileting assist Assist for toileting: Supervision/Verbal cueing Assistive Device Comment: (condom cath)   Transfers Chair/bed transfer  Transfers assist     Chair/bed transfer assist level:  Supervision/Verbal cueing     Locomotion Ambulation   Ambulation assist      Assist level: Supervision/Verbal cueing Assistive device: Other (comment)(none ) Max distance: 250   Walk 10 feet activity   Assist     Assist level: Supervision/Verbal cueing Assistive device: Other (comment)(none)   Walk 50 feet activity   Assist    Assist level: Supervision/Verbal cueing Assistive device: Other (comment)(none )    Walk 150 feet activity   Assist    Assist level: Supervision/Verbal cueing Assistive device: Other (comment)(none )    Walk 10 feet on uneven surface  activity   Assist     Assist level: Contact Guard/Touching assist Assistive device: Other (comment)(none )   Wheelchair     Assist Will patient use wheelchair at discharge?: No   Wheelchair activity did not occur: N/A         Wheelchair 50 feet with 2 turns activity    Assist    Wheelchair 50 feet with 2 turns activity did not occur: N/A       Wheelchair 150 feet activity     Assist Wheelchair 150 feet activity did not occur: N/A          Medical Problem List and Plan: 1.Unsteady gait with aphasiasecondary to 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  Discussed with and evaluated by neurosurgery yesterday. 2. DVT Prophylaxis/Anticoagulation: SCDs. Monitor for any signs of DVT 3. Pain Management:Ultram as needed 4. Mood:Provide emotional support 5. Neuropsych: This patientisnot capable of making decisions on hisown behalf. 6. Skin/Wound Care:Routine skin checks 7. Fluids/Electrolytes/Nutrition:Routine in and outs   BMP within acceptable range on 11/4 8.Constipation. Laxative assistance  Overall  stable 9.Hyperlipidemia. Lipitor 10.  Acute blood loss anemia  Hemoglobin 11.6 on 11/1  Continue to monitor 11.  Hypoalbuminemia  Supplement initiated on 10/30   LOS: 7 days A FACE TO FACE EVALUATION WAS PERFORMED   Karis Juba 09/17/2018, 9:16 AM

## 2018-09-18 MED ORDER — ACETAMINOPHEN 325 MG PO TABS: 650.0000 mg | ORAL_TABLET | ORAL | Status: DC | PRN

## 2018-09-18 MED ORDER — FAMOTIDINE 20 MG PO TABS: 20.0000 mg | ORAL_TABLET | Freq: Two times a day (BID) | ORAL | 0 refills | Status: DC

## 2018-09-18 MED ORDER — TRAMADOL HCL 50 MG PO TABS: 50.0000 mg | ORAL_TABLET | Freq: Every evening | ORAL | 0 refills | Status: DC | PRN

## 2018-09-18 MED ORDER — ATORVASTATIN CALCIUM 40 MG PO TABS: 40.0000 mg | ORAL_TABLET | Freq: Every day | ORAL | 3 refills | Status: AC

## 2018-09-18 NOTE — Progress Notes (Signed)
Pt d/c home with personal belongings. Discharge instructions provided to pt and family with verbalization of understanding medications, follow up appts, equip brought to bedside.

## 2018-09-18 NOTE — Plan of Care (Signed)
D/C with home.

## 2018-09-19 ENCOUNTER — Other Ambulatory Visit: Payer: Self-pay

## 2018-09-19 ENCOUNTER — Telehealth: Payer: Self-pay | Admitting: Registered Nurse

## 2018-09-19 ENCOUNTER — Ambulatory Visit: Payer: BLUE CROSS/BLUE SHIELD | Attending: Physical Medicine & Rehabilitation

## 2018-09-19 ENCOUNTER — Ambulatory Visit: Payer: BLUE CROSS/BLUE SHIELD | Admitting: Occupational Therapy

## 2018-09-19 ENCOUNTER — Encounter: Payer: Self-pay | Admitting: Occupational Therapy

## 2018-09-19 ENCOUNTER — Ambulatory Visit: Payer: BLUE CROSS/BLUE SHIELD | Admitting: Speech Pathology

## 2018-09-19 DIAGNOSIS — R4701 Aphasia: Secondary | ICD-10-CM | POA: Insufficient documentation

## 2018-09-19 DIAGNOSIS — R2689 Other abnormalities of gait and mobility: Secondary | ICD-10-CM | POA: Insufficient documentation

## 2018-09-19 DIAGNOSIS — R482 Apraxia: Secondary | ICD-10-CM | POA: Insufficient documentation

## 2018-09-19 DIAGNOSIS — I69918 Other symptoms and signs involving cognitive functions following unspecified cerebrovascular disease: Secondary | ICD-10-CM | POA: Diagnosis present

## 2018-09-19 DIAGNOSIS — R41842 Visuospatial deficit: Secondary | ICD-10-CM

## 2018-09-19 DIAGNOSIS — R2681 Unsteadiness on feet: Secondary | ICD-10-CM | POA: Insufficient documentation

## 2018-09-19 DIAGNOSIS — M6281 Muscle weakness (generalized): Secondary | ICD-10-CM | POA: Diagnosis present

## 2018-09-19 DIAGNOSIS — R41841 Cognitive communication deficit: Secondary | ICD-10-CM | POA: Insufficient documentation

## 2018-09-19 DIAGNOSIS — I69990 Apraxia following unspecified cerebrovascular disease: Secondary | ICD-10-CM | POA: Diagnosis present

## 2018-09-19 NOTE — Telephone Encounter (Signed)
Placed a call to Home number, no one picked up. Placed a call to Mrs. Lollley cell number, no answer, left message to return the call.  1st attempt.

## 2018-09-19 NOTE — Therapy (Signed)
[facility] [address] [place], [place], [address] Phone: [phone]   Fax:  [phone]  Occupational Therapy Evaluation  Patient Details  Name: Charles Marquez MRN: [id number] Date of Birth: 06/24/1956 Referring Provider (OT): Dr. Marcello Fennel   Encounter Date: 09/19/2018  OT End of Session - 09/19/18 1714    Visit Number  1    Number of Visits  9    Date for OT Re-Evaluation  10/17/18    Authorization Type  [employer] 90 visits for all disciplines    OT Start Time  1445    OT Stop Time  1532    OT Time Calculation (min)  47 min    Activity Tolerance  Patient tolerated treatment well       Past Medical History:  Diagnosis Date  . Anxiety   . Hypercholesteremia   . Stroke (HCC)    tia's  . TIA (transient ischemic attack)    09.15    Past Surgical History:  Procedure Laterality Date  . Fractured arm Left 12  . HERNIA REPAIR Right 3/12  . LAPAROSCOPIC REVISION VENTRICULAR-PERITONEAL (V-P) SHUNT N/A 08/26/2018   Procedure: LAPAROSCOPIC INSERTION VENTRICULAR-PERITONEAL (V-P) SHUNT;  Surgeon: Julio Sicks, MD;  Location: [facility] OR;  Service: Neurosurgery;  Laterality: N/A;  . LOOP RECORDER INSERTION N/A 04/10/2017   Procedure: Loop Recorder Insertion;  Surgeon: Hillis Range, MD;  Location: [facility] INVASIVE CV LAB;  Service: Cardiovascular;  Laterality: N/A;  . SHUNT REMOVAL Right 03/13/2016   Procedure: SHUNT REMOVAL;  Surgeon: Julio Sicks, MD;  Location: [facility] NEURO ORS;  Service: Neurosurgery;  Laterality: Right;  . SHUNT REMOVAL Right 08/09/2018   Procedure: SHUNT REMOVAL With Placement of Ventricular Catheter;  Surgeon: Lisbeth Renshaw, MD;  Location: [facility] OR;  Service: Neurosurgery;  Laterality: Right;  . SHUNT REVISION Right 08/05/2018   Procedure: SHUNT REVISION;  Surgeon: Julio Sicks, MD;  Location: [facility] OR;  Service: Neurosurgery;  Laterality: Right;  . SHUNT REVISION VENTRICULAR-PERITONEAL Left 08/26/2018   Procedure: SHUNT REVISION VENTRICULAR-PERITONEAL;  Surgeon: Julio Sicks, MD;  Location: [facility] OR;  Service: Neurosurgery;  Laterality: Left;  . SHUNT REVISION VENTRICULAR-PERITONEAL Left 09/02/2018   Procedure: Left Occipital VP shunt revision;  Surgeon: Julio Sicks, MD;  Location: [facility] OR;  Service: Neurosurgery;  Laterality: Left;  [name] VASECTOMY  10/02/1997  . 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 VENTRICULOPERITONEAL SHUNT Right 07/22/2018   Procedure: Shunt Placment right occipital;  Surgeon: Julio Sicks, MD;  Location: [facility] OR;  Service: Neurosurgery;  Laterality: Right;  . VENTRICULOPERITONEAL SHUNT Left 08/26/2018   Procedure: LEFT SIDED VENTRICULAR-PERITONEAL SHUNT;  Surgeon: Julio Sicks, MD;  Location: [facility] OR;  Service: Neurosurgery;  Laterality: Left;  [name] VENTRICULOSTOMY Right 08/09/2018   Procedure: VENTRICULOSTOMY;  Surgeon: Lisbeth Renshaw, MD;  Location: [facility] OR;  Service: Neurosurgery;  Laterality: Right;    There were no vitals filed for this visit.  Subjective Assessment - 09/19/18 1451    Subjective   Pt is aphasic - wife reports light sensitivity but actual pain in eyes.     Patient is accompained by:  Family member   Wife Kim and Neurosurgeon   Pertinent History  S/p multiple VP shunt revisions, shunt infection, hydrocephalus    Currently in Pain?  No/denies  [facility] OT Assessment - 09/19/18 1454      Assessment   Medical Diagnosis  s/p multiple shunt VP revisions, hydrocephalus    Referring Provider (OT)  Dr. Marcello Fennel    Onset Date/Surgical Date  08/26/18    Hand Dominance  Right    Prior Therapy  inpt rehab OT, PT and ST      Precautions   Precautions  Fall      Restrictions   Weight Bearing Restrictions  Yes    Other Position/Activity Restrictions   Lifting restrictions to 10 pounds in place until after Thanksgiving      Balance Screen   Has the patient fallen in the past 6 months  Yes   Pt seeing PT in this clinic     Home  Environment   Family/patient expects to be discharged to:  Private residence    Living Arrangements  Spouse/significant other    Available Help at Discharge  Available PRN/intermittently    Type of Home  House    Home Layout  Two level    Bathroom Shower/Tub  Tub/Shower unit    Hydrographic surveyor    Additional Comments  Bathroom equipment - pt has shower seat.  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[employer], IT department   Leisure  Officiating basketball games, umpire baseball, boating, yard work      ADL   Eating/Feeding  Independent    Grooming  Independent    Product manager  Supervision/safety   wife cues for sequencing   Lower Body Bathing  Supervision/safety   sequencing   Upper Body Dressing  Supervision/safety   cues for sequencing and organization   Lower Body Dressing  Supervision/safety   cueing for sequencing and Artist  Supervision/safety   cues to remind and supervision for balance   Toileting - Recruitment consultant -  Geneticist, molecular  Supervision/safety      IADL   Shopping  Completely unable to shop    Light Housekeeping  Does not participate in any housekeeping tasks    Meal Prep  Needs to have meals prepared and served    [employer] on family or friends for transportation    Medication Management  Is not capable of dispensing or managing own medication    Financial Management  Dependent      Mobility   Mobility Status  History of falls      Written Expression   Dominant Hand  Right      Vision - History   Baseline Vision  Wears glasses all the time      Vision Assessment    Eye Alignment  Impaired (comment)   mild   Ocular Range of Motion  Within Functional Limits    Tracking/Visual Pursuits  Able to track stimulus in all quads without difficulty    Saccades  --   pt unable to follow directions   Convergence  Within functional limits    Visual Fields  Right visual field deficit   inferior field   Diplopia Assessment  --   had initiallly does not appear to have now will monitor   Depth Perception  --   wife reports issues prior however more exaggerated now  Comment  Will continue to asses visual vestibular integration via functional tasks as well as depth perception and convergence.  Wife reports astigmatism and prior issues with depth perception because of it.  Depth perception is currently much more impaired.        Activity Tolerance   Activity Tolerance  Tolerate 30+ min activity without fatigue      Cognition   Overall Cognitive Status  Impaired/Different from baseline    Attention  Selective;Alternating;Divided    Selective Attention  Impaired    Alternating Attention  Impaired    Divided Attention  Impaired    Memory  Impaired    Awareness  Impaired    Problem Solving  Impaired    Executive Function  --   All executive functioning impaired.  sequencing impaired   Cognition Comments  Cognition assessment complicated by langage impairments. Will continue to assess via functional activity. Wife reports mod cueing for basic tasks such as bathing and dressing, gathering clothes, washing dishes, etc.       Sensation   Light Touch  Appears Intact    Hot/Cold  Appears Intact    Proprioception  Appears Intact    Additional Comments  For UE's      Coordination   Gross Motor Movements are Fluid and Coordinated  Yes    Fine Motor Movements are Fluid and Coordinated  Yes    Other  Pt had benign tremor prior to recent events      Perception   Perception  Impaired      Praxis   Praxis  Impaired    Praxis-Other Comments  evident with ROM and MMT -  will continue to monitor functionally      ROM / Strength   AROM / PROM / Strength  AROM;Strength      AROM   Overall AROM   Within functional limits for tasks performed    Overall AROM Comments  BUE's      Strength   Overall Strength  Within functional limits for tasks performed    Overall Strength Comments  for BUE's      Hand Function   Right Hand Gross Grasp  Functional    Right Hand Grip (lbs)  100    Left Hand Gross Grasp  Functional    Left Hand Grip (lbs)  85                        OT Short Term Goals - 09/19/18 1653      OT SHORT TERM GOAL #1   Title  --        OT Long Term Goals - 09/19/18 1653      OT LONG TERM GOAL #1   Title  Pt and family will be mod I with home activities program to address self care and cognition via functional tasks and structured schedule - 10/17/2108    Status  New      OT LONG TERM GOAL #2   Title  Pt will be mod I with basic ADL tasks     Status  New      OT LONG TERM GOAL #3   Title  Pt will demonstrate sufficient attention to attend to familiar functional task in moderately busy environment.    Status  New      OT LONG TERM GOAL #4   Title  Pt will be mod I with sequencing for basic self care tasks    Status  New      OT LONG TERM GOAL #5   Title  Pt will be supervision for simple, familiar home mgmt tasks    Status  New      OT LONG TERM GOAL #6   Title  Pt will be able to copy first and last name    Status  New            Plan - 09/19/18 1656    Clinical Impression Statement  Pt is a 62 year old male s/p VP shunt replacement (multiple revisions) following infection and ventriculostomy - pt admitted to hospital on 07/22/2018 for first shunt placement, then revised due to infection on 08/05/2018.  On 08/09/2018 shunt removed and ventriculostomy was placed.  On 08/26/2018 surgery (x2 on same day) followed by final VP revision on 09/02/2018 with slowly resolving hydrcephalus.  Pt had previous shunt placed  in 2016 and removed in 2017.  Pt presents with signficant cognitive deficits , visual deficits, apraxia and high level balance deficits. Pt also presenting with language based defiicts.  Pt will benefit from skilled OT to address these deficits and maximize independence.      Occupational Profile and client history currently impacting functional performance  PMH: multiple shunt revisions, anxiety, TIA, LUE fx, communicating hydrocelphalus.     Occupational performance deficits (Please refer to evaluation for details):  ADL's;IADL's;Rest and Sleep;Work;Leisure;Social Participation    Rehab Potential  Good   for goals   OT Frequency  2x / week    OT Duration  4 weeks    OT Treatment/Interventions  Self-care/ADL training;Neuromuscular education;Building services engineer;Therapeutic activities;Cognitive remediation/compensation;Visual/perceptual remediation/compensation;Patient/family education;Balance training    Clinical Decision Making  Several treatment options, min-mod task modification necessary    Consulted and Agree with Plan of Care  Patient       Patient will benefit from skilled therapeutic intervention in order to improve the following deficits and impairments:  Decreased balance, Decreased cognition, Decreased safety awareness, Decreased mobility, Impaired vision/preception  Visit Diagnosis: Other symptoms and signs involving cognitive functions following unspecified cerebrovascular disease - Plan: Ot plan of care cert/re-cert  Unsteadiness on feet - Plan: Ot plan of care cert/re-cert  Visuospatial deficit - Plan: Ot plan of care cert/re-cert  Apraxia - Plan: Ot plan of care cert/re-cert    Problem List Patient Active Problem List   Diagnosis Date Noted  . Visual disturbance   . Slow transit constipation   . Hypoalbuminemia due to protein-calorie malnutrition (HCC)   . Acute blood loss anemia   . S/P VP shunt   . Hydrocephalus (HCC) 08/30/2018  . Dyslipidemia   .  History of CVA (cerebrovascular accident)   . Benign essential HTN   . Tachycardia   . Leukocytosis   . Hyponatremia   . Hypokalemia   . 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 Bacterial meningitis 08/09/2018  . TIA (transient ischemic attack) 01/29/2017  . Acute encephalopathy   . Shunt malfunction 03/13/2016  . 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 Hyperlipidemia 10/20/2014  . Degenerative disc disease, lumbar 04/15/2013  . Routine general medical examination at a health care facility 07/23/2012  . DISTURBANCE OF SKIN SENSATION 10/05/2008  . HYPERLIPIDEMIA 01/01/2008  . MYCOPLASMA PNEUMONIA 01/01/2008    Norton Pastel, OTR/L 09/19/2018, 5:18 PM  [facility] [address] [place], [place], [address] Phone: [phone]   Fax:  [phone]  Name: Charles Marquez MRN: [id number] Date of Birth: December 06, 1955

## 2018-09-19 NOTE — Therapy (Signed)
[facility] [address] [place], [place], [address] Phone: [phone]   Fax:  [phone]  Physical Therapy Evaluation  Patient Details  Name: Charles Marquez MRN: [id number] Date of Birth: [birth year] Referring Provider (PT): Dr. Allena Katz    Encounter Date: 09/19/2018  PT End of Session - 09/19/18 1525    Visit Number  1    Number of Visits  17    Date for PT Re-Evaluation  11/18/18    Authorization Type  [employer] OTHER     PT Start Time  1315    PT Stop Time  1400    PT Time Calculation (min)  45 min    Equipment Utilized During Treatment  Gait belt    Activity Tolerance  Patient tolerated treatment well    Behavior During Therapy  [facility] for tasks assessed/performed       Past Medical History:  Diagnosis Date  . Anxiety   . Hypercholesteremia   . Stroke (HCC)    tia's  . TIA (transient ischemic attack)    09.15    Past Surgical History:  Procedure Laterality Date  . Fractured arm Left 12  . HERNIA REPAIR Right 3/12  . LAPAROSCOPIC REVISION VENTRICULAR-PERITONEAL (V-P) SHUNT N/A 08/26/2018   Procedure: LAPAROSCOPIC INSERTION VENTRICULAR-PERITONEAL (V-P) SHUNT;  Surgeon: Julio Sicks, MD;  Location: [facility] OR;  Service: Neurosurgery;  Laterality: N/A;  . LOOP RECORDER INSERTION N/A 04/10/2017   Procedure: Loop Recorder Insertion;  Surgeon: Hillis Range, MD;  Location: [facility] INVASIVE CV LAB;  Service: Cardiovascular;  Laterality: N/A;  . SHUNT REMOVAL Right 03/13/2016   Procedure: SHUNT REMOVAL;  Surgeon: Julio Sicks, MD;  Location: [facility] NEURO ORS;  Service: Neurosurgery;  Laterality: Right;  . SHUNT REMOVAL Right 08/09/2018   Procedure: SHUNT REMOVAL With Placement of Ventricular Catheter;  Surgeon: Lisbeth Renshaw, MD;  Location: [facility] OR;  Service: Neurosurgery;  Laterality: Right;  . SHUNT REVISION Right 08/05/2018   Procedure: SHUNT REVISION;  Surgeon: Julio Sicks, MD;  Location: [facility] OR;  Service: Neurosurgery;   Laterality: Right;  . SHUNT REVISION VENTRICULAR-PERITONEAL Left 08/26/2018   Procedure: SHUNT REVISION VENTRICULAR-PERITONEAL;  Surgeon: Julio Sicks, MD;  Location: [facility] OR;  Service: Neurosurgery;  Laterality: Left;  . SHUNT REVISION VENTRICULAR-PERITONEAL Left 09/02/2018   Procedure: Left Occipital VP shunt revision;  Surgeon: Julio Sicks, MD;  Location: [facility] OR;  Service: Neurosurgery;  Laterality: Left;  [name] VASECTOMY  10/02/1997  . 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 VENTRICULOPERITONEAL SHUNT Right 07/22/2018   Procedure: Shunt Placment right occipital;  Surgeon: Julio Sicks, MD;  Location: [facility] OR;  Service: Neurosurgery;  Laterality: Right;  . VENTRICULOPERITONEAL SHUNT Left 08/26/2018   Procedure: LEFT SIDED VENTRICULAR-PERITONEAL SHUNT;  Surgeon: Julio Sicks, MD;  Location: [facility] OR;  Service: Neurosurgery;  Laterality: Left;  [name] VENTRICULOSTOMY Right 08/09/2018   Procedure: VENTRICULOSTOMY;  Surgeon: Lisbeth Renshaw, MD;  Location: [facility] OR;  Service: Neurosurgery;  Laterality: Right;    There were no vitals filed for this visit.   Subjective Assessment - 09/19/18 1321    Subjective  Pt reports he had a previous shunt revision surgery and continues to experience difficulty with his short term memory. [name] reports he has some difficulty recalling long term memory but experiences most challenge with immediate  short term memory recall. Wife reports that he will often forget within 5 minutes. Wife reports he is progressing well with his physical strength but is no where near his prior level of function. Wife reports he has occassional LOB episodes and issues with higher level balance. Wife reports he often experiences blurred vision, has sensitivity to light, and has mild improvement when  wearing his glasses but wearing his glasses does not always prove to be successful. When therapist questions patient who is with him, patient is unable to recall his daughters name without prompting.     Patient is accompained by:  Family member   Wife [name] and daughter [name]   Pertinent History   S/p VP shunt, Hydrocephalus, CVA with loop recorder insertion 04/10/2017 as well as R occipital VP shunt placement 2016, Infection of ventricular shunt, shunt malfunction, HLD, anxiety, TIA, Hypercholesteremia, acute blood loss anemia, HTN, Tachycardia, Leukocytosis, Hyponatremia, Bacterial Encephalitis, Bacterial Meningitis, Dyslipidemia, DDD lumbar spine, and Mycoplasma Pneumonia.     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  No/denies         [facility] PT Assessment - 09/19/18 1328      Assessment   Medical Diagnosis  Hydrocephalus s/p multiple shunt revisions    Referring Provider (PT)  Dr. Allena Katz     Onset Date/Surgical Date  09/12/18    Next MD Visit  Follows up with Dr. Jordan Likes in 2 weeks    Prior Therapy  CIR PT, OT, & SLP      Precautions   Precautions  Fall      Balance Screen   Has the patient fallen in the past 6 months  Yes    How many times?  2   9/21 & 9/8   Has the patient had a decrease in activity level because of a fear of falling?   Yes    Is the patient reluctant to leave their home because of a fear of falling?   No      Home Environment   Living Environment  Private residence    Living Arrangements  Spouse/significant other    Available Help at Discharge  Family    Type of Home  House    Home Access  Stairs to enter    Entrance Stairs-Number of Steps  5    Entrance Stairs-Rails  Can reach both    Home Layout  Two level    Alternate Level Stairs-Number of Steps  12    Alternate Level Stairs-Rails  Left    Home  Equipment  Shower seat   suction grab bars in shower     Prior Function   Level of Independence  Independent    Vocation  Full time employment   [employer]   Leisure  Officiating basketball games, boating, yard work.       Cognition   Overall Cognitive Status  Impaired/Different from baseline   Pt not oriented to time or place     Sensation   Light Touch  Appears Intact      Coordination   Gross Motor Movements are Fluid and Coordinated  No    Heel Shin Test  Deficits noted with patient unable to perform coordination assessment with adequate speed of movement bilaterally      ROM / Strength   AROM / PROM /  Strength  Strength;AROM      AROM   Overall AROM   Within functional limits for tasks performed      Strength   Overall Strength  Deficits    Overall Strength Comments  Pt demonstrates 4/5 LE strength bilaterally when grossly assessed in sitting via MMT with exception to R hip flexor being -4/5.       Transfers   Transfers  Sit to Stand;Stand to Sit    Sit to Stand  5: Supervision    Five time sit to stand comments   Pt performs 5x STS from standard height chair with no UE assist in 8.91 seconds indicating normal fall risk potential.     Stand to Sit  5: Supervision    Number of Reps  Other reps (comment)   5     Ambulation/Gait   Ambulation/Gait  Yes    Ambulation/Gait Assistance  5: Supervision    Ambulation Distance (Feet)  40 Feet    Assistive device  None    Gait Pattern  Step-through pattern;Decreased arm swing - left;Decreased step length - left;Decreased weight shift to right    Ambulation Surface  Level;Indoor    Gait velocity  4.95 ft/sec indicative of normal walking speed    Stairs  Yes    Stairs Assistance  5: Supervision    Stairs Assistance Details (indicate cue type and reason)  Pt performs initial trial with no HR's and reciprocal stepping. Pt then performs x4 steps with single R HR and reciprocal stepping demonstrating significant improvement in  safety.    Stair Management Technique  No rails;Alternating pattern;One rail Right    Number of Stairs  4   x2 trials   Height of Stairs  6      High Level Balance   High Level Balance Activites  Backward walking;Direction changes;Other (comment)    High Level Balance Comments  Pt performs ambulation outdoors over uneven mulch and gravel with therapist providing min guard. Pt performs multiple trials demonstrating LOB laterally with ability to recover with stepping strategy. Pt then performs backwards ambulation on grass demonstrating smaller steps and decreased speed of movement.       Functional Gait  Assessment   Gait assessed   Yes    Gait Level Surface  Walks 20 ft in less than 5.5 sec, no assistive devices, good speed, no evidence for imbalance, normal gait pattern, deviates no more than 6 in outside of the 12 in walkway width.    Change in Gait Speed  Able to change speed, demonstrates mild gait deviations, deviates 6-10 in outside of the 12 in walkway width, or no gait deviations, unable to achieve a major change in velocity, or uses a change in velocity, or uses an assistive device.    Gait with Horizontal Head Turns  Performs head turns with moderate changes in gait velocity, slows down, deviates 10-15 in outside 12 in walkway width but recovers, can continue to walk.    Gait with Vertical Head Turns  Performs task with slight change in gait velocity (eg, minor disruption to smooth gait path), deviates 6 - 10 in outside 12 in walkway width or uses assistive device    Gait and Pivot Turn  Turns slowly, requires verbal cueing, or requires several small steps to catch balance following turn and stop    Step Over Obstacle  Is able to step over 2 stacked shoe boxes taped together (9 in total height) without changing gait speed. No evidence  of imbalance.    Gait with Narrow Base of Support  Ambulates less than 4 steps heel to toe or cannot perform without assistance.    Gait with Eyes Closed   Walks 20 ft, slow speed, abnormal gait pattern, evidence for imbalance, deviates 10-15 in outside 12 in walkway width. Requires more than 9 sec to ambulate 20 ft.    Ambulating Backwards  Walks 20 ft, slow speed, abnormal gait pattern, evidence for imbalance, deviates 10-15 in outside 12 in walkway width.    Steps  Alternating feet, must use rail.    Total Score  16    FGA comment:  16/30                Objective measurements completed on examination: See above findings.              PT Education - 09/19/18 1524    Education Details  Therapist provided education regarding clinical findings from initial evaluation and POC moving forward with physical therapy.     Person(s) Educated  Patient;Spouse;Child(ren)    Methods  Explanation    Comprehension  Verbalized understanding       PT Short Term Goals - 09/19/18 1525      PT SHORT TERM GOAL #1   Title  Pt will participate in establishment of initial HEP to improve strength, balance, and functional mobility.     Time  4    Period  Weeks    Status  New    Target Date  10/19/18      PT SHORT TERM GOAL #2   Title  Pt will ambulate 500 feet outdoors navigating uneven terrain, curbs, ramps, and perform dual cognitive tasks with therapist providing supervision to improve safety with community distances.     Time  4    Period  Weeks    Status  New    Target Date  10/19/18      PT SHORT TERM GOAL #3   Title  Pt will perform 12 steps with reciprocal stepping technique and single HR demonstrating improvement in safety with community accessibility with therapist providing supervision.     Time  4    Period  Weeks    Status  New    Target Date  10/19/18      PT SHORT TERM GOAL #4   Title  Pt will report no LOB episodes when walking over his gravel drive way indicating improvement in safety with functional mobility at home.    Time  4    Period  Weeks    Status  New    Target Date  10/19/18      PT SHORT TERM GOAL  #5   Title  Pt will improve FGA score to >/= 20/30 indicating improvement in functional mobility.     Baseline  11/7: 16/30 indicating high fall risk potential     Time  4    Period  Weeks    Status  New    Target Date  10/19/18        PT Long Term Goals - 09/19/18 1530      PT LONG TERM GOAL #1   Title  Pt will be independent and compliant with performing is HEP to improve balance, strength, and functional mobility.    Time  8    Period  Weeks    Status  New    Target Date  11/18/18      PT LONG TERM GOAL #2  Title  Pt will ambulate 1000 feet outdoors at mod I level navigating uneven terrain, curbs, ramps, and perform dual cognitive tasks to increase safety with community distances.     Time  8    Period  Weeks    Status  New    Target Date  11/18/18      PT LONG TERM GOAL #3   Title  Pt will demonstrate 12 steps with no HR's at mod I level with good safety awareness to improve community accessibility.     Time  8    Period  Weeks    Status  New    Target Date  11/18/18      PT LONG TERM GOAL #4   Title  Pt will improve FGA score to >/=25/30 indicating improvement in functional mobility.     Time  8    Period  Weeks    Status  New    Target Date  11/18/18             Plan - 09/19/18 1537    Clinical Impression Statement  Pt is a 62 year old male referred to Neuro OPPT for evaluation of functional mobility s/p Hydrocephalus dx with multiple shunt revision surgeries. [name] PMH is significant for the following: Hydrocephalus, CVA with loop recorder insertion 04/10/2017 as well as R occipital VP shunt placement 2016, Infection of ventricular shunt, shunt malfunction, HLD, anxiety, TIA, Hypercholesteremia, acute blood loss anemia, HTN, Tachycardia, Leukocytosis, Hyponatremia, Bacterial Encephalitis, Bacterial Meningitis, Dyslipidemia, DDD lumbar spine, and Mycoplasma Pneumonia.  The following deficits were noted during pt's exam: Patient demonstrates 4/5 LE strength when  grossly assessed in sitting via MMT with exception to R hip flexion being 4-/5. [name] 5x STS time of 8.91 seconds indicates he is considered a normal fall risk potential. [name] gait speed of 4.95 ft/sec is indicative of normal walking speed. [name] FGA score of 16/30 is indicative of higher fall risk potential when performing dynamic challenges during gait. Pt demonstrated increased LOB episodes with ability to recover utilizing stepping strategy when walking outside over mulch and uneven gravel to simulate home environment. Therapist notes coordination deficits when assessed with in ability to increase speed of movement. Pt would benefit from skilled PT to address these impairments and functional limitations to maximize functional mobility independence and reduce falls risk.     History and Personal Factors relevant to plan of care:  S/p VP shunt, Hydrocephalus, CVA with loop recorder insertion 04/10/2017 as well as R occipital VP shunt placement 2016, Infection of ventricular shunt, shunt malfunction, HLD, anxiety, TIA, Hypercholesteremia, acute blood loss anemia, HTN, Tachycardia, Leukocytosis, Hyponatremia, Bacterial Encephalitis, Bacterial Meningitis, Dyslipidemia, DDD lumbar spine, and Mycoplasma Pneumonia. Pt has significant short term memory with poor recall ability.      Clinical Presentation  Evolving    Clinical Presentation due to:  S/p VP shunt, Hydrocephalus, CVA with loop recorder insertion 04/10/2017 as well as R occipital VP shunt placement 2016, Infection of ventricular shunt, shunt malfunction, HLD, anxiety, TIA, Hypercholesteremia, acute blood loss anemia, HTN, Tachycardia, Leukocytosis, Hyponatremia, Bacterial Encephalitis, Bacterial Meningitis, Dyslipidemia, DDD lumbar spine, and Mycoplasma Pneumonia. Pt has significant short term memory with poor recall ability.      Clinical Decision Making  Moderate    Rehab Potential  Good    Clinical Impairments Affecting Rehab Potential  Pt has short term  memory deficits and cognitive changes from baseline which may negatively influence his ability to recall exercises and progressions in therapy.  PT Frequency  2x / week    PT Duration  8 weeks    PT Treatment/Interventions  ADLs/Self Care Home Management;Therapeutic activities;Dry needling;Therapeutic exercise;Balance training;Neuromuscular re-education;Manual techniques;Visual/perceptual remediation/compensation;Patient/family education;Stair training;Gait training;Electrical Stimulation;Functional mobility training;Passive range of motion;DME Instruction    PT Next Visit Plan  establish HEP; high level balance and strengthening in quadriped/ tall kneeling    PT Home Exercise Plan  tbd    Consulted and Agree with Plan of Care  Patient;Family member/caregiver    Family Member Consulted  wife- kim & daughter- michelle       Patient will benefit from skilled therapeutic intervention in order to improve the following deficits and impairments:  Abnormal gait, Decreased coordination, Decreased safety awareness, Impaired vision/preception, Decreased balance, Decreased cognition, Decreased mobility, Decreased strength  Visit Diagnosis: Muscle weakness (generalized)  Unsteadiness on feet  Other abnormalities of gait and mobility     Problem List Patient Active Problem List   Diagnosis Date Noted  . Visual disturbance   . Slow transit constipation   . Hypoalbuminemia due to protein-calorie malnutrition (HCC)   . Acute blood loss anemia   . S/P VP shunt   . Hydrocephalus (HCC) 08/30/2018  . Dyslipidemia   . History of CVA (cerebrovascular accident)   . Benign essential HTN   . Tachycardia   . Leukocytosis   . Hyponatremia   . Hypokalemia   . 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 Bacterial meningitis 08/09/2018  . TIA (transient ischemic attack) 01/29/2017  . Acute encephalopathy   . Shunt malfunction 03/13/2016  . Small vessel  disease, cerebrovascular 01/25/2015  . 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 Hyperlipidemia 10/20/2014  . Degenerative disc disease, lumbar 04/15/2013  . Routine general medical examination at a health care facility 07/23/2012  . DISTURBANCE OF SKIN SENSATION 10/05/2008  . HYPERLIPIDEMIA 01/01/2008  . MYCOPLASMA PNEUMONIA 01/01/2008    Carney Living, SPT 09/19/2018, 3:48 PM  [facility] [address] [place], [place], [address] Phone: [phone]   Fax:  [phone]  Name: Charles Marquez MRN: [id number] Date of Birth: 1956-11-06

## 2018-09-20 NOTE — Telephone Encounter (Signed)
Transitional care call 2nd attempt   I spoke with patients wife. I attempted to establish appointment.  Patient's wife politely interrupted and stated, 'We will not be following up with Dr. Allena Katz. We would follow up with Dr. Riley Kill, but we will not follow up with Dr. Allena Katz'.  I asked for reason and she stated that Dr. Allena Katz knew the patient was a brain injury, and yet he peppered the patient with questions that caused a lot of confusion and emotional grief.  Basically, they were very disappointed with Dr. Jill Alexanders bedside manner. They said if Dr. Riley Kill is not available. They will follow up with general practitioner. I told the patient's wife I would forward to our clinic manager, Wadie Lessen.  Patient's wife verbalized understanding.

## 2018-09-20 NOTE — Therapy (Signed)
[facility] [address] [place], [place], [address] Phone: [phone]   Fax:  [phone]  Speech Language Pathology Evaluation  Patient Details  Name: Charles Marquez MRN: [id number] Date of Birth: 02-10-1956 Referring Provider (SLP): Dr. Allena Katz   Encounter Date: 09/19/2018  End of Session - 09/20/18 1926    Visit Number  1    Number of Visits  17    Date for SLP Re-Evaluation  11/18/18    Authorization Type  BCBS 90 visits for all disciplines     SLP Start Time  1402    SLP Stop Time   1445    SLP Time Calculation (min)  43 min    Activity Tolerance  Patient tolerated treatment well       Past Medical History:  Diagnosis Date  . Anxiety   . Hypercholesteremia   . Stroke (HCC)    tia's  . TIA (transient ischemic attack)    09.15    Past Surgical History:  Procedure Laterality Date  . Fractured arm Left 12  . HERNIA REPAIR Right 3/12  . LAPAROSCOPIC REVISION VENTRICULAR-PERITONEAL (V-P) SHUNT N/A 08/26/2018   Procedure: LAPAROSCOPIC INSERTION VENTRICULAR-PERITONEAL (V-P) SHUNT;  Surgeon: Julio Sicks, MD;  [facility]: [facility] OR;  Service: Neurosurgery;  Laterality: N/A;  . LOOP RECORDER INSERTION N/A 04/10/2017   Procedure: Loop Recorder Insertion;  Surgeon: Hillis Range, MD;  [facility]: [facility] INVASIVE CV LAB;  Service: Cardiovascular;  Laterality: N/A;  . SHUNT REMOVAL Right 03/13/2016   Procedure: SHUNT REMOVAL;  Surgeon: Julio Sicks, MD;  Location: [facility] NEURO ORS;  Service: Neurosurgery;  Laterality: Right;  . SHUNT REMOVAL Right 08/09/2018   Procedure: SHUNT REMOVAL With Placement of Ventricular Catheter;  Surgeon: Lisbeth Renshaw, MD;  Location: [facility] OR;  Service: Neurosurgery;  Laterality: Right;  . SHUNT REVISION Right 08/05/2018   Procedure: SHUNT REVISION;  Surgeon: Julio Sicks, MD;  Location: [facility] OR;  Service: Neurosurgery;  Laterality: Right;  . SHUNT REVISION VENTRICULAR-PERITONEAL Left 08/26/2018   Procedure: SHUNT REVISION VENTRICULAR-PERITONEAL;  Surgeon: Julio Sicks, MD;  Location: [facility] OR;  Service: Neurosurgery;  Laterality: Left;  . SHUNT REVISION VENTRICULAR-PERITONEAL Left 09/02/2018   Procedure: Left Occipital VP shunt revision;  Surgeon: Julio Sicks, MD;  Location: [facility] OR;  Service: Neurosurgery;  Laterality: Left;  [name] VASECTOMY  10/02/1997  . 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 VENTRICULOPERITONEAL SHUNT Right 07/22/2018   Procedure: Shunt Placment right occipital;  Surgeon: Julio Sicks, MD;  Location: [facility] OR;  Service: Neurosurgery;  Laterality: Right;  . VENTRICULOPERITONEAL SHUNT Left 08/26/2018   Procedure: LEFT SIDED VENTRICULAR-PERITONEAL SHUNT;  Surgeon: Julio Sicks, MD;  Location: [facility] OR;  Service: Neurosurgery;  Laterality: Left;  [name] VENTRICULOSTOMY Right 08/09/2018   Procedure: VENTRICULOSTOMY;  Surgeon: Lisbeth Renshaw, MD;  Location: [facility] OR;  Service: Neurosurgery;  Laterality: Right;    There were no vitals filed for this visit.  Subjective Assessment - 09/19/18 1407    Subjective  "It's because the questions are concerning me and um, not really sure why."    Patient is accompained by:  Family member   wife and daughter   Currently in Pain?  No/denies         SLP Evaluation [facility] - 09/19/18 1407      SLP  Visit Information   SLP Received On  09/19/18    Referring Provider (SLP)  Dr. Allena Katz      General Information   HPI  Charles Marquez [facility] a 62 year old right-handed male with history of hyperlipidemia, 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 Pt admitted to [facility] 08/11/18 with malfunction of VP shunt and was discharged home s/p revision. Readmitted 08/08/18-08/22/18 for VP shunt infection, and right  hydrocephalus. Additional shunt revisions 08/26/18 and 09/02/18. D/c from CIR 09/17/18.     Behavioral/Cognition  alert, confused    Mobility Status  ambulated to session      Balance Screen   Has the patient fallen in the past 6 months  --   See PT eval     Prior Functional Status   Cognitive/Linguistic Baseline  Within functional limits    Type of Home  House     Lives With  Spouse    Available Support  Family    Vocation  Full time employment   IT for [employer]     Pain Assessment   Pain Assessment  No/denies pain      Cognition   Overall Cognitive Status  Impaired/Different from baseline   Not formally assessed today; will assess in next 1-2 visits   Attention  Sustained;Selective    Selective Attention  Impaired    Selective Attention Impairment  Verbal basic    Memory  --   wife and daughter report impairments in short term memory   Awareness  Impaired    Awareness Impairment  Intellectual impairment;Emergent impairment;Anticipatory impairment      Auditory Comprehension   Overall Auditory Comprehension  Impaired    Yes/No Questions  Impaired    Basic Biographical Questions  76-100% accurate    Basic Immediate Environment Questions  75-100% accurate    Complex Questions  0-24% accurate    Commands  Impaired    One Step Basic Commands  75-100% accurate    Two Step Basic Commands  50-74% accurate    Multistep Basic Commands  25-49% accurate    Complex Commands  0-24% accurate    Conversation  Simple    Interfering Components  Attention;Motor planning;Working Best boy  Visual/Gestural cues;Repetition;Extra processing time      Reading Comprehension   Reading Status  Impaired   will assess further   Word level  51-75% accurate      Expression   Primary Mode of Expression  Verbal      Verbal Expression   Overall Verbal Expression  Impaired    Initiation  No impairment    Automatic Speech  Name;Social Response;Counting   names months when  days of the week requested   Level of Generative/Spontaneous Verbalization  Sentence;Conversation   simple, 1-3 sentences   Repetition  Impaired    Level of Impairment  Sentence level    Naming  Impairment    Responsive  51-75% accurate   70%   Confrontation  50-74% accurate   8/15   Verbal Errors  Semantic paraphasias;Phonemic paraphasias;Aware of errors;Not aware of errors    Effective Techniques  Sentence completion;Semantic cues;Phonemic cues;Written cues      Written Expression   Dominant Hand  Right    Written Expression  Not tested      Oral Motor/Sensory Function   Overall Oral Motor/Sensory Function  Appears within functional limits for tasks assessed      Motor Speech  Respiration  Within functional limits    Phonation  Normal    Resonance  Within functional limits    Motor Planning  Impaired   mild non-verbal oral apraxia (9/10 commands accurate)     Standardized Assessments   Standardized Assessments   [place] Naming Test-2nd edition    [place] Naming Test-2nd edition   Picture description and simple conversation characterized by fluent speech with circumlocution, wordfinding difficulties, perseverations, semantic/phonemic/verbal paraphasias. Auditory comprehension of words 14/16, Commands 5/10 (difficulty with multi-step, complex), Complex ideational material 0/4, Automatized sequences 4/4 (first word assistance for weekdays), Single word repetition 5/5, Repetition of sentences 1/2, Responsive naming 7/10.                        SLP Short Term Goals - 09/19/18 1405      SLP SHORT TERM GOAL #1   Title  Pt will complete standardized assessment of cognition.    Time  2    Period  Weeks    Status  New      SLP SHORT TERM GOAL #2   Title  Pt will ID object/picture to simple description/feature/function f:4 with occasional min A over 3 sessions     Time  4    Period  Weeks    Status  New      SLP SHORT TERM GOAL #3   Title  Pt will follow 2-step  verbal directions with occasional min A 80% accuracy over 3 sessions     Time  4    Period  Weeks    Status  New      SLP SHORT TERM GOAL #4   Title  Pt will name basic objects/pictures 7/10 correct with occasional mod A over 3 sessions     Time  4    Period  Weeks    Status  New       SLP Long Term Goals - 09/19/18 1405      SLP LONG TERM GOAL #1   Title  Pt will write or name 5 items in a category with occasional min A over 4 sessions     Time  8    Period  Weeks   or 17 visits, for all LTGs   Status  New      SLP LONG TERM GOAL #2   Title  Pt will demo error awareness by attempting correction or by nonverbal response to errors 75% of the time over 3 sessions    Time  8    Period  Weeks    Status  New      SLP LONG TERM GOAL #3   Title  Pt will participate functionally in 10 minutes simple-mod complex conversation with conversational supports for aphasia over 3 sessions.    Time  8    Period  Weeks       Plan - 09/19/18 1604    Clinical Impression Statement  Charles Marquez presents with moderate-severe fluent aphasia and has significant cognitive communication impairments. He has deficits in both verbal expression and auditory comprehension; repetition is impaired at sentence level. He followed single step basic commands today with ~95% accuracy; multistep and complex commands 0-25% accuracy.Speech is fluent, circumlocutory, perseverative, with phonemic, semantic paraphasias, and significant wordfinding difficulties. Pt is intermittently aware of errors. Global aphasia noted in CIR; as speech becomes more fluent presentation is most consistent with Wernicke's type aphasia. Cognition not formally assessed today, however sustained/selective attention are impaired and family  reports short-term memory difficulties. Will assess cognition in next 1-2 sessions (recommend aphasia administration of CLQT to differentiate non-linguistic cognition). I recommend skilled ST to maximize pt's  cognitive and communication abilities in order to improve pt's ability to express wants and needs, follow commands, increase independence and quality of life.    Speech Therapy Frequency  2x / week    Duration  --   8 weeks or 17 visits   Treatment/Interventions  Cognitive reorganization;Multimodal communcation approach;Environmental controls;Compensatory strategies;Language facilitation;Compensatory techniques;Cueing hierarchy;Internal/external aids;Functional tasks;SLP instruction and feedback;Patient/family education    Potential to Achieve Goals  Good    Potential Considerations  Severity of impairments    Consulted and Agree with Plan of Care  Patient;Family member/caregiver       Patient will benefit from skilled therapeutic intervention in order to improve the following deficits and impairments:   Aphasia  Cognitive communication deficit  Apraxia following unspecified cerebrovascular disease    Problem List Patient Active Problem List   Diagnosis Date Noted  . Visual disturbance   . Slow transit constipation   . Hypoalbuminemia due to protein-calorie malnutrition (HCC)   . Acute blood loss anemia   . S/P VP shunt   . Hydrocephalus (HCC) 08/30/2018  . Dyslipidemia   . History of CVA (cerebrovascular accident)   . Benign essential HTN   . Tachycardia   . Leukocytosis   . Hyponatremia   . Hypokalemia   . 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 Bacterial meningitis 08/09/2018  . TIA (transient ischemic attack) 01/29/2017  . Acute encephalopathy   . Shunt malfunction 03/13/2016  . 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 Hyperlipidemia 10/20/2014  . Degenerative disc disease, lumbar 04/15/2013  . Routine general medical examination at a health care facility 07/23/2012  . DISTURBANCE OF SKIN SENSATION 10/05/2008  . HYPERLIPIDEMIA 01/01/2008  . MYCOPLASMA  PNEUMONIA 01/01/2008   Rondel Baton, MS, CCC-SLP Speech-Language Pathologist  Arlana Lindau 09/20/2018, 7:27 PM  [facility] [address] [place], [place], [address] Phone: [phone]   Fax:  [phone]  Name: Charles Marquez MRN: [id number] Date of Birth: 06-14-1956

## 2018-09-24 ENCOUNTER — Ambulatory Visit: Payer: BLUE CROSS/BLUE SHIELD

## 2018-09-24 ENCOUNTER — Ambulatory Visit
Admission: RE | Admit: 2018-09-24 | Discharge: 2018-09-24 | Disposition: A | Discharge status: Home / Self Care | Payer: BLUE CROSS/BLUE SHIELD | Source: Ambulatory Visit | Attending: Neurosurgery | Admitting: Neurosurgery

## 2018-09-24 ENCOUNTER — Ambulatory Visit: Payer: BLUE CROSS/BLUE SHIELD | Admitting: Speech Pathology

## 2018-09-24 ENCOUNTER — Other Ambulatory Visit: Payer: Self-pay | Admitting: Neurosurgery

## 2018-09-24 ENCOUNTER — Encounter: Payer: Self-pay | Admitting: Speech Pathology

## 2018-09-24 ENCOUNTER — Ambulatory Visit: Payer: BLUE CROSS/BLUE SHIELD | Admitting: Occupational Therapy

## 2018-09-24 ENCOUNTER — Encounter: Payer: Self-pay | Admitting: Occupational Therapy

## 2018-09-24 DIAGNOSIS — M6281 Muscle weakness (generalized): Secondary | ICD-10-CM

## 2018-09-24 DIAGNOSIS — R41842 Visuospatial deficit: Secondary | ICD-10-CM

## 2018-09-24 DIAGNOSIS — R4701 Aphasia: Secondary | ICD-10-CM

## 2018-09-24 DIAGNOSIS — G910 Communicating hydrocephalus: Secondary | ICD-10-CM

## 2018-09-24 DIAGNOSIS — R41841 Cognitive communication deficit: Secondary | ICD-10-CM

## 2018-09-24 DIAGNOSIS — I69918 Other symptoms and signs involving cognitive functions following unspecified cerebrovascular disease: Secondary | ICD-10-CM

## 2018-09-24 DIAGNOSIS — R482 Apraxia: Secondary | ICD-10-CM

## 2018-09-24 DIAGNOSIS — R2681 Unsteadiness on feet: Secondary | ICD-10-CM

## 2018-09-24 DIAGNOSIS — R2689 Other abnormalities of gait and mobility: Secondary | ICD-10-CM

## 2018-09-24 NOTE — Patient Instructions (Signed)
Access Code: [email or web address]  URL: [email or web address]  Date: 09/24/2018  Prepared by: Zerita BoersJennifer Masud Holub   Exercises  Side Stepping with Resistance at Thighs and Ankles - 4 reps - 1 sets - 1x daily - 3x weekly  Marching with Resistance - 4 reps - 1 sets - 1x daily - 3x weekly  Backward Walking with Counter Support - 4 reps - 1 sets - 1x daily - 5x weekly  Walking with Head Rotation - 4 reps - 1 sets - 1x daily - 5x weekly  Walking with Head Nod - 4 reps - 1 sets - 1x daily - 5x weekly  Toe Walking - 4 reps - 1 sets - 1x daily - 5x weekly

## 2018-09-24 NOTE — Patient Instructions (Addendum)
  Scattergories Taboo Scrabble Family Feud Outburst jr No timer   Cognitive Activities you can do at home:   - Solitaire  - Majong  - Scrabble  - Chess/Checkers  - Crosswords (easy level)  - Education officer, communityUno  - Card Games  - Board Games  - Connect 4  - Simon  - the Memory Game  - Dominoes  - Scientist, clinical (histocompatibility and immunogenetics)Backgammon  On your computer, tablet or phone: Air traffic controllerBrainHQ Memory Match Game App [employer] [place] IQ [employer]   Use a large calendar, cross off the days - you can put in some appointments and orient him to the day  Then let him with your supervision use the med organizer to get his meds  3 ring binder with section for PT/OT/Speech and therapy appointment calendar

## 2018-09-24 NOTE — Therapy (Signed)
[facility] [address] [place], [place], [address] Phone: [phone]   Fax:  [phone]  Speech Language Pathology Treatment  Patient Details  Name: Charles Marquez MRN: [id number] Date of Birth: 10/26/1956 Referring Provider (SLP): Dr. Allena Katz   Encounter Date: 09/24/2018  End of Session - 09/24/18 1546    Visit Number  2    Number of Visits  17    Date for SLP Re-Evaluation  11/18/18    Authorization Type  BCBS 90 visits for all disciplines     SLP Start Time  0935    SLP Stop Time   1017    SLP Time Calculation (min)  42 min    Activity Tolerance  Other (comment)   limited by anxiety      Past Medical History:  Diagnosis Date  . Anxiety   . Hypercholesteremia   . Stroke (HCC)    tia's  . TIA (transient ischemic attack)    09.15    Past Surgical History:  Procedure Laterality Date  . Fractured arm Left 12  . HERNIA REPAIR Right 3/12  . LAPAROSCOPIC REVISION VENTRICULAR-PERITONEAL (V-P) SHUNT N/A 08/26/2018   Procedure: LAPAROSCOPIC INSERTION VENTRICULAR-PERITONEAL (V-P) SHUNT;  Surgeon: Julio Sicks, MD;  [facility]: [facility] OR;  Service: Neurosurgery;  Laterality: N/A;  . LOOP RECORDER INSERTION N/A 04/10/2017   Procedure: Loop Recorder Insertion;  Surgeon: Hillis Range, MD;  [facility] LAB;  Service: Cardiovascular;  Laterality: N/A;  . SHUNT REMOVAL Right 03/13/2016   Procedure: SHUNT REMOVAL;  Surgeon: Julio Sicks, MD;  Location: [facility] NEURO [facility]S;  Service: Neurosurgery;  Laterality: Right;  . SHUNT REMOVAL Right 08/09/2018   Procedure: SHUNT REMOVAL With Placement of Ventricular Catheter;  Surgeon: Lisbeth Renshaw, MD;  Location: [facility] OR;  Service: Neurosurgery;  Laterality: Right;  . SHUNT REVISION Right 08/05/2018   Procedure: SHUNT REVISION;  Surgeon: Julio Sicks, MD;  Location: [facility] OR;  Service: Neurosurgery;  Laterality: Right;  . SHUNT REVISION VENTRICULAR-PERITONEAL Left 08/26/2018   Procedure: SHUNT REVISION VENTRICULAR-PERITONEAL;  Surgeon: Julio Sicks, MD;  Location: [facility] OR;  Service: Neurosurgery;  Laterality: Left;  . SHUNT REVISION VENTRICULAR-PERITONEAL Left 09/02/2018   Procedure: Left Occipital VP shunt revision;  Surgeon: Julio Sicks, MD;  Location: [facility] OR;  Service: Neurosurgery;  Laterality: Left;  [name] VASECTOMY  10/02/1997  . 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 VENTRICULOPERITONEAL SHUNT Right 07/22/2018   Procedure: Shunt Placment right occipital;  Surgeon: Julio Sicks, MD;  Location: [facility] OR;  Service: Neurosurgery;  Laterality: Right;  . VENTRICULOPERITONEAL SHUNT Left 08/26/2018   Procedure: LEFT SIDED VENTRICULAR-PERITONEAL SHUNT;  Surgeon: Julio Sicks, MD;  Location: [facility] OR;  Service: Neurosurgery;  Laterality: Left;  [name] VENTRICULOSTOMY Right 08/09/2018   Procedure: VENTRICULOSTOMY;  Surgeon: Lisbeth Renshaw, MD;  Location: [facility] OR;  Service: Neurosurgery;  Laterality: Right;    There were no vitals filed for this visit.  Subjective Assessment - 09/24/18 1225    Subjective  "We are doing the memory game and other games at home" wife    Patient is accompained by:  Family member   wife and daughter           ADULT SLP TREATMENT - 09/24/18 1010      General Information   Behavior/Cognition  Alert;Cooperative      Treatment Provided   Treatment provided  Cognitive-Linquistic      Cognitive-Linquistic Treatment   Treatment focused on  Cognition    Skilled Treatment  Initiated Cognitive Linguistic Quick Test (CLQT) - pt exhibited stress/anxiety throughout test. Due to this, I provided brief breaks between subtests. I stopped testing last 10 minutes of session due to pt's anxiety. Educated pt and family re: language/word finding activities that they can  do at home.       Assessment / Recommendations / Plan   Plan  Continue with current plan of care      Progression Toward Goals   Progression toward goals  Progressing toward goals       SLP Education - 09/24/18 1542    Education Details  compensations for memory and orientation; language activitie to do at home    Person(s) Educated  Patient;Spouse;Child(ren)    Methods  Explanation;Verbal cues;Handout    Comprehension  Verbalized understanding;Need further instruction       SLP Short Term Goals - 09/24/18 1545      SLP SHORT TERM GOAL #1   Title  Pt will complete standardized assessment of cognition.    Time  1    Period  Weeks    Status  On-going      SLP SHORT TERM GOAL #2   Title  Pt will ID object/picture to simple description/feature/function f:4 with occasional min A over 3 sessions     Time  4    Period  Weeks    Status  On-going      SLP SHORT TERM GOAL #3   Title  Pt will follow 2-step verbal directions with occasional min A 80% accuracy over 3 sessions     Time  4    Period  Weeks    Status  On-going      SLP SHORT TERM GOAL #4   Title  Pt will name basic objects/pictures 7/10 correct with occasional mod A over 3 sessions     Time  4    Period  Weeks    Status  On-going       SLP Long Term Goals - 09/24/18 1546      SLP LONG TERM GOAL #1   Title  Pt will write or name 5 items in a category with occasional min A over 4 sessions     Time  8    Period  Weeks   or 17 visits, for all LTGs   Status  On-going      SLP LONG TERM GOAL #2   Title  Pt will demo error awareness by attempting correction or by nonverbal response to errors 75% of the time over 3 sessions    Time  8    Period  Weeks    Status  On-going      SLP LONG TERM GOAL #3   Title  Pt will participate functionally in 10 minutes simple-mod complex conversation with conversational supports for aphasia over 3 sessions.    Time  8    Period  Weeks    Status  On-going       Plan -  09/24/18 1543    Clinical Impression Statement  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 CLQT initated today - pt demonstrated significant anxiety and some frustration. 3 subtests remain to be completed next session. Continue skilled ST to maximize communication and cognition for  wants/needs, safety, independence and QOL    Speech Therapy Frequency  2x / week    Duration  --   8 weeks or 17 visits   Treatment/Interventions  Cognitive reorganization;Multimodal communcation approach;Environmental controls;Compensatory strategies;Language facilitation;Compensatory techniques;Cueing hierarchy;Internal/external aids;Functional tasks;SLP instruction and feedback;Patient/family education    Potential to Achieve Goals  Good    Potential Considerations  Severity of impairments    Consulted and Agree with Plan of Care  Patient;Family member/caregiver   daughter, spouse      Patient will benefit from skilled therapeutic intervention in order to improve the following deficits and impairments:   Cognitive communication deficit  Aphasia    Problem List Patient Active Problem List   Diagnosis Date Noted  . Visual disturbance   . Slow transit constipation   . Hypoalbuminemia due to protein-calorie malnutrition (HCC)   . Acute blood loss anemia   . S/P VP shunt   . Hydrocephalus (HCC) 08/30/2018  . Dyslipidemia   . History of CVA (cerebrovascular accident)   . Benign essential HTN   . Tachycardia   . Leukocytosis   . Hyponatremia   . Hypokalemia   . 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 Bacterial meningitis 08/09/2018  . TIA (transient ischemic attack) 01/29/2017  . Acute encephalopathy   . Shunt malfunction 03/13/2016  . 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 Hyperlipidemia 10/20/2014  . Degenerative disc disease,  lumbar 04/15/2013  . Routine general medical examination at a health care facility 07/23/2012  . DISTURBANCE OF SKIN SENSATION 10/05/2008  . HYPERLIPIDEMIA 01/01/2008  . MYCOPLASMA PNEUMONIA 01/01/2008    Avalina Benko, Radene JourneyLaura Ann MS, CCC-SLP 09/24/2018, 3:48 PM  [facility] [address] [place], [place], [address] Phone: [phone]   Fax:  [phone]   Name: Charles SquibbKurt Edward Marquez MRN: [id number] Date of Birth: 1956/03/16

## 2018-09-24 NOTE — Therapy (Signed)
[facility] [address] [place], [place], [address] Phone: [phone]   Fax:  [phone]  Occupational Therapy Treatment  Patient Details  Name: Charles Marquez MRN: [id number] Date of Birth: February 01, 1956 Referring Provider (OT): Dr. Marcello FennelAnkit Anil Patel   Encounter Date: 09/24/2018  OT End of Session - 09/24/18 1131    Visit Number  2    Number of Visits  9    Date for OT Re-Evaluation  10/17/18    Authorization Type  [employer] 90 visits for all disciplines    OT Start Time  0847    OT Stop Time  0930    OT Time Calculation (min)  43 min    Activity Tolerance  Patient tolerated treatment well       Past Medical History:  Diagnosis Date  . Anxiety   . Hypercholesteremia   . Stroke (HCC)    tia's  . TIA (transient ischemic attack)    09.15    Past Surgical History:  Procedure Laterality Date  . Fractured arm Left 12  . HERNIA REPAIR Right 3/12  . LAPAROSCOPIC REVISION VENTRICULAR-PERITONEAL (V-P) SHUNT N/A 08/26/2018   Procedure: LAPAROSCOPIC INSERTION VENTRICULAR-PERITONEAL (V-P) SHUNT;  Surgeon: Julio SicksPool, Henry, MD;  [facility]: [facility] OR;  Service: Neurosurgery;  Laterality: N/A;  . LOOP RECORDER INSERTION N/A 04/10/2017   Procedure: Loop Recorder Insertion;  Surgeon: Hillis RangeAllred, James, MD;  Location: [facility] INVASIVE CV LAB;  Service: Cardiovascular;  Laterality: N/A;  . SHUNT REMOVAL Right 03/13/2016   Procedure: SHUNT REMOVAL;  Surgeon: Julio SicksHenry Pool, MD;  Location: [facility] NEURO ORS;  Service: Neurosurgery;  Laterality: Right;  . SHUNT REMOVAL Right 08/09/2018   Procedure: SHUNT REMOVAL With Placement of Ventricular Catheter;  Surgeon: Lisbeth RenshawNundkumar, Neelesh, MD;  [facility];  Service: Neurosurgery;  Laterality: Right;  . SHUNT REVISION Right 08/05/2018   Procedure: SHUNT REVISION;  Surgeon: Julio SicksPool, Henry, MD;  [facility];  Service: Neurosurgery;  Laterality: Right;  . SHUNT REVISION VENTRICULAR-PERITONEAL Left 08/26/2018   Procedure: SHUNT REVISION VENTRICULAR-PERITONEAL;  Surgeon: Julio SicksPool, Henry, MD;  [facility];  Service: Neurosurgery;  Laterality: Left;  . SHUNT REVISION VENTRICULAR-PERITONEAL Left 09/02/2018   Procedure: Left Occipital VP shunt revision;  Surgeon: Julio SicksPool, Henry, MD;  [facility];  Service: Neurosurgery;  Laterality: Left;  [name]. VASECTOMY  10/02/1997  . VENTRICULOPERITONEAL SHUNT Right 12/18/2014   Procedure: Shunt Placment - right occipital VP shunt ;  Surgeon: Temple PaciniHenry A Pool, MD;  [facility] NEURO [facility]S;  Service: Neurosurgery;  Laterality: Right;  Shunt Placment - right occipital VP shunt   . VENTRICULOPERITONEAL SHUNT Right 07/22/2018   Procedure: Shunt Placment right occipital;  Surgeon: Julio SicksPool, Henry, MD;  [facility];  Service: Neurosurgery;  Laterality: Right;  . VENTRICULOPERITONEAL SHUNT Left 08/26/2018   Procedure: LEFT SIDED VENTRICULAR-PERITONEAL SHUNT;  Surgeon: Julio SicksPool, Henry, MD;  [facility];  Service: Neurosurgery;  Laterality: Left;  [name]. VENTRICULOSTOMY Right 08/09/2018   Procedure: VENTRICULOSTOMY;  Surgeon: Lisbeth RenshawNundkumar, Neelesh, MD;  [facility];  Service: Neurosurgery;  Laterality: Right;    There were no vitals filed for this visit.  Subjective Assessment - 09/24/18 0849    Subjective   Wife reports she thinks pt has a sinus infection.  "It can be a 4 or 5 but right now is a 3"    Patient is accompained by:  Family member   wife and dtr   Pertinent History  S/p multiple VP shunt revisions, shunt infection, hydrocephalus  Patient Stated Goals  well you know - I want this better (pt is aphasic)    Currently in Pain?  Yes    Pain Score  3                    OT Treatments/Exercises (OP) - 09/24/18 0001      ADLs   Overall ADLs  Addressd use of structured schedule to assist pt in improving with simple sequencing and organization with basic self care tasks.  Pt able to participate in direct questioning of "typical" day at home, as well as what his job was  prior to becoming ill and specific home mgmt tasks pt engaged in (pt requires significant time however able to communicate basic information with structure).  Discussed with pt and family importance of structured schedule for repetition to allow pt to improve in his ability to complete bathing and dressing more independently. Will work on schedule before next session and provide to pt and family with opportunity for further input.  All acivities today completed in busy gym environment and pt able to attend with only min cues via questioning.  Pt did report by end of session that his mind was "tired."  Wife reports pt with signs of ADD prior to illness.      Writing  Practiced copying and then writing of first and last name. Pt able to copy. Pt with some perseveration of letters for last name however with cues to detect error. Pt benefits from providing auditory cue (calling out letters) as he writes his name. Coordination and UE functional use WFL"s for writing. Will upgrade wiritng goal based on today's performance.      ADL Comments  Reviewed OT POC and goals with pt, wife and dtr.  All in agreement. Written copy of goals provided to pt and family.               OT Education - 09/24/18 1128    Education Details  benefit of structured schedule at home, OT POC and goals.     Person(s) Educated  Patient;Spouse;Child(ren)    Methods  Explanation;Demonstration;Handout    Comprehension  Verbalized understanding         OT Long Term Goals - 09/24/18 1128      OT LONG TERM GOAL #1   Title  Pt and family will be mod I with home activities program to address self care and cognition via functional tasks and structured schedule - 10/17/2108    Status  On-going      OT LONG TERM GOAL #2   Title  Pt will be mod I with basic ADL tasks     Status  On-going      OT LONG TERM GOAL #3   Title  Pt will demonstrate sufficient attention to attend to familiar functional task in moderately busy  environment.    Status  On-going      OT LONG TERM GOAL #4   Title  Pt will be mod I with sequencing for basic self care tasks    Status  On-going      OT LONG TERM GOAL #5   Title  Pt will be supervision for simple, familiar home mgmt tasks    Status  On-going      OT LONG TERM GOAL #6   Title  Pt will be able to copy simple sentence with at least 90% accuracy    Status  Revised   09/24/2018 pt able to  copy first and last name today - will revise goal           Plan - 09/24/18 1130    Clinical Impression Statement  Pt is progressing toward goals. Pt with improving attention and communication within a functional task.     Occupational Profile and client history currently impacting functional performance  PMH: multiple shunt revisions, anxiety, TIA, LUE fx, communicating hydrocelphalus.     Occupational performance deficits (Please refer to evaluation for details):  ADL's;IADL's;Rest and Sleep;Work;Leisure;Social Participation    OT Frequency  2x / week    OT Duration  4 weeks    OT Treatment/Interventions  Self-care/ADL training;Neuromuscular education;Building services engineer;Therapeutic activities;Cognitive remediation/compensation;Visual/perceptual remediation/compensation;Patient/family education;Balance training    Consulted and Agree with Plan of Care  Patient;Family member/caregiver    Family Member Consulted  wife and dtr       Patient will benefit from skilled therapeutic intervention in order to improve the following deficits and impairments:  Decreased balance, Decreased cognition, Decreased safety awareness, Decreased mobility, Impaired vision/preception  Visit Diagnosis: Unsteadiness on feet  Muscle weakness (generalized)  Other symptoms and signs involving cognitive functions following unspecified cerebrovascular disease  Visuospatial deficit  Apraxia    Problem List Patient Active Problem List   Diagnosis Date Noted  . Visual disturbance   .  Slow transit constipation   . Hypoalbuminemia due to protein-calorie malnutrition (HCC)   . Acute blood loss anemia   . S/P VP shunt   . Hydrocephalus (HCC) 08/30/2018  . Dyslipidemia   . History of CVA (cerebrovascular accident)   . Benign essential HTN   . Tachycardia   . Leukocytosis   . Hyponatremia   . Hypokalemia   . 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 Bacterial meningitis 08/09/2018  . TIA (transient ischemic attack) 01/29/2017  . Acute encephalopathy   . Shunt malfunction 03/13/2016  . 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 Hyperlipidemia 10/20/2014  . Degenerative disc disease, lumbar 04/15/2013  . Routine general medical examination at a health care facility 07/23/2012  . DISTURBANCE OF SKIN SENSATION 10/05/2008  . HYPERLIPIDEMIA 01/01/2008  . MYCOPLASMA PNEUMONIA 01/01/2008    Norton Pastel, OTR/L 09/24/2018, 11:32 AM   [facility] [address] [place], [place], [address] Phone: [phone]   Fax:  [phone]  Name: Charles Marquez MRN: [id number] Date of Birth: Jan 20, 1956

## 2018-09-24 NOTE — Therapy (Signed)
[facility] [address] [place], [place], [address] Phone: [phone]   Fax:  [phone]  Physical Therapy Treatment  Patient Details  Name: Charles Marquez MRN: [id number] Date of Birth: 02/21/1956 Referring Provider (PT): Dr. Allena KatzPatel    Encounter Date: 09/24/2018  PT End of Session - 09/24/18 1100    Visit Number  2    Number of Visits  17    Date for PT Re-Evaluation  11/18/18    Authorization Type  BCBS OTHER     PT Start Time  1017    PT Stop Time  1059    PT Time Calculation (min)  42 min    Equipment Utilized During Treatment  --   min guard to S prn   Activity Tolerance  Patient tolerated treatment well    Behavior During Therapy  [facility] for tasks assessed/performed       Past Medical History:  Diagnosis Date  . Anxiety   . Hypercholesteremia   . Stroke (HCC)    tia's  . TIA (transient ischemic attack)    09.15    Past Surgical History:  Procedure Laterality Date  . Fractured arm Left 12  . HERNIA REPAIR Right 3/12  . LAPAROSCOPIC REVISION VENTRICULAR-PERITONEAL (V-P) SHUNT N/A 08/26/2018   Procedure: LAPAROSCOPIC INSERTION VENTRICULAR-PERITONEAL (V-P) SHUNT;  Surgeon: Julio SicksPool, Henry, MD;  [facility];  Service: Neurosurgery;  Laterality: N/A;  . LOOP RECORDER INSERTION N/A 04/10/2017   Procedure: Loop Recorder Insertion;  Surgeon: Hillis RangeAllred, James, MD;  [facility]: [facility] INV[facility] CV LAB;  Service: Cardiovascular;  Laterality: N/A;  . SHUNT REMOVAL Right 03/13/2016   Procedure: SHUNT REMOVAL;  Surgeon: Julio SicksHenry Pool, MD;  Location: [facility] NEURO ORS;  Service: Neurosurgery;  Laterality: Right;  . SHUNT REMOVAL Right 08/09/2018   Procedure: SHUNT REMOVAL With Placement of Ventricular Catheter;  Surgeon: Lisbeth RenshawNundkumar, Neelesh, MD;  [facility];  Service: Neurosurgery;  Laterality: Right;  . SHUNT REVISION Right 08/05/2018   Procedure: SHUNT REVISION;  Surgeon: Julio SicksPool, Henry, MD;  [facility];  Service:  Neurosurgery;  Laterality: Right;  . SHUNT REVISION VENTRICULAR-PERITONEAL Left 08/26/2018   Procedure: SHUNT REVISION VENTRICULAR-PERITONEAL;  Surgeon: Julio SicksPool, Henry, MD;  [facility];  Service: Neurosurgery;  Laterality: Left;  . SHUNT REVISION VENTRICULAR-PERITONEAL Left 09/02/2018   Procedure: Left Occipital VP shunt revision;  Surgeon: Julio SicksPool, Henry, MD;  [facility];  Service: Neurosurgery;  Laterality: Left;  [name]. VASECTOMY  10/02/1997  . VENTRICULOPERITONEAL SHUNT Right 12/18/2014   Procedure: Shunt Placment - right occipital VP shunt ;  Surgeon: Temple PaciniHenry A Pool, MD;  [facility] NEURO ORS;  Service: Neurosurgery;  Laterality: Right;  Shunt Placment - right occipital VP shunt   . VENTRICULOPERITONEAL SHUNT Right 07/22/2018   Procedure: Shunt Placment right occipital;  Surgeon: Julio SicksPool, Henry, MD;  [facility];  Service: Neurosurgery;  Laterality: Right;  . VENTRICULOPERITONEAL SHUNT Left 08/26/2018   Procedure: LEFT SIDED VENTRICULAR-PERITONEAL SHUNT;  Surgeon: Julio SicksPool, Henry, MD;  [facility];  Service: Neurosurgery;  Laterality: Left;  [name]. VENTRICULOSTOMY Right 08/09/2018   Procedure: VENTRICULOSTOMY;  Surgeon: Lisbeth RenshawNundkumar, Neelesh, MD;  [facility];  Service: Neurosurgery;  Laterality: Right;    There were no vitals filed for this visit.  Subjective Assessment - 09/24/18 1022    Subjective  [name] reported pt likely has a sinus infection and will see MD after this appt. [name] requesting for pt not to do anything where head is lowered due to congestion.  Pt denied falls since last visit.   (Pended)     Pertinent History   S/p VP shunt, Hydrocephalus, CVA with loop recorder insertion 04/10/2017 as well as R occipital VP shunt placement 2016, Infection of ventricular shunt, shunt malfunction, HLD, anxiety, TIA, Hypercholesteremia, acute blood loss anemia, HTN, Tachycardia, Leukocytosis, Hyponatremia, Bacterial Encephalitis, Bacterial Meningitis, Dyslipidemia, DDD lumbar spine, and  Mycoplasma Pneumonia.   (Pended)     Patient Stated Goals  "Id love to be able to run backwards again to [employer] basketball games and improve my memory."  (Pended)     Currently in Pain?  Yes  (Pended)     Pain Score  3   (Pended)     Pain Location  Head  (Pended)     Pain Orientation  --  (Pended)    forehead   Pain Descriptors / Indicators  Headache  (Pended)     Pain Type  Acute pain  (Pended)     Pain Onset  In the past 7 days  (Pended)     Pain Frequency  Constant  (Pended)     Aggravating Factors   sinus infection  (Pended)            Therex and Neuro re-ed: Access Code: [email or web address]  URL: [email or web address]  Date: 09/24/2018  Prepared by: Zerita Boers   Exercises  Side Stepping with Resistance at Thighs and Ankles - 4 reps - 1 sets - 1x daily - 3x weekly  (therex) Marching with Resistance - 4 reps - 1 sets - 1x daily - 3x weekly (therex) Backward Walking with Counter Support - 4 reps - 1 sets - 1x daily - 5x weekly  Walking with Head Rotation - 4 reps - 1 sets - 1x daily - 5x weekly  Walking with Head Nod - 4 reps - 1 sets - 1x daily - 5x weekly  Toe Walking - 4 reps - 1 sets - 1x daily - 5x weekly  Heel walking- 4 reps-1 sets (not added to HEP)  Performed at counter 4-8 laps to ensure proper technique, PT also had pt's wife practice providing instructions for pt to perform HEP at home 2/2 pt's hx of cognitive impairments. Cues to decr. Momentum, improve step length and improve eccentric control.                     PT Education - 09/24/18 1059    Education Details  Pt provided pt with balance and strengthening HEP. PT also encouraged pt to see MD regarding potential sinus infection.     Person(s) Educated  Patient;Spouse    Methods  Explanation;Demonstration;Tactile cues;Verbal cues;Handout    Comprehension  Returned demonstration;Verbalized understanding;Need further instruction       PT Short Term Goals - 09/19/18 1525       PT SHORT TERM GOAL #1   Title  Pt will participate in establishment of initial HEP to improve strength, balance, and functional mobility.     Time  4    Period  Weeks    Status  New    Target Date  10/19/18      PT SHORT TERM GOAL #2   Title  Pt will ambulate 500 feet outdoors navigating uneven terrain, curbs, ramps, and perform dual cognitive tasks with therapist providing supervision to improve safety with community distances.     Time  4    Period  Weeks    Status  New    Target Date  10/19/18  PT SHORT TERM GOAL #3   Title  Pt will perform 12 steps with reciprocal stepping technique and single HR demonstrating improvement in safety with community accessibility with therapist providing supervision.     Time  4    Period  Weeks    Status  New    Target Date  10/19/18      PT SHORT TERM GOAL #4   Title  Pt will report no LOB episodes when walking over his gravel drive way indicating improvement in safety with functional mobility at home.    Time  4    Period  Weeks    Status  New    Target Date  10/19/18      PT SHORT TERM GOAL #5   Title  Pt will improve FGA score to >/= 20/30 indicating improvement in functional mobility.     Baseline  11/7: 16/30 indicating high fall risk potential     Time  4    Period  Weeks    Status  New    Target Date  10/19/18        PT Long Term Goals - 09/19/18 1530      PT LONG TERM GOAL #1   Title  Pt will be independent and compliant with performing is HEP to improve balance, strength, and functional mobility.    Time  8    Period  Weeks    Status  New    Target Date  11/18/18      PT LONG TERM GOAL #2   Title  Pt will ambulate 1000 feet outdoors at mod I level navigating uneven terrain, curbs, ramps, and perform dual cognitive tasks to increase safety with community distances.     Time  8    Period  Weeks    Status  New    Target Date  11/18/18      PT LONG TERM GOAL #3   Title  Pt will demonstrate 12 steps with no HR's at  mod I level with good safety awareness to improve community accessibility.     Time  8    Period  Weeks    Status  New    Target Date  11/18/18      PT LONG TERM GOAL #4   Title  Pt will improve FGA score to >/=25/30 indicating improvement in functional mobility.     Time  8    Period  Weeks    Status  New    Target Date  11/18/18            Plan - 09/24/18 1100    Clinical Impression Statement  Today's skilled session focused on establishing strengthening and high level balance HEP. Pt required frequent verbal and tactile cues for proper technique and to use UE support at counter for safety. Pt required seated rest break at end of session 2/2 fatigue. PT avoided exercises with pt's head lowered 2/2 reported incr. sinus pressure/congesiton. Continue with POC.     Rehab Potential  Good    Clinical Impairments Affecting Rehab Potential  Pt has short term memory deficits and cognitive changes from baseline which may negatively influence his ability to recall exercises and progressions in therapy.     PT Frequency  2x / week    PT Duration  8 weeks    PT Treatment/Interventions  ADLs/Self Care Home Management;Therapeutic activities;Dry needling;Therapeutic exercise;Balance training;Neuromuscular re-education;Manual techniques;Visual/perceptual remediation/compensation;Patient/family education;Stair training;Gait training;Electrical Stimulation;Functional mobility training;Passive range of motion;DME Instruction  PT Next Visit Plan  Assess corner balance and add to HEP prn.  high level balance and strengthening in quadriped/ tall kneeling    PT Home Exercise Plan  tbd    Consulted and Agree with Plan of Care  Patient;Family member/caregiver    Family Member Consulted  wife- kim & daughter- michelle       Patient will benefit from skilled therapeutic intervention in order to improve the following deficits and impairments:  Abnormal gait, Decreased coordination, Decreased safety  awareness, Impaired vision/preception, Decreased balance, Decreased cognition, Decreased mobility, Decreased strength  Visit Diagnosis: Unsteadiness on feet  Muscle weakness (generalized)  Other abnormalities of gait and mobility     Problem List Patient Active Problem List   Diagnosis Date Noted  . Visual disturbance   . Slow transit constipation   . Hypoalbuminemia due to protein-calorie malnutrition (HCC)   . Acute blood loss anemia   . S/P VP shunt   . Hydrocephalus (HCC) 08/30/2018  . Dyslipidemia   . History of CVA (cerebrovascular accident)   . Benign essential HTN   . Tachycardia   . Leukocytosis   . Hyponatremia   . Hypokalemia   . 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 Bacterial meningitis 08/09/2018  . TIA (transient ischemic attack) 01/29/2017  . Acute encephalopathy   . Shunt malfunction 03/13/2016  . 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 Hyperlipidemia 10/20/2014  . Degenerative disc disease, lumbar 04/15/2013  . Routine general medical examination at a health care facility 07/23/2012  . DISTURBANCE OF SKIN SENSATION 10/05/2008  . HYPERLIPIDEMIA 01/01/2008  . MYCOPLASMA PNEUMONIA 01/01/2008    Jasiri Hanawalt L 09/24/2018, 11:03 AM  [facility] [address] [place], [place], [address] Phone: [phone]   Fax:  [phone]  Name: Charles Marquez MRN: [id number] Date of Birth: [birth year]  Zerita Boers, PT,DPT 09/24/18 11:05 AM Phone: [phone] Fax: [phone]

## 2018-09-26 ENCOUNTER — Ambulatory Visit: Payer: BLUE CROSS/BLUE SHIELD | Admitting: Physical Therapy

## 2018-09-26 ENCOUNTER — Ambulatory Visit: Payer: BLUE CROSS/BLUE SHIELD | Admitting: Occupational Therapy

## 2018-09-26 ENCOUNTER — Encounter: Payer: Self-pay | Admitting: Physical Therapy

## 2018-09-26 ENCOUNTER — Encounter: Payer: Self-pay | Admitting: Occupational Therapy

## 2018-09-26 ENCOUNTER — Ambulatory Visit: Payer: BLUE CROSS/BLUE SHIELD | Admitting: Speech Pathology

## 2018-09-26 VITALS — BP 133/93

## 2018-09-26 VITALS — BP 140/100 | HR 110

## 2018-09-26 DIAGNOSIS — I69990 Apraxia following unspecified cerebrovascular disease: Secondary | ICD-10-CM

## 2018-09-26 DIAGNOSIS — M6281 Muscle weakness (generalized): Secondary | ICD-10-CM

## 2018-09-26 DIAGNOSIS — R4701 Aphasia: Secondary | ICD-10-CM

## 2018-09-26 DIAGNOSIS — R2681 Unsteadiness on feet: Secondary | ICD-10-CM

## 2018-09-26 DIAGNOSIS — R41841 Cognitive communication deficit: Secondary | ICD-10-CM

## 2018-09-26 DIAGNOSIS — R41842 Visuospatial deficit: Secondary | ICD-10-CM

## 2018-09-26 DIAGNOSIS — R2689 Other abnormalities of gait and mobility: Secondary | ICD-10-CM

## 2018-09-26 DIAGNOSIS — R482 Apraxia: Secondary | ICD-10-CM

## 2018-09-26 DIAGNOSIS — I69918 Other symptoms and signs involving cognitive functions following unspecified cerebrovascular disease: Secondary | ICD-10-CM

## 2018-09-26 NOTE — Therapy (Signed)
[facility] [address] [place], [place], [address] Phone: [phone]   Fax:  [phone]  Physical Therapy Treatment  Patient Details  Name: Charles Marquez MRN: [id number] Date of Birth: 06-02-1956 Referring Provider (PT): Dr. Allena Katz    Encounter Date: 09/26/2018  PT End of Session - 09/26/18 1537    Visit Number  3    Number of Visits  17    Date for PT Re-Evaluation  11/18/18    Authorization Type  [employer] OTHER     PT Start Time  1445    PT Stop Time  1535    PT Time Calculation (min)  50 min    Activity Tolerance  Patient tolerated treatment well    Behavior During Therapy  [facility] for tasks assessed/performed       Past Medical History:  Diagnosis Date  . Anxiety   . Hypercholesteremia   . Stroke (HCC)    tia's  . TIA (transient ischemic attack)    09.15    Past Surgical History:  Procedure Laterality Date  . Fractured arm Left 12  . HERNIA REPAIR Right 3/12  . LAPAROSCOPIC REVISION VENTRICULAR-PERITONEAL (V-P) SHUNT N/A 08/26/2018   Procedure: LAPAROSCOPIC INSERTION VENTRICULAR-PERITONEAL (V-P) SHUNT;  Surgeon: Julio Sicks, MD;  Location: [facility] OR;  Service: Neurosurgery;  Laterality: N/A;  . LOOP RECORDER INSERTION N/A 04/10/2017   Procedure: Loop Recorder Insertion;  Surgeon: Hillis Range, MD;  Location: [facility] INVASIVE CV LAB;  Service: Cardiovascular;  Laterality: N/A;  . SHUNT REMOVAL Right 03/13/2016   Procedure: SHUNT REMOVAL;  Surgeon: Julio Sicks, MD;  Location: [facility] NEURO ORS;  Service: Neurosurgery;  Laterality: Right;  . SHUNT REMOVAL Right 08/09/2018   Procedure: SHUNT REMOVAL With Placement of Ventricular Catheter;  Surgeon: Lisbeth Renshaw, MD;  Location: [facility] OR;  Service: Neurosurgery;  Laterality: Right;  . SHUNT REVISION Right 08/05/2018   Procedure: SHUNT REVISION;  Surgeon: Julio Sicks, MD;  Location: [facility] OR;  Service: Neurosurgery;  Laterality: Right;  . SHUNT REVISION VENTRICULAR-PERITONEAL  Left 08/26/2018   Procedure: SHUNT REVISION VENTRICULAR-PERITONEAL;  Surgeon: Julio Sicks, MD;  Location: [facility] OR;  Service: Neurosurgery;  Laterality: Left;  . SHUNT REVISION VENTRICULAR-PERITONEAL Left 09/02/2018   Procedure: Left Occipital VP shunt revision;  Surgeon: Julio Sicks, MD;  Location: [facility] OR;  Service: Neurosurgery;  Laterality: Left;  [name] VASECTOMY  10/02/1997  . 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 VENTRICULOPERITONEAL SHUNT Right 07/22/2018   Procedure: Shunt Placment right occipital;  Surgeon: Julio Sicks, MD;  Location: [facility] OR;  Service: Neurosurgery;  Laterality: Right;  . VENTRICULOPERITONEAL SHUNT Left 08/26/2018   Procedure: LEFT SIDED VENTRICULAR-PERITONEAL SHUNT;  Surgeon: Julio Sicks, MD;  Location: [facility] OR;  Service: Neurosurgery;  Laterality: Left;  [name] VENTRICULOSTOMY Right 08/09/2018   Procedure: VENTRICULOSTOMY;  Surgeon: Lisbeth Renshaw, MD;  Location: [facility] OR;  Service: Neurosurgery;  Laterality: Right;    Vitals:   09/26/18 1525 09/26/18 1526 09/26/18 1528 09/26/18 1529  BP: 134/88 (!) 137/93 (!) 131/101 (!) 140/100  Pulse: (!) 112 (!) 116 (!) 111 (!) 110    Subjective Assessment - 09/26/18 1450    Subjective  Wife reports Charles Marquez does not have a sinus infection and it may be 2/2 to his shunt from increased drainage or from high  blood pressure. Wife reports next Wednesday he is going for an MRI. Pt reports he has had a minor headache all day.     Patient is accompained by:  Family member   Wife Charles Marquez and daughter Charles Marquez   Pertinent History   S/p VP shunt, Hydrocephalus, CVA with loop recorder insertion 04/10/2017 as well as R occipital VP shunt placement 2016, Infection of ventricular shunt, shunt malfunction, HLD, anxiety, TIA, Hypercholesteremia, acute blood loss anemia,  HTN, Tachycardia, Leukocytosis, Hyponatremia, Bacterial Encephalitis, Bacterial Meningitis, Dyslipidemia, DDD lumbar spine, and Mycoplasma Pneumonia.     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  Yes    Pain Location  Other (Comment)   Stomach   Pain Orientation  Left    Pain Descriptors / Indicators  Sharp        Skilled Physical Therapy Intervention:  Patient verbalizes and demonstrates understanding of the below corner balance HEP exercises to perform at home with his wife providing supervision to improve safety with functional mobility.     Exercises  Romberg Stance with Eyes Closed - 3 sets - 30 hold - 1x daily - 7x weekly  Romberg Stance with Head Rotation - 3 sets - 30 hold - 1x daily - 7x weekly  Romberg Stance with Head Nods - 3 sets - 30 hold - 1x daily - 7x weekly  Tandem Stance with Support - 3 sets - 30 hold - 1x daily - 7x weekly  Tandem Stance with Head Rotation - 3 sets - 30 hold - 1x daily - 7x weekly  Romberg Stance on Foam Pad - 3 sets - 30 hold - 1x daily - 7x weekly  Romberg Stance Eyes Closed on Foam Pad - 3 sets - 30 hold - 1x daily - 7x weekly     [facility] Adult PT Treatment/Exercise - 09/26/18 1534      Balance   Balance Assessed  Yes      Dynamic Sitting Balance   Sitting balance - Comments  Pt performs dynamic core stabilization exercise on physioball. Pt performs 2x10 reps of alternating hip flexion with therapist cueing for proper posture and improved motor control. Pt progresses to alternating marching with contralateral UE performing shoulder flexion to increase challenge with core stabilization.              PT Education - 09/26/18 1536    Education Details  Therapist provided education regarding findings from BP assessments and updated HEP with corner balance exercises to improve safety with  functional mobility.     Person(s) Educated  Patient    Methods  Explanation    Comprehension  Verbalized understanding       PT Short Term Goals - 09/19/18 1525      PT SHORT TERM GOAL #1   Title  Pt will participate in establishment of initial HEP to improve strength, balance, and functional mobility.     Time  4    Period  Weeks    Status  New    Target Date  10/19/18      PT SHORT TERM GOAL #2   Title  Pt will ambulate 500 feet outdoors navigating uneven terrain, curbs, ramps, and perform dual cognitive tasks with therapist providing supervision to improve safety with community distances.     Time  4    Period  Weeks    Status  New    Target Date  10/19/18      PT SHORT TERM GOAL #3   Title  Pt will perform 12 steps with reciprocal stepping technique and single HR demonstrating improvement in safety with community accessibility with therapist providing supervision.     Time  4    Period  Weeks    Status  New    Target Date  10/19/18      PT SHORT TERM GOAL #4   Title  Pt will report no LOB episodes when walking over his gravel drive way indicating improvement in safety with functional mobility at home.    Time  4    Period  Weeks    Status  New    Target Date  10/19/18      PT SHORT TERM GOAL #5   Title  Pt will improve FGA score to >/= 20/30 indicating improvement in functional mobility.     Baseline  11/7: 16/30 indicating high fall risk potential     Time  4    Period  Weeks    Status  New    Target Date  10/19/18        PT Long Term Goals - 09/19/18 1530      PT LONG TERM GOAL #1   Title  Pt will be independent and compliant with performing is HEP to improve balance, strength, and functional mobility.    Time  8    Period  Weeks    Status  New    Target Date  11/18/18      PT LONG TERM GOAL #2   Title  Pt will ambulate 1000 feet outdoors at mod I level navigating uneven terrain, curbs, ramps, and perform dual cognitive tasks to increase safety with  community distances.     Time  8    Period  Weeks    Status  New    Target Date  11/18/18      PT LONG TERM GOAL #3   Title  Pt will demonstrate 12 steps with no HR's at mod I level with good safety awareness to improve community accessibility.     Time  8    Period  Weeks    Status  New    Target Date  11/18/18      PT LONG TERM GOAL #4   Title  Pt will improve FGA score to >/=25/30 indicating improvement in functional mobility.     Time  8    Period  Weeks    Status  New    Target Date  11/18/18            Plan - 09/26/18 1544    Clinical Impression Statement  Skilled session focused on implementing corner balance HEP and performing core stabilization exercises on physioball with dynamic extremity movements. Pt tolerated exercises well and demonstrated ability to perform corner balance exercises with proper technique. Pt demonstrates increased challenge when balancing on compliant surface with narrow BOS. Refer to vitals section for increasing BP & HR readings with patient indicating dizziness, visual changes, and mild headache throughout session duration. Occupational therapy was notified of patient's vital readings prior to start of his next session s/p conclusion of PT session. Pt will continue to benefit from skilled PT to address balance, strength, and functional mobility deficits .    Rehab Potential  Good    Clinical Impairments Affecting Rehab Potential  Pt has short term  memory deficits and cognitive changes from baseline which may negatively influence his ability to recall exercises and progressions in therapy.     PT Frequency  2x / week    PT Duration  8 weeks    PT Treatment/Interventions  ADLs/Self Care Home Management;Therapeutic activities;Dry needling;Therapeutic exercise;Balance training;Neuromuscular re-education;Manual techniques;Visual/perceptual remediation/compensation;Patient/family education;Stair training;Gait training;Electrical Stimulation;Functional  mobility training;Passive range of motion;DME Instruction    PT Next Visit Plan  ASSESS BP throughout session duration 2/2 to high readings during previous session, assess how patient is progressing at home with HEP, high level balance and strengthening in quadruped/ tall kneeling.  Avoid activities where he is bending forwards/head down    PT Home Exercise Plan  [email or web address]     Consulted and Agree with Plan of Care  Patient;Family member/caregiver    Family Member Consulted  wife- kim        Patient will benefit from skilled therapeutic intervention in order to improve the following deficits and impairments:  Abnormal gait, Decreased coordination, Decreased safety awareness, Impaired vision/preception, Decreased balance, Decreased cognition, Decreased mobility, Decreased strength  Visit Diagnosis: Other abnormalities of gait and mobility  Unsteadiness on feet     Problem List Patient Active Problem List   Diagnosis Date Noted  . Visual disturbance   . Slow transit constipation   . Hypoalbuminemia due to protein-calorie malnutrition (HCC)   . Acute blood loss anemia   . S/P VP shunt   . Hydrocephalus (HCC) 08/30/2018  . Dyslipidemia   . History of CVA (cerebrovascular accident)   . Benign essential HTN   . Tachycardia   . Leukocytosis   . Hyponatremia   . Hypokalemia   . 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 Bacterial meningitis 08/09/2018  . TIA (transient ischemic attack) 01/29/2017  . Acute encephalopathy   . Shunt malfunction 03/13/2016  . 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 Hyperlipidemia 10/20/2014  . Degenerative disc disease, lumbar 04/15/2013  . Routine general medical examination at a health care facility 07/23/2012  . DISTURBANCE OF SKIN SENSATION 10/05/2008  . HYPERLIPIDEMIA 01/01/2008  . MYCOPLASMA PNEUMONIA 01/01/2008    Carney Living,  SPT 09/26/2018, 3:49 PM  [facility] [address] [place], [place], [address] Phone: [phone]   Fax:  [phone]  Name: Charles Marquez MRN: [id number] Date of Birth: October 01, 1956

## 2018-09-26 NOTE — Patient Instructions (Addendum)
Access Code: [email or web address]  URL: [email or web address]  Date: 09/26/2018  Prepared by: Carney Livingara Saree Krogh   Exercises  Side Stepping with Resistance at Thighs and Ankles - 4 reps - 1 sets - 1x daily - 3x weekly  Marching with Resistance - 4 reps - 1 sets - 1x daily - 3x weekly  Backward Walking with Counter Support - 4 reps - 1 sets - 1x daily - 5x weekly  Walking with Head Rotation - 4 reps - 1 sets - 1x daily - 5x weekly  Walking with Head Nod - 4 reps - 1 sets - 1x daily - 5x weekly  Toe Walking - 4 reps - 1 sets - 1x daily - 5x weekly  Romberg Stance with Eyes Closed - 3 sets - 30 hold - 1x daily - 7x weekly  Romberg Stance with Head Rotation - 3 sets - 30 hold - 1x daily - 7x weekly  Romberg Stance with Head Nods - 3 sets - 30 hold - 1x daily - 7x weekly  Tandem Stance with Support - 3 sets - 30 hold - 1x daily - 7x weekly  Tandem Stance with Head Rotation - 3 sets - 30 hold - 1x daily - 7x weekly  Romberg Stance on Foam Pad - 3 sets - 30 hold - 1x daily - 7x weekly  Romberg Stance Eyes Closed on Foam Pad - 3 sets - 30 hold - 1x daily - 7x weekly         Blood Pressure Record Sheet Your blood pressure on this visit to the emergency department or clinic is elevated. This does not necessarily mean you have high blood pressure (hypertension), but it does mean that your blood pressure needs to be rechecked. Many times your blood pressure can increase due to illness, pain, anxiety, or other factors. We recommend that you get a series of blood pressure readings done over a period of 5 days. It is best to get a reading in the morning and one in the evening. You should make sure to sit and relax for 1-5 minutes before the reading is taken. Write the readings down and make a follow-up appointment with your health care provider to discuss the results. If there is not a free clinic or a drug store with a blood-pressure-taking machine near you, you can purchase blood-pressure-taking equipment  from a drug store. Having one in the home allows you the convenience of taking your blood pressure while you are home and relaxed. Blood Pressure Log Date: _______________________  a.m. _____________________  p.m. _____________________  Date: _______________________  a.m. _____________________  p.m. _____________________  Date: _______________________  a.m. _____________________  p.m. _____________________  Date: _______________________  a.m. _____________________  p.m. _____________________  Date: _______________________  a.m. _____________________  p.m. _____________________  This information is not intended to replace advice given to you by your health care provider. Make sure you discuss any questions you have with your health care provider. Document Released: 07/29/2003 Document Revised: 10/13/2016 Document Reviewed: 12/23/2013 Elsevier Interactive Patient Education  2018 [employer]

## 2018-09-26 NOTE — Therapy (Signed)
[facility] [address] [place], [place], [address] Phone: [phone]   Fax:  [phone]  Speech Language Pathology Treatment  Patient Details  Name: Charles Marquez MRN: [id number] Date of Birth: January 12, 1956 Referring Provider (SLP): Dr. Allena Katz   Encounter Date: 09/26/2018  End of Session - 09/26/18 1307    Visit Number  3    Number of Visits  17    Date for SLP Re-Evaluation  11/18/18    Authorization Type  BCBS 90 visits for all disciplines     SLP Start Time  0800    SLP Stop Time   0845    SLP Time Calculation (min)  45 min    Activity Tolerance  Patient tolerated treatment well       Past Medical History:  Diagnosis Date  . Anxiety   . Hypercholesteremia   . Stroke (HCC)    tia's  . TIA (transient ischemic attack)    09.15    Past Surgical History:  Procedure Laterality Date  . Fractured arm Left 12  . HERNIA REPAIR Right 3/12  . LAPAROSCOPIC REVISION VENTRICULAR-PERITONEAL (V-P) SHUNT N/A 08/26/2018   Procedure: LAPAROSCOPIC INSERTION VENTRICULAR-PERITONEAL (V-P) SHUNT;  Surgeon: Julio Sicks, MD;  Location: [facility] OR;  Service: Neurosurgery;  Laterality: N/A;  . LOOP RECORDER INSERTION N/A 04/10/2017   Procedure: Loop Recorder Insertion;  Surgeon: Hillis Range, MD;  Location: [facility] INVASIVE CV LAB;  Service: Cardiovascular;  Laterality: N/A;  . SHUNT REMOVAL Right 03/13/2016   Procedure: SHUNT REMOVAL;  Surgeon: Julio Sicks, MD;  Location: [facility] NEURO ORS;  Service: Neurosurgery;  Laterality: Right;  . SHUNT REMOVAL Right 08/09/2018   Procedure: SHUNT REMOVAL With Placement of Ventricular Catheter;  Surgeon: Lisbeth Renshaw, MD;  Location: [facility] OR;  Service: Neurosurgery;  Laterality: Right;  . SHUNT REVISION Right 08/05/2018   Procedure: SHUNT REVISION;  Surgeon: Julio Sicks, MD;  Location: [facility] OR;  Service: Neurosurgery;  Laterality: Right;  . SHUNT REVISION VENTRICULAR-PERITONEAL Left 08/26/2018    Procedure: SHUNT REVISION VENTRICULAR-PERITONEAL;  Surgeon: Julio Sicks, MD;  Location: [facility] OR;  Service: Neurosurgery;  Laterality: Left;  . SHUNT REVISION VENTRICULAR-PERITONEAL Left 09/02/2018   Procedure: Left Occipital VP shunt revision;  Surgeon: Julio Sicks, MD;  Location: [facility] OR;  Service: Neurosurgery;  Laterality: Left;  [name] VASECTOMY  10/02/1997  . 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 VENTRICULOPERITONEAL SHUNT Right 07/22/2018   Procedure: Shunt Placment right occipital;  Surgeon: Julio Sicks, MD;  Location: [facility] OR;  Service: Neurosurgery;  Laterality: Right;  . VENTRICULOPERITONEAL SHUNT Left 08/26/2018   Procedure: LEFT SIDED VENTRICULAR-PERITONEAL SHUNT;  Surgeon: Julio Sicks, MD;  Location: [facility] OR;  Service: Neurosurgery;  Laterality: Left;  [name] VENTRICULOSTOMY Right 08/09/2018   Procedure: VENTRICULOSTOMY;  Surgeon: Lisbeth Renshaw, MD;  Location: [facility] OR;  Service: Neurosurgery;  Laterality: Right;    There were no vitals filed for this visit.  Subjective Assessment - 09/26/18 1257    Subjective  "Can I interrupt? I don't know most of the questions."            ADULT SLP TREATMENT - 09/26/18 0800      General Information   Behavior/Cognition  Alert;Cooperative      Treatment Provided   Treatment provided  Cognitive-Linquistic  Pain Assessment   Pain Assessment  No/denies pain      Cognitive-Linquistic Treatment   Treatment focused on  Aphasia;Cognition    Skilled Treatment  Speech tx: 28 minutes. SLP provided verbal education and handouts re: aphasia, communication strategies. Wife told SLP about a communication breakdown which occurred the previous evening, with pt attempting to request hot apple cider. SLP used to example to demonstrate multimodal communication  approaches. SLP used written keywords to demonstrate way to provide support for auditory comprehension and clarify pt's message. Cognition: 17 minutes. Completed CLQT subtests; pt stress/anxiety and auditory comprehension deficits necessitated extended time. Scores are as follows: Non-linguistic Cognition 17/49 (severe), Linguistic/aphasia: 37/56 (moderate), clock drawing: 7/13 (severe).       Assessment / Recommendations / Plan   Plan  Continue with current plan of care      Progression Toward Goals   Progression toward goals  Progressing toward goals       SLP Education - 09/26/18 1307    Education Details  compensations for aphasia, supportive conversation    Person(s) Educated  Patient;Spouse;Child(ren)    Methods  Explanation;Demonstration;Verbal cues;Handout    Comprehension  Verbalized understanding;Need further instruction       SLP Short Term Goals - 09/26/18 1308      SLP SHORT TERM GOAL #1   Title  Pt will complete standardized assessment of cognition.    Time  1    Period  Weeks    Status  Achieved      SLP SHORT TERM GOAL #2   Title  Pt will ID object/picture to simple description/feature/function f:4 with occasional min A over 3 sessions     Time  4    Period  Weeks    Status  On-going      SLP SHORT TERM GOAL #3   Title  Pt will follow 2-step verbal directions with occasional min A 80% accuracy over 3 sessions     Time  4    Period  Weeks    Status  On-going      SLP SHORT TERM GOAL #4   Title  Pt will name basic objects/pictures 7/10 correct with occasional mod A over 3 sessions     Time  4    Period  Weeks    Status  On-going      SLP SHORT TERM GOAL #5   Title  Pt will demo sustained attention for 10 minutes in simple cognitive-linguistic task x3 sessions.    Time  4    Period  Weeks    Status  New      Additional Short Term Goals   Additional Short Term Goals  Yes      SLP SHORT TERM GOAL #6   Title  Pt will have a memory system to assist in  managing appointments, schedules, medical and therapy information and bring with him to 3 therapy sessions     Time  4    Period  Weeks    Status  New       SLP Long Term Goals - 09/26/18 1315      SLP LONG TERM GOAL #1   Title  Pt will write or name 5 items in a category with occasional min A over 4 sessions     Time  8    Period  Weeks   or 17 visits, for all LTGs   Status  On-going      SLP LONG TERM GOAL #2  Title  Pt will demo error awareness by attempting correction or by nonverbal response to errors 75% of the time over 3 sessions    Period  Weeks    Status  On-going      SLP LONG TERM GOAL #3   Title  Pt will participate functionally in 10 minutes simple-mod complex conversation with conversational supports for aphasia over 3 sessions.    Time  8    Period  Weeks    Status  On-going      SLP LONG TERM GOAL #4   Title  Pt will demo selective attention in min noisy environment for 10 minutes in a simple-mod complex cognitive linguistic task over three sessions    Time  8    Period  Weeks    Status  New      SLP LONG TERM GOAL #5   Title  Pt will utilize memory compensation system to recall details/manage appointments, schedules, medical and therapy information with rare min A over 4 sessions      Time  8    Period  Weeks    Status  New       Plan - 09/26/18 1307    Clinical Impression Statement  Mr. 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 CLQT completed today - pt demonstrated significant anxiety and some frustration. Goals updated. Continue skilled ST to maximize communication and cognition for wants/needs, safety, independence and QOL.    Speech Therapy Frequency  2x / week    Duration  --   8 weeks or 17 visits   Treatment/Interventions  Cognitive reorganization;Multimodal communcation approach;Environmental controls;Compensatory strategies;Language facilitation;Compensatory techniques;Cueing  hierarchy;Internal/external aids;Functional tasks;SLP instruction and feedback;Patient/family education    Potential to Achieve Goals  Good    Potential Considerations  Severity of impairments    Consulted and Agree with Plan of Care  Patient;Family member/caregiver    Family Member Consulted  daughter, spouse       Patient will benefit from skilled therapeutic intervention in order to improve the following deficits and impairments:   Cognitive communication deficit  Aphasia    Problem List Patient Active Problem List   Diagnosis Date Noted  . Visual disturbance   . Slow transit constipation   . Hypoalbuminemia due to protein-calorie malnutrition (HCC)   . Acute blood loss anemia   . S/P VP shunt   . Hydrocephalus (HCC) 08/30/2018  . Dyslipidemia   . History of CVA (cerebrovascular accident)   . Benign essential HTN   . Tachycardia   . Leukocytosis   . Hyponatremia   . Hypokalemia   . 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 Bacterial meningitis 08/09/2018  . TIA (transient ischemic attack) 01/29/2017  . Acute encephalopathy   . Shunt malfunction 03/13/2016  . 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 Hyperlipidemia 10/20/2014  . Degenerative disc disease, lumbar 04/15/2013  . Routine general medical examination at a health care facility 07/23/2012  . DISTURBANCE OF SKIN SENSATION 10/05/2008  . HYPERLIPIDEMIA 01/01/2008  . MYCOPLASMA PNEUMONIA 01/01/2008   Rondel BatonMary Beth Adreanna Fickel, MS, CCC-SLP Speech-Language Pathologist  Arlana LindauMary E Mozelle Remlinger 09/26/2018, 1:17 PM  [facility] [address] [place], [place], [address] Phone: [phone]   Fax:  [phone]   Name: Celene SquibbKurt Edward Mungo MRN: [id number] Date of Birth: 10/27/1956

## 2018-09-26 NOTE — Therapy (Signed)
[facility] [address] [place], [place], [address] Phone: [phone]   Fax:  [phone]  Occupational Therapy Treatment  Patient Details  Name: Charles Marquez MRN: [id number] Date of Birth: 1956-06-25 Referring Provider (OT): Dr. Marcello Fennel   Encounter Date: 09/26/2018  OT End of Session - 09/26/18 1640    Visit Number  3    Date for OT Re-Evaluation  10/17/18    Authorization Type  BCBS 90 visits for all disciplines    OT Start Time  1530    OT Stop Time  1612    OT Time Calculation (min)  42 min    Activity Tolerance  Patient tolerated treatment well    Behavior During Therapy  [facility] for tasks assessed/performed       Past Medical History:  Diagnosis Date  . Anxiety   . Hypercholesteremia   . Stroke (HCC)    tia's  . TIA (transient ischemic attack)    09.15    Past Surgical History:  Procedure Laterality Date  . Fractured arm Left 12  . HERNIA REPAIR Right 3/12  . LAPAROSCOPIC REVISION VENTRICULAR-PERITONEAL (V-P) SHUNT N/A 08/26/2018   Procedure: LAPAROSCOPIC INSERTION VENTRICULAR-PERITONEAL (V-P) SHUNT;  Surgeon: Julio Sicks, MD;  Location: [facility] OR;  Service: Neurosurgery;  Laterality: N/A;  . LOOP RECORDER INSERTION N/A 04/10/2017   Procedure: Loop Recorder Insertion;  Surgeon: Hillis Range, MD;  [facility]: [facility] INVASIVE CV LAB;  Service: Cardiovascular;  Laterality: N/A;  . SHUNT REMOVAL Right 03/13/2016   Procedure: SHUNT REMOVAL;  Surgeon: Julio Sicks, MD;  Location: [facility] NEURO ORS;  Service: Neurosurgery;  Laterality: Right;  . SHUNT REMOVAL Right 08/09/2018   Procedure: SHUNT REMOVAL With Placement of Ventricular Catheter;  Surgeon: Lisbeth Renshaw, MD;  Location: [facility] OR;  Service: Neurosurgery;  Laterality: Right;  . SHUNT REVISION Right 08/05/2018   Procedure: SHUNT REVISION;  Surgeon: Julio Sicks, MD;  Location: [facility] OR;  Service: Neurosurgery;  Laterality: Right;  . SHUNT REVISION  VENTRICULAR-PERITONEAL Left 08/26/2018   Procedure: SHUNT REVISION VENTRICULAR-PERITONEAL;  Surgeon: Julio Sicks, MD;  Location[facility] OR;  Service: Neurosurgery;  Laterality: Left;  . SHUNT REVISION VENTRICULAR-PERITONEAL Left 09/02/2018   Procedure: Left Occipital VP shunt revision;  Surgeon: Julio Sicks, MD;  Location: [facility] OR;  Service: Neurosurgery;  Laterality: Left;  [name] VASECTOMY  10/02/1997  . 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 VENTRICULOPERITONEAL SHUNT Right 07/22/2018   Procedure: Shunt Placment right occipital;  Surgeon: Julio Sicks, MD;  Location: [facility] OR;  Service: Neurosurgery;  Laterality: Right;  . VENTRICULOPERITONEAL SHUNT Left 08/26/2018   Procedure: LEFT SIDED VENTRICULAR-PERITONEAL SHUNT;  Surgeon: Julio Sicks, MD;  Location: [facility] OR;  Service: Neurosurgery;  Laterality: Left;  [name] VENTRICULOSTOMY Right 08/09/2018   Procedure: VENTRICULOSTOMY;  Surgeon: Lisbeth Renshaw, MD;  Location: [facility] OR;  Service: Neurosurgery;  Laterality: Right;    Vitals:   09/26/18 1629 09/26/18 1637 09/26/18 1638  BP: (!) 132/100 120/76 (!) 133/93    Subjective Assessment - 09/26/18 1629    Pain Score  2     Pain Location  Head    Pain Descriptors / Indicators  Aching    Pain Type  Chronic pain    Pain Onset  1 to 4 weeks ago  OT Treatments/Exercises (OP) - 09/26/18 0001      ADLs   UB Dressing  Discussed with patient and wife ways to progress independence with ADL.  Currently wife is laying out patient's clothing for him, and he is dressing himself.  Encouraged her to allow patient to begin to pick out his own clothing, and cue only as needed.      Cooking  Patient is helping in the kitchen, working side by side with wife.  Recently he prepared sandwhiches while  she cleaned fruit for lunch.  Patient is folding laundry, and assisting with loading / unloading dishwasher.        Cognitive Exercises   Other Cognitive Exercises 1  Worked with constant therapy program on simple matching game (non language based) to address focused- selective attention.  Patient familiar with matching game, and quickly acclimated to game on tablet.  Patient initially randomly made choices, but after cueing was more deliberate in his choices and more accurate.  Attempts to distract patient made little difference once he was familiar with objective, although switching mental set for new task - pattern recognition, required increased cueing initially.      Other Cognitive Exercises 2  Patient reporting visula changes - although difficult to ascertain deficit due to aphasia.  BP remained stable this session see vitals.               OT Education - 09/26/18 1639    Education Details  discussed importance of recognizing when overwhelmed, frustrated and let family know    Person(s) Educated  Patient;Spouse    Methods  Explanation;Demonstration    Comprehension  Verbalized understanding;Need further instruction       OT Short Term Goals - 09/19/18 1653      OT SHORT TERM GOAL #1   Title  --        OT Long Term Goals - 09/24/18 1128      OT LONG TERM GOAL #1   Title  Pt and family will be mod I with home activities program to address self care and cognition via functional tasks and structured schedule - 10/17/2108    Status  On-going      OT LONG TERM GOAL #2   Title  Pt will be mod I with basic ADL tasks     Status  On-going      OT LONG TERM GOAL #3   Title  Pt will demonstrate sufficient attention to attend to familiar functional task in moderately busy environment.    Status  On-going      OT LONG TERM GOAL #4   Title  Pt will be mod I with sequencing for basic self care tasks    Status  On-going      OT LONG TERM GOAL #5   Title  Pt will be supervision  for simple, familiar home mgmt tasks    Status  On-going      OT LONG TERM GOAL #6   Title  Pt will be able to copy simple sentence with at least 90% accuracy    Status  Revised   09/24/2018 pt able to copy first and last name today - will revise goal           Plan - 09/26/18 1640    Clinical Impression Statement  Patient with very supportive family, wife and patient report increasing independence with familiar functional routines    Occupational Profile and client history currently impacting functional performance  PMH:  multiple shunt revisions, anxiety, TIA, LUE fx, communicating hydrocelphalus.     Occupational performance deficits (Please refer to evaluation for details):  ADL's;IADL's;Rest and Sleep;Work;Leisure;Social Participation    Rehab Potential  Good    OT Frequency  2x / week    OT Duration  4 weeks    OT Treatment/Interventions  Self-care/ADL training;Neuromuscular education;Building services engineerunctional Mobility Training;Therapeutic activities;Cognitive remediation/compensation;Visual/perceptual remediation/compensation;Patient/family education;Balance training    Clinical Decision Making  Several treatment options, min-mod task modification necessary    Consulted and Agree with Plan of Care  Patient;Family member/caregiver       Patient will benefit from skilled therapeutic intervention in order to improve the following deficits and impairments:  Decreased balance, Decreased cognition, Decreased safety awareness, Decreased mobility, Impaired vision/preception  Visit Diagnosis: Muscle weakness (generalized)  Unsteadiness on feet  Visuospatial deficit  Apraxia  Other symptoms and signs involving cognitive functions following unspecified cerebrovascular disease  Apraxia following unspecified cerebrovascular disease    Problem List Patient Active Problem List   Diagnosis Date Noted  . Visual disturbance   . Slow transit constipation   . Hypoalbuminemia due to  protein-calorie malnutrition (HCC)   . Acute blood loss anemia   . S/P VP shunt   . Hydrocephalus (HCC) 08/30/2018  . Dyslipidemia   . History of CVA (cerebrovascular accident)   . Benign essential HTN   . Tachycardia   . Leukocytosis   . Hyponatremia   . Hypokalemia   . 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 Bacterial meningitis 08/09/2018  . TIA (transient ischemic attack) 01/29/2017  . Acute encephalopathy   . Shunt malfunction 03/13/2016  . 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 Hyperlipidemia 10/20/2014  . Degenerative disc disease, lumbar 04/15/2013  . Routine general medical examination at a health care facility 07/23/2012  . DISTURBANCE OF SKIN SENSATION 10/05/2008  . HYPERLIPIDEMIA 01/01/2008  . MYCOPLASMA PNEUMONIA 01/01/2008    Collier SalinaGellert, Ryszard Socarras M, OTR/L 09/26/2018, 4:44 PM  [facility] [address] [place], [place], [address] Phone: [phone]   Fax:  [phone]  Name: Celene SquibbKurt Edward Saner MRN: [id number] Date of Birth: 30-Apr-1956

## 2018-09-30 ENCOUNTER — Ambulatory Visit (INDEPENDENT_AMBULATORY_CARE_PROVIDER_SITE_OTHER): Payer: BLUE CROSS/BLUE SHIELD

## 2018-09-30 DIAGNOSIS — I639 Cerebral infarction, unspecified: Secondary | ICD-10-CM

## 2018-09-30 NOTE — Progress Notes (Signed)
Carelink Summary Report / Loop Recorder 

## 2018-10-01 ENCOUNTER — Encounter: Payer: Self-pay | Admitting: Physical Therapy

## 2018-10-01 ENCOUNTER — Ambulatory Visit: Payer: BLUE CROSS/BLUE SHIELD | Admitting: Occupational Therapy

## 2018-10-01 ENCOUNTER — Ambulatory Visit: Payer: BLUE CROSS/BLUE SHIELD | Admitting: Speech Pathology

## 2018-10-01 ENCOUNTER — Ambulatory Visit: Payer: BLUE CROSS/BLUE SHIELD | Admitting: Physical Therapy

## 2018-10-01 ENCOUNTER — Encounter: Payer: Self-pay | Admitting: Occupational Therapy

## 2018-10-01 VITALS — BP 110/70

## 2018-10-01 DIAGNOSIS — R2689 Other abnormalities of gait and mobility: Secondary | ICD-10-CM

## 2018-10-01 DIAGNOSIS — I69918 Other symptoms and signs involving cognitive functions following unspecified cerebrovascular disease: Secondary | ICD-10-CM

## 2018-10-01 DIAGNOSIS — R41841 Cognitive communication deficit: Secondary | ICD-10-CM

## 2018-10-01 DIAGNOSIS — R482 Apraxia: Secondary | ICD-10-CM

## 2018-10-01 DIAGNOSIS — R41842 Visuospatial deficit: Secondary | ICD-10-CM

## 2018-10-01 DIAGNOSIS — I69990 Apraxia following unspecified cerebrovascular disease: Secondary | ICD-10-CM

## 2018-10-01 DIAGNOSIS — R4701 Aphasia: Secondary | ICD-10-CM

## 2018-10-01 DIAGNOSIS — M6281 Muscle weakness (generalized): Secondary | ICD-10-CM

## 2018-10-01 DIAGNOSIS — R2681 Unsteadiness on feet: Secondary | ICD-10-CM

## 2018-10-01 NOTE — Patient Instructions (Signed)
Access Code: [email or web address]  URL: [email or web address]  Date: 10/01/2018  Prepared by: Bufford LopeAudra Potter   Exercises  Side Stepping with Resistance at Thighs and Ankles - 4 reps - 1 sets - 1x daily - 3x weekly  Marching with Resistance - 4 reps - 1 sets - 1x daily - 3x weekly  Backward Walking with Counter Support - 4 reps - 1 sets - 1x daily - 5x weekly  Walking with Head Rotation - 4 reps - 1 sets - 1x daily - 5x weekly  Walking with Head Nod - 4 reps - 1 sets - 1x daily - 5x weekly  Toe Walking - 4 reps - 1 sets - 1x daily - 5x weekly  Romberg Stance with Eyes Closed - 3 sets - 30 hold - 1x daily - 7x weekly  Romberg Stance with Head Rotation - 3 sets - 30 hold - 1x daily - 7x weekly  Romberg Stance with Head Nods - 3 sets - 30 hold - 1x daily - 7x weekly  Tandem Stance with Support - 2-3 sets - 10 hold - 1x daily - 7x weekly  Tandem Stance with Head Rotation - 2 sets - 10 reps - 1x daily - 7x weekly  Romberg Stance on Foam Pad - 3 sets - 30 hold - 1x daily - 7x weekly  Romberg Stance Eyes Closed on Foam Pad - 3 sets - 30 hold - 1x daily - 7x weekly

## 2018-10-01 NOTE — Patient Instructions (Signed)
  TalkPath Therapy app for Speech practice  Username: [email or web address] Password: [email or web address]

## 2018-10-01 NOTE — Therapy (Signed)
[facility] [address] [place], [place], [address] Phone: [phone]   Fax:  [phone]  Physical Therapy Treatment  Patient Details  Name: Charles Marquez MRN: [id number] Date of Birth: [birth year] Referring Provider (PT): Dr. Allena Katz    Encounter Date: 10/01/2018  PT End of Session - 10/01/18 1446    Visit Number  4    Number of Visits  17    Date for PT Re-Evaluation  11/18/18    Authorization Type  BCBS OTHER     Authorization Time Period  PT/OT/ST/Hydro therapy covered x 90 days    PT Start Time  0935    PT Stop Time  1017    PT Time Calculation (min)  42 min    Activity Tolerance  Patient tolerated treatment well    Behavior During Therapy  [facility] for tasks assessed/performed       Past Medical History:  Diagnosis Date  . Anxiety   . Hypercholesteremia   . Stroke (HCC)    tia's  . TIA (transient ischemic attack)    09.15    Past Surgical History:  Procedure Laterality Date  . Fractured arm Left 12  . HERNIA REPAIR Right 3/12  . LAPAROSCOPIC REVISION VENTRICULAR-PERITONEAL (V-P) SHUNT N/A 08/26/2018   Procedure: LAPAROSCOPIC INSERTION VENTRICULAR-PERITONEAL (V-P) SHUNT;  Surgeon: Julio Sicks, MD;  [facility]: [facility] OR;  Service: Neurosurgery;  Laterality: N/A;  . LOOP RECORDER INSERTION N/A 04/10/2017   Procedure: Loop Recorder Insertion;  Surgeon: Hillis Range, MD;  [facility]: [facility] INVASIVE CV LAB;  Service: Cardiovascular;  Laterality: N/A;  . SHUNT REMOVAL Right 03/13/2016   Procedure: SHUNT REMOVAL;  Surgeon: Julio Sicks, MD;  [facility]: [facility] NEURO ORS;  Service: Neurosurgery;  Laterality: Right;  . SHUNT REMOVAL Right 08/09/2018   Procedure: SHUNT REMOVAL With Placement of Ventricular Catheter;  Surgeon: Lisbeth Renshaw, MD;  Location: [facility] OR;  Service: Neurosurgery;  Laterality: Right;  . SHUNT REVISION Right 08/05/2018   Procedure: SHUNT REVISION;  Surgeon: Julio Sicks, MD;  Location: [facility] OR;  Service:  Neurosurgery;  Laterality: Right;  . SHUNT REVISION VENTRICULAR-PERITONEAL Left 08/26/2018   Procedure: SHUNT REVISION VENTRICULAR-PERITONEAL;  Surgeon: Julio Sicks, MD;  Location: [facility] OR;  Service: Neurosurgery;  Laterality: Left;  . SHUNT REVISION VENTRICULAR-PERITONEAL Left 09/02/2018   Procedure: Left Occipital VP shunt revision;  Surgeon: Julio Sicks, MD;  Location: [facility] OR;  Service: Neurosurgery;  Laterality: Left;  [name] VASECTOMY  10/02/1997  . 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 VENTRICULOPERITONEAL SHUNT Right 07/22/2018   Procedure: Shunt Placment right occipital;  Surgeon: Julio Sicks, MD;  Location: [facility] OR;  Service: Neurosurgery;  Laterality: Right;  . VENTRICULOPERITONEAL SHUNT Left 08/26/2018   Procedure: LEFT SIDED VENTRICULAR-PERITONEAL SHUNT;  Surgeon: Julio Sicks, MD;  Location: [facility] OR;  Service: Neurosurgery;  Laterality: Left;  [name] VENTRICULOSTOMY Right 08/09/2018   Procedure: VENTRICULOSTOMY;  Surgeon: Lisbeth Renshaw, MD;  Location: [facility] OR;  Service: Neurosurgery;  Laterality: Right;    Vitals:   10/01/18 0941  BP: 110/70    Subjective Assessment - 10/01/18 0941    Subjective  Went to a family gathering this weekend, did well with conversation and interaction.  Slight headache today but BP has been holding steady.      Patient is accompained by:  Family member   Wife [name] and daughter [name]   Pertinent History   S/p VP shunt, Hydrocephalus, CVA with loop recorder insertion 04/10/2017 as well as R occipital VP shunt placement 2016, Infection of ventricular shunt, shunt malfunction, HLD, anxiety, TIA, Hypercholesteremia, acute blood loss anemia, HTN, Tachycardia, Leukocytosis, Hyponatremia, Bacterial Encephalitis, Bacterial Meningitis, Dyslipidemia, DDD lumbar spine, and  Mycoplasma Pneumonia.     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  Yes    Pain Score  2     Pain Location  Head    Pain Orientation  Left    Pain Descriptors / Indicators  Headache    Pain Type  Chronic pain                            Balance Exercises - 10/01/18 1442      Balance Exercises: Standing   Tandem Stance  Eyes open;Upper extremity support 1;Other reps (comment);10 secs   hold, no UE x 10 sec.  Head turns/nods x 10, 1 UE support     OTAGO PROGRAM   Backwards Walking  No support    Overall OTAGO Comments  For backwards walking, progressed to no hands performing at a little faster speed with sudden stop and turn.  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     Time  4    Period  Weeks    Status  New    Target Date  10/19/18      PT SHORT TERM GOAL #2   Title  Pt will ambulate 500 feet outdoors navigating uneven terrain, curbs, ramps, and perform dual cognitive tasks with therapist providing supervision to improve safety with community distances.     Time  4    Period  Weeks    Status  New    Target Date  10/19/18      PT SHORT TERM GOAL #3   Title  Pt will perform 12 steps with reciprocal stepping technique and single HR  demonstrating improvement in safety with community accessibility with therapist providing supervision.     Time  4    Period  Weeks    Status  New    Target Date  10/19/18      PT SHORT TERM GOAL #4   Title  Pt will report no LOB episodes when walking over his gravel drive way indicating improvement in safety with functional mobility at home.    Time  4    Period  Weeks    Status  New    Target Date  10/19/18      PT SHORT TERM GOAL #5   Title  Pt  will improve FGA score to >/= 20/30 indicating improvement in functional mobility.     Baseline  11/7: 16/30 indicating high fall risk potential     Time  4    Period  Weeks    Status  New    Target Date  10/19/18        PT Long Term Goals - 09/19/18 1530      PT LONG TERM GOAL #1   Title  Pt will be independent and compliant with performing is HEP to improve balance, strength, and functional mobility.    Time  8    Period  Weeks    Status  New    Target Date  11/18/18      PT LONG TERM GOAL #2   Title  Pt will ambulate 1000 feet outdoors at mod I level navigating uneven terrain, curbs, ramps, and perform dual cognitive tasks to increase safety with community distances.     Time  8    Period  Weeks    Status  New    Target Date  11/18/18      PT LONG TERM GOAL #3   Title  Pt will demonstrate 12 steps with no HR's at mod I level with good safety awareness to improve community accessibility.     Time  8    Period  Weeks    Status  New    Target Date  11/18/18      PT LONG TERM GOAL #4   Title  Pt will improve FGA score to >/=25/30 indicating improvement in functional mobility.     Time  8    Period  Weeks    Status  New    Target Date  11/18/18            Plan - 10/01/18 2039    Clinical Impression Statement  Pt demonstrated improved BP readings today.  Due to pt reporting increased difficulty performing balance exercises in tandem stance (bumps the walls more frequently), reviewed and adjusted each tandem balance  exercise and added in tandem stance with head nods.  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  Pt tolerated well, will continue to progress towards LTG.    Rehab Potential  Good    Clinical Impairments Affecting Rehab Potential  Pt has short term memory deficits and cognitive changes from baseline which may negatively influence his ability to recall exercises and progressions in therapy.     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  high level balance and strengthening in quadruped/ tall kneeling.  Avoid activities where he is bending forwards/head down    PT Home Exercise Plan  [id number]     Consulted and Agree with Plan of Care  Patient;Family member/caregiver    Family Member Consulted  wife- kim        Patient will benefit from skilled therapeutic intervention in order to improve the following deficits and impairments:  Abnormal gait, Decreased coordination, Decreased safety awareness, Impaired vision/preception, Decreased balance, Decreased cognition, Decreased mobility, Decreased strength  Visit Diagnosis: Muscle weakness (generalized)  Unsteadiness on  feet  Other abnormalities of gait and mobility     Problem List Patient Active Problem List   Diagnosis Date Noted  . Visual disturbance   . Slow transit constipation   . Hypoalbuminemia due to protein-calorie malnutrition  (HCC)   . Acute blood loss anemia   . S/P VP shunt   . Hydrocephalus (HCC) 08/30/2018  . Dyslipidemia   . History of CVA (cerebrovascular accident)   . Benign essential HTN   . Tachycardia   . Leukocytosis   . Hyponatremia   . Hypokalemia   . 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 Bacterial meningitis 08/09/2018  . TIA (transient ischemic attack) 01/29/2017  . Acute encephalopathy   . Shunt malfunction 03/13/2016  . 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 Hyperlipidemia 10/20/2014  . Degenerative disc disease, lumbar 04/15/2013  . Routine general medical examination at a health care facility 07/23/2012  . DISTURBANCE OF SKIN SENSATION 10/05/2008  . HYPERLIPIDEMIA 01/01/2008  . MYCOPLASMA PNEUMONIA 01/01/2008    Dierdre Highman, PT, DPT 10/01/18    8:45 PM    [facility] [address] [place], [place], [address] Phone: [phone]   Fax:  [phone]  Name: Charles Marquez MRN: [id number] Date of Birth: 1956/09/11

## 2018-10-01 NOTE — Therapy (Signed)
[facility] [address] Charles Mar[place], [place], [address] Phone: [phone]   Fax:  [phone]  Speech Language Pathology Treatment  Patient Details  Name: Charles Marquez MRN: [id number] Date of Birth: 1956/03/10 Referring Provider (SLP): Dr. Allena Katz   Encounter Date: 10/01/2018  End of Session - 10/01/18 1256    Visit Number  4    Number of Visits  17    Date for SLP Re-Evaluation  11/18/18    Authorization Type  BCBS 90 visits for all disciplines     SLP Start Time  1018    SLP Stop Time   1104    SLP Time Calculation (min)  46 min    Activity Tolerance  Patient tolerated treatment well       Past Medical History:  Diagnosis Date  . Anxiety   . Hypercholesteremia   . Stroke (HCC)    tia's  . TIA (transient ischemic attack)    09.15    Past Surgical History:  Procedure Laterality Date  . Fractured arm Left 12  . HERNIA REPAIR Right 3/12  . LAPAROSCOPIC REVISION VENTRICULAR-PERITONEAL (V-P) SHUNT N/A 08/26/2018   Procedure: LAPAROSCOPIC INSERTION VENTRICULAR-PERITONEAL (V-P) SHUNT;  Surgeon: Julio Sicks, MD;  [facility] OR;  Service: Neurosurgery;  Laterality: N/A;  . LOOP RECORDER INSERTION N/A 04/10/2017   Procedure: Loop Recorder Insertion;  Surgeon: Hillis Range, MD;  [facility] INVAS[facility] CV LAB;  Service: Cardiovascular;  Laterality: N/A;  . SHUNT REMOVAL Right 03/13/2016   Procedure: SHUNT REMOVAL;  Surgeon: Julio Sicks, MD;  Location: [facility] NEURO ORS;  Service: Neurosurgery;  Laterality: Right;  . SHUNT REMOVAL Right 08/09/2018   Procedure: SHUNT REMOVAL With Placement of Ventricular Catheter;  Surgeon: Lisbeth Renshaw, MD;  Location: [facility] OR;  Service: Neurosurgery;  Laterality: Right;  . SHUNT REVISION Right 08/05/2018   Procedure: SHUNT REVISION;  Surgeon: Julio Sicks, MD;  Location: [facility] OR;  Service: Neurosurgery;  Laterality: Right;  . SHUNT REVISION VENTRICULAR-PERITONEAL Left 08/26/2018   Procedure: SHUNT REVISION VENTRICULAR-PERITONEAL;  Surgeon: Julio Sicks, MD;  Location: [facility] OR;  Service: Neurosurgery;  Laterality: Left;  . SHUNT REVISION VENTRICULAR-PERITONEAL Left 09/02/2018   Procedure: Left Occipital VP shunt revision;  Surgeon: Julio Sicks, MD;  Location: [facility] OR;  Service: Neurosurgery;  Laterality: Left;  [name] VASECTOMY  10/02/1997  . 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 VENTRICULOPERITONEAL SHUNT Right 07/22/2018   Procedure: Shunt Placment right occipital;  Surgeon: Julio Sicks, MD;  Location: [facility] OR;  Service: Neurosurgery;  Laterality: Right;  . VENTRICULOPERITONEAL SHUNT Left 08/26/2018   Procedure: LEFT SIDED VENTRICULAR-PERITONEAL SHUNT;  Surgeon: Julio Sicks, MD;  Location: [facility] OR;  Service: Neurosurgery;  Laterality: Left;  [name] VENTRICULOSTOMY Right 08/09/2018   Procedure: VENTRICULOSTOMY;  Surgeon: Lisbeth Renshaw, MD;  Location: [facility] OR;  Service: Neurosurgery;  Laterality: Right;    There were no vitals filed for this visit.  Subjective Assessment - 10/01/18 1019    Subjective  "I don't know, what was it, visual?" Pt, re: what appointment he had before speech (PT)    Patient is accompained by:  Family member   wife   Currently in Pain?  Yes    Pain Score  2     Pain Location  Head    Pain  Orientation  Left    Pain Type  Chronic pain            ADULT SLP TREATMENT - 10/01/18 1018      General Information   Behavior/Cognition  Alert;Cooperative      Treatment Provided   Treatment provided  Cognitive-Linquistic      Cognitive-Linquistic Treatment   Treatment focused on  Aphasia;Cognition    Skilled Treatment  SLP targeted pt's deficits in auditory comprehension, expressive language and sustained attention; pt ID'd photo to description of  feature/function from f:4, 100% accuracy, min question cue x1. Following simple directions (2 step or 2 component) 91% accuracy. Pt hesitant, anxious at times, requiring reassurance. Sequencing of simple pictures (3-6 items) was 80% correct, with occasional min A. Cues required for sustained attention throughout tasks, on average 6-7 minutes after switching tasks. Assisted pt with registering for [employer] therapy account to work on cognition and language at home; pt required mod cues for error awareness and to reduce impulsivity when typing his name and email address into registration fields.      Assessment / Recommendations / Plan   Plan  Continue with current plan of care      Progression Toward Goals   Progression toward goals  Progressing toward goals         SLP Short Term Goals - 10/01/18 1021      SLP SHORT TERM GOAL #1   Title  Pt will complete standardized assessment of cognition.    Status  Achieved      SLP SHORT TERM GOAL #2   Title  Pt will ID object/picture to simple description/feature/function f:4 with occasional min A over 3 sessions     Baseline  10/01/18    Time  3    Period  Weeks    Status  On-going      SLP SHORT TERM GOAL #3   Title  Pt will follow 2-step verbal directions with occasional min A 80% accuracy over 3 sessions     Time  3    Period  Weeks    Status  On-going      SLP SHORT TERM GOAL #4   Title  Pt will name basic objects/pictures 7/10 correct with occasional mod A over 3 sessions     Time  3    Period  Weeks    Status  On-going      SLP SHORT TERM GOAL #5   Title  Pt will demo sustained attention for 10 minutes in simple cognitive-linguistic task x3 sessions.    Time  3    Period  Weeks    Status  On-going      SLP SHORT TERM GOAL #6   Title  Pt will have a memory system to assist in managing appointments, schedules, medical and therapy information and bring with him to 3 therapy sessions     Time  3    Period  Weeks    Status   On-going       SLP Long Term Goals - 10/01/18 1258      SLP LONG TERM GOAL #1   Title  Pt will write or name 5 items in a category with occasional min A over 4 sessions     Time  7    Period  Weeks   or 17 visits, for all LTGs   Status  On-going      SLP LONG TERM GOAL #2   Title  Pt will  demo error awareness by attempting correction or by nonverbal response to errors 75% of the time over 3 sessions    Time  7    Period  Weeks    Status  On-going      SLP LONG TERM GOAL #3   Title  Pt will participate functionally in 10 minutes simple-mod complex conversation with conversational supports for aphasia over 3 sessions.    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 Pt continues to exhibit anxiety and some frustration with errors. Continue skilled ST to maximize communication and cognition for wants/needs, safety, independence and QOL.    Speech Therapy Frequency  2x / week    Duration  --   8 weeks or 17 visits   Treatment/Interventions  Cognitive reorganization;Multimodal communcation approach;Environmental controls;Compensatory strategies;Language facilitation;Compensatory techniques;Cueing hierarchy;Internal/external aids;Functional tasks;SLP instruction and feedback;Patient/family education    Potential to Achieve Goals  Good    Potential Considerations  Severity  of impairments       Patient will benefit from skilled therapeutic intervention in order to improve the following deficits and impairments:   Cognitive communication deficit  Aphasia    Problem List Patient Active Problem List   Diagnosis Date Noted  . Visual disturbance   . Slow transit constipation   . Hypoalbuminemia due to protein-calorie malnutrition (HCC)   . Acute blood loss anemia   . S/P VP shunt   . Hydrocephalus (HCC) 08/30/2018  . Dyslipidemia   . History of CVA (cerebrovascular accident)   . Benign essential HTN   . Tachycardia   . Leukocytosis   . Hyponatremia   . Hypokalemia   . 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 Bacterial meningitis 08/09/2018  . TIA (transient ischemic attack) 01/29/2017  . Acute encephalopathy   . Shunt malfunction 03/13/2016  . 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 Hyperlipidemia 10/20/2014  . Degenerative disc disease, lumbar 04/15/2013  . Routine general medical examination at a health care facility 07/23/2012  . DISTURBANCE OF SKIN SENSATION 10/05/2008  . HYPERLIPIDEMIA 01/01/2008  . MYCOPLASMA PNEUMONIA 01/01/2008   Charles BatonMary Beth Tyriana Helmkamp, MS, CCC-SLP Speech-Language Pathologist   Charles Marquez 10/01/2018, 12:59 PM  [facility] [address] [place], [place], [address] Phone: [phone]   Fax:  [phone]   Name: Charles Marquez MRN: [id number] Date of Birth: 1956-04-29

## 2018-10-01 NOTE — Therapy (Signed)
[facility] [address] [place], [place], [address] Phone: [phone]   Fax:  [phone]  Occupational Therapy Treatment  Patient Details  Name: Zayin Valadez MRN: [id number] Date of Birth: Oct 29, 1956 Referring Provider (OT): Dr. Marcello Fennel   Encounter Date: 10/01/2018  OT End of Session - 10/01/18 1222    Visit Number  4    Number of Visits  9    Date for OT Re-Evaluation  10/17/18    Authorization Type  [employer] 90 visits for all disciplines    OT Start Time  1103    OT Stop Time  1145    OT Time Calculation (min)  42 min    Activity Tolerance  Patient tolerated treatment well       Past Medical History:  Diagnosis Date  . Anxiety   . Hypercholesteremia   . Stroke (HCC)    tia's  . TIA (transient ischemic attack)    09.15    Past Surgical History:  Procedure Laterality Date  . Fractured arm Left 12  . HERNIA REPAIR Right 3/12  . LAPAROSCOPIC REVISION VENTRICULAR-PERITONEAL (V-P) SHUNT N/A 08/26/2018   Procedure: LAPAROSCOPIC INSERTION VENTRICULAR-PERITONEAL (V-P) SHUNT;  Surgeon: Julio Sicks, MD;  [facility]: [facility] OR;  Service: Neurosurgery;  Laterality: N/A;  . LOOP RECORDER INSERTION N/A 04/10/2017   Procedure: Loop Recorder Insertion;  Surgeon: Hillis Range, MD;  [facility]: [facility] INVASIVE CV LAB;  Service: Cardiovascular;  Laterality: N/A;  . SHUNT REMOVAL Right 03/13/2016   Procedure: SHUNT REMOVAL;  Surgeon: Julio Sicks, MD;  Location: [facility] NEURO ORS;  Service: Neurosurgery;  Laterality: Right;  . SHUNT REMOVAL Right 08/09/2018   Procedure: SHUNT REMOVAL With Placement of Ventricular Catheter;  Surgeon: Lisbeth Renshaw, MD;  Location: [facility] OR;  Service: Neurosurgery;  Laterality: Right;  . SHUNT REVISION Right 08/05/2018   Procedure: SHUNT REVISION;  Surgeon: Julio Sicks, MD;  Location: [facility] OR;  Service: Neurosurgery;  Laterality: Right;  . SHUNT REVISION VENTRICULAR-PERITONEAL Left 08/26/2018   Procedure: SHUNT REVISION VENTRICULAR-PERITONEAL;  Surgeon: Julio Sicks, MD;  Location: [facility] OR;  Service: Neurosurgery;  Laterality: Left;  . SHUNT REVISION VENTRICULAR-PERITONEAL Left 09/02/2018   Procedure: Left Occipital VP shunt revision;  Surgeon: Julio Sicks, MD;  Location: [facility] OR;  Service: Neurosurgery;  Laterality: Left;  [name] VASECTOMY  10/02/1997  . 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 VENTRICULOPERITONEAL SHUNT Right 07/22/2018   Procedure: Shunt Placment right occipital;  Surgeon: Julio Sicks, MD;  Location: [facility] OR;  Service: Neurosurgery;  Laterality: Right;  . VENTRICULOPERITONEAL SHUNT Left 08/26/2018   Procedure: LEFT SIDED VENTRICULAR-PERITONEAL SHUNT;  Surgeon: Julio Sicks, MD;  Location: [facility] OR;  Service: Neurosurgery;  Laterality: Left;  [name] VENTRICULOSTOMY Right 08/09/2018   Procedure: VENTRICULOSTOMY;  Surgeon: Lisbeth Renshaw, MD;  Location: [facility] OR;  Service: Neurosurgery;  Laterality: Right;    There were no vitals filed for this visit.  Subjective Assessment - 10/01/18 1108    Subjective   The headaches not bad today.     Patient is accompained by:  Family member   wife   Pertinent History  S/p multiple VP shunt revisions, shunt infection, hydrocephalus    Patient Stated Goals  well you know - I want this better (pt is aphasic)    Currently  in Pain?  Yes    Pain Score  2     Pain Location  Head    Pain Orientation  Left    Pain Descriptors / Indicators  Headache    Pain Type  Chronic pain    Pain Onset  1 to 4 weeks ago    Pain Frequency  Constant    Aggravating Factors   noise and bending over    Effect of Pain on Daily Activities  tylenol                   OT Treatments/Exercises (OP) - 10/01/18 0001      ADLs   Cooking  Addressed simple familiar  hot meal prep (scrambled eggs with cheese) - pt able to communicate needs for finding items and demonstrated good safety awareness during session. Pt able to organize and sequence activity with extra time.  Pt also able to attend to task with door open to busy clinic as well as this therapist providing intentional distraction.  Pt also able to organize ktichen clean up. 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  Both in agreement. Also discussed using written meal planning and written grocery list as therapeutic activities to address organization skills.      Writing  Addressed copying at simple sentence level. Pt able to read simple sentences today however displays lettter perseveration when copying sentence.                   OT Long Term Goals - 09/24/18 1128      OT LONG TERM GOAL #1   Title  Pt and family will be mod I with home activities program to address self care and cognition via functional tasks and structured schedule - 10/17/2108    Status  On-going      OT LONG TERM GOAL #2   Title  Pt will be mod I with basic ADL tasks     Status  On-going      OT LONG TERM GOAL #3   Title  Pt will demonstrate sufficient attention to attend to familiar functional task in moderately busy environment.    Status  On-going      OT LONG TERM GOAL #4   Title  Pt will be mod I with sequencing for basic self care tasks    Status  On-going      OT LONG TERM GOAL #5   Title  Pt will be supervision for simple, familiar home mgmt tasks    Status  On-going      OT LONG TERM GOAL #6   Title  Pt will be able to copy simple sentence with at least 90% accuracy    Status  Revised   09/24/2018 pt able to copy first and last name today - will revise goal           Plan - 10/01/18 1221    Clinical Impression Statement  Pt with excellent progress toward OT goals. Pt reports pt  is now picking out own clothes.     Occupational Profile and client history currently impacting functional performance  PMH: multiple shunt revisions, anxiety, TIA, LUE fx, communicating hydrocelphalus.     Occupational performance deficits (Please refer to evaluation for details):  ADL's;IADL's;Rest and Sleep;Work;Leisure;Social Participation    Rehab Potential  Good    OT Frequency  2x / week    OT Duration  4 weeks    OT Treatment/Interventions  Self-care/ADL training;Neuromuscular education;Building services engineerunctional Mobility Training;Therapeutic activities;Cognitive remediation/compensation;Visual/perceptual remediation/compensation;Patient/family education;Balance training    Plan  create structured schedule, continue to address any ADL needs as well as organization, sequencing, problem solving, visual scanning and simple copying    Consulted and Agree with Plan of Care  Patient;Family member/caregiver    Family Member Consulted  wife       Patient will benefit from skilled therapeutic intervention in order to improve the following deficits and impairments:  Decreased balance, Decreased cognition, Decreased safety awareness, Decreased mobility, Impaired vision/preception  Visit Diagnosis: Muscle weakness (generalized)  Unsteadiness on feet  Visuospatial deficit  Apraxia  Other symptoms and signs involving cognitive functions following unspecified cerebrovascular disease  Apraxia following unspecified cerebrovascular disease    Problem List Patient Active Problem List   Diagnosis Date Noted  . Visual disturbance   . Slow transit constipation   . Hypoalbuminemia due to protein-calorie malnutrition (HCC)   . Acute blood loss anemia   . S/P VP shunt   . Hydrocephalus (HCC) 08/30/2018  . Dyslipidemia   . History of CVA (cerebrovascular accident)   . Benign essential HTN   . Tachycardia   . Leukocytosis   . Hyponatremia   . Hypokalemia   . 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 Infection  of ventricular shunt (HCC) 08/09/2018  . Bacterial meningitis 08/09/2018  . TIA (transient ischemic attack) 01/29/2017  . Acute encephalopathy   . Shunt malfunction 03/13/2016  . 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 Hyperlipidemia 10/20/2014  . Degenerative disc disease, lumbar 04/15/2013  . Routine general medical examination at a health care facility 07/23/2012  . DISTURBANCE OF SKIN SENSATION 10/05/2008  . HYPERLIPIDEMIA 01/01/2008  . MYCOPLASMA PNEUMONIA 01/01/2008    Norton PastelPulaski, Chijioke Lasser Halliday, OTR/L 10/01/2018, 12:23 PM  [facility] [address] [place], [place], [address] Phone: [phone]   Fax:  [phone]  Name: Celene SquibbKurt Edward Somoza MRN: [id number] Date of Birth: 08/30/1956

## 2018-10-02 ENCOUNTER — Encounter: Payer: BLUE CROSS/BLUE SHIELD | Admitting: Physical Medicine & Rehabilitation

## 2018-10-03 ENCOUNTER — Ambulatory Visit: Payer: BLUE CROSS/BLUE SHIELD | Admitting: Speech Pathology

## 2018-10-03 ENCOUNTER — Encounter: Payer: Self-pay | Admitting: Speech Pathology

## 2018-10-03 DIAGNOSIS — R4701 Aphasia: Secondary | ICD-10-CM

## 2018-10-03 DIAGNOSIS — R41841 Cognitive communication deficit: Secondary | ICD-10-CM

## 2018-10-03 DIAGNOSIS — M6281 Muscle weakness (generalized): Secondary | ICD-10-CM | POA: Diagnosis not present

## 2018-10-03 NOTE — Therapy (Signed)
[facility] [address] [place], [place], [address] Phone: [phone]   Fax:  [phone]  Speech Language Pathology Treatment  Patient Details  Name: Charles Marquez MRN: [id number] Date of Birth: 05-Apr-1956 Referring Provider (SLP): Dr. Allena Katz   Encounter Date: 10/03/2018  End of Session - 10/03/18 1158    Visit Number  5    Number of Visits  17    Date for SLP Re-Evaluation  11/18/18    Authorization Type  BC[employer] 90 visits for all disciplines     SLP Start Time  0932    SLP Stop Time   1015    SLP Time Calculation (min)  43 min    Activity Tolerance  Patient tolerated treatment well       Past Medical History:  Diagnosis Date  . Anxiety   . Hypercholesteremia   . Stroke (HCC)    tia's  . TIA (transient ischemic attack)    09.15    Past Surgical History:  Procedure Laterality Date  . Fractured arm Left 12  . HERNIA REPAIR Right 3/12  . LAPAROSCOPIC REVISION VENTRICULAR-PERITONEAL (V-P) SHUNT N/A 08/26/2018   Procedure: LAPAROSCOPIC INSERTION VENTRICULAR-PERITONEAL (V-P) SHUNT;  Surgeon: Julio Sicks, MD;  [facility]: [facility] OR;  Service: Neurosurgery;  Laterality: N/A;  . LOOP RECORDER INSERTION N/A 04/10/2017   Procedure: Loop Recorder Insertion;  Surgeon: Hillis Range, MD;  [facility];  Service: Cardiovascular;  Laterality: N/A;  . SHUNT REMOVAL Right 03/13/2016   Procedure: SHUNT REMOVAL;  Surgeon: Julio Sicks, MD;  Location: [facility] NE[facility]S;  Service: Neurosurgery;  Laterality: Right;  . SHUNT REMOVAL Right 08/09/2018   Procedure: SHUNT REMOVAL With Placement of Ventricular Catheter;  Surgeon: Lisbeth Renshaw, MD;  Location: [facility] OR;  Service: Neurosurgery;  Laterality: Right;  . SHUNT REVISION Right 08/05/2018   Procedure: SHUNT REVISION;  Surgeon: Julio Sicks, MD;  Location: [facility] OR;  Service: Neurosurgery;  Laterality: Right;  . SHUNT REVISION VENTRICULAR-PERITONEAL Left 08/26/2018   Procedure: SHUNT REVISION VENTRICULAR-PERITONEAL;  Surgeon: Julio Sicks, MD;  Location: [facility] OR;  Service: Neurosurgery;  Laterality: Left;  . SHUNT REVISION VENTRICULAR-PERITONEAL Left 09/02/2018   Procedure: Left Occipital VP shunt revision;  Surgeon: Julio Sicks, MD;  Location: [facility] OR;  Service: Neurosurgery;  Laterality: Left;  [name] VASECTOMY  10/02/1997  . 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 VENTRICULOPERITONEAL SHUNT Right 07/22/2018   Procedure: Shunt Placment right occipital;  Surgeon: Julio Sicks, MD;  Location: [facility] OR;  Service: Neurosurgery;  Laterality: Right;  . VENTRICULOPERITONEAL SHUNT Left 08/26/2018   Procedure: LEFT SIDED VENTRICULAR-PERITONEAL SHUNT;  Surgeon: Julio Sicks, MD;  Location: [facility] OR;  Service: Neurosurgery;  Laterality: Left;  [name] VENTRICULOSTOMY Right 08/09/2018   Procedure: VENTRICULOSTOMY;  Surgeon: Lisbeth Renshaw, MD;  Location: [facility] OR;  Service: Neurosurgery;  Laterality: Right;    There were no vitals filed for this visit.  Subjective Assessment - 10/03/18 1152    Currently in Pain?  No/denies            ADULT SLP TREATMENT - 10/03/18 1021      General Information   Behavior/Cognition  Alert;Cooperative      Treatment Provided   Treatment provided  Cognitive-Linquistic      Cognitive-Linquistic Treatment   Treatment  focused on  Aphasia;Cognition    Skilled Treatment  Pt/ report he utilizing calendar with more independnece at home and less verbal reminders from spouse. Pt is also completing chores at home. Spouse reminding pt to take meds occasionally. Targeted audtiory comprehension and word finding in descriptive task with mildly slow rate and pauses to A auditory comprehension and occasional min to mod 1st letter and written cues for naming.  Written expression facilitated with divergent naming task of IT words (pt's profression) with occasional min A to ID spelling errors and usual questioning and verbal cues for word finding.       Assessment / Recommendations / Plan   Plan  Continue with current plan of care      Progression Toward Goals   Progression toward goals  Progressing toward goals         SLP Short Term Goals - 10/03/18 1157      SLP SHORT TERM GOAL #1   Title  Pt will complete standardized assessment of cognition.    Status  Achieved      SLP SHORT TERM GOAL #2   Title  Pt will ID object/picture to simple description/feature/function f:4 with occasional min A over 3 sessions     Baseline  10/01/18; 10/03/18    Time  3    Period  Weeks    Status  On-going      SLP SHORT TERM GOAL #3   Title  Pt will follow 2-step verbal directions with occasional min A 80% accuracy over 3 sessions     Baseline  10/03/18;    Time  3    Period  Weeks    Status  On-going      SLP SHORT TERM GOAL #4   Title  Pt will name basic objects/pictures 7/10 correct with occasional mod A over 3 sessions     Baseline  10/03/18    Time  3    Period  Weeks    Status  On-going      SLP SHORT TERM GOAL #5   Title  Pt will demo sustained attention for 10 minutes in simple cognitive-linguistic task x3 sessions.    Time  3    Period  Weeks    Status  On-going      SLP SHORT TERM GOAL #6   Title  Pt will have a memory system to assist in managing appointments, schedules, medical and therapy information and bring with him to 3 therapy sessions     Time  3    Period  Weeks    Status  On-going       SLP Long Term Goals - 10/03/18 1157      SLP LONG TERM GOAL #1   Title  Pt will write or name 5 items in a category with occasional min A over 4 sessions     Time  7    Period  Weeks   or 17 visits, for all LTGs   Status  On-going      SLP LONG TERM GOAL #2   Title  Pt will demo error awareness by attempting correction or by  nonverbal response to errors 75% of the time over 3 sessions    Time  7    Period  Weeks    Status  On-going      SLP LONG TERM GOAL #3   Title  Pt will participate functionally in 10 minutes simple-mod complex conversation with conversational supports for aphasia over  3 sessions.    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 Pt continues to exhibit anxiety and some frustration with errors. Continue skilled ST to maximize communication and cognition for wants/needs, safety, independence and QOL.    Speech Therapy Frequency  2x / week    Duration  --   8 weeks or 17 visits   Treatment/Interventions  Cognitive reorganization;Multimodal communcation approach;Environmental controls;Compensatory strategies;Language facilitation;Compensatory techniques;Cueing hierarchy;Internal/external aids;Functional tasks;SLP instruction and feedback;Patient/family education    Potential to Achieve Goals  Good    Potential Considerations  Severity of impairments       Patient will benefit from skilled therapeutic intervention in order to improve the following deficits and impairments:   Aphasia  Cognitive communication deficit    Problem List Patient Active Problem List   Diagnosis Date Noted  . Visual disturbance   . Slow transit constipation   . Hypoalbuminemia due to  protein-calorie malnutrition (HCC)   . Acute blood loss anemia   . S/P VP shunt   . Hydrocephalus (HCC) 08/30/2018  . Dyslipidemia   . History of CVA (cerebrovascular accident)   . Benign essential HTN   . Tachycardia   . Leukocytosis   . Hyponatremia   . Hypokalemia   . 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 Bacterial meningitis 08/09/2018  . TIA (transient ischemic attack) 01/29/2017  . Acute encephalopathy   . Shunt malfunction 03/13/2016  . 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 Hyperlipidemia 10/20/2014  . Degenerative disc disease, lumbar 04/15/2013  . Routine general medical examination at a health care facility 07/23/2012  . DISTURBANCE OF SKIN SENSATION 10/05/2008  . HYPERLIPIDEMIA 01/01/2008  . MYCOPLASMA PNEUMONIA 01/01/2008    Esraa Seres, Radene JourneyLaura Ann MS, CCC-SLP 10/03/2018, 11:58 AM   [facility] [address] [place], [place], [address] Phone: [phone]   Fax:  [phone]   Name: Charles Marquez MRN: [id number] Date of Birth: 07-10-1956

## 2018-10-04 ENCOUNTER — Ambulatory Visit: Payer: BLUE CROSS/BLUE SHIELD | Admitting: Physical Therapy

## 2018-10-04 ENCOUNTER — Encounter: Payer: Self-pay | Admitting: Occupational Therapy

## 2018-10-04 ENCOUNTER — Ambulatory Visit: Payer: BLUE CROSS/BLUE SHIELD | Admitting: Occupational Therapy

## 2018-10-04 VITALS — BP 130/100

## 2018-10-04 DIAGNOSIS — M6281 Muscle weakness (generalized): Secondary | ICD-10-CM

## 2018-10-04 DIAGNOSIS — R2681 Unsteadiness on feet: Secondary | ICD-10-CM

## 2018-10-04 DIAGNOSIS — I69918 Other symptoms and signs involving cognitive functions following unspecified cerebrovascular disease: Secondary | ICD-10-CM

## 2018-10-04 DIAGNOSIS — R2689 Other abnormalities of gait and mobility: Secondary | ICD-10-CM

## 2018-10-04 DIAGNOSIS — R482 Apraxia: Secondary | ICD-10-CM

## 2018-10-04 DIAGNOSIS — R41842 Visuospatial deficit: Secondary | ICD-10-CM

## 2018-10-04 NOTE — Therapy (Signed)
[facility] [address] [place], [place], [address] Phone: [phone]   Fax:  [phone]  Occupational Therapy Treatment  Patient Details  Name: Charles Marquez MRN: [id number] Date of Birth: 1956-04-16 Referring Provider (OT): Dr. Marcello Fennel   Encounter Date: 10/04/2018  OT End of Session - 10/04/18 1554    Visit Number  5    Number of Visits  9    Date for OT Re-Evaluation  10/17/18    Authorization Type  BC[employer] 90 visits for all disciplines    OT Start Time  1445    OT Stop Time  1529    OT Time Calculation (min)  44 min    Activity Tolerance  Patient tolerated treatment well    Behavior During Therapy  [place] for tasks assessed/performed       Past Medical History:  Diagnosis Date  . Anxiety   . Hypercholesteremia   . Stroke (HCC)    tia's  . TIA (transient ischemic attack)    09.15    Past Surgical History:  Procedure Laterality Date  . Fractured arm Left 12  . HERNIA REPAIR Right 3/12  . LAPAROSCOPIC REVISION VENTRICULAR-PERITONEAL (V-P) SHUNT N/A 08/26/2018   Procedure: LAPAROSCOPIC INSERTION VENTRICULAR-PERITONEAL (V-P) SHUNT;  Surgeon: Julio Sicks, MD;  [facility]: [facility] OR;  Service: Neurosurgery;  Laterality: N/A;  . LOOP RECORDER INSERTION N/A 04/10/2017   Procedure: Loop Recorder Insertion;  Surgeon: Hillis Range, MD;  [facility]: [facility] INVASIVE CV LAB;  Service: Cardiovascular;  Laterality: N/A;  . SHUNT REMOVAL Right 03/13/2016   Procedure: SHUNT REMOVAL;  Surgeon: Julio Sicks, MD;  Location: [facility] NEURO ORS;  Service: Neurosurgery;  Laterality: Right;  . SHUNT REMOVAL Right 08/09/2018   Procedure: SHUNT REMOVAL With Placement of Ventricular Catheter;  Surgeon: Lisbeth Renshaw, MD;  Location: [facility] OR;  Service: Neurosurgery;  Laterality: Right;  . SHUNT REVISION Right 08/05/2018   Procedure: SHUNT REVISION;  Surgeon: Julio Sicks, MD;  Location: [facility] OR;  Service: Neurosurgery;  Laterality: Right;  .  SHUNT REVISION VENTRICULAR-PERITONEAL Left 08/26/2018   Procedure: SHUNT REVISION VENTRICULAR-PERITONEAL;  Surgeon: Julio Sicks, MD;  Location: [facility] OR;  Service: Neurosurgery;  Laterality: Left;  . SHUNT REVISION VENTRICULAR-PERITONEAL Left 09/02/2018   Procedure: Left Occipital VP shunt revision;  Surgeon: Julio Sicks, MD;  Location: [facility] OR;  Service: Neurosurgery;  Laterality: Left;  [name] VASECTOMY  10/02/1997  . 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 VENTRICULOPERITONEAL SHUNT Right 07/22/2018   Procedure: Shunt Placment right occipital;  Surgeon: Julio Sicks, MD;  Location: [facility] OR;  Service: Neurosurgery;  Laterality: Right;  . VENTRICULOPERITONEAL SHUNT Left 08/26/2018   Procedure: LEFT SIDED VENTRICULAR-PERITONEAL SHUNT;  Surgeon: Julio Sicks, MD;  Location: [facility] OR;  Service: Neurosurgery;  Laterality: Left;  [name] VENTRICULOSTOMY Right 08/09/2018   Procedure: VENTRICULOSTOMY;  Surgeon: Lisbeth Renshaw, MD;  Location: [facility] OR;  Service: Neurosurgery;  Laterality: Right;    There were no vitals filed for this visit.  Subjective Assessment - 10/04/18 1546    Subjective   "Eggs" - when given a choice of three...regarding what did you cook earlier this week with Clydie Braun?    Patient is accompained by:  Family member    Pertinent History  S/p multiple VP shunt revisions, shunt infection, hydrocephalus  Patient Stated Goals  well you know - I want this better (pt is aphasic)    Currently in Pain?  No/denies    Pain Score  0-No pain                   OT Treatments/Exercises (OP) - 10/04/18 0001      ADLs   Writing  Working to improve both visual scanning as well as selective attention.  Patient able to recognize errors, initially with prompting and questioning cues, and could correct  errors with increased time and encouragement.  Patient able to copy 10 digit numbers - phone numbers, as well as familiar or novel sentences.  Patient did well with reading focus guide - yellow.  Wife familiar from classroom.  Showed electronic format as well, as patient planning to get onto desktop computer this weekend.  Patient has been using tablet at home.               OT Education - 10/04/18 1554    Education Details  reading focus guide to limit visual distractions on written page    Person(s) Educated  Patient;Spouse    Methods  Explanation;Demonstration    Comprehension  Returned demonstration;Verbalized understanding       OT Short Term Goals - 09/19/18 1653      OT SHORT TERM GOAL #1   Title  --        OT Long Term Goals - 10/04/18 1605      OT LONG TERM GOAL #1   Title  Pt and family will be mod I with home activities program to address self care and cognition via functional tasks and structured schedule - 10/17/2108    Status  On-going      OT LONG TERM GOAL #2   Title  Pt will be mod I with basic ADL tasks     Status  On-going      OT LONG TERM GOAL #3   Title  Pt will demonstrate sufficient attention to attend to familiar functional task in moderately busy environment.    Status  Achieved      OT LONG TERM GOAL #4   Title  Pt will be mod I with sequencing for basic self care tasks    Status  Achieved      OT LONG TERM GOAL #5   Title  Pt will be supervision for simple, familiar home mgmt tasks    Status  On-going            Plan - 10/04/18 1554    Clinical Impression Statement  Patient is showing excellent progress with noteable improvement in language, attention, and visual scanning which is translating to decreased assistance and cueing with ADL/IADL.      Occupational Profile and client history currently impacting functional performance  PMH: multiple shunt revisions, anxiety, TIA, LUE fx, communicating hydrocelphalus.     Occupational  performance deficits (Please refer to evaluation for details):  ADL's;IADL's;Rest and Sleep;Work;Leisure;Social Participation    Rehab Potential  Good    OT Frequency  2x / week    OT Duration  4 weeks    OT Treatment/Interventions  Self-care/ADL training;Neuromuscular education;Building services engineer;Therapeutic activities;Cognitive remediation/compensation;Visual/perceptual remediation/compensation;Patient/family education;Balance training    Plan  create structured schedule, continue to address any ADL needs as well as organization, sequencing, problem solving, visual scanning and simple copying    Clinical Decision Making  Several treatment options, min-mod task modification necessary    Consulted and  Agree with Plan of Care  Patient;Family member/caregiver    Family Member Consulted  wife       Patient will benefit from skilled therapeutic intervention in order to improve the following deficits and impairments:  Decreased balance, Decreased cognition, Decreased safety awareness, Decreased mobility, Impaired vision/preception  Visit Diagnosis: Muscle weakness (generalized)  Unsteadiness on feet  Visuospatial deficit  Apraxia  Other symptoms and signs involving cognitive functions following unspecified cerebrovascular disease    Problem List Patient Active Problem List   Diagnosis Date Noted  . Visual disturbance   . Slow transit constipation   . Hypoalbuminemia due to protein-calorie malnutrition (HCC)   . Acute blood loss anemia   . S/P VP shunt   . Hydrocephalus (HCC) 08/30/2018  . Dyslipidemia   . History of CVA (cerebrovascular accident)   . Benign essential HTN   . Tachycardia   . Leukocytosis   . Hyponatremia   . Hypokalemia   . 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 Bacterial meningitis 08/09/2018  . TIA (transient ischemic attack) 01/29/2017  . Acute encephalopathy   . Shunt malfunction 03/13/2016  .  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 Hyperlipidemia 10/20/2014  . Degenerative disc disease, lumbar 04/15/2013  . Routine general medical examination at a health care facility 07/23/2012  . DISTURBANCE OF SKIN SENSATION 10/05/2008  . HYPERLIPIDEMIA 01/01/2008  . MYCOPLASMA PNEUMONIA 01/01/2008    Collier SalinaGellert, Kristin M, OTR/L 10/04/2018, 4:07 PM  [facility] [address] [place], [place], [address] Phone: [phone]   Fax:  [phone]  Name: Charles Marquez MRN: [id number] Date of Birth: August 17, 1956

## 2018-10-04 NOTE — Therapy (Signed)
[facility] [address] [place], [place], [address] Phone: [phone]   Fax:  [phone]  Physical Therapy Treatment  Patient Details  Name: Charles Marquez MRN: [id number] Date of Birth: August 09, 1956 Referring Provider (PT): Dr. Allena Katz    Encounter Date: 10/04/2018  PT End of Session - 10/04/18 1658    Visit Number  5    Number of Visits  17    Date for PT Re-Evaluation  11/18/18    Authorization Type  BCBS OTHER     Authorization Time Period  PT/OT/ST/Hydro therapy covered x 90 days    PT Start Time  1400    PT Stop Time  1447    PT Time Calculation (min)  47 min    Activity Tolerance  Patient tolerated treatment well    Behavior During Therapy  [facility] for tasks assessed/performed       Past Medical History:  Diagnosis Date  . Anxiety   . Hypercholesteremia   . Stroke (HCC)    tia's  . TIA (transient ischemic attack)    09.15    Past Surgical History:  Procedure Laterality Date  . Fractured arm Left 12  . HERNIA REPAIR Right 3/12  . LAPAROSCOPIC REVISION VENTRICULAR-PERITONEAL (V-P) SHUNT N/A 08/26/2018   Procedure: LAPAROSCOPIC INSERTION VENTRICULAR-PERITONEAL (V-P) SHUNT;  Surgeon: Julio Sicks, MD;  [facility] OR;  Service: Neurosurgery;  Laterality: N/A;  . LOOP RECORDER INSERTION N/A 04/10/2017   Procedure: Loop Recorder Insertion;  Surgeon: Hillis Range, MD;  [facility] LAB;  Service: Cardiovascular;  Laterality: N/A;  . SHUNT REMOVAL Right 03/13/2016   Procedure: SHUNT REMOVAL;  Surgeon: Julio Sicks, MD;  [facility] NEURO ORS;  Service: Neurosurgery;  Laterality: Right;  . SHUNT REMOVAL Right 08/09/2018   Procedure: SHUNT REMOVAL With Placement of Ventricular Catheter;  Surgeon: Lisbeth Renshaw, MD;  Location: [facility] OR;  Service: Neurosurgery;  Laterality: Right;  . SHUNT REVISION Right 08/05/2018   Procedure: SHUNT REVISION;  Surgeon: Julio Sicks, MD;  Location: [facility] OR;  Service:  Neurosurgery;  Laterality: Right;  . SHUNT REVISION VENTRICULAR-PERITONEAL Left 08/26/2018   Procedure: SHUNT REVISION VENTRICULAR-PERITONEAL;  Surgeon: Julio Sicks, MD;  Location: [facility] OR;  Service: Neurosurgery;  Laterality: Left;  . SHUNT REVISION VENTRICULAR-PERITONEAL Left 09/02/2018   Procedure: Left Occipital VP shunt revision;  Surgeon: Julio Sicks, MD;  Location: [facility] OR;  Service: Neurosurgery;  Laterality: Left;  [name] VASECTOMY  10/02/1997  . 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 VENTRICULOPERITONEAL SHUNT Right 07/22/2018   Procedure: Shunt Placment right occipital;  Surgeon: Julio Sicks, MD;  Location: [facility] OR;  Service: Neurosurgery;  Laterality: Right;  . VENTRICULOPERITONEAL SHUNT Left 08/26/2018   Procedure: LEFT SIDED VENTRICULAR-PERITONEAL SHUNT;  Surgeon: Julio Sicks, MD;  Location: [facility] OR;  Service: Neurosurgery;  Laterality: Left;  [name] VENTRICULOSTOMY Right 08/09/2018   Procedure: VENTRICULOSTOMY;  Surgeon: Lisbeth Renshaw, MD;  Location: [facility] OR;  Service: Neurosurgery;  Laterality: Right;    Vitals:   10/04/18 1408 10/04/18 1700  BP: 120/80 (!) 130/100    Subjective Assessment - 10/04/18 1404    Subjective  No issues to report.  BP continues to remain steady.  No questions about exercises or exercise modifications.      Patient is accompained by:  Family member   Wife Charles Marquez and daughter Charles Marquez   Pertinent History   S/p VP shunt, Hydrocephalus, CVA with loop recorder insertion 04/10/2017 as well as R occipital VP shunt placement 2016, Infection of ventricular shunt, shunt malfunction, HLD, anxiety, TIA, Hypercholesteremia, acute blood loss anemia, HTN, Tachycardia, Leukocytosis, Hyponatremia, Bacterial Encephalitis, Bacterial Meningitis, Dyslipidemia, DDD lumbar spine, and  Mycoplasma Pneumonia.     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  No/denies                            Balance Exercises - 10/04/18 1427      Balance Exercises: Standing   Rockerboard  Anterior/posterior;Lateral;EO;10 reps   weight shifting   Step Ups  Forward;Lateral   no UE support, rockerboard ant/post, lat   Other Standing Exercises  Floor ladder for coordination, timing, sequencing and recall of sequence with side stepping, braiding, backwards stepping and in/out forwards/back and side          PT Short Term Goals - 09/19/18 1525      PT SHORT TERM GOAL #1   Title  Pt will participate in establishment of initial HEP to improve strength, balance, and functional mobility.     Time  4    Period  Weeks    Status  New    Target Date  10/19/18      PT SHORT TERM GOAL #2   Title  Pt will ambulate 500 feet outdoors navigating uneven terrain, curbs, ramps, and perform dual cognitive tasks with therapist providing supervision to improve safety with community distances.     Time  4    Period  Weeks    Status  New    Target Date  10/19/18      PT SHORT TERM GOAL #3   Title  Pt will perform 12 steps with reciprocal stepping technique and single HR demonstrating improvement in safety with community accessibility with therapist providing supervision.     Time  4    Period  Weeks    Status  New    Target Date  10/19/18      PT SHORT TERM GOAL #4   Title  Pt will report no LOB episodes when walking over his gravel drive way indicating improvement in safety with functional mobility at home.    Time  4    Period  Weeks    Status  New    Target Date  10/19/18      PT SHORT TERM GOAL #5   Title  Pt will improve FGA score to >/= 20/30 indicating improvement in functional mobility.     Baseline  11/7: 16/30  indicating high fall risk potential     Time  4    Period  Weeks    Status  New    Target Date  10/19/18        PT Long Term Goals - 09/19/18 1530      PT LONG TERM GOAL #1   Title  Pt will be independent and compliant with performing is HEP to improve balance, strength, and functional mobility.    Time  8    Period  Weeks    Status  New    Target Date  11/18/18      PT  LONG TERM GOAL #2   Title  Pt will ambulate 1000 feet outdoors at mod I level navigating uneven terrain, curbs, ramps, and perform dual cognitive tasks to increase safety with community distances.     Time  8    Period  Weeks    Status  New    Target Date  11/18/18      PT LONG TERM GOAL #3   Title  Pt will demonstrate 12 steps with no HR's at mod I level with good safety awareness to improve community accessibility.     Time  8    Period  Weeks    Status  New    Target Date  11/18/18      PT LONG TERM GOAL #4   Title  Pt will improve FGA score to >/=25/30 indicating improvement in functional mobility.     Time  8    Period  Weeks    Status  New    Target Date  11/18/18            Plan - 10/04/18 1701    Clinical Impression Statement  Pt demonstrated improvements in balance, sequencing of tasks and recall today.  Treatment session focused on dynamic weight shifting and stepping on unstable surface in various directions and agility training with floor ladder.  Pt did require 2 seated rest breaks due to diastolic BP elevation during exercises.  Pt tolerated well, required supervision-min A overall for balance and coordination exercises.  Will continue to progress towards LTG.    Rehab Potential  Good    Clinical Impairments Affecting Rehab Potential  Pt has short term memory deficits and cognitive changes from baseline which may negatively influence his ability to recall exercises and progressions in therapy.     PT Frequency  2x / week    PT Duration  8 weeks    PT Treatment/Interventions  ADLs/Self  Care Home Management;Therapeutic activities;Dry needling;Therapeutic exercise;Balance training;Neuromuscular re-education;Manual techniques;Visual/perceptual remediation/compensation;Patient/family education;Stair training;Gait training;Electrical Stimulation;Functional mobility training;Passive range of motion;DME Instruction    PT Next Visit Plan  ASSESS BP, dual tasking, increasing gait speed forwards/backwards/sudden stops/head turns to simulate officiating basketball.  high level balance and strengthening in quadruped/ tall kneeling.  Avoid activities where he is bending forwards/head down    PT Home Exercise Plan  [id number]     Consulted and Agree with Plan of Care  Patient;Family member/caregiver    Family Member Consulted  wife- kim        Patient will benefit from skilled therapeutic intervention in order to improve the following deficits and impairments:  Abnormal gait, Decreased coordination, Decreased safety awareness, Impaired vision/preception, Decreased balance, Decreased cognition, Decreased mobility, Decreased strength  Visit Diagnosis: Muscle weakness (generalized)  Unsteadiness on feet  Other abnormalities of gait and mobility     Problem List Patient Active Problem List   Diagnosis Date Noted  . Visual disturbance   . Slow transit constipation   . Hypoalbuminemia due to protein-calorie malnutrition (HCC)   . Acute blood loss anemia   . S/P VP shunt   . Hydrocephalus (HCC) 08/30/2018  . Dyslipidemia   . History of CVA (cerebrovascular accident)   . Benign essential HTN   . Tachycardia   . Leukocytosis   . Hyponatremia   . Hypokalemia   . 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 Bacterial meningitis 08/09/2018  . TIA (transient ischemic attack) 01/29/2017  . Acute encephalopathy   . Shunt malfunction  03/13/2016  . 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 Hyperlipidemia 10/20/2014  . Degenerative disc disease, lumbar 04/15/2013  . Routine general medical examination at a health care facility 07/23/2012  . DISTURBANCE OF SKIN SENSATION 10/05/2008  . HYPERLIPIDEMIA 01/01/2008  . MYCOPLASMA PNEUMONIA 01/01/2008    Dierdre Highman, PT, DPT 10/04/18    5:07 PM    [facility] [address] [place], [place], [address] Phone: [phone]   Fax:  [phone]  Name: Charles Marquez MRN: [id number] Date of Birth: [birth year]

## 2018-10-07 ENCOUNTER — Ambulatory Visit: Payer: BLUE CROSS/BLUE SHIELD | Admitting: Occupational Therapy

## 2018-10-07 ENCOUNTER — Encounter: Payer: Self-pay | Admitting: Occupational Therapy

## 2018-10-07 DIAGNOSIS — M6281 Muscle weakness (generalized): Secondary | ICD-10-CM | POA: Diagnosis not present

## 2018-10-07 DIAGNOSIS — I69918 Other symptoms and signs involving cognitive functions following unspecified cerebrovascular disease: Secondary | ICD-10-CM

## 2018-10-07 DIAGNOSIS — R482 Apraxia: Secondary | ICD-10-CM

## 2018-10-07 DIAGNOSIS — R2681 Unsteadiness on feet: Secondary | ICD-10-CM

## 2018-10-07 DIAGNOSIS — R41842 Visuospatial deficit: Secondary | ICD-10-CM

## 2018-10-07 NOTE — Therapy (Signed)
[facility] [address] [place], [place], [address] Phone: [phone]   Fax:  [phone]  Occupational Therapy Treatment  Patient Details  Name: Charles Marquez MRN: [id number] Date of Birth: August 04, 1956 Referring Provider (OT): Dr. Marcello Fennel   Encounter Date: 10/07/2018  OT End of Session - 10/07/18 1643    Visit Number  6    Number of Visits  9    Date for OT Re-Evaluation  10/17/18    Authorization Type  BCBS 90 visits for all disciplines    OT Start Time  1401    OT Stop Time  1445    OT Time Calculation (min)  44 min    Activity Tolerance  Patient tolerated treatment well       Past Medical History:  Diagnosis Date  . Anxiety   . Hypercholesteremia   . Stroke (HCC)    tia's  . TIA (transient ischemic attack)    09.15    Past Surgical History:  Procedure Laterality Date  . Fractured arm Left 12  . HERNIA REPAIR Right 3/12  . LAPAROSCOPIC REVISION VENTRICULAR-PERITONEAL (V-P) SHUNT N/A 08/26/2018   Procedure: LAPAROSCOPIC INSERTION VENTRICULAR-PERITONEAL (V-P) SHUNT;  Surgeon: Julio Sicks, MD;  [facility]: [facility] OR;  Service: Neurosurgery;  Laterality: N/A;  . LOOP RECORDER INSERTION N/A 04/10/2017   Procedure: Loop Recorder Insertion;  Surgeon: Hillis Range, MD;  Location: [facility] INVASIVE CV LAB;  Service: Cardiovascular;  Laterality: N/A;  . SHUNT REMOVAL Right 03/13/2016   Procedure: SHUNT REMOVAL;  Surgeon: Julio Sicks, MD;  Location: [facility] NEURO ORS;  Service: Neurosurgery;  Laterality: Right;  . SHUNT REMOVAL Right 08/09/2018   Procedure: SHUNT REMOVAL With Placement of Ventricular Catheter;  Surgeon: Lisbeth Renshaw, MD;  Location: [facility] OR;  Service: Neurosurgery;  Laterality: Right;  . SHUNT REVISION Right 08/05/2018   Procedure: SHUNT REVISION;  Surgeon: Julio Sicks, MD;  Location: [facility] OR;  Service: Neurosurgery;  Laterality: Right;  . SHUNT REVISION VENTRICULAR-PERITONEAL Left 08/26/2018    Procedure: SHUNT REVISION VENTRICULAR-PERITONEAL;  Surgeon: Julio Sicks, MD;  Location: [facility] OR;  Service: Neurosurgery;  Laterality: Left;  . SHUNT REVISION VENTRICULAR-PERITONEAL Left 09/02/2018   Procedure: Left Occipital VP shunt revision;  Surgeon: Julio Sicks, MD;  Location: [facility] OR;  Service: Neurosurgery;  Laterality: Left;  [name] VASECTOMY  10/02/1997  . 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 VENTRICULOPERITONEAL SHUNT Right 07/22/2018   Procedure: Shunt Placment right occipital;  Surgeon: Julio Sicks, MD;  [facility] OR;  Service: Neurosurgery;  Laterality: Right;  . VENTRICULOPERITONEAL SHUNT Left 08/26/2018   Procedure: LEFT SIDED VENTRICULAR-PERITONEAL SHUNT;  Surgeon: Julio Sicks, MD;  Location: [facility] OR;  Service: Neurosurgery;  Laterality: Left;  [name] VENTRICULOSTOMY Right 08/09/2018   Procedure: VENTRICULOSTOMY;  Surgeon: Lisbeth Renshaw, MD;  Location: [facility] OR;  Service: Neurosurgery;  Laterality: Right;    There were no vitals filed for this visit.  Subjective Assessment - 10/07/18 1409    Subjective   I guess I am doing more!    Patient is accompained by:  Family member   wife   Pertinent History  S/p multiple VP shunt revisions, shunt infection, hydrocephalus    Patient Stated Goals  well you know - I want this better (pt is aphasic)  Currently in Pain?  No/denies                   OT Treatments/Exercises (OP) - 10/07/18 0001      ADLs   ADL Comments  Wife and pt report pt is now completely independent in self care activities.  Also report that pt is starting to intiate and do more activities around the house (i.e. sweeping leaves off deck, cooking more, cleaning up more ) without cueing.  Educated pt and wife on various forms of cueing and how to reduce cueing to  encourge pt to problem solve, make decisions and detect errors - info given in writing and pt and wife verbalized understanding. Wife stated she would also share info with pt's adult children.  Provided pt and wife with structured schedule for use at home to assist with indepedence and initiation.  WIfe and pt both verbalized understanding (this will be helpful because I am going back to work - wife).  Also educated pt and wife on return to driving recommendations.  Strongly recommend that pt pursue driving eval when MD and therapists feel pt is ready - do not feel pt is ready at this time and pt and wife in agreement.  Pt and wife verbalized understanding and were in agreement with recomendation.               OT Education - 10/07/18 1641    Education Details  driving eval, hierarchy for cueing, structured schedule.    Person(s) Educated  Patient;Spouse    Methods  Explanation;Handout    Comprehension  Verbalized understanding       OT Short Term Goals - 09/19/18 1653      OT SHORT TERM GOAL #1   Title  --        OT Long Term Goals - 10/07/18 1641      OT LONG TERM GOAL #1   Title  Pt and family will be mod I with home activities program to address self care and cognition via functional tasks and structured schedule - 10/17/2108    Status  Achieved      OT LONG TERM GOAL #2   Title  Pt will be mod I with basic ADL tasks     Status  Achieved      OT LONG TERM GOAL #3   Title  Pt will demonstrate sufficient attention to attend to familiar functional task in moderately busy environment.    Status  Achieved      OT LONG TERM GOAL #4   Title  Pt will be mod I with sequencing for basic self care tasks    Status  Achieved      OT LONG TERM GOAL #5   Title  Pt will be supervision for simple, familiar home mgmt tasks    Status  On-going      OT LONG TERM GOAL #6   Title  Pt will be able to copy simple sentence with at least 90% accuracy    Status  On-going             Plan - 10/07/18 1642    Clinical Impression Statement  Pt progressing toward goals. Pt with improving speech as well.     Occupational Profile and client history currently impacting functional performance  PMH: multiple shunt revisions, anxiety, TIA, LUE fx, communicating hydrocelphalus.     Occupational performance deficits (Please refer to evaluation for details):  ADL's;IADL's;Rest and Sleep;Work;Leisure;Social Participation  Rehab Potential  Good    OT Frequency  2x / week    OT Duration  4 weeks    OT Treatment/Interventions  Self-care/ADL training;Neuromuscular education;Building services engineerunctional Mobility Training;Therapeutic activities;Cognitive remediation/compensation;Visual/perceptual remediation/compensation;Patient/family education;Balance training    Plan  address writing strategies for copying, generating simple sentence, and microcaligraphy.  Address cooking activity with more than 1 item.      Consulted and Agree with Plan of Care  Patient;Family member/caregiver    Family Member Consulted  wife       Patient will benefit from skilled therapeutic intervention in order to improve the following deficits and impairments:  Decreased balance, Decreased cognition, Decreased safety awareness, Decreased mobility, Impaired vision/preception  Visit Diagnosis: Muscle weakness (generalized)  Unsteadiness on feet  Visuospatial deficit  Apraxia  Other symptoms and signs involving cognitive functions following unspecified cerebrovascular disease    Problem List Patient Active Problem List   Diagnosis Date Noted  . Visual disturbance   . Slow transit constipation   . Hypoalbuminemia due to protein-calorie malnutrition (HCC)   . Acute blood loss anemia   . S/P VP shunt   . Hydrocephalus (HCC) 08/30/2018  . Dyslipidemia   . History of CVA (cerebrovascular accident)   . Benign essential HTN   . Tachycardia   . Leukocytosis   . Hyponatremia   . Hypokalemia   . Bacterial  encephalitis 08/10/2018  . 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 Bacterial meningitis 08/09/2018  . TIA (transient ischemic attack) 01/29/2017  . Acute encephalopathy   . Shunt malfunction 03/13/2016  . 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 Hyperlipidemia 10/20/2014  . Degenerative disc disease, lumbar 04/15/2013  . Routine general medical examination at a health care facility 07/23/2012  . DISTURBANCE OF SKIN SENSATION 10/05/2008  . HYPERLIPIDEMIA 01/01/2008  . MYCOPLASMA PNEUMONIA 01/01/2008    Norton PastelPulaski,  Halliday, OTR/L 10/07/2018, 4:44 PM  [facility] [address] [place], [place], [address] Phone: [phone]   Fax:  [phone]  Name: Charles Marquez MRN: [id number] Date of Birth: 04/03/1956

## 2018-10-07 NOTE — Patient Instructions (Signed)
Local Driver Evaluation Programs: ° °Comprehensive Evaluation: includes clinical and in vehicle behind the wheel testing by OCCUPATIONAL THERAPIST. Programs have varying levels of adaptive controls available for trial.  ° °[facility] °[address] °[place], [place]  [address] °[phone] or [phone] °http://www.driver-rehab.com °Evaluator:  Cyndee Crompton, OT/CDRS/CDI/SCDCM/Low Vision Certification ° °[facility] °[address] °[place], [place] [address] °[phone] °https://www.[facility].org/home/services/rehabilitation.aspx °Evaluators:  Shannon Sheek, OT and Jill Tucker, OT ° °[facility] [place] (ONLY SERVES VETERANS!!) °[facility] °[address] °[place], [place]  [address] °[phone] °http://www.[place].va.gov/services/Physical_Medicine_Rehabilitation_Services.asp °Evaluators:  Eric Andrews, KT; Heidi Harris, KT;  Gary Whitaker, KT (KT=kiniesotherapist) ° ° °Clinical evaluations only:  Includes clinical testing, refers to other programs or local certified driving instructor for behind the wheel testing. ° °[facility] (outpatient Rehab) °[facility] °[address] °[place], [place] [address] °[phone] for scheduling °http://www.[facility].edu/Outpatient-Rehabilitation/Neurorehabilitation-Therapy.htm °Evaluators:  Kelly Lambeth, OT; Kate Phillips, OT ° °Other area clinical evaluators available upon request including [facility], [facility] and [facility]. ° ° °    Resource List °What is a Driver Evaluation: °Your Road Ahead - A Guide to Comprehensive Driving Evaluations °http://www.thehartford.com/resources/mature-market-excellence/publications-on-aging ° °Association for Driver Rehabilitation Services - Disability and Driving Fact Sheets °http://www.aded.net/?page=510 ° °Driving after a Brain  Injury: °[employer] °http://www.biausa.org/tbims-abstracts/if-there-is-an-effective-way-to-determine-if-someone-is-ready-to-drive-after-tbi?A=SearchResult&SearchID=9495675&ObjectID=2758842&ObjectType=35 ° °Driving with Adaptive Equipment: °[facility] Process °http://www.driver-rehab.com/adaptive-equipment ° °National Mobility Equipment [employer]ers Association °http://www.nmeda.com/ ° ° ° ° ° ° °  °

## 2018-10-08 ENCOUNTER — Ambulatory Visit: Payer: BLUE CROSS/BLUE SHIELD | Admitting: Physical Therapy

## 2018-10-08 ENCOUNTER — Ambulatory Visit: Payer: BLUE CROSS/BLUE SHIELD | Admitting: Occupational Therapy

## 2018-10-08 ENCOUNTER — Encounter: Payer: Self-pay | Admitting: Occupational Therapy

## 2018-10-08 DIAGNOSIS — R482 Apraxia: Secondary | ICD-10-CM

## 2018-10-08 DIAGNOSIS — M6281 Muscle weakness (generalized): Secondary | ICD-10-CM | POA: Diagnosis not present

## 2018-10-08 DIAGNOSIS — R2689 Other abnormalities of gait and mobility: Secondary | ICD-10-CM

## 2018-10-08 DIAGNOSIS — R41842 Visuospatial deficit: Secondary | ICD-10-CM

## 2018-10-08 DIAGNOSIS — I69918 Other symptoms and signs involving cognitive functions following unspecified cerebrovascular disease: Secondary | ICD-10-CM

## 2018-10-08 DIAGNOSIS — R2681 Unsteadiness on feet: Secondary | ICD-10-CM

## 2018-10-08 NOTE — Therapy (Signed)
[facility] [address] [place], [place], [address] Phone: [phone]   Fax:  [phone]  Occupational Therapy Treatment  Patient Details  Name: Charles Marquez MRN: [id number] Date of Birth: 11-01-1956 Referring Provider (OT): Dr. Marcello FennelAnkit Anil Patel   Encounter Date: 10/08/2018  OT End of Session - 10/08/18 1619    Visit Number  7    Date for OT Re-Evaluation  10/17/18    Authorization Type  BC[employer] 90 visits for all disciplines    OT Start Time  1400    OT Stop Time  1445    OT Time Calculation (min)  45 min    Activity Tolerance  Patient tolerated treatment well    Behavior During Therapy  [facility] for tasks assessed/performed       Past Medical History:  Diagnosis Date  . Anxiety   . Hypercholesteremia   . Stroke (HCC)    tia's  . TIA (transient ischemic attack)    09.15    Past Surgical History:  Procedure Laterality Date  . Fractured arm Left 12  . HERNIA REPAIR Right 3/12  . LAPAROSCOPIC REVISION VENTRICULAR-PERITONEAL (V-P) SHUNT N/A 08/26/2018   Procedure: LAPAROSCOPIC INSERTION VENTRICULAR-PERITONEAL (V-P) SHUNT;  Surgeon: Julio SicksPool, Henry, MD;  Location: [facility] OR;  Service: Neurosurgery;  Laterality: N/A;  . LOOP RECORDER INSERTION N/A 04/10/2017   Procedure: Loop Recorder Insertion;  Surgeon: Hillis RangeAllred, James, MD;  [facility]: [facility] INVASIVE CV LAB;  Service: Cardiovascular;  Laterality: N/A;  . SHUNT REMOVAL Right 03/13/2016   Procedure: SHUNT REMOVAL;  Surgeon: Julio SicksHenry Pool, MD;  Location: [facility] NEURO ORS;  Service: Neurosurgery;  Laterality: Right;  . SHUNT REMOVAL Right 08/09/2018   Procedure: SHUNT REMOVAL With Placement of Ventricular Catheter;  Surgeon: Lisbeth RenshawNundkumar, Neelesh, MD;  [facility];  Service: Neurosurgery;  Laterality: Right;  . SHUNT REVISION Right 08/05/2018   Procedure: SHUNT REVISION;  Surgeon: Julio SicksPool, Henry, MD;  [facility];  Service: Neurosurgery;  Laterality: Right;  . SHUNT REVISION  VENTRICULAR-PERITONEAL Left 08/26/2018   Procedure: SHUNT REVISION VENTRICULAR-PERITONEAL;  Surgeon: Julio SicksPool, Henry, MD;  [facility];  Service: Neurosurgery;  Laterality: Left;  . SHUNT REVISION VENTRICULAR-PERITONEAL Left 09/02/2018   Procedure: Left Occipital VP shunt revision;  Surgeon: Julio SicksPool, Henry, MD;  [facility];  Service: Neurosurgery;  Laterality: Left;  [name]. VASECTOMY  10/02/1997  . VENTRICULOPERITONEAL SHUNT Right 12/18/2014   Procedure: Shunt Placment - right occipital VP shunt ;  Surgeon: Temple PaciniHenry A Pool, MD;  [facility] NEURO ORS;  Service: Neurosurgery;  Laterality: Right;  Shunt Placment - right occipital VP shunt   . VENTRICULOPERITONEAL SHUNT Right 07/22/2018   Procedure: Shunt Placment right occipital;  Surgeon: Julio SicksPool, Henry, MD;  [facility];  Service: Neurosurgery;  Laterality: Right;  . VENTRICULOPERITONEAL SHUNT Left 08/26/2018   Procedure: LEFT SIDED VENTRICULAR-PERITONEAL SHUNT;  Surgeon: Julio SicksPool, Henry, MD;  [facility];  Service: Neurosurgery;  Laterality: Left;  [name]. VENTRICULOSTOMY Right 08/09/2018   Procedure: VENTRICULOSTOMY;  Surgeon: Lisbeth RenshawNundkumar, Neelesh, MD;  [facility];  Service: Neurosurgery;  Laterality: Right;    There were no vitals filed for this visit.  Subjective Assessment - 10/08/18 1412    Subjective   No pain.  The last few days have been very happy days - no headaches for last few days.      Patient is accompained by:  Family member    Pertinent History  S/p multiple VP shunt revisions, shunt infection, hydrocephalus    Patient Stated  Goals  well you know - I want this better (pt is aphasic)    Currently in Pain?  No/denies    Pain Score  0-No pain                   OT Treatments/Exercises (OP) - 10/08/18 0001      ADLs   Cooking  Worked with aptient in kitchen setting.  Wife present as well.  Patient given  instructions to prepare something from ingredients in refrigerator.  Patient able to locate needed items, and prepare an  egg burrito with cheels and salsa.  Patient able to organize task, and execute task with very limited guidance.  Patient was able to ask clarifying questions to undersatnd task, locate items, confirm stovetop controls.      Writing  Worked on micrographia - working to increase size as wella s accuracy with printed words.  Patient is improving accuracy, but still had one perseverative error when copying a novel sentence.  Patient with fewer errirs this session,a nd larger print, better spaced, more legible handwriting.               OT Education - 10/08/18 1618    Education Details  discussed vision evaluation options    Person(s) Educated  Patient;Spouse    Methods  Explanation    Comprehension  Verbalized understanding       OT Short Term Goals - 09/19/18 1653      OT SHORT TERM GOAL #1   Title  --        OT Long Term Goals - 10/07/18 1641      OT LONG TERM GOAL #1   Title  Pt and family will be mod I with home activities program to address self care and cognition via functional tasks and structured schedule - 10/17/2108    Status  Achieved      OT LONG TERM GOAL #2   Title  Pt will be mod I with basic ADL tasks     Status  Achieved      OT LONG TERM GOAL #3   Title  Pt will demonstrate sufficient attention to attend to familiar functional task in moderately busy environment.    Status  Achieved      OT LONG TERM GOAL #4   Title  Pt will be mod I with sequencing for basic self care tasks    Status  Achieved      OT LONG TERM GOAL #5   Title  Pt will be supervision for simple, familiar home mgmt tasks    Status  On-going      OT LONG TERM GOAL #6   Title  Pt will be able to copy simple sentence with at least 90% accuracy    Status  On-going            Plan - 10/08/18 1619    Clinical Impression Statement  Patient is on track to meet all of his OT goals due to improved attention, language skills, organization, and decreased headaches.      Occupational  Profile and client history currently impacting functional performance  PMH: multiple shunt revisions, anxiety, TIA, LUE fx, communicating hydrocelphalus.     Occupational performance deficits (Please refer to evaluation for details):  ADL's;IADL's;Rest and Sleep;Work;Leisure;Social Participation    Rehab Potential  Good    OT Frequency  2x / week    OT Duration  4 weeks    OT Treatment/Interventions  Self-care/ADL training;Neuromuscular  education;Building services engineer;Therapeutic activities;Cognitive remediation/compensation;Visual/perceptual remediation/compensation;Patient/family education;Balance training    Plan  visual scanning, and handwritingto address micrographia    Clinical Decision Making  Several treatment options, min-mod task modification necessary    Consulted and Agree with Plan of Care  Patient;Family member/caregiver    Family Member Consulted  wife       Patient will benefit from skilled therapeutic intervention in order to improve the following deficits and impairments:  Decreased balance, Decreased cognition, Decreased safety awareness, Decreased mobility, Impaired vision/preception  Visit Diagnosis: Visuospatial deficit  Apraxia  Other symptoms and signs involving cognitive functions following unspecified cerebrovascular disease  Muscle weakness (generalized)  Unsteadiness on feet    Problem List Patient Active Problem List   Diagnosis Date Noted  . Visual disturbance   . Slow transit constipation   . Hypoalbuminemia due to protein-calorie malnutrition (HCC)   . Acute blood loss anemia   . S/P VP shunt   . Hydrocephalus (HCC) 08/30/2018  . Dyslipidemia   . History of CVA (cerebrovascular accident)   . Benign essential HTN   . Tachycardia   . Leukocytosis   . Hyponatremia   . Hypokalemia   . 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 Bacterial meningitis 08/09/2018  . TIA (transient ischemic  attack) 01/29/2017  . Acute encephalopathy   . Shunt malfunction 03/13/2016  . 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 Hyperlipidemia 10/20/2014  . Degenerative disc disease, lumbar 04/15/2013  . Routine general medical examination at a health care facility 07/23/2012  . DISTURBANCE OF SKIN SENSATION 10/05/2008  . HYPERLIPIDEMIA 01/01/2008  . MYCOPLASMA PNEUMONIA 01/01/2008    Collier Salina, OTR/L 10/08/2018, 4:21 PM  [facility] [address] [place], [place], [address] Phone: [phone]   Fax:  [phone]  Name: Charles Marquez MRN: [id number] Date of Birth: 13-Nov-1956

## 2018-10-08 NOTE — Therapy (Signed)
[facility] [address] [place], [place], [address] Phone: [phone]   Fax:  [phone]  Physical Therapy Treatment  Patient Details  Name: Charles Marquez MRN: [id number] Date of Birth: 06-11-1956 Referring Provider (PT): Dr. Allena Katz    Encounter Date: 10/08/2018  PT End of Session - 10/08/18 1647    Visit Number  6    Number of Visits  17    Date for PT Re-Evaluation  11/18/18    Authorization Type  BCBS OTHER     Authorization Time Period  PT/OT/ST/Hydro therapy covered x 90 days    PT Start Time  1530    PT Stop Time  1613    PT Time Calculation (min)  43 min    Activity Tolerance  Patient tolerated treatment well    Behavior During Therapy  [facility] for tasks assessed/performed       Past Medical History:  Diagnosis Date  . Anxiety   . Hypercholesteremia   . Stroke (HCC)    tia's  . TIA (transient ischemic attack)    09.15    Past Surgical History:  Procedure Laterality Date  . Fractured arm Left 12  . HERNIA REPAIR Right 3/12  . LAPAROSCOPIC REVISION VENTRICULAR-PERITONEAL (V-P) SHUNT N/A 08/26/2018   Procedure: LAPAROSCOPIC INSERTION VENTRICULAR-PERITONEAL (V-P) SHUNT;  Surgeon: Julio Sicks, MD;  Location: [facility] OR;  Service: Neurosurgery;  Laterality: N/A;  . LOOP RECORDER INSERTION N/A 04/10/2017   Procedure: Loop Recorder Insertion;  Surgeon: Hillis Range, MD;  [facility]: [facility] INVASIVE CV LAB;  Service: Cardiovascular;  Laterality: N/A;  . SHUNT REMOVAL Right 03/13/2016   Procedure: SHUNT REMOVAL;  Surgeon: Julio Sicks, MD;  Location: [facility] NEURO ORS;  Service: Neurosurgery;  Laterality: Right;  . SHUNT REMOVAL Right 08/09/2018   Procedure: SHUNT REMOVAL With Placement of Ventricular Catheter;  Surgeon: Lisbeth Renshaw, MD;  Location: [facility] OR;  Service: Neurosurgery;  Laterality: Right;  . SHUNT REVISION Right 08/05/2018   Procedure: SHUNT REVISION;  Surgeon: Julio Sicks, MD;  Location: [facility] OR;  Service:  Neurosurgery;  Laterality: Right;  . SHUNT REVISION VENTRICULAR-PERITONEAL Left 08/26/2018   Procedure: SHUNT REVISION VENTRICULAR-PERITONEAL;  Surgeon: Julio Sicks, MD;  Location: [facility] OR;  Service: Neurosurgery;  Laterality: Left;  . SHUNT REVISION VENTRICULAR-PERITONEAL Left 09/02/2018   Procedure: Left Occipital VP shunt revision;  Surgeon: Julio Sicks, MD;  Location: [facility] OR;  Service: Neurosurgery;  Laterality: Left;  [name] VASECTOMY  10/02/1997  . 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 VENTRICULOPERITONEAL SHUNT Right 07/22/2018   Procedure: Shunt Placment right occipital;  Surgeon: Julio Sicks, MD;  Location: [facility] OR;  Service: Neurosurgery;  Laterality: Right;  . VENTRICULOPERITONEAL SHUNT Left 08/26/2018   Procedure: LEFT SIDED VENTRICULAR-PERITONEAL SHUNT;  Surgeon: Julio Sicks, MD;  Location: [facility] OR;  Service: Neurosurgery;  Laterality: Left;  [name] VENTRICULOSTOMY Right 08/09/2018   Procedure: VENTRICULOSTOMY;  Surgeon: Lisbeth Renshaw, MD;  Location: [facility] OR;  Service: Neurosurgery;  Laterality: Right;    There were no vitals filed for this visit.  Subjective Assessment - 10/08/18 1533    Subjective  Cooked an egg tortilla with OT.  Headaches have been gone for two days and has slept better.  More conversational today.      Patient is accompained by:  Family member  Wife Charles Marquez and daughter Charles Marquez   Pertinent History   S/p VP shunt, Hydrocephalus, CVA with loop recorder insertion 04/10/2017 as well as R occipital VP shunt placement 2016, Infection of ventricular shunt, shunt malfunction, HLD, anxiety, TIA, Hypercholesteremia, acute blood loss anemia, HTN, Tachycardia, Leukocytosis, Hyponatremia, Bacterial Encephalitis, Bacterial Meningitis, Dyslipidemia, DDD lumbar spine, and Mycoplasma Pneumonia.      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  No/denies                            Balance Exercises - 10/08/18 1538      Balance Exercises: Standing   Tandem Gait  Forward;Foam/compliant surface;4 reps   over gravel and mulch   Sidestepping  Foam/compliant support;4 reps   with braiding to L/R over gravel and mulch   Marching Limitations  High knee slow marching x 4 laps down and back on compliant surfaces: gravel and mulch with min A to improve control and SLS time    Other Standing Exercises  Coordination training with basketball bounce and toss, dribble and pass combined with forwards and backwards walking, side stepping and carioca with supervision-min A        PT Education - 10/08/18 1647    Education Details  will upgrade HEP tomorrow    Person(s) Educated  Patient;Spouse    Methods  Explanation    Comprehension  Verbalized understanding       PT Short Term Goals - 09/19/18 1525      PT SHORT TERM GOAL #1   Title  Pt will participate in establishment of initial HEP to improve strength, balance, and functional mobility.     Time  4    Period  Weeks    Status  New    Target Date  10/19/18      PT SHORT TERM GOAL #2   Title  Pt will ambulate 500 feet outdoors navigating uneven terrain, curbs, ramps, and perform dual cognitive tasks with therapist providing supervision to improve safety with community distances.     Time  4    Period  Weeks    Status  New    Target Date  10/19/18      PT SHORT TERM GOAL #3   Title  Pt will perform 12 steps with reciprocal stepping technique and single HR demonstrating improvement in safety with community accessibility with therapist providing supervision.     Time  4    Period  Weeks    Status  New    Target Date  10/19/18      PT SHORT TERM GOAL #4   Title   Pt will report no LOB episodes when walking over his gravel drive way indicating improvement in safety with functional mobility at home.    Time  4    Period  Weeks    Status  New    Target Date  10/19/18      PT SHORT TERM GOAL #5   Title  Pt will improve FGA score to >/= 20/30 indicating improvement in functional mobility.     Baseline  11/7: 16/30 indicating high fall risk potential     Time  4    Period  Weeks    Status  New    Target Date  10/19/18  PT Long Term Goals - 09/19/18 1530      PT LONG TERM GOAL #1   Title  Pt will be independent and compliant with performing is HEP to improve balance, strength, and functional mobility.    Time  8    Period  Weeks    Status  New    Target Date  11/18/18      PT LONG TERM GOAL #2   Title  Pt will ambulate 1000 feet outdoors at mod I level navigating uneven terrain, curbs, ramps, and perform dual cognitive tasks to increase safety with community distances.     Time  8    Period  Weeks    Status  New    Target Date  11/18/18      PT LONG TERM GOAL #3   Title  Pt will demonstrate 12 steps with no HR's at mod I level with good safety awareness to improve community accessibility.     Time  8    Period  Weeks    Status  New    Target Date  11/18/18      PT LONG TERM GOAL #4   Title  Pt will improve FGA score to >/=25/30 indicating improvement in functional mobility.     Time  8    Period  Weeks    Status  New    Target Date  11/18/18            Plan - 10/08/18 1648    Clinical Impression Statement  Continued higher level coordination and balance training with dual task indoors with basketball drills and then outdoors over compliant gravel and mulch.  Pt continues to require verbal and tactile cues for sequencing and motor control as pt continues to utilize momentum to compensate for decreased balance.  Pt is continuing to show improvements in balance and will benefit from upgrade of HEP tomorrow.    Rehab  Potential  Good    Clinical Impairments Affecting Rehab Potential  Pt has short term memory deficits and cognitive changes from baseline which may negatively influence his ability to recall exercises and progressions in therapy.     PT Frequency  2x / week    PT Duration  8 weeks    PT Treatment/Interventions  ADLs/Self Care Home Management;Therapeutic activities;Dry needling;Therapeutic exercise;Balance training;Neuromuscular re-education;Manual techniques;Visual/perceptual remediation/compensation;Patient/family education;Stair training;Gait training;Electrical Stimulation;Functional mobility training;Passive range of motion;DME Instruction    PT Next Visit Plan  upgrade HEP: walking resisted march, SLS, more LE strengthening.  ASSESS BP, dual tasking, increasing gait speed forwards/backwards/sudden stops/head turns to simulate officiating basketball.  high level balance and strengthening in quadruped/ tall kneeling.  Avoid activities where he is bending forwards/head down    PT Home Exercise Plan  [email or web address]     Consulted and Agree with Plan of Care  Patient;Family member/caregiver    Family Member Consulted  wife- kim        Patient will benefit from skilled therapeutic intervention in order to improve the following deficits and impairments:  Abnormal gait, Decreased coordination, Decreased safety awareness, Impaired vision/preception, Decreased balance, Decreased cognition, Decreased mobility, Decreased strength  Visit Diagnosis: Muscle weakness (generalized)  Unsteadiness on feet  Other abnormalities of gait and mobility     Problem List Patient Active Problem List   Diagnosis Date Noted  . Visual disturbance   . Slow transit constipation   . Hypoalbuminemia due to protein-calorie malnutrition (HCC)   . Acute blood loss anemia   . S/P VP  shunt   . Hydrocephalus (HCC) 08/30/2018  . Dyslipidemia   . History of CVA (cerebrovascular accident)   . Benign essential HTN   .  Tachycardia   . Leukocytosis   . Hyponatremia   . Hypokalemia   . 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 Bacterial meningitis 08/09/2018  . TIA (transient ischemic attack) 01/29/2017  . Acute encephalopathy   . Shunt malfunction 03/13/2016  . 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 Hyperlipidemia 10/20/2014  . Degenerative disc disease, lumbar 04/15/2013  . Routine general medical examination at a health care facility 07/23/2012  . DISTURBANCE OF SKIN SENSATION 10/05/2008  . HYPERLIPIDEMIA 01/01/2008  . MYCOPLASMA PNEUMONIA 01/01/2008    Dierdre HighmanAudra F Potter, PT, DPT 10/08/18    4:51 PM    [facility] [address] [place], [place], [address] Phone: [phone]   Fax:  [phone]  Name: Charles Marquez MRN: [id number] Date of Birth: [birth year]

## 2018-10-09 ENCOUNTER — Ambulatory Visit: Payer: BLUE CROSS/BLUE SHIELD | Admitting: Physical Therapy

## 2018-10-09 ENCOUNTER — Encounter: Payer: Self-pay | Admitting: Physical Therapy

## 2018-10-09 DIAGNOSIS — R2681 Unsteadiness on feet: Secondary | ICD-10-CM

## 2018-10-09 DIAGNOSIS — R2689 Other abnormalities of gait and mobility: Secondary | ICD-10-CM

## 2018-10-09 DIAGNOSIS — M6281 Muscle weakness (generalized): Secondary | ICD-10-CM | POA: Diagnosis not present

## 2018-10-09 NOTE — Therapy (Signed)
[facility] [address] [place], [place], [address] Phone: [phone]   Fax:  [phone]  Physical Therapy Treatment  Patient Details  Name: Charles Marquez MRN: [id number] Date of Birth: [birth year] Referring Provider (PT): Dr. Allena Katz    Encounter Date: 10/09/2018  PT End of Session - 10/09/18 1111    Visit Number  7    Number of Visits  17    Date for PT Re-Evaluation  11/18/18    Authorization Type  BCBS OTHER     Authorization Time Period  PT/OT/ST/Hydro therapy covered x 90 days    PT Start Time  1020    PT Stop Time  1100    PT Time Calculation (min)  40 min    Activity Tolerance  Patient tolerated treatment well    Behavior During Therapy  [facility] for tasks assessed/performed       Past Medical History:  Diagnosis Date  . Anxiety   . Hypercholesteremia   . Stroke (HCC)    tia's  . TIA (transient ischemic attack)    09.15    Past Surgical History:  Procedure Laterality Date  . Fractured arm Left 12  . HERNIA REPAIR Right 3/12  . LAPAROSCOPIC REVISION VENTRICULAR-PERITONEAL (V-P) SHUNT N/A 08/26/2018   Procedure: LAPAROSCOPIC INSERTION VENTRICULAR-PERITONEAL (V-P) SHUNT;  Surgeon: Julio Sicks, MD;  Location: [facility] OR;  Service: Neurosurgery;  Laterality: N/A;  . LOOP RECORDER INSERTION N/A 04/10/2017   Procedure: Loop Recorder Insertion;  Surgeon: Hillis Range, MD;  [facility]: [facility] INVASIVE CV LAB;  Service: Cardiovascular;  Laterality: N/A;  . SHUNT REMOVAL Right 03/13/2016   Procedure: SHUNT REMOVAL;  Surgeon: Julio Sicks, MD;  Location: [facility] NEURO ORS;  Service: Neurosurgery;  Laterality: Right;  . SHUNT REMOVAL Right 08/09/2018   Procedure: SHUNT REMOVAL With Placement of Ventricular Catheter;  Surgeon: Lisbeth Renshaw, MD;  Location: [facility] OR;  Service: Neurosurgery;  Laterality: Right;  . SHUNT REVISION Right 08/05/2018   Procedure: SHUNT REVISION;  Surgeon: Julio Sicks, MD;  Location: [facility] OR;  Service:  Neurosurgery;  Laterality: Right;  . SHUNT REVISION VENTRICULAR-PERITONEAL Left 08/26/2018   Procedure: SHUNT REVISION VENTRICULAR-PERITONEAL;  Surgeon: Julio Sicks, MD;  Location: [facility] OR;  Service: Neurosurgery;  Laterality: Left;  . SHUNT REVISION VENTRICULAR-PERITONEAL Left 09/02/2018   Procedure: Left Occipital VP shunt revision;  Surgeon: Julio Sicks, MD;  Location: [facility] OR;  Service: Neurosurgery;  Laterality: Left;  [name] VASECTOMY  10/02/1997  . 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 VENTRICULOPERITONEAL SHUNT Right 07/22/2018   Procedure: Shunt Placment right occipital;  Surgeon: Julio Sicks, MD;  Location: [facility] OR;  Service: Neurosurgery;  Laterality: Right;  . VENTRICULOPERITONEAL SHUNT Left 08/26/2018   Procedure: LEFT SIDED VENTRICULAR-PERITONEAL SHUNT;  Surgeon: Julio Sicks, MD;  Location: [facility] OR;  Service: Neurosurgery;  Laterality: Left;  [name] VENTRICULOSTOMY Right 08/09/2018   Procedure: VENTRICULOSTOMY;  Surgeon: Lisbeth Renshaw, MD;  Location: [facility] OR;  Service: Neurosurgery;  Laterality: Right;    There were no vitals filed for this visit.  Subjective Assessment - 10/09/18 1023    Subjective  Just has PT today.  Was not too exhausted after yesterday.  Neighbor is here to see where pt does therapy.  Neighbor will be bringing pt when wife returns to work.  Patient is accompained by:  Family member   Wife [name] and daughter [name]   Pertinent History   S/p VP shunt, Hydrocephalus, CVA with loop recorder insertion 04/10/2017 as well as R occipital VP shunt placement 2016, Infection of ventricular shunt, shunt malfunction, HLD, anxiety, TIA, Hypercholesteremia, acute blood loss anemia, HTN, Tachycardia, Leukocytosis, Hyponatremia, Bacterial Encephalitis, Bacterial Meningitis, Dyslipidemia, DDD  lumbar spine, and Mycoplasma Pneumonia.     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  No/denies       Access Code: [email or web address]  URL: [email or web address]  Date: 10/09/2018  Prepared by: Bufford Lope   Exercises  Side Stepping with Resistance at Thighs and Ankles - 4 reps - 1 sets - 1x daily - 3x weekly  Marching with Resistance - 4 reps - 1x daily - 3x weekly  Backward Walking with Counter Support - 4 reps - 2 sets - 1x daily - 5x weekly  Romberg Stance with Head Rotation - 3 sets - 30 hold - 1x daily - 7x weekly  Romberg Stance with Head Nods - 3 sets - 30 hold - 1x daily - 7x weekly  Romberg Stance Eyes Closed on Foam Pad - 3 sets - 30 hold - 1x daily - 7x weekly  Tandem Stance with Head Rotation - 2 sets - 10 reps - 1x daily - 7x weekly  Standing Single Leg Stance with Counter Support - 3 sets - 10 SECONDS hold - 1x daily - 3x weekly  Walking Tandem Stance - 10 reps - 4 sets - 1x daily - 3x weekly  Half-Kneeling to Standing - 3 reps - 1x daily - 7x weekly            PT Education - 10/09/18 1111    Education Details  updated and reviewed HEP    Person(s) Educated  Patient;Other (comment);Spouse   neighbor   Methods  Explanation;Demonstration;Handout    Comprehension  Verbalized understanding;Returned demonstration       PT Short Term Goals - 09/19/18 1525      PT SHORT TERM GOAL #1   Title  Pt will participate in establishment of initial HEP to improve strength, balance, and functional mobility.     Time  4    Period  Weeks    Status  New    Target Date  10/19/18      PT SHORT TERM GOAL #2   Title  Pt will ambulate 500 feet outdoors navigating uneven terrain, curbs, ramps, and perform dual cognitive tasks with therapist providing supervision to improve safety with community distances.     Time  4     Period  Weeks    Status  New    Target Date  10/19/18      PT SHORT TERM GOAL #3   Title  Pt will perform 12 steps with reciprocal stepping technique and single HR demonstrating improvement in safety with community accessibility with therapist providing supervision.     Time  4    Period  Weeks    Status  New    Target Date  10/19/18      PT SHORT TERM GOAL #4   Title  Pt will report no LOB episodes when walking over his gravel drive way indicating improvement in safety with functional mobility at home.    Time  4  Period  Weeks    Status  New    Target Date  10/19/18      PT SHORT TERM GOAL #5   Title  Pt will improve FGA score to >/= 20/30 indicating improvement in functional mobility.     Baseline  11/7: 16/30 indicating high fall risk potential     Time  4    Period  Weeks    Status  New    Target Date  10/19/18        PT Long Term Goals - 09/19/18 1530      PT LONG TERM GOAL #1   Title  Pt will be independent and compliant with performing is HEP to improve balance, strength, and functional mobility.    Time  8    Period  Weeks    Status  New    Target Date  11/18/18      PT LONG TERM GOAL #2   Title  Pt will ambulate 1000 feet outdoors at mod I level navigating uneven terrain, curbs, ramps, and perform dual cognitive tasks to increase safety with community distances.     Time  8    Period  Weeks    Status  New    Target Date  11/18/18      PT LONG TERM GOAL #3   Title  Pt will demonstrate 12 steps with no HR's at mod I level with good safety awareness to improve community accessibility.     Time  8    Period  Weeks    Status  New    Target Date  11/18/18      PT LONG TERM GOAL #4   Title  Pt will improve FGA score to >/=25/30 indicating improvement in functional mobility.     Time  8    Period  Weeks    Status  New    Target Date  11/18/18            Plan - 10/09/18 1111    Clinical Impression Statement  Due to patient progress treatment  session focused on review of current HEP, removal or revision of less challenging exercises and addition of new exercises to address ongoing balance issues during SLS, narrow BOS, proximal stability and motor control of movement.  Pt tolerated well and return demonstrated all exercises.  Will have supervision of wife or neighbor when he performs.    Rehab Potential  Good    Clinical Impairments Affecting Rehab Potential  Pt has short term memory deficits and cognitive changes from baseline which may negatively influence his ability to recall exercises and progressions in therapy.     PT Frequency  2x / week    PT Duration  8 weeks    PT Treatment/Interventions  ADLs/Self Care Home Management;Therapeutic activities;Dry needling;Therapeutic exercise;Balance training;Neuromuscular re-education;Manual techniques;Visual/perceptual remediation/compensation;Patient/family education;Stair training;Gait training;Electrical Stimulation;Functional mobility training;Passive range of motion;DME Instruction    PT Next Visit Plan   ASSESS BP, dual tasking, increasing gait speed forwards/backwards/sudden stops/head turns to simulate officiating basketball.  high level balance and strengthening in quadruped/ tall kneeling.  Avoid activities where he is bending forwards/head down    PT Home Exercise Plan  [email or web address]     Consulted and Agree with Plan of Care  Patient;Family member/caregiver    Family Member Consulted  wife- kim        Patient will benefit from skilled therapeutic intervention in order to improve the following deficits and impairments:  Abnormal gait, Decreased coordination,  Decreased safety awareness, Impaired vision/preception, Decreased balance, Decreased cognition, Decreased mobility, Decreased strength  Visit Diagnosis: Muscle weakness (generalized)  Unsteadiness on feet  Other abnormalities of gait and mobility     Problem List Patient Active Problem List   Diagnosis Date Noted  . Visual  disturbance   . Slow transit constipation   . Hypoalbuminemia due to protein-calorie malnutrition (HCC)   . Acute blood loss anemia   . S/P VP shunt   . Hydrocephalus (HCC) 08/30/2018  . Dyslipidemia   . History of CVA (cerebrovascular accident)   . Benign essential HTN   . Tachycardia   . Leukocytosis   . Hyponatremia   . Hypokalemia   . 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 Bacterial meningitis 08/09/2018  . TIA (transient ischemic attack) 01/29/2017  . Acute encephalopathy   . Shunt malfunction 03/13/2016  . 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 Hyperlipidemia 10/20/2014  . Degenerative disc disease, lumbar 04/15/2013  . Routine general medical examination at a health care facility 07/23/2012  . DISTURBANCE OF SKIN SENSATION 10/05/2008  . HYPERLIPIDEMIA 01/01/2008  . MYCOPLASMA PNEUMONIA 01/01/2008    Dierdre HighmanAudra F Maycol Hoying, PT, DPT 10/09/18    11:15 AM    [facility] [address] [place], [place], [address] Phone: [phone]   Fax:  [phone]  Name: Charles Marquez MRN: [id number] Date of Birth: 21-Nov-1955

## 2018-10-09 NOTE — Patient Instructions (Addendum)
Access Code: [email or web address]  URL: [email or web address]  Date: 10/09/2018  Prepared by: Bufford LopeAudra Magda Muise   Exercises  Side Stepping with Resistance at Thighs and Ankles - 4 reps - 1 sets - 1x daily - 3x weekly  Marching with Resistance - 4 reps - 1x daily - 3x weekly  Backward Walking with Counter Support - 4 reps - 2 sets - 1x daily - 5x weekly  Romberg Stance with Head Rotation - 3 sets - 30 hold - 1x daily - 7x weekly  Romberg Stance with Head Nods - 3 sets - 30 hold - 1x daily - 7x weekly  Romberg Stance Eyes Closed on Foam Pad - 3 sets - 30 hold - 1x daily - 7x weekly  Tandem Stance with Head Rotation - 2 sets - 10 reps - 1x daily - 7x weekly  Standing Single Leg Stance with Counter Support - 3 sets - 10 SECONDS hold - 1x daily - 3x weekly  Walking Tandem Stance - 10 reps - 4 sets - 1x daily - 3x weekly  Half-Kneeling to Standing - 3 reps - 1x daily - 7x weekly

## 2018-10-15 ENCOUNTER — Ambulatory Visit: Payer: BLUE CROSS/BLUE SHIELD

## 2018-10-15 ENCOUNTER — Encounter: Payer: Self-pay | Admitting: Occupational Therapy

## 2018-10-15 ENCOUNTER — Ambulatory Visit: Payer: BLUE CROSS/BLUE SHIELD | Attending: Physical Medicine & Rehabilitation | Admitting: Occupational Therapy

## 2018-10-15 DIAGNOSIS — R41841 Cognitive communication deficit: Secondary | ICD-10-CM

## 2018-10-15 DIAGNOSIS — R41842 Visuospatial deficit: Secondary | ICD-10-CM | POA: Diagnosis not present

## 2018-10-15 DIAGNOSIS — R2689 Other abnormalities of gait and mobility: Secondary | ICD-10-CM | POA: Diagnosis present

## 2018-10-15 DIAGNOSIS — M6281 Muscle weakness (generalized): Secondary | ICD-10-CM

## 2018-10-15 DIAGNOSIS — R4701 Aphasia: Secondary | ICD-10-CM | POA: Diagnosis present

## 2018-10-15 DIAGNOSIS — R2681 Unsteadiness on feet: Secondary | ICD-10-CM

## 2018-10-15 DIAGNOSIS — R482 Apraxia: Secondary | ICD-10-CM

## 2018-10-15 DIAGNOSIS — I69918 Other symptoms and signs involving cognitive functions following unspecified cerebrovascular disease: Secondary | ICD-10-CM | POA: Diagnosis present

## 2018-10-15 NOTE — Therapy (Signed)
[facility] [address] [place], [place], [address] Phone: [phone]   Fax:  [phone]  Speech Language Pathology Treatment  Patient Details  Name: Charles Marquez MRN: [id number] Date of Birth: [birth year] Referring Provider (SLP): Dr. Allena Katz   Encounter Date: 10/15/2018  End of Session - 10/15/18 1559    Visit Number  6    Number of Visits  17    Date for SLP Re-Evaluation  11/18/18    Authorization Type  BCBS 90 visits for all disciplines     SLP Start Time  1450    SLP Stop Time   1530    SLP Time Calculation (min)  40 min    Activity Tolerance  Patient tolerated treatment well       Past Medical History:  Diagnosis Date  . Anxiety   . Hypercholesteremia   . Stroke (HCC)    tia's  . TIA (transient ischemic attack)    09.15    Past Surgical History:  Procedure Laterality Date  . Fractured arm Left 12  . HERNIA REPAIR Right 3/12  . LAPAROSCOPIC REVISION VENTRICULAR-PERITONEAL (V-P) SHUNT N/A 08/26/2018   Procedure: LAPAROSCOPIC INSERTION VENTRICULAR-PERITONEAL (V-P) SHUNT;  Surgeon: Julio Sicks, MD;  Location: [facility] OR;  Service: Neurosurgery;  Laterality: N/A;  . LOOP RECORDER INSERTION N/A 04/10/2017   Procedure: Loop Recorder Insertion;  Surgeon: Hillis Range, MD;  [facility]: [facility] INVASIVE CV LAB;  Service: Cardiovascular;  Laterality: N/A;  . SHUNT REMOVAL Right 03/13/2016   Procedure: SHUNT REMOVAL;  Surgeon: Julio Sicks, MD;  Location: [facility] NEURO ORS;  Service: Neurosurgery;  Laterality: Right;  . SHUNT REMOVAL Right 08/09/2018   Procedure: SHUNT REMOVAL With Placement of Ventricular Catheter;  Surgeon: Lisbeth Renshaw, MD;  Location: [facility] OR;  Service: Neurosurgery;  Laterality: Right;  . SHUNT REVISION Right 08/05/2018   Procedure: SHUNT REVISION;  Surgeon: Julio Sicks, MD;  Location: [facility] OR;  Service: Neurosurgery;  Laterality: Right;  . SHUNT REVISION VENTRICULAR-PERITONEAL Left 08/26/2018    Procedure: SHUNT REVISION VENTRICULAR-PERITONEAL;  Surgeon: Julio Sicks, MD;  Location: [facility] OR;  Service: Neurosurgery;  Laterality: Left;  . SHUNT REVISION VENTRICULAR-PERITONEAL Left 09/02/2018   Procedure: Left Occipital VP shunt revision;  Surgeon: Julio Sicks, MD;  Location: [facility] OR;  Service: Neurosurgery;  Laterality: Left;  [name] VASECTOMY  10/02/1997  . 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 VENTRICULOPERITONEAL SHUNT Right 07/22/2018   Procedure: Shunt Placment right occipital;  Surgeon: Julio Sicks, MD;  Location: [facility] OR;  Service: Neurosurgery;  Laterality: Right;  . VENTRICULOPERITONEAL SHUNT Left 08/26/2018   Procedure: LEFT SIDED VENTRICULAR-PERITONEAL SHUNT;  Surgeon: Julio Sicks, MD;  Location: [facility] OR;  Service: Neurosurgery;  Laterality: Left;  [name] VENTRICULOSTOMY Right 08/09/2018   Procedure: VENTRICULOSTOMY;  Surgeon: Lisbeth Renshaw, MD;  Location: [facility] OR;  Service: Neurosurgery;  Laterality: Right;    There were no vitals filed for this visit.  Subjective Assessment - 10/15/18 1456    Patient is accompained by:  Family member   wife           ADULT SLP TREATMENT - 10/15/18 1601      General Information   Behavior/Cognition  Cooperative;Alert;Pleasant mood      Treatment Provided   Treatment provided  Cognitive-Linquistic  Cognitive-Linquistic Treatment   Treatment focused on  Cognition    Skilled Treatment  [name] carried pt's memory notebook/binder into session. Pt oriented to date with self correction, once seeing the date he picked in his memory notebook had a line through it. SLP educated pt/wife re: memory strategies and additions to [employer] ("to-do"/reminders, and journal) that pt has already started. Wife stated pt was taking greater ownership of  the binder than in previous weeks. SLP provided examples of memory strategies (write down, associate, repeat, mental picture) for pt, however pt recalled 2/4 after 10 minutes, 4/4 with mod-max A. [name] asked SLP for more divergent and convergent naming tasks for homework so SLP provided to wife.       Assessment / Recommendations / Plan   Plan  Continue with current plan of care      Progression Toward Goals   Progression toward goals  Progressing toward goals       SLP Education - 10/15/18 1549    Education Details  memory strategies, how to use memory notebook (add journal and reminders/to-do's)    Person(s) Educated  Patient;Spouse    Methods  Explanation    Comprehension  Verbalized understanding;Need further instruction       SLP Short Term Goals - 10/15/18 1601      SLP SHORT TERM GOAL #1   Title  Pt will complete standardized assessment of cognition.    Status  Achieved      SLP SHORT TERM GOAL #2   Title  Pt will ID object/picture to simple description/feature/function f:4 with occasional min A over 3 sessions     Baseline  10/01/18; 10/03/18    Time  2    Period  Weeks    Status  On-going      SLP SHORT TERM GOAL #3   Title  Pt will follow 2-step verbal directions with occasional min A 80% accuracy over 3 sessions     Baseline  10/03/18;    Time  2    Period  Weeks    Status  On-going      SLP SHORT TERM GOAL #4   Title  Pt will name basic objects/pictures 7/10 correct with occasional mod A over 3 sessions     Baseline  10/03/18    Time  2    Period  Weeks    Status  On-going      SLP SHORT TERM GOAL #5   Title  Pt will demo sustained attention for 10 minutes in simple cognitive-linguistic task x3 sessions.    Time  2    Period  Weeks    Status  On-going      SLP SHORT TERM GOAL #6   Title  Pt will have a memory system to assist in managing appointments, schedules, medical and therapy information and bring with him to 3 therapy sessions     Time  2     Period  Weeks    Status  On-going       SLP Long Term Goals - 10/15/18 1612      SLP LONG TERM GOAL #1   Title  Pt will write or name 5 items in a category with occasional min A over 4 sessions     Time  6    Period  Weeks   or 17 visits, for all LTGs   Status  On-going      SLP LONG TERM GOAL #2   Title  Pt will demo error awareness by attempting correction or by nonverbal response to errors 75% of the time over 3 sessions    Time  6    Period  Weeks    Status  On-going      SLP LONG TERM GOAL #3   Title  Pt will participate functionally in 10 minutes simple-mod complex conversation with conversational supports for aphasia over 3 sessions.    Time  6    Period  Weeks      SLP LONG TERM GOAL #4   Title  Pt will demo selective attention in min noisy environment for 10 minutes in a simple-mod complex cognitive linguistic task over three sessions    Time  6    Period  Weeks    Status  On-going      SLP LONG TERM GOAL #5   Title  Pt will utilize memory compensation system to recall details/manage appointments, schedules, medical and therapy information with rare min A over 4 sessions      Time  6    Period  Weeks    Status  On-going       Plan - 10/15/18 1600    Clinical Impression Statement  Mr. 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 SLP educated pt/wife on memory strategies (see pt instructions). Pt continues to exhibit anxiety and some frustration with errors. Continue skilled ST to maximize communication and cognition for wants/needs, safety, independence and QOL.    Speech Therapy Frequency  2x / week    Duration  --   8 weeks or 17 visits   Treatment/Interventions  Cognitive reorganization;Multimodal communcation approach;Environmental controls;Compensatory strategies;Language facilitation;Compensatory techniques;Cueing hierarchy;Internal/external aids;Functional tasks;SLP instruction and  feedback;Patient/family education    Potential to Achieve Goals  Good    Potential Considerations  Severity of impairments       Patient will benefit from skilled therapeutic intervention in order to improve the following deficits and impairments:   Aphasia  Cognitive communication deficit    Problem List Patient Active Problem List   Diagnosis Date Noted  . Visual disturbance   . Slow transit constipation   . Hypoalbuminemia due to protein-calorie malnutrition (HCC)   . Acute blood loss anemia   . S/P VP shunt   . Hydrocephalus (HCC) 08/30/2018  . Dyslipidemia   . History of CVA (cerebrovascular accident)   . Benign essential HTN   . Tachycardia   . Leukocytosis   . Hyponatremia   . Hypokalemia   . 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 Bacterial meningitis 08/09/2018  . TIA (transient ischemic attack) 01/29/2017  . Acute encephalopathy   . Shunt malfunction 03/13/2016  . 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 Hyperlipidemia 10/20/2014  . Degenerative disc disease, lumbar 04/15/2013  . Routine general medical examination at a health care facility 07/23/2012  . DISTURBANCE OF SKIN SENSATION 10/05/2008  . HYPERLIPIDEMIA 01/01/2008  . MYCOPLASMA PNEUMONIA 01/01/2008    [facility] Endoscopy CenterCHINKE, ,MS, CCC-SLP  10/15/2018, 4:13 PM  [facility] [address] [place], [place], [address] Phone: [phone]   Fax:  [phone]   Name: Celene SquibbKurt Edward Marquez MRN: [id number] Date of Birth: Apr 18, 1956

## 2018-10-15 NOTE — Patient Instructions (Addendum)
Memory Compensation Strategies  1. Use "WARM" strategy. W= write it down A=  associate it R=  repeat it M=  make a mental picture  2. You can keep a Glass blower/designerMemory Notebook. Use a 3-ring notebook with sections for the following:  calendar, important names and phone numbers, medications, doctors' names/phone numbers, "to do list"/reminders, and a section to journal what you did each day  3. Use a calendar to write appointments down.  4. Write yourself a schedule for the day.  This can be placed on the calendar or in a separate section of the Memory Notebook.  Keeping a regular schedule can help memory.  5. Use medication organizer with sections for each day or morning/evening pills  You may need help loading it  6. Keep a basket, or pegboard by the door.   Place items that you need to take out with you in the basket or on the pegboard.  You may also want to include a message board for reminders.  7. Use sticky notes. Place sticky notes with reminders in a place where the task is performed.  For example:  "turn off the stove" placed by the stove, "lock the door" placed on the door at eye level, "take your medications" on the bathroom mirror or by the place where you normally take your medications  8. Use alarms/timers.  Use while cooking to remind yourself to check on food or as a reminder to take your medicine, or as a reminder to make a call, or as a reminder to perform another task, etc.  9. Use your phone or a small tape recorder to record important information and notes for yourself.     Please complete the assigned speech therapy homework prior to your next session and return it to the speech therapist at your next visit.

## 2018-10-15 NOTE — Therapy (Signed)
[facility] [address] [place], [place], [address] Phone: [phone]   Fax:  [phone]  Occupational Therapy Treatment  Patient Details  Name: Charles Marquez MRN: [id number] Date of Birth: 12-27-1955 Referring Provider (OT): Dr. Jamse Arn   Encounter Date: 10/15/2018  OT End of Session - 10/15/18 1623    Visit Number  8    Number of Visits  9    Date for OT Re-Evaluation  10/17/18    Authorization Type  BCBS 90 visits for all disciplines    OT Start Time  1544    OT Stop Time  1612    OT Time Calculation (min)  28 min    Activity Tolerance  Patient tolerated treatment well    Behavior During Therapy  [facility] for tasks assessed/performed       Past Medical History:  Diagnosis Date  . Anxiety   . Hypercholesteremia   . Stroke ([place])    tia's  . TIA (transient ischemic attack)    09.15    Past Surgical History:  Procedure Laterality Date  . Fractured arm Left 12  . HERNIA REPAIR Right 3/12  . LAPAROSCOPIC REVISION VENTRICULAR-PERITONEAL (V-P) SHUNT N/A 08/26/2018   Procedure: LAPAROSCOPIC INSERTION VENTRICULAR-PERITONEAL (V-P) SHUNT;  Surgeon: Earnie Larsson, MD;  Location: [place];  Service: Neurosurgery;  Laterality: N/A;  . LOOP RECORDER INSERTION N/A 04/10/2017   Procedure: Loop Recorder Insertion;  Surgeon: Thompson Grayer, MD;  Location: [place] CV LAB;  Service: Cardiovascular;  Laterality: N/A;  . SHUNT REMOVAL Right 03/13/2016   Procedure: SHUNT REMOVAL;  Surgeon: Earnie Larsson, MD;  Location: [facility] NEURO ORS;  Service: Neurosurgery;  Laterality: Right;  . SHUNT REMOVAL Right 08/09/2018   Procedure: SHUNT REMOVAL With Placement of Ventricular Catheter;  Surgeon: Consuella Lose, MD;  Location: [place];  Service: Neurosurgery;  Laterality: Right;  . SHUNT REVISION Right 08/05/2018   Procedure: SHUNT REVISION;  Surgeon: Earnie Larsson, MD;  Location: [place];  Service: Neurosurgery;  Laterality: Right;  .  SHUNT REVISION VENTRICULAR-PERITONEAL Left 08/26/2018   Procedure: SHUNT REVISION VENTRICULAR-PERITONEAL;  Surgeon: Earnie Larsson, MD;  Location: [place];  Service: Neurosurgery;  Laterality: Left;  . SHUNT REVISION VENTRICULAR-PERITONEAL Left 09/02/2018   Procedure: Left Occipital VP shunt revision;  Surgeon: Earnie Larsson, MD;  Location: [place];  Service: Neurosurgery;  Laterality: Left;  [name] VASECTOMY  10/02/1997  . VENTRICULOPERITONEAL SHUNT Right 12/18/2014   Procedure: Shunt Placment - right occipital VP shunt ;  Surgeon: Charlie Pitter, MD;  Location: [place] NEURO ORS;  Service: Neurosurgery;  Laterality: Right;  Shunt Placment - right occipital VP shunt   . VENTRICULOPERITONEAL SHUNT Right 07/22/2018   Procedure: Shunt Placment right occipital;  Surgeon: Earnie Larsson, MD;  Location: [place];  Service: Neurosurgery;  Laterality: Right;  . VENTRICULOPERITONEAL SHUNT Left 08/26/2018   Procedure: LEFT SIDED VENTRICULAR-PERITONEAL SHUNT;  Surgeon: Earnie Larsson, MD;  Location: [place];  Service: Neurosurgery;  Laterality: Left;  [name] VENTRICULOSTOMY Right 08/09/2018   Procedure: VENTRICULOSTOMY;  Surgeon: Consuella Lose, MD;  Location: [place];  Service: Neurosurgery;  Laterality: Right;    There were no vitals filed for this visit.  Subjective Assessment - 10/15/18 1549    Subjective   Headache appears gone during the day    Patient is accompained by:  Family member    Pertinent History  S/p multiple VP shunt revisions, shunt infection, hydrocephalus    Patient Stated Goals  well you know - I  want this better (pt is aphasic)    Currently in Pain?  No/denies    Pain Score  0-No pain                   OT Treatments/Exercises (OP) - 10/15/18 0001      ADLs   Cooking  Patient and wife report that patient is doing a good deal of the cooking again.  Wife has returned to work, and patient's daughter is providing supervision - initially fairly close - working from patient's home, with the intent to back  off support as able.      Home Maintenance  Patient has returned to [place], etc.      Writing  Reviewed remaining long term goal of copying a sentence - with emphaisis on legibility, and accuracy.  Goal net, and patient and wife agreeable to OT discharge.  Discussed continuing to increase screen time - as tolerated.  Recommended timing self and stopping well before eye strain or headache - and intentionally increasing time.               OT Education - 10/15/18 1623    Education Details  graduated program to increase scxreen time / tolerance    Person(s) Educated  Patient;Spouse    Methods  Explanation    Comprehension  Verbalized understanding       OT Short Term Goals - 09/19/18 1653      OT SHORT TERM GOAL #1   Title  --        OT Long Term Goals - 10/15/18 1616      OT LONG TERM GOAL #1   Title  Pt and family will be mod I with home activities program to address self care and cognition via functional tasks and structured schedule - 10/17/2108    Status  Achieved      OT LONG TERM GOAL #2   Title  Pt will be mod I with basic ADL tasks     Status  Achieved      OT LONG TERM GOAL #3   Title  Pt will demonstrate sufficient attention to attend to familiar functional task in moderately busy environment.    Status  Achieved      OT LONG TERM GOAL #4   Title  Pt will be mod I with sequencing for basic self care tasks    Status  Achieved      OT LONG TERM GOAL #5   Title  Pt will be supervision for simple, familiar home mgmt tasks    Status  Achieved      OT LONG TERM GOAL #6   Title  Pt will be able to copy simple sentence with at least 90% accuracy    Status  Achieved            Plan - 10/15/18 1623    Clinical Impression Statement  Patient has met and exceeded his OT goals.  He is ready and agreeable to OT discharge.      Occupational Profile and client history currently impacting functional performance  PMH: multiple shunt revisions,  anxiety, TIA, LUE fx, communicating hydrocelphalus.     Occupational performance deficits (Please refer to evaluation for details):  ADL's;IADL's;Rest and Sleep;Work;Leisure;Social Participation    Rehab Potential  Good    OT Frequency  2x / week    OT Duration  4 weeks    OT Treatment/Interventions  Self-care/ADL training;Neuromuscular education;Therapist, nutritional;Therapeutic activities;Cognitive remediation/compensation;Visual/perceptual  remediation/compensation;Patient/family education;Balance training    Plan  discharge OT    Clinical Decision Making  Several treatment options, min-mod task modification necessary    Consulted and Agree with Plan of Care  Patient;Family member/caregiver    Family Member Consulted  wife       Patient will benefit from skilled therapeutic intervention in order to improve the following deficits and impairments:  Decreased balance, Decreased cognition, Decreased safety awareness, Decreased mobility, Impaired vision/preception  Visit Diagnosis: Visuospatial deficit  Apraxia  Other symptoms and signs involving cognitive functions following unspecified cerebrovascular disease  Unsteadiness on feet  Muscle weakness (generalized)    Problem List Patient Active Problem List   Diagnosis Date Noted  . Visual disturbance   . Slow transit constipation   . Hypoalbuminemia due to protein-calorie malnutrition ([place])   . Acute blood loss anemia   . S/P VP shunt   . Hydrocephalus ([place]) 08/30/2018  . Dyslipidemia   . History of CVA (cerebrovascular accident)   . Benign essential HTN   . Tachycardia   . Leukocytosis   . Hyponatremia   . Hypokalemia   . 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 Infection of ventricular shunt ([place]) 08/09/2018  . Bacterial meningitis 08/09/2018  . TIA (transient ischemic attack) 01/29/2017  . Acute encephalopathy   . Shunt malfunction 03/13/2016  . 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 Communicating  hydrocephalus ([place]) 12/18/2014  . Hyperlipidemia 10/20/2014  . Degenerative disc disease, lumbar 04/15/2013  . Routine general medical examination at a health care facility 07/23/2012  . DISTURBANCE OF SKIN SENSATION 10/05/2008  . HYPERLIPIDEMIA 01/01/2008  . MYCOPLASMA PNEUMONIA 01/01/2008    OCCUPATIONAL THERAPY DISCHARGE SUMMARY  Visits from Start of Care: 8  Current functional level related to goals / functional outcomes:   Patient has returned to independent performance of ADL and has returned to IADL - cooking, cleaning, yardwork.     Remaining deficits: Aphasia, visual field cut  Education / Equipment: Reading guide, visual compensation, HEP coordination Plan: Patient agrees to discharge.  Patient goals were met. Patient is being discharged due to meeting the stated rehab goals.  ?????         Mariah Milling , OTR/L 10/15/2018, 4:25 PM  [facility] [address] [place], [place], [address] Phone: [phone]   Fax:  [phone]  Name: Charles Marquez MRN: [id number] Date of Birth: 10-04-1956

## 2018-10-16 ENCOUNTER — Ambulatory Visit: Payer: BLUE CROSS/BLUE SHIELD | Admitting: Physical Therapy

## 2018-10-16 ENCOUNTER — Encounter: Payer: Self-pay | Admitting: Physical Therapy

## 2018-10-16 VITALS — BP 120/80

## 2018-10-16 DIAGNOSIS — R2681 Unsteadiness on feet: Secondary | ICD-10-CM

## 2018-10-16 DIAGNOSIS — R41842 Visuospatial deficit: Secondary | ICD-10-CM | POA: Diagnosis not present

## 2018-10-16 DIAGNOSIS — R2689 Other abnormalities of gait and mobility: Secondary | ICD-10-CM

## 2018-10-16 DIAGNOSIS — M6281 Muscle weakness (generalized): Secondary | ICD-10-CM

## 2018-10-16 NOTE — Therapy (Signed)
[facility] [address] [place], [place], [address] Phone: [phone]   Fax:  [phone]  Physical Therapy Treatment  Patient Details  Name: Charles Marquez MRN: [id number] Date of Birth: May 29, 1956 Referring Provider (PT): Dr. Allena KatzPatel    Encounter Date: 10/16/2018  PT End of Session - 10/16/18 1256    Visit Number  8    Number of Visits  17    Date for PT Re-Evaluation  11/18/18    Authorization Type  BCBS OTHER     Authorization Time Period  PT/OT/ST/Hydro therapy covered x 90 days    PT Start Time  1147    PT Stop Time  1230    PT Time Calculation (min)  43 min    Activity Tolerance  Patient tolerated treatment well    Behavior During Therapy  [facility] for tasks assessed/performed       Past Medical History:  Diagnosis Date  . Anxiety   . Hypercholesteremia   . Stroke (HCC)    tia's  . TIA (transient ischemic attack)    09.15    Past Surgical History:  Procedure Laterality Date  . Fractured arm Left 12  . HERNIA REPAIR Right 3/12  . LAPAROSCOPIC REVISION VENTRICULAR-PERITONEAL (V-P) SHUNT N/A 08/26/2018   Procedure: LAPAROSCOPIC INSERTION VENTRICULAR-PERITONEAL (V-P) SHUNT;  Surgeon: Julio SicksPool, Henry, MD;  Location: [facility] OR;  Service: Neurosurgery;  Laterality: N/A;  . LOOP RECORDER INSERTION N/A 04/10/2017   Procedure: Loop Recorder Insertion;  Surgeon: Hillis RangeAllred, James, MD;  Location: [facility] INVASIVE CV LAB;  Service: Cardiovascular;  Laterality: N/A;  . SHUNT REMOVAL Right 03/13/2016   Procedure: SHUNT REMOVAL;  Surgeon: Julio SicksHenry Pool, MD;  Location: [facility] NEURO ORS;  Service: Neurosurgery;  Laterality: Right;  . SHUNT REMOVAL Right 08/09/2018   Procedure: SHUNT REMOVAL With Placement of Ventricular Catheter;  Surgeon: Lisbeth RenshawNundkumar, Neelesh, MD;  [facility];  Service: Neurosurgery;  Laterality: Right;  . SHUNT REVISION Right 08/05/2018   Procedure: SHUNT REVISION;  Surgeon: Julio SicksPool, Henry, MD;  [facility];  Service:  Neurosurgery;  Laterality: Right;  . SHUNT REVISION VENTRICULAR-PERITONEAL Left 08/26/2018   Procedure: SHUNT REVISION VENTRICULAR-PERITONEAL;  Surgeon: Julio SicksPool, Henry, MD;  [facility];  Service: Neurosurgery;  Laterality: Left;  . SHUNT REVISION VENTRICULAR-PERITONEAL Left 09/02/2018   Procedure: Left Occipital VP shunt revision;  Surgeon: Julio SicksPool, Henry, MD;  [facility];  Service: Neurosurgery;  Laterality: Left;  [name]. VASECTOMY  10/02/1997  . VENTRICULOPERITONEAL SHUNT Right 12/18/2014   Procedure: Shunt Placment - right occipital VP shunt ;  Surgeon: Temple PaciniHenry A Pool, MD;  [facility]: [facility] NEURO ORS;  Service: Neurosurgery;  Laterality: Right;  Shunt Placment - right occipital VP shunt   . VENTRICULOPERITONEAL SHUNT Right 07/22/2018   Procedure: Shunt Placment right occipital;  Surgeon: Julio SicksPool, Henry, MD;  [facility];  Service: Neurosurgery;  Laterality: Right;  . VENTRICULOPERITONEAL SHUNT Left 08/26/2018   Procedure: LEFT SIDED VENTRICULAR-PERITONEAL SHUNT;  Surgeon: Julio SicksPool, Henry, MD;  [facility];  Service: Neurosurgery;  Laterality: Left;  [name]. VENTRICULOSTOMY Right 08/09/2018   Procedure: VENTRICULOSTOMY;  Surgeon: Lisbeth RenshawNundkumar, Neelesh, MD;  [facility];  Service: Neurosurgery;  Laterality: Right;    Vitals:   10/16/18 1156  BP: 120/80    Subjective Assessment - 10/16/18 1152    Subjective  Had Thanksgiving with kids on Saturday.  Wife is back at work, neighbor is now driving pt.  Pt reporting improvement in vision up until 5 minutes ago he began to experience  double vision.    Patient is accompained by:  Family member   Wife [name] and daughter [name]   Pertinent History   S/p VP shunt, Hydrocephalus, CVA with loop recorder insertion 04/10/2017 as well as R occipital VP shunt placement 2016, Infection of ventricular shunt, shunt malfunction, HLD, anxiety, TIA, Hypercholesteremia, acute blood loss anemia, HTN, Tachycardia, Leukocytosis, Hyponatremia, Bacterial Encephalitis, Bacterial  Meningitis, Dyslipidemia, DDD lumbar spine, and Mycoplasma Pneumonia.     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  No/denies                       [facility] Adult PT Treatment/Exercise - 10/16/18 1238      Transfers   Transfers  Sit to Stand;Stand to Sit    Sit to Stand  7: Independent    Stand to Sit  7: Independent      Ambulation/Gait   Ambulation/Gait  Yes    Ambulation/Gait Assistance  5: Supervision    Ambulation/Gait Assistance Details  outside over pavement and grass focusing on dual task (naming tasks) while performing forwards, retro, lateral stepping and high knee marching.  Mild veering and LOB in grass with cognitive task but pt able to self correct with supervision.  Also performed high knee marching and tandem gait in gravel x 3 sets of laps with min A for balance and verbal cues for more controlled movements and decreased use of momentum    Ambulation Distance (Feet)  500 Feet    Assistive device  None    Ambulation Surface  Outdoor;Paved;Gravel;Grass          Balance Exercises - 10/16/18 1246      Balance Exercises: Standing   Other Standing Exercises  Standing on red mat continued to focus on balance and SLS with alternating, forward single cone taps while side stepping to L and R.  Changed to forwards walking with single side tap to cone x 2 laps; retro walking with lateral cone taps x 2 laps with supervision-min A for balance         PT Education - 10/16/18 1256    Education Details  plan to check STG tomorrow    Person(s) Educated  Patient;Other (comment)   neighbor   Methods  Explanation    Comprehension  Verbalized understanding       PT Short Term Goals - 09/19/18 1525      PT SHORT TERM GOAL #1   Title  Pt will participate in establishment of initial HEP to improve strength,  balance, and functional mobility.     Time  4    Period  Weeks    Status  New    Target Date  10/19/18      PT SHORT TERM GOAL #2   Title  Pt will ambulate 500 feet outdoors navigating uneven terrain, curbs, ramps, and perform dual cognitive tasks with therapist providing supervision to improve safety with community distances.     Time  4    Period  Weeks    Status  New    Target Date  10/19/18      PT SHORT TERM GOAL #3   Title  Pt will perform 12 steps with reciprocal stepping technique and single HR demonstrating improvement in safety with community accessibility with therapist providing supervision.  Time  4    Period  Weeks    Status  New    Target Date  10/19/18      PT SHORT TERM GOAL #4   Title  Pt will report no LOB episodes when walking over his gravel drive way indicating improvement in safety with functional mobility at home.    Time  4    Period  Weeks    Status  New    Target Date  10/19/18      PT SHORT TERM GOAL #5   Title  Pt will improve FGA score to >/= 20/30 indicating improvement in functional mobility.     Baseline  11/7: 16/30 indicating high fall risk potential     Time  4    Period  Weeks    Status  New    Target Date  10/19/18        PT Long Term Goals - 09/19/18 1530      PT LONG TERM GOAL #1   Title  Pt will be independent and compliant with performing is HEP to improve balance, strength, and functional mobility.    Time  8    Period  Weeks    Status  New    Target Date  11/18/18      PT LONG TERM GOAL #2   Title  Pt will ambulate 1000 feet outdoors at mod I level navigating uneven terrain, curbs, ramps, and perform dual cognitive tasks to increase safety with community distances.     Time  8    Period  Weeks    Status  New    Target Date  11/18/18      PT LONG TERM GOAL #3   Title  Pt will demonstrate 12 steps with no HR's at mod I level with good safety awareness to improve community accessibility.     Time  8    Period  Weeks     Status  New    Target Date  11/18/18      PT LONG TERM GOAL #4   Title  Pt will improve FGA score to >/=25/30 indicating improvement in functional mobility.     Time  8    Period  Weeks    Status  New    Target Date  11/18/18            Plan - 10/16/18 1257    Clinical Impression Statement  Progressed gait training outside over variety of surface to also include dual tasking with cognitive tasks, pt tolerated well with only minor veering and LOB during dual task.  Continued balance training on gravel and with cone taps with improved control but continued to require intermittent min A to maintain balance when standing on LLE.    Rehab Potential  Good    Clinical Impairments Affecting Rehab Potential  Pt has short term memory deficits and cognitive changes from baseline which may negatively influence his ability to recall exercises and progressions in therapy.     PT Frequency  2x / week    PT Duration  8 weeks    PT Treatment/Interventions  ADLs/Self Care Home Management;Therapeutic activities;Dry needling;Therapeutic exercise;Balance training;Neuromuscular re-education;Manual techniques;Visual/perceptual remediation/compensation;Patient/family education;Stair training;Gait training;Electrical Stimulation;Functional mobility training;Passive range of motion;DME Instruction    PT Next Visit Plan  check STG!!  cone taps on mat, dual tasking, increasing gait speed forwards/backwards/sudden stops/head turns to simulate officiating basketball.  high level balance and strengthening in quadruped/ tall kneeling.  Avoid activities  where he is bending forwards/head down    PT Home Exercise Plan  [email or web address]     Consulted and Agree with Plan of Care  Patient;Family member/caregiver    Family Member Consulted  wife- kim        Patient will benefit from skilled therapeutic intervention in order to improve the following deficits and impairments:  Abnormal gait, Decreased coordination, Decreased  safety awareness, Impaired vision/preception, Decreased balance, Decreased cognition, Decreased mobility, Decreased strength  Visit Diagnosis: Muscle weakness (generalized)  Unsteadiness on feet  Other abnormalities of gait and mobility     Problem List Patient Active Problem List   Diagnosis Date Noted  . Visual disturbance   . Slow transit constipation   . Hypoalbuminemia due to protein-calorie malnutrition (HCC)   . Acute blood loss anemia   . S/P VP shunt   . Hydrocephalus (HCC) 08/30/2018  . Dyslipidemia   . History of CVA (cerebrovascular accident)   . Benign essential HTN   . Tachycardia   . Leukocytosis   . Hyponatremia   . Hypokalemia   . 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 Bacterial meningitis 08/09/2018  . TIA (transient ischemic attack) 01/29/2017  . Acute encephalopathy   . Shunt malfunction 03/13/2016  . 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 Hyperlipidemia 10/20/2014  . Degenerative disc disease, lumbar 04/15/2013  . Routine general medical examination at a health care facility 07/23/2012  . DISTURBANCE OF SKIN SENSATION 10/05/2008  . HYPERLIPIDEMIA 01/01/2008  . MYCOPLASMA PNEUMONIA 01/01/2008    Dierdre Highman, PT, DPT 10/16/18    1:01 PM    [facility] [address] [place], [place], [address] Phone: [phone]   Fax:  [phone]  Name: Charles Marquez MRN: [id number] Date of Birth: 29-Sep-1956

## 2018-10-17 ENCOUNTER — Ambulatory Visit: Payer: BLUE CROSS/BLUE SHIELD | Admitting: Occupational Therapy

## 2018-10-17 ENCOUNTER — Ambulatory Visit: Payer: BLUE CROSS/BLUE SHIELD | Admitting: Physical Therapy

## 2018-10-17 ENCOUNTER — Ambulatory Visit: Payer: BLUE CROSS/BLUE SHIELD | Admitting: Speech Pathology

## 2018-10-17 DIAGNOSIS — R4701 Aphasia: Secondary | ICD-10-CM

## 2018-10-17 DIAGNOSIS — R41842 Visuospatial deficit: Secondary | ICD-10-CM | POA: Diagnosis not present

## 2018-10-17 DIAGNOSIS — R41841 Cognitive communication deficit: Secondary | ICD-10-CM

## 2018-10-17 NOTE — Therapy (Signed)
[facility] [address] [place], [place], [address] Phone: [phone]   Fax:  [phone]  Speech Language Pathology Treatment  Patient Details  Name: Charles Marquez MRN: [id number] Date of Birth: 05-07-1956 Referring Provider (SLP): Dr. Allena Katz   Encounter Date: 10/17/2018  End of Session - 10/17/18 1521    Visit Number  7    Number of Visits  17    Date for SLP Re-Evaluation  11/18/18    Authorization Type  BCBS 90 visits for all disciplines     SLP Start Time  1401    SLP Stop Time   1449    SLP Time Calculation (min)  48 min    Activity Tolerance  Patient tolerated treatment well       Past Medical History:  Diagnosis Date  . Anxiety   . Hypercholesteremia   . Stroke (HCC)    tia's  . TIA (transient ischemic attack)    09.15    Past Surgical History:  Procedure Laterality Date  . Fractured arm Left 12  . HERNIA REPAIR Right 3/12  . LAPAROSCOPIC REVISION VENTRICULAR-PERITONEAL (V-P) SHUNT N/A 08/26/2018   Procedure: LAPAROSCOPIC INSERTION VENTRICULAR-PERITONEAL (V-P) SHUNT;  Surgeon: Julio Sicks, MD;  Location: [facility] OR;  Service: Neurosurgery;  Laterality: N/A;  . LOOP RECORDER INSERTION N/A 04/10/2017   Procedure: Loop Recorder Insertion;  Surgeon: Hillis Range, MD;  Location: [facility] INVASIVE CV LAB;  Service: Cardiovascular;  Laterality: N/A;  . SHUNT REMOVAL Right 03/13/2016   Procedure: SHUNT REMOVAL;  Surgeon: Julio Sicks, MD;  Location: [facility] NEURO ORS;  Service: Neurosurgery;  Laterality: Right;  . SHUNT REMOVAL Right 08/09/2018   Procedure: SHUNT REMOVAL With Placement of Ventricular Catheter;  Surgeon: Lisbeth Renshaw, MD;  [facility] OR;  Service: Neurosurgery;  Laterality: Right;  . SHUNT REVISION Right 08/05/2018   Procedure: SHUNT REVISION;  Surgeon: Julio Sicks, MD;  Location: [facility] OR;  Service: Neurosurgery;  Laterality: Right;  . SHUNT REVISION VENTRICULAR-PERITONEAL Left 08/26/2018   Procedure: SHUNT REVISION VENTRICULAR-PERITONEAL;  Surgeon: Julio Sicks, MD;  Location: [facility] OR;  Service: Neurosurgery;  Laterality: Left;  . SHUNT REVISION VENTRICULAR-PERITONEAL Left 09/02/2018   Procedure: Left Occipital VP shunt revision;  Surgeon: Julio Sicks, MD;  Location: [facility] OR;  Service: Neurosurgery;  Laterality: Left;  [name] VASECTOMY  10/02/1997  . 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 VENTRICULOPERITONEAL SHUNT Right 07/22/2018   Procedure: Shunt Placment right occipital;  Surgeon: Julio Sicks, MD;  Location: [facility] OR;  Service: Neurosurgery;  Laterality: Right;  . VENTRICULOPERITONEAL SHUNT Left 08/26/2018   Procedure: LEFT SIDED VENTRICULAR-PERITONEAL SHUNT;  Surgeon: Julio Sicks, MD;  Location: [facility] OR;  Service: Neurosurgery;  Laterality: Left;  [name] VENTRICULOSTOMY Right 08/09/2018   Procedure: VENTRICULOSTOMY;  Surgeon: Lisbeth Renshaw, MD;  Location: [facility] OR;  Service: Neurosurgery;  Laterality: Right;    There were no vitals filed for this visit.  Subjective Assessment - 10/17/18 1500    Subjective  "We like having you here whether you're supposed to be or not."    Patient is accompained by:  Family member   wife   Currently in Pain?  No/denies            ADULT SLP TREATMENT - 10/17/18 1401      General  Information   Behavior/Cognition  Cooperative;Alert;Pleasant mood      Treatment Provided   Treatment provided  Cognitive-Linquistic      Pain Assessment   Pain Assessment  No/denies pain      Cognitive-Linquistic Treatment   Treatment focused on  Aphasia;Cognition    Skilled Treatment  Pt entered ST room carrying memory notebook/binder. Initial conversation with SLP was fluent and appropriate; pt asked SLP about appointment change when he saw unfamiliar therapist on  Tuesday and responded appropriately to SLP's explanation (See "s"). Pt oriented to date without cues, though required min verbal cues from wife to locate his journal. Pt unaware of incorrect date in journal notes from previous day (wrote 08/16/18 instead of 10/16/18). In 10 minutes mod complex conversation, pt corrected several semantic and phonemic paraphasias; made attempts to do so approximately 85% of the time. When cued to do so, pt able to write approximations of his speech errors which was effective tool for error correction. Several instances of anomia, with pt initially looking to wife for assistance. Wife demo'd appropriate support, giving pt additional time or providing question cues/encouragement. Divergent naming with personally relevant words (pt's job skills, financial companies/banks); pt required encouragement and question cues to generate >4 items.       Assessment / Recommendations / Plan   Plan  Goals updated      Progression Toward Goals   Progression toward goals  Progressing toward goals         SLP Short Term Goals - 10/17/18 1507      SLP SHORT TERM GOAL #1   Title  Pt will complete standardized assessment of cognition.    Status  Achieved      SLP SHORT TERM GOAL #2   Title  Pt will ID object/picture to simple description/feature/function f:4 with occasional min A over 3 sessions     Baseline  10/01/18; 10/03/18    Time  2    Period  Weeks    Status  On-going      SLP SHORT TERM GOAL #3   Title  Pt will demo auditory comprehension of 5 minutes simple-mod complex conversation by responding appropriately or asking questions over 2 sessions    Baseline  10/17/18    Time  2    Period  Weeks    Status  Revised      SLP SHORT TERM GOAL #4   Title  Pt will name basic objects/pictures 7/10 correct with occasional mod A over 3 sessions     Baseline  10/03/18    Time  2    Period  Weeks    Status  On-going      SLP SHORT TERM GOAL #5   Title  Pt will demo  sustained attention for 10 minutes in simple cognitive-linguistic task x3 sessions.    Baseline  10/17/18 (conversation)    Time  2    Period  Weeks    Status  On-going      SLP SHORT TERM GOAL #6   Title  Pt will have a memory system to assist in managing appointments, schedules, medical and therapy information and bring with him to 3 therapy sessions    10/15/18, 10/17/18   Time  2    Period  Weeks    Status  On-going       SLP Long Term Goals - 10/17/18 1525      SLP LONG TERM GOAL #1   Title  Pt will write or  name 5 items in a category with occasional min A over 4 sessions     Time  5    Period  Weeks   or 17 visits for all LTGs   Status  On-going      SLP LONG TERM GOAL #2   Title  Pt will demo error awareness by attempting correction or by nonverbal response to errors 75% of the time over 3 sessions    Baseline  10/17/18    Time  6    Period  Weeks    Status  On-going      SLP LONG TERM GOAL #3   Title  Pt will participate functionally in 10 minutes simple-mod complex conversation with conversational supports for aphasia over 3 sessions.    Baseline  10/17/18    Time  6    Period  Weeks    Status  On-going      SLP LONG TERM GOAL #4   Title  Pt will demo selective attention in min noisy environment for 10 minutes in a simple-mod complex cognitive linguistic task over three sessions    Time  6    Period  Weeks    Status  On-going      SLP LONG TERM GOAL #5   Title  Pt will utilize memory compensation system to recall details/manage appointments, schedules, medical and therapy information with rare min A over 4 sessions      Time  6    Period  Weeks    Status  On-going       Plan - 10/17/18 1521    Clinical Impression Statement  Charles Marquez continues to present with aphasia, attention and memory impairments. Significant improvements in auditory comprehension and verbal expression since last seen by this SLP; aphasia appears today to be more mild-moderate. With mod  complex-complex conversation, pt continues with wordfinding difficulties and frequent paraphasias, of which his awareness is improving. Auditory comprehension goal has been revised to reflect pt's progress. Pt continues to exhibit anxiety and some frustration with errors. Continue skilled ST to maximize communication and cognition for wants/needs, safety, independence and QOL.    Speech Therapy Frequency  2x / week    Duration  --   8 weeks or 17 visits   Treatment/Interventions  Cognitive reorganization;Multimodal communcation approach;Environmental controls;Compensatory strategies;Language facilitation;Compensatory techniques;Cueing hierarchy;Internal/external aids;Functional tasks;SLP instruction and feedback;Patient/family education    Potential to Achieve Goals  Good    Potential Considerations  Severity of impairments       Patient will benefit from skilled therapeutic intervention in order to improve the following deficits and impairments:   Aphasia  Cognitive communication deficit    Problem List Patient Active Problem List   Diagnosis Date Noted  . Visual disturbance   . Slow transit constipation   . Hypoalbuminemia due to protein-calorie malnutrition (HCC)   . Acute blood loss anemia   . S/P VP shunt   . Hydrocephalus (HCC) 08/30/2018  . Dyslipidemia   . History of CVA (cerebrovascular accident)   . Benign essential HTN   . Tachycardia   . Leukocytosis   . Hyponatremia   . Hypokalemia   . 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 Bacterial meningitis 08/09/2018  . TIA (transient ischemic attack) 01/29/2017  . Acute encephalopathy   . Shunt malfunction 03/13/2016  . 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 Hyperlipidemia 10/20/2014  . Degenerative disc disease, lumbar 04/15/2013  .  Routine general medical examination at a health care facility 07/23/2012  .  DISTURBANCE OF SKIN SENSATION 10/05/2008  . HYPERLIPIDEMIA 01/01/2008  . MYCOPLASMA PNEUMONIA 01/01/2008   Rondel BatonMary Beth Ada Holness, MS, CCC-SLP Speech-Language Pathologist   Charles Marquez 10/17/2018, 3:30 PM  [facility] [address] [place], [place], [address] Phone: [phone]   Fax:  [phone]   Name: Charles Marquez MRN: [id number] Date of Birth: [birth year]

## 2018-10-17 NOTE — Addendum Note (Signed)
Addended by: Arlana LindauBARDIN, Khylon Davies E on: 10/17/2018 03:56 PM   Modules accepted: Orders

## 2018-10-18 ENCOUNTER — Ambulatory Visit: Payer: BLUE CROSS/BLUE SHIELD

## 2018-10-18 DIAGNOSIS — R2689 Other abnormalities of gait and mobility: Secondary | ICD-10-CM

## 2018-10-18 DIAGNOSIS — R41842 Visuospatial deficit: Secondary | ICD-10-CM | POA: Diagnosis not present

## 2018-10-18 DIAGNOSIS — R2681 Unsteadiness on feet: Secondary | ICD-10-CM

## 2018-10-18 NOTE — Patient Instructions (Signed)
Access Code: [email or web address]  URL: [email or web address]  Date: 10/18/2018  Prepared by: Zerita BoersJennifer Lainie Daubert   Exercises  Backward Walking with Counter Support - 4 reps - 2 sets - 1x daily - 2x weekly  Romberg Stance with Head Rotation - 3 sets - 30 hold - 1x daily - 7x weekly  Romberg Stance with Head Nods - 3 sets - 30 hold - 1x daily - 7x weekly  Romberg Stance Eyes Closed on Foam Pad - 3 sets - 30 hold - 1x daily - 7x weekly  Tandem Stance with Head Rotation - 2 sets - 10 reps - 1x daily - 7x weekly

## 2018-10-18 NOTE — Therapy (Addendum)
[facility] [address] [place], [place], [address] Phone: [phone]   Fax:  [phone]  Physical Therapy Treatment  Patient Details  Name: Charles Marquez MRN: [id number] Date of Birth: Jul 15, 1956 Referring Provider (PT): Dr. Posey Pronto    Encounter Date: 10/18/2018  PT End of Session - 10/18/18 0850    Visit Number  9    Number of Visits  17    Date for PT Re-Evaluation  11/18/18    Authorization Type  [employer] OTHER     Authorization Time Period  PT/OT/ST/Hydro therapy covered x 90 days    PT Start Time  0807    PT Stop Time  0847    PT Time Calculation (min)  40 min    Equipment Utilized During Treatment  Gait belt    Activity Tolerance  Patient tolerated treatment well    Behavior During Therapy  [facility] for tasks assessed/performed       Past Medical History:  Diagnosis Date  . Anxiety   . Hypercholesteremia   . Stroke ([place])    tia's  . TIA (transient ischemic attack)    09.15    Past Surgical History:  Procedure Laterality Date  . Fractured arm Left 12  . HERNIA REPAIR Right 3/12  . LAPAROSCOPIC REVISION VENTRICULAR-PERITONEAL (V-P) SHUNT N/A 08/26/2018   Procedure: LAPAROSCOPIC INSERTION VENTRICULAR-PERITONEAL (V-P) SHUNT;  Surgeon: Earnie Larsson, MD;  Location: [facility];  Service: Neurosurgery;  Laterality: N/A;  . LOOP RECORDER INSERTION N/A 04/10/2017   Procedure: Loop Recorder Insertion;  Surgeon: Thompson Grayer, MD;  Location: [facility];  Service: Cardiovascular;  Laterality: N/A;  . SHUNT REMOVAL Right 03/13/2016   Procedure: SHUNT REMOVAL;  Surgeon: Earnie Larsson, MD;  Location: [facility];  Service: Neurosurgery;  Laterality: Right;  . SHUNT REMOVAL Right 08/09/2018   Procedure: SHUNT REMOVAL With Placement of Ventricular Catheter;  Surgeon: Consuella Lose, MD;  Location: [facility];  Service: Neurosurgery;  Laterality: Right;  . SHUNT REVISION Right 08/05/2018   Procedure: SHUNT REVISION;  Surgeon:  Earnie Larsson, MD;  Location: [facility];  Service: Neurosurgery;  Laterality: Right;  . SHUNT REVISION VENTRICULAR-PERITONEAL Left 08/26/2018   Procedure: SHUNT REVISION VENTRICULAR-PERITONEAL;  Surgeon: Earnie Larsson, MD;  Location: [facility];  Service: Neurosurgery;  Laterality: Left;  . SHUNT REVISION VENTRICULAR-PERITONEAL Left 09/02/2018   Procedure: Left Occipital VP shunt revision;  Surgeon: Earnie Larsson, MD;  Location: [facility];  Service: Neurosurgery;  Laterality: Left;  [name] VASECTOMY  10/02/1997  . VENTRICULOPERITONEAL SHUNT Right 12/18/2014   Procedure: Shunt Placment - right occipital VP shunt ;  Surgeon: Charlie Pitter, MD;  Location: [facility];  Service: Neurosurgery;  Laterality: Right;  Shunt Placment - right occipital VP shunt   . VENTRICULOPERITONEAL SHUNT Right 07/22/2018   Procedure: Shunt Placment right occipital;  Surgeon: Earnie Larsson, MD;  Location: [facility];  Service: Neurosurgery;  Laterality: Right;  . VENTRICULOPERITONEAL SHUNT Left 08/26/2018   Procedure: LEFT SIDED VENTRICULAR-PERITONEAL SHUNT;  Surgeon: Earnie Larsson, MD;  Location: [facility] [place];  Service: Neurosurgery;  Laterality: Left;  [name] VENTRICULOSTOMY Right 08/09/2018   Procedure: VENTRICULOSTOMY;  Surgeon: Consuella Lose, MD;  Location: [facility];  Service: Neurosurgery;  Laterality: Right;    There were no vitals filed for this visit.  Subjective Assessment - 10/18/18 0809    Subjective  Pt denied falls or changes since last visit. Pt reported BP has been WNL.     Patient is accompained by:  --  neighbor: Joneen Boers   Pertinent History   S/p VP shunt, Hydrocephalus, CVA with loop recorder insertion 04/10/2017 as well as R occipital VP shunt placement 2016, Infection of ventricular shunt, shunt malfunction, HLD, anxiety, TIA, Hypercholesteremia, acute blood loss anemia, HTN, Tachycardia, Leukocytosis, Hyponatremia, Bacterial Encephalitis, Bacterial Meningitis, Dyslipidemia, DDD lumbar spine, and Mycoplasma Pneumonia.     Patient Stated Goals   "Id love to be able to run backwards again to [employer] basketball games and improve my memory."    Currently in Pain?  No/denies         [facility] PT Assessment - 10/18/18 0810      Functional Gait  Assessment   Gait assessed   Yes    Gait Level Surface  Walks 20 ft in less than 5.5 sec, no assistive devices, good speed, no evidence for imbalance, normal gait pattern, deviates no more than 6 in outside of the 12 in walkway width.   4.34 sec.    Change in Gait Speed  Able to smoothly change walking speed without loss of balance or gait deviation. Deviate no more than 6 in outside of the 12 in walkway width.    Gait with Horizontal Head Turns  Performs head turns smoothly with slight change in gait velocity (eg, minor disruption to smooth gait path), deviates 6-10 in outside 12 in walkway width, or uses an assistive device.    Gait with Vertical Head Turns  Performs task with slight change in gait velocity (eg, minor disruption to smooth gait path), deviates 6 - 10 in outside 12 in walkway width or uses assistive device    Gait and Pivot Turn  Pivot turns safely in greater than 3 sec and stops with no loss of balance, or pivot turns safely within 3 sec and stops with mild imbalance, requires small steps to catch balance.    Step Over Obstacle  Is able to step over 2 stacked shoe boxes taped together (9 in total height) without changing gait speed. No evidence of imbalance.    Gait with Narrow Base of Support  Ambulates 4-7 steps.   6 steps   Gait with Eyes Closed  Walks 20 ft, slow speed, abnormal gait pattern, evidence for imbalance, deviates 10-15 in outside 12 in walkway width. Requires more than 9 sec to ambulate 20 ft.   10.83 sec.    Ambulating Backwards  Walks 20 ft, uses assistive device, slower speed, mild gait deviations, deviates 6-10 in outside 12 in walkway width.    Steps  Alternating feet, no rail.    Total Score  22    FGA comment:  22/30: indicates pt is at a low risk for falls.          Neuro re-ed: Access Code: [email or web address]  URL: [email or web address]  Date: 10/18/2018  Prepared by: Geoffry Paradise   Exercises   Backward Walking with Counter Support - 4 reps - 2 sets - 1x daily - 2x weekly   Romberg Stance with Head Rotation - 3 sets - 30 hold - 1x daily - 7x weekly   Romberg Stance with Head Nods - 3 sets - 30 hold - 1x daily - 7x weekly   Romberg Stance Eyes Closed on Foam Pad - 3 sets - 30 hold - 1x daily - 7x weekly   Tandem Stance with Head Rotation - 2 sets - 10 reps - 1x daily - 7x weekly  Cues and demo for technique and progression.  [facility] Adult PT Treatment/Exercise - 10/18/18 0821      Ambulation/Gait   Ambulation/Gait  Yes    Ambulation/Gait Assistance  5: Supervision    Ambulation/Gait Assistance Details  Pt with occasional veering from straight trajectory.     Ambulation Distance (Feet)  500 Feet    Assistive device  None    Gait Pattern  Step-through pattern;Narrow base of support   intermittent narrow BOS   Ambulation Surface  Level;Unlevel;Indoor;Outdoor;Paved;Gravel;Other (comment)   rubber mulch   Stairs  Yes    Stairs Assistance  5: Supervision    Stairs Assistance Details (indicate cue type and reason)  Pt performed with S to ensure safety.    Stair Management Technique  No rails;Alternating pattern;One rail Right;Forwards    Number of Stairs  12    Height of Stairs  6    Ramp  5: Supervision    Ramp Details (indicate cue type and reason)  Cues to improve weight shifting.    Curb  5: Supervision    Curb Details (indicate cue type and reason)  S to ensure safety.             PT Education - 10/18/18 0849    Education Details  PT discussed STG progress and progressed balance HEP as tolerated. PT discussed performing remaining HEP over the weekend and PT will progress as tolerated next session.     Person(s) Educated  Patient;Other (comment)   pt's neighbor: Joneen Boers   Methods   Explanation;Demonstration;Verbal cues;Handout    Comprehension  Verbalized understanding;Returned demonstration       PT Short Term Goals - 10/18/18 0853      PT SHORT TERM GOAL #1   Title  Pt will participate in establishment of initial HEP to improve strength, balance, and functional mobility.     Time  4    Period  Weeks    Status  New      PT SHORT TERM GOAL #2   Title  Pt will ambulate 500 feet outdoors navigating uneven terrain, curbs, ramps, and perform dual cognitive tasks with therapist providing supervision to improve safety with community distances.     Time  4    Period  Weeks    Status  Achieved      PT SHORT TERM GOAL #3   Title  Pt will perform 12 steps with reciprocal stepping technique and single HR demonstrating improvement in safety with community accessibility with therapist providing supervision.     Time  4    Period  Weeks    Status  Achieved      PT SHORT TERM GOAL #4   Title  Pt will report no LOB episodes when walking over his gravel drive way indicating improvement in safety with functional mobility at home.    Time  4    Period  Weeks    Status  Achieved      PT SHORT TERM GOAL #5   Title  Pt will improve FGA score to >/= 20/30 indicating improvement in functional mobility.     Baseline  22/30 on 10/18/18    Time  4    Period  Weeks    Status  Achieved        PT Long Term Goals - 09/19/18 1530      PT LONG TERM GOAL #1   Title  Pt will be independent and compliant with performing is HEP to improve balance, strength, and functional mobility.    Time  8    Period  Weeks    Status  New    Target Date  11/18/18      PT LONG TERM GOAL #2   Title  Pt will ambulate 1000 feet outdoors at mod I level navigating uneven terrain, curbs, ramps, and perform dual cognitive tasks to increase safety with community distances.     Time  8    Period  Weeks    Status  New    Target Date  11/18/18      PT LONG TERM GOAL #3   Title  Pt will demonstrate 12  steps with no HR's at mod I level with good safety awareness to improve community accessibility.     Time  8    Period  Weeks    Status  New    Target Date  11/18/18      PT LONG TERM GOAL #4   Title  Pt will improve FGA score to >/=25/30 indicating improvement in functional mobility.     Time  8    Period  Weeks    Status  New    Target Date  11/18/18            Plan - 10/18/18 0834    Clinical Impression Statement  Pt demonstrated progress, as he met STGs 2-5. PT will finish assessing STG 1 (HEP) next session, but balance HEP is going well as pt able to tolerate progression. Pt continues to experience intermittent LOB during dynamic gait/balance activities which he self corrects with UE support of stepping strategy. Pt would continue to benefit from skilled PT to improve safety during functional mobility.     Rehab Potential  Good    Clinical Impairments Affecting Rehab Potential  Pt has short term memory deficits and cognitive changes from baseline which may negatively influence his ability to recall exercises and progressions in therapy.     PT Frequency  2x / week    PT Duration  8 weeks    PT Treatment/Interventions  ADLs/Self Care Home Management;Therapeutic activities;Dry needling;Therapeutic exercise;Balance training;Neuromuscular re-education;Manual techniques;Visual/perceptual remediation/compensation;Patient/family education;Stair training;Gait training;Electrical Stimulation;Functional mobility training;Passive range of motion;DME Instruction    PT Next Visit Plan  Finish assessing STG 1 (HEP) and progress as tolerated. PN next visit.  cone taps on mat, dual tasking, increasing gait speed forwards/backwards/sudden stops/head turns to simulate officiating basketball.  high level balance and strengthening in quadruped/ tall kneeling.  Avoid activities where he is bending forwards/head down    PT Home Exercise Plan  [email or web address]     Consulted and Agree with Plan of Care   Patient;Family member/caregiver    Family Member Consulted  wife- kim        Patient will benefit from skilled therapeutic intervention in order to improve the following deficits and impairments:  Abnormal gait, Decreased coordination, Decreased safety awareness, Impaired vision/preception, Decreased balance, Decreased cognition, Decreased mobility, Decreased strength  Visit Diagnosis: Other abnormalities of gait and mobility  Unsteadiness on feet     Problem List Patient Active Problem List   Diagnosis Date Noted  . Visual disturbance   . Slow transit constipation   . Hypoalbuminemia due to protein-calorie malnutrition ([place])   . Acute blood loss anemia   . S/P VP shunt   . Hydrocephalus ([place]) 08/30/2018  . Dyslipidemia   . History of CVA (cerebrovascular accident)   . Benign essential HTN   . Tachycardia   . Leukocytosis   . Hyponatremia   . Hypokalemia   .  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 Infection of ventricular shunt ([place]) 08/09/2018  . Bacterial meningitis 08/09/2018  . TIA (transient ischemic attack) 01/29/2017  . Acute encephalopathy   . Shunt malfunction 03/13/2016  . 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 Hyperlipidemia 10/20/2014  . Degenerative disc disease, lumbar 04/15/2013  . Routine general medical examination at a health care facility 07/23/2012  . DISTURBANCE OF SKIN SENSATION 10/05/2008  . HYPERLIPIDEMIA 01/01/2008  . MYCOPLASMA PNEUMONIA 01/01/2008    Jaid Quirion L 10/18/2018, 8:53 AM  [facility] [address] [place], [place], [address] Phone: [phone]   Fax:  [phone]  Name: Charles Marquez MRN: [id number] Date of Birth: 1956/07/31  Geoffry Paradise, PT,DPT 10/18/18 8:54 AM Phone: [phone] Fax: [phone]

## 2018-10-22 ENCOUNTER — Ambulatory Visit: Payer: BLUE CROSS/BLUE SHIELD | Admitting: Speech Pathology

## 2018-10-22 ENCOUNTER — Encounter: Payer: Self-pay | Admitting: Speech Pathology

## 2018-10-22 ENCOUNTER — Ambulatory Visit: Payer: BLUE CROSS/BLUE SHIELD

## 2018-10-22 DIAGNOSIS — R2689 Other abnormalities of gait and mobility: Secondary | ICD-10-CM

## 2018-10-22 DIAGNOSIS — R41841 Cognitive communication deficit: Secondary | ICD-10-CM

## 2018-10-22 DIAGNOSIS — R2681 Unsteadiness on feet: Secondary | ICD-10-CM

## 2018-10-22 DIAGNOSIS — R4701 Aphasia: Secondary | ICD-10-CM

## 2018-10-22 DIAGNOSIS — R41842 Visuospatial deficit: Secondary | ICD-10-CM | POA: Diagnosis not present

## 2018-10-22 DIAGNOSIS — M6281 Muscle weakness (generalized): Secondary | ICD-10-CM

## 2018-10-22 NOTE — Patient Instructions (Signed)
  Keep list of referee words and IT words in notebook so we can build on them in ST  Practice making calls with basketball game muted on TV or recorded baseball game (use your clicker)

## 2018-10-22 NOTE — Therapy (Signed)
[facility] [address] [place], [place], [address] Phone: [phone]   Fax:  [phone]  Physical Therapy Treatment  Patient Details  Name: Charles Marquez MRN: [id number] Date of Birth: Aug 29, 1956 Referring Provider (PT): Dr. Posey Pronto    Encounter Date: 10/22/2018  PT End of Session - 10/22/18 1124    Visit Number  10    Number of Visits  17    Date for PT Re-Evaluation  11/18/18    Authorization Type  BCBS OTHER     Authorization Time Period  PT/OT/ST/Hydro therapy covered x 90 days    PT Start Time  1021    PT Stop Time  1100    PT Time Calculation (min)  39 min    Equipment Utilized During Treatment  --   min A to S prn   Activity Tolerance  Patient tolerated treatment well    Behavior During Therapy  [facility] for tasks assessed/performed       Past Medical History:  Diagnosis Date  . Anxiety   . Hypercholesteremia   . Stroke ([place])    tia's  . TIA (transient ischemic attack)    09.15    Past Surgical History:  Procedure Laterality Date  . Fractured arm Left 12  . HERNIA REPAIR Right 3/12  . LAPAROSCOPIC REVISION VENTRICULAR-PERITONEAL (V-P) SHUNT N/A 08/26/2018   Procedure: LAPAROSCOPIC INSERTION VENTRICULAR-PERITONEAL (V-P) SHUNT;  Surgeon: Earnie Larsson, MD;  Location: [facility];  Service: Neurosurgery;  Laterality: N/A;  . LOOP RECORDER INSERTION N/A 04/10/2017   Procedure: Loop Recorder Insertion;  Surgeon: Thompson Grayer, MD;  Location: [facility];  Service: Cardiovascular;  Laterality: N/A;  . SHUNT REMOVAL Right 03/13/2016   Procedure: SHUNT REMOVAL;  Surgeon: Earnie Larsson, MD;  [facility];  Service: Neurosurgery;  Laterality: Right;  . SHUNT REMOVAL Right 08/09/2018   Procedure: SHUNT REMOVAL With Placement of Ventricular Catheter;  Surgeon: Consuella Lose, MD;  Location: [place];  Service: Neurosurgery;  Laterality: Right;  . SHUNT REVISION Right 08/05/2018   Procedure: SHUNT  REVISION;  Surgeon: Earnie Larsson, MD;  Location: [facility];  Service: Neurosurgery;  Laterality: Right;  . SHUNT REVISION VENTRICULAR-PERITONEAL Left 08/26/2018   Procedure: SHUNT REVISION VENTRICULAR-PERITONEAL;  Surgeon: Earnie Larsson, MD;  Location: [facility];  Service: Neurosurgery;  Laterality: Left;  . SHUNT REVISION VENTRICULAR-PERITONEAL Left 09/02/2018   Procedure: Left Occipital VP shunt revision;  Surgeon: Earnie Larsson, MD;  Location: [facility];  Service: Neurosurgery;  Laterality: Left;  [name] VASECTOMY  10/02/1997  . VENTRICULOPERITONEAL SHUNT Right 12/18/2014   Procedure: Shunt Placment - right occipital VP shunt ;  Surgeon: Charlie Pitter, MD;  Location: [facility];  Service: Neurosurgery;  Laterality: Right;  Shunt Placment - right occipital VP shunt   . VENTRICULOPERITONEAL SHUNT Right 07/22/2018   Procedure: Shunt Placment right occipital;  Surgeon: Earnie Larsson, MD;  Location: [facility];  Service: Neurosurgery;  Laterality: Right;  . VENTRICULOPERITONEAL SHUNT Left 08/26/2018   Procedure: LEFT SIDED VENTRICULAR-PERITONEAL SHUNT;  Surgeon: Earnie Larsson, MD;  Location: [facility];  Service: Neurosurgery;  Laterality: Left;  [name] VENTRICULOSTOMY Right 08/09/2018   Procedure: VENTRICULOSTOMY;  Surgeon: Consuella Lose, MD;  Location: [facility] [facility];  Service: Neurosurgery;  Laterality: Right;    There were no vitals filed for this visit.  Subjective Assessment - 10/22/18 1025    Subjective  Pt denied falls or changes since last visit. Pt reported BP has been WNL.     Patient  is accompained by:  --   Charles Marquez-neighbor   Pertinent History   S/p VP shunt, Hydrocephalus, CVA with loop recorder insertion 04/10/2017 as well as R occipital VP shunt placement 2016, Infection of ventricular shunt, shunt malfunction, HLD, anxiety, TIA, Hypercholesteremia, acute blood loss anemia, HTN, Tachycardia, Leukocytosis, Hyponatremia, Bacterial Encephalitis, Bacterial Meningitis, Dyslipidemia, DDD lumbar spine, and Mycoplasma Pneumonia.      Patient Stated Goals  "Id love to be able to run backwards again to [employer] basketball games and improve my memory."    Currently in Pain?  No/denies          Therex and neuro re-ed: Access Code: [email or web address]  URL: [email or web address]  Date: 10/22/2018  Prepared by: Geoffry Paradise   Exercises  Side Stepping with Resistance at Thighs and Ankles - 4 reps - 1 sets - 1x daily - 3x weekly  Marching with Resistance - 4 reps - 1x daily - 3x weekly  Standing Single Leg Stance with Counter Support - 3 sets - 10 SECONDS hold - 1x daily - 3x weekly (NMR) Half-Kneeling to Standing - 3 reps - 1x daily - 7x weekly              [facility] Adult PT Treatment/Exercise - 10/22/18 1121      High Level Balance   High Level Balance Activities  Other (comment)    High Level Balance Comments  Performed over blue mats with min A to min guard for safety and to maintain balance. Pt performed single, double, triple cone taps with BLE 2x4 cones/LE/activity and cone knock down/turn upright. Cues to improve lateral weight shifting.              PT Education - 10/22/18 1123    Education Details  PT discussed goal progress and updated strengthening and SLS HEP. PT also reviewed balance HEP handout.     Person(s) Educated  Patient;Other (comment)   neighbor: Charles Marquez   Methods  Explanation;Demonstration;Tactile cues;Verbal cues;Handout    Comprehension  Returned demonstration;Verbalized understanding       PT Short Term Goals - 10/22/18 1127      PT SHORT TERM GOAL #1   Title  Pt will participate in establishment of initial HEP to improve strength, balance, and functional mobility.     Time  4    Period  Weeks    Status  Achieved      PT SHORT TERM GOAL #2   Title  Pt will ambulate 500 feet outdoors navigating uneven terrain, curbs, ramps, and perform dual cognitive tasks with therapist providing supervision to improve safety with community distances.     Time  4    Period   Weeks    Status  Achieved      PT SHORT TERM GOAL #3   Title  Pt will perform 12 steps with reciprocal stepping technique and single HR demonstrating improvement in safety with community accessibility with therapist providing supervision.     Time  4    Period  Weeks    Status  Achieved      PT SHORT TERM GOAL #4   Title  Pt will report no LOB episodes when walking over his gravel drive way indicating improvement in safety with functional mobility at home.    Time  4    Period  Weeks    Status  Achieved      PT SHORT TERM GOAL #5   Title  Pt will improve FGA score to >/= 20/30 indicating improvement  in functional mobility.     Baseline  22/30 on 10/18/18    Time  4    Period  Weeks    Status  Achieved        PT Long Term Goals - 09/19/18 1530      PT LONG TERM GOAL #1   Title  Pt will be independent and compliant with performing is HEP to improve balance, strength, and functional mobility.    Time  8    Period  Weeks    Status  New    Target Date  11/18/18      PT LONG TERM GOAL #2   Title  Pt will ambulate 1000 feet outdoors at mod I level navigating uneven terrain, curbs, ramps, and perform dual cognitive tasks to increase safety with community distances.     Time  8    Period  Weeks    Status  New    Target Date  11/18/18      PT LONG TERM GOAL #3   Title  Pt will demonstrate 12 steps with no HR's at mod I level with good safety awareness to improve community accessibility.     Time  8    Period  Weeks    Status  New    Target Date  11/18/18      PT LONG TERM GOAL #4   Title  Pt will improve FGA score to >/=25/30 indicating improvement in functional mobility.     Time  8    Period  Weeks    Status  New    Target Date  11/18/18            Plan - 10/22/18 1026    Clinical Impression Statement  Pt demonstrated progress, as he met STG 1. Pt continues to experience incr. postural sway and LOB during SLS (R>L) actvities and required cues to improve lateral  weight shifting. Pt required cues for improved eccentric control during strengthening HEP but was able to quickly correct. Pt would continue to benefit from skilled PT to improve safety during functional mobility.     Rehab Potential  Good    Clinical Impairments Affecting Rehab Potential  Pt has short term memory deficits and cognitive changes from baseline which may negatively influence his ability to recall exercises and progressions in therapy.     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  high level balance and strengthening in quadruped/ tall kneeling.  Avoid activities where he is bending forwards/head down    PT Home Exercise Plan  [email or web address]     Consulted and Agree with Plan of Care  Patient;Family member/caregiver    Family Member Consulted  wife- kim        Patient will benefit from skilled therapeutic intervention in order to improve the following deficits and impairments:  Abnormal gait, Decreased coordination, Decreased safety awareness, Impaired vision/preception, Decreased balance, Decreased cognition, Decreased mobility, Decreased strength  Visit Diagnosis: Other abnormalities of gait and mobility  Muscle weakness (generalized)  Unsteadiness on feet     Problem List Patient Active Problem List   Diagnosis Date Noted  . Visual disturbance   .  Slow transit constipation   . Hypoalbuminemia due to protein-calorie malnutrition ([place])   . Acute blood loss  anemia   . S/P VP shunt   . Hydrocephalus ([place]) 08/30/2018  . Dyslipidemia   . History of CVA (cerebrovascular accident)   . Benign essential HTN   . Tachycardia   . Leukocytosis   . Hyponatremia   . Hypokalemia   . 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 Infection of ventricular shunt ([place]) 08/09/2018  . Bacterial meningitis 08/09/2018  . TIA (transient ischemic attack) 01/29/2017  . Acute encephalopathy   . Shunt malfunction 03/13/2016  . 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 Hyperlipidemia 10/20/2014  . Degenerative disc disease, lumbar 04/15/2013  . Routine general medical examination at a health care facility 07/23/2012  . DISTURBANCE OF SKIN SENSATION 10/05/2008  . HYPERLIPIDEMIA 01/01/2008  . MYCOPLASMA PNEUMONIA 01/01/2008    Charles Marquez L 10/22/2018, 11:28 AM  [facility] [address] [place], [place], [address] Phone: [phone]   Fax:  [phone]  Name: Charles Marquez MRN: [id number] Date of Birth: Sep 01, 1956  Geoffry Paradise, PT,DPT 10/22/18 11:29 AM Phone: [phone] Fax: [phone]

## 2018-10-22 NOTE — Patient Instructions (Signed)
Access Code: [email or web address]  URL: [email or web address]  Date: 10/22/2018  Prepared by: Zerita BoersJennifer Kamaria Lucia   Exercises  Side Stepping with Resistance at Thighs and Ankles - 4 reps - 1 sets - 1x daily - 3x weekly  Marching with Resistance - 4 reps - 1x daily - 3x weekly  Standing Single Leg Stance with Counter Support - 3 sets - 10 SECONDS hold - 1x daily - 3x weekly  Half-Kneeling to Standing - 3 reps - 1x daily - 7x weekly

## 2018-10-22 NOTE — Therapy (Signed)
[facility] [address] [place], [place], [address] Phone: [phone]   Fax:  [phone]  Speech Language Pathology Treatment  Patient Details  Name: Charles Marquez MRN: [id number] Date of Birth: 02-Sep-1956 Referring Provider (SLP): Dr. Allena Katz   Encounter Date: 10/22/2018  End of Session - 10/22/18 1225    Visit Number  8    Number of Visits  17    Date for SLP Re-Evaluation  11/18/18    Authorization Type  BCBS 90 visits for all disciplines     SLP Start Time  [phone]    SLP Stop Time   1012    SLP Time Calculation (min)  47 min    Activity Tolerance  Patient tolerated treatment well       Past Medical History:  Diagnosis Date  . Anxiety   . Hypercholesteremia   . Stroke ([facility])    tia's  . TIA (transient ischemic attack)    09.15    Past Surgical History:  Procedure Laterality Date  . Fractured arm Left 12  . HERNIA REPAIR Right 3/12  . LAPAROSCOPIC REVISION VENTRICULAR-PERITONEAL (V-P) SHUNT N/A 08/26/2018   Procedure: LAPAROSCOPIC INSERTION VENTRICULAR-PERITONEAL (V-P) SHUNT;  Surgeon: Julio Sicks, MD;  [facility];  Service: Neurosurgery;  Laterality: N/A;  . LOOP RECORDER INSERTION N/A 04/10/2017   Procedure: Loop Recorder Insertion;  Surgeon: Hillis Range, MD;  [facility];  Service: Cardiovascular;  Laterality: N/A;  . SHUNT REMOVAL Right 03/13/2016   Procedure: SHUNT REMOVAL;  Surgeon: Julio Sicks, MD;  [facility];  Service: Neurosurgery;  Laterality: Right;  . SHUNT REMOVAL Right 08/09/2018   Procedure: SHUNT REMOVAL With Placement of Ventricular Catheter;  Surgeon: Lisbeth Renshaw, MD;  Location: [facility] OR;  Service: Neurosurgery;  Laterality: Right;  . SHUNT REVISION Right 08/05/2018   Procedure: SHUNT REVISION;  Surgeon: Julio Sicks, MD;  Location: [facility] OR;  Service: Neurosurgery;  Laterality: Right;  . SHUNT REVISION VENTRICULAR-PERITONEAL Left 08/26/2018   Procedure: SHUNT REVISION VENTRICULAR-PERITONEAL;  Surgeon: Julio Sicks, MD;  Location: [facility] OR;  Service: Neurosurgery;  Laterality: Left;  . SHUNT REVISION VENTRICULAR-PERITONEAL Left 09/02/2018   Procedure: Left Occipital VP shunt revision;  Surgeon: Julio Sicks, MD;  Location: [facility] OR;  Service: Neurosurgery;  Laterality: Left;  [name] VASECTOMY  10/02/1997  . 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 VENTRICULOPERITONEAL SHUNT Right 07/22/2018   Procedure: Shunt Placment right occipital;  Surgeon: Julio Sicks, MD;  Location: [facility] OR;  Service: Neurosurgery;  Laterality: Right;  . VENTRICULOPERITONEAL SHUNT Left 08/26/2018   Procedure: LEFT SIDED VENTRICULAR-PERITONEAL SHUNT;  Surgeon: Julio Sicks, MD;  Location: [facility] OR;  Service: Neurosurgery;  Laterality: Left;  [name] VENTRICULOSTOMY Right 08/09/2018   Procedure: VENTRICULOSTOMY;  Surgeon: Lisbeth Renshaw, MD;  Location: [facility] OR;  Service: Neurosurgery;  Laterality: Right;    There were no vitals filed for this visit.  Subjective Assessment - 10/22/18 0931    Subjective  "I'm not sure if what your looking for" In binder when asked if he had HW"    Currently in Pain?  No/denies            ADULT SLP TREATMENT - 10/22/18 0936      General Information   Behavior/Cognition  Cooperative;Alert;Pleasant mood  Treatment Provided   Treatment provided  Cognitive-Linquistic      Pain Assessment   Pain Assessment  No/denies pain      Cognitive-Linquistic Treatment   Treatment focused on  Aphasia;Cognition    Skilled Treatment  Pt is accompanied by his neighbor. generated 10 immediate and extended family verbally and written with minimal exteded time. Pt noted this as improvement and took a photo to show his wife. Persoanlly relevant word finding (and  written exptessoin) of referee terms for baseball and basketball generated 10 -15 for each sport with semantic cues from ST and pt's neighbor, Jake Shark. Pt independently noted aphasic errors in written words, and required occasional min A to correct written errors. 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 Pt to watch basketball game on mute and make ref calls.       Assessment / Recommendations / Plan   Plan  Continue with current plan of care      Progression Toward Goals   Progression toward goals  Progressing toward goals         SLP Short Term Goals - 10/22/18 1223      SLP SHORT TERM GOAL #1   Title  Pt will complete standardized assessment of cognition.    Status  Achieved      SLP SHORT TERM GOAL #2   Title  Pt will ID object/picture to simple description/feature/function f:4 with occasional min A over 3 sessions     Baseline  10/01/18; 10/03/18    Time  1    Period  Weeks    Status  On-going      SLP SHORT TERM GOAL #3   Title  Pt will demo auditory comprehension of 5 minutes simple-mod complex conversation by responding appropriately or asking questions over 2 sessions    Baseline  10/17/18    Time  1    Period  Weeks    Status  Revised      SLP SHORT TERM GOAL #4   Title  Pt will name basic objects/pictures 7/10 correct with occasional mod A over 3 sessions     Baseline  10/03/18    Time  2    Period  Weeks    Status  On-going      SLP SHORT TERM GOAL #5   Title  Pt will demo sustained attention for 10 minutes in simple cognitive-linguistic task x3 sessions.    Baseline  10/17/18 (conversation); 10/22/18 (written task)    Time  1    Period  Weeks    Status  On-going      SLP SHORT TERM GOAL #6   Title  Pt will  have a memory system to assist in managing appointments, schedules, medical and therapy information and bring with him to 3 therapy sessions    10/15/18, 10/17/18   Time  2    Period  Weeks    Status  On-going       SLP Long Term Goals - 10/22/18 1224      SLP LONG TERM GOAL #1   Title  Pt will write or name 5 items in a category with occasional min A over 4 sessions     Time  5    Period  Weeks   or 17 visits for all LTGs   Status  On-going      SLP LONG TERM GOAL #2   Title  Pt will demo error awareness by attempting correction or by nonverbal response to errors 75% of the time over 3 sessions    Baseline  10/17/18    Time  5    Period  Weeks    Status  On-going      SLP LONG TERM GOAL #3   Title  Pt will participate functionally in 10 minutes simple-mod complex conversation with conversational supports for aphasia over 3 sessions.    Baseline  10/17/18    Time  5    Period  Weeks    Status  On-going      SLP LONG TERM GOAL #4   Title  Pt will demo selective attention in min noisy environment for 10 minutes in a simple-mod complex cognitive linguistic task over three sessions    Time  5    Period  Weeks    Status  On-going      SLP LONG TERM GOAL #5   Title  Pt will utilize memory compensation system to recall details/manage appointments, schedules, medical and therapy information with rare min A over 4 sessions      Time  5    Period  Weeks    Status  On-going       Plan - 10/22/18 1222    Clinical Impression Statement  Mr. Huston FoleyLolley continues to present with aphasia, attention and memory impairments. Significant improvements in auditory comprehension and verbal expression since last seen by this SLP; aphasia appears today to be more mild-moderate. With mod complex-complex conversation, pt continues with wordfinding difficulties and frequent paraphasias, of which his awareness is improving. Auditory comprehension goal has been revised to reflect pt's progress. Pt continues  to exhibit anxiety and some frustration with errors. Continue skilled ST to maximize communication and cognition for wants/needs, safety, independence and QOL.    Treatment/Interventions  Cognitive reorganization;Multimodal communcation approach;Environmental controls;Compensatory strategies;Language facilitation;Compensatory techniques;Cueing hierarchy;Internal/external aids;Functional tasks;SLP instruction and feedback;Patient/family education    Potential to Achieve Goals  Good    Potential Considerations  Severity of impairments       Patient will benefit from skilled therapeutic intervention in order to improve the following deficits and impairments:   Aphasia  Cognitive communication deficit    Problem List Patient Active Problem List   Diagnosis Date Noted  . Visual disturbance   . Slow transit constipation   . Hypoalbuminemia due to protein-calorie malnutrition ([facility])   . Acute blood loss anemia   . S/P VP shunt   . Hydrocephalus ([facility]) 08/30/2018  . Dyslipidemia   . History of CVA (cerebrovascular accident)   . Benign essential HTN   . Tachycardia   . Leukocytosis   . Hyponatremia   . Hypokalemia   . 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 Bacterial meningitis 08/09/2018  . TIA (transient ischemic attack) 01/29/2017  . Acute encephalopathy   . Shunt malfunction 03/13/2016  . 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 Hyperlipidemia 10/20/2014  . Degenerative disc disease, lumbar 04/15/2013  . Routine general medical examination at a health care facility 07/23/2012  . DISTURBANCE OF SKIN SENSATION 10/05/2008  . HYPERLIPIDEMIA 01/01/2008  . MYCOPLASMA PNEUMONIA 01/01/2008    , Radene JourneyLaura Ann MS, CCC-SLP 10/22/2018, 12:26 PM  [facility] [phone]  [facility] [address] [place], [place], [address] Phone: [phone]    Fax:  [phone]   Name: Teryl Mcconaghy MRN: [id number] Date of Birth: Aug 27, 1956

## 2018-10-24 ENCOUNTER — Ambulatory Visit: Payer: BLUE CROSS/BLUE SHIELD

## 2018-10-24 ENCOUNTER — Ambulatory Visit: Payer: BLUE CROSS/BLUE SHIELD | Admitting: Speech Pathology

## 2018-10-24 DIAGNOSIS — R4701 Aphasia: Secondary | ICD-10-CM

## 2018-10-24 DIAGNOSIS — R41841 Cognitive communication deficit: Secondary | ICD-10-CM

## 2018-10-24 DIAGNOSIS — R2681 Unsteadiness on feet: Secondary | ICD-10-CM

## 2018-10-24 DIAGNOSIS — R2689 Other abnormalities of gait and mobility: Secondary | ICD-10-CM

## 2018-10-24 DIAGNOSIS — M6281 Muscle weakness (generalized): Secondary | ICD-10-CM

## 2018-10-24 DIAGNOSIS — R41842 Visuospatial deficit: Secondary | ICD-10-CM | POA: Diagnosis not present

## 2018-10-24 NOTE — Patient Instructions (Signed)
You can try looking up local games on youtube. Search for [facility] and the name of the team and basketball, baseball, etc. Practice making calls (hand signal and verbal signal). You can pause or rewind if you need to.   Here is a link to a full game: [email or web address]/[email or web address]?[email or web address]

## 2018-10-24 NOTE — Therapy (Signed)
[facility] [address] [place], [place], [address] Phone: [phone]   Fax:  [phone]  Speech Language Pathology Treatment  Patient Details  Name: Charles Marquez MRN: [id number] Date of Birth: [birth year] Referring Provider (SLP): Dr. Posey Pronto   Encounter Date: 10/24/2018  End of Session - 10/24/18 1737    Visit Number  9    Number of Visits  17    Date for SLP Re-Evaluation  11/18/18    Authorization Type  BC[employer] 90 visits for all disciplines     SLP Start Time  1696    SLP Stop Time   1447    SLP Time Calculation (min)  45 min    Activity Tolerance  Patient tolerated treatment well       Past Medical History:  Diagnosis Date  . Anxiety   . Hypercholesteremia   . Stroke ([place])    tia's  . TIA (transient ischemic attack)    09.15    Past Surgical History:  Procedure Laterality Date  . Fractured arm Left 12  . HERNIA REPAIR Right 3/12  . LAPAROSCOPIC REVISION VENTRICULAR-PERITONEAL (V-P) SHUNT N/A 08/26/2018   Procedure: LAPAROSCOPIC INSERTION VENTRICULAR-PERITONEAL (V-P) SHUNT;  Surgeon: Earnie Larsson, MD;  Location: [facility];  Service: Neurosurgery;  Laterality: N/A;  . LOOP RECORDER INSERTION N/A 04/10/2017   Procedure: Loop Recorder Insertion;  Surgeon: [facility] Grayer, MD;  Location: [facility];  Service: Cardiovascular;  Laterality: N/A;  . SHUNT REMOVAL Right 03/13/2016   Procedure: SHUNT REMOVAL;  Surgeon: Earnie Larsson, MD;  [facility];  Service: Neurosurgery;  Laterality: Right;  . SHUNT REMOVAL Right 08/09/2018   Procedure: SHUNT REMOVAL With Placement of Ventricular Catheter;  Surgeon: Consuella Lose, MD;  Location: [facility];  Service: Neurosurgery;  Laterality: Right;  . SHUNT REVISION Right 08/05/2018   Procedure: SHUNT REVISION;  Surgeon: Earnie Larsson, MD;  Location: [facility];  Service: Neurosurgery;  Laterality: Right;  . SHUNT REVISION VENTRICULAR-PERITONEAL Left 08/26/2018    Procedure: SHUNT REVISION VENTRICULAR-PERITONEAL;  Surgeon: Earnie Larsson, MD;  Location: [facility];  Service: Neurosurgery;  Laterality: Left;  . SHUNT REVISION VENTRICULAR-PERITONEAL Left 09/02/2018   Procedure: Left Occipital VP shunt revision;  Surgeon: Earnie Larsson, MD;  Location: [facility];  Service: Neurosurgery;  Laterality: Left;  [name] VASECTOMY  10/02/1997  . VENTRICULOPERITONEAL SHUNT Right 12/18/2014   Procedure: Shunt Placment - right occipital VP shunt ;  Surgeon: Charlie Pitter, MD;  Location: [facility];  Service: Neurosurgery;  Laterality: Right;  Shunt Placment - right occipital VP shunt   . VENTRICULOPERITONEAL SHUNT Right 07/22/2018   Procedure: Shunt Placment right occipital;  Surgeon: Earnie Larsson, MD;  Location: [facility];  Service: Neurosurgery;  Laterality: Right;  . VENTRICULOPERITONEAL SHUNT Left 08/26/2018   Procedure: LEFT SIDED VENTRICULAR-PERITONEAL SHUNT;  Surgeon: Earnie Larsson, MD;  Location: [facility];  Service: Neurosurgery;  Laterality: Left;  [name] VENTRICULOSTOMY Right 08/09/2018   Procedure: VENTRICULOSTOMY;  Surgeon: Consuella Lose, MD;  Location: [facility];  Service: Neurosurgery;  Laterality: Right;    There were no vitals filed for this visit.  Subjective Assessment - 10/24/18 1403    Subjective  Pt reports having difficulty finding games to call on TV.    Patient is accompained by:  Family member   wife   Currently in Pain?  No/denies            ADULT SLP TREATMENT - 10/24/18 1402      General Information  Behavior/Cognition  Cooperative;Alert;Pleasant mood      Treatment Provided   Treatment provided  Cognitive-Linquistic      Pain Assessment   Pain Assessment  No/denies pain      Cognitive-Linquistic Treatment   Treatment focused on  Aphasia;Cognition    Skilled Treatment  Given "S", SLP suggested using YouTube to locate local high school games to practice referee calls. Pt agreed it would be helpful to view games in familiar settings and to have the ability to  pause videos while watching. Personally relevant wordfinding of local team names: pt generated 7 local teams with occasional question cues from SLP, wife. Pt showed SLP list of professional terms he created for homework. Using words from pt's generated lists (sports, professional terms), SLP used semantic feature analysis to elicit descriptions, associations (occasional min-mod cues). Simple conversation (10 minutes) re: sports teams with occasional min question cues to clarify vague, circumlocutory speech.       Assessment / Recommendations / Plan   Plan  Continue with current plan of care      Progression Toward Goals   Progression toward goals  Progressing toward goals         SLP Short Term Goals - 10/24/18 1732      SLP SHORT TERM GOAL #1   Title  Pt will complete standardized assessment of cognition.    Status  Achieved      SLP SHORT TERM GOAL #2   Title  Pt will ID object/picture to simple description/feature/function f:4 with occasional min A over 3 sessions     Baseline  1    Period  Weeks    Status  Partially Met      SLP SHORT TERM GOAL #3   Title  Pt will demo auditory comprehension of 5 minutes simple-mod complex conversation by responding appropriately or asking questions over 2 sessions    Baseline  10/17/18 10/24/18    Time  1    Period  Weeks    Status  Achieved      SLP SHORT TERM GOAL #4   Title  Pt will name basic objects/pictures 7/10 correct with occasional mod A over 3 sessions     Time  1    Period  Weeks    Status  Partially Met      SLP SHORT TERM GOAL #5   Title  Pt will demo sustained attention for 10 minutes in simple cognitive-linguistic task x3 sessions.    Baseline  10/17/18 (conversation); 10/22/18, 10/24/18 (written task)    Time  1    Period  Weeks    Status  On-going      SLP SHORT TERM GOAL #6   Title  Pt will have a memory system to assist in managing appointments, schedules, medical and therapy information and bring with him to 3  therapy sessions     Time  1    Period  Weeks    Status  Achieved       SLP Long Term Goals - 10/24/18 1736      SLP LONG TERM GOAL #1   Title  Pt will write or name 5 items in a category with occasional min A over 4 sessions     Time  5    Period  Weeks   or 17 visits, for all LTGs   Status  On-going      SLP LONG TERM GOAL #2   Title  Pt will demo error awareness by attempting  correction or by nonverbal response to errors 75% of the time over 3 sessions    Baseline  10/17/18    Time  5    Period  Weeks    Status  On-going      SLP LONG TERM GOAL #3   Title  Pt will participate functionally in 10 minutes simple-mod complex conversation with conversational supports for aphasia over 3 sessions.    Baseline  10/17/18    Time  5    Period  Weeks    Status  On-going      SLP LONG TERM GOAL #4   Title  Pt will demo selective attention in min noisy environment for 10 minutes in a simple-mod complex cognitive linguistic task over three sessions    Time  5    Period  Weeks    Status  On-going      SLP LONG TERM GOAL #5   Title  Pt will utilize memory compensation system to recall details/manage appointments, schedules, medical and therapy information with rare min A over 4 sessions      Time  5    Period  Weeks    Status  On-going       Plan - 10/24/18 1737    Clinical Impression Statement  Charles Marquez continues to present with aphasia, attention and memory impairments. Significant improvements in auditory comprehension and verbal expression since last seen by this SLP; aphasia appears today to be more mild-moderate. With mod complex-complex conversation, pt continues with wordfinding difficulties and frequent paraphasias, of which his awareness is improving. Auditory comprehension goal has been revised to reflect pt's progress. Pt continues to exhibit anxiety and some frustration with errors. Continue skilled ST to maximize communication and cognition for wants/needs, safety,  independence and QOL.       Patient will benefit from skilled therapeutic intervention in order to improve the following deficits and impairments:   Aphasia  Cognitive communication deficit    Problem List Patient Active Problem List   Diagnosis Date Noted  . Visual disturbance   . Slow transit constipation   . Hypoalbuminemia due to protein-calorie malnutrition ([place])   . Acute blood loss anemia   . S/P VP shunt   . Hydrocephalus ([place]) 08/30/2018  . Dyslipidemia   . History of CVA (cerebrovascular accident)   . Benign essential HTN   . Tachycardia   . Leukocytosis   . Hyponatremia   . Hypokalemia   . 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 Bacterial meningitis 08/09/2018  . TIA (transient ischemic attack) 01/29/2017  . Acute encephalopathy   . Shunt malfunction 03/13/2016  . 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 Hyperlipidemia 10/20/2014  . Degenerative disc disease, lumbar 04/15/2013  . Routine general medical examination at a health care facility 07/23/2012  . DISTURBANCE OF SKIN SENSATION 10/05/2008  . HYPERLIPIDEMIA 01/01/2008  . MYCOPLASMA PNEUMONIA 01/01/2008   Deneise Lever, [place], [place] E Arbor Cohen 10/24/2018, 5:38 PM  [facility] [address] [place], [place], [address] Phone: [phone]   Fax:  [phone]   Name: Charles Marquez MRN: [id number] Date of Birth: Jul 22, 1956

## 2018-10-24 NOTE — Therapy (Signed)
[facility] [address] [place], [place], [address] Phone: [phone]   Fax:  [phone]  Physical Therapy Treatment  Patient Details  Name: Charles Marquez MRN: [id number] Date of Birth: Feb 06, 1956 Referring Provider (PT): Dr. Allena Katz    Encounter Date: 10/24/2018  PT End of Session - 10/25/18 1223    Visit Number  11    Number of Visits  17    Date for PT Re-Evaluation  11/18/18    Authorization Type  BCBS OTHER     Authorization Time Period  PT/OT/ST/Hydro therapy covered x 90 days    PT Start Time  1450   with Speech   PT Stop Time  1530    PT Time Calculation (min)  40 min    Equipment Utilized During Treatment  --   min A to S prn   Activity Tolerance  Patient tolerated treatment well    Behavior During Therapy  [facility] for tasks assessed/performed       Past Medical History:  Diagnosis Date  . Anxiety   . Hypercholesteremia   . Stroke ([facility])    tia's  . TIA (transient ischemic attack)    09.15    Past Surgical History:  Procedure Laterality Date  . Fractured arm Left 12  . HERNIA REPAIR Right 3/12  . LAPAROSCOPIC REVISION VENTRICULAR-PERITONEAL (V-P) SHUNT N/A 08/26/2018   Procedure: LAPAROSCOPIC INSERTION VENTRICULAR-PERITONEAL (V-P) SHUNT;  Surgeon: Julio Sicks, MD;  [facility];  Service: Neurosurgery;  Laterality: N/A;  . LOOP RECORDER INSERTION N/A 04/10/2017   Procedure: Loop Recorder Insertion;  Surgeon: Hillis Range, MD;  [facility];  Service: Cardiovascular;  Laterality: N/A;  . SHUNT REMOVAL Right 03/13/2016   Procedure: SHUNT REMOVAL;  Surgeon: Julio Sicks, MD;  [facility];  Service: Neurosurgery;  Laterality: Right;  . SHUNT REMOVAL Right 08/09/2018   Procedure: SHUNT REMOVAL With Placement of Ventricular Catheter;  Surgeon: Lisbeth Renshaw, MD;  [facility];  Service: Neurosurgery;  Laterality: Right;  . SHUNT REVISION Right 08/05/2018   Procedure:  SHUNT REVISION;  Surgeon: Julio Sicks, MD;  Location[facility];  Service: Neurosurgery;  Laterality: Right;  . SHUNT REVISION VENTRICULAR-PERITONEAL Left 08/26/2018   Procedure: SHUNT REVISION VENTRICULAR-PERITONEAL;  Surgeon: Julio Sicks, MD;  Location: [facility];  Service: Neurosurgery;  Laterality: Left;  . SHUNT REVISION VENTRICULAR-PERITONEAL Left 09/02/2018   Procedure: Left Occipital VP shunt revision;  Surgeon: Julio Sicks, MD;  Location: [facility] OR;  Service: Neurosurgery;  Laterality: Left;  [name] VASECTOMY  10/02/1997  . 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 VENTRICULOPERITONEAL SHUNT Right 07/22/2018   Procedure: Shunt Placment right occipital;  Surgeon: Julio Sicks, MD;  Location: [facility] OR;  Service: Neurosurgery;  Laterality: Right;  . VENTRICULOPERITONEAL SHUNT Left 08/26/2018   Procedure: LEFT SIDED VENTRICULAR-PERITONEAL SHUNT;  Surgeon: Julio Sicks, MD;  Location: [facility] OR;  Service: Neurosurgery;  Laterality: Left;  [name] VENTRICULOSTOMY Right 08/09/2018   Procedure: VENTRICULOSTOMY;  Surgeon: Lisbeth Renshaw, MD;  Location: [facility] OR;  Service: Neurosurgery;  Laterality: Right;    There were no vitals filed for this visit.  Subjective Assessment - 10/24/18 1451    Subjective  Pt denied falls or changes since last visit. Pt reported BP has been WNL. Halfway through  session pt's wife reported pt amb. with "shuffling" feet.     Patient is accompained by:  Family member   wife: Charles Marquez   Pertinent History   S/p VP shunt, Hydrocephalus, CVA with loop recorder insertion 04/10/2017 as well as R occipital VP shunt placement 2016, Infection of ventricular shunt, shunt malfunction, HLD, anxiety, TIA, Hypercholesteremia, acute blood loss anemia, HTN, Tachycardia, Leukocytosis, Hyponatremia, Bacterial Encephalitis,  Bacterial Meningitis, Dyslipidemia, DDD lumbar spine, and Mycoplasma Pneumonia.     Patient Stated Goals  "Id love to be able to run backwards again to [employer] basketball games and improve my memory."    Currently in Pain?  No/denies             NMR: -Tall kneeling: x10 squats; 2x10 squats with 5.5lb. ball with B shoulders flexed to 90 degrees. Cues to ensure equal weight through BLEs vs. Bias towards Marquez side. -Half kneeling: with intermittent UE support on bench and min A to min guard to ensure safety and to maintain balance. All activities performed with R foot in front and then Marquez foot in front. Pt performed static balance 2x30 sec. With cues for upright posture, isometric contractions of front LE hamstring muscles and contralaterlateral hip flexors, and improved core activiation. Pt then performed trunk rotation to R and Marquez side x5 reps/direction with B shoulder abd. To 90 degrees. PT also provided external pertubations to trunk and shoulders x20 reps to improve trunk stability and balance. Cues to decr. Forward knee from adducting/abducting during trunk rotation. Pt required rest breaks 2/2 fatigue.             [facility] Adult PT Treatment/Exercise - 10/25/18 1220      Ambulation/Gait   Ambulation/Gait  Yes    Ambulation/Gait Assistance  5: Supervision    Ambulation/Gait Assistance Details  Pt performed amb. in ladder to improve heel strike (Marquez>R) with frequent cues and demo. Pt then performed stepping over 2" beam in // bars to improve heel strike x15 reps. Ladder: 2x4reps.     Ambulation Distance (Feet)  --   [phone]' ladder and pregait activities   Assistive device  None;Parallel bars   0-1 UE support in //bars   Gait Pattern  Step-through pattern;Narrow base of support;Decreased dorsiflexion - left;Decreased dorsiflexion - right    Ambulation Surface  Level;Indoor    Pre-Gait Activities  see above.                PT Short Term Goals - 10/22/18 1127      PT SHORT  TERM GOAL #1   Title  Pt will participate in establishment of initial HEP to improve strength, balance, and functional mobility.     Time  4    Period  Weeks    Status  Achieved      PT SHORT TERM GOAL #2   Title  Pt will ambulate 500 feet outdoors navigating uneven terrain, curbs, ramps, and perform dual cognitive tasks with therapist providing supervision to improve safety with community distances.     Time  4    Period  Weeks    Status  Achieved      PT SHORT TERM GOAL #3   Title  Pt will perform 12 steps with reciprocal stepping technique and single HR demonstrating improvement in safety with community accessibility with therapist providing supervision.     Time  4    Period  Weeks    Status  Achieved      PT SHORT TERM GOAL #  4   Title  Pt will report no LOB episodes when walking over his gravel drive way indicating improvement in safety with functional mobility at home.    Time  4    Period  Weeks    Status  Achieved      PT SHORT TERM GOAL #5   Title  Pt will improve FGA score to >/= 20/30 indicating improvement in functional mobility.     Baseline  22/30 on 10/18/18    Time  4    Period  Weeks    Status  Achieved        PT Long Term Goals - 09/19/18 1530      PT LONG TERM GOAL #1   Title  Pt will be independent and compliant with performing is HEP to improve balance, strength, and functional mobility.    Time  8    Period  Weeks    Status  New    Target Date  11/18/18      PT LONG TERM GOAL #2   Title  Pt will ambulate 1000 feet outdoors at mod I level navigating uneven terrain, curbs, ramps, and perform dual cognitive tasks to increase safety with community distances.     Time  8    Period  Weeks    Status  New    Target Date  11/18/18      PT LONG TERM GOAL #3   Title  Pt will demonstrate 12 steps with no HR's at mod I level with good safety awareness to improve community accessibility.     Time  8    Period  Weeks    Status  New    Target Date  11/18/18       PT LONG TERM GOAL #4   Title  Pt will improve FGA score to >/=25/30 indicating improvement in functional mobility.     Time  8    Period  Weeks    Status  New    Target Date  11/18/18            Plan - 10/24/18 1452    Clinical Impression Statement  Pt demonstrated progress, as he was able to improve B heel strike with cues and demo while amb. across ladder and performing pre-gait activites. Today's skilled session also focused on performing activities to improve LE/core strength, coordination, and balance. Pt required cues to engage core and LE musculature in half kneeling to maintain balance and was able to progress from min A to min guard to maintain balance. Continue with POC.     Rehab Potential  Good    Clinical Impairments Affecting Rehab Potential  Pt has short term memory deficits and cognitive changes from baseline which may negatively influence his ability to recall exercises and progressions in therapy.     PT Frequency  2x / week    PT Duration  8 weeks    PT Treatment/Interventions  ADLs/Self Care Home Management;Therapeutic activities;Dry needling;Therapeutic exercise;Balance training;Neuromuscular re-education;Manual techniques;Visual/perceptual remediation/compensation;Patient/family education;Stair training;Gait training;Electrical Stimulation;Functional mobility training;Passive range of motion;DME Instruction    PT Next Visit Plan  Continue cone taps on mat, dual tasking, increasing gait speed forwards/backwards/sudden stops/head turns to simulate officiating basketball.  high level balance and strengthening in quadruped/ tall kneeling.  Avoid activities where he is bending forwards/head down    PT Home Exercise Plan  [id number]     Consulted and Agree with Plan of Care  Patient;Family member/caregiver    Family Member Consulted  wife- kim        Patient will benefit from skilled therapeutic intervention in order to improve the following deficits and impairments:   Abnormal gait, Decreased coordination, Decreased safety awareness, Impaired vision/preception, Decreased balance, Decreased cognition, Decreased mobility, Decreased strength  Visit Diagnosis: Other abnormalities of gait and mobility  Muscle weakness (generalized)  Unsteadiness on feet     Problem List Patient Active Problem List   Diagnosis Date Noted  . Visual disturbance   . Slow transit constipation   . Hypoalbuminemia due to protein-calorie malnutrition ([facility])   . Acute blood loss anemia   . S/P VP shunt   . Hydrocephalus ([facility]) 08/30/2018  . Dyslipidemia   . History of CVA (cerebrovascular accident)   . Benign essential HTN   . Tachycardia   . Leukocytosis   . Hyponatremia   . Hypokalemia   . 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 Bacterial meningitis 08/09/2018  . TIA (transient ischemic attack) 01/29/2017  . Acute encephalopathy   . Shunt malfunction 03/13/2016  . 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 Hyperlipidemia 10/20/2014  . Degenerative disc disease, lumbar 04/15/2013  . Routine general medical examination at a health care facility 07/23/2012  . DISTURBANCE OF SKIN SENSATION 10/05/2008  . HYPERLIPIDEMIA 01/01/2008  . MYCOPLASMA PNEUMONIA 01/01/2008    Charles Marquez 10/25/2018, 12:29 PM  [facility] [address] [place], [place], [address] Phone: [phone]   Fax:  [phone]  Name: Charles Marquez MRN: [id number] Date of Birth: [birth year]  Zerita Boers, PT,DPT 10/25/18 12:37 PM Phone: [phone] Fax: [phone]

## 2018-10-28 ENCOUNTER — Encounter: Payer: Self-pay | Admitting: Physical Therapy

## 2018-10-28 ENCOUNTER — Ambulatory Visit: Payer: BLUE CROSS/BLUE SHIELD | Admitting: Speech Pathology

## 2018-10-28 ENCOUNTER — Ambulatory Visit: Payer: BLUE CROSS/BLUE SHIELD | Admitting: Physical Therapy

## 2018-10-28 DIAGNOSIS — R2689 Other abnormalities of gait and mobility: Secondary | ICD-10-CM

## 2018-10-28 DIAGNOSIS — R2681 Unsteadiness on feet: Secondary | ICD-10-CM

## 2018-10-28 DIAGNOSIS — R4701 Aphasia: Secondary | ICD-10-CM

## 2018-10-28 DIAGNOSIS — R41842 Visuospatial deficit: Secondary | ICD-10-CM | POA: Diagnosis not present

## 2018-10-28 DIAGNOSIS — M6281 Muscle weakness (generalized): Secondary | ICD-10-CM

## 2018-10-28 DIAGNOSIS — R41841 Cognitive communication deficit: Secondary | ICD-10-CM

## 2018-10-28 NOTE — Therapy (Signed)
[facility] [address] [place], [place], [address] Phone: [phone]   Fax:  [phone]  Speech Language Pathology Treatment  Patient Details  Name: Charles Marquez MRN: [id number] Date of Birth: 1956/04/12 Referring Provider (SLP): Dr. Posey Pronto   Encounter Date: 10/28/2018  End of Session - 10/28/18 1700    Visit Number  10    Number of Visits  17    Date for SLP Re-Evaluation  11/18/18    Authorization Type  BCBS 90 visits for all disciplines     SLP Start Time  8366    SLP Stop Time   1447    SLP Time Calculation (min)  45 min    Activity Tolerance  Patient tolerated treatment well       Past Medical History:  Diagnosis Date  . Anxiety   . Hypercholesteremia   . Stroke ([place])    tia's  . TIA (transient ischemic attack)    09.15    Past Surgical History:  Procedure Laterality Date  . Fractured arm Left 12  . HERNIA REPAIR Right 3/12  . LAPAROSCOPIC REVISION VENTRICULAR-PERITONEAL (V-P) SHUNT N/A 08/26/2018   Procedure: LAPAROSCOPIC INSERTION VENTRICULAR-PERITONEAL (V-P) SHUNT;  Surgeon: Earnie Larsson, MD;  Location: [facility];  Service: Neurosurgery;  Laterality: N/A;  . LOOP RECORDER INSERTION N/A 04/10/2017   Procedure: Loop Recorder Insertion;  Surgeon: Thompson Grayer, MD;  Location: [facility];  Service: Cardiovascular;  Laterality: N/A;  . SHUNT REMOVAL Right 03/13/2016   Procedure: SHUNT REMOVAL;  Surgeon: Earnie Larsson, MD;  [facility]: [facility];  Service: Neurosurgery;  Laterality: Right;  . SHUNT REMOVAL Right 08/09/2018   Procedure: SHUNT REMOVAL With Placement of Ventricular Catheter;  Surgeon: Consuella Lose, MD;  Location: [facility];  Service: Neurosurgery;  Laterality: Right;  . SHUNT REVISION Right 08/05/2018   Procedure: SHUNT REVISION;  Surgeon: Earnie Larsson, MD;  Location: [facility];  Service: Neurosurgery;  Laterality: Right;  . SHUNT REVISION VENTRICULAR-PERITONEAL Left 08/26/2018    Procedure: SHUNT REVISION VENTRICULAR-PERITONEAL;  Surgeon: Earnie Larsson, MD;  Location: [facility];  Service: Neurosurgery;  Laterality: Left;  . SHUNT REVISION VENTRICULAR-PERITONEAL Left 09/02/2018   Procedure: Left Occipital VP shunt revision;  Surgeon: Earnie Larsson, MD;  Location: [facility];  Service: Neurosurgery;  Laterality: Left;  [name] VASECTOMY  10/02/1997  . VENTRICULOPERITONEAL SHUNT Right 12/18/2014   Procedure: Shunt Placment - right occipital VP shunt ;  Surgeon: Charlie Pitter, MD;  Location: [facility];  Service: Neurosurgery;  Laterality: Right;  Shunt Placment - right occipital VP shunt   . VENTRICULOPERITONEAL SHUNT Right 07/22/2018   Procedure: Shunt Placment right occipital;  Surgeon: Earnie Larsson, MD;  Location: [facility];  Service: Neurosurgery;  Laterality: Right;  . VENTRICULOPERITONEAL SHUNT Left 08/26/2018   Procedure: LEFT SIDED VENTRICULAR-PERITONEAL SHUNT;  Surgeon: Earnie Larsson, MD;  Location: [facility];  Service: Neurosurgery;  Laterality: Left;  [name] VENTRICULOSTOMY Right 08/09/2018   Procedure: VENTRICULOSTOMY;  Surgeon: Consuella Lose, MD;  Location: [facility];  Service: Neurosurgery;  Laterality: Right;    There were no vitals filed for this visit.  Subjective Assessment - 10/28/18 1405    Subjective  "The kids live next door with [place]."     Patient is accompained by:  Family member   daughter   Currently in Pain?  No/denies            ADULT SLP TREATMENT - 10/28/18 1402      General Information  Behavior/Cognition  Cooperative;Alert;Pleasant mood      Treatment Provided   Treatment provided  Cognitive-Linquistic      Pain Assessment   Pain Assessment  No/denies pain      Cognitive-Linquistic Treatment   Treatment focused on  Aphasia;Cognition    Skilled Treatment  SLP targeted pt's verbal and written expression by having pt generate lists of personally relevant words (favorite Christmas songs, basketball terms). Pt initially easily frustrated, however with question cues  from SLP and daughter for the first 4-5 terms, pt independently generated additional 7-8 words with extended time, and mod cues for written paraphasias/spelling/syntax errors. Using verb network strengthening treatment (VNeST), targeted word-finding and sentence production. Pt required mod question cues to generate unique subjects/objects for a chosen verb, as well as mod cues for alternating attention (recording on correct line). Daughter discussed communication breakdown which occurred earlier today; pt could not find the word [employer] but eventually conveyed his message by describing what he ate and when he goes there. SLP praised pt for this and encouraged him to continue using these compensations at home.       Assessment / Recommendations / Plan   Plan  Continue with current plan of care      Progression Toward Goals   Progression toward goals  Progressing toward goals         SLP Short Term Goals - 10/28/18 1701      SLP SHORT TERM GOAL #1   Title  Pt will complete standardized assessment of cognition.    Status  Achieved      SLP SHORT TERM GOAL #2   Title  Pt will ID object/picture to simple description/feature/function f:4 with occasional min A over 3 sessions     Status  Partially Met      SLP SHORT TERM GOAL #3   Title  Pt will demo auditory comprehension of 5 minutes simple-mod complex conversation by responding appropriately or asking questions over 2 sessions    Status  Achieved      SLP SHORT TERM GOAL #4   Title  Pt will name basic objects/pictures 7/10 correct with occasional mod A over 3 sessions     Status  Partially Met      SLP SHORT TERM GOAL #5   Title  Pt will demo sustained attention for 10 minutes in simple cognitive-linguistic task x3 sessions.    Status  On-going      SLP SHORT TERM GOAL #6   Title  Pt will have a memory system to assist in managing appointments, schedules, medical and therapy information and bring with him to 3 therapy sessions      Status  Achieved       SLP Long Term Goals - 10/28/18 1701      SLP LONG TERM GOAL #1   Title  Pt will write or name 5 items in a category with occasional min A over 4 sessions     Time  4    Period  Weeks   or 17 visits, for all LTGs   Status  On-going      SLP LONG TERM GOAL #2   Title  Pt will demo error awareness by attempting correction or by nonverbal response to errors 75% of the time over 3 sessions    Time  4    Period  Weeks    Status  On-going      SLP LONG TERM GOAL #3   Title  Pt will participate  functionally in 10 minutes simple-mod complex conversation with conversational supports for aphasia over 3 sessions.    Time  4    Period  Weeks    Status  On-going      SLP LONG TERM GOAL #4   Title  Pt will demo selective attention in min noisy environment for 10 minutes in a simple-mod complex cognitive linguistic task over three sessions    Time  4    Period  Weeks    Status  On-going      SLP LONG TERM GOAL #5   Title  Pt will utilize memory compensation system to recall details/manage appointments, schedules, medical and therapy information with rare min A over 4 sessions      Time  4    Period  Weeks    Status  On-going       Plan - 10/28/18 1700    Clinical Impression Statement  Mr. Specht continues to present with aphasia, attention and memory impairments. Significant improvements in auditory comprehension and verbal expression since last seen by this SLP; aphasia appears today to be more mild-moderate. With mod complex-complex conversation, pt continues with wordfinding difficulties and frequent paraphasias, of which his awareness is improving. Pt continues to exhibit anxiety and some frustration with errors. Continue skilled ST to maximize communication and cognition for wants/needs, safety, independence and QOL.    Speech Therapy Frequency  2x / week    Treatment/Interventions  Cognitive reorganization;Multimodal communcation approach;Environmental  controls;Compensatory strategies;Language facilitation;Compensatory techniques;Cueing hierarchy;Internal/external aids;Functional tasks;SLP instruction and feedback;Patient/family education    Potential to Achieve Goals  Good    Potential Considerations  Severity of impairments       Patient will benefit from skilled therapeutic intervention in order to improve the following deficits and impairments:   Aphasia  Cognitive communication deficit    Problem List Patient Active Problem List   Diagnosis Date Noted  . Visual disturbance   . Slow transit constipation   . Hypoalbuminemia due to protein-calorie malnutrition ([place])   . Acute blood loss anemia   . S/P VP shunt   . Hydrocephalus ([place]) 08/30/2018  . Dyslipidemia   . History of CVA (cerebrovascular accident)   . Benign essential HTN   . Tachycardia   . Leukocytosis   . Hyponatremia   . Hypokalemia   . 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 Infection of ventricular shunt ([place]) 08/09/2018  . Bacterial meningitis 08/09/2018  . TIA (transient ischemic attack) 01/29/2017  . Acute encephalopathy   . Shunt malfunction 03/13/2016  . 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 Hyperlipidemia 10/20/2014  . Degenerative disc disease, lumbar 04/15/2013  . Routine general medical examination at a health care facility 07/23/2012  . DISTURBANCE OF SKIN SENSATION 10/05/2008  . HYPERLIPIDEMIA 01/01/2008  . MYCOPLASMA PNEUMONIA 01/01/2008   Deneise Lever, [place], [place] 10/28/2018, 5:10 PM  [facility] [address] [place], [place], [address] Phone: [phone]   Fax:  [phone]   Name: Leiam Hopwood MRN: [id number] Date of Birth: [birth year]

## 2018-10-29 NOTE — Therapy (Signed)
[facility] [address] [place], [place], [address] Phone: [phone]   Fax:  [phone]  Physical Therapy Treatment  Patient Details  Name: Charles Marquez MRN: [id number] Date of Birth: [birth year] Referring Provider (PT): Dr. Allena Katz    Encounter Date: 10/28/2018  PT End of Session - 10/29/18 1059    Visit Number  12    Number of Visits  17    Date for PT Re-Evaluation  11/18/18    Authorization Type  BCBS OTHER     Authorization Time Period  PT/OT/ST/Hydro therapy covered x 90 days    PT Start Time  1450    PT Stop Time  1533    PT Time Calculation (min)  43 min    Equipment Utilized During Treatment  --   min A to S prn   Activity Tolerance  Patient tolerated treatment well    Behavior During Therapy  [facility] for tasks assessed/performed       Past Medical History:  Diagnosis Date  . Anxiety   . Hypercholesteremia   . Stroke ([facility])    tia's  . TIA (transient ischemic attack)    09.15    Past Surgical History:  Procedure Laterality Date  . Fractured arm Left 12  . HERNIA REPAIR Right 3/12  . LAPAROSCOPIC REVISION VENTRICULAR-PERITONEAL (V-P) SHUNT N/A 08/26/2018   Procedure: LAPAROSCOPIC INSERTION VENTRICULAR-PERITONEAL (V-P) SHUNT;  Surgeon: Julio Sicks, MD;  [facility];  Service: Neurosurgery;  Laterality: N/A;  . LOOP RECORDER INSERTION N/A 04/10/2017   Procedure: Loop Recorder Insertion;  Surgeon: Hillis Range, MD;  [facility];  Service: Cardiovascular;  Laterality: N/A;  . SHUNT REMOVAL Right 03/13/2016   Procedure: SHUNT REMOVAL;  Surgeon: Julio Sicks, MD;  [facility];  Service: Neurosurgery;  Laterality: Right;  . SHUNT REMOVAL Right 08/09/2018   Procedure: SHUNT REMOVAL With Placement of Ventricular Catheter;  Surgeon: Lisbeth Renshaw, MD;  Location: [facility];  Service: Neurosurgery;  Laterality: Right;  . SHUNT REVISION Right 08/05/2018   Procedure: SHUNT  REVISION;  Surgeon: Julio Sicks, MD;  Location: [facility];  Service: Neurosurgery;  Laterality: Right;  . SHUNT REVISION VENTRICULAR-PERITONEAL Left 08/26/2018   Procedure: SHUNT REVISION VENTRICULAR-PERITONEAL;  Surgeon: Julio Sicks, MD;  Location: [facility];  Service: Neurosurgery;  Laterality: Left;  . SHUNT REVISION VENTRICULAR-PERITONEAL Left 09/02/2018   Procedure: Left Occipital VP shunt revision;  Surgeon: Julio Sicks, MD;  Location: [facility];  Service: Neurosurgery;  Laterality: Left;  [name] VASECTOMY  10/02/1997  . 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 VENTRICULOPERITONEAL SHUNT Right 07/22/2018   Procedure: Shunt Placment right occipital;  Surgeon: Julio Sicks, MD;  Location: [facility] OR;  Service: Neurosurgery;  Laterality: Right;  . VENTRICULOPERITONEAL SHUNT Left 08/26/2018   Procedure: LEFT SIDED VENTRICULAR-PERITONEAL SHUNT;  Surgeon: Julio Sicks, MD;  Location: [facility] OR;  Service: Neurosurgery;  Laterality: Left;  [name] VENTRICULOSTOMY Right 08/09/2018   Procedure: VENTRICULOSTOMY;  Surgeon: Lisbeth Renshaw, MD;  Location: [facility] OR;  Service: Neurosurgery;  Laterality: Right;    There were no vitals filed for this visit.  Subjective Assessment - 10/28/18 1456    Subjective  No issues over the weekend; still having to pay attention to picking his feet up.  Daughter with pt  today.    Patient is accompained by:  Family member   wife: Charles BattenKim   Pertinent History   S/p VP shunt, Hydrocephalus, CVA with loop recorder insertion 04/10/2017 as well as R occipital VP shunt placement 2016, Infection of ventricular shunt, shunt malfunction, HLD, anxiety, TIA, Hypercholesteremia, acute blood loss anemia, HTN, Tachycardia, Leukocytosis, Hyponatremia, Bacterial Encephalitis, Bacterial Meningitis, Dyslipidemia, DDD lumbar  spine, and Mycoplasma Pneumonia.     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  No/denies                       [facility] Adult [facility] Treatment/Exercise - 10/28/18 1456      Neuro Re-ed    Neuro Re-ed Details   Tall kneeling on mat performed 10 reps tall kneel squats in mid line and then added 5lb medicine ball reaching up and over head.  Performed lateral tall kneeling squats performing arcs with 5lb ball x 10 reps      Exercises   Exercises  Lumbar      Lumbar Exercises: Quadruped   Straight Leg Raise  5 reps    Straight Leg Raises Limitations  straight leg raise with hamstring curl    Opposite Arm/Leg Raise  Right arm/Left leg;Left arm/Right leg;10 reps;2 seconds    Opposite Arm/Leg Raise Limitations  performed alternating and then holding UE and LE up and crunching to tap underneath the body.  Also performed alternating while taking UE and LE out in ABD x 3 reps each side.          Balance Exercises - 10/28/18 1600      Balance Exercises: Standing   SLS  Eyes open;Foam/compliant surface;Other reps (comment)    Other Standing Exercises  SLS balance training with coordination and sequencing alternating between two tasks while performing 4 sets x 8 reps cone taps on red mat. First set included lateral taps to same side while ambulating forwards and backwards; added in having pt hold 3# medicine ball overhead with contralateral UE for increased weight shift and core activation.  Performed to L and R.  Transitioned to performing alternating double taps forwards with squat side step while holding medicine ball in midline.  Pt demonstrated greatest difficulty with transitions between double taps and side squats and required ongoing cues to sequence.  Pt also continues to require cues to slow movement and control weight shift during SLS.        PT Education - 10/29/18 1059     Education Details  ongoing difficulty with dual tasking and alternating between tasks    Person(s) Educated  Patient;Child(ren)    Methods  Explanation    Comprehension  Verbalized understanding       PT Short Term Goals - 10/22/18 1127      PT SHORT TERM GOAL #1   Title  Pt will participate in establishment of initial HEP to improve strength, balance, and functional mobility.     Time  4    Period  Weeks    Status  Achieved      PT SHORT TERM GOAL #2   Title  Pt will ambulate 500 feet outdoors navigating uneven terrain, curbs, ramps, and perform dual cognitive tasks with therapist providing supervision to improve safety with community distances.     Time  4    Period  Weeks    Status  Achieved      PT SHORT TERM GOAL #3   Title  Pt will perform 12 steps with reciprocal stepping technique and single HR demonstrating improvement in safety with community accessibility with therapist providing supervision.     Time  4    Period  Weeks    Status  Achieved      PT SHORT TERM GOAL #4   Title  Pt will report no LOB episodes when walking over his gravel drive way indicating improvement in safety with functional mobility at home.    Time  4    Period  Weeks    Status  Achieved      PT SHORT TERM GOAL #5   Title  Pt will improve FGA score to >/= 20/30 indicating improvement in functional mobility.     Baseline  22/30 on 10/18/18    Time  4    Period  Weeks    Status  Achieved        PT Long Term Goals - 09/19/18 1530      PT LONG TERM GOAL #1   Title  Pt will be independent and compliant with performing is HEP to improve balance, strength, and functional mobility.    Time  8    Period  Weeks    Status  New    Target Date  11/18/18      PT LONG TERM GOAL #2   Title  Pt will ambulate 1000 feet outdoors at mod I level navigating uneven terrain, curbs, ramps, and perform dual cognitive tasks to increase safety with community distances.     Time  8    Period  Weeks    Status   New    Target Date  11/18/18      PT LONG TERM GOAL #3   Title  Pt will demonstrate 12 steps with no HR's at mod I level with good safety awareness to improve community accessibility.     Time  8    Period  Weeks    Status  New    Target Date  11/18/18      PT LONG TERM GOAL #4   Title  Pt will improve FGA score to >/=25/30 indicating improvement in functional mobility.     Time  8    Period  Weeks    Status  New    Target Date  11/18/18            Plan - 10/29/18 1100    Clinical Impression Statement  Continued to focus on proximal stability and core strengthening in quadruped and tall kneeling with addition of weighted medicine ball for increased activation and motor control.  Transitioned to dynamic standing balance with continued focuse on control of weight shift, SLS and foot clearance. With multiple directions and alternating tasks pt demonstrated greater difficulty performing safely.  Will continue to address and progress towards LTG.    Rehab Potential  Good    Clinical Impairments Affecting Rehab Potential  Pt has short term memory deficits and cognitive changes from baseline which may negatively influence his ability to recall exercises and progressions in therapy.     PT Frequency  2x / week    PT Duration  8 weeks    PT Treatment/Interventions  ADLs/Self Care Home Management;Therapeutic activities;Dry needling;Therapeutic exercise;Balance training;Neuromuscular re-education;Manual techniques;Visual/perceptual remediation/compensation;Patient/family education;Stair training;Gait training;Electrical Stimulation;Functional mobility training;Passive range of motion;DME Instruction    PT Next Visit Plan  Continue cone taps on mat, dual  tasking, increasing gait speed forwards/backwards/sudden stops/head turns to simulate officiating basketball.  high level balance and strengthening in quadruped/ tall kneeling.  Avoid activities where he is bending forwards/head down    PT Home  Exercise Plan  [id number]     Consulted and Agree with Plan of Care  Patient;Family member/caregiver    Family Member Consulted  wife- kim        Patient will benefit from skilled therapeutic intervention in order to improve the following deficits and impairments:  Abnormal gait, Decreased coordination, Decreased safety awareness, Impaired vision/preception, Decreased balance, Decreased cognition, Decreased mobility, Decreased strength  Visit Diagnosis: Muscle weakness (generalized)  Unsteadiness on feet  Other abnormalities of gait and mobility     Problem List Patient Active Problem List   Diagnosis Date Noted  . Visual disturbance   . Slow transit constipation   . Hypoalbuminemia due to protein-calorie malnutrition ([facility])   . Acute blood loss anemia   . S/P VP shunt   . Hydrocephalus ([facility]) 08/30/2018  . Dyslipidemia   . History of CVA (cerebrovascular accident)   . Benign essential HTN   . Tachycardia   . Leukocytosis   . Hyponatremia   . Hypokalemia   . 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 Bacterial meningitis 08/09/2018  . TIA (transient ischemic attack) 01/29/2017  . Acute encephalopathy   . Shunt malfunction 03/13/2016  . 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 Hyperlipidemia 10/20/2014  . Degenerative disc disease, lumbar 04/15/2013  . Routine general medical examination at a health care facility 07/23/2012  . DISTURBANCE OF SKIN SENSATION 10/05/2008  . HYPERLIPIDEMIA 01/01/2008  . MYCOPLASMA PNEUMONIA 01/01/2008   Dierdre Highman, PT, DPT 10/29/18    11:08 AM    [facility] [address] [place], [place], [address] Phone: [phone]   Fax:  [phone]  Name: Charles Marquez MRN: [id number] Date of Birth: 28-Mar-1956

## 2018-10-31 ENCOUNTER — Ambulatory Visit: Payer: BLUE CROSS/BLUE SHIELD | Admitting: Speech Pathology

## 2018-10-31 ENCOUNTER — Ambulatory Visit: Payer: BLUE CROSS/BLUE SHIELD

## 2018-10-31 DIAGNOSIS — R4701 Aphasia: Secondary | ICD-10-CM

## 2018-10-31 DIAGNOSIS — R41841 Cognitive communication deficit: Secondary | ICD-10-CM

## 2018-10-31 DIAGNOSIS — R2689 Other abnormalities of gait and mobility: Secondary | ICD-10-CM

## 2018-10-31 DIAGNOSIS — R41842 Visuospatial deficit: Secondary | ICD-10-CM | POA: Diagnosis not present

## 2018-10-31 DIAGNOSIS — R2681 Unsteadiness on feet: Secondary | ICD-10-CM

## 2018-10-31 NOTE — Therapy (Signed)
[facility] [address] [place], [place], [address] Phone: [phone]   Fax:  [phone]  Physical Therapy Treatment  Patient Details  Name: Charles Marquez MRN: [id number] Date of Birth: Jun 26, 1956 Referring Provider (PT): Dr. Allena Katz    Encounter Date: 10/31/2018  PT End of Session - 10/31/18 1624    Visit Number  13    Number of Visits  17    Date for PT Re-Evaluation  11/18/18    Authorization Type  BCBS OTHER     Authorization Time Period  PT/OT/ST/Hydro therapy covered x 90 days    PT Start Time  1534   pt with speech   PT Stop Time  1612    PT Time Calculation (min)  38 min    Equipment Utilized During Treatment  Gait belt    Activity Tolerance  Patient tolerated treatment well    Behavior During Therapy  WFL for tasks assessed/performed       Past Medical History:  Diagnosis Date  . Anxiety   . Hypercholesteremia   . Stroke (HCC)    tia's  . TIA (transient ischemic attack)    09.15    Past Surgical History:  Procedure Laterality Date  . Fractured arm Left 12  . HERNIA REPAIR Right 3/12  . LAPAROSCOPIC REVISION VENTRICULAR-PERITONEAL (V-P) SHUNT N/A 08/26/2018   Procedure: LAPAROSCOPIC INSERTION VENTRICULAR-PERITONEAL (V-P) SHUNT;  Surgeon: Julio Sicks, MD;  Location: [facility] OR;  Service: Neurosurgery;  Laterality: N/A;  . LOOP RECORDER INSERTION N/A 04/10/2017   Procedure: Loop Recorder Insertion;  Surgeon: Hillis Range, MD;  Location: [facility] INVASIVE CV LAB;  Service: Cardiovascular;  Laterality: N/A;  . SHUNT REMOVAL Right 03/13/2016   Procedure: SHUNT REMOVAL;  Surgeon: Julio Sicks, MD;  Location: [facility] NEURO ORS;  Service: Neurosurgery;  Laterality: Right;  . SHUNT REMOVAL Right 08/09/2018   Procedure: SHUNT REMOVAL With Placement of Ventricular Catheter;  Surgeon: Lisbeth Renshaw, MD;  Location: [facility] OR;  Service: Neurosurgery;  Laterality: Right;  . SHUNT REVISION Right 08/05/2018   Procedure: SHUNT  REVISION;  Surgeon: Julio Sicks, MD;  Location: [facility] OR;  Service: Neurosurgery;  Laterality: Right;  . SHUNT REVISION VENTRICULAR-PERITONEAL Left 08/26/2018   Procedure: SHUNT REVISION VENTRICULAR-PERITONEAL;  Surgeon: Julio Sicks, MD;  Location: [facility] OR;  Service: Neurosurgery;  Laterality: Left;  . SHUNT REVISION VENTRICULAR-PERITONEAL Left 09/02/2018   Procedure: Left Occipital VP shunt revision;  Surgeon: Julio Sicks, MD;  Location: [facility] OR;  Service: Neurosurgery;  Laterality: Left;  [name] VASECTOMY  10/02/1997  . 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 VENTRICULOPERITONEAL SHUNT Right 07/22/2018   Procedure: Shunt Placment right occipital;  Surgeon: Julio Sicks, MD;  Location: [facility] OR;  Service: Neurosurgery;  Laterality: Right;  . VENTRICULOPERITONEAL SHUNT Left 08/26/2018   Procedure: LEFT SIDED VENTRICULAR-PERITONEAL SHUNT;  Surgeon: Julio Sicks, MD;  Location: [facility] OR;  Service: Neurosurgery;  Laterality: Left;  [name] VENTRICULOSTOMY Right 08/09/2018   Procedure: VENTRICULOSTOMY;  Surgeon: Lisbeth Renshaw, MD;  Location: [facility] OR;  Service: Neurosurgery;  Laterality: Right;    There were no vitals filed for this visit.  Subjective Assessment - 10/31/18 1537    Subjective  Pt denied falls or changes since last visit. Pt has ophthamalogist tomorrow. [name] reported she has noticed pt experiences  incr. postural sway and veering to one side when amb. at night, in the dark (using bathroom).     Patient is accompained by:  Family member   Kim: wife   Pertinent History   S/p VP shunt, Hydrocephalus, CVA with loop recorder insertion 04/10/2017 as well as R occipital VP shunt placement 2016, Infection of ventricular shunt, shunt malfunction, HLD, anxiety, TIA, Hypercholesteremia, acute blood loss anemia, HTN,  Tachycardia, Leukocytosis, Hyponatremia, Bacterial Encephalitis, Bacterial Meningitis, Dyslipidemia, DDD lumbar spine, and Mycoplasma Pneumonia.     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  No/denies                            Balance Exercises - 10/31/18 1621      Balance Exercises: Standing   Rockerboard  Anterior/posterior;Lateral;Head turns;EO;EC;10 seconds;30 seconds;10 reps;Intermittent UE support;Other reps (comment)   1-3 reps with static stance activiites, x10 reps c head turn   Other Standing Exercises  Pt performed in // bars with min gaurd to min A for safety. Intermittent UE support. Cues and demo for technique (improved weight shifting from hips vs. trunk during LOB). Pt also performed single and crossover (double) cone taps with board in ant/post direction x30 reps total.           PT Short Term Goals - 10/22/18 1127      PT SHORT TERM GOAL #1   Title  Pt will participate in establishment of initial HEP to improve strength, balance, and functional mobility.     Time  4    Period  Weeks    Status  Achieved      PT SHORT TERM GOAL #2   Title  Pt will ambulate 500 feet outdoors navigating uneven terrain, curbs, ramps, and perform dual cognitive tasks with therapist providing supervision to improve safety with community distances.     Time  4    Period  Weeks    Status  Achieved      PT SHORT TERM GOAL #3   Title  Pt will perform 12 steps with reciprocal stepping technique and single HR demonstrating improvement in safety with community accessibility with therapist providing supervision.     Time  4    Period  Weeks    Status  Achieved      PT SHORT TERM GOAL #4   Title  Pt will report no LOB episodes when walking over his gravel drive way indicating improvement in safety with functional mobility at home.    Time  4    Period  Weeks    Status   Achieved      PT SHORT TERM GOAL #5   Title  Pt will improve FGA score to >/= 20/30 indicating improvement in functional mobility.     Baseline  22/30 on 10/18/18    Time  4    Period  Weeks    Status  Achieved        PT Long Term Goals - 09/19/18 1530      PT LONG TERM GOAL #1   Title  Pt will be independent and compliant with performing is HEP to improve balance, strength, and functional mobility.    Time  8    Period  Weeks    Status  New    Target Date  11/18/18  PT LONG TERM GOAL #2   Title  Pt will ambulate 1000 feet outdoors at mod I level navigating uneven terrain, curbs, ramps, and perform dual cognitive tasks to increase safety with community distances.     Time  8    Period  Weeks    Status  New    Target Date  11/18/18      PT LONG TERM GOAL #3   Title  Pt will demonstrate 12 steps with no HR's at mod I level with good safety awareness to improve community accessibility.     Time  8    Period  Weeks    Status  New    Target Date  11/18/18      PT LONG TERM GOAL #4   Title  Pt will improve FGA score to >/=25/30 indicating improvement in functional mobility.     Time  8    Period  Weeks    Status  New    Target Date  11/18/18            Plan - 10/31/18 1624    Clinical Impression Statement  Today's skilled session focused on improving balance and vestibular input based on pt's wife subjective report of pt experiencing incr. postural sway and veering to one side during amb. in the dark (when going to the bathroom). Pt progressed from 1-2 UE support in // bars to no UE support. Pt also able to progress from constant min A to maintain balance to intermittent min A with min guard for safety. Pt experienced incr. postural sway during balance activities which required incr. vestibular input (eyes closed and head turns). Continue with POC.     Rehab Potential  Good    Clinical Impairments Affecting Rehab Potential  Pt has short term memory deficits and  cognitive changes from baseline which may negatively influence his ability to recall exercises and progressions in therapy.     PT Frequency  2x / week    PT Duration  8 weeks    PT Treatment/Interventions  ADLs/Self Care Home Management;Therapeutic activities;Dry needling;Therapeutic exercise;Balance training;Neuromuscular re-education;Manual techniques;Visual/perceptual remediation/compensation;Patient/family education;Stair training;Gait training;Electrical Stimulation;Functional mobility training;Passive range of motion;DME Instruction    PT Next Visit Plan  Continue cone taps on mat, dual tasking, increasing gait speed forwards/backwards/sudden stops/head turns to simulate officiating basketball.  high level balance and strengthening in quadruped/ tall kneeling.  Avoid activities where he is bending forwards/head down    PT Home Exercise Plan  [id number]     Consulted and Agree with Plan of Care  Patient;Family member/caregiver    Family Member Consulted  wife- kim        Patient will benefit from skilled therapeutic intervention in order to improve the following deficits and impairments:  Abnormal gait, Decreased coordination, Decreased safety awareness, Impaired vision/preception, Decreased balance, Decreased cognition, Decreased mobility, Decreased strength  Visit Diagnosis: Unsteadiness on feet  Other abnormalities of gait and mobility     Problem List Patient Active Problem List   Diagnosis Date Noted  . Visual disturbance   . Slow transit constipation   . Hypoalbuminemia due to protein-calorie malnutrition (HCC)   . Acute blood loss anemia   . S/P VP shunt   . Hydrocephalus (HCC) 08/30/2018  . Dyslipidemia   . History of CVA (cerebrovascular accident)   . Benign essential HTN   . Tachycardia   . Leukocytosis   . Hyponatremia   . Hypokalemia   . 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 Infection of  ventricular shunt (HCC) 08/09/2018  . Bacterial meningitis 08/09/2018  .  TIA (transient ischemic attack) 01/29/2017  . Acute encephalopathy   . Shunt malfunction 03/13/2016  . 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 Hyperlipidemia 10/20/2014  . Degenerative disc disease, lumbar 04/15/2013  . Routine general medical examination at a health care facility 07/23/2012  . DISTURBANCE OF SKIN SENSATION 10/05/2008  . HYPERLIPIDEMIA 01/01/2008  . MYCOPLASMA PNEUMONIA 01/01/2008    Joleena Weisenburger L 10/31/2018, 4:27 PM  [facility] [address] [place], [place], [address] Phone: [phone]   Fax:  [phone]  Name: Charles Marquez MRN: [id number] Date of Birth: 1956/06/05  Zerita BoersJennifer Bryton Waight, PT,DPT 10/31/18 4:28 PM Phone: [phone] Fax: [phone]

## 2018-10-31 NOTE — Therapy (Signed)
[facility] [address] [place], [place], [address] Phone: [phone]   Fax:  [phone]  Speech Language Pathology Treatment  Patient Details  Name: Charles Marquez MRN: [id number] Date of Birth: 07-06-1956 Referring Provider (SLP): Dr. Posey Pronto   Encounter Date: 10/31/2018  End of Session - 10/31/18 1605    Visit Number  11    Number of Visits  17    Date for SLP Re-Evaluation  11/18/18    Authorization Type  BCBS 90 visits for all disciplines     SLP Start Time  1447    SLP Stop Time   1534    SLP Time Calculation (min)  47 min    Activity Tolerance  Patient tolerated treatment well       Past Medical History:  Diagnosis Date  . Anxiety   . Hypercholesteremia   . Stroke ([place])    tia's  . TIA (transient ischemic attack)    09.15    Past Surgical History:  Procedure Laterality Date  . Fractured arm Left 12  . HERNIA REPAIR Right 3/12  . LAPAROSCOPIC REVISION VENTRICULAR-PERITONEAL (V-P) SHUNT N/A 08/26/2018   Procedure: LAPAROSCOPIC INSERTION VENTRICULAR-PERITONEAL (V-P) SHUNT;  Surgeon: Earnie Larsson, MD;  Location: [facility];  Service: Neurosurgery;  Laterality: N/A;  . LOOP RECORDER INSERTION N/A 04/10/2017   Procedure: Loop Recorder Insertion;  Surgeon: Thompson Grayer, MD;  Location: [place] CV LAB;  Service: Cardiovascular;  Laterality: N/A;  . SHUNT REMOVAL Right 03/13/2016   Procedure: SHUNT REMOVAL;  Surgeon: Earnie Larsson, MD;  Location: [facility] NEURO ORS;  Service: Neurosurgery;  Laterality: Right;  . SHUNT REMOVAL Right 08/09/2018   Procedure: SHUNT REMOVAL With Placement of Ventricular Catheter;  Surgeon: Consuella Lose, MD;  Location: [place];  Service: Neurosurgery;  Laterality: Right;  . SHUNT REVISION Right 08/05/2018   Procedure: SHUNT REVISION;  Surgeon: Earnie Larsson, MD;  Location: [facility];  Service: Neurosurgery;  Laterality: Right;  . SHUNT REVISION VENTRICULAR-PERITONEAL Left 08/26/2018   Procedure: SHUNT REVISION VENTRICULAR-PERITONEAL;  Surgeon: Earnie Larsson, MD;  Location: [place];  Service: Neurosurgery;  Laterality: Left;  . SHUNT REVISION VENTRICULAR-PERITONEAL Left 09/02/2018   Procedure: Left Occipital VP shunt revision;  Surgeon: Earnie Larsson, MD;  Location: [facility];  Service: Neurosurgery;  Laterality: Left;  [name] VASECTOMY  10/02/1997  . VENTRICULOPERITONEAL SHUNT Right 12/18/2014   Procedure: Shunt Placment - right occipital VP shunt ;  Surgeon: Charlie Pitter, MD;  Location: [place] NEURO ORS;  Service: Neurosurgery;  Laterality: Right;  Shunt Placment - right occipital VP shunt   . VENTRICULOPERITONEAL SHUNT Right 07/22/2018   Procedure: Shunt Placment right occipital;  Surgeon: Earnie Larsson, MD;  Location: [facility];  Service: Neurosurgery;  Laterality: Right;  . VENTRICULOPERITONEAL SHUNT Left 08/26/2018   Procedure: LEFT SIDED VENTRICULAR-PERITONEAL SHUNT;  Surgeon: Earnie Larsson, MD;  Location: [place];  Service: Neurosurgery;  Laterality: Left;  [name] VENTRICULOSTOMY Right 08/09/2018   Procedure: VENTRICULOSTOMY;  Surgeon: Consuella Lose, MD;  Location: [facility] [place];  Service: Neurosurgery;  Laterality: Right;    There were no vitals filed for this visit.  Subjective Assessment - 10/31/18 1452    Subjective  Make it what you g, make it what you can.  (Pended)             ADULT SLP TREATMENT - 10/31/18 1447      General Information   Behavior/Cognition  Cooperative;Alert;Pleasant mood      Treatment Provided   Treatment provided  Cognitive-Linquistic      Pain Assessment   Pain Assessment  No/denies pain      Cognitive-Linquistic Treatment   Treatment focused on  Aphasia    Skilled Treatment  [name] reports pt is having difficulty with daily "journal" because "he thinks it needs to be perfect." SLP educated pt re: role of anxiety/pressures of perfection and need to make efforts to "just get something out" even if he is afraid it will be incorrect. Throughout session, when pt  hesitated or held back, when encouraged to say or write something, he was able to relay his message with occasional questions for clarification. SLP used this to reinforce/praise pt for effective communication of his message. Written expression/naming in functional task; pt listed his 5 grandchildren's names (min A x1 for spelling) when writing a Christmas list, listed 6 of their hobbies and interests, and 8 ideas for Christmas gifts. In simple-mod complex conversation, pt made several corrections ("doctor"/daughter), and made attempts to correct paraphasias or non-verbal response to errors ~80% of the time. Pt independently gestured to written keywords to reference earlier points in the conversation when experiencing anomia. [name] asking for more tasks at home. Encouraged pt to think of his journal as a "daily log" vs needing to be creative. Pt agreed this would be helpful. Also encouraged pt to try writing some of his favorite recipes (ingredients and instructions).       Assessment / Recommendations / Plan   Plan  Continue with current plan of care      Progression Toward Goals   Progression toward goals  Progressing toward goals       SLP Education - 10/31/18 1604    Education Details  speech/writing doesn't have to be perfect; message is more important than the words    Person(s) Educated  Patient;Spouse    Methods  Explanation;Demonstration;Verbal cues    Comprehension  Verbalized understanding;Returned demonstration;Need further instruction       SLP Short Term Goals - 10/31/18 1544      SLP SHORT TERM GOAL #1   Title  Pt will complete standardized assessment of cognition.    Status  Achieved      SLP SHORT TERM GOAL #2   Title  Pt will ID object/picture to simple description/feature/function f:4 with occasional min A over 3 sessions     Status  Partially Met      SLP SHORT TERM GOAL #3   Title  Pt will demo auditory comprehension of 5 minutes simple-mod complex conversation  by responding appropriately or asking questions over 2 sessions    Status  Achieved      SLP SHORT TERM GOAL #4   Title  Pt will name basic objects/pictures 7/10 correct with occasional mod A over 3 sessions     Status  Partially Met      SLP SHORT TERM GOAL #5   Title  Pt will demo sustained attention for 10 minutes in simple cognitive-linguistic task x3 sessions.    Status  On-going      SLP SHORT TERM GOAL #6   Title  Pt will have a memory system to assist in managing appointments, schedules, medical and therapy information and bring with him to 3 therapy sessions     Status  Achieved       SLP Long Term Goals - 10/31/18 1544      SLP LONG TERM GOAL #1   Title  Pt will write or name 5 items in a  category with occasional min A over 4 sessions     Baseline  10/31/18    Time  4    Period  Weeks   or 17 visits, for all LTGs   Status  On-going      SLP LONG TERM GOAL #2   Title  Pt will demo error awareness by attempting correction or by nonverbal response to errors 75% of the time over 3 sessions    Baseline  10/17/18, 10/31/18    Time  4    Period  Weeks    Status  On-going      SLP LONG TERM GOAL #3   Title  Pt will participate functionally in 10 minutes simple-mod complex conversation with conversational supports for aphasia over 3 sessions.    Baseline  10/17/18, 10/31/18    Time  4    Period  Weeks    Status  On-going      SLP LONG TERM GOAL #4   Title  Pt will demo selective attention in min noisy environment for 10 minutes in a simple-mod complex cognitive linguistic task over three sessions    Time  4    Period  Weeks    Status  On-going      SLP LONG TERM GOAL #5   Title  Pt will utilize memory compensation system to recall details/manage appointments, schedules, medical and therapy information with rare min A over 4 sessions      Time  4    Period  Weeks    Status  On-going       Plan - 10/31/18 1606    Clinical Impression Statement  Charles Marquez continues  to present with aphasia, attention and memory impairments. Significant improvements in auditory comprehension and verbal expression since last seen by this SLP; aphasia appears today to be more mild-moderate. With mod complex-complex conversation, pt continues with wordfinding difficulties and frequent paraphasias, of which his awareness is improving. Pt continues to exhibit anxiety and some frustration with errors. Continue skilled ST to maximize communication and cognition for wants/needs, safety, independence and QOL.    Speech Therapy Frequency  2x / week    Duration  --   8 weeks or 17 visits   Treatment/Interventions  Cognitive reorganization;Multimodal communcation approach;Environmental controls;Compensatory strategies;Language facilitation;Compensatory techniques;Cueing hierarchy;Internal/external aids;Functional tasks;SLP instruction and feedback;Patient/family education    Potential to Achieve Goals  Good    Potential Considerations  Severity of impairments       Patient will benefit from skilled therapeutic intervention in order to improve the following deficits and impairments:   Aphasia  Cognitive communication deficit    Problem List Patient Active Problem List   Diagnosis Date Noted  . Visual disturbance   . Slow transit constipation   . Hypoalbuminemia due to protein-calorie malnutrition ([place])   . Acute blood loss anemia   . S/P VP shunt   . Hydrocephalus ([place]) 08/30/2018  . Dyslipidemia   . History of CVA (cerebrovascular accident)   . Benign essential HTN   . Tachycardia   . Leukocytosis   . Hyponatremia   . Hypokalemia   . 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 Infection of ventricular shunt ([place]) 08/09/2018  . Bacterial meningitis 08/09/2018  . TIA (transient ischemic attack) 01/29/2017  . Acute encephalopathy   . Shunt malfunction 03/13/2016  . 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 Communicating hydrocephalus ([place]) 12/18/2014  .  Hyperlipidemia 10/20/2014  . Degenerative disc disease, lumbar 04/15/2013  . Routine general medical examination  at a health care facility 07/23/2012  . DISTURBANCE OF SKIN SENSATION 10/05/2008  . HYPERLIPIDEMIA 01/01/2008  . MYCOPLASMA PNEUMONIA 01/01/2008   Deneise Lever, [place], [place] 10/31/2018, 4:06 PM  [facility] Mar[facility] [address] [place], [place], [address] Phone: [phone]   Fax:  [phone]   Name: Charles Marquez MRN: [id number] Date of Birth: 27-Jun-1956

## 2018-11-01 ENCOUNTER — Telehealth: Payer: Self-pay | Admitting: Physical Medicine & Rehabilitation

## 2018-11-01 ENCOUNTER — Ambulatory Visit (INDEPENDENT_AMBULATORY_CARE_PROVIDER_SITE_OTHER): Payer: BLUE CROSS/BLUE SHIELD

## 2018-11-01 DIAGNOSIS — I639 Cerebral infarction, unspecified: Secondary | ICD-10-CM | POA: Diagnosis not present

## 2018-11-01 NOTE — Telephone Encounter (Signed)
Patient wants to see JR - advised not an issue but will need referral.  She does not have to see Ankit Allena KatzPatel - [facility] is not accepting new patients, but Primary or Neurologist can refer - advised wait time - but we will be glad to help

## 2018-11-04 ENCOUNTER — Ambulatory Visit: Payer: BLUE CROSS/BLUE SHIELD | Admitting: Speech Pathology

## 2018-11-04 ENCOUNTER — Ambulatory Visit: Payer: BLUE CROSS/BLUE SHIELD | Admitting: Physical Therapy

## 2018-11-04 DIAGNOSIS — R2689 Other abnormalities of gait and mobility: Secondary | ICD-10-CM

## 2018-11-04 DIAGNOSIS — M6281 Muscle weakness (generalized): Secondary | ICD-10-CM

## 2018-11-04 DIAGNOSIS — R41842 Visuospatial deficit: Secondary | ICD-10-CM | POA: Diagnosis not present

## 2018-11-04 DIAGNOSIS — R2681 Unsteadiness on feet: Secondary | ICD-10-CM

## 2018-11-04 DIAGNOSIS — R41841 Cognitive communication deficit: Secondary | ICD-10-CM

## 2018-11-04 DIAGNOSIS — R4701 Aphasia: Secondary | ICD-10-CM

## 2018-11-04 NOTE — Therapy (Signed)
[facility] [address] [place], [place], [address] Phone: [phone]   Fax:  [phone]  Physical Therapy Treatment  Patient Details  Name: Charles Marquez MRN: [id number] Date of Birth: 29-Nov-1955 Referring Provider (PT): Dr. Allena KatzPatel    Encounter Date: 11/04/2018  PT End of Session - 11/04/18 2213    Visit Number  14    Number of Visits  17    Date for PT Re-Evaluation  11/18/18    Authorization Type  BCBS OTHER     Authorization Time Period  PT/OT/ST/Hydro therapy covered x 90 days    PT Start Time  1535    PT Stop Time  1615    PT Time Calculation (min)  40 min    Equipment Utilized During Treatment  Gait belt    Activity Tolerance  Patient tolerated treatment well    Behavior During Therapy  WFL for tasks assessed/performed       Past Medical History:  Diagnosis Date  . Anxiety   . Hypercholesteremia   . Stroke (HCC)    tia's  . TIA (transient ischemic attack)    09.15    Past Surgical History:  Procedure Laterality Date  . Fractured arm Left 12  . HERNIA REPAIR Right 3/12  . LAPAROSCOPIC REVISION VENTRICULAR-PERITONEAL (V-P) SHUNT N/A 08/26/2018   Procedure: LAPAROSCOPIC INSERTION VENTRICULAR-PERITONEAL (V-P) SHUNT;  Surgeon: Julio SicksPool, Henry, MD;  [facility]: [facility] OR;  Service: Neurosurgery;  Laterality: N/A;  . LOOP RECORDER INSERTION N/A 04/10/2017   Procedure: Loop Recorder Insertion;  Surgeon: Hillis RangeAllred, James, MD;  Location: [facility] INVASIVE CV LAB;  Service: Cardiovascular;  Laterality: N/A;  . SHUNT REMOVAL Right 03/13/2016   Procedure: SHUNT REMOVAL;  Surgeon: Julio SicksHenry Pool, MD;  Location: [facility] NEURO ORS;  Service: Neurosurgery;  Laterality: Right;  . SHUNT REMOVAL Right 08/09/2018   Procedure: SHUNT REMOVAL With Placement of Ventricular Catheter;  Surgeon: Lisbeth RenshawNundkumar, Neelesh, MD;  [facility];  Service: Neurosurgery;  Laterality: Right;  . SHUNT REVISION Right 08/05/2018   Procedure: SHUNT REVISION;   Surgeon: Julio SicksPool, Henry, MD;  [facility];  Service: Neurosurgery;  Laterality: Right;  . SHUNT REVISION VENTRICULAR-PERITONEAL Left 08/26/2018   Procedure: SHUNT REVISION VENTRICULAR-PERITONEAL;  Surgeon: Julio SicksPool, Henry, MD;  [facility];  Service: Neurosurgery;  Laterality: Left;  . SHUNT REVISION VENTRICULAR-PERITONEAL Left 09/02/2018   Procedure: Left Occipital VP shunt revision;  Surgeon: Julio SicksPool, Henry, MD;  [facility];  Service: Neurosurgery;  Laterality: Left;  [name]. VASECTOMY  10/02/1997  . VENTRICULOPERITONEAL SHUNT Right 12/18/2014   Procedure: Shunt Placment - right occipital VP shunt ;  Surgeon: Temple PaciniHenry A Pool, MD;  [facility] NEURO [facility]S;  Service: Neurosurgery;  Laterality: Right;  Shunt Placment - right occipital VP shunt   . VENTRICULOPERITONEAL SHUNT Right 07/22/2018   Procedure: Shunt Placment right occipital;  Surgeon: Julio SicksPool, Henry, MD;  [facility];  Service: Neurosurgery;  Laterality: Right;  . VENTRICULOPERITONEAL SHUNT Left 08/26/2018   Procedure: LEFT SIDED VENTRICULAR-PERITONEAL SHUNT;  Surgeon: Julio SicksPool, Henry, MD;  [facility];  Service: Neurosurgery;  Laterality: Left;  [name]. VENTRICULOSTOMY Right 08/09/2018   Procedure: VENTRICULOSTOMY;  Surgeon: Lisbeth RenshawNundkumar, Neelesh, MD;  [facility];  Service: Neurosurgery;  Laterality: Right;    There were no vitals filed for this visit.  Subjective Assessment - 11/04/18 1543    Subjective  No significant changes or findings at eye doctor.  Asking about the plan for January when he runs out of visits.  Patient is accompained by:  Family member   Kim: wife   Pertinent History   S/p VP shunt, Hydrocephalus, CVA with loop recorder insertion 04/10/2017 as well as R occipital VP shunt placement 2016, Infection of ventricular shunt, shunt malfunction, HLD, anxiety, TIA, Hypercholesteremia, acute blood loss anemia, HTN, Tachycardia, Leukocytosis, Hyponatremia, Bacterial Encephalitis, Bacterial Meningitis, Dyslipidemia, DDD lumbar spine, and  Mycoplasma Pneumonia.     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  No/denies                            Balance Exercises - 11/04/18 1557      Balance Exercises: Standing   Standing Eyes Closed  Narrow base of support (BOS);Head turns;Solid surface;Other reps (comment)   holding and then with head turns/nods   Tandem Stance  Eyes open;Intermittent upper extremity support;4 reps;10 secs    Wall Bumps  Hip    Wall Bumps-Hips  Eyes opened;Eyes closed;Anterior/posterior;10 reps   2 sets   Tandem Gait  Forward;Upper extremity support;4 reps      OTAGO PROGRAM   Heel Walking  Support   x 4 laps, counter support   Toe Walk  Support   x 4 laps; counter for support       PT Education - 11/04/18 2205    Education Details  plan for recertification after next week; return to strengthening ankles with HEP    Person(s) Educated  Patient;Spouse    Methods  Explanation;Demonstration    Comprehension  Verbalized understanding;Returned demonstration       PT Short Term Goals - 10/22/18 1127      PT SHORT TERM GOAL #1   Title  Pt will participate in establishment of initial HEP to improve strength, balance, and functional mobility.     Time  4    Period  Weeks    Status  Achieved      PT SHORT TERM GOAL #2   Title  Pt will ambulate 500 feet outdoors navigating uneven terrain, curbs, ramps, and perform dual cognitive tasks with therapist providing supervision to improve safety with community distances.     Time  4    Period  Weeks    Status  Achieved      PT SHORT TERM GOAL #3   Title  Pt will perform 12 steps with reciprocal stepping technique and single HR demonstrating improvement in safety with community accessibility with therapist providing supervision.     Time  4    Period  Weeks    Status  Achieved      PT SHORT TERM GOAL #4   Title  Pt will report no  LOB episodes when walking over his gravel drive way indicating improvement in safety with functional mobility at home.    Time  4    Period  Weeks    Status  Achieved      PT SHORT TERM GOAL #5   Title  Pt will improve FGA score to >/= 20/30 indicating improvement in functional mobility.     Baseline  22/30 on 10/18/18    Time  4    Period  Weeks    Status  Achieved        PT Long Term Goals - 09/19/18 1530      PT LONG TERM GOAL #1   Title  Pt will be independent and compliant with performing is HEP to improve balance, strength, and functional mobility.    Time  8    Period  Weeks    Status  New    Target Date  11/18/18      PT LONG TERM GOAL #2   Title  Pt will ambulate 1000 feet outdoors at mod I level navigating uneven terrain, curbs, ramps, and perform dual cognitive tasks to increase safety with community distances.     Time  8    Period  Weeks    Status  New    Target Date  11/18/18      PT LONG TERM GOAL #3   Title  Pt will demonstrate 12 steps with no HR's at mod I level with good safety awareness to improve community accessibility.     Time  8    Period  Weeks    Status  New    Target Date  11/18/18      PT LONG TERM GOAL #4   Title  Pt will improve FGA score to >/=25/30 indicating improvement in functional mobility.     Time  8    Period  Weeks    Status  New    Target Date  11/18/18            Plan - 11/04/18 2213    Clinical Impression Statement  Treatment session today focused on continued balance and balance reaction training with and without use of vision; increased focus on eyes closed today to increase use of proprioceptive and vestibular feedback.  Also returned to ankle strengthening at counter top with tandem gait, tandem stand, heel walking and toe walking.  Pt tolerated well but does continue to require close supervision-min A for balance when eyes were closed and when BOS more narrow.  Will continue to progress towards LTG.    Rehab  Potential  Good    Clinical Impairments Affecting Rehab Potential  Pt has short term memory deficits and cognitive changes from baseline which may negatively influence his ability to recall exercises and progressions in therapy.     PT Frequency  2x / week    PT Duration  8 weeks    PT Treatment/Interventions  ADLs/Self Care Home Management;Therapeutic activities;Dry needling;Therapeutic exercise;Balance training;Neuromuscular re-education;Manual techniques;Visual/perceptual remediation/compensation;Patient/family education;Stair training;Gait training;Electrical Stimulation;Functional mobility training;Passive range of motion;DME Instruction    PT Next Visit Plan  Continue to work on balance with eyes closed.  activities with dual tasking.  Floor ladder.  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  high level balance and strengthening in quadruped/ tall kneeling.    PT Home Exercise Plan  [email or web address]     Consulted and Agree with Plan of Care  Patient;Family member/caregiver    Family Member Consulted  wife- kim        Patient will benefit from skilled therapeutic intervention in order to improve the following deficits and impairments:  Abnormal gait, Decreased coordination, Decreased safety awareness, Impaired vision/preception, Decreased balance, Decreased cognition, Decreased mobility, Decreased strength  Visit Diagnosis: Muscle weakness (generalized)  Other abnormalities of gait and mobility  Unsteadiness on feet     Problem List Patient Active Problem List   Diagnosis Date Noted  . Visual disturbance   . Slow transit constipation   . Hypoalbuminemia due to protein-calorie malnutrition (HCC)   . Acute blood loss anemia   . S/P VP shunt   . Hydrocephalus (HCC) 08/30/2018  . Dyslipidemia   .  History of CVA (cerebrovascular accident)   . Benign essential HTN   . Tachycardia   . Leukocytosis   . Hyponatremia   . Hypokalemia    . 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 Bacterial meningitis 08/09/2018  . TIA (transient ischemic attack) 01/29/2017  . Acute encephalopathy   . Shunt malfunction 03/13/2016  . 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 Hyperlipidemia 10/20/2014  . Degenerative disc disease, lumbar 04/15/2013  . Routine general medical examination at a health care facility 07/23/2012  . DISTURBANCE OF SKIN SENSATION 10/05/2008  . HYPERLIPIDEMIA 01/01/2008  . MYCOPLASMA PNEUMONIA 01/01/2008    Dierdre Highman, PT, DPT 11/04/18    10:26 PM    [facility] [address] [place], [place], [address] Phone: [phone]   Fax:  [phone]  Name: Charles Marquez MRN: [id number] Date of Birth: 03-May-1956

## 2018-11-04 NOTE — Patient Instructions (Signed)
Access Code: [email or web address]  URL: [email or web address]  Date: 11/04/2018  Prepared by: Bufford LopeAudra Parks Czajkowski   Exercises  Side Stepping with Resistance at Thighs and Ankles - 4 reps - 1 sets - 1x daily - 3x weekly  Marching with Resistance - 4 reps - 1x daily - 3x weekly  Backward Walking with Counter Support - 4 reps - 2 sets - 1x daily - 2x weekly  Romberg Stance with Head Nods - 10 reps - 1x daily - 7x weekly  Romberg Stance with Head Rotation - 1 sets - 10 reps - 1x daily - 7x weekly  Romberg Stance Eyes Closed on Foam Pad - 3 sets - 30 hold - 1x daily - 7x weekly  Tandem Stance with Head Rotation - 2 sets - 10 reps - 1x daily - 7x weekly  Standing Single Leg Stance with Counter Support - 3 sets - 10 SECONDS hold - 1x daily - 3x weekly  Walking Tandem Stance - 10 reps - 4 sets - 1x daily - 3x weekly  Toe Walking with Counter Support - 10 reps - 4 sets - 1x daily - 7x weekly  Heel Walking with Counter Support - 10 reps - 4 sets - 1x daily - 7x weekly  Half-Kneeling to Standing - 3 reps - 1x daily - 7x weekly

## 2018-11-04 NOTE — Therapy (Signed)
[facility] [address] [place], [place], [address] Phone: [phone]   Fax:  [phone]  Speech Language Pathology Treatment  Patient Details  Name: Charles Marquez MRN: [id number] Date of Birth: June 14, 1956 Referring Provider (SLP): Dr. Posey Pronto   Encounter Date: 11/04/2018  End of Session - 11/04/18 1603    Visit Number  12    Number of Visits  17    Date for SLP Re-Evaluation  11/18/18    Authorization Type  BCBS 90 visits for all disciplines     SLP Start Time  1446    SLP Stop Time   1530    SLP Time Calculation (min)  44 min    Activity Tolerance  Patient tolerated treatment well       Past Medical History:  Diagnosis Date  . Anxiety   . Hypercholesteremia   . Stroke ([place])    tia's  . TIA (transient ischemic attack)    09.15    Past Surgical History:  Procedure Laterality Date  . Fractured arm Left 12  . HERNIA REPAIR Right 3/12  . LAPAROSCOPIC REVISION VENTRICULAR-PERITONEAL (V-P) SHUNT N/A 08/26/2018   Procedure: LAPAROSCOPIC INSERTION VENTRICULAR-PERITONEAL (V-P) SHUNT;  Surgeon: Earnie Larsson, MD;  Location: [facility];  Service: Neurosurgery;  Laterality: N/A;  . LOOP RECORDER INSERTION N/A 04/10/2017   Procedure: Loop Recorder Insertion;  Surgeon: Thompson Grayer, MD;  Location: [place] CV LAB;  Service: Cardiovascular;  Laterality: N/A;  . SHUNT REMOVAL Right 03/13/2016   Procedure: SHUNT REMOVAL;  Surgeon: Earnie Larsson, MD;  [facility]: [facility] NEURO ORS;  Service: Neurosurgery;  Laterality: Right;  . SHUNT REMOVAL Right 08/09/2018   Procedure: SHUNT REMOVAL With Placement of Ventricular Catheter;  Surgeon: Consuella Lose, MD;  Location: [facility];  Service: Neurosurgery;  Laterality: Right;  . SHUNT REVISION Right 08/05/2018   Procedure: SHUNT REVISION;  Surgeon: Earnie Larsson, MD;  Location: [facility];  Service: Neurosurgery;  Laterality: Right;  . SHUNT REVISION VENTRICULAR-PERITONEAL Left 08/26/2018    Procedure: SHUNT REVISION VENTRICULAR-PERITONEAL;  Surgeon: Earnie Larsson, MD;  Location: [facility];  Service: Neurosurgery;  Laterality: Left;  . SHUNT REVISION VENTRICULAR-PERITONEAL Left 09/02/2018   Procedure: Left Occipital VP shunt revision;  Surgeon: Earnie Larsson, MD;  Location: [place];  Service: Neurosurgery;  Laterality: Left;  [name] VASECTOMY  10/02/1997  . VENTRICULOPERITONEAL SHUNT Right 12/18/2014   Procedure: Shunt Placment - right occipital VP shunt ;  Surgeon: Charlie Pitter, MD;  Location: [place] NEURO ORS;  Service: Neurosurgery;  Laterality: Right;  Shunt Placment - right occipital VP shunt   . VENTRICULOPERITONEAL SHUNT Right 07/22/2018   Procedure: Shunt Placment right occipital;  Surgeon: Earnie Larsson, MD;  Location: [place];  Service: Neurosurgery;  Laterality: Right;  . VENTRICULOPERITONEAL SHUNT Left 08/26/2018   Procedure: LEFT SIDED VENTRICULAR-PERITONEAL SHUNT;  Surgeon: Earnie Larsson, MD;  Location: [facility];  Service: Neurosurgery;  Laterality: Left;  [name] VENTRICULOSTOMY Right 08/09/2018   Procedure: VENTRICULOSTOMY;  Surgeon: Consuella Lose, MD;  Location: [facility];  Service: Neurosurgery;  Laterality: Right;    There were no vitals filed for this visit.  Subjective Assessment - 11/04/18 1541    Subjective  Wife reports pt struggled with writing task at home    Patient is accompained by:  Family member   wife   Currently in Pain?  No/denies            ADULT SLP TREATMENT - 11/04/18 1445      General Information  Behavior/Cognition  Alert;Cooperative    Patient Positioning  Upright in chair      Treatment Provided   Treatment provided  Cognitive-Linquistic      Pain Assessment   Pain Assessment  No/denies pain      Cognitive-Linquistic Treatment   Treatment focused on  Aphasia;Cognition    Skilled Treatment  SLP targeted verbal expression and recall in functional task (discussing pt's holiday traditions). Wife reports communication breakdowns when talking about this topic  last night. SLP demo'd using written keywords to help cue pt for topic. Pt generated 5 items from his family's traditional Christmas Eve dinner. [name] memory deficits also interfered in his ability to discuss family traditions. SLP explained/demo'd different approaches to cuing pt when he had difficulties with memory vs wordfinding. Pt showed SLP recipe he worked on at home; wife reports it was "a struggle." SLP praised pt for his effort, which was comprehensible despite having several spelling, paraphasic, and syntax errors. SLP reinforced that the goal was for pt to practice his writing, not to produce a perfectly correct recipe. Pt very tearful at the end of the session; SLP provided encouragement, reinforcing that he is a competent communicator, even if he makes mistakes. Discussed neuropsych with wife; she is currently attempting to schedule with Dr. Jefm Miles. SLP informed pt/wife of local community group for aphasia; will provide additional community/support resources next session.       Assessment / Recommendations / Plan   Plan  Continue with current plan of care      Progression Toward Goals   Progression toward goals  Progressing toward goals         SLP Short Term Goals - 11/04/18 1545      SLP SHORT TERM GOAL #1   Title  Pt will complete standardized assessment of cognition.    Status  Achieved      SLP SHORT TERM GOAL #2   Title  Pt will ID object/picture to simple description/feature/function f:4 with occasional min A over 3 sessions     Status  Partially Met      SLP SHORT TERM GOAL #3   Title  Pt will demo auditory comprehension of 5 minutes simple-mod complex conversation by responding appropriately or asking questions over 2 sessions    Status  Achieved      SLP SHORT TERM GOAL #4   Title  Pt will name basic objects/pictures 7/10 correct with occasional mod A over 3 sessions     Status  Partially Met      SLP SHORT TERM GOAL #5   Title  Pt will demo sustained  attention for 10 minutes in simple cognitive-linguistic task x3 sessions.    Status  Achieved      SLP SHORT TERM GOAL #6   Title  Pt will have a memory system to assist in managing appointments, schedules, medical and therapy information and bring with him to 3 therapy sessions     Status  Achieved       SLP Long Term Goals - 11/04/18 1546      SLP LONG TERM GOAL #1   Title  Pt will write or name 5 items in a category with occasional min A over 4 sessions     Baseline  10/31/18, 11/04/18    Time  3    Period  --   or 17 visits, for all LTGs   Status  On-going      SLP LONG TERM GOAL #2   Title  Pt will demo error awareness by attempting correction or by nonverbal response to errors 75% of the time over 3 sessions    Baseline  10/17/18, 10/31/18    Time  3    Period  Weeks    Status  On-going      SLP LONG TERM GOAL #3   Title  Pt will participate functionally in 10 minutes simple-mod complex conversation with conversational supports for aphasia over 3 sessions.    Baseline  10/17/18, 10/31/18    Time  3    Period  Weeks    Status  On-going      SLP LONG TERM GOAL #4   Title  Pt will demo selective attention in min noisy environment for 10 minutes in a simple-mod complex cognitive linguistic task over three sessions    Time  3    Period  Weeks    Status  On-going      SLP LONG TERM GOAL #5   Title  Pt will utilize memory compensation system to recall details/manage appointments, schedules, medical and therapy information with rare min A over 4 sessions      Time  3    Period  Weeks    Status  On-going       Plan - 11/04/18 1603    Clinical Impression Statement  Mr. Mortimer continues to present with aphasia, attention and memory impairments. While simple conversation has improved, pt continues to have paraphasias, wordfinding and auditory comprehension difficulties in mod complex and complex conversation. Pt continues to exhibit anxiety and some frustration with errors. Pt  tearful today; wife is pursuing neuropsych consult. Continue skilled ST to maximize communication and cognition for wants/needs, safety, independence and QOL.    Speech Therapy Frequency  2x / week    Duration  --   8 weeks or 17 visits   Treatment/Interventions  Cognitive reorganization;Multimodal communcation approach;Environmental controls;Compensatory strategies;Language facilitation;Compensatory techniques;Cueing hierarchy;Internal/external aids;Functional tasks;SLP instruction and feedback;Patient/family education    Potential to Achieve Goals  Good    Potential Considerations  Severity of impairments       Patient will benefit from skilled therapeutic intervention in order to improve the following deficits and impairments:   Aphasia  Cognitive communication deficit    Problem List Patient Active Problem List   Diagnosis Date Noted  . Visual disturbance   . Slow transit constipation   . Hypoalbuminemia due to protein-calorie malnutrition ([place])   . Acute blood loss anemia   . S/P VP shunt   . Hydrocephalus ([place]) 08/30/2018  . Dyslipidemia   . History of CVA (cerebrovascular accident)   . Benign essential HTN   . Tachycardia   . Leukocytosis   . Hyponatremia   . Hypokalemia   . 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 Infection of ventricular shunt ([place]) 08/09/2018  . Bacterial meningitis 08/09/2018  . TIA (transient ischemic attack) 01/29/2017  . Acute encephalopathy   . Shunt malfunction 03/13/2016  . 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 Communicating hydrocephalus ([place]) 12/18/2014  . Hyperlipidemia 10/20/2014  . Degenerative disc disease, lumbar 04/15/2013  . Routine general medical examination at a health care facility 07/23/2012  . DISTURBANCE OF SKIN SENSATION 10/05/2008  . HYPERLIPIDEMIA 01/01/2008  . MYCOPLASMA PNEUMONIA 01/01/2008   Deneise Lever, [place], [place] Speech-Language Pathologist   Aliene Altes 11/04/2018, 4:06 PM  [facility] [address] [place], [place], [address] Phone: [phone]   Fax:  [phone]   Name:  Charles Marquez MRN: [id number] Date of Birth: 10-Jan-1956

## 2018-11-05 ENCOUNTER — Emergency Department (HOSPITAL_COMMUNITY)
Admission: EM | Admit: 2018-11-05 | Discharge: 2018-11-05 | Disposition: A | Discharge status: Home / Self Care | Payer: BLUE CROSS/BLUE SHIELD | Attending: Emergency Medicine | Admitting: Emergency Medicine

## 2018-11-05 ENCOUNTER — Encounter (HOSPITAL_COMMUNITY): Payer: Self-pay | Admitting: Emergency Medicine

## 2018-11-05 ENCOUNTER — Ambulatory Visit: Payer: BLUE CROSS/BLUE SHIELD

## 2018-11-05 ENCOUNTER — Emergency Department (HOSPITAL_COMMUNITY): Payer: BLUE CROSS/BLUE SHIELD

## 2018-11-05 ENCOUNTER — Other Ambulatory Visit: Payer: Self-pay

## 2018-11-05 DIAGNOSIS — T8509XA Other mechanical complication of ventricular intracranial (communicating) shunt, initial encounter: Secondary | ICD-10-CM | POA: Diagnosis not present

## 2018-11-05 DIAGNOSIS — Y752 Prosthetic and other implants, materials and neurological devices associated with adverse incidents: Secondary | ICD-10-CM | POA: Insufficient documentation

## 2018-11-05 DIAGNOSIS — R42 Dizziness and giddiness: Secondary | ICD-10-CM | POA: Diagnosis present

## 2018-11-05 LAB — CUP PACEART REMOTE DEVICE CHECK
Date Time Interrogation Session: 20191221023724
Implantable Pulse Generator Implant Date: 20180529

## 2018-11-05 LAB — COMPREHENSIVE METABOLIC PANEL
ALBUMIN: 3.3 g/dL — AB (ref 3.5–5.0)
ALT: 21 U/L (ref 0–44)
AST: 20 U/L (ref 15–41)
Alkaline Phosphatase: 51 U/L (ref 38–126)
Anion gap: 10 (ref 5–15)
BUN: 11 mg/dL (ref 6–20)
CHLORIDE: 103 mmol/L (ref 98–111)
CO2: 25 mmol/L (ref 22–32)
Calcium: 8.8 mg/dL — ABNORMAL LOW (ref 8.9–10.3)
Creatinine, Ser: 0.9 mg/dL (ref 0.61–1.24)
GFR calc Af Amer: >60 mL/min (ref >60)
GFR calc non Af Amer: >60 mL/min (ref >60)
Glucose, Bld: 102 mg/dL — ABNORMAL HIGH (ref 70–99)
Potassium: 3.9 mmol/L (ref 3.5–5.1)
Sodium: 138 mmol/L (ref 135–145)
Total Bilirubin: 0.7 mg/dL (ref 0.3–1.2)
Total Protein: 6.8 g/dL (ref 6.5–8.1)

## 2018-11-05 LAB — DIFFERENTIAL
ABS IMMATURE GRANULOCYTES: 0.02 10*3/uL (ref 0.00–0.07)
Basophils Absolute: 0 10*3/uL (ref 0.0–0.1)
Basophils Relative: 0 %
Eosinophils Absolute: 0.1 10*3/uL (ref 0.0–0.5)
Eosinophils Relative: 1 %
IMMATURE GRANULOCYTES: 0 %
Lymphocytes Relative: 19 %
Lymphs Abs: 1.4 10*3/uL (ref 0.7–4.0)
Monocytes Absolute: 1.1 10*3/uL — ABNORMAL HIGH (ref 0.1–1.0)
Monocytes Relative: 15 %
Neutro Abs: 4.7 10*3/uL (ref 1.7–7.7)
Neutrophils Relative %: 65 %

## 2018-11-05 LAB — URINALYSIS, ROUTINE W REFLEX MICROSCOPIC
Bilirubin Urine: NEGATIVE
Glucose, UA: NEGATIVE mg/dL
HGB URINE DIPSTICK: NEGATIVE
Ketones, ur: NEGATIVE mg/dL
Leukocytes, UA: NEGATIVE
Nitrite: NEGATIVE
Protein, ur: NEGATIVE mg/dL
Specific Gravity, Urine: 1.013 (ref 1.005–1.030)
pH: 7 (ref 5.0–8.0)

## 2018-11-05 LAB — I-STAT TROPONIN, ED: Troponin i, poc: 0 ng/mL (ref 0.00–0.08)

## 2018-11-05 LAB — I-STAT CHEM 8, ED
BUN: 13 mg/dL (ref 6–20)
Calcium, Ion: 1.11 mmol/L — ABNORMAL LOW (ref 1.15–1.40)
Chloride: 103 mmol/L (ref 98–111)
Creatinine, Ser: 0.9 mg/dL (ref 0.61–1.24)
Glucose, Bld: 99 mg/dL (ref 70–99)
HCT: 37 % — ABNORMAL LOW (ref 39.0–52.0)
Hemoglobin: 12.6 g/dL — ABNORMAL LOW (ref 13.0–17.0)
Potassium: 3.9 mmol/L (ref 3.5–5.1)
Sodium: 139 mmol/L (ref 135–145)
TCO2: 30 mmol/L (ref 22–32)

## 2018-11-05 LAB — RAPID URINE DRUG SCREEN, HOSP PERFORMED
Amphetamines: NOT DETECTED
Barbiturates: NOT DETECTED
Benzodiazepines: NOT DETECTED
Cocaine: NOT DETECTED
Opiates: NOT DETECTED
TETRAHYDROCANNABINOL: NOT DETECTED

## 2018-11-05 LAB — CBC
HCT: 38.6 % — ABNORMAL LOW (ref 39.0–52.0)
HEMOGLOBIN: 12 g/dL — AB (ref 13.0–17.0)
MCH: 29.1 pg (ref 26.0–34.0)
MCHC: 31.1 g/dL (ref 30.0–36.0)
MCV: 93.5 fL (ref 80.0–100.0)
Platelets: 192 10*3/uL (ref 150–400)
RBC: 4.13 MIL/uL — AB (ref 4.22–5.81)
RDW: 13.7 % (ref 11.5–15.5)
WBC: 7.3 10*3/uL (ref 4.0–10.5)
nRBC: 0 % (ref 0.0–0.2)

## 2018-11-05 LAB — PROTIME-INR
INR: 1.11
Prothrombin Time: 14.2 seconds (ref 11.4–15.2)

## 2018-11-05 LAB — APTT: aPTT: 32 seconds (ref 24–36)

## 2018-11-05 NOTE — Discharge Instructions (Addendum)
Get help right away if: You have: A fever or other signs of infection. New or worsening confusion. New or worsening sleepiness. A hard time staying awake. A headache that is getting worse. You start to twitch or shake (seizure). You lose coordination or balance. Your or your family members become concerned for your safety.

## 2018-11-05 NOTE — Progress Notes (Signed)
Patient well-known to me.  Presented today with increasing unsteadiness.  No headache.  Wife states that his confusion is slightly worse.  No fevers.  No focal neurologic signs.  Patient awake and alert.  He is oriented and reasonably appropriate.  He is mildly ataxic.  His motor strength is intact bilaterally.  His shunt pumps and refills very easily.  (Previously when he had a proximal shunt malfunction his shunt did not pump or refill)   Follow-up head CT scan demonstrates enlargement of his lateral ventricles.  I recently reprogrammed his shunt to a higher setting because the patient was having some difficulty with low pressure headaches.  I am worried that I have overcorrected his shunt drainage.  I reprogrammed his shunt to level 0.5.  I discussed situation with the patient and his wife.  They would both like to go home and see how he does with reprogramming his shunt alone.  They recognize the signs and symptoms of worsening hydrocephalus and will call me and report back to the emergency room should these occur.

## 2018-11-05 NOTE — ED Triage Notes (Signed)
Pt arrives to ED from home with complaints of new symptoms of being off balance and dizzy since last night. Pt reports "fuzzy" vision. Pt reports he has a VP shunt for hydrocephalous. Pt placed in position of comfort with bed locked and lowered, call bell in reach.

## 2018-11-05 NOTE — ED Notes (Signed)
D/c reviewed with patient and spouse 

## 2018-11-05 NOTE — ED Notes (Signed)
ED Provider at bedside. 

## 2018-11-05 NOTE — ED Notes (Signed)
Patient transported to CT 

## 2018-11-05 NOTE — ED Notes (Signed)
Neurosurgeon at bedside °

## 2018-11-05 NOTE — ED Provider Notes (Signed)
[facility] EMERGENCY DEPARTMENT Provider Note   CSN: [id number] Arrival date & time: 11/05/18  0920     History   Chief Complaint Chief Complaint  Patient presents with  . Dizziness    HPI Charles Marquez is a 62 y.o. male who presents the emergency department for dizziness and imbalance.  There is a level 5 caveat due to memory issues and history is predominantly given by the patient's wife who is at bedside.  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  The patient has issues with his memory, mild right sided hemiparesis, mild double vision and mild nystagmus along with gait abnormalities and ataxia.  He is chronically anticoagulated with clopidogrel.  His wife states that last night she noticed he was more unsteady on his feet than normal.  She states that he was having some mild falls but did not hit his head.  He normally does not use assistive devices to ambulate.  She says that occasionally at night his balance is worse.  This morning when he awoke he was noticeably ataxic and frankly unable to walk without significant assistance.  She also noticed that his nystagmus was severe.  The patient does state that he has some room spinning and feels very off balance.  He denies any other neurologic sxs.  He has no Fever or chills.     Past Medical History:  Diagnosis Date  . Anxiety   . Hypercholesteremia   . Stroke (HCC)    tia's  . TIA (transient ischemic attack)    09.15    Patient Active Problem List   Diagnosis Date Noted  . Visual disturbance   . Slow transit constipation   . Hypoalbuminemia due to protein-calorie malnutrition (HCC)   . Acute blood loss anemia   . S/P VP shunt   . Hydrocephalus (HCC) 08/30/2018  . Dyslipidemia   . History of CVA (cerebrovascular accident)   . Benign essential HTN     . Tachycardia   . Leukocytosis   . Hyponatremia   . Hypokalemia   . 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 Bacterial meningitis 08/09/2018  . TIA (transient ischemic attack) 01/29/2017  . Acute encephalopathy   . Shunt malfunction 03/13/2016  . 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 Hyperlipidemia 10/20/2014  . Degenerative disc disease, lumbar 04/15/2013  . Routine general medical examination at a health care facility 07/23/2012  . DISTURBANCE OF SKIN SENSATION 10/05/2008  . HYPERLIPIDEMIA 01/01/2008  . MYCOPLASMA PNEUMONIA 01/01/2008    Past Surgical History:  Procedure Laterality Date  . Fractured arm Left 12  . HERNIA REPAIR Right 3/12  . LAPAROSCOPIC REVISION VENTRICULAR-PERITONEAL (V-P) SHUNT N/A 08/26/2018   Procedure: LAPAROSCOPIC INSERTION VENTRICULAR-PERITONEAL (V-P) SHUNT;  Surgeon: Julio Sicks, MD;  Location: [facility] OR;  Service: Neurosurgery;  Laterality: N/A;  . LOOP RECORDER INSERTION N/A 04/10/2017   Procedure: Loop Recorder Insertion;  Surgeon: Hillis Range, MD;  Location: [facility] INVASIVE CV LAB;  Service: Cardiovascular;  Laterality: N/A;  . SHUNT REMOVAL Right 03/13/2016   Procedure: SHUNT REMOVAL;  Surgeon: Julio Sicks, MD;  Location: [facility] NEURO ORS;  Service: Neurosurgery;  Laterality: Right;  . SHUNT REMOVAL Right 08/09/2018   Procedure: SHUNT REMOVAL With Placement of Ventricular Catheter;  Surgeon: Lisbeth Renshaw, MD;  Location: [facility] OR;  Service:  Neurosurgery;  Laterality: Right;  . SHUNT REVISION Right 08/05/2018   Procedure: SHUNT REVISION;  Surgeon: Julio Sicks, MD;  Location: [facility] OR;  Service: Neurosurgery;  Laterality: Right;  . SHUNT REVISION VENTRICULAR-PERITONEAL Left 08/26/2018   Procedure: SHUNT REVISION VENTRICULAR-PERITONEAL;  Surgeon: Julio Sicks, MD;  Location: [facility] OR;  Service: Neurosurgery;  Laterality: Left;  . SHUNT REVISION  VENTRICULAR-PERITONEAL Left 09/02/2018   Procedure: Left Occipital VP shunt revision;  Surgeon: Julio Sicks, MD;  Location: [facility] OR;  Service: Neurosurgery;  Laterality: Left;  [name] VASECTOMY  10/02/1997  . 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 VENTRICULOPERITONEAL SHUNT Right 07/22/2018   Procedure: Shunt Placment right occipital;  Surgeon: Julio Sicks, MD;  Location: [facility] OR;  Service: Neurosurgery;  Laterality: Right;  . VENTRICULOPERITONEAL SHUNT Left 08/26/2018   Procedure: LEFT SIDED VENTRICULAR-PERITONEAL SHUNT;  Surgeon: Julio Sicks, MD;  Location: [facility] OR;  Service: Neurosurgery;  Laterality: Left;  [name] VENTRICULOSTOMY Right 08/09/2018   Procedure: VENTRICULOSTOMY;  Surgeon: Lisbeth Renshaw, MD;  Location: [facility] OR;  Service: Neurosurgery;  Laterality: Right;        Home Medications    Prior to Admission medications   Medication Sig Start Date End Date Taking? Authorizing Provider  acetaminophen (TYLENOL) 325 MG tablet Take 2 tablets (650 mg total) by mouth every 4 (four) hours as needed for mild pain (temp > 100.5). 09/18/18   Angiulli, Mcarthur Rossetti, PA-C  Artificial Tear Ointment (DRY EYES OP) Apply 1 drop to eye daily as needed (for drye eyes).    [provider]  atorvastatin (LIPITOR) 40 MG tablet Take 1 tablet (40 mg total) by mouth daily. 09/18/18   Angiulli, Mcarthur Rossetti, PA-C  famotidine (PEPCID) 20 MG tablet Take 1 tablet (20 mg total) by mouth 2 (two) times daily. 09/18/18   Angiulli, Mcarthur Rossetti, PA-C  hydroxypropyl methylcellulose / hypromellose (ISOPTO TEARS / GONIOVISC) 2.5 % ophthalmic solution Place 1 drop into both eyes as needed for dry eyes.    [provider]  Multiple Vitamins-Minerals (OCUVITE EYE HEALTH FORMULA) CAPS Take 1 capsule by mouth daily.    [provider]  Pseudoephedrine-guaiFENesin [facility] D PO) Take by mouth.    [provider]  sodium chloride (OCEAN) 0.65 % SOLN nasal spray Place 1 spray into both nostrils as needed for congestion.    [provider]  traMADol (ULTRAM) 50 MG tablet Take 1 tablet (50 mg total) by mouth at bedtime as needed (pain). 09/18/18   Angiulli, Mcarthur Rossetti, PA-C  triamcinolone (NASACORT ALLERGY 24HR) 55 MCG/ACT AERO nasal inhaler Place 2 sprays into the nose daily.    [provider]    Family History Family History  Problem Relation Age of Onset  . COPD Mother   . Lung cancer Father   . Alzheimer's disease Father     Social History Social History   Tobacco Use  . Smoking status: Never Smoker  . Smokeless tobacco: Never Used  Substance Use Topics  . Alcohol use: Yes    Alcohol/week: 0.0 standard drinks    Comment: ocassionally  . Drug use: No     Allergies   Patient has no known allergies.   Review of Systems Review of Systems  Unable to perform ROS: Other  Memory issues/ unreliable   Physical Exam  Updated Vital Signs Ht 5\' 11"  (1.803 m)   Wt 83.9 kg   BMI 25.80 kg/m   Physical Exam Vitals signs and nursing note reviewed. Exam conducted with a chaperone present.  Constitutional:      General: He is not in acute distress.    Appearance: Normal appearance. He is well-developed. He is not diaphoretic.  HENT:     Head: Normocephalic and atraumatic.     Right Ear: Tympanic membrane normal.     Left Ear: Tympanic membrane normal.     Nose: Nose normal.  Eyes:     General: No scleral icterus.    Extraocular Movements: Extraocular movements intact.     Conjunctiva/sclera: Conjunctivae normal.     Pupils: Pupils are equal, round, and reactive to light.  Neck:     Musculoskeletal: Normal range of motion and neck supple.  Cardiovascular:     Rate and Rhythm: Normal rate and regular rhythm.     Heart sounds: Normal heart sounds.  Pulmonary:      Effort: Pulmonary effort is normal. No respiratory distress.     Breath sounds: Normal breath sounds.  Abdominal:     Palpations: Abdomen is soft.     Tenderness: There is no abdominal tenderness.  Skin:    General: Skin is warm and dry.  Neurological:     Mental Status: He is alert. Mental status is at baseline.     Cranial Nerves: Cranial nerve deficit present.     Coordination: Coordination abnormal.     Comments: Speech is clear and goal oriented, follows commands Major Cranial nerves without deficit- EXCEPT FOR MARKED NYSTAGMUS WHICH IS ROTARY-  NEGATIVE TEST OF SKEW no facial droop Normal strength in upper and lower extremities bilaterally including dorsiflexion and plantar flexion, strong and equal grip strength Sensation normal to light and sharp touch Moves extremities without ataxia, coordination intact Abnormal finger to nose and rapid alternating movements Neg romberg, no pronator drift    Psychiatric:        Behavior: Behavior normal.      ED Treatments / Results  Labs (all labs ordered are listed, but only abnormal results are displayed) Labs Reviewed  ETHANOL  PROTIME-INR  APTT  CBC  DIFFERENTIAL  COMPREHENSIVE METABOLIC PANEL  RAPID URINE DRUG SCREEN, HOSP PERFORMED  URINALYSIS, ROUTINE W REFLEX MICROSCOPIC  I-STAT CHEM 8, ED  I-STAT TROPONIN, ED    EKG None  Radiology No results found.  Procedures Procedures (including critical care time)  Medications Ordered in ED Medications - No data to display   Initial Impression / Assessment and Plan / ED Course  I have reviewed the triage vital signs and the nursing notes.  Pertinent labs & imaging results that were available during my care of the patient were reviewed by me and considered in my medical decision making (see chart for details).     Patient with VP shunt malfunction.  He is hemodynamically stable throughout his ED visit.  I spoke with Dr. Dutch QuintPoole who will reprogram the patient's  shunt and discharge him with close follow up .  I have reviewed the patient's imaging including his shunt series and CT.  I agree with the radiologic interpretation of the patient's CT head showing shunt malfunction and acute hydrocephalus.  I reviewed the patient's CMP which shows mild hypocalcemia and hypoalbuminemia. Patient appears stable for discharge.  Final Clinical Impressions(s) / ED Diagnoses   Final diagnoses:  Obstructed VP shunt, initial encounter [facility](HCC)    ED  Discharge Orders    None       Arthor CaptainHarris, Shakeerah Gradel, PA-C 11/05/18 1643    Lorre NickAllen, Anthony, MD 11/06/18 [phone]

## 2018-11-05 NOTE — Progress Notes (Signed)
Carelink Summary Report / Loop Recorder 

## 2018-11-05 NOTE — ED Provider Notes (Signed)
Medical screening examination/treatment/procedure(s) were conducted as a shared visit with non-physician practitioner(s) and myself.  I personally evaluated the patient during the encounter.  EKG Interpretation  Date/Time:  Tuesday November 05 2018 09:38:37 EST Ventricular Rate:  67 PR Interval:    QRS Duration: 95 QT Interval:  405 QTC Calculation: 428 R Axis:   11 Text Interpretation:  Sinus rhythm Atrial premature complexes Baseline wander in lead(s) V3 Confirmed by Lorre NickAllen, Melvyn Hommes ([id number]) on 11/05/2018 10:[id number] AM 62 year old male with history of VP shunt presents with dizziness.  Patient has shunt series and the results were discussed with his neurosurgeon who will come and see the patient   Lorre NickAllen, Geremy Rister, MD 11/05/18 [phone]

## 2018-11-05 NOTE — ED Notes (Signed)
Patient came to Emergency Department conscious alert and oriented x4 with VP shunt problems, was ambulatory to wheelchair at ED entrance. Patient was wheeled into the room and asked to stand and turn to sit in the bed. Patient stood from the wheelchair with no difficulty and took steps to bed with steady gait. Upon sitting on the bed, the mattress was noted to have shifted to off the side of the bed with no frame underneath. When the patient sat down, the mattress gave way and the patient fell to the ground first contacting his back side to the floor, unsure if secondary head contact. Patient stated that he was okay and was able to stand back up and sit in the bed with no difficulties. ER techs, nurse, and PA entered the room immediately after occurrence of the event and wife followed soon after. I explained occurrence to same. No apparent distress was noted throughout entirety of event, vital signs stable.

## 2018-11-07 ENCOUNTER — Telehealth: Payer: Self-pay | Admitting: Internal Medicine

## 2018-11-07 NOTE — Telephone Encounter (Signed)
Patient's wife is called stating they her husband had a loop device place a couple a years ago.  He would like to have it removed.

## 2018-11-08 ENCOUNTER — Telehealth: Payer: Self-pay

## 2018-11-08 NOTE — Telephone Encounter (Signed)
Spoke with pts wife who was very upset about getting charged for Oct and Nov monthly summary reports when pt was in the hospital. When asked pts wife about the specific dates that the monthly summary reports were charged she stated that pt was home. Pts wife was upset because she was under the impression that pt was being monitored nightly informed her that this was typically the case. Informed that the loop recorder in pts chest stores all the data. Pts wife stated that when the device was put in this was not explained to her, pts wife is adamant that pt not to be charged anymore and wants apt with Dr.Allred pts wife wanted to know how much money Dr. Allred/office gets for putting in these monitors informed her that I did not have the answer to that. Informed pts wife that the pt needed an apt with Dr. Johney FrameAllred to discuss discontinuing the monitor. Pts wife requested that home monitor apt be cancelled for Jan 22. Informed pts wife that I would call her back with an apt to discuss discontinuing home monitoring. Pts wife voiced understanding.

## 2018-11-08 NOTE — Telephone Encounter (Signed)
LVM informing pts wife on apt on 12/16/2018 w/ JA at 3:15pm

## 2018-11-08 NOTE — Telephone Encounter (Signed)
Left message requesting call back. ? ?Also sent MyChart message. ?

## 2018-11-11 ENCOUNTER — Ambulatory Visit: Payer: BLUE CROSS/BLUE SHIELD | Admitting: Speech Pathology

## 2018-11-11 ENCOUNTER — Telehealth: Payer: Self-pay

## 2018-11-11 DIAGNOSIS — R41842 Visuospatial deficit: Secondary | ICD-10-CM | POA: Diagnosis not present

## 2018-11-11 DIAGNOSIS — R4701 Aphasia: Secondary | ICD-10-CM

## 2018-11-11 DIAGNOSIS — R41841 Cognitive communication deficit: Secondary | ICD-10-CM

## 2018-11-11 NOTE — Telephone Encounter (Signed)
Pt wife called to reschedule 12/16/2018 appointment to 12/25/2018.

## 2018-11-11 NOTE — Therapy (Signed)
[facility] [address] [place], [place], [address] Phone: [phone]   Fax:  [phone]  Speech Language Pathology Treatment  Patient Details  Name: Charles Marquez MRN: [id number] Date of Birth: 26-Aug-1956 Referring Provider (SLP): Dr. Posey Pronto   Encounter Date: 11/11/2018  End of Session - 11/11/18 1426    Visit Number  13    Number of Visits  17    Date for SLP Re-Evaluation  11/18/18    Authorization Type  [employer] 90 visits for all disciplines     SLP Start Time  1102    SLP Stop Time   1147    SLP Time Calculation (min)  45 min    Activity Tolerance  Patient tolerated treatment well       Past Medical History:  Diagnosis Date  . Anxiety   . Hypercholesteremia   . Stroke ([place])    tia's  . TIA (transient ischemic attack)    09.15    Past Surgical History:  Procedure Laterality Date  . Fractured arm Left 12  . HERNIA REPAIR Right 3/12  . LAPAROSCOPIC REVISION VENTRICULAR-PERITONEAL (V-P) SHUNT N/A 08/26/2018   Procedure: LAPAROSCOPIC INSERTION VENTRICULAR-PERITONEAL (V-P) SHUNT;  Surgeon: Earnie Larsson, MD;  Location: [place];  Service: Neurosurgery;  Laterality: N/A;  . LOOP RECORDER INSERTION N/A 04/10/2017   Procedure: Loop Recorder Insertion;  Surgeon: Thompson Grayer, MD;  Location: [place] CV LAB;  Service: Cardiovascular;  Laterality: N/A;  . SHUNT REMOVAL Right 03/13/2016   Procedure: SHUNT REMOVAL;  Surgeon: Earnie Larsson, MD;  Location: [facility] NEURO ORS;  Service: Neurosurgery;  Laterality: Right;  . SHUNT REMOVAL Right 08/09/2018   Procedure: SHUNT REMOVAL With Placement of Ventricular Catheter;  Surgeon: Consuella Lose, MD;  Location: [place];  Service: Neurosurgery;  Laterality: Right;  . SHUNT REVISION Right 08/05/2018   Procedure: SHUNT REVISION;  Surgeon: Earnie Larsson, MD;  Location: [place];  Service: Neurosurgery;  Laterality: Right;  . SHUNT REVISION VENTRICULAR-PERITONEAL Left 08/26/2018    Procedure: SHUNT REVISION VENTRICULAR-PERITONEAL;  Surgeon: Earnie Larsson, MD;  Location: [place];  Service: Neurosurgery;  Laterality: Left;  . SHUNT REVISION VENTRICULAR-PERITONEAL Left 09/02/2018   Procedure: Left Occipital VP shunt revision;  Surgeon: Earnie Larsson, MD;  Location: [place];  Service: Neurosurgery;  Laterality: Left;  [name] VASECTOMY  10/02/1997  . VENTRICULOPERITONEAL SHUNT Right 12/18/2014   Procedure: Shunt Placment - right occipital VP shunt ;  Surgeon: Charlie Pitter, MD;  Location: [place] NEURO ORS;  Service: Neurosurgery;  Laterality: Right;  Shunt Placment - right occipital VP shunt   . VENTRICULOPERITONEAL SHUNT Right 07/22/2018   Procedure: Shunt Placment right occipital;  Surgeon: Earnie Larsson, MD;  Location: [place];  Service: Neurosurgery;  Laterality: Right;  . VENTRICULOPERITONEAL SHUNT Left 08/26/2018   Procedure: LEFT SIDED VENTRICULAR-PERITONEAL SHUNT;  Surgeon: Earnie Larsson, MD;  Location: [place];  Service: Neurosurgery;  Laterality: Left;  [name] VENTRICULOSTOMY Right 08/09/2018   Procedure: VENTRICULOSTOMY;  Surgeon: Consuella Lose, MD;  Location: [place];  Service: Neurosurgery;  Laterality: Right;    There were no vitals filed for this visit.  Subjective Assessment - 11/11/18 1401    Subjective  Pt with ED visit on 11/05/18; shunt reprogrammed    Patient is accompained by:  Family member   wife   Currently in Pain?  No/denies            ADULT SLP TREATMENT - 11/11/18 1102      General Information  Behavior/Cognition  Alert;Cooperative      Treatment Provided   Treatment provided  Cognitive-Linquistic      Pain Assessment   Pain Assessment  No/denies pain      Cognitive-Linquistic Treatment   Treatment focused on  Aphasia;Cognition    Skilled Treatment  Pt had increasing confusion, unsteadiness last week and had trip to ED on 11/05/18, had shunt reprogrammed. Wife stated she feels pt's difficulties with writing, conversation and lability last session attributable  in part to fluid buildup. Pt gave SLP two pieces of writing he worked on over the holidays: another recipe, and a letter to SLP describing his family gathering. Wife reports these tasks were smoother for pt, and he did not struggle as much as with last assignment. SLP noted paraphasic/syntax errors however pt's message/instructions were comprehensible. Pt to continue writing tasks at home. SLP provided education re: [facility] [place] [facility] and encouraged pt to participate in conversation group with his wife. Targeted written and verbal expression in functional task by having pt list his goals for the upcoming year. Occasional min-mod A for generating synonyms/descriptions when encountering wordfinding difficulties. Occasional cues for initiation vs fixating on generating a "perfect" sentence. 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 SLP had pt list items in each category (eg. contacts/network in "positives" and physical impairments as a barrier). Pt to continue working on this list as well as personal goals for 2020 at home.      Assessment / Recommendations / Plan   Plan  Continue with current plan of care      Progression Toward Goals   Progression toward goals  Progressing toward goals         SLP Short Term Goals - 11/11/18 1428      SLP SHORT TERM GOAL #1   Title  Pt will complete standardized assessment of cognition.    Status  Achieved      SLP SHORT TERM GOAL #2   Title  Pt will ID object/picture to simple description/feature/function f:4 with occasional min A over 3 sessions     Status  Partially Met      SLP SHORT TERM GOAL #3   Title  Pt will demo auditory comprehension of 5 minutes simple-mod complex conversation by responding appropriately or asking questions over 2 sessions    Status  Achieved      SLP SHORT TERM GOAL #4   Title  Pt will name basic objects/pictures 7/10 correct with  occasional mod A over 3 sessions     Status  Partially Met      SLP SHORT TERM GOAL #5   Title  Pt will demo sustained attention for 10 minutes in simple cognitive-linguistic task x3 sessions.    Status  Achieved      SLP SHORT TERM GOAL #6   Title  Pt will have a memory system to assist in managing appointments, schedules, medical and therapy information and bring with him to 3 therapy sessions     Status  Achieved       SLP Long Term Goals - 11/11/18 1428      SLP LONG TERM GOAL #1   Title  Pt will write or name 5 items in a category with occasional min A over 4 sessions     Baseline  10/31/18, 11/04/18    Time  2    Period  [place]  Status  On-going      SLP LONG TERM GOAL #2   Title  Pt will demo error awareness by attempting correction or by nonverbal response to errors 75% of the time over 3 sessions    Baseline  10/17/18, 10/31/18    Time  2    Period  Weeks    Status  On-going      SLP LONG TERM GOAL #3   Title  Pt will participate functionally in 10 minutes simple-mod complex conversation with conversational supports for aphasia over 3 sessions.    Baseline  10/17/18, 10/31/18    Time  2    Period  Weeks    Status  On-going      SLP LONG TERM GOAL #4   Title  Pt will demo selective attention in min noisy environment for 10 minutes in a simple-mod complex cognitive linguistic task over three sessions    Time  2    Period  Weeks    Status  On-going      SLP LONG TERM GOAL #5   Title  Pt will utilize memory compensation system to recall details/manage appointments, schedules, medical and therapy information with rare min A over 4 sessions      Time  2    Period  Weeks    Status  On-going       Plan - 11/11/18 1426    Clinical Impression Statement  Charles Marquez continues to present with aphasia, attention and memory impairments. While simple conversation has improved, pt continues to have paraphasias, wordfinding and auditory comprehension difficulties in mod  complex and complex conversation. Pt continues to exhibit anxiety and some frustration with errors. Continue skilled ST to maximize communication and cognition for wants/needs, safety, independence and QOL.     Speech Therapy Frequency  2x / week    Duration  --   8 weeks or 17 visits   Treatment/Interventions  Cognitive reorganization;Multimodal communcation approach;Environmental controls;Compensatory strategies;Language facilitation;Compensatory techniques;Cueing hierarchy;Internal/external aids;Functional tasks;SLP instruction and feedback;Patient/family education    Potential to Achieve Goals  Good    Potential Considerations  Severity of impairments       Patient will benefit from skilled therapeutic intervention in order to improve the following deficits and impairments:   Aphasia  Cognitive communication deficit    Problem List Patient Active Problem List   Diagnosis Date Noted  . Visual disturbance   . Slow transit constipation   . Hypoalbuminemia due to protein-calorie malnutrition ([place])   . Acute blood loss anemia   . S/P VP shunt   . Hydrocephalus ([place]) 08/30/2018  . Dyslipidemia   . History of CVA (cerebrovascular accident)   . Benign essential HTN   . Tachycardia   . Leukocytosis   . Hyponatremia   . Hypokalemia   . 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 Infection of ventricular shunt ([place]) 08/09/2018  . Bacterial meningitis 08/09/2018  . TIA (transient ischemic attack) 01/29/2017  . Acute encephalopathy   . Shunt malfunction 03/13/2016  . 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 Communicating hydrocephalus ([place]) 12/18/2014  . Hyperlipidemia 10/20/2014  . Degenerative disc disease, lumbar 04/15/2013  . Routine general medical examination at a health care facility 07/23/2012  . DISTURBANCE OF SKIN SENSATION 10/05/2008  . HYPERLIPIDEMIA 01/01/2008  . MYCOPLASMA PNEUMONIA 01/01/2008   Deneise Lever, [place], [place] 11/11/2018, 2:29 PM  [facility] [address] [place], [place], [address] Phone:  [phone]   Fax:  [phone]   Name: Charles Marquez MRN: [id number] Date of Birth: 1956/03/09

## 2018-11-12 ENCOUNTER — Ambulatory Visit: Payer: BLUE CROSS/BLUE SHIELD | Admitting: Physical Therapy

## 2018-11-12 ENCOUNTER — Encounter: Payer: Self-pay | Admitting: Physical Therapy

## 2018-11-12 ENCOUNTER — Ambulatory Visit: Payer: BLUE CROSS/BLUE SHIELD | Admitting: Speech Pathology

## 2018-11-12 DIAGNOSIS — R4701 Aphasia: Secondary | ICD-10-CM

## 2018-11-12 DIAGNOSIS — R41841 Cognitive communication deficit: Secondary | ICD-10-CM

## 2018-11-12 DIAGNOSIS — R41842 Visuospatial deficit: Secondary | ICD-10-CM | POA: Diagnosis not present

## 2018-11-12 DIAGNOSIS — R2689 Other abnormalities of gait and mobility: Secondary | ICD-10-CM

## 2018-11-12 DIAGNOSIS — R2681 Unsteadiness on feet: Secondary | ICD-10-CM

## 2018-11-12 DIAGNOSIS — M6281 Muscle weakness (generalized): Secondary | ICD-10-CM

## 2018-11-12 NOTE — Therapy (Signed)
[facility] [address] [place], [place], [address] Phone: [phone]   Fax:  [phone]  Speech Language Pathology Treatment  Patient Details  Name: Charles Marquez MRN: [id number] Date of Birth: 04-08-1956 Referring Provider (SLP): Dr. Posey Pronto   Encounter Date: 11/12/2018  End of Session - 11/12/18 1204    Visit Number  14    Number of Visits  17    Date for SLP Re-Evaluation  11/18/18    Authorization Type  BCBS 90 visits for all disciplines     SLP Start Time  52    SLP Stop Time   1146    SLP Time Calculation (min)  44 min    Activity Tolerance  Patient tolerated treatment well       Past Medical History:  Diagnosis Date  . Anxiety   . Hypercholesteremia   . Stroke ([place])    tia's  . TIA (transient ischemic attack)    09.15    Past Surgical History:  Procedure Laterality Date  . Fractured arm Left 12  . HERNIA REPAIR Right 3/12  . LAPAROSCOPIC REVISION VENTRICULAR-PERITONEAL (V-P) SHUNT N/A 08/26/2018   Procedure: LAPAROSCOPIC INSERTION VENTRICULAR-PERITONEAL (V-P) SHUNT;  Surgeon: Earnie Larsson, MD;  Location: [facility];  Service: Neurosurgery;  Laterality: N/A;  . LOOP RECORDER INSERTION N/A 04/10/2017   Procedure: Loop Recorder Insertion;  Surgeon: Thompson Grayer, MD;  Location: [facility];  Service: Cardiovascular;  Laterality: N/A;  . SHUNT REMOVAL Right 03/13/2016   Procedure: SHUNT REMOVAL;  Surgeon: Earnie Larsson, MD;  [facility];  Service: Neurosurgery;  Laterality: Right;  . SHUNT REMOVAL Right 08/09/2018   Procedure: SHUNT REMOVAL With Placement of Ventricular Catheter;  Surgeon: Consuella Lose, MD;  Location: [facility];  Service: Neurosurgery;  Laterality: Right;  . SHUNT REVISION Right 08/05/2018   Procedure: SHUNT REVISION;  Surgeon: Earnie Larsson, MD;  Location: [facility];  Service: Neurosurgery;  Laterality: Right;  . SHUNT REVISION VENTRICULAR-PERITONEAL Left 08/26/2018    Procedure: SHUNT REVISION VENTRICULAR-PERITONEAL;  Surgeon: Earnie Larsson, MD;  Location: [facility];  Service: Neurosurgery;  Laterality: Left;  . SHUNT REVISION VENTRICULAR-PERITONEAL Left 09/02/2018   Procedure: Left Occipital VP shunt revision;  Surgeon: Earnie Larsson, MD;  Location: [facility];  Service: Neurosurgery;  Laterality: Left;  [name] VASECTOMY  10/02/1997  . VENTRICULOPERITONEAL SHUNT Right 12/18/2014   Procedure: Shunt Placment - right occipital VP shunt ;  Surgeon: Charlie Pitter, MD;  Location: [facility];  Service: Neurosurgery;  Laterality: Right;  Shunt Placment - right occipital VP shunt   . VENTRICULOPERITONEAL SHUNT Right 07/22/2018   Procedure: Shunt Placment right occipital;  Surgeon: Earnie Larsson, MD;  Location: [facility];  Service: Neurosurgery;  Laterality: Right;  . VENTRICULOPERITONEAL SHUNT Left 08/26/2018   Procedure: LEFT SIDED VENTRICULAR-PERITONEAL SHUNT;  Surgeon: Earnie Larsson, MD;  Location: [facility];  Service: Neurosurgery;  Laterality: Left;  [name] VENTRICULOSTOMY Right 08/09/2018   Procedure: VENTRICULOSTOMY;  Surgeon: Consuella Lose, MD;  Location: [facility];  Service: Neurosurgery;  Laterality: Right;    There were no vitals filed for this visit.  Subjective Assessment - 11/12/18 1155    Subjective  Pt arrives with list of goals for the new year    Patient is accompained by:  Family member   wife   Currently in Pain?  No/denies            ADULT SLP TREATMENT - 11/12/18 1102      General Information  Behavior/Cognition  Alert;Cooperative;Pleasant mood      Treatment Provided   Treatment provided  Cognitive-Linquistic      Pain Assessment   Pain Assessment  No/denies pain      Cognitive-Linquistic Treatment   Treatment focused on  Aphasia;Cognition    Skilled Treatment  Pt showed SLP list of goals he generated at home, with 1-2 in each category (career, family, finance, spiritual, social, etc). SLP worked with pt to identify activities he can do at home to work toward  his personal goals (see pt instructions). 10 minutes simple-mod complex conversation re: his goals was functional, with pt attempting correction or non-verbal response to errors <80% of the time. SLP demo'd text-to-speech to aid reading text messages and emails. Pt expressed desire to read the news; SLP educated re: [employer] News application. Targeted reading comprehension/sustained attention with simple news articles; pt read aloud, correcting paraphasias ~90% of the time. Comprehension questions 6/6.       Assessment / Recommendations / Plan   Plan  Continue with current plan of care      Progression Toward Goals   Progression toward goals  Progressing toward goals         SLP Short Term Goals - 11/12/18 1159      SLP SHORT TERM GOAL #1   Title  Pt will complete standardized assessment of cognition.    Status  Achieved      SLP SHORT TERM GOAL #2   Title  Pt will ID object/picture to simple description/feature/function f:4 with occasional min A over 3 sessions     Status  Partially Met      SLP SHORT TERM GOAL #3   Title  Pt will demo auditory comprehension of 5 minutes simple-mod complex conversation by responding appropriately or asking questions over 2 sessions    Status  Achieved      SLP SHORT TERM GOAL #4   Title  Pt will name basic objects/pictures 7/10 correct with occasional mod A over 3 sessions     Status  Partially Met      SLP SHORT TERM GOAL #5   Title  Pt will demo sustained attention for 10 minutes in simple cognitive-linguistic task x3 sessions.    Status  Achieved      SLP SHORT TERM GOAL #6   Title  Pt will have a memory system to assist in managing appointments, schedules, medical and therapy information and bring with him to 3 therapy sessions     Status  Achieved       SLP Long Term Goals - 11/12/18 1159      SLP LONG TERM GOAL #1   Title  Pt will write or name 5 items in a category with occasional min A over 4 sessions     Baseline  10/31/18,  11/04/18    Time  2    Period  Weeks    Status  On-going      SLP LONG TERM GOAL #2   Title  Pt will demo error awareness by attempting correction or by nonverbal response to errors 75% of the time over 3 sessions    Baseline  10/17/18, 10/31/18, 11/11/18    Time  2    Period  Weeks    Status  Achieved      SLP LONG TERM GOAL #3   Title  Pt will participate functionally in 10 minutes simple-mod complex conversation with conversational supports for aphasia over 3 sessions.    Baseline  10/17/18, 10/31/18, 11/11/18    Time  2    Period  Weeks    Status  On-going      SLP LONG TERM GOAL #4   Title  Pt will demo selective attention in min noisy environment for 10 minutes in a simple-mod complex cognitive linguistic task over three sessions    Time  2    Period  Weeks    Status  On-going      SLP LONG TERM GOAL #5   Title  Pt will utilize memory compensation system to recall details/manage appointments, schedules, medical and therapy information with rare min A over 4 sessions      Time  2    Period  Weeks    Status  On-going       Plan - 11/12/18 1204    Clinical Impression Statement  Charles Marquez continues to present with aphasia, attention and memory impairments. Error awareness continues to improve, functional today in simple-mod complex conversation. Pt continues to have paraphasias, wordfinding and auditory comprehension difficulties in mod complex and complex conversation. Positive reinforcement/encouragement effective in reducing anxiety and improving initiation in verbal and written tasks today. Continue skilled ST to maximize communication and cognition for wants/needs, safety, independence and QOL.     Speech Therapy Frequency  2x / week    Duration  --   8 weeks or 17 visits   Treatment/Interventions  Cognitive reorganization;Multimodal communcation approach;Environmental controls;Compensatory strategies;Language facilitation;Compensatory techniques;Cueing  hierarchy;Internal/external aids;Functional tasks;SLP instruction and feedback;Patient/family education    Potential to Achieve Goals  Good    Potential Considerations  Severity of impairments       Patient will benefit from skilled therapeutic intervention in order to improve the following deficits and impairments:   Aphasia  Cognitive communication deficit    Problem List Patient Active Problem List   Diagnosis Date Noted  . Visual disturbance   . Slow transit constipation   . Hypoalbuminemia due to protein-calorie malnutrition ([place])   . Acute blood loss anemia   . S/P VP shunt   . Hydrocephalus ([place]) 08/30/2018  . Dyslipidemia   . History of CVA (cerebrovascular accident)   . Benign essential HTN   . Tachycardia   . Leukocytosis   . Hyponatremia   . Hypokalemia   . 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 Bacterial meningitis 08/09/2018  . TIA (transient ischemic attack) 01/29/2017  . Acute encephalopathy   . Shunt malfunction 03/13/2016  . 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 Hyperlipidemia 10/20/2014  . Degenerative disc disease, lumbar 04/15/2013  . Routine general medical examination at a health care facility 07/23/2012  . DISTURBANCE OF SKIN SENSATION 10/05/2008  . HYPERLIPIDEMIA 01/01/2008  . MYCOPLASMA PNEUMONIA 01/01/2008   Deneise Lever, [place], [place] Speech-Language Pathologist   Aliene Altes 11/12/2018, 12:06 PM  [facility] [address] [place], [place], [address] Phone: [phone]   Fax:  [phone]   Name: Charles Marquez MRN: [id number] Date of Birth: October 19, 1956

## 2018-11-12 NOTE — Therapy (Signed)
[facility] [address] [place], [place], [address] Phone: [phone]   Fax:  [phone]  Physical Therapy Treatment  Patient Details  Name: Charles Marquez MRN: [id number] Date of Birth: April 26, 1956 Referring Provider (PT): Dr. Allena Katz    Encounter Date: 11/12/2018  PT End of Session - 11/12/18 1246    Visit Number  15    Number of Visits  17    Date for PT Re-Evaluation  11/18/18    Authorization Type  BCBS OTHER     Authorization Time Period  PT/OT/ST/Hydro therapy covered x 90 days    PT Start Time  1145    PT Stop Time  1235    PT Time Calculation (min)  50 min    Activity Tolerance  Patient tolerated treatment well    Behavior During Therapy  [facility] for tasks assessed/performed       Past Medical History:  Diagnosis Date  . Anxiety   . Hypercholesteremia   . Stroke ([facility])    tia's  . TIA (transient ischemic attack)    09.15    Past Surgical History:  Procedure Laterality Date  . Fractured arm Left 12  . HERNIA REPAIR Right 3/12  . LAPAROSCOPIC REVISION VENTRICULAR-PERITONEAL (V-P) SHUNT N/A 08/26/2018   Procedure: LAPAROSCOPIC INSERTION VENTRICULAR-PERITONEAL (V-P) SHUNT;  Surgeon: Julio Sicks, MD;  [facility];  Service: Neurosurgery;  Laterality: N/A;  . LOOP RECORDER INSERTION N/A 04/10/2017   Procedure: Loop Recorder Insertion;  Surgeon: Hillis Range, MD;  [facility];  Service: Cardiovascular;  Laterality: N/A;  . SHUNT REMOVAL Right 03/13/2016   Procedure: SHUNT REMOVAL;  Surgeon: Julio Sicks, MD;  [facility];  Service: Neurosurgery;  Laterality: Right;  . SHUNT REMOVAL Right 08/09/2018   Procedure: SHUNT REMOVAL With Placement of Ventricular Catheter;  Surgeon: Lisbeth Renshaw, MD;  [facility];  Service: Neurosurgery;  Laterality: Right;  . SHUNT REVISION Right 08/05/2018   Procedure: SHUNT REVISION;  Surgeon: Julio Sicks, MD;  Location: [facility];  Service:  Neurosurgery;  Laterality: Right;  . SHUNT REVISION VENTRICULAR-PERITONEAL Left 08/26/2018   Procedure: SHUNT REVISION VENTRICULAR-PERITONEAL;  Surgeon: Julio Sicks, MD;  [facility];  Service: Neurosurgery;  Laterality: Left;  . SHUNT REVISION VENTRICULAR-PERITONEAL Left 09/02/2018   Procedure: Left Occipital VP shunt revision;  Surgeon: Julio Sicks, MD;  [facility];  Service: Neurosurgery;  Laterality: Left;  [name] VASECTOMY  10/02/1997  . 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 VENTRICULOPERITONEAL SHUNT Right 07/22/2018   Procedure: Shunt Placment right occipital;  Surgeon: Julio Sicks, MD;  Location: [facility] OR;  Service: Neurosurgery;  Laterality: Right;  . VENTRICULOPERITONEAL SHUNT Left 08/26/2018   Procedure: LEFT SIDED VENTRICULAR-PERITONEAL SHUNT;  Surgeon: Julio Sicks, MD;  [facility];  Service: Neurosurgery;  Laterality: Left;  [name] VENTRICULOSTOMY Right 08/09/2018   Procedure: VENTRICULOSTOMY;  Surgeon: Lisbeth Renshaw, MD;  Location: [facility] OR;  Service: Neurosurgery;  Laterality: Right;    There were no vitals filed for this visit.  Subjective Assessment - 11/12/18 1150    Subjective  After last visit pt had to go to ER due to change in mental status and balance; had to have shunt adjusted.  Pt seems to be back to baseline and more alert and  engaged today.      Patient is accompained by:  Family member   Kim: wife   Pertinent History   S/p VP shunt, Hydrocephalus, CVA with loop recorder insertion 04/10/2017 as well as R occipital VP shunt placement 2016, Infection of ventricular shunt, shunt malfunction, HLD, anxiety, TIA, Hypercholesteremia, acute blood loss anemia, HTN, Tachycardia, Leukocytosis, Hyponatremia, Bacterial Encephalitis, Bacterial Meningitis, Dyslipidemia, DDD lumbar  spine, and Mycoplasma Pneumonia.     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  No/denies                            Balance Exercises - 11/12/18 1210      Balance Exercises: Standing   SLS with Vectors  Intermittent upper extremity assist;Foam/compliant surface   rockerboard, cone taps in various sequences   Rockerboard  Anterior/posterior;Lateral;Head turns;EO   no UE support, head nods/turns with cog naming task   Step Ups  Forward   to rockerboard with cone taps in various directions   Other Standing Exercises  Dual task during forwards and retro gait, side stepping to L and R down hallway while performing ball toss and bounce during naming task          PT Short Term Goals - 10/22/18 1127      PT SHORT TERM GOAL #1   Title  Pt will participate in establishment of initial HEP to improve strength, balance, and functional mobility.     Time  4    Period  Weeks    Status  Achieved      PT SHORT TERM GOAL #2   Title  Pt will ambulate 500 feet outdoors navigating uneven terrain, curbs, ramps, and perform dual cognitive tasks with therapist providing supervision to improve safety with community distances.     Time  4    Period  Weeks    Status  Achieved      PT SHORT TERM GOAL #3   Title  Pt will perform 12 steps with reciprocal stepping technique and single HR demonstrating improvement in safety with community accessibility with therapist providing supervision.     Time  4    Period  Weeks    Status  Achieved      PT SHORT TERM GOAL #4   Title  Pt will report no LOB episodes when walking over his gravel drive way indicating improvement in safety with functional mobility at home.    Time  4    Period  Weeks    Status  Achieved      PT SHORT TERM GOAL #5   Title  Pt will improve FGA score to >/= 20/30 indicating improvement in functional mobility.      Baseline  22/30 on 10/18/18    Time  4    Period  Weeks    Status  Achieved        PT Long Term Goals - 09/19/18 1530      PT LONG TERM GOAL #1   Title  Pt will be independent and compliant with performing is HEP to improve balance, strength, and functional mobility.    Time  8    Period  Weeks    Status  New    Target Date  11/18/18      PT LONG TERM GOAL #2   Title  Pt will ambulate 1000 feet outdoors at mod I level navigating uneven terrain, curbs, ramps, and perform dual cognitive tasks to increase safety with community distances.     Time  8    Period  Weeks    Status  New    Target Date  11/18/18      PT LONG TERM GOAL #3   Title  Pt will demonstrate 12 steps with no HR's at mod I level with good safety awareness to improve community accessibility.     Time  8    Period  Weeks    Status  New    Target Date  11/18/18      PT LONG TERM GOAL #4   Title  Pt will improve FGA score to >/=25/30 indicating improvement in functional mobility.     Time  8    Period  Weeks    Status  New    Target Date  11/18/18            Plan - 11/12/18 1246    Clinical Impression Statement  Continued to combine cognitive/naming/sequencing tasks with gait in various directions, static and dynamic balance on unstable surfaces to continue to address attention and safety during dual tasking.  Pt able to alternate attention for short duration and then required cues from therapist; pt did intermittently have to cease one task in order to improve focus on other task - required cues to re-start.  Pt tolerated well requiring only one sitting rest break.    Rehab Potential  Good    Clinical Impairments Affecting Rehab Potential  Pt has short term memory deficits and cognitive changes from baseline which may negatively influence his ability to recall exercises and progressions in therapy.     PT Frequency  2x / week    PT Duration  8 weeks    PT Treatment/Interventions  ADLs/Self Care Home  Management;Therapeutic activities;Dry needling;Therapeutic exercise;Balance training;Neuromuscular re-education;Manual techniques;Visual/perceptual remediation/compensation;Patient/family education;Stair training;Gait training;Electrical Stimulation;Functional mobility training;Passive range of motion;DME Instruction    PT Next Visit Plan  CHECK LTG AND RECERTIFY.  Continue to work on balance with eyes closed.  activities with dual tasking.  Floor ladder.  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  high level balance and strengthening in quadruped/ tall kneeling.    PT Home Exercise Plan  [id number]     Consulted and Agree with Plan of Care  Patient;Family member/caregiver    Family Member Consulted  wife- kim        Patient will benefit from skilled therapeutic intervention in order to improve the following deficits and impairments:  Abnormal gait, Decreased coordination, Decreased safety awareness, Impaired vision/preception, Decreased balance, Decreased cognition, Decreased mobility, Decreased strength  Visit Diagnosis: Muscle weakness (generalized)  Other abnormalities of gait and mobility  Unsteadiness on feet     Problem List Patient Active Problem List   Diagnosis Date Noted  . Visual disturbance   . Slow transit constipation   . Hypoalbuminemia due to protein-calorie malnutrition ([facility])   . Acute blood loss anemia   . S/P VP shunt   . Hydrocephalus ([facility]) 08/30/2018  . Dyslipidemia   . History of CVA (cerebrovascular accident)   . Benign essential HTN   . Tachycardia   . Leukocytosis   . Hyponatremia   . Hypokalemia   . 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 Bacterial meningitis 08/09/2018  . TIA (transient ischemic attack) 01/29/2017  . Acute  encephalopathy   . Shunt malfunction 03/13/2016  . 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 Communicating  hydrocephalus ([facility]) 12/18/2014  . Hyperlipidemia 10/20/2014  . Degenerative disc disease, lumbar 04/15/2013  . Routine general medical examination at a health care facility 07/23/2012  . DISTURBANCE OF SKIN SENSATION 10/05/2008  . HYPERLIPIDEMIA 01/01/2008  . MYCOPLASMA PNEUMONIA 01/01/2008    Dierdre Highman, PT, DPT 11/12/18    12:53 PM    [facility] [address] [place], [place], [address] Phone: [phone]   Fax:  [phone]  Name: Charles Marquez MRN: [id number] Date of Birth: 1956-06-16

## 2018-11-12 NOTE — Patient Instructions (Signed)
To work on some of your goals, try:  Family: Texting your grandkids, use text-to-speech (speak selection) on your phone to help you read, practice reading simple books to them, take your family to the aphasia group  Finance: Investigate some budgeting apps ([employer] is an example... if you search for budget in the app store, you'll find lots)  Personal: Reading the news- look at [employer] news  Spiritual: gratitude tree, journaling (suggestion: write down things you pray about or that you are thankful for)  Social: attend your network breakfast, try small gatherings with people you trust. You can bring your friends to speech therapy, if you like!

## 2018-11-14 ENCOUNTER — Other Ambulatory Visit: Payer: Self-pay | Admitting: Neurosurgery

## 2018-11-14 ENCOUNTER — Ambulatory Visit: Payer: BLUE CROSS/BLUE SHIELD | Attending: Physical Medicine & Rehabilitation | Admitting: Physical Therapy

## 2018-11-14 DIAGNOSIS — R2681 Unsteadiness on feet: Secondary | ICD-10-CM | POA: Diagnosis present

## 2018-11-14 DIAGNOSIS — G910 Communicating hydrocephalus: Secondary | ICD-10-CM

## 2018-11-14 DIAGNOSIS — M6281 Muscle weakness (generalized): Secondary | ICD-10-CM | POA: Insufficient documentation

## 2018-11-14 DIAGNOSIS — R2689 Other abnormalities of gait and mobility: Secondary | ICD-10-CM | POA: Diagnosis not present

## 2018-11-14 DIAGNOSIS — R41841 Cognitive communication deficit: Secondary | ICD-10-CM | POA: Diagnosis present

## 2018-11-14 DIAGNOSIS — R4701 Aphasia: Secondary | ICD-10-CM | POA: Diagnosis present

## 2018-11-14 NOTE — Therapy (Signed)
[facility] [address] [place], [place], [address] Phone: [phone]   Fax:  [phone]  Physical Therapy Treatment  Patient Details  Name: Charles Marquez MRN: [id number] Date of Birth: [birth year] Referring Provider (PT): Dr. Posey Pronto    Encounter Date: 11/14/2018  PT End of Session - 11/14/18 1842    Visit Number  16    Number of Visits  17    Date for PT Re-Evaluation  11/18/18    Authorization Type  BCBS OTHER     Authorization Time Period  PT/OT/ST/Hydro therapy covered x 90 days    PT Start Time  1102    PT Stop Time  1150    PT Time Calculation (min)  48 min    Equipment Utilized During Treatment  Gait belt       Past Medical History:  Diagnosis Date  . Anxiety   . Hypercholesteremia   . Stroke ([place])    tia's  . TIA (transient ischemic attack)    09.15    Past Surgical History:  Procedure Laterality Date  . Fractured arm Left 12  . HERNIA REPAIR Right 3/12  . LAPAROSCOPIC REVISION VENTRICULAR-PERITONEAL (V-P) SHUNT N/A 08/26/2018   Procedure: LAPAROSCOPIC INSERTION VENTRICULAR-PERITONEAL (V-P) SHUNT;  Surgeon: Earnie Larsson, MD;  Location: [facility];  Service: Neurosurgery;  Laterality: N/A;  . LOOP RECORDER INSERTION N/A 04/10/2017   Procedure: Loop Recorder Insertion;  Surgeon: Thompson Grayer, MD;  Location: [facility];  Service: Cardiovascular;  Laterality: N/A;  . SHUNT REMOVAL Right 03/13/2016   Procedure: SHUNT REMOVAL;  Surgeon: Earnie Larsson, MD;  Location: [facility];  Service: Neurosurgery;  Laterality: Right;  . SHUNT REMOVAL Right 08/09/2018   Procedure: SHUNT REMOVAL With Placement of Ventricular Catheter;  Surgeon: Consuella Lose, MD;  Location: [facility];  Service: Neurosurgery;  Laterality: Right;  . SHUNT REVISION Right 08/05/2018   Procedure: SHUNT REVISION;  Surgeon: Earnie Larsson, MD;  Location: [facility];  Service: Neurosurgery;  Laterality: Right;  . SHUNT REVISION  VENTRICULAR-PERITONEAL Left 08/26/2018   Procedure: SHUNT REVISION VENTRICULAR-PERITONEAL;  Surgeon: Earnie Larsson, MD;  Location: [facility];  Service: Neurosurgery;  Laterality: Left;  . SHUNT REVISION VENTRICULAR-PERITONEAL Left 09/02/2018   Procedure: Left Occipital VP shunt revision;  Surgeon: Earnie Larsson, MD;  Location: [facility];  Service: Neurosurgery;  Laterality: Left;  [name] VASECTOMY  10/02/1997  . VENTRICULOPERITONEAL SHUNT Right 12/18/2014   Procedure: Shunt Placment - right occipital VP shunt ;  Surgeon: Charlie Pitter, MD;  Location: [facility];  Service: Neurosurgery;  Laterality: Right;  Shunt Placment - right occipital VP shunt   . VENTRICULOPERITONEAL SHUNT Right 07/22/2018   Procedure: Shunt Placment right occipital;  Surgeon: Earnie Larsson, MD;  Location: [facility];  Service: Neurosurgery;  Laterality: Right;  . VENTRICULOPERITONEAL SHUNT Left 08/26/2018   Procedure: LEFT SIDED VENTRICULAR-PERITONEAL SHUNT;  Surgeon: Earnie Larsson, MD;  Location: [facility];  Service: Neurosurgery;  Laterality: Left;  [name] VENTRICULOSTOMY Right 08/09/2018   Procedure: VENTRICULOSTOMY;  Surgeon: Consuella Lose, MD;  Location: [facility];  Service: Neurosurgery;  Laterality: Right;    There were no vitals filed for this visit.  Subjective Assessment - 11/14/18 1830    Subjective  No changes since previous PT session on Tuesday this week; pt accompanied to PT by his daughter    Patient is accompained by:  Family member   daughter   Pertinent History   S/p VP shunt, Hydrocephalus, CVA with loop recorder insertion  04/10/2017 as well as R occipital VP shunt placement 2016, Infection of ventricular shunt, shunt malfunction, HLD, anxiety, TIA, Hypercholesteremia, acute blood loss anemia, HTN, Tachycardia, Leukocytosis, Hyponatremia, Bacterial Encephalitis, Bacterial Meningitis, Dyslipidemia, DDD lumbar spine, and Mycoplasma Pneumonia.     Patient Stated Goals  "Id love to be able to run backwards again to [employer] basketball games  and improve my memory."    Currently in Pain?  No/denies         [facility] PT Assessment - 11/14/18 0001      Functional Gait  Assessment   Gait Level Surface  Walks 20 ft in less than 5.5 sec, no assistive devices, good speed, no evidence for imbalance, normal gait pattern, deviates no more than 6 in outside of the 12 in walkway width.    Change in Gait Speed  Able to smoothly change walking speed without loss of balance or gait deviation. Deviate no more than 6 in outside of the 12 in walkway width.    Gait with Horizontal Head Turns  Performs head turns smoothly with slight change in gait velocity (eg, minor disruption to smooth gait path), deviates 6-10 in outside 12 in walkway width, or uses an assistive device.    Gait with Vertical Head Turns  Performs task with slight change in gait velocity (eg, minor disruption to smooth gait path), deviates 6 - 10 in outside 12 in walkway width or uses assistive device    Gait and Pivot Turn  Pivot turns safely within 3 sec and stops quickly with no loss of balance.    Step Over Obstacle  Is able to step over 2 stacked shoe boxes taped together (9 in total height) without changing gait speed. No evidence of imbalance.    Gait with Narrow Base of Support  Ambulates 4-7 steps.    Gait with Eyes Closed  Walks 20 ft, slow speed, abnormal gait pattern, evidence for imbalance, deviates 10-15 in outside 12 in walkway width. Requires more than 9 sec to ambulate 20 ft.    Ambulating Backwards  Walks 20 ft, uses assistive device, slower speed, mild gait deviations, deviates 6-10 in outside 12 in walkway width.    Steps  Alternating feet, no rail.    Total Score  23                   [facility] Adult PT Treatment/Exercise - 11/14/18 1119      Ambulation/Gait   Stairs  Yes    Stairs Assistance  5: Supervision    Stair Management Technique  No rails;Forwards    Number of Stairs  12   4 steps x 3 reps for 12 total steps   Height of Stairs  6      High  Level Balance   High Level Balance Activities  Braiding;Marching forwards;Tandem walking    High Level Balance Comments  Pt has more difficulty with tandem stance than with SLS          Balance Exercises - 11/14/18 1843      Balance Exercises: Standing   Standing Eyes Opened  Wide (BOA);Narrow base of support (BOS);Head turns;Foam/compliant surface;5 reps   standing on blue balance beam - perpendicular and in tandem   Standing Eyes Closed  Wide (BOA);Head turns;Foam/compliant surface;5 reps   standing on blue balance beam & on incline (ramp)   Tandem Stance  Eyes open;Foam/compliant surface;2 reps;15 secs    SLS with Vectors  Other (comment);Intermittent upper extremity assist;5 reps  standing on Bosu inside // bars   Rockerboard  Anterior/posterior;Head turns;EO;10 reps;Intermittent UE support    Tandem Gait  Forward;2 reps;Retro   inside // bars   Retro Gait  Head turns;2 reps   inside // bars = marching backwards         PT Short Term Goals - 10/22/18 1127      PT SHORT TERM GOAL #1   Title  Pt will participate in establishment of initial HEP to improve strength, balance, and functional mobility.     Time  4    Period  Weeks    Status  Achieved      PT SHORT TERM GOAL #2   Title  Pt will ambulate 500 feet outdoors navigating uneven terrain, curbs, ramps, and perform dual cognitive tasks with therapist providing supervision to improve safety with community distances.     Time  4    Period  Weeks    Status  Achieved      PT SHORT TERM GOAL #3   Title  Pt will perform 12 steps with reciprocal stepping technique and single HR demonstrating improvement in safety with community accessibility with therapist providing supervision.     Time  4    Period  Weeks    Status  Achieved      PT SHORT TERM GOAL #4   Title  Pt will report no LOB episodes when walking over his gravel drive way indicating improvement in safety with functional mobility at home.    Time  4     Period  Weeks    Status  Achieved      PT SHORT TERM GOAL #5   Title  Pt will improve FGA score to >/= 20/30 indicating improvement in functional mobility.     Baseline  22/30 on 10/18/18    Time  4    Period  Weeks    Status  Achieved        PT Long Term Goals - 11/14/18 1106      PT LONG TERM GOAL #1   Title  Pt will be independent and compliant with performing is HEP to improve balance, strength, and functional mobility.    Baseline  met 11-14-18    Status  Achieved      PT LONG TERM GOAL #2   Title  Pt will ambulate 1000 feet outdoors at mod I level navigating uneven terrain, curbs, ramps, and perform dual cognitive tasks to increase safety with community distances.     Baseline  met per daughter's report - 11-14-18    Status  Achieved      PT LONG TERM GOAL #3   Title  Pt will demonstrate 12 steps with no HR's at mod I level with good safety awareness to improve community accessibility.     Baseline  met 11-14-18    Status  Achieved      PT LONG TERM GOAL #4   Title  Pt will improve FGA score to >/=25/30 indicating improvement in functional mobility.     Baseline  score 23/30 - 11-14-18    Status  Not Met            Plan - 11/14/18 1849    Clinical Impression Statement  Pt has met LTG's #1-3; LTG #4 not met as FGA score has improved 1 point, from 22/30 to 23/30, not to goal of at least 25/30;  pt has most difficulty with balance activities with EC on compliant surfaces,  indicative of vestibular dysfunction     Rehab Potential  Good    Clinical Impairments Affecting Rehab Potential  Pt has short term memory deficits and cognitive changes from baseline which may negatively influence his ability to recall exercises and progressions in therapy.     PT Frequency  2x / week    PT Duration  8 weeks    PT Treatment/Interventions  ADLs/Self Care Home Management;Therapeutic activities;Dry needling;Therapeutic exercise;Balance training;Neuromuscular re-education;Manual  techniques;Visual/perceptual remediation/compensation;Patient/family education;Stair training;Gait training;Electrical Stimulation;Functional mobility training;Passive range of motion;DME Instruction    PT Next Visit Plan  NEEDS RECERT completed as LTG's were checked on 11-14-18:  Continue to work on balance with eyes closed.  activities with dual tasking.  Floor ladder.  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  high level balance and strengthening in quadruped/ tall kneeling.    PT Home Exercise Plan  [id number]     Consulted and Agree with Plan of Care  Patient;Family member/caregiver    Family Member Consulted  daughter       Patient will benefit from skilled therapeutic intervention in order to improve the following deficits and impairments:  Abnormal gait, Decreased coordination, Decreased safety awareness, Impaired vision/preception, Decreased balance, Decreased cognition, Decreased mobility, Decreased strength  Visit Diagnosis: Other abnormalities of gait and mobility  Unsteadiness on feet     Problem List Patient Active Problem List   Diagnosis Date Noted  . Visual disturbance   . Slow transit constipation   . Hypoalbuminemia due to protein-calorie malnutrition ([facility])   . Acute blood loss anemia   . S/P VP shunt   . Hydrocephalus ([facility]) 08/30/2018  . Dyslipidemia   . History of CVA (cerebrovascular accident)   . Benign essential HTN   . Tachycardia   . Leukocytosis   . Hyponatremia   . Hypokalemia   . 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 Infection of ventricular shunt ([facility]) 08/09/2018  . Bacterial meningitis 08/09/2018  . TIA (transient ischemic attack) 01/29/2017  . Acute encephalopathy   . Shunt malfunction 03/13/2016  . 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 Hyperlipidemia 10/20/2014  . Degenerative disc disease, lumbar 04/15/2013  . Routine  general medical examination at a health care facility 07/23/2012  . DISTURBANCE OF SKIN SENSATION 10/05/2008  . HYPERLIPIDEMIA 01/01/2008  . MYCOPLASMA PNEUMONIA 01/01/2008    Alda Lea, PT 11/14/2018, 6:57 PM  [facility] [address] [place], [place], [address] Phone: [phone]   Fax:  [phone]  Name: Waymon Laser MRN: [id number] Date of Birth: [birth year]

## 2018-11-15 ENCOUNTER — Encounter

## 2018-11-15 ENCOUNTER — Other Ambulatory Visit: Payer: Self-pay | Admitting: Neurosurgery

## 2018-11-17 LAB — CUP PACEART REMOTE DEVICE CHECK
Date Time Interrogation Session: 20191118013820
Implantable Pulse Generator Implant Date: 20180529

## 2018-11-18 ENCOUNTER — Ambulatory Visit: Payer: BLUE CROSS/BLUE SHIELD

## 2018-11-18 DIAGNOSIS — R2689 Other abnormalities of gait and mobility: Secondary | ICD-10-CM | POA: Diagnosis not present

## 2018-11-18 DIAGNOSIS — R41841 Cognitive communication deficit: Secondary | ICD-10-CM

## 2018-11-18 DIAGNOSIS — R4701 Aphasia: Secondary | ICD-10-CM

## 2018-11-19 ENCOUNTER — Ambulatory Visit
Admission: RE | Admit: 2018-11-19 | Discharge: 2018-11-19 | Disposition: A | Discharge status: Home / Self Care | Payer: BLUE CROSS/BLUE SHIELD | Source: Ambulatory Visit | Attending: Neurosurgery | Admitting: Neurosurgery

## 2018-11-19 DIAGNOSIS — G910 Communicating hydrocephalus: Secondary | ICD-10-CM

## 2018-11-19 NOTE — Patient Instructions (Signed)
  Please complete the assigned speech therapy homework prior to your next session and return it to the speech therapist at your next visit.  

## 2018-11-19 NOTE — Therapy (Signed)
[facility] [address] [place], [place], [address] Phone: [phone]   Fax:  [phone]  Speech Language Pathology Treatment  Patient Details  Name: Charles Marquez MRN: [id number] Date of Birth: January 28, 1956 Referring Provider (SLP): Dr. Posey Pronto   Encounter Date: 11/18/2018  End of Session - 11/19/18 1045    Visit Number  15    Number of Visits  17    Date for SLP Re-Evaluation  11/18/18    Authorization Type  BCBS 90 visits for all disciplines     SLP Start Time  1319    SLP Stop Time   1400    SLP Time Calculation (min)  41 min    Activity Tolerance  Patient tolerated treatment well       Past Medical History:  Diagnosis Date  . Anxiety   . Hypercholesteremia   . Stroke ([facility])    tia's  . TIA (transient ischemic attack)    09.15    Past Surgical History:  Procedure Laterality Date  . Fractured arm Left 12  . HERNIA REPAIR Right 3/12  . LAPAROSCOPIC REVISION VENTRICULAR-PERITONEAL (V-P) SHUNT N/A 08/26/2018   Procedure: LAPAROSCOPIC INSERTION VENTRICULAR-PERITONEAL (V-P) SHUNT;  Surgeon: Earnie Larsson, MD;  Location: [facility];  Service: Neurosurgery;  Laterality: N/A;  . LOOP RECORDER INSERTION N/A 04/10/2017   Procedure: Loop Recorder Insertion;  Surgeon: Thompson Grayer, MD;  Location: [facility];  Service: Cardiovascular;  Laterality: N/A;  . SHUNT REMOVAL Right 03/13/2016   Procedure: SHUNT REMOVAL;  Surgeon: Earnie Larsson, MD;  Location: [facility];  Service: Neurosurgery;  Laterality: Right;  . SHUNT REMOVAL Right 08/09/2018   Procedure: SHUNT REMOVAL With Placement of Ventricular Catheter;  Surgeon: Consuella Lose, MD;  Location: [facility];  Service: Neurosurgery;  Laterality: Right;  . SHUNT REVISION Right 08/05/2018   Procedure: SHUNT REVISION;  Surgeon: Earnie Larsson, MD;  Location: [facility];  Service: Neurosurgery;  Laterality: Right;  . SHUNT REVISION VENTRICULAR-PERITONEAL Left 08/26/2018   Procedure: SHUNT REVISION VENTRICULAR-PERITONEAL;  Surgeon: Earnie Larsson, MD;  Location: [facility];  Service: Neurosurgery;  Laterality: Left;  . SHUNT REVISION VENTRICULAR-PERITONEAL Left 09/02/2018   Procedure: Left Occipital VP shunt revision;  Surgeon: Earnie Larsson, MD;  Location: [facility];  Service: Neurosurgery;  Laterality: Left;  [name] VASECTOMY  10/02/1997  . VENTRICULOPERITONEAL SHUNT Right 12/18/2014   Procedure: Shunt Placment - right occipital VP shunt ;  Surgeon: Charlie Pitter, MD;  Location: [facility];  Service: Neurosurgery;  Laterality: Right;  Shunt Placment - right occipital VP shunt   . VENTRICULOPERITONEAL SHUNT Right 07/22/2018   Procedure: Shunt Placment right occipital;  Surgeon: Earnie Larsson, MD;  Location: [facility];  Service: Neurosurgery;  Laterality: Right;  . VENTRICULOPERITONEAL SHUNT Left 08/26/2018   Procedure: LEFT SIDED VENTRICULAR-PERITONEAL SHUNT;  Surgeon: Earnie Larsson, MD;  Location: [facility];  Service: Neurosurgery;  Laterality: Left;  [name] VENTRICULOSTOMY Right 08/09/2018   Procedure: VENTRICULOSTOMY;  Surgeon: Consuella Lose, MD;  Location: [facility];  Service: Neurosurgery;  Laterality: Right;    There were no vitals filed for this visit.  Subjective Assessment - 11/19/18 1043    Subjective  Pt carries in his own notebook. "My wife is teaching today"    Patient is accompained by:  --   friend, Charles Marquez   Currently in Pain?  No/denies            ADULT SLP TREATMENT - 11/19/18 0001      General Information  Behavior/Cognition  Alert;Cooperative;Pleasant mood      Treatment Provided   Treatment provided  Cognitive-Linquistic      Cognitive-Linquistic Treatment   Treatment focused on  Cognition    Skilled Treatment  Pt showed SLP his planner and SLP had him explain each section to SLP - pt did so efficiently with occasional question cues for clarification as pt's explanations were vague. Pt unable to problem solve how to check which section he failed to tell SLP about  (speech). SLP engaged pt in conversation for 12 mintues about his family and provided pt a paper and pencil to make visual representation of children and grandchildren as well as write some facts about them.  Again pt req'd cues to elaborate on descriptions of grandchildren.       Assessment / Recommendations / Plan   Plan  Continue with current plan of care      Progression Toward Goals   Progression toward goals  Progressing toward goals         SLP Short Term Goals - 11/12/18 1159      SLP SHORT TERM GOAL #1   Title  Pt will complete standardized assessment of cognition.    Status  Achieved      SLP SHORT TERM GOAL #2   Title  Pt will ID object/picture to simple description/feature/function f:4 with occasional min A over 3 sessions     Status  Partially Met      SLP SHORT TERM GOAL #3   Title  Pt will demo auditory comprehension of 5 minutes simple-mod complex conversation by responding appropriately or asking questions over 2 sessions    Status  Achieved      SLP SHORT TERM GOAL #4   Title  Pt will name basic objects/pictures 7/10 correct with occasional mod A over 3 sessions     Status  Partially Met      SLP SHORT TERM GOAL #5   Title  Pt will demo sustained attention for 10 minutes in simple cognitive-linguistic task x3 sessions.    Status  Achieved      SLP SHORT TERM GOAL #6   Title  Pt will have a memory system to assist in managing appointments, schedules, medical and therapy information and bring with him to 3 therapy sessions     Status  Achieved       SLP Long Term Goals - 11/19/18 1049      SLP LONG TERM GOAL #1   Title  Pt will write or name 5 items in a category with occasional min A over 4 sessions     Baseline  10/31/18, 11/04/18    Time  2    Period  Weeks    Status  On-going      SLP LONG TERM GOAL #2   Title  Pt will demo error awareness by attempting correction or by nonverbal response to errors 75% of the time over 3 sessions    Status   Achieved      SLP LONG TERM GOAL #3   Title  Pt will participate functionally in 10 minutes simple-mod complex conversation with conversational supports for aphasia over 3 sessions.    Status  Achieved      SLP LONG TERM GOAL #4   Title  Pt will demo selective attention in min noisy environment for 10 minutes in a simple-mod complex cognitive linguistic task over three sessions    Time  1    Period  Weeks  Status  On-going      SLP LONG TERM GOAL #5   Title  Pt will utilize memory compensation system to recall details/manage appointments, schedules, medical and therapy information with rare min A over 4 sessions      Time  1    Period  Weeks    Status  On-going       Plan - 11/19/18 1045    Clinical Impression Statement  Charles Marquez continues to present with aphasia, attention and memory impairments. In simple-mod complex conversation today, pt continued to have paraphasias and word-finding difficulties. In mod complex and complex conversation pt had incr'd frequency of word finding deficits along with paraphasias, and min auditory comprehension deficits to which pt showed decr'd awareness. Pt was vague with description tasks regarding salient items including family members and his cognitive/memory notebook and req'd usual cues for more details. Continue skilled ST to maximize communication and cognition for wants/needs, safety, independence and QOL.     Speech Therapy Frequency  2x / week    Duration  --   8 weeks or 17 visits   Treatment/Interventions  Cognitive reorganization;Multimodal communcation approach;Environmental controls;Compensatory strategies;Language facilitation;Compensatory techniques;Cueing hierarchy;Internal/external aids;Functional tasks;SLP instruction and feedback;Patient/family education    Potential to Achieve Goals  Good    Potential Considerations  Severity of impairments       Patient will benefit from skilled therapeutic intervention in order to improve  the following deficits and impairments:   Aphasia  Cognitive communication deficit    Problem List Patient Active Problem List   Diagnosis Date Noted  . Visual disturbance   . Slow transit constipation   . Hypoalbuminemia due to protein-calorie malnutrition ([facility])   . Acute blood loss anemia   . S/P VP shunt   . Hydrocephalus ([facility]) 08/30/2018  . Dyslipidemia   . History of CVA (cerebrovascular accident)   . Benign essential HTN   . Tachycardia   . Leukocytosis   . Hyponatremia   . Hypokalemia   . 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 Infection of ventricular shunt ([facility]) 08/09/2018  . Bacterial meningitis 08/09/2018  . TIA (transient ischemic attack) 01/29/2017  . Acute encephalopathy   . Shunt malfunction 03/13/2016  . 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 Hyperlipidemia 10/20/2014  . Degenerative disc disease, lumbar 04/15/2013  . Routine general medical examination at a health care facility 07/23/2012  . DISTURBANCE OF SKIN SENSATION 10/05/2008  . HYPERLIPIDEMIA 01/01/2008  . MYCOPLASMA PNEUMONIA 01/01/2008    [facility] ,[place], CCC-SLP  11/19/2018, 10:52 AM  [facility] [address] [place], [place], [address] Phone: [phone]   Fax:  [phone]   Name: Charles Marquez MRN: [id number] Date of Birth: 1956-02-08

## 2018-11-21 ENCOUNTER — Ambulatory Visit: Payer: BLUE CROSS/BLUE SHIELD

## 2018-11-21 ENCOUNTER — Ambulatory Visit: Payer: BLUE CROSS/BLUE SHIELD | Admitting: Speech Pathology

## 2018-11-21 DIAGNOSIS — M6281 Muscle weakness (generalized): Secondary | ICD-10-CM

## 2018-11-21 DIAGNOSIS — R2681 Unsteadiness on feet: Secondary | ICD-10-CM

## 2018-11-21 DIAGNOSIS — R4701 Aphasia: Secondary | ICD-10-CM

## 2018-11-21 DIAGNOSIS — R2689 Other abnormalities of gait and mobility: Secondary | ICD-10-CM | POA: Diagnosis not present

## 2018-11-21 DIAGNOSIS — R41841 Cognitive communication deficit: Secondary | ICD-10-CM

## 2018-11-21 NOTE — Therapy (Signed)
[facility] [address] [place], [place], [address] Phone: [phone]   Fax:  [phone]  Physical Therapy Treatment  Patient Details  Name: Charles Marquez MRN: [id number] Date of Birth: 04/04/1956 Referring Provider (PT): Dr. Posey Pronto    Encounter Date: 11/21/2018  PT End of Session - 11/21/18 1632    Visit Number  17    Number of Visits  23   original visit number: 75   Date for PT Re-Evaluation  01/20/19   original POC: 11/18/18   Authorization Type  BCBS OTHER     Authorization Time Period  PT/OT/ST/Hydro therapy covered x 90 days    PT Start Time  1535    PT Stop Time  1620    PT Time Calculation (min)  45 min    Equipment Utilized During Treatment  --   min guard to S prn   Activity Tolerance  Patient tolerated treatment well    Behavior During Therapy  [facility] for tasks assessed/performed       Past Medical History:  Diagnosis Date  . Anxiety   . Hypercholesteremia   . Stroke ([place])    tia's  . TIA (transient ischemic attack)    09.15    Past Surgical History:  Procedure Laterality Date  . Fractured arm Left 12  . HERNIA REPAIR Right 3/12  . LAPAROSCOPIC REVISION VENTRICULAR-PERITONEAL (V-P) SHUNT N/A 08/26/2018   Procedure: LAPAROSCOPIC INSERTION VENTRICULAR-PERITONEAL (V-P) SHUNT;  Surgeon: Earnie Larsson, MD;  Location: [facility];  Service: Neurosurgery;  Laterality: N/A;  . LOOP RECORDER INSERTION N/A 04/10/2017   Procedure: Loop Recorder Insertion;  Surgeon: Thompson Grayer, MD;  Location: [facility];  Service: Cardiovascular;  Laterality: N/A;  . SHUNT REMOVAL Right 03/13/2016   Procedure: SHUNT REMOVAL;  Surgeon: Earnie Larsson, MD;  [facility]: [facility];  Service: Neurosurgery;  Laterality: Right;  . SHUNT REMOVAL Right 08/09/2018   Procedure: SHUNT REMOVAL With Placement of Ventricular Catheter;  Surgeon: Consuella Lose, MD;  Location: [facility];  Service: Neurosurgery;  Laterality: Right;  . SHUNT  REVISION Right 08/05/2018   Procedure: SHUNT REVISION;  Surgeon: Earnie Larsson, MD;  Location: [facility];  Service: Neurosurgery;  Laterality: Right;  . SHUNT REVISION VENTRICULAR-PERITONEAL Left 08/26/2018   Procedure: SHUNT REVISION VENTRICULAR-PERITONEAL;  Surgeon: Earnie Larsson, MD;  Location: [facility];  Service: Neurosurgery;  Laterality: Left;  . SHUNT REVISION VENTRICULAR-PERITONEAL Left 09/02/2018   Procedure: Left Occipital VP shunt revision;  Surgeon: Earnie Larsson, MD;  Location: [facility];  Service: Neurosurgery;  Laterality: Left;  [name] VASECTOMY  10/02/1997  . VENTRICULOPERITONEAL SHUNT Right 12/18/2014   Procedure: Shunt Placment - right occipital VP shunt ;  Surgeon: Charlie Pitter, MD;  Location: [facility];  Service: Neurosurgery;  Laterality: Right;  Shunt Placment - right occipital VP shunt   . VENTRICULOPERITONEAL SHUNT Right 07/22/2018   Procedure: Shunt Placment right occipital;  Surgeon: Earnie Larsson, MD;  Location: [facility];  Service: Neurosurgery;  Laterality: Right;  . VENTRICULOPERITONEAL SHUNT Left 08/26/2018   Procedure: LEFT SIDED VENTRICULAR-PERITONEAL SHUNT;  Surgeon: Earnie Larsson, MD;  Location: [facility];  Service: Neurosurgery;  Laterality: Left;  [name] VENTRICULOSTOMY Right 08/09/2018   Procedure: VENTRICULOSTOMY;  Surgeon: Consuella Lose, MD;  Location: [facility];  Service: Neurosurgery;  Laterality: Right;    There were no vitals filed for this visit.  Subjective Assessment - 11/21/18 1540    Subjective  Pt denied falls or changes since last visit. Pt had  a CT on 11/19/18 and everything looked great.     Patient is accompained by:  Family member   wife: [name]   Pertinent History   S/p VP shunt, Hydrocephalus, CVA with loop recorder insertion 04/10/2017 as well as R occipital VP shunt placement 2016, Infection of ventricular shunt, shunt malfunction, HLD, anxiety, TIA, Hypercholesteremia, acute blood loss anemia, HTN, Tachycardia, Leukocytosis, Hyponatremia, Bacterial Encephalitis, Bacterial  Meningitis, Dyslipidemia, DDD lumbar spine, and Mycoplasma Pneumonia.     Patient Stated Goals  "Id love to be able to run backwards again to [employer] basketball games and improve my memory."    Currently in Pain?  No/denies          Neuro re-ed and therex: Access Code: [email or web address]  URL: [email or web address]  Date: 11/21/2018  Prepared by: Geoffry Paradise   Exercises  Side Stepping with Resistance at Thighs and Ankles - 4 reps - 1 sets - 1x daily - 3x weekly ([place]) Marching with Resistance - 4 reps - 1x daily - 3x weekly  (THEREX) Backward Walking with Counter Support - 4 reps - 2 sets - 1x daily - 2x weekly (NMR) Romberg Stance with Head Nods - 10 reps - 1x daily - 7x weekly (NMR) Romberg Stance with Head Rotation - 1 sets - 10 reps - 1x daily - 7x weekly (NMR)  Romberg Stance Eyes Closed on Foam Pad - 3 sets - 30 hold - 1x daily - 7x weekly (NMR) Tandem Stance with Head Rotation - 1 sets - 4 reps - 1x daily - 7x weekly (NMR) Standing Single Leg Stance with Counter Support - 3 sets - 10 SECONDS hold - 1x daily - 3x weekly (NMR) Walking Tandem Stance - 10 reps - 4 sets - 1x daily - 3x weekly (NMR) Toe Walking with Counter Support - 10 reps - 4 sets - 1x daily - 7x weekly (NMR) Heel Walking with Counter Support - 10 reps - 4 sets - 1x daily - 3x weekly (NMR) Half-Kneeling to Standing - 3 reps - 1x daily - 7x weekly (review ONLY)  Performed at counter or corner with intermittent UE support and min guard to S for safety. Cues and demo for technique. All standing balance in corner HEP progressed to compliant surface. No c/o pain during session.                      PT Education - 11/21/18 1631    Education Details  PT discussed recert and frequency (1x/week for 6 weeks for PT), pt and wife agreeable. PT reviewed and progressed HEP as tolerated.     Person(s) Educated  Patient;Spouse    Methods  Explanation;Demonstration;Tactile cues;Handout;Verbal cues     Comprehension  Returned demonstration;Verbalized understanding       PT Short Term Goals - 10/22/18 1127      PT SHORT TERM GOAL #1   Title  Pt will participate in establishment of initial HEP to improve strength, balance, and functional mobility.     Time  4    Period  Weeks    Status  Achieved      PT SHORT TERM GOAL #2   Title  Pt will ambulate 500 feet outdoors navigating uneven terrain, curbs, ramps, and perform dual cognitive tasks with therapist providing supervision to improve safety with community distances.     Time  4    Period  Weeks    Status  Achieved      PT SHORT TERM GOAL #3  Title  Pt will perform 12 steps with reciprocal stepping technique and single HR demonstrating improvement in safety with community accessibility with therapist providing supervision.     Time  4    Period  Weeks    Status  Achieved      PT SHORT TERM GOAL #4   Title  Pt will report no LOB episodes when walking over his gravel drive way indicating improvement in safety with functional mobility at home.    Time  4    Period  Weeks    Status  Achieved      PT SHORT TERM GOAL #5   Title  Pt will improve FGA score to >/= 20/30 indicating improvement in functional mobility.     Baseline  22/30 on 10/18/18    Time  4    Period  Weeks    Status  Achieved        PT Long Term Goals - 11/21/18 1645      PT LONG TERM GOAL #1   Title  Pt will be independent and compliant with performing PROGRESSED HEP to improve balance, strength, and functional mobility. ALL UNMET LTGS WILL BE CARRIED OVER TO NEW POC: 01/02/19    Status  Revised      PT LONG TERM GOAL #2   Title  Pt will ambulate 1000 feet outdoors at mod I level navigating uneven terrain, curbs, ramps, and perform dual cognitive tasks to increase safety with community distances.     Baseline  met per daughter's report - 11-14-18    Status  Achieved      PT LONG TERM GOAL #3   Title  Pt will demonstrate 12 steps with no HR's at mod I  level with good safety awareness to improve community accessibility.     Baseline  met 11-14-18    Status  Achieved      PT LONG TERM GOAL #4   Title  Pt will improve FGA score to >/=25/30 indicating improvement in functional mobility.     Baseline  score 23/30 - 11-14-18    Status  Not Met      PT LONG TERM GOAL #5   Title  Trial basketball/ref skills and write goals as indicated.     Status  New            Plan - 11/21/18 1545    Clinical Impression Statement  Today's skilled session focused on progressing HEP to continue pt's progress in incr. strength and improving balance. Pt continues to experience incr. postural sway during activities which require incr. vestibular input and required cues for technique 2/2 cognitive impairments. PT requesting additional 1x/week for 6 weeks to improve safety during functional mobility.     Rehab Potential  Good    Clinical Impairments Affecting Rehab Potential  Pt has short term memory deficits and cognitive changes from baseline which may negatively influence his ability to recall exercises and progressions in therapy.     PT Frequency  1x / week    PT Duration  6 weeks   original POC: 2x/week for 8 weeks   PT Treatment/Interventions  ADLs/Self Care Home Management;Therapeutic activities;Dry needling;Therapeutic exercise;Balance training;Neuromuscular re-education;Manual techniques;Visual/perceptual remediation/compensation;Patient/family education;Stair training;Gait training;Electrical Stimulation;Functional mobility training;Passive range of motion;DME Instruction    PT Next Visit Plan  Continue to work on balance with eyes closed.  activities with dual tasking.  Floor ladder.  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  high level balance and  strengthening in quadruped/ tall kneeling.    PT Home Exercise Plan  [email or web address]     Consulted and Agree with Plan of Care  Patient;Family  member/caregiver    Family Member Consulted  daughter       Patient will benefit from skilled therapeutic intervention in order to improve the following deficits and impairments:  Abnormal gait, Decreased coordination, Decreased safety awareness, Impaired vision/preception, Decreased balance, Decreased cognition, Decreased mobility, Decreased strength  Visit Diagnosis: Other abnormalities of gait and mobility - Plan: PT plan of care cert/re-cert  Unsteadiness on feet - Plan: PT plan of care cert/re-cert  Muscle weakness (generalized) - Plan: PT plan of care cert/re-cert     Problem List Patient Active Problem List   Diagnosis Date Noted  . Visual disturbance   . Slow transit constipation   . Hypoalbuminemia due to protein-calorie malnutrition ([place])   . Acute blood loss anemia   . S/P VP shunt   . Hydrocephalus ([place]) 08/30/2018  . Dyslipidemia   . History of CVA (cerebrovascular accident)   . Benign essential HTN   . Tachycardia   . Leukocytosis   . Hyponatremia   . Hypokalemia   . 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 Infection of ventricular shunt ([place]) 08/09/2018  . Bacterial meningitis 08/09/2018  . TIA (transient ischemic attack) 01/29/2017  . Acute encephalopathy   . Shunt malfunction 03/13/2016  . 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 Hyperlipidemia 10/20/2014  . Degenerative disc disease, lumbar 04/15/2013  . Routine general medical examination at a health care facility 07/23/2012  . DISTURBANCE OF SKIN SENSATION 10/05/2008  . HYPERLIPIDEMIA 01/01/2008  . MYCOPLASMA PNEUMONIA 01/01/2008    , L 11/21/2018, 4:50 PM  [facility] [address] [place], [place], [address] Phone: [phone]   Fax:  [phone]  Name: Charles Marquez MRN: [id number] Date of Birth: [birth year]  Geoffry Paradise, PT,DPT 11/21/18 4:53  PM Phone: [phone] Fax: [phone]

## 2018-11-21 NOTE — Patient Instructions (Signed)
Access Code: [email or web address]  URL: [email or web address]  Date: 11/21/2018  Prepared by: Zerita Boers   Exercises  Side Stepping with Resistance at Thighs and Ankles - 4 reps - 1 sets - 1x daily - 3x weekly  Marching with Resistance - 4 reps - 1x daily - 3x weekly  Backward Walking with Counter Support - 4 reps - 2 sets - 1x daily - 2x weekly  Romberg Stance with Head Nods - 10 reps - 1x daily - 7x weekly  Romberg Stance with Head Rotation - 1 sets - 10 reps - 1x daily - 7x weekly  Romberg Stance Eyes Closed on Foam Pad - 3 sets - 30 hold - 1x daily - 7x weekly  Tandem Stance with Head Rotation - 1 sets - 4 reps - 1x daily - 7x weekly  Standing Single Leg Stance with Counter Support - 3 sets - 10 SECONDS hold - 1x daily - 3x weekly  Walking Tandem Stance - 10 reps - 4 sets - 1x daily - 3x weekly  Toe Walking with Counter Support - 10 reps - 4 sets - 1x daily - 7x weekly  Heel Walking with Counter Support - 10 reps - 4 sets - 1x daily - 3x weekly  Half-Kneeling to Standing - 3 reps - 1x daily - 7x weekly (reviewed)

## 2018-11-22 ENCOUNTER — Ambulatory Visit: Payer: BLUE CROSS/BLUE SHIELD | Admitting: Physical Therapy

## 2018-11-22 NOTE — Therapy (Signed)
[facility] [address] [place], [place], [address] Phone: [phone]   Fax:  [phone]  Speech Language Pathology Treatment  Patient Details  Name: Charles Marquez MRN: [id number] Date of Birth: 07/01/1956 Referring Provider (SLP): Dr. Posey Pronto   Encounter Date: 11/21/2018  End of Session - 11/21/18 0928    Visit Number  16    Number of Visits  25    Date for SLP Re-Evaluation  01/17/19    Authorization Type  BCBS 90 visits for all disciplines     SLP Start Time  0801    SLP Stop Time   0847    SLP Time Calculation (min)  46 min    Activity Tolerance  Patient tolerated treatment well       Past Medical History:  Diagnosis Date  . Anxiety   . Hypercholesteremia   . Stroke ([place])    tia's  . TIA (transient ischemic attack)    09.15    Past Surgical History:  Procedure Laterality Date  . Fractured arm Left 12  . HERNIA REPAIR Right 3/12  . LAPAROSCOPIC REVISION VENTRICULAR-PERITONEAL (V-P) SHUNT N/A 08/26/2018   Procedure: LAPAROSCOPIC INSERTION VENTRICULAR-PERITONEAL (V-P) SHUNT;  Surgeon: Earnie Larsson, MD;  Location: [facility];  Service: Neurosurgery;  Laterality: N/A;  . LOOP RECORDER INSERTION N/A 04/10/2017   Procedure: Loop Recorder Insertion;  Surgeon: Thompson Grayer, MD;  Location: [facility];  Service: Cardiovascular;  Laterality: N/A;  . SHUNT REMOVAL Right 03/13/2016   Procedure: SHUNT REMOVAL;  Surgeon: Earnie Larsson, MD;  [facility];  Service: Neurosurgery;  Laterality: Right;  . SHUNT REMOVAL Right 08/09/2018   Procedure: SHUNT REMOVAL With Placement of Ventricular Catheter;  Surgeon: Consuella Lose, MD;  Location: [facility];  Service: Neurosurgery;  Laterality: Right;  . SHUNT REVISION Right 08/05/2018   Procedure: SHUNT REVISION;  Surgeon: Earnie Larsson, MD;  Location: [facility];  Service: Neurosurgery;  Laterality: Right;  . SHUNT REVISION VENTRICULAR-PERITONEAL Left 08/26/2018   Procedure: SHUNT REVISION VENTRICULAR-PERITONEAL;  Surgeon: Earnie Larsson, MD;  Location: [facility];  Service: Neurosurgery;  Laterality: Left;  . SHUNT REVISION VENTRICULAR-PERITONEAL Left 09/02/2018   Procedure: Left Occipital VP shunt revision;  Surgeon: Earnie Larsson, MD;  Location: [facility];  Service: Neurosurgery;  Laterality: Left;  [name] VASECTOMY  10/02/1997  . VENTRICULOPERITONEAL SHUNT Right 12/18/2014   Procedure: Shunt Placment - right occipital VP shunt ;  Surgeon: Charlie Pitter, MD;  Location: [facility];  Service: Neurosurgery;  Laterality: Right;  Shunt Placment - right occipital VP shunt   . VENTRICULOPERITONEAL SHUNT Right 07/22/2018   Procedure: Shunt Placment right occipital;  Surgeon: Earnie Larsson, MD;  Location: [facility];  Service: Neurosurgery;  Laterality: Right;  . VENTRICULOPERITONEAL SHUNT Left 08/26/2018   Procedure: LEFT SIDED VENTRICULAR-PERITONEAL SHUNT;  Surgeon: Earnie Larsson, MD;  Location: [facility];  Service: Neurosurgery;  Laterality: Left;  [name] VENTRICULOSTOMY Right 08/09/2018   Procedure: VENTRICULOSTOMY;  Surgeon: Consuella Lose, MD;  Location: [facility];  Service: Neurosurgery;  Laterality: Right;    There were no vitals filed for this visit.  Subjective Assessment - 11/21/18 0923    Subjective  Pt arrives with wife    Patient is accompained by:  Family member   wife   Currently in Pain?  No/denies            ADULT SLP TREATMENT - 11/21/18 0801      General Information   Behavior/Cognition  Alert;Cooperative;Pleasant mood  Treatment Provided   Treatment provided  Cognitive-Linquistic      Pain Assessment   Pain Assessment  No/denies pain      Cognitive-Linquistic Treatment   Treatment focused on  Aphasia;Cognition    Skilled Treatment  SLP targeted pt's reading comprehension, verbal and written expression, and memory in functional task, reading a high-interest article ([employer]) and having pt recall details, generate a written summary of the article. [name]  initial attempt at summary was vague and lengthy, with few key details from the initial article. SLP worked with pt to identify key topics from article and generate a list of "Who, When, and Why". Pt ID'd appropriate keywords for lists when re-reading the article with occasional min A question cues. Pt required mod cues for recall of information about article details when using list of keywords. Generated 3 sentences to summarize article with occasional mod cues, using his list of keywords as a reference.      Assessment / Recommendations / Plan   Plan  Continue with current plan of care;Goals updated      Progression Toward Goals   Progression toward goals  Progressing toward goals         SLP Short Term Goals - 11/21/18 0931      SLP SHORT TERM GOAL #1   Title  Pt will complete standardized assessment of cognition.    Status  Achieved      SLP SHORT TERM GOAL #2   Title  Pt will ID object/picture to simple description/feature/function f:4 with occasional min A over 3 sessions     Status  Partially Met      SLP SHORT TERM GOAL #3   Title  Pt will demo auditory comprehension of 5 minutes simple-mod complex conversation by responding appropriately or asking questions over 2 sessions    Status  Achieved      SLP SHORT TERM GOAL #4   Title  Pt will name basic objects/pictures 7/10 correct with occasional mod A over 3 sessions     Status  Partially Met      SLP SHORT TERM GOAL #5   Title  Pt will demo sustained attention for 10 minutes in simple cognitive-linguistic task x3 sessions.    Status  Achieved      SLP SHORT TERM GOAL #6   Title  Pt will have a memory system to assist in managing appointments, schedules, medical and therapy information and bring with him to 3 therapy sessions     Status  Achieved       SLP Long Term Goals - 11/21/18 0931      SLP LONG TERM GOAL #1   Title  Pt will write or name 5 words in personally relevant category with occasional min A over 4  sessions     Baseline  10/31/18, 11/04/18    Time  4   renewed/revised 11/21/18   Period  Weeks    Status  Revised      SLP LONG TERM GOAL #2   Title  Pt will demo error awareness by attempting correction or by nonverbal response to errors 75% of the time over 3 sessions    Status  Achieved      SLP LONG TERM GOAL #3   Title  Pt will participate functionally in 10 minutes simple-mod complex conversation with conversational supports for aphasia over 3 sessions.    Status  Achieved      SLP LONG TERM GOAL #4   Title  Pt will demo selective attention in min noisy environment for 10 minutes in a simple-mod complex cognitive linguistic task over three sessions    Time  --   renewed 11/21/18   Status  Deferred      SLP LONG TERM GOAL #5   Title  Pt will utilize memory compensation system to recall details/manage appointments, schedules, medical and therapy information with rare min A over 4 sessions      Time  4   renewed 11/21/18   Period  Weeks    Status  Revised      Additional Long Term Goals   Additional Long Term Goals  Yes      SLP LONG TERM GOAL #6   Title  Pt will demo WFL reading comprehension for high interest materials (texts, emails, news articles)by answering questions,summarizing material, or composing responses x3 sessions.    Time  4    Period  Weeks    Status  New      SLP LONG TERM GOAL #7   Title  Pt will generate appropriate written or typed responses to emails and text messages >90% accuracy (compensations allowed) x 3 sessions    Time  4    Period  Weeks    Status  New      SLP LONG TERM GOAL #8   Title  Pt will participate functionally in 10 minutes mod complex conversation with conversational supports for aphasia over 3 sessions.    Time  4    Period  Weeks    Status  New       Plan - 11/21/18 0929    Clinical Impression Statement  Charles Marquez continues to present with aphasia, attention and memory impairments. In simple-mod complex conversation  today, pt continued to have paraphasias and word-finding difficulties. In mod complex and complex conversation and simple written tasks, pt had incr'd frequency of word finding deficits along with paraphasias, and min auditory comprehension deficits to which pt showed decr'd awareness. Pt was vague with description tasks and written expression tasks regarding salient items required occasional mod cues for more details (with compensations). Continue skilled ST to maximize communication and cognition for wants/needs, safety, independence and QOL. Renewal completed today for an additional 4 weeks.     Speech Therapy Frequency  2x / week    Duration  --   25 total visits   Treatment/Interventions  Cognitive reorganization;Multimodal communcation approach;Environmental controls;Compensatory strategies;Language facilitation;Compensatory techniques;Cueing hierarchy;Internal/external aids;Functional tasks;SLP instruction and feedback;Patient/family education    Potential to Achieve Goals  Good    Potential Considerations  Severity of impairments    Consulted and Agree with Plan of Care  Patient;Family member/caregiver    Family Member Consulted  spouse       Patient will benefit from skilled therapeutic intervention in order to improve the following deficits and impairments:   Aphasia  Cognitive communication deficit    Problem List Patient Active Problem List   Diagnosis Date Noted  . Visual disturbance   . Slow transit constipation   . Hypoalbuminemia due to protein-calorie malnutrition ([facility])   . Acute blood loss anemia   . S/P VP shunt   . Hydrocephalus ([place]) 08/30/2018  . Dyslipidemia   . History of CVA (cerebrovascular accident)   . Benign essential HTN   . Tachycardia   . Leukocytosis   . Hyponatremia   . Hypokalemia   . 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 Infection of ventricular shunt ([place]) 08/09/2018  . Bacterial meningitis 08/09/2018  .  TIA (transient ischemic attack)  01/29/2017  . Acute encephalopathy   . Shunt malfunction 03/13/2016  . 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 Hyperlipidemia 10/20/2014  . Degenerative disc disease, lumbar 04/15/2013  . Routine general medical examination at a health care facility 07/23/2012  . DISTURBANCE OF SKIN SENSATION 10/05/2008  . HYPERLIPIDEMIA 01/01/2008  . MYCOPLASMA PNEUMONIA 01/01/2008   Deneise Lever, [place], [place] 11/22/2018, 11:31 AM  [facility] [address] [place], [place], [address] Phone: [phone]   Fax:  [phone]   Name: Charles Marquez MRN: [id number] Date of Birth: 10-25-1956

## 2018-11-25 ENCOUNTER — Ambulatory Visit: Payer: BLUE CROSS/BLUE SHIELD | Admitting: Speech Pathology

## 2018-11-25 DIAGNOSIS — R41841 Cognitive communication deficit: Secondary | ICD-10-CM

## 2018-11-25 DIAGNOSIS — R4701 Aphasia: Secondary | ICD-10-CM

## 2018-11-25 DIAGNOSIS — R2689 Other abnormalities of gait and mobility: Secondary | ICD-10-CM | POA: Diagnosis not present

## 2018-11-25 NOTE — Patient Instructions (Signed)
Using Your Daily Log to Help Your Memory  . Review your calendar with someone. o It may be helpful for someone to give you advice about information to record.  . Record information at the time it occurs.  o Don't wait until later to write down information - Ask others for a moment to record in your calendar . Record details from your day. o Write down what chores you do, places you go. o Write down information from conversations so you can follow up later. o Record information from appointments so you can remember what was said. . Review your calendar throughout the day o Morning: check plans, write "To Do" list o Evening: review day, make notes, plan for next day, transfer incomplete tasks to another day . Write in all appointments. o Eg. doctor, dentist, school, hairdresser . Write in weekly scheduled events. o Record things you do each week such as: your therapy schedule, exercise class, garbage day, Sunday school o Add scheduled events for things you have trouble remembering, such as: completing exercises, making phone calls, reviewing your budget . Write in upcoming events. o Birthdays, holidays, family activities, social events, bills due

## 2018-11-25 NOTE — Therapy (Signed)
[facility] [address] [place], [place], [address] Phone: [phone]   Fax:  [phone]  Speech Language Pathology Treatment  Patient Details  Name: Charles Marquez MRN: [id number] Date of Birth: [birth year] Referring Provider (SLP): Dr. Posey Pronto   Encounter Date: 11/25/2018  End of Session - 11/25/18 1559    Visit Number  17    Number of Visits  25    Date for SLP Re-Evaluation  01/17/19    Authorization Type  [employer] 90 visits for all disciplines     SLP Start Time  5456    SLP Stop Time   1445    SLP Time Calculation (min)  43 min    Activity Tolerance  Patient tolerated treatment well       Past Medical History:  Diagnosis Date  . Anxiety   . Hypercholesteremia   . Stroke ([place])    tia's  . TIA (transient ischemic attack)    09.15    Past Surgical History:  Procedure Laterality Date  . Fractured arm Left 12  . HERNIA REPAIR Right 3/12  . LAPAROSCOPIC REVISION VENTRICULAR-PERITONEAL (V-P) SHUNT N/A 08/26/2018   Procedure: LAPAROSCOPIC INSERTION VENTRICULAR-PERITONEAL (V-P) SHUNT;  Surgeon: Earnie Larsson, MD;  Location: [facility];  Service: Neurosurgery;  Laterality: N/A;  . LOOP RECORDER INSERTION N/A 04/10/2017   Procedure: Loop Recorder Insertion;  Surgeon: Thompson Grayer, MD;  Location: [facility];  Service: Cardiovascular;  Laterality: N/A;  . SHUNT REMOVAL Right 03/13/2016   Procedure: SHUNT REMOVAL;  Surgeon: Earnie Larsson, MD;  Location: [facility];  Service: Neurosurgery;  Laterality: Right;  . SHUNT REMOVAL Right 08/09/2018   Procedure: SHUNT REMOVAL With Placement of Ventricular Catheter;  Surgeon: Consuella Lose, MD;  Location: [facility];  Service: Neurosurgery;  Laterality: Right;  . SHUNT REVISION Right 08/05/2018   Procedure: SHUNT REVISION;  Surgeon: Earnie Larsson, MD;  Location: [facility];  Service: Neurosurgery;  Laterality: Right;  . SHUNT REVISION VENTRICULAR-PERITONEAL Left 08/26/2018   Procedure: SHUNT REVISION VENTRICULAR-PERITONEAL;  Surgeon: Earnie Larsson, MD;  Location: [facility];  Service: Neurosurgery;  Laterality: Left;  . SHUNT REVISION VENTRICULAR-PERITONEAL Left 09/02/2018   Procedure: Left Occipital VP shunt revision;  Surgeon: Earnie Larsson, MD;  Location: [facility];  Service: Neurosurgery;  Laterality: Left;  [name] VASECTOMY  10/02/1997  . VENTRICULOPERITONEAL SHUNT Right 12/18/2014   Procedure: Shunt Placment - right occipital VP shunt ;  Surgeon: Charlie Pitter, MD;  Location: [facility];  Service: Neurosurgery;  Laterality: Right;  Shunt Placment - right occipital VP shunt   . VENTRICULOPERITONEAL SHUNT Right 07/22/2018   Procedure: Shunt Placment right occipital;  Surgeon: Earnie Larsson, MD;  Location: [facility];  Service: Neurosurgery;  Laterality: Right;  . VENTRICULOPERITONEAL SHUNT Left 08/26/2018   Procedure: LEFT SIDED VENTRICULAR-PERITONEAL SHUNT;  Surgeon: Earnie Larsson, MD;  Location: [facility];  Service: Neurosurgery;  Laterality: Left;  [name] VENTRICULOSTOMY Right 08/09/2018   Procedure: VENTRICULOSTOMY;  Surgeon: Consuella Lose, MD;  Location: [facility];  Service: Neurosurgery;  Laterality: Right;    There were no vitals filed for this visit.         ADULT SLP TREATMENT - 11/25/18 1401      General Information   Behavior/Cognition  Alert;Cooperative;Pleasant mood      Treatment Provided   Treatment provided  Cognitive-Linquistic      Cognitive-Linquistic Treatment   Treatment focused on  Cognition    Skilled Treatment  Today pt unable to recall homework  or details from last session. SLP worked with pt on use of memory compensation system for recall of information. Pt has been inconsistently recording information in daily log. SLP worked with pt to [employer] and place log with his calendar. Instructed pt to record details each day. Pt recorded some details with cues from SLP, friend Joneen Boers assisting with recall, as pt unable to recall some specifics of his  morning. Pt wrote a note to himself about the type of information to record as well as why he should do this (writing vague/circumlocutory, with occasional mod cues needed).       Assessment / Recommendations / Plan   Plan  Continue with current plan of care;Goals updated      Progression Toward Goals   Progression toward goals  Progressing toward goals         SLP Short Term Goals - 11/21/18 0931      SLP SHORT TERM GOAL #1   Title  Pt will complete standardized assessment of cognition.    Status  Achieved      SLP SHORT TERM GOAL #2   Title  Pt will ID object/picture to simple description/feature/function f:4 with occasional min A over 3 sessions     Status  Partially Met      SLP SHORT TERM GOAL #3   Title  Pt will demo auditory comprehension of 5 minutes simple-mod complex conversation by responding appropriately or asking questions over 2 sessions    Status  Achieved      SLP SHORT TERM GOAL #4   Title  Pt will name basic objects/pictures 7/10 correct with occasional mod A over 3 sessions     Status  Partially Met      SLP SHORT TERM GOAL #5   Title  Pt will demo sustained attention for 10 minutes in simple cognitive-linguistic task x3 sessions.    Status  Achieved      SLP SHORT TERM GOAL #6   Title  Pt will have a memory system to assist in managing appointments, schedules, medical and therapy information and bring with him to 3 therapy sessions     Status  Achieved       SLP Long Term Goals - 11/25/18 1551      SLP LONG TERM GOAL #1   Title  Pt will write or name 5 words in personally relevant category with occasional min A over 4 sessions     Time  3   or 25 visits, for all LTGs   Period  Weeks    Status  On-going      SLP LONG TERM GOAL #2   Title  Pt will demo error awareness by attempting correction or by nonverbal response to errors 75% of the time over 3 sessions    Status  Achieved      SLP LONG TERM GOAL #3   Title  Pt will participate  functionally in 10 minutes simple-mod complex conversation with conversational supports for aphasia over 3 sessions.    Status  Achieved      SLP LONG TERM GOAL #4   Title  Pt will demo selective attention in min noisy environment for 10 minutes in a simple-mod complex cognitive linguistic task over three sessions    Status  Deferred      SLP LONG TERM GOAL #5   Title  Pt will utilize memory compensation system to recall details/manage appointments, schedules, medical and therapy information with rare min A over  4 sessions      Time  3    Period  Weeks    Status  On-going      SLP LONG TERM GOAL #6   Title  Pt will demo WFL reading comprehension for high interest materials (texts, emails, news articles)by answering questions,summarizing material, or composing responses x3 sessions.    Time  3    Period  Weeks    Status  On-going      SLP LONG TERM GOAL #7   Title  Pt will generate appropriate written or typed responses to emails and text messages >90% accuracy (compensations allowed) x 3 sessions    Time  3    Period  Weeks    Status  On-going      SLP LONG TERM GOAL #8   Title  Pt will participate functionally in 10 minutes mod complex conversation with conversational supports for aphasia over 3 sessions.    Time  3    Period  Weeks    Status  On-going       Plan - 11/25/18 1559    Clinical Impression Statement  Mr. Smolinsky continues to present with aphasia, attention and memory impairments. In simple-mod complex conversation today, pt continued to have paraphasias and word-finding difficulties. In mod complex and complex conversation and simple written tasks, pt had incr'd frequency of word finding deficits along with paraphasias, and min auditory comprehension deficits to which pt showed decr'd awareness. Occasional mod cues for recall of activities occurring earlier today; suspect pt anxiety about "getting it wrong" contributes to hesitation. Pt was vague with description tasks  and written expression tasks regarding salient items required occasional mod cues for more details (with compensations). Continue skilled ST to maximize communication and cognition for wants/needs, safety, independence and QOL.     Speech Therapy Frequency  2x / week    Duration  --   25 total visits   Treatment/Interventions  Cognitive reorganization;Multimodal communcation approach;Environmental controls;Compensatory strategies;Language facilitation;Compensatory techniques;Cueing hierarchy;Internal/external aids;Functional tasks;SLP instruction and feedback;Patient/family education    Potential to Achieve Goals  Good    Potential Considerations  Severity of impairments       Patient will benefit from skilled therapeutic intervention in order to improve the following deficits and impairments:   Cognitive communication deficit  Aphasia    Problem List Patient Active Problem List   Diagnosis Date Noted  . Visual disturbance   . Slow transit constipation   . Hypoalbuminemia due to protein-calorie malnutrition ([place])   . Acute blood loss anemia   . S/P VP shunt   . Hydrocephalus ([place]) 08/30/2018  . Dyslipidemia   . History of CVA (cerebrovascular accident)   . Benign essential HTN   . Tachycardia   . Leukocytosis   . Hyponatremia   . Hypokalemia   . 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 Infection of ventricular shunt ([place]) 08/09/2018  . Bacterial meningitis 08/09/2018  . TIA (transient ischemic attack) 01/29/2017  . Acute encephalopathy   . Shunt malfunction 03/13/2016  . 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 Hyperlipidemia 10/20/2014  . Degenerative disc disease, lumbar 04/15/2013  . Routine general medical examination at a health care facility 07/23/2012  . DISTURBANCE OF SKIN SENSATION 10/05/2008  . HYPERLIPIDEMIA 01/01/2008  . MYCOPLASMA PNEUMONIA 01/01/2008   Deneise Lever, [place], [place] 11/25/2018, 4:02 PM  [facility] [address] [place], [place],  [address] Phone: [phone]   Fax:  [phone]   Name: Brek Reece MRN: [id number] Date of Birth: 1956/02/14

## 2018-11-26 ENCOUNTER — Encounter: Payer: Self-pay | Admitting: Psychology

## 2018-11-26 ENCOUNTER — Encounter: Payer: BLUE CROSS/BLUE SHIELD | Attending: Psychology | Admitting: Psychology

## 2018-11-26 DIAGNOSIS — G3189 Other specified degenerative diseases of nervous system: Secondary | ICD-10-CM | POA: Diagnosis not present

## 2018-11-26 DIAGNOSIS — F4310 Post-traumatic stress disorder, unspecified: Secondary | ICD-10-CM | POA: Diagnosis present

## 2018-11-26 DIAGNOSIS — F09 Unspecified mental disorder due to known physiological condition: Secondary | ICD-10-CM | POA: Diagnosis present

## 2018-11-26 DIAGNOSIS — S069X0S Unspecified intracranial injury without loss of consciousness, sequela: Secondary | ICD-10-CM

## 2018-11-26 NOTE — Progress Notes (Signed)
Neuropsychological Consultation   Patient:   Charles Marquez   DOB:   December 20, 1955  MR Number:  [id number]  Location:  [facility] FOR PAIN AND [facility] MEDICINE [facility] PHYSICAL MEDICINE AND REHABILITATION [address] [place], [address] [id number] [facility] [place] [address] Dept: [phone]           Date of Service:   11/26/2018  Start Time:   10 AM End Time:   11 AM  Provider/Observer:  Arley Phenix, Psy.D.       Clinical Neuropsychologist       Billing Code/Service: Neurobehavioral status exam  Chief Complaint:    Charles Marquez is a 63 year old male referred for neuropsychological consultation due to coping and adjustment issues following significant issues from difficulties with his shunt.  The patient has had ongoing issues with aphasia, gait disturbance, and visual-spatial deficits.  The patient's wife reports that there have been multiple shunt infections and revisions.  While the patient's expressive language abilities and memory abilities have improved there continues to be issues with memory, expressive language, memory deficits related to sequencing and organization, gait disturbance and double vision.  Reason for Service:  Charles Marquez is a 63 year old male with a history of hyperlipidemia, CVA with loop recorder insertion, right occipital VP shunt in 2016.  The patient lives with his wife.  The patient had been working for [employer].  The patient had an admission on 08/04/2018 for unsteadiness of gait, nausea and vomiting, findings of malfunction VP shunt with revision 08/05/2018.  The patient was discharged home but was readmitted on 08/08/2018-08/22/2018 for VP shunt infection.  Once this is cleared up he again underwent removal of VP shunt.  The patient continued showed later mildly enlarged right lateral ventricle, but left ventricle third ventricle and fourth ventricle were small.  Later around 08/30/2018 there was suspected VP shunt malfunction.   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  The patient had follow-up care through the inpatient rehabilitation services at [facility].  The patient has been going through physical and occupational therapies as well as being followed up by Dr. Allena Katz.  The patient has shown some significant improvement in his aphasia, memory, motor functioning, and other cognitive functioning.  The patient is able to walk now and has been aggressively working on balance.  The patient worked for years with [employer] before going to work for [employer].  He had only worked there for 3 weeks when he had his first shot in 2016.  He was taken out of work and then did try to return to work but is not working now.  Current goals include learning to adapt to new life before all of these neurological difficulties.  The patient reports that he has been very active but there is been a huge change in his abilities as these cerebrovascular accident.  Current Status:  The patient has been showing some significant improvements in his cognitive functioning but continues to have gait disturbance memory issues, visual-spatial deficits.  Reliability of Information: Information is provided from 1 hour face-to-face clinical interview with the patient as well as review of available medical records.  Behavioral Observation: Charles Marquez  presents as a 63 y.o.-year-old Right Caucasian Male who appeared his stated age. his dress was Appropriate and he was Well Groomed and his manners were Appropriate to the situation.  his participation was indicative of Appropriate and Redirectable behaviors.  There were any physical disabilities noted.  he displayed an appropriate level of cooperation and motivation.     Interactions:    Active Appropriate  Attention:   abnormal and attention span appeared shorter than expected for age  Memory:   abnormal; recent memory intact, remote memory  impaired  Visuo-spatial:  abnormal  Speech (Volume):  low  Speech:   non-fluent aphasia;   Thought Process:  Coherent and Relevant  Though Content:  WNL; not suicidal  Orientation:   person, place, time/date and situation  Judgment:   Fair  Planning:   Fair  Affect:    Anxious  Mood:    Dysphoric  Insight:   Fair  Intelligence:   high  Marital Status/Living: Patient is married and continues to live with his wife.  Current Employment: The patient is not currently working due to his neurological deficits.  Past Employment:  The patient worked for many years for a [employer] and then went to work for [employer] shortly before he started with his neurological difficulties.  Substance Use:  No concerns of substance abuse are reported.    Education:   Control and instrumentation engineer History:   Past Medical History:  Diagnosis Date  . Anxiety   . Hypercholesteremia   . Stroke ([facility])    tia's  . 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  Family Med/Psych History:  Family History  Problem Relation Age of Onset  . COPD Mother   . Lung cancer Father   . Alzheimer's disease Father     Risk of Suicide/Violence: low the patient denies any suicidal or homicidal ideation.  Impression/DX:  Charles Marquez is a 63 year old male with a history of hyperlipidemia, CVA with loop recorder insertion, right occipital VP shunt in 2016.  The patient lives with his wife.  The patient had been working for [employer].  The patient had an admission on 08/04/2018 for unsteadiness of gait, nausea and vomiting, findings of malfunction VP shunt with revision 08/05/2018.  The patient was discharged home but was readmitted on 08/08/2018-08/22/2018 for VP shunt infection.  Once this is cleared up he again underwent removal of VP shunt.  The patient continued showed later mildly enlarged  right lateral ventricle, but left ventricle third ventricle and fourth ventricle were small.  Later around 08/30/2018 there was suspected VP shunt malfunction.  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  The patient had follow-up care through the inpatient rehabilitation services at [facility].  The patient has been going through physical and occupational therapies as well as being followed up by Dr. Allena Katz.  The patient has shown some significant improvement in his aphasia, memory, motor functioning, and other cognitive functioning.  The patient is able to walk now and has been aggressively working on balance.  The patient worked for years with [employer] before going to work for [employer].  He had only worked there for 3 weeks when he had his first shot in 2016.  He was taken out of work and then did try to return to work but is not working now.  Current goals include learning to adapt to new life before all of these neurological difficulties.  The patient reports that he has been very active but there is been a huge change in his abilities as these cerebrovascular accident.  Current Status:  The patient has been showing  some significant improvements in his   Disposition/Plan:  We have set the patient up for individual psychotherapeutic interventions.  Working on having the patient learn to adapt to life is different from 1 where he was very independent reactive.  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

## 2018-11-27 ENCOUNTER — Ambulatory Visit: Payer: BLUE CROSS/BLUE SHIELD | Admitting: Physical Therapy

## 2018-11-27 ENCOUNTER — Encounter: Payer: Self-pay | Admitting: Physical Therapy

## 2018-11-27 DIAGNOSIS — M6281 Muscle weakness (generalized): Secondary | ICD-10-CM

## 2018-11-27 DIAGNOSIS — R2689 Other abnormalities of gait and mobility: Secondary | ICD-10-CM

## 2018-11-27 DIAGNOSIS — R2681 Unsteadiness on feet: Secondary | ICD-10-CM

## 2018-11-27 NOTE — Therapy (Signed)
[facility] [address] [place], [place], [address] Phone: [phone]   Fax:  [phone]  Physical Therapy Treatment  Patient Details  Name: Charles Marquez MRN: [id number] Date of Birth: 11-Mar-1956 Referring Provider (PT): Dr. Posey Marquez    Encounter Date: 11/27/2018  PT End of Session - 11/27/18 1636    Visit Number  18    Number of Visits  23   original visit number: 61   Date for PT Re-Evaluation  01/20/19   original POC: 11/18/18   Authorization Type  [employer] OTHER     Authorization Time Period  PT/OT/ST/Hydro therapy covered x 90 days    PT Start Time  1532    PT Stop Time  1615    PT Time Calculation (min)  43 min    Equipment Utilized During Treatment  --   min guard to S prn   Activity Tolerance  Patient tolerated treatment well    Behavior During Therapy  [facility] for tasks assessed/performed       Past Medical History:  Diagnosis Date  . Anxiety   . Hypercholesteremia   . Stroke ([place])    tia's  . TIA (transient ischemic attack)    09.15    Past Surgical History:  Procedure Laterality Date  . Fractured arm Left 12  . HERNIA REPAIR Right 3/12  . LAPAROSCOPIC REVISION VENTRICULAR-PERITONEAL (V-P) SHUNT N/A 08/26/2018   Procedure: LAPAROSCOPIC INSERTION VENTRICULAR-PERITONEAL (V-P) SHUNT;  Surgeon: Earnie Larsson, MD;  Location: [facility];  Service: Neurosurgery;  Laterality: N/A;  . LOOP RECORDER INSERTION N/A 04/10/2017   Procedure: Loop Recorder Insertion;  Surgeon: Thompson Grayer, MD;  Location: [facility];  Service: Cardiovascular;  Laterality: N/A;  . SHUNT REMOVAL Right 03/13/2016   Procedure: SHUNT REMOVAL;  Surgeon: Earnie Larsson, MD;  [facility]: [facility];  Service: Neurosurgery;  Laterality: Right;  . SHUNT REMOVAL Right 08/09/2018   Procedure: SHUNT REMOVAL With Placement of Ventricular Catheter;  Surgeon: Consuella Lose, MD;  Location: [facility];  Service: Neurosurgery;  Laterality: Right;  .  SHUNT REVISION Right 08/05/2018   Procedure: SHUNT REVISION;  Surgeon: Earnie Larsson, MD;  Location: [facility];  Service: Neurosurgery;  Laterality: Right;  . SHUNT REVISION VENTRICULAR-PERITONEAL Left 08/26/2018   Procedure: SHUNT REVISION VENTRICULAR-PERITONEAL;  Surgeon: Earnie Larsson, MD;  Location: [facility];  Service: Neurosurgery;  Laterality: Left;  . SHUNT REVISION VENTRICULAR-PERITONEAL Left 09/02/2018   Procedure: Left Occipital VP shunt revision;  Surgeon: Earnie Larsson, MD;  Location: [facility];  Service: Neurosurgery;  Laterality: Left;  [name] VASECTOMY  10/02/1997  . VENTRICULOPERITONEAL SHUNT Right 12/18/2014   Procedure: Shunt Placment - right occipital VP shunt ;  Surgeon: Charlie Pitter, MD;  Location: [facility];  Service: Neurosurgery;  Laterality: Right;  Shunt Placment - right occipital VP shunt   . VENTRICULOPERITONEAL SHUNT Right 07/22/2018   Procedure: Shunt Placment right occipital;  Surgeon: Earnie Larsson, MD;  Location: [facility];  Service: Neurosurgery;  Laterality: Right;  . VENTRICULOPERITONEAL SHUNT Left 08/26/2018   Procedure: LEFT SIDED VENTRICULAR-PERITONEAL SHUNT;  Surgeon: Earnie Larsson, MD;  Location: [facility];  Service: Neurosurgery;  Laterality: Left;  [name] VENTRICULOSTOMY Right 08/09/2018   Procedure: VENTRICULOSTOMY;  Surgeon: Consuella Lose, MD;  Location: [facility];  Service: Neurosurgery;  Laterality: Right;    There were no vitals filed for this visit.  Subjective Assessment - 11/27/18 1533    Subjective  Denies falls or changes since last visit.  Patient is accompained by:  Family member   wife: Charles Marquez   Pertinent History   S/p VP shunt, Hydrocephalus, CVA with loop recorder insertion 04/10/2017 as well as R occipital VP shunt placement 2016, Infection of ventricular shunt, shunt malfunction, HLD, anxiety, TIA, Hypercholesteremia, acute blood loss anemia, HTN, Tachycardia, Leukocytosis, Hyponatremia, Bacterial Encephalitis, Bacterial Meningitis, Dyslipidemia, DDD lumbar spine, and  Mycoplasma Pneumonia.     Patient Stated Goals  "Id love to be able to run backwards again to [employer] basketball games and improve my memory."    Currently in Pain?  No/denies                       [facility] Adult PT Treatment/Exercise - 11/27/18 0001      Ambulation/Gait   Ambulation/Gait  Yes    Ambulation/Gait Assistance  5: Supervision    Ambulation/Gait Assistance Details  Min/min guard assist at times with high level balance gait    Ambulation Distance (Feet)  --   >500' plus during gait activities and also on treadmill   Assistive device  None;Other (Comment)   treadmill   Gait Pattern  Step-through pattern;Narrow base of support;Decreased dorsiflexion - left;Decreased dorsiflexion - right    Ambulation Surface  Level;Indoor    Gait Comments  Treadmill x 5 minutes with intermittent UE support working on step length and bil heel strike.  Tends to shuffle at times and foot flat.  Improves with cues but needs frequent reminders.      Posture/Postural Control   Posture/Postural Control  Postural limitations    Postural Limitations  Forward head;Rounded Shoulders    Posture Comments  Performed standing upright at wall x 2 minutes x 2 for posture awareness/positioning.  Discussed posture in relation to gait. and balance      High Level Balance   High Level Balance Activities  Braiding;Backward walking;Tandem walking    High Level Balance Comments  Pt tends to flex trunk and head forward with backward walking and has decreased bil step length causing lob posterior.  Encouraged pt to increase step length along with upright posture.  Decreased balance as well with braiding.  Has pt perform cognitive task with performing backward gait.  Forward gait with head turns-pt tends to move eyes only vs true head turns and needs cues to correct.            Balance Exercises - 11/27/18 1632      Balance Exercises: Standing   Other Standing Exercises  Standing on blue foam  for-head turns/nods with eyes open and eyes closed (needs 1 UE support with eyes open), marching on foam eyes open.  Standing on blue surface of bosu with intermittent UE support.  Standing on black surface of BOSU and performed mini squats x 10 without UE support.  Standing on foam then stepping one leg onto 6" step then tap to chair with opposite leg and back down while performing cognitive task.  Pt with increased difficulty maintaining physical task while performing cognitive task.          PT Short Term Goals - 10/22/18 1127      PT SHORT TERM GOAL #1   Title  Pt will participate in establishment of initial HEP to improve strength, balance, and functional mobility.     Time  4    Period  Weeks    Status  Achieved      PT SHORT TERM GOAL #2   Title  Pt will ambulate  500 feet outdoors navigating uneven terrain, curbs, ramps, and perform dual cognitive tasks with therapist providing supervision to improve safety with community distances.     Time  4    Period  Weeks    Status  Achieved      PT SHORT TERM GOAL #3   Title  Pt will perform 12 steps with reciprocal stepping technique and single HR demonstrating improvement in safety with community accessibility with therapist providing supervision.     Time  4    Period  Weeks    Status  Achieved      PT SHORT TERM GOAL #4   Title  Pt will report no LOB episodes when walking over his gravel drive way indicating improvement in safety with functional mobility at home.    Time  4    Period  Weeks    Status  Achieved      PT SHORT TERM GOAL #5   Title  Pt will improve FGA score to >/= 20/30 indicating improvement in functional mobility.     Baseline  22/30 on 10/18/18    Time  4    Period  Weeks    Status  Achieved        PT Long Term Goals - 11/21/18 1645      PT LONG TERM GOAL #1   Title  Pt will be independent and compliant with performing PROGRESSED HEP to improve balance, strength, and functional mobility. ALL UNMET LTGS  WILL BE CARRIED OVER TO NEW POC: 01/02/19    Status  Revised      PT LONG TERM GOAL #2   Title  Pt will ambulate 1000 feet outdoors at mod I level navigating uneven terrain, curbs, ramps, and perform dual cognitive tasks to increase safety with community distances.     Baseline  met per daughter's report - 11-14-18    Status  Achieved      PT LONG TERM GOAL #3   Title  Pt will demonstrate 12 steps with no HR's at mod I level with good safety awareness to improve community accessibility.     Baseline  met 11-14-18    Status  Achieved      PT LONG TERM GOAL #4   Title  Pt will improve FGA score to >/=25/30 indicating improvement in functional mobility.     Baseline  score 23/30 - 11-14-18    Status  Not Met      PT LONG TERM GOAL #5   Title  Trial basketball/ref skills and write goals as indicated.     Status  New            Plan - 11/27/18 1636    Clinical Impression Statement  Skilled session focused on balance and dynamic gait.  Pt continues with decreased balance with dynamic gait and high level balance.  Difficulty maintaining attention to physical task when challenged with cognitive task.  Continue PT per POC.    Rehab Potential  Good    Clinical Impairments Affecting Rehab Potential  Pt has short term memory deficits and cognitive changes from baseline which may negatively influence his ability to recall exercises and progressions in therapy.     PT Frequency  1x / week    PT Duration  6 weeks   original POC: 2x/week for 8 weeks   PT Treatment/Interventions  ADLs/Self Care Home Management;Therapeutic activities;Dry needling;Therapeutic exercise;Balance training;Neuromuscular re-education;Manual techniques;Visual/perceptual remediation/compensation;Patient/family education;Stair training;Gait training;Electrical Stimulation;Functional mobility training;Passive range of motion;DME Instruction  PT Next Visit Plan  Treadmill working on step length and foot clearance then carry over  to level ground.  Continue to work on balance with eyes closed.  activities with dual tasking.  Floor ladder.  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  high level balance and strengthening in quadruped/ tall kneeling.    PT Home Exercise Plan  [id number]     Consulted and Agree with Plan of Care  Patient;Family member/caregiver    Family Member Consulted  wife       Patient will benefit from skilled therapeutic intervention in order to improve the following deficits and impairments:  Abnormal gait, Decreased coordination, Decreased safety awareness, Impaired vision/preception, Decreased balance, Decreased cognition, Decreased mobility, Decreased strength  Visit Diagnosis: Other abnormalities of gait and mobility  Unsteadiness on feet  Muscle weakness (generalized)     Problem List Patient Active Problem List   Diagnosis Date Noted  . Visual disturbance   . Slow transit constipation   . Hypoalbuminemia due to protein-calorie malnutrition ([place])   . Acute blood loss anemia   . S/P VP shunt   . Hydrocephalus ([place]) 08/30/2018  . Dyslipidemia   . History of CVA (cerebrovascular accident)   . Benign essential HTN   . Tachycardia   . Leukocytosis   . Hyponatremia   . Hypokalemia   . 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 Infection of ventricular shunt ([place]) 08/09/2018  . Bacterial meningitis 08/09/2018  . TIA (transient ischemic attack) 01/29/2017  . Acute encephalopathy   . Shunt malfunction 03/13/2016  . 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 Hyperlipidemia 10/20/2014  . Degenerative disc disease, lumbar 04/15/2013  . Routine general medical examination at a health care facility 07/23/2012  . DISTURBANCE OF SKIN SENSATION 10/05/2008  . HYPERLIPIDEMIA 01/01/2008  . MYCOPLASMA PNEUMONIA 01/01/2008   Narda Bonds, PTA [facility] 11/27/18 4:39 PM Phone: [phone] Fax: [facility] [address] [place], [place], [address] Phone: [phone]   Fax:  [phone]  Name: Charles Marquez MRN: [id number] Date of Birth: October 21, 1956

## 2018-11-28 ENCOUNTER — Ambulatory Visit: Payer: BLUE CROSS/BLUE SHIELD | Admitting: Speech Pathology

## 2018-11-28 DIAGNOSIS — R4701 Aphasia: Secondary | ICD-10-CM

## 2018-11-28 DIAGNOSIS — R41841 Cognitive communication deficit: Secondary | ICD-10-CM

## 2018-11-28 DIAGNOSIS — R2689 Other abnormalities of gait and mobility: Secondary | ICD-10-CM | POA: Diagnosis not present

## 2018-11-29 ENCOUNTER — Ambulatory Visit: Payer: BLUE CROSS/BLUE SHIELD | Admitting: Physical Therapy

## 2018-11-29 NOTE — Therapy (Signed)
[facility] [address] [place], [place], [address] Phone: [phone]   Fax:  [phone]  Speech Language Pathology Treatment  Patient Details  Name: Charles Marquez MRN: [id number] Date of Birth: [birth year] Referring Provider (SLP): Dr. Posey Pronto   Encounter Date: 11/28/2018    Past Medical History:  Diagnosis Date  . Anxiety   . Hypercholesteremia   . Stroke ([place])    tia's  . TIA (transient ischemic attack)    09.15    Past Surgical History:  Procedure Laterality Date  . Fractured arm Left 12  . HERNIA REPAIR Right 3/12  . LAPAROSCOPIC REVISION VENTRICULAR-PERITONEAL (V-P) SHUNT N/A 08/26/2018   Procedure: LAPAROSCOPIC INSERTION VENTRICULAR-PERITONEAL (V-P) SHUNT;  Surgeon: Earnie Larsson, MD;  Location: [facility];  Service: Neurosurgery;  Laterality: N/A;  . LOOP RECORDER INSERTION N/A 04/10/2017   Procedure: Loop Recorder Insertion;  Surgeon: Thompson Grayer, MD;  Location: [facility];  Service: Cardiovascular;  Laterality: N/A;  . SHUNT REMOVAL Right 03/13/2016   Procedure: SHUNT REMOVAL;  Surgeon: Earnie Larsson, MD;  [facility];  Service: Neurosurgery;  Laterality: Right;  . SHUNT REMOVAL Right 08/09/2018   Procedure: SHUNT REMOVAL With Placement of Ventricular Catheter;  Surgeon: Consuella Lose, MD;  Location: [facility];  Service: Neurosurgery;  Laterality: Right;  . SHUNT REVISION Right 08/05/2018   Procedure: SHUNT REVISION;  Surgeon: Earnie Larsson, MD;  Location: [facility];  Service: Neurosurgery;  Laterality: Right;  . SHUNT REVISION VENTRICULAR-PERITONEAL Left 08/26/2018   Procedure: SHUNT REVISION VENTRICULAR-PERITONEAL;  Surgeon: Earnie Larsson, MD;  Location: [facility];  Service: Neurosurgery;  Laterality: Left;  . SHUNT REVISION VENTRICULAR-PERITONEAL Left 09/02/2018   Procedure: Left Occipital VP shunt revision;  Surgeon: Earnie Larsson, MD;  Location: [facility];  Service: Neurosurgery;  Laterality: Left;  [name]  VASECTOMY  10/02/1997  . VENTRICULOPERITONEAL SHUNT Right 12/18/2014   Procedure: Shunt Placment - right occipital VP shunt ;  Surgeon: Charlie Pitter, MD;  Location: [facility];  Service: Neurosurgery;  Laterality: Right;  Shunt Placment - right occipital VP shunt   . VENTRICULOPERITONEAL SHUNT Right 07/22/2018   Procedure: Shunt Placment right occipital;  Surgeon: Earnie Larsson, MD;  Location: [facility];  Service: Neurosurgery;  Laterality: Right;  . VENTRICULOPERITONEAL SHUNT Left 08/26/2018   Procedure: LEFT SIDED VENTRICULAR-PERITONEAL SHUNT;  Surgeon: Earnie Larsson, MD;  Location: [facility];  Service: Neurosurgery;  Laterality: Left;  [name] VENTRICULOSTOMY Right 08/09/2018   Procedure: VENTRICULOSTOMY;  Surgeon: Consuella Lose, MD;  Location: [facility];  Service: Neurosurgery;  Laterality: Right;    There were no vitals filed for this visit.  Subjective Assessment - 11/29/18 1455    Subjective  Pt shows SLP reorganized Log section    Patient is accompained by:  Family member   wife   Currently in Pain?  No/denies            ADULT SLP TREATMENT - 11/28/18 1530      General Information   Behavior/Cognition  Alert;Cooperative;Pleasant mood      Treatment Provided   Treatment provided  Cognitive-Linquistic      Pain Assessment   Pain Assessment  No/denies pain      Cognitive-Linquistic Treatment   Treatment focused on  Cognition;Aphasia    Skilled Treatment  Cognitive tx (14 minutes): Pt showed SLP his notes and recalled details from each day using his daily log section, extended time for navigation/locating appropriate sections. Written expression was functional for simple, concrete material. Pt  told SLP about a breakfast meeting he is planning to attend with former coworkers; noted this meeting had been rescheduled by reviewing his log notes. With extended time and occasional visual cues for alternating attention, pt recorded the time change on his daily calendar. Pt noted he is running out of  room on his calendar; SLP provided pt weekly planner sheets and pt requested copies to place in his binder. Speech tx (28 min): SLP facilitated mod complex conversation re: pt's progress/goals in therapy which he plans to discuss with friends at his breakfast meeting. [name] speech frequently vague/circumlocutory, with mod question cues required to elicit descriptions/explanations/examples. Pt recorded notes of what he planned to discuss at meeting. SLP educated re: compensations for aphasia, and that written keywords may be helpful for pt when he is speaking with a larger group. Pt agreed; to continue working on his outline at home.       Assessment / Recommendations / Plan   Plan  Continue with current plan of care;Goals updated      Progression Toward Goals   Progression toward goals  Progressing toward goals         SLP Short Term Goals - 11/21/18 0931      SLP SHORT TERM GOAL #1   Title  Pt will complete standardized assessment of cognition.    Status  Achieved      SLP SHORT TERM GOAL #2   Title  Pt will ID object/picture to simple description/feature/function f:4 with occasional min A over 3 sessions     Status  Partially Met      SLP SHORT TERM GOAL #3   Title  Pt will demo auditory comprehension of 5 minutes simple-mod complex conversation by responding appropriately or asking questions over 2 sessions    Status  Achieved      SLP SHORT TERM GOAL #4   Title  Pt will name basic objects/pictures 7/10 correct with occasional mod A over 3 sessions     Status  Partially Met      SLP SHORT TERM GOAL #5   Title  Pt will demo sustained attention for 10 minutes in simple cognitive-linguistic task x3 sessions.    Status  Achieved      SLP SHORT TERM GOAL #6   Title  Pt will have a memory system to assist in managing appointments, schedules, medical and therapy information and bring with him to 3 therapy sessions     Status  Achieved       SLP Long Term Goals - 11/29/18 1503       SLP LONG TERM GOAL #1   Title  Pt will write or name 5 words in personally relevant category with occasional min A over 4 sessions     Time  3    Period  Weeks    Status  On-going      SLP LONG TERM GOAL #2   Title  Pt will demo error awareness by attempting correction or by nonverbal response to errors 75% of the time over 3 sessions    Status  Achieved      SLP LONG TERM GOAL #3   Title  Pt will participate functionally in 10 minutes simple-mod complex conversation with conversational supports for aphasia over 3 sessions.    Status  Achieved      SLP LONG TERM GOAL #4   Title  Pt will demo selective attention in min noisy environment for 10 minutes in a simple-mod complex cognitive  linguistic task over three sessions    Status  Deferred      SLP LONG TERM GOAL #5   Title  Pt will utilize memory compensation system to recall details/manage appointments, schedules, medical and therapy information with rare min A over 4 sessions      Baseline  11/28/18    Time  3    Period  Weeks    Status  On-going      SLP LONG TERM GOAL #6   Title  Pt will demo WFL reading comprehension for high interest materials (texts, emails, news articles)by answering questions,summarizing material, or composing responses x3 sessions.    Time  3    Period  Weeks    Status  On-going      SLP LONG TERM GOAL #7   Title  Pt will generate appropriate written or typed responses to emails and text messages >90% accuracy (compensations allowed) x 3 sessions    Time  3    Period  Weeks    Status  On-going      SLP LONG TERM GOAL #8   Title  Pt will participate functionally in 10 minutes mod complex conversation with conversational supports for aphasia over 3 sessions.    Time  3    Period  Weeks    Status  On-going       Plan - 11/29/18 1510    Clinical Impression Statement  Mr. Dray continues to present with aphasia, attention and memory impairments. In simple-mod complex conversation today, pt  continued to have paraphasias and word-finding difficulties. Written expression improving/functional in simple written tasks today; In mod complex and complex conversation and more complex written tasks, pt had incr'd frequency of word finding deficits along with paraphasias. Recall of daily activities functional with compensatory aid; pt memory of these events without using compensations remains impaired. Continue skilled ST to maximize communication and cognition for wants/needs, safety, independence and QOL.    Speech Therapy Frequency  2x / week    Duration  --   25 total visits   Potential Considerations  Severity of impairments       Patient will benefit from skilled therapeutic intervention in order to improve the following deficits and impairments:   Cognitive communication deficit  Aphasia    Problem List Patient Active Problem List   Diagnosis Date Noted  . Visual disturbance   . Slow transit constipation   . Hypoalbuminemia due to protein-calorie malnutrition ([place])   . Acute blood loss anemia   . S/P VP shunt   . Hydrocephalus ([place]) 08/30/2018  . Dyslipidemia   . History of CVA (cerebrovascular accident)   . Benign essential HTN   . Tachycardia   . Leukocytosis   . Hyponatremia   . Hypokalemia   . 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 Infection of ventricular shunt ([place]) 08/09/2018  . Bacterial meningitis 08/09/2018  . TIA (transient ischemic attack) 01/29/2017  . Acute encephalopathy   . Shunt malfunction 03/13/2016  . 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 Hyperlipidemia 10/20/2014  . Degenerative disc disease, lumbar 04/15/2013  . Routine general medical examination at a health care facility 07/23/2012  . DISTURBANCE OF SKIN SENSATION 10/05/2008  . HYPERLIPIDEMIA 01/01/2008  . MYCOPLASMA PNEUMONIA 01/01/2008   Deneise Lever, [place], [place] 11/29/2018,  3:13 PM  [facility] [address] [place], [place], [address] Phone: [phone]  Fax:  [phone]   Name: Charles Marquez MRN: [id number] Date of Birth: May 25, 1956

## 2018-12-02 ENCOUNTER — Ambulatory Visit: Payer: BLUE CROSS/BLUE SHIELD | Admitting: Speech Pathology

## 2018-12-02 DIAGNOSIS — R41841 Cognitive communication deficit: Secondary | ICD-10-CM

## 2018-12-02 DIAGNOSIS — R2689 Other abnormalities of gait and mobility: Secondary | ICD-10-CM | POA: Diagnosis not present

## 2018-12-02 DIAGNOSIS — R4701 Aphasia: Secondary | ICD-10-CM

## 2018-12-02 NOTE — Therapy (Signed)
[facility] [address] [place], [place], [address] Phone: [phone]   Fax:  [phone]  Speech Language Pathology Treatment  Patient Details  Name: Charles Marquez MRN: [id number] Date of Birth: [birth year] Referring Provider (SLP): Dr. Posey Pronto   Encounter Date: 12/02/2018  End of Session - 12/02/18 1256    Visit Number  18    Number of Visits  25    Date for SLP Re-Evaluation  01/17/19    Authorization Type  [employer] 90 visits for all disciplines     SLP Start Time  1150    SLP Stop Time   1235    SLP Time Calculation (min)  45 min    Activity Tolerance  Patient tolerated treatment well       Past Medical History:  Diagnosis Date  . Anxiety   . Hypercholesteremia   . Stroke ([place])    tia's  . TIA (transient ischemic attack)    09.15    Past Surgical History:  Procedure Laterality Date  . Fractured arm Left 12  . HERNIA REPAIR Right 3/12  . LAPAROSCOPIC REVISION VENTRICULAR-PERITONEAL (V-P) SHUNT N/A 08/26/2018   Procedure: LAPAROSCOPIC INSERTION VENTRICULAR-PERITONEAL (V-P) SHUNT;  Surgeon: Earnie Larsson, MD;  Location: [facility];  Service: Neurosurgery;  Laterality: N/A;  . LOOP RECORDER INSERTION N/A 04/10/2017   Procedure: Loop Recorder Insertion;  Surgeon: Thompson Grayer, MD;  Location: [facility];  Service: Cardiovascular;  Laterality: N/A;  . SHUNT REMOVAL Right 03/13/2016   Procedure: SHUNT REMOVAL;  Surgeon: Earnie Larsson, MD;  Location: [facility];  Service: Neurosurgery;  Laterality: Right;  . SHUNT REMOVAL Right 08/09/2018   Procedure: SHUNT REMOVAL With Placement of Ventricular Catheter;  Surgeon: Consuella Lose, MD;  Location: [facility];  Service: Neurosurgery;  Laterality: Right;  . SHUNT REVISION Right 08/05/2018   Procedure: SHUNT REVISION;  Surgeon: Earnie Larsson, MD;  Location: [facility];  Service: Neurosurgery;  Laterality: Right;  . SHUNT REVISION VENTRICULAR-PERITONEAL Left 08/26/2018    Procedure: SHUNT REVISION VENTRICULAR-PERITONEAL;  Surgeon: Earnie Larsson, MD;  Location: [facility];  Service: Neurosurgery;  Laterality: Left;  . SHUNT REVISION VENTRICULAR-PERITONEAL Left 09/02/2018   Procedure: Left Occipital VP shunt revision;  Surgeon: Earnie Larsson, MD;  Location: [facility];  Service: Neurosurgery;  Laterality: Left;  [name] VASECTOMY  10/02/1997  . VENTRICULOPERITONEAL SHUNT Right 12/18/2014   Procedure: Shunt Placment - right occipital VP shunt ;  Surgeon: Charlie Pitter, MD;  Location: [facility];  Service: Neurosurgery;  Laterality: Right;  Shunt Placment - right occipital VP shunt   . VENTRICULOPERITONEAL SHUNT Right 07/22/2018   Procedure: Shunt Placment right occipital;  Surgeon: Earnie Larsson, MD;  Location: [facility];  Service: Neurosurgery;  Laterality: Right;  . VENTRICULOPERITONEAL SHUNT Left 08/26/2018   Procedure: LEFT SIDED VENTRICULAR-PERITONEAL SHUNT;  Surgeon: Earnie Larsson, MD;  Location: [facility];  Service: Neurosurgery;  Laterality: Left;  [name] VENTRICULOSTOMY Right 08/09/2018   Procedure: VENTRICULOSTOMY;  Surgeon: Consuella Lose, MD;  Location: [facility];  Service: Neurosurgery;  Laterality: Right;    There were no vitals filed for this visit.  Subjective Assessment - 12/02/18 1243    Subjective  Wife reports pt "off" today; new grandbaby in ICU    Patient is accompained by:  Family member   wife   Currently in Pain?  No/denies            ADULT SLP TREATMENT - 12/02/18 1150      General Information  Behavior/Cognition  Alert;Cooperative;Pleasant mood      Treatment Provided   Treatment provided  Cognitive-Linquistic      Pain Assessment   Pain Assessment  No/denies pain      Cognitive-Linquistic Treatment   Treatment focused on  Cognition;Aphasia    Skilled Treatment  Pt unable to complete homework over the weekend; new grandchild born and he spent weekend visiting baby in ICU. Pt reports difficulty with some TalkPath therapy exercises. Reviewed pt's account and  progress and ID'd abstract reasoning as area of focus. Pt often hesitates to select an answer if he is unsure if it is correct; wife reports pt sometimes texts her at work for help. In mod complex categorization task, SLP encouraged pt to make a choice, then reflect on his answer/use a new approach or different description if incorrect. SLP reinforced that making mistakes is part of learning and progress. Targeted working International aid/development worker with mod complex mixed math; pt required mod cues initially for use of compensations for attention. Updated pt's home exercise plan.       Assessment / Recommendations / Plan   Plan  Continue with current plan of care;Goals updated      Progression Toward Goals   Progression toward goals  Progressing toward goals         SLP Short Term Goals - 11/21/18 0931      SLP SHORT TERM GOAL #1   Title  Pt will complete standardized assessment of cognition.    Status  Achieved      SLP SHORT TERM GOAL #2   Title  Pt will ID object/picture to simple description/feature/function f:4 with occasional min A over 3 sessions     Status  Partially Met      SLP SHORT TERM GOAL #3   Title  Pt will demo auditory comprehension of 5 minutes simple-mod complex conversation by responding appropriately or asking questions over 2 sessions    Status  Achieved      SLP SHORT TERM GOAL #4   Title  Pt will name basic objects/pictures 7/10 correct with occasional mod A over 3 sessions     Status  Partially Met      SLP SHORT TERM GOAL #5   Title  Pt will demo sustained attention for 10 minutes in simple cognitive-linguistic task x3 sessions.    Status  Achieved      SLP SHORT TERM GOAL #6   Title  Pt will have a memory system to assist in managing appointments, schedules, medical and therapy information and bring with him to 3 therapy sessions     Status  Achieved       SLP Long Term Goals - 12/02/18 1253      SLP LONG TERM GOAL #1   Title  Pt will write or name 5 words  in personally relevant category with occasional min A over 4 sessions     Time  2    Status  Partially Met   functional; will focus on higher level writing and cognition     SLP LONG TERM GOAL #2   Title  Pt will demo error awareness by attempting correction or by nonverbal response to errors 75% of the time over 3 sessions    Status  Achieved      SLP LONG TERM GOAL #3   Title  Pt will participate functionally in 10 minutes simple-mod complex conversation with conversational supports for aphasia over 3 sessions.    Status  Achieved  SLP LONG TERM GOAL #4   Title  Pt will demo selective attention in min noisy environment for 10 minutes in a simple-mod complex cognitive linguistic task over three sessions    Status  Deferred      SLP LONG TERM GOAL #5   Title  Pt will utilize memory compensation system to recall details/manage appointments, schedules, medical and therapy information with rare min A over 4 sessions      Baseline  11/28/18    Time  2    Period  Weeks    Status  On-going      SLP LONG TERM GOAL #6   Title  Pt will demo WFL reading comprehension for high interest materials (texts, emails, news articles)by answering questions,summarizing material, or composing responses x3 sessions.    Time  2    Period  Weeks    Status  On-going      SLP LONG TERM GOAL #7   Title  Pt will generate appropriate written or typed responses to emails and text messages >90% accuracy (compensations allowed) x 3 sessions    Time  2    Period  Weeks    Status  On-going      SLP LONG TERM GOAL #8   Title  Pt will participate functionally in 10 minutes mod complex conversation with conversational supports for aphasia over 3 sessions.    Time  2    Period  Weeks    Status  On-going       Plan - 12/02/18 1301    Clinical Impression Statement  Mr. Yellowhair continues to present with aphasia, attention and memory impairments. Simple conversation functional; in mod complex and complex  conversation and more complex written tasks, pt has frequency of word finding deficits along with paraphasias. Recall of daily activities functional with compensatory aid; pt memory of these events without using compensations remains impaired.  Continue skilled ST to maximize communication and cognition for wants/needs, safety, independence and QOL.    Speech Therapy Frequency  2x / week    Duration  --   25 visits   Treatment/Interventions  Cognitive reorganization;Multimodal communcation approach;Environmental controls;Compensatory strategies;Language facilitation;Compensatory techniques;Cueing hierarchy;Internal/external aids;Functional tasks;SLP instruction and feedback;Patient/family education    Potential to Achieve Goals  Good    Potential Considerations  Severity of impairments       Patient will benefit from skilled therapeutic intervention in order to improve the following deficits and impairments:   Cognitive communication deficit  Aphasia    Problem List Patient Active Problem List   Diagnosis Date Noted  . Visual disturbance   . Slow transit constipation   . Hypoalbuminemia due to protein-calorie malnutrition ([facility])   . Acute blood loss anemia   . S/P VP shunt   . Hydrocephalus ([facility]) 08/30/2018  . Dyslipidemia   . History of CVA (cerebrovascular accident)   . Benign essential HTN   . Tachycardia   . Leukocytosis   . Hyponatremia   . Hypokalemia   . 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 Infection of ventricular shunt ([facility]) 08/09/2018  . Bacterial meningitis 08/09/2018  . TIA (transient ischemic attack) 01/29/2017  . Acute encephalopathy   . Shunt malfunction 03/13/2016  . 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 Hyperlipidemia 10/20/2014  . Degenerative disc disease, lumbar 04/15/2013  . Routine general medical examination at a health care facility 07/23/2012  . DISTURBANCE OF SKIN SENSATION 10/05/2008   . HYPERLIPIDEMIA 01/01/2008  . MYCOPLASMA PNEUMONIA  01/01/2008   Deneise Lever, [place], [place] 12/02/2018, 1:03 PM  [facility] [address] [place], [place], [address] Phone: [phone]   Fax:  [phone]   Name: Rodderick Holtzer MRN: [id number] Date of Birth: 1955/11/27

## 2018-12-04 ENCOUNTER — Ambulatory Visit: Payer: BLUE CROSS/BLUE SHIELD | Admitting: Physical Therapy

## 2018-12-04 ENCOUNTER — Other Ambulatory Visit: Payer: Self-pay | Admitting: Neurosurgery

## 2018-12-04 ENCOUNTER — Ambulatory Visit
Admission: RE | Admit: 2018-12-04 | Discharge: 2018-12-04 | Disposition: A | Discharge status: Home / Self Care | Payer: BLUE CROSS/BLUE SHIELD | Source: Ambulatory Visit | Attending: Neurosurgery | Admitting: Neurosurgery

## 2018-12-04 DIAGNOSIS — G910 Communicating hydrocephalus: Secondary | ICD-10-CM

## 2018-12-05 ENCOUNTER — Ambulatory Visit: Payer: BLUE CROSS/BLUE SHIELD | Admitting: Speech Pathology

## 2018-12-05 ENCOUNTER — Other Ambulatory Visit: Payer: Self-pay

## 2018-12-05 ENCOUNTER — Encounter: Payer: Self-pay | Admitting: Internal Medicine

## 2018-12-05 ENCOUNTER — Other Ambulatory Visit: Payer: Self-pay | Admitting: Neurosurgery

## 2018-12-05 NOTE — Progress Notes (Signed)
SDW-Pre-op call completed by pt spouse, [name]. Spouse denies that pt C/O SOB and chest pain. Spouse stated that pt is under the care of Dr. Johney FrameAllred, Cardiology. Spouse denies that pt had a stress test and cardiac cath. Spouse denies recent labs. Spouse stated that pt last dose of Plavix was Wednesday 12/04/2018. Spouse made aware to have pt stop taking vitamins, fish oil and herbal medications. Do not take any NSAIDs ie: Ibuprofen, Advil, Naproxen (Aleve), Motrin, BC and Goody Powder or any medication containing Aspirin. Spouse verbalized understanding of all pre-op instructions.

## 2018-12-05 NOTE — Anesthesia Preprocedure Evaluation (Signed)
Anesthesia Evaluation  Patient identified by MRN, date of birth, ID band Patient confused    Reviewed: Allergy & Precautions, H&P , NPO status , Patient's Chart, lab work & pertinent test results  Airway Mallampati: II  TM Distance: >3 FB Neck ROM: Full    Dental no notable dental hx. (+) Teeth Intact, Dental Advisory Given   Pulmonary neg pulmonary ROS,    Pulmonary exam normal breath sounds clear to auscultation       Cardiovascular hypertension, Pt. on medications  Rhythm:Regular Rate:Normal     Neuro/Psych Anxiety Hydrocephalus TIACVA, No Residual Symptoms    GI/Hepatic negative GI ROS, Neg liver ROS,   Endo/Other  negative endocrine ROS  Renal/GU negative Renal ROS  negative genitourinary   Musculoskeletal  (+) Arthritis , Osteoarthritis,    Abdominal   Peds negative pediatric ROS (+)  Hematology  (+) Blood dyscrasia, anemia ,   Anesthesia Other Findings   Reproductive/Obstetrics negative OB ROS                             Lab Results  Component Value Date   WBC 7.3 11/05/2018   HGB 12.6 (L) 11/05/2018   HCT 37.0 (L) 11/05/2018   MCV 93.5 11/05/2018   PLT 192 11/05/2018   Lab Results  Component Value Date   CREATININE 0.90 11/05/2018   BUN 13 11/05/2018   NA 139 11/05/2018   K 3.9 11/05/2018   CL 103 11/05/2018   CO2 25 11/05/2018    Anesthesia Physical  Anesthesia Plan  ASA: III and emergent  Anesthesia Plan: General   Post-op Pain Management:    Induction: Intravenous  PONV Risk Score and Plan: 3 and Ondansetron, Dexamethasone and Treatment may vary due to age or medical condition  Airway Management Planned: Oral ETT and LMA  Additional Equipment:   Intra-op Plan:   Post-operative Plan: Extubation in OR and Possible Post-op intubation/ventilation  Informed Consent: I have reviewed the patients History and Physical, chart, labs and discussed the  procedure including the risks, benefits and alternatives for the proposed anesthesia with the patient or authorized representative who has indicated his/her understanding and acceptance.     Dental advisory given  Plan Discussed with: CRNA, Anesthesiologist and Surgeon  Anesthesia Plan Comments:         Anesthesia Quick Evaluation

## 2018-12-06 ENCOUNTER — Encounter (HOSPITAL_COMMUNITY): Payer: Self-pay | Admitting: *Deleted

## 2018-12-06 ENCOUNTER — Encounter (HOSPITAL_COMMUNITY)
Admission: RE | Disposition: A | Discharge status: Home / Self Care | Payer: Self-pay | Source: Ambulatory Visit | Attending: Neurosurgery

## 2018-12-06 ENCOUNTER — Inpatient Hospital Stay (HOSPITAL_COMMUNITY): Payer: BLUE CROSS/BLUE SHIELD | Admitting: Anesthesiology

## 2018-12-06 ENCOUNTER — Observation Stay (HOSPITAL_COMMUNITY)
Admission: RE | Admit: 2018-12-06 | Discharge: 2018-12-06 | Disposition: A | Discharge status: Home / Self Care | Payer: BLUE CROSS/BLUE SHIELD | Source: Ambulatory Visit | Attending: Neurosurgery | Admitting: Neurosurgery

## 2018-12-06 ENCOUNTER — Ambulatory Visit: Payer: BLUE CROSS/BLUE SHIELD | Admitting: Physical Therapy

## 2018-12-06 DIAGNOSIS — F419 Anxiety disorder, unspecified: Secondary | ICD-10-CM | POA: Insufficient documentation

## 2018-12-06 DIAGNOSIS — T859XXA Unspecified complication of internal prosthetic device, implant and graft, initial encounter: Secondary | ICD-10-CM | POA: Diagnosis present

## 2018-12-06 DIAGNOSIS — T8501XA Breakdown (mechanical) of ventricular intracranial (communicating) shunt, initial encounter: Secondary | ICD-10-CM | POA: Diagnosis not present

## 2018-12-06 DIAGNOSIS — D649 Anemia, unspecified: Secondary | ICD-10-CM | POA: Diagnosis not present

## 2018-12-06 DIAGNOSIS — G9389 Other specified disorders of brain: Secondary | ICD-10-CM | POA: Diagnosis not present

## 2018-12-06 DIAGNOSIS — I10 Essential (primary) hypertension: Secondary | ICD-10-CM | POA: Diagnosis not present

## 2018-12-06 DIAGNOSIS — Z8673 Personal history of transient ischemic attack (TIA), and cerebral infarction without residual deficits: Secondary | ICD-10-CM | POA: Diagnosis not present

## 2018-12-06 DIAGNOSIS — G3184 Mild cognitive impairment, so stated: Secondary | ICD-10-CM | POA: Diagnosis not present

## 2018-12-06 DIAGNOSIS — Z801 Family history of malignant neoplasm of trachea, bronchus and lung: Secondary | ICD-10-CM | POA: Insufficient documentation

## 2018-12-06 DIAGNOSIS — R2681 Unsteadiness on feet: Secondary | ICD-10-CM | POA: Insufficient documentation

## 2018-12-06 DIAGNOSIS — G910 Communicating hydrocephalus: Secondary | ICD-10-CM | POA: Insufficient documentation

## 2018-12-06 DIAGNOSIS — E7800 Pure hypercholesterolemia, unspecified: Secondary | ICD-10-CM | POA: Insufficient documentation

## 2018-12-06 DIAGNOSIS — Y752 Prosthetic and other implants, materials and neurological devices associated with adverse incidents: Secondary | ICD-10-CM | POA: Diagnosis not present

## 2018-12-06 DIAGNOSIS — M1990 Unspecified osteoarthritis, unspecified site: Secondary | ICD-10-CM | POA: Insufficient documentation

## 2018-12-06 DIAGNOSIS — Z79899 Other long term (current) drug therapy: Secondary | ICD-10-CM | POA: Insufficient documentation

## 2018-12-06 DIAGNOSIS — T85618A Breakdown (mechanical) of other specified internal prosthetic devices, implants and grafts, initial encounter: Secondary | ICD-10-CM | POA: Diagnosis present

## 2018-12-06 DIAGNOSIS — Z825 Family history of asthma and other chronic lower respiratory diseases: Secondary | ICD-10-CM | POA: Insufficient documentation

## 2018-12-06 DIAGNOSIS — Z820 Family history of epilepsy and other diseases of the nervous system: Secondary | ICD-10-CM | POA: Insufficient documentation

## 2018-12-06 HISTORY — PX: SHUNT REVISION: SHX343

## 2018-12-06 LAB — CBC
HCT: 38.6 % — ABNORMAL LOW (ref 39.0–52.0)
HEMOGLOBIN: 12 g/dL — AB (ref 13.0–17.0)
MCH: 27.7 pg (ref 26.0–34.0)
MCHC: 31.1 g/dL (ref 30.0–36.0)
MCV: 89.1 fL (ref 80.0–100.0)
Platelets: 225 10*3/uL (ref 150–400)
RBC: 4.33 MIL/uL (ref 4.22–5.81)
RDW: 13.2 % (ref 11.5–15.5)
WBC: 7.2 10*3/uL (ref 4.0–10.5)
nRBC: 0 % (ref 0.0–0.2)

## 2018-12-06 SURGERY — SHUNT REVISION
Anesthesia: General | Laterality: Left

## 2018-12-06 MED ORDER — OXYCODONE HCL 5 MG/5ML PO SOLN: 5.0000 mg | Freq: Once | ORAL | Status: AC | PRN

## 2018-12-06 MED ORDER — PHENOL 1.4 % MT LIQD: 1.0000 | OROMUCOSAL | Status: DC | PRN

## 2018-12-06 MED ORDER — HEMOSTATIC AGENTS (NO CHARGE) OPTIME
TOPICAL | Status: DC | PRN
  Administered 2018-12-06: 1 via TOPICAL

## 2018-12-06 MED ORDER — MIDAZOLAM HCL 2 MG/2ML IJ SOLN
INTRAMUSCULAR | Status: AC
  Filled 2018-12-06: qty 2

## 2018-12-06 MED ORDER — HYDROCODONE-ACETAMINOPHEN 10-325 MG PO TABS: 2.0000 | ORAL_TABLET | ORAL | Status: DC | PRN

## 2018-12-06 MED ORDER — FENTANYL CITRATE (PF) 100 MCG/2ML IJ SOLN: 25.0000 ug | INTRAMUSCULAR | Status: DC | PRN

## 2018-12-06 MED ORDER — OCUVITE-LUTEIN PO CAPS: 1.0000 | ORAL_CAPSULE | Freq: Every day | ORAL | Status: DC

## 2018-12-06 MED ORDER — VANCOMYCIN HCL IN DEXTROSE 1-5 GM/200ML-% IV SOLN
INTRAVENOUS | Status: AC
  Filled 2018-12-06: qty 200

## 2018-12-06 MED ORDER — POLYVINYL ALCOHOL 1.4 % OP SOLN
1.0000 [drp] | Freq: Every day | OPHTHALMIC | Status: DC | PRN
  Filled 2018-12-06: qty 15

## 2018-12-06 MED ORDER — SODIUM CHLORIDE 0.9 % IV SOLN
INTRAVENOUS | Status: DC | PRN
  Administered 2018-12-06: 11:00:00

## 2018-12-06 MED ORDER — SODIUM CHLORIDE 0.9% FLUSH: 3.0000 mL | INTRAVENOUS | Status: DC | PRN

## 2018-12-06 MED ORDER — ONDANSETRON HCL 4 MG PO TABS: 4.0000 mg | ORAL_TABLET | Freq: Four times a day (QID) | ORAL | Status: DC | PRN

## 2018-12-06 MED ORDER — LIDOCAINE-EPINEPHRINE 1 %-1:100000 IJ SOLN
INTRAMUSCULAR | Status: AC
  Filled 2018-12-06: qty 1

## 2018-12-06 MED ORDER — DEXAMETHASONE SODIUM PHOSPHATE 10 MG/ML IJ SOLN: 10.0000 mg | INTRAMUSCULAR | Status: DC

## 2018-12-06 MED ORDER — OXYCODONE HCL 5 MG PO TABS
5.0000 mg | ORAL_TABLET | Freq: Once | ORAL | Status: AC | PRN
  Administered 2018-12-06: 5 mg via ORAL

## 2018-12-06 MED ORDER — SODIUM CHLORIDE 0.9 % IV SOLN: 250.0000 mL | INTRAVENOUS | Status: DC

## 2018-12-06 MED ORDER — SODIUM CHLORIDE 0.9% FLUSH: 3.0000 mL | Freq: Two times a day (BID) | INTRAVENOUS | Status: DC

## 2018-12-06 MED ORDER — OXYCODONE HCL 5 MG PO TABS
ORAL_TABLET | ORAL | Status: AC
  Filled 2018-12-06: qty 1

## 2018-12-06 MED ORDER — CEFAZOLIN SODIUM-DEXTROSE 2-3 GM-%(50ML) IV SOLR
INTRAVENOUS | Status: DC | PRN
  Administered 2018-12-06: 2 g via INTRAVENOUS

## 2018-12-06 MED ORDER — CHLORHEXIDINE GLUCONATE CLOTH 2 % EX PADS: 6.0000 | MEDICATED_PAD | Freq: Once | CUTANEOUS | Status: DC

## 2018-12-06 MED ORDER — THROMBIN 5000 UNITS EX SOLR
CUTANEOUS | Status: AC
  Filled 2018-12-06: qty 15000

## 2018-12-06 MED ORDER — THROMBIN 5000 UNITS EX SOLR
CUTANEOUS | Status: DC | PRN
  Administered 2018-12-06: 10000 [IU] via TOPICAL

## 2018-12-06 MED ORDER — BACITRACIN ZINC 500 UNIT/GM EX OINT
TOPICAL_OINTMENT | CUTANEOUS | Status: AC
  Filled 2018-12-06: qty 28.35

## 2018-12-06 MED ORDER — LIDOCAINE 2% (20 MG/ML) 5 ML SYRINGE
INTRAMUSCULAR | Status: DC | PRN
  Administered 2018-12-06: 100 mg via INTRAVENOUS

## 2018-12-06 MED ORDER — LACTATED RINGERS IV SOLN
INTRAVENOUS | Status: DC
  Administered 2018-12-06: 08:00:00 via INTRAVENOUS

## 2018-12-06 MED ORDER — SUGAMMADEX SODIUM 200 MG/2ML IV SOLN
INTRAVENOUS | Status: DC | PRN
  Administered 2018-12-06: 200 mg via INTRAVENOUS

## 2018-12-06 MED ORDER — HYDROCODONE-ACETAMINOPHEN 5-325 MG PO TABS: 1.0000 | ORAL_TABLET | ORAL | Status: DC | PRN

## 2018-12-06 MED ORDER — HYPROMELLOSE (GONIOSCOPIC) 2.5 % OP SOLN
1.0000 [drp] | OPHTHALMIC | Status: DC | PRN
  Filled 2018-12-06: qty 15

## 2018-12-06 MED ORDER — ROCURONIUM BROMIDE 10 MG/ML (PF) SYRINGE
PREFILLED_SYRINGE | INTRAVENOUS | Status: DC | PRN
  Administered 2018-12-06: 50 mg via INTRAVENOUS
  Administered 2018-12-06: 10 mg via INTRAVENOUS

## 2018-12-06 MED ORDER — PHENYLEPHRINE 40 MCG/ML (10ML) SYRINGE FOR IV PUSH (FOR BLOOD PRESSURE SUPPORT)
PREFILLED_SYRINGE | INTRAVENOUS | Status: DC | PRN
  Administered 2018-12-06: 120 ug via INTRAVENOUS

## 2018-12-06 MED ORDER — MEPERIDINE HCL 50 MG/ML IJ SOLN: 6.2500 mg | INTRAMUSCULAR | Status: DC | PRN

## 2018-12-06 MED ORDER — TRAMADOL HCL 50 MG PO TABS: 50.0000 mg | ORAL_TABLET | Freq: Every evening | ORAL | Status: DC | PRN

## 2018-12-06 MED ORDER — VANCOMYCIN HCL 1000 MG IV SOLR
INTRAVENOUS | Status: DC | PRN
  Administered 2018-12-06: 1000 mg via INTRAVENOUS

## 2018-12-06 MED ORDER — FAMOTIDINE 20 MG PO TABS: 20.0000 mg | ORAL_TABLET | Freq: Two times a day (BID) | ORAL | Status: DC

## 2018-12-06 MED ORDER — HYDROMORPHONE HCL 1 MG/ML IJ SOLN: 1.0000 mg | INTRAMUSCULAR | Status: DC | PRN

## 2018-12-06 MED ORDER — ONDANSETRON HCL 4 MG/2ML IJ SOLN
INTRAMUSCULAR | Status: DC | PRN
  Administered 2018-12-06: 4 mg via INTRAVENOUS

## 2018-12-06 MED ORDER — SALINE SPRAY 0.65 % NA SOLN
1.0000 | NASAL | Status: DC | PRN
  Filled 2018-12-06: qty 44

## 2018-12-06 MED ORDER — EPHEDRINE SULFATE-NACL 50-0.9 MG/10ML-% IV SOSY
PREFILLED_SYRINGE | INTRAVENOUS | Status: DC | PRN
  Administered 2018-12-06: 5 mg via INTRAVENOUS

## 2018-12-06 MED ORDER — DEXAMETHASONE SODIUM PHOSPHATE 10 MG/ML IJ SOLN
INTRAMUSCULAR | Status: DC | PRN
  Administered 2018-12-06: 10 mg via INTRAVENOUS

## 2018-12-06 MED ORDER — 0.9 % SODIUM CHLORIDE (POUR BTL) OPTIME
TOPICAL | Status: DC | PRN
  Administered 2018-12-06: 1000 mL

## 2018-12-06 MED ORDER — BACITRACIN ZINC 500 UNIT/GM EX OINT
TOPICAL_OINTMENT | CUTANEOUS | Status: DC | PRN
  Administered 2018-12-06: 1 via TOPICAL

## 2018-12-06 MED ORDER — PROPOFOL 10 MG/ML IV BOLUS
INTRAVENOUS | Status: AC
  Filled 2018-12-06: qty 40

## 2018-12-06 MED ORDER — FENTANYL CITRATE (PF) 100 MCG/2ML IJ SOLN
INTRAMUSCULAR | Status: DC | PRN
  Administered 2018-12-06 (×3): 50 ug via INTRAVENOUS

## 2018-12-06 MED ORDER — MENTHOL 3 MG MT LOZG: 1.0000 | LOZENGE | OROMUCOSAL | Status: DC | PRN

## 2018-12-06 MED ORDER — CEFAZOLIN SODIUM-DEXTROSE 2-4 GM/100ML-% IV SOLN: 2.0000 g | INTRAVENOUS | Status: DC

## 2018-12-06 MED ORDER — ACETAMINOPHEN 650 MG RE SUPP: 650.0000 mg | RECTAL | Status: DC | PRN

## 2018-12-06 MED ORDER — PROPOFOL 10 MG/ML IV BOLUS
INTRAVENOUS | Status: DC | PRN
  Administered 2018-12-06: 200 mg via INTRAVENOUS

## 2018-12-06 MED ORDER — ACETAMINOPHEN 325 MG PO TABS: 325.0000 mg | ORAL_TABLET | ORAL | Status: DC | PRN

## 2018-12-06 MED ORDER — ATORVASTATIN CALCIUM 40 MG PO TABS: 40.0000 mg | ORAL_TABLET | Freq: Every day | ORAL | Status: DC

## 2018-12-06 MED ORDER — FENTANYL CITRATE (PF) 250 MCG/5ML IJ SOLN
INTRAMUSCULAR | Status: AC
  Filled 2018-12-06: qty 5

## 2018-12-06 MED ORDER — CEFAZOLIN SODIUM-DEXTROSE 1-4 GM/50ML-% IV SOLN
1.0000 g | Freq: Three times a day (TID) | INTRAVENOUS | Status: DC
  Administered 2018-12-06: 1 g via INTRAVENOUS
  Filled 2018-12-06: qty 50

## 2018-12-06 MED ORDER — ACETAMINOPHEN 325 MG PO TABS: 650.0000 mg | ORAL_TABLET | ORAL | Status: DC | PRN

## 2018-12-06 MED ORDER — VANCOMYCIN HCL 1000 MG IV SOLR
INTRAVENOUS | Status: AC
  Filled 2018-12-06: qty 1000

## 2018-12-06 MED ORDER — CYCLOBENZAPRINE HCL 10 MG PO TABS: 10.0000 mg | ORAL_TABLET | Freq: Three times a day (TID) | ORAL | Status: DC | PRN

## 2018-12-06 MED ORDER — ACETAMINOPHEN 160 MG/5ML PO SOLN: 325.0000 mg | ORAL | Status: DC | PRN

## 2018-12-06 MED ORDER — ONDANSETRON HCL 4 MG/2ML IJ SOLN: 4.0000 mg | Freq: Once | INTRAMUSCULAR | Status: DC | PRN

## 2018-12-06 MED ORDER — ONDANSETRON HCL 4 MG/2ML IJ SOLN: 4.0000 mg | Freq: Four times a day (QID) | INTRAMUSCULAR | Status: DC | PRN

## 2018-12-06 SURGICAL SUPPLY — 67 items
BAG DECANTER FOR FLEXI CONT (MISCELLANEOUS) ×3 IMPLANT
BANDAGE ADH SHEER 1  50/CT (GAUZE/BANDAGES/DRESSINGS) ×9 IMPLANT
BENZOIN TINCTURE PRP APPL 2/3 (GAUZE/BANDAGES/DRESSINGS) ×3 IMPLANT
BLADE SURG 11 STRL SS (BLADE) ×3 IMPLANT
BNDG GAUZE ELAST 4 BULKY (GAUZE/BANDAGES/DRESSINGS) IMPLANT
BUR ACORN 6.0 PRECISION (BURR) ×2 IMPLANT
BUR ACORN 6.0MM PRECISION (BURR) ×1
CANISTER SUCT 3000ML PPV (MISCELLANEOUS) ×3 IMPLANT
CARTRIDGE OIL MAESTRO DRILL (MISCELLANEOUS) ×1 IMPLANT
CATH VENTRICULAR 9CM (Shunt) ×3 IMPLANT
CLIP RANEY DISP (INSTRUMENTS) IMPLANT
CLOSURE WOUND 1/2 X4 (GAUZE/BANDAGES/DRESSINGS) ×2
COVER WAND RF STERILE (DRAPES) IMPLANT
DERMABOND ADVANCED (GAUZE/BANDAGES/DRESSINGS) ×2
DERMABOND ADVANCED .7 DNX12 (GAUZE/BANDAGES/DRESSINGS) ×1 IMPLANT
DIFFUSER DRILL AIR PNEUMATIC (MISCELLANEOUS) ×3 IMPLANT
DRAPE INCISE IOBAN 85X60 (DRAPES) ×3 IMPLANT
DRAPE ORTHO SPLIT 77X108 STRL (DRAPES) ×4
DRAPE SURG 17X23 STRL (DRAPES) IMPLANT
DRAPE SURG ORHT 6 SPLT 77X108 (DRAPES) ×2 IMPLANT
DRSG OPSITE 4X5.5 SM (GAUZE/BANDAGES/DRESSINGS) ×6 IMPLANT
DRSG OPSITE POSTOP 4X6 (GAUZE/BANDAGES/DRESSINGS) ×6 IMPLANT
ELECT REM PT RETURN 9FT ADLT (ELECTROSURGICAL) ×3
ELECTRODE REM PT RTRN 9FT ADLT (ELECTROSURGICAL) ×1 IMPLANT
GAUZE 4X4 16PLY RFD (DISPOSABLE) IMPLANT
GLOVE ECLIPSE 9.0 STRL (GLOVE) ×9 IMPLANT
GLOVE EXAM NITRILE XL STR (GLOVE) IMPLANT
GOWN STRL REUS W/ TWL LRG LVL3 (GOWN DISPOSABLE) IMPLANT
GOWN STRL REUS W/ TWL XL LVL3 (GOWN DISPOSABLE) IMPLANT
GOWN STRL REUS W/TWL 2XL LVL3 (GOWN DISPOSABLE) IMPLANT
GOWN STRL REUS W/TWL LRG LVL3 (GOWN DISPOSABLE)
GOWN STRL REUS W/TWL XL LVL3 (GOWN DISPOSABLE)
HEMOSTAT SURGICEL 2X14 (HEMOSTASIS) IMPLANT
KIT BASIN OR (CUSTOM PROCEDURE TRAY) ×3 IMPLANT
KIT TURNOVER KIT B (KITS) ×3 IMPLANT
MARKER SKIN DUAL TIP RULER LAB (MISCELLANEOUS) ×3 IMPLANT
NEEDLE HYPO 22GX1.5 SAFETY (NEEDLE) ×3 IMPLANT
NS IRRIG 1000ML POUR BTL (IV SOLUTION) ×3 IMPLANT
OIL CARTRIDGE MAESTRO DRILL (MISCELLANEOUS) ×3
PACK LAMINECTOMY NEURO (CUSTOM PROCEDURE TRAY) ×3 IMPLANT
PAD ARMBOARD 7.5X6 YLW CONV (MISCELLANEOUS) ×3 IMPLANT
RUBBERBAND STERILE (MISCELLANEOUS) IMPLANT
SHEATH PERITONEAL INTRO 46 (MISCELLANEOUS) IMPLANT
SHEATH PERITONEAL INTRO 61 (MISCELLANEOUS) IMPLANT
SPONGE INTESTINAL PEANUT (DISPOSABLE) IMPLANT
SPONGE LAP 4X18 RFD (DISPOSABLE) IMPLANT
SPONGE SURGIFOAM ABS GEL SZ50 (HEMOSTASIS) ×3 IMPLANT
STAPLER VISISTAT 35W (STAPLE) ×3 IMPLANT
STRIP CLOSURE SKIN 1/2X4 (GAUZE/BANDAGES/DRESSINGS) ×4 IMPLANT
SUT CHROMIC 3 0 SH 27 (SUTURE) IMPLANT
SUT ETHILON 3 0 FSL (SUTURE) ×3 IMPLANT
SUT ETHILON 4 0 PS 2 18 (SUTURE) IMPLANT
SUT NURALON 4 0 TR CR/8 (SUTURE) IMPLANT
SUT SILK 0 TIES 10X30 (SUTURE) IMPLANT
SUT SILK 2 0 TIES 17X18 (SUTURE) ×2
SUT SILK 2-0 18XBRD TIE BLK (SUTURE) ×1 IMPLANT
SUT SILK 3 0 SH 30 (SUTURE) IMPLANT
SUT VIC AB 2-0 CT2 18 VCP726D (SUTURE) ×3 IMPLANT
SUT VIC AB 3-0 SH 8-18 (SUTURE) ×3 IMPLANT
SUT VICRYL 4-0 PS2 18IN ABS (SUTURE) IMPLANT
SYR 5ML LL (SYRINGE) IMPLANT
SYR CONTROL 10ML LL (SYRINGE) ×3 IMPLANT
TOWEL GREEN STERILE (TOWEL DISPOSABLE) ×3 IMPLANT
TOWEL GREEN STERILE FF (TOWEL DISPOSABLE) ×3 IMPLANT
TRAY FOLEY MTR SLVR 16FR STAT (SET/KITS/TRAYS/PACK) IMPLANT
UNDERPAD 30X30 (UNDERPADS AND DIAPERS) ×3 IMPLANT
WATER STERILE IRR 1000ML POUR (IV SOLUTION) ×3 IMPLANT

## 2018-12-06 NOTE — Anesthesia Procedure Notes (Signed)
Procedure Name: Intubation Date/Time: 12/06/2018 10:33 AM Performed by: Bethena Midget, MD Pre-anesthesia Checklist: Patient identified, Emergency Drugs available, Suction available, Patient being monitored and Timeout performed Patient Re-evaluated:Patient Re-evaluated prior to induction Oxygen Delivery Method: Circle system utilized Preoxygenation: Pre-oxygenation with 100% oxygen Induction Type: IV induction Ventilation: Mask ventilation without difficulty Laryngoscope Size: Miller and 3 Grade View: Grade I Tube type: Oral Tube size: 7.5 mm Number of attempts: 1 Airway Equipment and Method: Stylet Placement Confirmation: ETT inserted through vocal cords under direct vision,  positive ETCO2 and CO2 detector Secured at: 23 cm Tube secured with: Tape Dental Injury: Teeth and Oropharynx as per pre-operative assessment

## 2018-12-06 NOTE — Progress Notes (Signed)
Patient alert and oriented, mae's well, voiding adequate amount of urine, swallowing without difficulty, no c/o pain at time of discharge. Patient discharged home with family. Script and discharged instructions given to patient. Patient and family stated understanding of instructions given. Patient has an appointment with Dr. Pool  

## 2018-12-06 NOTE — Discharge Summary (Signed)
Physician Discharge Summary  Patient ID: Charles Marquez MRN: [id number] DOB/AGE: October 26, 1956 63 y.o.  Admit date: 12/06/2018 Discharge date: 12/06/2018  Admission Diagnoses:  Discharge Diagnoses:  Active Problems:   Shunt malfunction   Discharged Condition: good  Hospital Course: Patient admitted to the hospital where he underwent uncomplicated left occipital VP shunt revision.  Postoperatively doing well.  No headache.  Improved level of consciousness.  Still with some mild confusion.  Patient and his wife feel comfortable with him being discharged home.  Consults:   Significant Diagnostic Studies:   Treatments:   Discharge Exam: Blood pressure 112/74, pulse 65, temperature 97.6 F (36.4 C), resp. rate 19, SpO2 97 %. Awake and alert.  Oriented and reasonably appropriate.  Speech is fluent.  Motor and sensory function intact.  Wound clean and dry.  Disposition: Discharge disposition: 01-Home or Self Care        Allergies as of 12/06/2018   No Known Allergies     Medication List    STOP taking these medications   clopidogrel 75 MG tablet Commonly known as:  PLAVIX     TAKE these medications   acetaminophen 325 MG tablet Commonly known as:  TYLENOL Take 2 tablets (650 mg total) by mouth every 4 (four) hours as needed for mild pain (temp > 100.5).   atorvastatin 40 MG tablet Commonly known as:  LIPITOR Take 1 tablet (40 mg total) by mouth daily.   famotidine 20 MG tablet Commonly known as:  PEPCID Take 1 tablet (20 mg total) by mouth 2 (two) times daily.   hydroxypropyl methylcellulose / hypromellose 2.5 % ophthalmic solution Commonly known as:  ISOPTO TEARS / GONIOVISC Place 1 drop into both eyes as needed for dry eyes.   OCUVITE EYE HEALTH FORMULA Caps Take 1 capsule by mouth daily.   sodium chloride 0.65 % Soln nasal spray Commonly known as:  OCEAN Place 1 spray into both nostrils as needed for congestion.   traMADol 50 MG tablet Commonly known  as:  ULTRAM Take 1 tablet (50 mg total) by mouth at bedtime as needed (pain).   ZYRTEC PO Take 1 tablet by mouth daily.        Signed: Kathaleen Maser Charles Marquez 12/06/2018, 6:31 PM

## 2018-12-06 NOTE — Anesthesia Postprocedure Evaluation (Signed)
Anesthesia Post Note  Patient: Oswaldo Sailor  Procedure(s) Performed: Shunt Revision - left (Left )     Patient location during evaluation: PACU Anesthesia Type: General Level of consciousness: awake and alert Pain management: pain level controlled Vital Signs Assessment: post-procedure vital signs reviewed and stable Respiratory status: spontaneous breathing, nonlabored ventilation, respiratory function stable and patient connected to nasal cannula oxygen Cardiovascular status: blood pressure returned to baseline and stable Postop Assessment: no apparent nausea or vomiting Anesthetic complications: no    Last Vitals:  Vitals:   12/06/18 1250 12/06/18 1316  BP: 130/77 (!) 144/87  Pulse: 60 60  Resp: 16 19  Temp: 36.4 C   SpO2: 97% 99%    Last Pain:  Vitals:   12/06/18 1250  TempSrc:   PainSc: 0-No pain                 Florrie Ramires

## 2018-12-06 NOTE — H&P (Signed)
Charles SquibbKurt Edward Marquez is an 63 y.o. male.   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  Patient status post replacement of VP shunt with good improvement of his symptoms however the patient has lately noted cognitive decline with increasing levels of confusion.  Also having difficulty with unsteady gait.  Work-up demonstrates evidence of probable proximal VP shunt malfunction with increasing ventricular size on serial CT scans.  Patient presents now for VP shunt revision.  Past Medical History:  Diagnosis Date  . Anxiety   . Hypercholesteremia   . Stroke (HCC)    tia's  . TIA (transient ischemic attack)    09.15    Past Surgical History:  Procedure Laterality Date  . Fractured arm Left 12  . HERNIA REPAIR Right 3/12  . LAPAROSCOPIC REVISION VENTRICULAR-PERITONEAL (V-P) SHUNT N/A 08/26/2018   Procedure: LAPAROSCOPIC INSERTION VENTRICULAR-PERITONEAL (V-P) SHUNT;  Surgeon: Julio SicksPool, Laurice Kimmons, MD;  Location: [facility] OR;  Service: Neurosurgery;  Laterality: N/A;  . LOOP RECORDER INSERTION N/A 04/10/2017   Procedure: Loop Recorder Insertion;  Surgeon: Hillis RangeAllred, James, MD;  Location: [facility] INVASIVE CV LAB;  Service: Cardiovascular;  Laterality: N/A;  . SHUNT REMOVAL Right 03/13/2016   Procedure: SHUNT REMOVAL;  Surgeon: Julio SicksHenry Kedrick Mcnamee, MD;  Location: [facility] NEURO ORS;  Service: Neurosurgery;  Laterality: Right;  . SHUNT REMOVAL Right 08/09/2018   Procedure: SHUNT REMOVAL With Placement of Ventricular Catheter;  Surgeon: Lisbeth RenshawNundkumar, Neelesh, MD;  [facility];  Service: Neurosurgery;  Laterality: Right;  . SHUNT REVISION Right 08/05/2018   Procedure: SHUNT REVISION;  Surgeon: Julio SicksPool, Kathya Wilz, MD;  [facility];  Service: Neurosurgery;  Laterality: Right;  . SHUNT REVISION VENTRICULAR-PERITONEAL Left 08/26/2018   Procedure: SHUNT REVISION VENTRICULAR-PERITONEAL;  Surgeon: Julio SicksPool, Aleksis Jiggetts, MD;   [facility];  Service: Neurosurgery;  Laterality: Left;  . SHUNT REVISION VENTRICULAR-PERITONEAL Left 09/02/2018   Procedure: Left Occipital VP shunt revision;  Surgeon: Julio SicksPool, Shaakira Borrero, MD;  [facility];  Service: Neurosurgery;  Laterality: Left;  [name]. VASECTOMY  10/02/1997  . VENTRICULOPERITONEAL SHUNT Right 12/18/2014   Procedure: Shunt Placment - right occipital VP shunt ;  Surgeon: Temple PaciniHenry A Nekeya Briski, MD;  Location: [facility] NEURO ORS;  Service: Neurosurgery;  Laterality: Right;  Shunt Placment - right occipital VP shunt   . VENTRICULOPERITONEAL SHUNT Right 07/22/2018   Procedure: Shunt Placment right occipital;  Surgeon: Julio SicksPool, Mychaela Lennartz, MD;  [facility];  Service: Neurosurgery;  Laterality: Right;  . VENTRICULOPERITONEAL SHUNT Left 08/26/2018   Procedure: LEFT SIDED VENTRICULAR-PERITONEAL SHUNT;  Surgeon: Julio SicksPool, Kerstyn Coryell, MD;  [facility];  Service: Neurosurgery;  Laterality: Left;  [name]. VENTRICULOSTOMY Right 08/09/2018   Procedure: VENTRICULOSTOMY;  Surgeon: Lisbeth RenshawNundkumar, Neelesh, MD;  [facility];  Service: Neurosurgery;  Laterality: Right;    Family History  Problem Relation Age of Onset  . COPD Mother   . Lung cancer Father   . Alzheimer's disease Father    Social History:  reports that he has never smoked. He has never used smokeless tobacco. He reports current alcohol use. He reports that he does not use drugs.  Allergies: No Known Allergies  Medications Prior to Admission  Medication Sig Dispense Refill  . acetaminophen (TYLENOL) 325 MG tablet Take 2 tablets (650 mg total) by mouth every 4 (four) hours as needed for mild pain (temp > 100.5).    [name]. atorvastatin (LIPITOR) 40 MG tablet Take 1 tablet (40 mg total) by mouth daily. 90  tablet 3  . Cetirizine HCl (ZYRTEC PO) Take 1 tablet by mouth daily.    . clopidogrel (PLAVIX) 75 MG tablet Take 75 mg by mouth daily.    . hydroxypropyl methylcellulose / hypromellose (ISOPTO TEARS / GONIOVISC) 2.5 % ophthalmic solution Place 1 drop into both eyes  as needed for dry eyes.    . Multiple Vitamins-Minerals (OCUVITE EYE HEALTH FORMULA) CAPS Take 1 capsule by mouth daily.    . sodium chloride (OCEAN) 0.65 % SOLN nasal spray Place 1 spray into both nostrils as needed for congestion.    . famotidine (PEPCID) 20 MG tablet Take 1 tablet (20 mg total) by mouth 2 (two) times daily. (Patient not taking: Reported on 11/05/2018) 60 tablet 0  . traMADol (ULTRAM) 50 MG tablet Take 1 tablet (50 mg total) by mouth at bedtime as needed (pain). 30 tablet 0    Results for orders placed or performed during the hospital encounter of 12/06/18 (from the past 48 hour(s))  CBC     Status: Abnormal   Collection Time: 12/06/18  7:52 AM  Result Value Ref Range   WBC 7.2 4.0 - 10.5 K/uL   RBC 4.33 4.22 - 5.81 MIL/uL   Hemoglobin 12.0 (L) 13.0 - 17.0 g/dL   HCT 86.5 (L) 78.4 - 69.6 %   MCV 89.1 80.0 - 100.0 fL   MCH 27.7 26.0 - 34.0 pg   MCHC 31.1 30.0 - 36.0 g/dL   RDW 29.5 28.4 - 13.2 %   Platelets 225 150 - 400 K/uL   nRBC 0.0 0.0 - 0.2 %    Comment: Performed at [facility], [address] [place], [place] [address]   Ct Head Wo Contrast  Result Date: 12/05/2018 CLINICAL DATA:  Cognitive changes. History of chronic communicating hydrocephalus cephalocele, status post shunt revision October 2019. EXAM: CT HEAD WITHOUT CONTRAST TECHNIQUE: Contiguous axial images were obtained from the base of the skull through the vertex without intravenous contrast. COMPARISON:  CT HEAD November 19, 2018 FINDINGS: BRAIN: Worsening ventriculomegaly: LEFT temporal horn is 2.1 cm in transaxial dimension, previously 1.3 cm. Increasing periventricular hypodensity compatible with interstitial edema. Stable position of ventriculoperitoneal shunt via LEFT parietal approach with edema and gliosis along catheter tract. RIGHT frontal and RIGHT parietal encephalomalacia along old catheter tracts. a new LEFT frontal subcortical hypodensity. No intraparenchymal hemorrhage, midline shift  or acute large vascular territory infarcts. Narrowed basal cisterns. No extra-axial fluid collections. VASCULAR: Unremarkable. SKULL: No skull fracture. Multiple old burr holes. No significant scalp soft tissue swelling. SINUSES/ORBITS: Trace paranasal sinus mucosal thickening. Mastoid air cells are well aerated.The included ocular globes and orbital contents are non-suspicious. OTHER: None. IMPRESSION: 1. Acute moderate to severe hydrocephalus, stable appearance of LEFT VP shunt. 2. New LEFT frontal subcortical edema, recommend close attention on follow-up imaging. 3. These results will be called to the ordering clinician or representative by the professional radiologist assistant, and communication documented in zVision Dashboard. Electronically Signed   By: Awilda Metro M.D.   On: 12/05/2018 01:50    Pertinent items noted in HPI and remainder of comprehensive ROS otherwise negative.  Blood pressure 133/84, pulse (!) 58, temperature 98.2 F (36.8 C), temperature source Oral, resp. rate 18, SpO2 97 %.  Patient is awake and alert.  He is oriented to person and place.  Orientation to time is intermittent.  Examination of his speech finds it to be fluent.  Cranial nerve function with some mild peripheral visual loss bilaterally.  Extraocular movements  are full.  Facial movement and sensation normal bilateral.  Motor examination 5/5 bilaterally.  Sensory examination nonfocal.  Neck supple.  Wounds well-healed.  Examination head reveals left-sided VP shunt system.  The shunt valve pumps and refills slowly.  Remainder of her shunt track is non-erythemic or swollen.  Examination of his chest and abdomen are benign.  Extremities are free from injury deformity. Assessment/Plan Left occipital VP shunt malfunction.  Plan reexploration of VP shunt with shunt revision.  Risks and benefits of been explained.  Patient wishes to proceed.  Sherilyn Cooter A Murvin Gift 12/06/2018, 9:22 AM

## 2018-12-06 NOTE — Evaluation (Signed)
Physical Therapy Evaluation & Discharge Patient Details Name: Charles Marquez MRN: [id number] DOB: 20-Jan-1956 Today's Date: 12/06/2018   History of Present Illness  Pt is a 63 y.o. male s/p shunt revision 12/06/18. 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     Clinical Impression  Patient evaluated by Physical Therapy with no further acute PT needs identified. PTA, pt indep with mobility, lives with supportive wife; has been working with outpatient neuro PT on higher level balance and cognitive tasks. Today, pt ambulatory with supervision for balance. Pt currently with receptive/expressive difficulties, decreased attention and poor problem solving; wife notes cognition already improved since before surgery today. All education has been completed and the patient has no further questions. Recommend continued outpatient neuro PT. Acute PT is signing off. Thank you for this referral.    Follow Up Recommendations Outpatient PT(neuro)    Equipment Recommendations  None recommended by PT    Recommendations for Other Services       Precautions / Restrictions Precautions Precautions: Fall Restrictions Weight Bearing Restrictions: No      Mobility  Bed Mobility Overal bed mobility: Independent                Transfers Overall transfer level: Independent                  Ambulation/Gait Ambulation/Gait assistance: Supervision Gait Distance (Feet): 500 Feet Assistive device: None Gait Pattern/deviations: Step-through pattern;Decreased stride length Gait velocity: Decreased Gait velocity interpretation: 1.31 - 2.62 ft/sec, indicative of limited community ambulator General Gait Details: Slightly unsteady gait without DME, pt able to self-correct instability. Difficulty multitasking during ambulation; unable to navigate to stairwell or back to room despite cues  Stairs Stairs: Yes Stairs  assistance: Supervision Stair Management: One rail Right;Forwards;Alternating pattern Number of Stairs: 9 General stair comments: Good technique with single rail support; supervision for safety  Wheelchair Mobility    Modified Rankin (Stroke Patients Only)       Balance Overall balance assessment: Needs assistance   Sitting balance-Leahy Scale: Good       Standing balance-Leahy Scale: Good                               Pertinent Vitals/Pain Pain Assessment: No/denies pain    Home Living Family/patient expects to be discharged to:: Private residence Living Arrangements: Spouse/significant other Available Help at Discharge: Family;Available 24 hours/day Type of Home: House Home Access: Stairs to enter Entrance Stairs-Rails: Can reach both Entrance Stairs-Number of Steps: 5 Home Layout: Two level;1/2 bath on main level;Bed/bath upstairs Home Equipment: Walker - 2 wheels;Cane - single point      Prior Function Level of Independence: Needs assistance   Gait / Transfers Assistance Needed: Has been working with OP neuro PT on higher level balance and cognition. Wife noted worsening cog deficits immediately prior to sx           Hand Dominance   Dominant Hand: Right    Extremity/Trunk Assessment   Upper Extremity Assessment Upper Extremity Assessment: Overall WFL for tasks assessed    Lower Extremity Assessment Lower Extremity Assessment: Overall WFL for tasks assessed    Cervical / Trunk Assessment Cervical / Trunk Assessment: Normal  Communication   Communication: Receptive difficulties;Expressive difficulties  Cognition Arousal/Alertness: Awake/alert Behavior During Therapy: WFL for tasks assessed/performed Overall Cognitive Status: Impaired/Different from baseline Area of Impairment: Orientation;Attention;Memory;Following commands;Safety/judgement;Problem solving  Orientation Level: Disoriented to;Place(able to state  [place] with cues) Current Attention Level: Selective Memory: Decreased short-term memory Following Commands: Follows one step commands inconsistently   Awareness: Emergent Problem Solving: Slow processing;Decreased initiation;Requires verbal cues;Difficulty sequencing;Requires tactile cues General Comments: Working with OP neuro PT on higher level cognitive tasks. Immediately PTA, wife noticed worsening memory and communication difficulties, but notes improvement today. Pt with continued expressive/receptive communication difficulties; difficulty multitasking and word finding      General Comments General comments (skin integrity, edema, etc.): Wife present and supportive    Exercises     Assessment/Plan    PT Assessment All further PT needs can be met in the next venue of care  PT Problem List Decreased activity tolerance;Decreased balance;Decreased cognition;Decreased coordination;Decreased safety awareness       PT Treatment Interventions      PT Goals (Current goals can be found in the Care Plan section)  Acute Rehab PT Goals Patient Stated Goal: Return home and continue with OP Neuro PT PT Goal Formulation: With patient/family Time For Goal Achievement: 12/20/18 Potential to Achieve Goals: Good    Frequency     Barriers to discharge        Co-evaluation               AM-PAC PT "6 Clicks" Mobility  Outcome Measure Help needed turning from your back to your side while in a flat bed without using bedrails?: None Help needed moving from lying on your back to sitting on the side of a flat bed without using bedrails?: None Help needed moving to and from a bed to a chair (including a wheelchair)?: None Help needed standing up from a chair using your arms (e.g., wheelchair or bedside chair)?: A Little Help needed to walk in hospital room?: A Little Help needed climbing 3-5 steps with a railing? : A Little 6 Click Score: 21    End of Session  Equipment Utilized During Treatment: Gait belt Activity Tolerance: Patient tolerated treatment well Patient left: in bed;with call bell/phone within reach;with family/visitor present;with nursing/sitter in room Nurse Communication: Mobility status      Time: 5883-2549 PT Time Calculation (min) (ACUTE ONLY): 18 min   Charges:   PT Evaluation $PT Eval Moderate Complexity: [place], PT, DPT Acute Rehabilitation Services  Pager [phone] Office [phone]  Derry Lory 12/06/2018, 3:54 PM

## 2018-12-06 NOTE — Discharge Instructions (Signed)
Wound Care Keep incision covered and dry,  If you shower, cover incision with plastic wrap.  Do not put any creams, lotions, or ointments on incision.. Activity AS TOLERATED Diet Resume your normal diet.  Return to Work Will be discussed at you follow up appointment. Call Your Doctor If Any of These Occur Redness, drainage, or swelling at the wound.  Temperature greater than 101 degrees. Severe pain not relieved by pain medication. Incision starts to come apart. Follow Up Appt Call for  [phone]) or for problems.

## 2018-12-06 NOTE — Transfer of Care (Signed)
Immediate Anesthesia Transfer of Care Note  Patient: Charles Marquez  Procedure(s) Performed: Shunt Revision - left (Left )  Patient Location: PACU  Anesthesia Type:General  Level of Consciousness: awake  Airway & Oxygen Therapy: Patient Spontanous Breathing and Patient connected to nasal cannula oxygen  Post-op Assessment: Report given to RN, Post -op Vital signs reviewed and stable and Patient moving all extremities  Post vital signs: Reviewed and stable  Last Vitals:  Vitals Value Taken Time  BP 135/109 12/06/2018 11:22 AM  Temp    Pulse 65 12/06/2018 11:25 AM  Resp 13 12/06/2018 11:25 AM  SpO2 100 % 12/06/2018 11:25 AM  Vitals shown include unvalidated device data.  Last Pain:  Vitals:   12/06/18 0801  TempSrc: Oral  PainSc:          Complications: No apparent anesthesia complications

## 2018-12-06 NOTE — Brief Op Note (Signed)
12/06/2018  11:05 AM  PATIENT:  Celene Squibb  63 y.o. male  PRE-OPERATIVE DIAGNOSIS:  Communicating hydrocephalus  POST-OPERATIVE DIAGNOSIS:  Communicating hydrocephalus  PROCEDURE:  Procedure(s) with comments: Shunt Revision - left (Left) - Shunt Revision - left  SURGEON:  Surgeon(s) and Role:    Julio Sicks, MD - Primary  PHYSICIAN ASSISTANT:   ASSISTANTSDoran Durand, NP   ANESTHESIA:   general  EBL:  1 mL   BLOOD ADMINISTERED:none  DRAINS: none   LOCAL MEDICATIONS USED:  NONE  SPECIMEN:  No Specimen  DISPOSITION OF SPECIMEN:  N/A  COUNTS:  YES  TOURNIQUET:  * No tourniquets in log *  DICTATION: .Dragon Dictation  PLAN OF CARE: Admit for overnight observation  PATIENT DISPOSITION:  PACU - hemodynamically stable.   Delay start of Pharmacological VTE agent (>24hrs) due to surgical blood loss or risk of bleeding: yes

## 2018-12-06 NOTE — Op Note (Signed)
Date of procedure: 12/06/2018  Date of dictation: Same  Service: Neurosurgery  Preoperative diagnosis: Left occipital VP shunt malfunction  Postoperative diagnosis: Same  Procedure Name: Revision proximal left VP shunt  Surgeon:Tamyra Fojtik A.Shelia Kingsberry, M.D.  Asst. Surgeon: Doran DurandBergman, NP  Anesthesia: General  Indication: 63 year old male status post complicated shunt history with signs and symptoms of left occipital VP shunt malfunction.  Patient presents now for reexploration of his shunt with revision as necessary.  Operative note: After induction of anesthesia, patient position supine with head turned towards the right.  Patient's left occipital region prepped and draped sterilely.  Wound reopened.  Proximal aspect of the shunt dissected free.  The snap connector was disassembled.  There was sluggish flow from the ventricular catheter.  This was then withdrawn and the 11 cm catheter was changed out to a 9 cm catheter with much improved flow through the catheter.  This was attached to the valve system using the snap connector.  The shunt pumps and refills briskly without any evidence of obstruction.  The wound is then irrigated fanlike solution.  Is then closed in layers.  Sterile dressing was applied.  No apparent complications.  Patient tolerated the procedure well.  He returned to the recovery room postop.

## 2018-12-09 ENCOUNTER — Inpatient Hospital Stay (HOSPITAL_COMMUNITY)
Admission: RE | Admit: 2018-12-09 | Discharge: 2018-12-10 | DRG: 033 | Disposition: A | Discharge status: Home / Self Care | Payer: BLUE CROSS/BLUE SHIELD | Source: Ambulatory Visit | Attending: Neurosurgery | Admitting: Neurosurgery

## 2018-12-09 ENCOUNTER — Encounter (HOSPITAL_COMMUNITY): Payer: Self-pay | Admitting: Registered Nurse

## 2018-12-09 ENCOUNTER — Inpatient Hospital Stay (HOSPITAL_COMMUNITY): Payer: BLUE CROSS/BLUE SHIELD | Admitting: Registered Nurse

## 2018-12-09 ENCOUNTER — Other Ambulatory Visit: Payer: Self-pay | Admitting: Neurosurgery

## 2018-12-09 ENCOUNTER — Ambulatory Visit
Admission: RE | Admit: 2018-12-09 | Discharge: 2018-12-09 | Disposition: A | Discharge status: Home / Self Care | Payer: BLUE CROSS/BLUE SHIELD | Source: Ambulatory Visit | Attending: Neurosurgery | Admitting: Neurosurgery

## 2018-12-09 ENCOUNTER — Ambulatory Visit: Payer: BLUE CROSS/BLUE SHIELD | Admitting: Speech Pathology

## 2018-12-09 ENCOUNTER — Ambulatory Visit (HOSPITAL_COMMUNITY): Payer: BLUE CROSS/BLUE SHIELD

## 2018-12-09 ENCOUNTER — Encounter (HOSPITAL_COMMUNITY)
Admission: RE | Disposition: A | Discharge status: Home / Self Care | Payer: Self-pay | Source: Ambulatory Visit | Attending: Neurosurgery

## 2018-12-09 DIAGNOSIS — E7800 Pure hypercholesterolemia, unspecified: Secondary | ICD-10-CM | POA: Diagnosis present

## 2018-12-09 DIAGNOSIS — R270 Ataxia, unspecified: Secondary | ICD-10-CM | POA: Diagnosis present

## 2018-12-09 DIAGNOSIS — Z79899 Other long term (current) drug therapy: Secondary | ICD-10-CM

## 2018-12-09 DIAGNOSIS — Z7902 Long term (current) use of antithrombotics/antiplatelets: Secondary | ICD-10-CM

## 2018-12-09 DIAGNOSIS — Y752 Prosthetic and other implants, materials and neurological devices associated with adverse incidents: Secondary | ICD-10-CM | POA: Diagnosis present

## 2018-12-09 DIAGNOSIS — Z8673 Personal history of transient ischemic attack (TIA), and cerebral infarction without residual deficits: Secondary | ICD-10-CM

## 2018-12-09 DIAGNOSIS — T8501XA Breakdown (mechanical) of ventricular intracranial (communicating) shunt, initial encounter: Principal | ICD-10-CM | POA: Diagnosis present

## 2018-12-09 DIAGNOSIS — G910 Communicating hydrocephalus: Secondary | ICD-10-CM

## 2018-12-09 DIAGNOSIS — Z982 Presence of cerebrospinal fluid drainage device: Secondary | ICD-10-CM

## 2018-12-09 DIAGNOSIS — T85618A Breakdown (mechanical) of other specified internal prosthetic devices, implants and grafts, initial encounter: Secondary | ICD-10-CM | POA: Diagnosis present

## 2018-12-09 HISTORY — PX: SHUNT REVISION: SHX343

## 2018-12-09 LAB — BASIC METABOLIC PANEL
Anion gap: 8 (ref 5–15)
BUN: 17 mg/dL (ref 6–20)
CO2: 26 mmol/L (ref 22–32)
Calcium: 8.9 mg/dL (ref 8.9–10.3)
Chloride: 103 mmol/L (ref 98–111)
Creatinine, Ser: 0.84 mg/dL (ref 0.61–1.24)
GFR calc Af Amer: >60 mL/min (ref >60)
Glucose, Bld: 101 mg/dL — ABNORMAL HIGH (ref 70–99)
Potassium: 4 mmol/L (ref 3.5–5.1)
Sodium: 137 mmol/L (ref 135–145)

## 2018-12-09 SURGERY — SHUNT REVISION
Anesthesia: General | Site: Head

## 2018-12-09 MED ORDER — CEFAZOLIN SODIUM-DEXTROSE 1-4 GM/50ML-% IV SOLN
1.0000 g | Freq: Three times a day (TID) | INTRAVENOUS | Status: AC
  Administered 2018-12-09 – 2018-12-10 (×2): 1 g via INTRAVENOUS
  Filled 2018-12-09 (×2): qty 50

## 2018-12-09 MED ORDER — HYDROCODONE-ACETAMINOPHEN 10-325 MG PO TABS: 2.0000 | ORAL_TABLET | ORAL | Status: DC | PRN

## 2018-12-09 MED ORDER — SODIUM CHLORIDE 0.9 % IV SOLN
250.0000 mL | INTRAVENOUS | Status: DC
  Administered 2018-12-09: 250 mL via INTRAVENOUS

## 2018-12-09 MED ORDER — TRAMADOL HCL 50 MG PO TABS: 50.0000 mg | ORAL_TABLET | Freq: Every evening | ORAL | Status: DC | PRN

## 2018-12-09 MED ORDER — MENTHOL 3 MG MT LOZG: 1.0000 | LOZENGE | OROMUCOSAL | Status: DC | PRN

## 2018-12-09 MED ORDER — ACETAMINOPHEN 325 MG PO TABS: 325.0000 mg | ORAL_TABLET | ORAL | Status: DC | PRN

## 2018-12-09 MED ORDER — OXYCODONE HCL 5 MG PO TABS: 5.0000 mg | ORAL_TABLET | Freq: Once | ORAL | Status: DC | PRN

## 2018-12-09 MED ORDER — PROPOFOL 10 MG/ML IV BOLUS
INTRAVENOUS | Status: AC
  Filled 2018-12-09: qty 20

## 2018-12-09 MED ORDER — FENTANYL CITRATE (PF) 250 MCG/5ML IJ SOLN
INTRAMUSCULAR | Status: DC | PRN
  Administered 2018-12-09 (×4): 50 ug via INTRAVENOUS
  Administered 2018-12-09 (×2): 25 ug via INTRAVENOUS

## 2018-12-09 MED ORDER — THROMBIN 5000 UNITS EX SOLR
CUTANEOUS | Status: DC | PRN
  Administered 2018-12-09 (×2): 5000 [IU] via TOPICAL

## 2018-12-09 MED ORDER — HYDROMORPHONE HCL 1 MG/ML IJ SOLN: 1.0000 mg | INTRAMUSCULAR | Status: DC | PRN

## 2018-12-09 MED ORDER — THROMBIN 5000 UNITS EX SOLR
CUTANEOUS | Status: AC
  Filled 2018-12-09: qty 5000

## 2018-12-09 MED ORDER — FENTANYL CITRATE (PF) 250 MCG/5ML IJ SOLN
INTRAMUSCULAR | Status: AC
  Filled 2018-12-09: qty 5

## 2018-12-09 MED ORDER — HYDROCODONE-ACETAMINOPHEN 5-325 MG PO TABS
1.0000 | ORAL_TABLET | ORAL | Status: DC | PRN
  Administered 2018-12-09: 1 via ORAL
  Filled 2018-12-09: qty 1

## 2018-12-09 MED ORDER — ONDANSETRON HCL 4 MG/2ML IJ SOLN: 4.0000 mg | Freq: Once | INTRAMUSCULAR | Status: DC | PRN

## 2018-12-09 MED ORDER — EPHEDRINE SULFATE-NACL 50-0.9 MG/10ML-% IV SOSY
PREFILLED_SYRINGE | INTRAVENOUS | Status: DC | PRN
  Administered 2018-12-09: 5 mg via INTRAVENOUS

## 2018-12-09 MED ORDER — CEFAZOLIN SODIUM-DEXTROSE 2-3 GM-%(50ML) IV SOLR
INTRAVENOUS | Status: DC | PRN
  Administered 2018-12-09: 2 g via INTRAVENOUS

## 2018-12-09 MED ORDER — ONDANSETRON HCL 4 MG/2ML IJ SOLN
INTRAMUSCULAR | Status: DC | PRN
  Administered 2018-12-09: 4 mg via INTRAVENOUS

## 2018-12-09 MED ORDER — ACETAMINOPHEN 160 MG/5ML PO SOLN: 325.0000 mg | ORAL | Status: DC | PRN

## 2018-12-09 MED ORDER — 0.9 % SODIUM CHLORIDE (POUR BTL) OPTIME
TOPICAL | Status: DC | PRN
  Administered 2018-12-09: 1000 mL

## 2018-12-09 MED ORDER — CEFAZOLIN SODIUM 1 G IJ SOLR
INTRAMUSCULAR | Status: AC
  Filled 2018-12-09: qty 40

## 2018-12-09 MED ORDER — DEXAMETHASONE SODIUM PHOSPHATE 10 MG/ML IJ SOLN
INTRAMUSCULAR | Status: AC
  Filled 2018-12-09: qty 1

## 2018-12-09 MED ORDER — OCUVITE-LUTEIN PO CAPS
1.0000 | ORAL_CAPSULE | Freq: Every day | ORAL | Status: DC
  Filled 2018-12-09: qty 1

## 2018-12-09 MED ORDER — ATORVASTATIN CALCIUM 40 MG PO TABS
40.0000 mg | ORAL_TABLET | Freq: Every day | ORAL | Status: DC
  Administered 2018-12-09: 40 mg via ORAL
  Filled 2018-12-09: qty 1

## 2018-12-09 MED ORDER — ACETAMINOPHEN 650 MG RE SUPP: 650.0000 mg | RECTAL | Status: DC | PRN

## 2018-12-09 MED ORDER — ONDANSETRON HCL 4 MG/2ML IJ SOLN
INTRAMUSCULAR | Status: AC
  Filled 2018-12-09: qty 2

## 2018-12-09 MED ORDER — MEPERIDINE HCL 50 MG/ML IJ SOLN: 6.2500 mg | INTRAMUSCULAR | Status: DC | PRN

## 2018-12-09 MED ORDER — KETOROLAC TROMETHAMINE 15 MG/ML IJ SOLN
30.0000 mg | Freq: Four times a day (QID) | INTRAMUSCULAR | Status: DC
  Administered 2018-12-09 – 2018-12-10 (×3): 30 mg via INTRAVENOUS
  Filled 2018-12-09 (×5): qty 2

## 2018-12-09 MED ORDER — LIDOCAINE 2% (20 MG/ML) 5 ML SYRINGE
INTRAMUSCULAR | Status: DC | PRN
  Administered 2018-12-09: 80 mg via INTRAVENOUS

## 2018-12-09 MED ORDER — SODIUM CHLORIDE 0.9 % IV SOLN
INTRAVENOUS | Status: DC | PRN
  Administered 2018-12-09: 14:00:00

## 2018-12-09 MED ORDER — ROCURONIUM BROMIDE 50 MG/5ML IV SOSY
PREFILLED_SYRINGE | INTRAVENOUS | Status: AC
  Filled 2018-12-09: qty 5

## 2018-12-09 MED ORDER — CYCLOBENZAPRINE HCL 10 MG PO TABS: 10.0000 mg | ORAL_TABLET | Freq: Three times a day (TID) | ORAL | Status: DC | PRN

## 2018-12-09 MED ORDER — SODIUM CHLORIDE 0.9% FLUSH: 3.0000 mL | INTRAVENOUS | Status: DC | PRN

## 2018-12-09 MED ORDER — SALINE SPRAY 0.65 % NA SOLN
1.0000 | NASAL | Status: DC | PRN
  Filled 2018-12-09: qty 44

## 2018-12-09 MED ORDER — BACITRACIN ZINC 500 UNIT/GM EX OINT
TOPICAL_OINTMENT | CUTANEOUS | Status: DC | PRN
  Administered 2018-12-09: 1 via TOPICAL

## 2018-12-09 MED ORDER — LACTATED RINGERS IV SOLN
INTRAVENOUS | Status: DC
  Administered 2018-12-09: 13:00:00 via INTRAVENOUS

## 2018-12-09 MED ORDER — SODIUM CHLORIDE 0.9% FLUSH
3.0000 mL | Freq: Two times a day (BID) | INTRAVENOUS | Status: DC
  Administered 2018-12-10 (×2): 3 mL via INTRAVENOUS

## 2018-12-09 MED ORDER — FENTANYL CITRATE (PF) 100 MCG/2ML IJ SOLN: 25.0000 ug | INTRAMUSCULAR | Status: DC | PRN

## 2018-12-09 MED ORDER — BACITRACIN ZINC 500 UNIT/GM EX OINT
TOPICAL_OINTMENT | CUTANEOUS | Status: AC
  Filled 2018-12-09: qty 28.35

## 2018-12-09 MED ORDER — PHENOL 1.4 % MT LIQD: 1.0000 | OROMUCOSAL | Status: DC | PRN

## 2018-12-09 MED ORDER — PHENYLEPHRINE 40 MCG/ML (10ML) SYRINGE FOR IV PUSH (FOR BLOOD PRESSURE SUPPORT)
PREFILLED_SYRINGE | INTRAVENOUS | Status: DC | PRN
  Administered 2018-12-09 (×4): 80 ug via INTRAVENOUS

## 2018-12-09 MED ORDER — SODIUM CHLORIDE 0.9 % IV SOLN
INTRAVENOUS | Status: DC | PRN
  Administered 2018-12-09: 30 ug/min via INTRAVENOUS

## 2018-12-09 MED ORDER — VANCOMYCIN HCL IN DEXTROSE 1-5 GM/200ML-% IV SOLN
1000.0000 mg | INTRAVENOUS | Status: AC
  Administered 2018-12-09: 1000 mg via INTRAVENOUS
  Filled 2018-12-09: qty 200

## 2018-12-09 MED ORDER — DEXAMETHASONE SODIUM PHOSPHATE 10 MG/ML IJ SOLN
INTRAMUSCULAR | Status: DC | PRN
  Administered 2018-12-09: 10 mg via INTRAVENOUS

## 2018-12-09 MED ORDER — SUGAMMADEX SODIUM 200 MG/2ML IV SOLN
INTRAVENOUS | Status: DC | PRN
  Administered 2018-12-09: 200 mg via INTRAVENOUS

## 2018-12-09 MED ORDER — PROPOFOL 10 MG/ML IV BOLUS
INTRAVENOUS | Status: DC | PRN
  Administered 2018-12-09: 160 mg via INTRAVENOUS

## 2018-12-09 MED ORDER — ONDANSETRON HCL 4 MG/2ML IJ SOLN: 4.0000 mg | Freq: Four times a day (QID) | INTRAMUSCULAR | Status: DC | PRN

## 2018-12-09 MED ORDER — LACTATED RINGERS IV SOLN
INTRAVENOUS | Status: DC | PRN
  Administered 2018-12-09: 15:00:00 via INTRAVENOUS

## 2018-12-09 MED ORDER — LIDOCAINE-EPINEPHRINE 1 %-1:100000 IJ SOLN
INTRAMUSCULAR | Status: AC
  Filled 2018-12-09: qty 1

## 2018-12-09 MED ORDER — HYPROMELLOSE (GONIOSCOPIC) 2.5 % OP SOLN
1.0000 [drp] | OPHTHALMIC | Status: DC | PRN
  Filled 2018-12-09: qty 15

## 2018-12-09 MED ORDER — ONDANSETRON HCL 4 MG PO TABS: 4.0000 mg | ORAL_TABLET | Freq: Four times a day (QID) | ORAL | Status: DC | PRN

## 2018-12-09 MED ORDER — LIDOCAINE 2% (20 MG/ML) 5 ML SYRINGE
INTRAMUSCULAR | Status: AC
  Filled 2018-12-09: qty 5

## 2018-12-09 MED ORDER — OXYCODONE HCL 5 MG/5ML PO SOLN: 5.0000 mg | Freq: Once | ORAL | Status: DC | PRN

## 2018-12-09 MED ORDER — ROCURONIUM BROMIDE 10 MG/ML (PF) SYRINGE
PREFILLED_SYRINGE | INTRAVENOUS | Status: DC | PRN
  Administered 2018-12-09: 10 mg via INTRAVENOUS
  Administered 2018-12-09: 50 mg via INTRAVENOUS
  Administered 2018-12-09: 10 mg via INTRAVENOUS

## 2018-12-09 MED ORDER — ACETAMINOPHEN 325 MG PO TABS: 650.0000 mg | ORAL_TABLET | ORAL | Status: DC | PRN

## 2018-12-09 MED ORDER — HEMOSTATIC AGENTS (NO CHARGE) OPTIME
TOPICAL | Status: DC | PRN
  Administered 2018-12-09: 1 via TOPICAL

## 2018-12-09 SURGICAL SUPPLY — 74 items
BAG DECANTER FOR FLEXI CONT (MISCELLANEOUS) ×3 IMPLANT
BANDAGE ADH SHEER 1  50/CT (GAUZE/BANDAGES/DRESSINGS) IMPLANT
BENZOIN TINCTURE PRP APPL 2/3 (GAUZE/BANDAGES/DRESSINGS) ×6 IMPLANT
BLADE SURG 11 STRL SS (BLADE) ×3 IMPLANT
BNDG GAUZE ELAST 4 BULKY (GAUZE/BANDAGES/DRESSINGS) IMPLANT
BUR ACORN 6.0 PRECISION (BURR) IMPLANT
BUR ACORN 6.0MM PRECISION (BURR)
CANISTER SUCT 3000ML PPV (MISCELLANEOUS) ×3 IMPLANT
CARTRIDGE OIL MAESTRO DRILL (MISCELLANEOUS) IMPLANT
CATH VENTRICULAR 9CM (Shunt) ×3 IMPLANT
CLIP RANEY DISP (INSTRUMENTS) IMPLANT
CLOSURE WOUND 1/2 X4 (GAUZE/BANDAGES/DRESSINGS) ×1
COVER WAND RF STERILE (DRAPES) IMPLANT
DERMABOND ADVANCED (GAUZE/BANDAGES/DRESSINGS) ×2
DERMABOND ADVANCED .7 DNX12 (GAUZE/BANDAGES/DRESSINGS) ×1 IMPLANT
DIFFUSER DRILL AIR PNEUMATIC (MISCELLANEOUS) IMPLANT
DRAPE INCISE IOBAN 85X60 (DRAPES) ×3 IMPLANT
DRAPE ORTHO SPLIT 77X108 STRL (DRAPES) ×4
DRAPE SURG 17X23 STRL (DRAPES) IMPLANT
DRAPE SURG ORHT 6 SPLT 77X108 (DRAPES) ×2 IMPLANT
DRSG OPSITE 4X5.5 SM (GAUZE/BANDAGES/DRESSINGS) IMPLANT
DRSG OPSITE POSTOP 3X4 (GAUZE/BANDAGES/DRESSINGS) ×6 IMPLANT
ELECT REM PT RETURN 9FT ADLT (ELECTROSURGICAL) ×3
ELECTRODE REM PT RTRN 9FT ADLT (ELECTROSURGICAL) ×1 IMPLANT
GAUZE SPONGE 4X4 12PLY STRL (GAUZE/BANDAGES/DRESSINGS) IMPLANT
GLOVE BIOGEL PI IND STRL 6.5 (GLOVE) ×1 IMPLANT
GLOVE BIOGEL PI IND STRL 7.0 (GLOVE) ×1 IMPLANT
GLOVE BIOGEL PI INDICATOR 6.5 (GLOVE) ×2
GLOVE BIOGEL PI INDICATOR 7.0 (GLOVE) ×2
GLOVE ECLIPSE 9.0 STRL (GLOVE) ×6 IMPLANT
GLOVE EXAM NITRILE XL STR (GLOVE) IMPLANT
GLOVE SURG SS PI 6.0 STRL IVOR (GLOVE) ×3 IMPLANT
GLOVE SURG SS PI 7.0 STRL IVOR (GLOVE) ×3 IMPLANT
GOWN STRL REUS W/ TWL LRG LVL3 (GOWN DISPOSABLE) ×3 IMPLANT
GOWN STRL REUS W/ TWL XL LVL3 (GOWN DISPOSABLE) ×1 IMPLANT
GOWN STRL REUS W/TWL 2XL LVL3 (GOWN DISPOSABLE) IMPLANT
GOWN STRL REUS W/TWL LRG LVL3 (GOWN DISPOSABLE) ×6
GOWN STRL REUS W/TWL XL LVL3 (GOWN DISPOSABLE) ×2
HEMOSTAT SURGICEL 2X14 (HEMOSTASIS) IMPLANT
KIT BASIN OR (CUSTOM PROCEDURE TRAY) ×3 IMPLANT
KIT SUTURE REMOVAL HAMOT (SET/KITS/TRAYS/PACK) ×3 IMPLANT
KIT TURNOVER KIT B (KITS) ×3 IMPLANT
MARKER SKIN DUAL TIP RULER LAB (MISCELLANEOUS) ×3 IMPLANT
NS IRRIG 1000ML POUR BTL (IV SOLUTION) ×3 IMPLANT
OIL CARTRIDGE MAESTRO DRILL (MISCELLANEOUS)
PACK LAMINECTOMY NEURO (CUSTOM PROCEDURE TRAY) ×3 IMPLANT
PAD ARMBOARD 7.5X6 YLW CONV (MISCELLANEOUS) ×6 IMPLANT
PATTIES SURGICAL .5 X3 (DISPOSABLE) IMPLANT
RUBBERBAND STERILE (MISCELLANEOUS) IMPLANT
SHEATH PERITONEAL INTRO 46 (MISCELLANEOUS) IMPLANT
SHEATH PERITONEAL INTRO 61 (MISCELLANEOUS) IMPLANT
SHUNT STRATA 11 SNAP REG (Shunt) ×3 IMPLANT
SPONGE INTESTINAL PEANUT (DISPOSABLE) IMPLANT
SPONGE LAP 4X18 RFD (DISPOSABLE) IMPLANT
SPONGE SURGIFOAM ABS GEL SZ50 (HEMOSTASIS) ×3 IMPLANT
STAPLER VISISTAT 35W (STAPLE) ×3 IMPLANT
STRIP CLOSURE SKIN 1/2X4 (GAUZE/BANDAGES/DRESSINGS) ×2 IMPLANT
SUT CHROMIC 3 0 SH 27 (SUTURE) IMPLANT
SUT ETHILON 3 0 FSL (SUTURE) IMPLANT
SUT ETHILON 4 0 PS 2 18 (SUTURE) IMPLANT
SUT NURALON 4 0 TR CR/8 (SUTURE) IMPLANT
SUT SILK 0 TIES 10X30 (SUTURE) IMPLANT
SUT SILK 2 0 TIES 17X18 (SUTURE) ×2
SUT SILK 2-0 18XBRD TIE BLK (SUTURE) ×1 IMPLANT
SUT SILK 3 0 SH 30 (SUTURE) IMPLANT
SUT VIC AB 2-0 CT2 18 VCP726D (SUTURE) ×6 IMPLANT
SUT VIC AB 3-0 SH 8-18 (SUTURE) ×3 IMPLANT
SUT VICRYL 4-0 PS2 18IN ABS (SUTURE) IMPLANT
SYR 5ML LL (SYRINGE) IMPLANT
TOWEL GREEN STERILE (TOWEL DISPOSABLE) ×3 IMPLANT
TOWEL GREEN STERILE FF (TOWEL DISPOSABLE) ×3 IMPLANT
TRAY FOLEY MTR SLVR 16FR STAT (SET/KITS/TRAYS/PACK) IMPLANT
UNDERPAD 30X30 (UNDERPADS AND DIAPERS) IMPLANT
WATER STERILE IRR 1000ML POUR (IV SOLUTION) ×3 IMPLANT

## 2018-12-09 NOTE — Progress Notes (Signed)
Patient arrived to the floor alert and orient to self and placed only. Disoriented to time and situation. Denies pain Surgical Incision left upper abdomen and left side of the head. Both incision have honeycomb dressings clean dry and intact. All questions and concerns addressed. Nurse will continue to monitor.

## 2018-12-09 NOTE — Transfer of Care (Signed)
Immediate Anesthesia Transfer of Care Note  Patient: Charles Marquez  Procedure(s) Performed: SHUNT REVISION (N/A Head)  Patient Location: PACU  Anesthesia Type:General  Level of Consciousness: drowsy and patient cooperative  Airway & Oxygen Therapy: Patient Spontanous Breathing and Patient connected to face mask oxygen  Post-op Assessment: Report given to RN and Post -op Vital signs reviewed and stable  Post vital signs: Reviewed and stable  Last Vitals:  Vitals Value Taken Time  BP 143/93 12/09/2018  4:14 PM  Temp    Pulse 59 12/09/2018  4:21 PM  Resp 18 12/09/2018  4:21 PM  SpO2 100 % 12/09/2018  4:21 PM  Vitals shown include unvalidated device data.  Last Pain:  Vitals:   12/09/18 1230  TempSrc:   PainSc: 0-No pain      Patients Stated Pain Goal: 3 (12/09/18 1230)  Complications: No apparent anesthesia complications

## 2018-12-09 NOTE — Brief Op Note (Signed)
12/09/2018  3:58 PM  PATIENT:  Charles Marquez  63 y.o. male  PRE-OPERATIVE DIAGNOSIS:  Hydrocephalus  POST-OPERATIVE DIAGNOSIS:  Hydrocephalus  PROCEDURE:  Procedure(s): SHUNT REVISION (N/A)  SURGEON:  Surgeon(s) and Role:    * Julio Sicks, MD - Primary  PHYSICIAN ASSISTANT:   ASSISTANTSMarland Mcalpine   ANESTHESIA:   general  EBL:  10 mL   BLOOD ADMINISTERED:none  DRAINS: none   LOCAL MEDICATIONS USED:  MARCAINE     SPECIMEN:  No Specimen  DISPOSITION OF SPECIMEN:  N/A  COUNTS:  YES  TOURNIQUET:  * No tourniquets in log *  DICTATION: .Dragon Dictation  PLAN OF CARE: Admit to inpatient   PATIENT DISPOSITION:  PACU - hemodynamically stable.   Delay start of Pharmacological VTE agent (>24hrs) due to surgical blood loss or risk of bleeding: yes

## 2018-12-09 NOTE — Anesthesia Preprocedure Evaluation (Signed)
Anesthesia Evaluation  Patient identified by MRN, date of birth, ID band Patient confused    Reviewed: Allergy & Precautions, H&P , NPO status , Patient's Chart, lab work & pertinent test results  Airway Mallampati: II  TM Distance: >3 FB Neck ROM: Full    Dental no notable dental hx. (+) Teeth Intact, Dental Advisory Given   Pulmonary neg pulmonary ROS,    Pulmonary exam normal breath sounds clear to auscultation       Cardiovascular hypertension, Pt. on medications  Rhythm:Regular Rate:Normal     Neuro/Psych Anxiety Hydrocephalus TIACVA, No Residual Symptoms    GI/Hepatic negative GI ROS, Neg liver ROS,   Endo/Other  negative endocrine ROS  Renal/GU negative Renal ROS  negative genitourinary   Musculoskeletal  (+) Arthritis , Osteoarthritis,    Abdominal   Peds negative pediatric ROS (+)  Hematology  (+) Blood dyscrasia, anemia ,   Anesthesia Other Findings   Reproductive/Obstetrics negative OB ROS                             Lab Results  Component Value Date   WBC 7.2 12/06/2018   HGB 12.0 (L) 12/06/2018   HCT 38.6 (L) 12/06/2018   MCV 89.1 12/06/2018   PLT 225 12/06/2018   Lab Results  Component Value Date   CREATININE 0.84 12/09/2018   BUN 17 12/09/2018   NA 137 12/09/2018   K 4.0 12/09/2018   CL 103 12/09/2018   CO2 26 12/09/2018    Anesthesia Physical  Anesthesia Plan  ASA: III and emergent  Anesthesia Plan: General   Post-op Pain Management:    Induction: Intravenous  PONV Risk Score and Plan: 3 and Ondansetron, Dexamethasone and Treatment may vary due to age or medical condition  Airway Management Planned: Oral ETT and LMA  Additional Equipment:   Intra-op Plan:   Post-operative Plan: Extubation in OR and Possible Post-op intubation/ventilation  Informed Consent: I have reviewed the patients History and Physical, chart, labs and discussed the  procedure including the risks, benefits and alternatives for the proposed anesthesia with the patient or authorized representative who has indicated his/her understanding and acceptance.     Dental advisory given  Plan Discussed with: CRNA, Anesthesiologist and Surgeon  Anesthesia Plan Comments:         Anesthesia Quick Evaluation

## 2018-12-09 NOTE — H&P (Signed)
Charles Marquez is an 63 y.o. male.   Chief Complaint: Shunt malfunction HPI: 63 year old male status post recent left occipital VP shunt revision presents now with increasing ataxia and decreasing cognition.  Head CT scan done as an outpatient demonstrates increasing lateral ventricular size consistent with continued VP shunt malfunction.  Patient presents now for VP shunt revision.  Past Medical History:  Diagnosis Date  . Anxiety   . Hypercholesteremia   . Stroke (HCC)    tia's  . TIA (transient ischemic attack)    09.15    Past Surgical History:  Procedure Laterality Date  . Fractured arm Left 12  . HERNIA REPAIR Right 3/12  . LAPAROSCOPIC REVISION VENTRICULAR-PERITONEAL (V-P) SHUNT N/A 08/26/2018   Procedure: LAPAROSCOPIC INSERTION VENTRICULAR-PERITONEAL (V-P) SHUNT;  Surgeon: Julio SicksPool, Jala Dundon, MD;  Location: [facility] OR;  Service: Neurosurgery;  Laterality: N/A;  . LOOP RECORDER INSERTION N/A 04/10/2017   Procedure: Loop Recorder Insertion;  Surgeon: Hillis RangeAllred, James, MD;  Location: [facility] INVASIVE CV LAB;  Service: Cardiovascular;  Laterality: N/A;  . SHUNT REMOVAL Right 03/13/2016   Procedure: SHUNT REMOVAL;  Surgeon: Julio SicksHenry Nashawn Hillock, MD;  Location: [facility] NEURO ORS;  Service: Neurosurgery;  Laterality: Right;  . SHUNT REMOVAL Right 08/09/2018   Procedure: SHUNT REMOVAL With Placement of Ventricular Catheter;  Surgeon: Lisbeth RenshawNundkumar, Neelesh, MD;  [facility];  Service: Neurosurgery;  Laterality: Right;  . SHUNT REVISION Right 08/05/2018   Procedure: SHUNT REVISION;  Surgeon: Julio SicksPool, Kyrianna Barletta, MD;  [facility];  Service: Neurosurgery;  Laterality: Right;  . SHUNT REVISION VENTRICULAR-PERITONEAL Left 08/26/2018   Procedure: SHUNT REVISION VENTRICULAR-PERITONEAL;  Surgeon: Julio SicksPool, Keean Wilmeth, MD;  [facility];  Service: Neurosurgery;  Laterality: Left;  . SHUNT REVISION VENTRICULAR-PERITONEAL Left 09/02/2018   Procedure: Left Occipital VP shunt revision;  Surgeon: Julio SicksPool, Deyja Sochacki, MD;  [facility];  Service:  Neurosurgery;  Laterality: Left;  [name]. VASECTOMY  10/02/1997  . VENTRICULOPERITONEAL SHUNT Right 12/18/2014   Procedure: Shunt Placment - right occipital VP shunt ;  Surgeon: Temple PaciniHenry A Deren Degrazia, MD;  Location: [facility] NEURO ORS;  Service: Neurosurgery;  Laterality: Right;  Shunt Placment - right occipital VP shunt   . VENTRICULOPERITONEAL SHUNT Right 07/22/2018   Procedure: Shunt Placment right occipital;  Surgeon: Julio SicksPool, Adylin Hankey, MD;  [facility];  Service: Neurosurgery;  Laterality: Right;  . VENTRICULOPERITONEAL SHUNT Left 08/26/2018   Procedure: LEFT SIDED VENTRICULAR-PERITONEAL SHUNT;  Surgeon: Julio SicksPool, Yosgart Pavey, MD;  [facility];  Service: Neurosurgery;  Laterality: Left;  [name]. VENTRICULOSTOMY Right 08/09/2018   Procedure: VENTRICULOSTOMY;  Surgeon: Lisbeth RenshawNundkumar, Neelesh, MD;  [facility];  Service: Neurosurgery;  Laterality: Right;    Family History  Problem Relation Age of Onset  . COPD Mother   . Lung cancer Father   . Alzheimer's disease Father    Social History:  reports that he has never smoked. He has never used smokeless tobacco. He reports current alcohol use. He reports that he does not use drugs.  Allergies: No Known Allergies  Medications Prior to Admission  Medication Sig Dispense Refill  . atorvastatin (LIPITOR) 40 MG tablet Take 1 tablet (40 mg total) by mouth daily. 90 tablet 3  . Cetirizine HCl (ZYRTEC PO) Take 1 tablet by mouth daily as needed (seasonal allergies).     . clopidogrel (PLAVIX) 75 MG tablet Take 75 mg by mouth daily.    . hydroxypropyl methylcellulose / hypromellose (ISOPTO TEARS / GONIOVISC) 2.5 % ophthalmic solution Place 1 drop into both eyes as needed for dry eyes.    .[name]  Multiple Vitamins-Minerals (OCUVITE EYE HEALTH FORMULA) CAPS Take 1 capsule by mouth daily.    . sodium chloride (OCEAN) 0.65 % SOLN nasal spray Place 1 spray into both nostrils as needed for congestion.    . traMADol (ULTRAM) 50 MG tablet Take 1 tablet (50 mg total) by mouth at bedtime as needed  (pain). 30 tablet 0  . acetaminophen (TYLENOL) 325 MG tablet Take 2 tablets (650 mg total) by mouth every 4 (four) hours as needed for mild pain (temp > 100.5).    . famotidine (PEPCID) 20 MG tablet Take 1 tablet (20 mg total) by mouth 2 (two) times daily. (Patient not taking: Reported on 11/05/2018) 60 tablet 0    Results for orders placed or performed during the hospital encounter of 12/09/18 (from the past 48 hour(s))  Basic metabolic panel     Status: Abnormal   Collection Time: 12/09/18 12:32 PM  Result Value Ref Range   Sodium 137 135 - 145 mmol/L   Potassium 4.0 3.5 - 5.1 mmol/L   Chloride 103 98 - 111 mmol/L   CO2 26 22 - 32 mmol/L   Glucose, Bld 101 (H) 70 - 99 mg/dL   BUN 17 6 - 20 mg/dL   Creatinine, Ser 1.610.84 0.61 - 1.24 mg/dL   Calcium 8.9 8.9 - 09.610.3 mg/dL   GFR calc non Af Amer >60 >60 mL/min   GFR calc Af Amer >60 >60 mL/min   Anion gap 8 5 - 15    Comment: Performed at [facility], [address] [place], [place] [id number]   Ct Head Wo Contrast  Result Date: 12/09/2018 CLINICAL DATA:  Shunt revision 12/06/2018, assess improvement of shunt function EXAM: CT HEAD WITHOUT CONTRAST TECHNIQUE: Contiguous axial images were obtained from the base of the skull through the vertex without intravenous contrast. COMPARISON:  CT brain, 12/05/1999 FINDINGS: Brain: There has been interval revision of a left parietal approach intraventricular shunt catheter, tip positioned in the body of the right lateral ventricle. The lateral ventricles and third ventricle remain distended, with similar caliber and configuration compared to prior examination dated 12/04/2018, maximum caliber of the left temporal horn approximately 2.1 cm. There is a small focus of air within anterior horn of the left lateral ventricle, likely introduced during shunt revision. Unchanged extensive periventricular white matter hypodensity, consistent with periventricular edema and/or underlying white matter disease.  Unchanged subcortical hypodensity of the left frontal lobe (series 2, image 18), which remains concerning for developing subacute infarction. Unchanged tract encephalomalacia along prior right frontal and right parietal shunt catheter approaches. Vascular: No hyperdense vessel or unexpected calcification. Skull: Left parietal approach shunt catheter. Abandoned right frontal and right parietal shunt catheter sites. Negative for fracture or focal lesion. Sinuses/Orbits: No acute finding. Other: None. IMPRESSION: 1. There has been interval revision of a left parietal approach intra-ventricular shunt catheter. The lateral ventricles and third ventricle remain distended, with similar caliber and configuration compared to prior examination dated 12/04/2018. There is a small focus of air within anterior horn of the left lateral ventricle, likely introduced during shunt revision. 2. Unchanged extensive periventricular white matter hypodensity, consistent with periventricular edema and/or underlying white matter disease. 3. Unchanged subcortical hypodensity of the left frontal lobe (series 2, image 18), which remains concerning for developing subacute infarction. Electronically Signed   By: Lauralyn PrimesAlex  Bibbey M.D.   On: 12/09/2018 11:28    Pertinent items noted in HPI and remainder of comprehensive ROS otherwise negative.  Blood pressure (!) 129/91,  pulse (!) 57, temperature 98.2 F (36.8 C), temperature source Oral, resp. rate 18, height 5\' 11"  (1.803 m), weight 88.5 kg, SpO2 97 %.  Patient is awake and alert.  He is oriented to person only.  His speech is sluggish but content is reasonably good.  Short and long-term memory poor.  Moving all extremities but does have evidence of some ataxia.  Shunt wound clean and dry.  Neck supple.  Chest and abdomen benign.   Assessment/Plan Left occipital VP shunt malfunction.  Plan to return to the OR for more extensive shunt revision with possible replacement of valve or maybe even  replacement of the hole VP shunt system.  Risks and benefits of been explained.  Patient wishes to proceed.  Sherilyn Cooter A Geoffery Aultman 12/09/2018, 2:13 PM

## 2018-12-09 NOTE — Anesthesia Procedure Notes (Signed)
Procedure Name: Intubation Date/Time: 12/09/2018 2:59 PM Performed by: Jearld Pies, CRNA Pre-anesthesia Checklist: Patient identified, Emergency Drugs available, Suction available and Patient being monitored Patient Re-evaluated:Patient Re-evaluated prior to induction Oxygen Delivery Method: Circle System Utilized Preoxygenation: Pre-oxygenation with 100% oxygen Induction Type: IV induction Ventilation: Mask ventilation without difficulty and Oral airway inserted - appropriate to patient size Laryngoscope Size: Mac and 4 Grade View: Grade I Tube type: Oral Tube size: 7.5 mm Number of attempts: 1 Airway Equipment and Method: Stylet and Oral airway Placement Confirmation: ETT inserted through vocal cords under direct vision,  positive ETCO2 and breath sounds checked- equal and bilateral Secured at: 23 cm Tube secured with: Tape Dental Injury: Teeth and Oropharynx as per pre-operative assessment

## 2018-12-09 NOTE — Op Note (Signed)
Date of procedure: 12/09/2018  Date of dictation: Same  Service: Neurosurgery  Preoperative diagnosis: Left occipital VP shunt malfunction  Postoperative diagnosis: Same  Procedure Name: Revision left occipital VP shunt with replacement of valve system and peritoneal catheter  Surgeon:Candyce Gambino A.Remmie Bembenek, M.D.  Asst. Surgeon: Reinaldo Meeker, NP  Anesthesia: General  Indication: 63 year old male status post recent revision of his left occipital VP shunt system.  Patient with ongoing worsening neurologic exam.  Work-up demonstrates evidence of continued enlargement of his ventricular system.  Patient presents now for reexploration of the shunt with further revision as necessary.  Operative note: After induction of anesthesia, patient position supine with head turned to the right.  Patient's scalp neck and abdominal region prepped and draped sterilely.  Incision made overlying his left occipital wound.  This carried down sharply to the proximal shunt system.  The proximal snap connector was disassembled and good flow was observed from the ventricular catheter.  A stylet was placed in the ventricular catheter and tension was placed to the distal catheter and valve.  The valve seem to be working reasonably well but the distal runoff was poor.  Attempts at irrigating out the peritoneal catheter were met with resistance.  As a catheter was placed with laparoscopic guidance before I felt comfortable that the position was good.  There is been no evidence of coiling on prior x-rays consistent with pseudocyst patient has had no fevers consistent with infection.  I sided this point that the distal catheter must be plugged in some way shape or form.  I cut down on the peritoneal entry site.  The catheter was dissected free.  I bluntly dissected along the catheter tract and entered the peritoneal cath cavity.  I then used the catheter tied to a suture tied to a new valve system with distal catheter and pulled the distal  catheter into place through the prior shunt tract.  The new valve was set at level 0.5.  It was attached to the proximal catheter and good flow of CSF through the system was observed.  The peritoneal catheter was then passed into the peritoneal cavity with some difficulty but eventually with good passage.  The prior peritoneal catheter was removed.  At this point the wounds were irrigated then closed in typical fashion.  Sterile dressings were applied.  No apparent complications.  Patient tolerated procedure well and he returns to the recovery room postop.

## 2018-12-09 NOTE — Anesthesia Postprocedure Evaluation (Signed)
Anesthesia Post Note  Patient: Charles Marquez  Procedure(s) Performed: SHUNT REVISION (N/A Head)     Patient location during evaluation: PACU Anesthesia Type: General Level of consciousness: awake and alert Pain management: pain level controlled Vital Signs Assessment: post-procedure vital signs reviewed and stable Respiratory status: spontaneous breathing, nonlabored ventilation and respiratory function stable Cardiovascular status: blood pressure returned to baseline and stable Postop Assessment: no apparent nausea or vomiting Anesthetic complications: no    Last Vitals:  Vitals:   12/09/18 1700 12/09/18 1719  BP: (!) 147/82 (!) 159/94  Pulse: 64 (!) 55  Resp: 14   Temp:  (!) 36.4 C  SpO2: 100% 100%    Last Pain:  Vitals:   12/09/18 1719  TempSrc: Oral  PainSc:                  [name]

## 2018-12-10 ENCOUNTER — Other Ambulatory Visit: Payer: Self-pay

## 2018-12-10 ENCOUNTER — Encounter (HOSPITAL_COMMUNITY): Payer: Self-pay | Admitting: Neurosurgery

## 2018-12-10 MED ORDER — PROSIGHT PO TABS
1.0000 | ORAL_TABLET | Freq: Every day | ORAL | Status: DC
  Administered 2018-12-10: 1 via ORAL
  Filled 2018-12-10: qty 1

## 2018-12-10 NOTE — Care Management Note (Signed)
Case Management Note  Patient Details  Name: Som Huda MRN: [id number] Date of Birth: 11-Feb-1956  Subjective/Objective:    Pt s/p shunt revision. He is from home with his spouse that can provide 24 hour supervision.  DME: Shower chair, walker No issues obtaining his meds.  No issues with transportation.                Action/Plan: Pt discharging home with self care. Pt was going to [facility] prior to admission and wants to continue therapy through this location. CM entered orders in Epic and information on the AVS.  Wife to provide transportation home.   Expected Discharge Date:  12/10/18               Expected Discharge Plan:  OP Rehab  In-House Referral:     Discharge planning Services  CM Consult  Post Acute Care Choice:    Choice offered to:     DME Arranged:    DME Agency:     HH Arranged:    HH Agency:     Status of Service:  Completed, signed off  If discussed at [employer] of Stay Meetings, dates discussed:    Additional Comments:  Kermit Balo, RN 12/10/2018, 11:37 AM

## 2018-12-10 NOTE — Discharge Instructions (Signed)

## 2018-12-10 NOTE — Discharge Summary (Signed)
Physician Discharge Summary  Patient ID: Charles Marquez MRN: [id number] DOB/AGE: Oct 22, 1956 63 y.o.  Admit date: 12/09/2018 Discharge date: 12/10/2018  Admission Diagnoses:  Discharge Diagnoses:  Active Problems:   Shunt malfunction   Discharged Condition: good  Hospital Course: Patient admitted to the hospital where he underwent a left-sided VP shunt revision with replacement of his shunt valve and peritoneal catheter.  Postoperatively he is doing much better.  He is having less headache.  He is brighter and his memory and cognition is better.  He is more stable on his feet.  He is ambulating without difficulty.  Both he and his wife feel ready for discharge home.  Patient denies any belly pain.  He has had flatus and a bowel movement today.  Consults:   Significant Diagnostic Studies:   Treatments:   Discharge Exam: Blood pressure 107/79, pulse 61, temperature 98.1 F (36.7 C), temperature source Oral, resp. rate 18, height 5\' 11"  (1.803 m), weight 88.5 kg, SpO2 96 %. Awake and alert.  Oriented and appropriate.  Cranial nerve function intact.  Motor and sensory function of the extremities normal.  Wounds clean and dry.  Abdomen soft.  Chest benign.  Disposition: Discharge disposition: 01-Home or Self Care        Allergies as of 12/10/2018   No Known Allergies     Medication List    TAKE these medications   acetaminophen 325 MG tablet Commonly known as:  TYLENOL Take 2 tablets (650 mg total) by mouth every 4 (four) hours as needed for mild pain (temp > 100.5).   atorvastatin 40 MG tablet Commonly known as:  LIPITOR Take 1 tablet (40 mg total) by mouth daily.   clopidogrel 75 MG tablet Commonly known as:  PLAVIX Take 75 mg by mouth daily.   famotidine 20 MG tablet Commonly known as:  PEPCID Take 1 tablet (20 mg total) by mouth 2 (two) times daily.   hydroxypropyl methylcellulose / hypromellose 2.5 % ophthalmic solution Commonly known as:  ISOPTO TEARS /  GONIOVISC Place 1 drop into both eyes as needed for dry eyes.   OCUVITE EYE HEALTH FORMULA Caps Take 1 capsule by mouth daily.   sodium chloride 0.65 % Soln nasal spray Commonly known as:  OCEAN Place 1 spray into both nostrils as needed for congestion.   traMADol 50 MG tablet Commonly known as:  ULTRAM Take 1 tablet (50 mg total) by mouth at bedtime as needed (pain).   ZYRTEC PO Take 1 tablet by mouth daily as needed (seasonal allergies).        Signed: Kathaleen Maser Vinette Crites 12/10/2018, 11:02 AM

## 2018-12-10 NOTE — Evaluation (Signed)
Physical Therapy Evaluation Patient Details Name: Charles Marquez MRN: [id number] DOB: 07/16/1956 Today's Date: 12/10/2018   History of Present Illness  Pt is a 63 y.o. male s/p shunt revision 12/06/18 who presents with increasing ataxia and decreased mental status after d/c home. 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   Clinical Impression  Pt admitted with above diagnosis. Pt currently with functional limitations due to the deficits listed below (see PT Problem List). Pt with balance deficits very similar to last week when first revision done but worsened cognitive function which affects safety with mobility. Recommend return to oupt neuro PT and SLP at d/c. Pt will benefit from skilled PT to increase their independence and safety with mobility to allow discharge to the venue listed below.       Follow Up Recommendations Outpatient PT(neuro)    Equipment Recommendations  None recommended by PT    Recommendations for Other Services Speech consult     Precautions / Restrictions Precautions Precautions: Fall Precaution Comments: pt with increasing shuffling gait yesterday but no fall Restrictions Weight Bearing Restrictions: No      Mobility  Bed Mobility Overal bed mobility: Independent                Transfers Overall transfer level: Modified independent Equipment used: None             General transfer comment: stood safely without LOB  Ambulation/Gait Ambulation/Gait assistance: Supervision Gait Distance (Feet): 500 Feet Assistive device: None Gait Pattern/deviations: Step-through pattern Gait velocity: Decreased Gait velocity interpretation: 1.31 - 2.62 ft/sec, indicative of limited community ambulator General Gait Details: decreased step height and was reaching for wall rail initially though was able to stop this with cueing. Worked on slow and fast walking as well as  bkwd walking  Stairs Stairs: Yes Stairs assistance: Supervision Stair Management: One rail Right;Forwards;Alternating pattern Number of Stairs: 4 General stair comments: limited by IV line. no physical difficulty on stairs, needed some cueing to not turn around in IV line  Wheelchair Mobility    Modified Rankin (Stroke Patients Only)       Balance Overall balance assessment: Needs assistance Sitting-balance support: No upper extremity supported Sitting balance-Leahy Scale: Good     Standing balance support: No upper extremity supported Standing balance-Leahy Scale: Good Standing balance comment: limitations evident with challenge                             Pertinent Vitals/Pain Pain Assessment: No/denies pain    Home Living Family/patient expects to be discharged to:: Private residence Living Arrangements: Spouse/significant other Available Help at Discharge: Family;Available 24 hours/day Type of Home: House Home Access: Stairs to enter Entrance Stairs-Rails: Can reach both Entrance Stairs-Number of Steps: 5 Home Layout: Two level;1/2 bath on main level;Bed/bath upstairs Home Equipment: Walker - 2 wheels;Cane - single point      Prior Function Level of Independence: Needs assistance   Gait / Transfers Assistance Needed: Has been working with OP neuro PT on higher level balance and cognition.      Comments: works in [employer]ing [employer] for [employer]; umpires for baseball games, went home w/ wife before most recent revision, was ambulating w/o AD     Hand Dominance   Dominant Hand: Right    Extremity/Trunk Assessment   Upper Extremity Assessment Upper Extremity Assessment: Overall WFL for tasks assessed    Lower Extremity Assessment Lower  Extremity Assessment: LLE deficits/detail LLE Deficits / Details: some ataxic motions noted initially but improved once moving more. No strength deficits noted LLE Sensation: WNL LLE Coordination: decreased gross motor     Cervical / Trunk Assessment Cervical / Trunk Assessment: Normal  Communication   Communication: Receptive difficulties;Expressive difficulties  Cognition Arousal/Alertness: Awake/alert Behavior During Therapy: WFL for tasks assessed/performed Overall Cognitive Status: Impaired/Different from baseline Area of Impairment: Orientation;Attention;Memory;Following commands;Safety/judgement;Problem solving                 Orientation Level: Disoriented to;Person;Place;Time Current Attention Level: Selective Memory: Decreased short-term memory Following Commands: Follows one step commands inconsistently   Awareness: Emergent Problem Solving: Slow processing;Decreased initiation;Requires verbal cues;Difficulty sequencing;Requires tactile cues General Comments: Pt unable to state his birth year at beginning of session but was able to at end. Had word finding difficulties throughout. Followed simple one step commands consistently but when they involved more complex tasks he had difficulty, for example, "turn left" was easy for him, "place your left foot in front of your right like on a balance beam" was very difficult for him to understand.       General Comments General comments (skin integrity, edema, etc.): wife present    Exercises     Assessment/Plan    PT Assessment Patient needs continued PT services  PT Problem List Decreased balance;Decreased mobility;Decreased cognition;Decreased coordination;Decreased knowledge of precautions       PT Treatment Interventions Gait training;Stair training;Functional mobility training;Therapeutic activities;Therapeutic exercise;Balance training;Neuromuscular re-education;Cognitive remediation;Patient/family education    PT Goals (Current goals can be found in the Care Plan section)  Acute Rehab PT Goals Patient Stated Goal: Return home and continue with OP Neuro PT PT Goal Formulation: With patient/family Time For Goal Achievement:  12/24/18 Potential to Achieve Goals: Good    Frequency Min 3X/week   Barriers to discharge        Co-evaluation               AM-PAC PT "6 Clicks" Mobility  Outcome Measure Help needed turning from your back to your side while in a flat bed without using bedrails?: None Help needed moving from lying on your back to sitting on the side of a flat bed without using bedrails?: None Help needed moving to and from a bed to a chair (including a wheelchair)?: None Help needed standing up from a chair using your arms (e.g., wheelchair or bedside chair)?: A Little Help needed to walk in hospital room?: A Little Help needed climbing 3-5 steps with a railing? : A Little 6 Click Score: 21    End of Session Equipment Utilized During Treatment: Gait belt Activity Tolerance: Patient tolerated treatment well Patient left: in bed;with call bell/phone within reach;with family/visitor present Nurse Communication: Mobility status PT Visit Diagnosis: Unsteadiness on feet (R26.81);Other abnormalities of gait and mobility (R26.89)    Time: [id number] PT Time Calculation (min) (ACUTE ONLY): 23 min   Charges:   PT Evaluation $PT Eval Moderate Complexity: 1 Mod PT Treatments $Gait Training: 8-22 mins        Lyanne CoVictoria Chaselynn Kepple, PT  Acute Rehab Services  Pager [phone] Office [phone]   Lawana ChambersVictoria L Mahati Vajda 12/10/2018, 10:35 AM

## 2018-12-10 NOTE — Progress Notes (Signed)
Nurse went over discharge with patient and family. Patient and family verbalized understanding of discharge. All belongings sent with patient  All questions and concerns addressed. Taking down in a wheel chair.

## 2018-12-10 NOTE — Progress Notes (Signed)
OT Evaluation  PTA, pt living at home with his wife and had returned to completing his ADL tasks independently. Pt was completing chores around the house and was alone at times during the day. Pt demonstrates a significant functional decline due to below listed deficits and will benefit from follow up with Outpt OT at the Neuro outpt center and will need 24/7 S after DC. Family is able to provide necessary level of assistance at DC.     12/10/18 1400  OT Visit Information  Last OT Received On 12/10/18  Assistance Needed +1  History of Present Illness Pt is a 63 y.o. male s/p shunt revision 12/06/18 who presents with increasing ataxia and decreased mental status after d/c home. 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   Precautions  Precautions Fall  Precaution Comments pt with increasing shuffling gait yesterday but no fall  Restrictions  Weight Bearing Restrictions No  Home Living  Family/patient expects to be discharged to: Private residence  Living Arrangements Spouse/significant other  Available Help at Discharge Family;Available 24 hours/day  Type of Home House  Home Access Stairs to enter  Entrance Stairs-Number of Steps 5  Entrance Stairs-Rails Can reach both  Home Layout Two level;1/2 bath on main level;Bed/bath upstairs  Alternate Level Stairs-Number of Steps flight   Alternate Level Stairs-Rails Left  Bathroom Shower/Tub Tub/shower unit;Walk-in Cytogeneticist Yes  How Accessible Accessible via walker  [place] - 2 wheels;Cane - single point  Prior Function  Level of Independence Needs assistance  Gait / Transfers Assistance Needed Has been working with OP neuro PT on higher level balance and cognition.   Communication / Swallowing Assistance Needed receptive and expressive communication deficits  Comments works in Engineer, technical sales for [employer];  umpires for baseball games, went home w/ wife before most recent revision, was ambulating w/o AD; was completing chores and on the computer PTA  Communication  Communication Receptive difficulties;Expressive difficulties  Pain Assessment  Pain Assessment No/denies pain  Cognition  Arousal/Alertness Awake/alert  Behavior During Therapy Impulsive  Overall Cognitive Status Impaired/Different from baseline  Area of Impairment Orientation;Attention;Memory;Following commands;Safety/judgement;Problem solving;Awareness  Orientation Level Disoriented to;Place;Time;Situation  Current Attention Level Sustained  Memory Decreased short-term memory  Following Commands Follows one step commands inconsistently  Safety/Judgement Decreased awareness of safety;Decreased awareness of deficits  Awareness Emergent  Problem Solving Slow processing;Decreased initiation;Requires verbal cues;Difficulty sequencing;Requires tactile cues  General Comments Apparent motor planning difficulties  Upper Extremity Assessment  Upper Extremity Assessment Overall WFL for tasks assessed  Lower Extremity Assessment  Lower Extremity Assessment Defer to PT evaluation  Cervical / Trunk Assessment  Cervical / Trunk Assessment Normal  ADL  Overall ADL's  Needs assistance/impaired  Functional mobility during ADLs Min guard  General ADL Comments Requires mod vc to sequence tasks; reveiwed home set up to increase success with ADL tasks.wife/daughter verbalized understanidng.  Vision- History  Baseline Vision/History Wears glasses  Vision- Assessment  Additional Comments Per wife pt with R field cut; being assessed by Dr. Sabra Heck in [facility]  Perception  Comments will further assess; poor use of space  Praxis  Praxis tested? Deficits  Deficits Ideomotor;Organization  Bed Mobility  Overal bed mobility Independent  Transfers  Overall transfer level Modified independent  Equipment used None  General transfer comment stood safely  without LOB  Balance  Overall balance assessment Needs assistance  Sitting-balance support No upper extremity supported  Sitting balance-Leahy Scale Good  Standing balance support No upper extremity supported  Standing balance-Leahy Scale Good  Standing balance comment limitations evident with challenge  OT - End of Session  Activity Tolerance Patient tolerated treatment well  Patient left in bed;with call bell/phone within reach;with family/visitor present  Nurse Communication Mobility status  OT Assessment  OT Recommendation/Assessment All further OT needs can be met in the next venue of care  OT Visit Diagnosis Other symptoms and signs involving cognitive function  OT Problem List Decreased cognition;Decreased safety awareness  AM-PAC OT "6 Clicks" Daily Activity Outcome Measure (Version 2)  Help from another person eating meals? 3  Help from another person taking care of personal grooming? 2  Help from another person toileting, which includes using toliet, bedpan, or urinal? 2  Help from another person bathing (including washing, rinsing, drying)? 2  Help from another person to put on and taking off regular upper body clothing? 2  Help from another person to put on and taking off regular lower body clothing? 2  6 Click Score 13  OT Recommendation  Follow Up Recommendations Outpatient OT;Supervision/Assistance - 24 hour (neuro outpt)  OT Equipment None recommended by OT  Individuals Consulted  Consulted and Agree with Results and Recommendations Patient;Family member/caregiver  Family Member Consulted wife/daughter  Acute Rehab OT Goals  Patient Stated Goal Return home and continue with OP Neuro PT  OT Time Calculation  OT Start Time (ACUTE ONLY) 1400  OT Stop Time (ACUTE ONLY) 1416  OT Time Calculation (min) 16 min  OT General Charges  $OT Visit 1 Visit  OT Evaluation  $OT Eval Moderate Complexity 1 Mod  Written Expression  Dominant Hand Right  Maurie Boettcher, OT/L    Acute OT Clinical Specialist Acute Rehabilitation Services Pager [phone] Office [phone]

## 2018-12-11 ENCOUNTER — Ambulatory Visit: Payer: BLUE CROSS/BLUE SHIELD | Admitting: Physical Therapy

## 2018-12-12 ENCOUNTER — Ambulatory Visit: Payer: BLUE CROSS/BLUE SHIELD | Admitting: Speech Pathology

## 2018-12-13 ENCOUNTER — Ambulatory Visit: Payer: BLUE CROSS/BLUE SHIELD | Admitting: Physical Therapy

## 2018-12-16 ENCOUNTER — Ambulatory Visit: Payer: BLUE CROSS/BLUE SHIELD | Admitting: Physical Therapy

## 2018-12-16 ENCOUNTER — Encounter: Payer: BLUE CROSS/BLUE SHIELD | Admitting: Internal Medicine

## 2018-12-16 ENCOUNTER — Ambulatory Visit: Payer: BLUE CROSS/BLUE SHIELD

## 2018-12-19 ENCOUNTER — Ambulatory Visit: Payer: BLUE CROSS/BLUE SHIELD | Attending: Physical Medicine & Rehabilitation | Admitting: Speech Pathology

## 2018-12-19 DIAGNOSIS — R41841 Cognitive communication deficit: Secondary | ICD-10-CM

## 2018-12-19 DIAGNOSIS — I69918 Other symptoms and signs involving cognitive functions following unspecified cerebrovascular disease: Secondary | ICD-10-CM | POA: Insufficient documentation

## 2018-12-19 DIAGNOSIS — R4701 Aphasia: Secondary | ICD-10-CM | POA: Diagnosis present

## 2018-12-19 DIAGNOSIS — R29818 Other symptoms and signs involving the nervous system: Secondary | ICD-10-CM | POA: Insufficient documentation

## 2018-12-19 DIAGNOSIS — R2681 Unsteadiness on feet: Secondary | ICD-10-CM | POA: Insufficient documentation

## 2018-12-19 DIAGNOSIS — R41842 Visuospatial deficit: Secondary | ICD-10-CM | POA: Insufficient documentation

## 2018-12-19 DIAGNOSIS — M6281 Muscle weakness (generalized): Secondary | ICD-10-CM | POA: Insufficient documentation

## 2018-12-19 DIAGNOSIS — R2689 Other abnormalities of gait and mobility: Secondary | ICD-10-CM | POA: Diagnosis present

## 2018-12-20 ENCOUNTER — Ambulatory Visit: Payer: BLUE CROSS/BLUE SHIELD | Admitting: Physical Therapy

## 2018-12-20 ENCOUNTER — Encounter: Payer: Self-pay | Admitting: Physical Therapy

## 2018-12-20 ENCOUNTER — Other Ambulatory Visit: Payer: Self-pay

## 2018-12-20 DIAGNOSIS — R41841 Cognitive communication deficit: Secondary | ICD-10-CM | POA: Diagnosis not present

## 2018-12-20 DIAGNOSIS — R2681 Unsteadiness on feet: Secondary | ICD-10-CM

## 2018-12-20 DIAGNOSIS — M6281 Muscle weakness (generalized): Secondary | ICD-10-CM

## 2018-12-20 DIAGNOSIS — R29818 Other symptoms and signs involving the nervous system: Secondary | ICD-10-CM

## 2018-12-20 DIAGNOSIS — R2689 Other abnormalities of gait and mobility: Secondary | ICD-10-CM

## 2018-12-20 NOTE — Therapy (Signed)
[facility] [address] [place], [place], [address] Phone: [phone]   Fax:  [phone]  Physical Therapy Evaluation  Patient Details  Name: Charles Marquez MRN: [id number] Date of Birth: [birth year] Referring Provider (PT): Kathaleen Maser. Pool, MD   Encounter Date: 12/20/2018  PT End of Session - 12/20/18 0907    Visit Number  1    Number of Visits  9    Date for PT Re-Evaluation  02/18/19    Authorization Type  BCBS OTHER     Authorization Time Period  PT/OT/ST/Hydro therapy covered x 90 days    PT Start Time  0803    PT Stop Time  0843    PT Time Calculation (min)  40 min    Activity Tolerance  Patient tolerated treatment well    Behavior During Therapy  [facility] for tasks assessed/performed       Past Medical History:  Diagnosis Date  . Anxiety   . Hypercholesteremia   . Stroke (HCC)    tia's  . TIA (transient ischemic attack)    09.15    Past Surgical History:  Procedure Laterality Date  . Fractured arm Left 12  . HERNIA REPAIR Right 3/12  . LAPAROSCOPIC REVISION VENTRICULAR-PERITONEAL (V-P) SHUNT N/A 08/26/2018   Procedure: LAPAROSCOPIC INSERTION VENTRICULAR-PERITONEAL (V-P) SHUNT;  Surgeon: Julio Sicks, MD;  Location: [facility] OR;  Service: Neurosurgery;  Laterality: N/A;  . LOOP RECORDER INSERTION N/A 04/10/2017   Procedure: Loop Recorder Insertion;  Surgeon: Hillis Range, MD;  Location: [facility] INVASIVE CV LAB;  Service: Cardiovascular;  Laterality: N/A;  . SHUNT REMOVAL Right 03/13/2016   Procedure: SHUNT REMOVAL;  Surgeon: Julio Sicks, MD;  Location: [facility] NEURO ORS;  Service: Neurosurgery;  Laterality: Right;  . SHUNT REMOVAL Right 08/09/2018   Procedure: SHUNT REMOVAL With Placement of Ventricular Catheter;  Surgeon: Lisbeth Renshaw, MD;  Location: [facility] OR;  Service: Neurosurgery;  Laterality: Right;  . SHUNT REVISION Right 08/05/2018   Procedure: SHUNT REVISION;  Surgeon: Julio Sicks, MD;  Location: [facility] OR;  Service:  Neurosurgery;  Laterality: Right;  . SHUNT REVISION Left 12/06/2018   Procedure: Shunt Revision - left;  Surgeon: Julio Sicks, MD;  Location: [facility] OR;  Service: Neurosurgery;  Laterality: Left;  Shunt Revision - left  . SHUNT REVISION N/A 12/09/2018   Procedure: SHUNT REVISION;  Surgeon: Julio Sicks, MD;  [facility];  Service: Neurosurgery;  Laterality: N/A;  . SHUNT REVISION VENTRICULAR-PERITONEAL Left 08/26/2018   Procedure: SHUNT REVISION VENTRICULAR-PERITONEAL;  Surgeon: Julio Sicks, MD;  Location: [facility] OR;  Service: Neurosurgery;  Laterality: Left;  . SHUNT REVISION VENTRICULAR-PERITONEAL Left 09/02/2018   Procedure: Left Occipital VP shunt revision;  Surgeon: Julio Sicks, MD;  Location: [facility] OR;  Service: Neurosurgery;  Laterality: Left;  [name] VASECTOMY  10/02/1997  . 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 VENTRICULOPERITONEAL SHUNT Right 07/22/2018   Procedure: Shunt Placment right occipital;  Surgeon: Julio Sicks, MD;  Location: [facility] OR;  Service: Neurosurgery;  Laterality: Right;  . VENTRICULOPERITONEAL SHUNT Left 08/26/2018   Procedure: LEFT SIDED VENTRICULAR-PERITONEAL SHUNT;  Surgeon: Julio Sicks, MD;  Location: [facility] OR;  Service: Neurosurgery;  Laterality: Left;  [name] VENTRICULOSTOMY Right 08/09/2018   Procedure: VENTRICULOSTOMY;  Surgeon: Lisbeth Renshaw, MD;  Location: [facility] OR;  Service:  Neurosurgery;  Laterality: Right;    There were no vitals filed for this visit.   Subjective Assessment - 12/20/18 0807    Subjective  Returns to therapy after two shunt revisions and admission to hospital.  Had about 48 hours of motor planning impairments/apraxia at home but pt is doing well now but still having some balance issues.  Has lifting restrictions of 10lb.   Pt does have R hemianopsia    Patient  is accompained by:  Family member    Pertinent History   S/p VP shunt, Hydrocephalus, CVA with loop recorder insertion 04/10/2017 as well as R occipital VP shunt placement 2016, Infection of ventricular shunt, shunt malfunction, HLD, anxiety, TIA, Hypercholesteremia, acute blood loss anemia, HTN, Tachycardia, Leukocytosis, Hyponatremia, Bacterial Encephalitis, Bacterial Meningitis, Dyslipidemia, DDD lumbar spine, and Mycoplasma Pneumonia.     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  Yes    Pain Score  2     Pain Location  Abdomen    Pain Orientation  Left    Pain Descriptors / Indicators  Discomfort    Pain Type  Surgical pain         [facility] PT Assessment - 12/20/18 0811      Assessment   Medical Diagnosis  s/p multiple shunt VP revisions, hydrocephalus    Referring Provider (PT)  Kathaleen MaserHenry A. Pool, MD    Onset Date/Surgical Date  12/18/18    Hand Dominance  Right    Prior Therapy  CIR and outpatient therapies      Precautions   Precautions  Other (comment)    Precaution Comments  10lb lifting restriction; R hemianopsia, TIA, CVA, hypercholesteremia, anxiety and ventriculostomy with 10 shunt revisions      Restrictions   Other Position/Activity Restrictions  Lifting restrictions 10 lb      Balance Screen   Has the patient fallen in the past 6 months  No      Home Environment   Living Environment  Private residence    Living Arrangements  Spouse/significant other    Available Help at Discharge  Family    Type of Home  House    Home Access  Stairs to enter    Entrance Stairs-Number of Steps  5    Entrance Stairs-Rails  Can reach both    Home Layout  Two level    Alternate Level Stairs-Number of Steps  12    Alternate Level Stairs-Rails  Left      Prior Function   Level of Independence  Independent    Vocation  Full time  employment    Leisure  Officiating basketball games, umpire baseball, boating, yard work      IT consultantCognition   Divided Attention  Impaired      Observation/Other Assessments   Focus on Therapeutic Outcomes (FOTO)   Not assessed      Sensation   Light Touch  Appears Intact      ROM / Strength   AROM / PROM / Strength  Strength      Strength   Overall Strength  Deficits    Overall Strength Comments  WFL overall except mild weakness in R hamstring: 4+/5      Ambulation/Gait   Ambulation/Gait  Yes    Ambulation/Gait Assistance  5: Supervision    Ambulation/Gait Assistance Details  intermittent min  guard required due to veering and running into objects on R due to hemianopsia    Ambulation Distance (Feet)  115 Feet    Assistive device  None    Gait Pattern  Step-through pattern;Narrow base of support;Decreased dorsiflexion - right    Ambulation Surface  Level;Indoor    Stairs  Yes    Stairs Assistance  5: Supervision    Stairs Assistance Details (indicate cue type and reason)  slight decreased safety when descending due to fast forward momentum    Stair Management Technique  No rails;Forwards    Number of Stairs  8    Height of Stairs  6      Standardized Balance Assessment   Standardized Balance Assessment  10 meter walk test;Timed Up and Go Test    10 Meter Walk  7.56 supervision due to veering; 4.33 ft/sec      Timed Up and Go Test   TUG  Normal TUG;Manual TUG;Cognitive TUG    Normal TUG (seconds)  9.03    Manual TUG (seconds)  8.75    Cognitive TUG (seconds)  11.03   22% difference but both WFL   TUG Comments  supervision      Functional Gait  Assessment   Gait assessed   Yes    Gait Level Surface  Walks 20 ft in less than 5.5 sec, no assistive devices, good speed, no evidence for imbalance, normal gait pattern, deviates no more than 6 in outside of the 12 in walkway width.    Change in Gait Speed  Able to smoothly change walking speed without loss of balance or gait  deviation. Deviate no more than 6 in outside of the 12 in walkway width.    Gait with Horizontal Head Turns  Performs head turns smoothly with slight change in gait velocity (eg, minor disruption to smooth gait path), deviates 6-10 in outside 12 in walkway width, or uses an assistive device.    Gait with Vertical Head Turns  Performs task with slight change in gait velocity (eg, minor disruption to smooth gait path), deviates 6 - 10 in outside 12 in walkway width or uses assistive device    Gait and Pivot Turn  Pivot turns safely within 3 sec and stops quickly with no loss of balance.    Step Over Obstacle  Is able to step over 2 stacked shoe boxes taped together (9 in total height) without changing gait speed. No evidence of imbalance.    Gait with Narrow Base of Support  Ambulates 4-7 steps.    Gait with Eyes Closed  Walks 20 ft, uses assistive device, slower speed, mild gait deviations, deviates 6-10 in outside 12 in walkway width. Ambulates 20 ft in less than 9 sec but greater than 7 sec.    Ambulating Backwards  Walks 20 ft, uses assistive device, slower speed, mild gait deviations, deviates 6-10 in outside 12 in walkway width.    Steps  Alternating feet, no rail.    Total Score  24    FGA comment:  24/30 (supervision when descending stairs)                Objective measurements completed on examination: See above findings.              PT Education - 12/20/18 0907    Education Details  clinical findings and continuation of therapy for 1x/week x 8 weeks; PT POC and goals    Person(s) Educated  Patient;Spouse  Methods  Explanation    Comprehension  Verbalized understanding       PT Short Term Goals - 12/20/18 0911      PT SHORT TERM GOAL #1   Title  = LTG        PT Long Term Goals - 12/20/18 0911      PT LONG TERM GOAL #1   Title  Pt will be independent and compliant with performing PROGRESSED HEP to improve balance, strength, and functional mobility.     Time  8    Period  Weeks    Status  New    Target Date  02/18/19      PT LONG TERM GOAL #2   Title  Pt maintain gait velocity of >4.0 ft/sec but demonstrate decreased veering and running into objects on R due to improved awareness of field cut    Time  8    Period  Weeks    Status  New    Target Date  02/18/19      PT LONG TERM GOAL #3   Title  Pt will demonstrate 12 steps with no HR's at mod I level with improved control for forward momentum when descending    Time  8    Period  Weeks    Status  New    Target Date  02/18/19      PT LONG TERM GOAL #4   Title  Pt will improve FGA score to >/=26/30 indicating improvement in functional mobility.     Baseline  24/30    Time  8    Period  Weeks    Status  New    Target Date  02/18/19      PT LONG TERM GOAL #5   Title  Pt will demonstrate improved ability to perform dual tasking as indicated by <20% difference between TUG and COG TUG    Baseline  22% difference (9.03 TUG, 11.03 COG TUG)    Time  8    Period  Weeks    Status  New    Target Date  02/18/19             Plan - 12/20/18 0908    Clinical Impression Statement  Pt is a 63 year old male well known to this clinic and therapist, referred back to Neuro OPPT for evaluation s/p 2 new shunt revisions.  [name] PMH is significant for the following: TIA, CVA, hypercholesteremia, anxiety and ventriculostomy with 10 shunt revisions. The following deficits were noted during pt's exam: pt continues to demonstrate impairments in motor planning, dynamic balance, gait, vision, and cognition - especially during dual tasks.  [name] TUG, gait velocity and FGA score indicates pt is at a lower risk for falls and functional level has maintained since last visit in January. Pt would benefit from skilled PT to continue to address these impairments and functional limitations to maximize functional mobility independence and reduce falls risk.    History and Personal Factors relevant to plan of care:   10lb lifting restriction, R hemianopsia, cognitive impairments with impaired dual tasking, TIA, CVA, hypercholesteremia, anxiety and ventriculostomy with 10 shunt revisions    Clinical Presentation  Evolving    Clinical Presentation due to:  10lb lifting restriction, R hemianopsia, cognitive impairments with impaired dual tasking, TIA, CVA, hypercholesteremia, anxiety and ventriculostomy with 10 shunt revisions    Clinical Decision Making  Moderate    Rehab Potential  Good    PT Frequency  1x / week  PT Duration  8 weeks    PT Treatment/Interventions  ADLs/Self Care Home Management;Therapeutic activities;Therapeutic exercise;Balance training;Neuromuscular re-education;Visual/perceptual remediation/compensation;Patient/family education;Stair training;Gait training;Functional mobility training;DME Instruction;Cognitive remediation    PT Next Visit Plan  Review and Revise HEP; Treadmill working on step length and foot clearance then carry over to level ground.  Continue to work on balance with eyes closed.  activities with dual tasking.  Floor ladder.  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  high level balance and strengthening in quadruped/ tall kneeling.    PT Home Exercise Plan  [id number]     Consulted and Agree with Plan of Care  Patient;Family member/caregiver    Family Member Consulted  wife       Patient will benefit from skilled therapeutic intervention in order to improve the following deficits and impairments:  Abnormal gait, Decreased safety awareness, Impaired vision/preception, Decreased balance, Decreased cognition, Decreased strength, Difficulty walking  Visit Diagnosis: Other symptoms and signs involving the nervous system  Unsteadiness on feet  Other abnormalities of gait and mobility  Muscle weakness (generalized)     Problem List Patient Active Problem List   Diagnosis Date Noted  . Visual disturbance   .  Slow transit constipation   . Hypoalbuminemia due to protein-calorie malnutrition (HCC)   . Acute blood loss anemia   . S/P VP shunt   . Hydrocephalus (HCC) 08/30/2018  . Dyslipidemia   . History of CVA (cerebrovascular accident)   . Benign essential HTN   . Tachycardia   . Leukocytosis   . Hyponatremia   . Hypokalemia   . 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 Bacterial meningitis 08/09/2018  . TIA (transient ischemic attack) 01/29/2017  . Acute encephalopathy   . Shunt malfunction 03/13/2016  . 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 Hyperlipidemia 10/20/2014  . Degenerative disc disease, lumbar 04/15/2013  . Routine general medical examination at a health care facility 07/23/2012  . DISTURBANCE OF SKIN SENSATION 10/05/2008  . HYPERLIPIDEMIA 01/01/2008  . MYCOPLASMA PNEUMONIA 01/01/2008    Dierdre Highman, PT, DPT 12/20/18    9:19 AM    [facility] [address] [place], [place], [address] Phone: [phone]   Fax:  [phone]  Name: Charles Marquez MRN: [id number] Date of Birth: [birth year]

## 2018-12-20 NOTE — Therapy (Signed)
[facility] [address] [place], [place], [address] Phone: [phone]   Fax:  [phone]  Speech Language Pathology Treatment  Patient Details  Name: Charles Marquez MRN: [id number] Date of Birth: 11/21/1955 Referring Provider (SLP): Dr. Patel   Encounter Date: 12/19/2018  End of Session - 12/20/18 0803    Visit Number  19    Number of Visits  25    Date for SLP Re-Evaluation  01/17/19    Authorization Type  BCBS 90 visits for all disciplines     SLP Start Time  1626    SLP Stop Time   1655   session terminated early due to power outage at facility   SLP Time Calculation (min)  29 min    Activity Tolerance  Patient tolerated treatment well       Past Medical History:  Diagnosis Date  . Anxiety   . Hypercholesteremia   . Stroke (HCC)    tia's  . TIA (transient ischemic attack)    09.15    Past Surgical History:  Procedure Laterality Date  . Fractured arm Left 12  . HERNIA REPAIR Right 3/12  . LAPAROSCOPIC REVISION VENTRICULAR-PERITONEAL (V-P) SHUNT N/A 08/26/2018   Procedure: LAPAROSCOPIC INSERTION VENTRICULAR-PERITONEAL (V-P) SHUNT;  Surgeon: Marquez, Henry, MD;  [facility];  Service: Neurosurgery;  Laterality: N/A;  . LOOP RECORDER INSERTION N/A 04/10/2017   Procedure: Loop Recorder Insertion;  Surgeon: Marquez, James, MD;  [facility]: [facility] INVASIVE CV LAB;  Service: Cardiovascular;  Laterality: N/A;  . SHUNT REMOVAL Right 03/13/2016   Procedure: SHUNT REMOVAL;  Surgeon: Charles Pool, MD;  Location: [facility] NEURO ORS;  Service: Neurosurgery;  Laterality: Right;  . SHUNT REMOVAL Right 08/09/2018   Procedure: SHUNT REMOVAL With Placement of Ventricular Catheter;  Surgeon: Marquez, Neelesh, MD;  [facility];  Service: Neurosurgery;  Laterality: Right;  . SHUNT REVISION Right 08/05/2018   Procedure: SHUNT REVISION;  Surgeon: Marquez, Henry, MD;  [facility];  Service: Neurosurgery;  Laterality: Right;  . SHUNT  REVISION Left 12/06/2018   Procedure: Shunt Revision - left;  Surgeon: Marquez, Henry, MD;  [facility];  Service: Neurosurgery;  Laterality: Left;  Shunt Revision - left  . SHUNT REVISION N/A 12/09/2018   Procedure: SHUNT REVISION;  Surgeon: Marquez, Henry, MD;  [facility];  Service: Neurosurgery;  Laterality: N/A;  . SHUNT REVISION VENTRICULAR-PERITONEAL Left 08/26/2018   Procedure: SHUNT REVISION VENTRICULAR-PERITONEAL;  Surgeon: Marquez, Henry, MD;  [facility];  Service: Neurosurgery;  Laterality: Left;  . SHUNT REVISION VENTRICULAR-PERITONEAL Left 09/02/2018   Procedure: Left Occipital VP shunt revision;  Surgeon: Marquez, Henry, MD;  [facility];  Service: Neurosurgery;  Laterality: Left;  . VASECTOMY  10/02/1997  . VENTRICULOPERITONEAL SHUNT Right 12/18/2014   Procedure: Shunt Placment - right occipital VP shunt ;  Surgeon: Charles A Pool, MD;  [facility];  Service: Neurosurgery;  Laterality: Right;  Shunt Placment - right occipital VP shunt   . VENTRICULOPERITONEAL SHUNT Right 07/22/2018   Procedure: Shunt Placment right occipital;  Surgeon: Marquez, Henry, MD;  [facility];  Service: Neurosurgery;  Laterality: Right;  . VENTRICULOPERITONEAL SHUNT Left 08/26/2018   Procedure: LEFT SIDED VENTRICULAR-PERITONEAL SHUNT;  Surgeon: Marquez, Henry, MD;  [facility];  Service: Neurosurgery;  Laterality: Left;  . VENTRICULOSTOMY Right 08/09/2018   Procedure: VENTRICULOSTOMY;  Surgeon: Marquez, Neelesh, MD;  [facility];  Service: Neurosurgery;  Laterality: Right;    There were no vitals   filed for this visit.  Subjective Assessment - 12/20/18 0759    Subjective  Pt had 2 shunt revisions 12/06/18 and 12/09/18    Patient is accompained by:  Family member    Currently in Pain?  No/denies            ADULT SLP TREATMENT - 12/19/18 1626      General Information   Behavior/Cognition  Alert;Cooperative;Pleasant mood      Treatment Provided   Treatment provided   Cognitive-Linquistic      Pain Assessment   Pain Assessment  No/denies pain      Cognitive-Linquistic Treatment   Treatment focused on  Cognition;Aphasia    Skilled Treatment  SLP reassessed pt's cognition via MOCA-Basic; pt, wife reporting changes, primarily with cognition though some language, after VP shunt revisions 1/24 and 12/09/18. Pt has been working on speech activities with [name] since discharge, wife feels improving over the past week. Pt scored 25/30 on MOCA Basic (26 and above is within normal limits). Supplemental clock drawing is impaired (no clock face drawn, number spacing/placement is mildly disorganized) but significant improvements noted compared with original clock drawing on CLQT 09/24/18. Today correct time/hand placement is shown, all numbers present with none perseverated. Divergent naming is mildly impaired, with vague responses "red berries" and perseverations x3 when naming fruits (8 correct in 1 min). Pt stalled for an extended period with orientation questions, aware and discouraged that he could not state the current year. Ultimately he did select the correct choice from F:4. Session terminated early due to facility power outage.       Assessment / Recommendations / Plan   Plan  Goals updated      Progression Toward Goals   Progression toward goals  Progressing toward goals         SLP Short Term Goals - 12/20/18 0805      SLP SHORT TERM GOAL #1   Title  Pt will complete standardized assessment of cognition.    Status  Achieved      SLP SHORT TERM GOAL #2   Title  Pt will ID object/picture to simple description/feature/function f:4 with occasional min A over 3 sessions     Status  Partially Met      SLP SHORT TERM GOAL #3   Title  Pt will demo auditory comprehension of 5 minutes simple-mod complex conversation by responding appropriately or asking questions over 2 sessions    Status  Achieved      SLP SHORT TERM GOAL #4   Title  Pt will name  basic objects/pictures 7/10 correct with occasional mod A over 3 sessions     Status  Partially Met      SLP SHORT TERM GOAL #5   Title  Pt will demo sustained attention for 10 minutes in simple cognitive-linguistic task x3 sessions.    Status  Achieved      SLP SHORT TERM GOAL #6   Title  Pt will have a memory system to assist in managing appointments, schedules, medical and therapy information and bring with him to 3 therapy sessions     Status  Achieved       SLP Long Term Goals - 12/20/18 0806      SLP LONG TERM GOAL #1   Title  Pt will write or name 5 words in personally relevant category with occasional min A over 4 sessions     Status  Partially Met      SLP LONG TERM  GOAL #2   Title  Pt will demo error awareness by attempting correction or by nonverbal response to errors 75% of the time over 3 sessions    Status  Achieved      SLP LONG TERM GOAL #3   Title  Pt will participate functionally in 10 minutes simple-mod complex conversation with conversational supports for aphasia over 3 sessions.    Status  Achieved      SLP LONG TERM GOAL #4   Title  Pt will demo selective attention in min noisy environment for 10 minutes in a simple-mod complex cognitive linguistic task over three sessions    Status  Deferred      SLP LONG TERM GOAL #5   Title  Pt will utilize memory compensation system to recall details/manage appointments, schedules, medical and therapy information with rare min A over 4 sessions      Baseline  11/28/18    Time  4   4 weeks or 25 total visits for all active LTGs, renewed 12/19/18 after hospital admission   Period  Weeks    Status  Revised      SLP LONG TERM GOAL #6   Title  Pt will demo WFL reading comprehension for high interest materials (texts, emails, news articles)by answering questions,summarizing material, or composing responses x3 sessions.    Time  4    Period  Weeks    Status  Revised      SLP LONG TERM GOAL #7   Title  Pt will generate  appropriate written or typed responses to emails and text messages >90% accuracy (compensations allowed) x 3 sessions    Time  4    Period  Weeks    Status  Revised      SLP LONG TERM GOAL #8   Title  Pt will participate functionally in 10 minutes mod complex conversation with conversational supports for aphasia over 3 sessions.    Time  4    Period  Weeks    Status  Revised       Plan - 12/20/18 0804    Clinical Impression Statement  Mr. Dentler continues to present with aphasia, attention and memory impairments. Reasessment completed via MOCA-Basic and supplemental cognitive-linguistic tasks, showing overall mild impairments. Simple conversation today is fluent, though hesitative and circumlocutory. Anxiety continues to play a role in pt's performance; suspect this hinders/slows his processing and response time. Long-term goals will be renewed/extended today for total of 4 weeks/25 visits. Continue skilled ST to maximize communication and cognition for wants/needs, safety, independence and QOL.    Speech Therapy Frequency  2x / week    Duration  --   25 visits   Treatment/Interventions  Cognitive reorganization;Multimodal communcation approach;Environmental controls;Compensatory strategies;Language facilitation;Compensatory techniques;Cueing hierarchy;Internal/external aids;Functional tasks;SLP instruction and feedback;Patient/family education    Potential to Achieve Goals  Good    Potential Considerations  Severity of impairments    Consulted and Agree with Plan of Care  Patient;Family member/caregiver    Family Member Consulted  spouse       Patient will benefit from skilled therapeutic intervention in order to improve the following deficits and impairments:   Cognitive communication deficit  Aphasia    Problem List Patient Active Problem List   Diagnosis Date Noted  . Visual disturbance   . Slow transit constipation   . Hypoalbuminemia due to protein-calorie malnutrition  (HCC)   . Acute blood loss anemia   . S/P VP shunt   . Hydrocephalus (HCC) 08/30/2018  .   Dyslipidemia   . History of CVA (cerebrovascular accident)   . Benign essential HTN   . Tachycardia   . Leukocytosis   . Hyponatremia   . Hypokalemia   . 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 Bacterial meningitis 08/09/2018  . TIA (transient ischemic attack) 01/29/2017  . Acute encephalopathy   . Shunt malfunction 03/13/2016  . 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 Hyperlipidemia 10/20/2014  . Degenerative disc disease, lumbar 04/15/2013  . Routine general medical examination at a health care facility 07/23/2012  . DISTURBANCE OF SKIN SENSATION 10/05/2008  . HYPERLIPIDEMIA 01/01/2008  . MYCOPLASMA PNEUMONIA 01/01/2008   Deneise Lever, [place], [place] 12/20/2018, 8:25 AM  [facility] [address] [place], [place], [address] Phone: [phone]   Fax:  [phone]   Name: Charles Marquez MRN: [id number] Date of Birth: [birth year]

## 2018-12-20 NOTE — Therapy (Signed)
[facility] [address] [place], [place], [address] Phone: [phone]   Fax:  [phone]  Patient Details  Name: Charles Marquez MRN: [id number] Date of Birth: 1955-12-29 Referring Provider:  No ref. provider found  Encounter Date: 12/20/2018 - late encounter for D/C from therapy on 11/29/18 due to hospitalization due to shunt revision  PHYSICAL THERAPY DISCHARGE SUMMARY  Visits from Start of Care: 18  Current functional level related to goals / functional outcomes: Pt was making good progress towards goals and was set to D/C from therapy in 5 more visits but pt was hospitalized for shunt revision.  Will perform new evaluation and re-assess patient's impairments and functional limitations when he is cleared to return to therapy.    Remaining deficits: Unknown - pt hospitalized   Education / Equipment: HEP  Plan: Patient agrees to discharge.  Patient goals were not met. Patient is being discharged due to a change in medical status.  ?????     Rico Junker, PT, DPT 12/20/18    8:52 AM    Rico Junker 12/20/2018, 8:52 AM  [facility] [address] [place], [place], [address] Phone: [phone]   Fax:  [phone]

## 2018-12-23 ENCOUNTER — Ambulatory Visit: Payer: BLUE CROSS/BLUE SHIELD | Admitting: Physical Therapy

## 2018-12-23 ENCOUNTER — Encounter: Payer: Self-pay | Admitting: Physical Therapy

## 2018-12-23 ENCOUNTER — Ambulatory Visit: Payer: BLUE CROSS/BLUE SHIELD | Admitting: Speech Pathology

## 2018-12-23 DIAGNOSIS — R41841 Cognitive communication deficit: Secondary | ICD-10-CM | POA: Diagnosis not present

## 2018-12-23 DIAGNOSIS — R2689 Other abnormalities of gait and mobility: Secondary | ICD-10-CM

## 2018-12-23 DIAGNOSIS — R2681 Unsteadiness on feet: Secondary | ICD-10-CM

## 2018-12-23 DIAGNOSIS — R29818 Other symptoms and signs involving the nervous system: Secondary | ICD-10-CM

## 2018-12-23 DIAGNOSIS — R4701 Aphasia: Secondary | ICD-10-CM

## 2018-12-23 DIAGNOSIS — M6281 Muscle weakness (generalized): Secondary | ICD-10-CM

## 2018-12-23 NOTE — Therapy (Signed)
[facility] [address] [place], [place], [address] Phone: [phone]   Fax:  [phone]  Physical Therapy Treatment  Patient Details  Name: Charles Marquez MRN: [id number] Date of Birth: 27-Jan-1956 Referring Provider (PT): Kathaleen MaserHenry A. Pool, MD   Encounter Date: 12/23/2018  PT End of Session - 12/23/18 1632    Visit Number  2    Number of Visits  9    Date for PT Re-Evaluation  02/18/19    Authorization Type  BCBS OTHER     Authorization Time Period  PT/OT/ST/Hydro therapy covered x 90 days    PT Start Time  1530    PT Stop Time  1615    PT Time Calculation (min)  45 min    Activity Tolerance  Patient tolerated treatment well    Behavior During Therapy  WFL for tasks assessed/performed       Past Medical History:  Diagnosis Date  . Anxiety   . Hypercholesteremia   . Stroke (HCC)    tia's  . TIA (transient ischemic attack)    09.15    Past Surgical History:  Procedure Laterality Date  . Fractured arm Left 12  . HERNIA REPAIR Right 3/12  . LAPAROSCOPIC REVISION VENTRICULAR-PERITONEAL (V-P) SHUNT N/A 08/26/2018   Procedure: LAPAROSCOPIC INSERTION VENTRICULAR-PERITONEAL (V-P) SHUNT;  Surgeon: Julio SicksPool, Henry, MD;  [facility];  Service: Neurosurgery;  Laterality: N/A;  . LOOP RECORDER INSERTION N/A 04/10/2017   Procedure: Loop Recorder Insertion;  Surgeon: Hillis RangeAllred, James, MD;  [facility];  Service: Cardiovascular;  Laterality: N/A;  . SHUNT REMOVAL Right 03/13/2016   Procedure: SHUNT REMOVAL;  Surgeon: Julio SicksHenry Pool, MD;  [facility];  Service: Neurosurgery;  Laterality: Right;  . SHUNT REMOVAL Right 08/09/2018   Procedure: SHUNT REMOVAL With Placement of Ventricular Catheter;  Surgeon: Lisbeth RenshawNundkumar, Neelesh, MD;  [facility];  Service: Neurosurgery;  Laterality: Right;  . SHUNT REVISION Right 08/05/2018   Procedure: SHUNT REVISION;  Surgeon: Julio SicksPool, Henry, MD;  [facility];  Service:  Neurosurgery;  Laterality: Right;  . SHUNT REVISION Left 12/06/2018   Procedure: Shunt Revision - left;  Surgeon: Julio SicksPool, Henry, MD;  [facility];  Service: Neurosurgery;  Laterality: Left;  Shunt Revision - left  . SHUNT REVISION N/A 12/09/2018   Procedure: SHUNT REVISION;  Surgeon: Julio SicksPool, Henry, MD;  [facility];  Service: Neurosurgery;  Laterality: N/A;  . SHUNT REVISION VENTRICULAR-PERITONEAL Left 08/26/2018   Procedure: SHUNT REVISION VENTRICULAR-PERITONEAL;  Surgeon: Julio SicksPool, Henry, MD;  [facility];  Service: Neurosurgery;  Laterality: Left;  . SHUNT REVISION VENTRICULAR-PERITONEAL Left 09/02/2018   Procedure: Left Occipital VP shunt revision;  Surgeon: Julio SicksPool, Henry, MD;  [facility];  Service: Neurosurgery;  Laterality: Left;  [name]. VASECTOMY  10/02/1997  . VENTRICULOPERITONEAL SHUNT Right 12/18/2014   Procedure: Shunt Placment - right occipital VP shunt ;  Surgeon: Temple PaciniHenry A Pool, MD;  [facility];  Service: Neurosurgery;  Laterality: Right;  Shunt Placment - right occipital VP shunt   . VENTRICULOPERITONEAL SHUNT Right 07/22/2018   Procedure: Shunt Placment right occipital;  Surgeon: Julio SicksPool, Henry, MD;  [facility];  Service: Neurosurgery;  Laterality: Right;  . VENTRICULOPERITONEAL SHUNT Left 08/26/2018   Procedure: LEFT SIDED VENTRICULAR-PERITONEAL SHUNT;  Surgeon: Julio SicksPool, Henry, MD;  [facility];  Service: Neurosurgery;  Laterality: Left;  [name]. VENTRICULOSTOMY Right 08/09/2018   Procedure: VENTRICULOSTOMY;  Surgeon: Lisbeth RenshawNundkumar, Neelesh, MD;  [facility];  Service:  Neurosurgery;  Laterality: Right;    There were no vitals filed for this visit.  Subjective Assessment - 12/23/18 1535    Subjective  No issues over the weekend.  Sister here to observe therapy today.    Patient is accompained by:  Family member    Pertinent History   S/p VP shunt, Hydrocephalus, CVA with loop recorder insertion 04/10/2017 as well as R occipital VP shunt placement 2016, Infection of  ventricular shunt, shunt malfunction, HLD, anxiety, TIA, Hypercholesteremia, acute blood loss anemia, HTN, Tachycardia, Leukocytosis, Hyponatremia, Bacterial Encephalitis, Bacterial Meningitis, Dyslipidemia, DDD lumbar spine, and Mycoplasma Pneumonia.     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  No/denies          Access Code: [email or web address]  URL: [email or web address]  Date: 12/23/2018  Prepared by: Bufford Lope   Exercises  Romberg Stance with Head Nods - 10 reps - 1x daily - 5x weekly  Romberg Stance with Head Rotation - 1 sets - 10 reps - 1x daily - 5x weekly  Romberg Stance Eyes Closed on Foam Pad - 3 sets - 30 hold - 1x daily - 5x weekly  Standing Single Leg Stance with Counter Support - 3 sets - 10 SECONDS hold - 1x daily - 3x weekly  Walking Tandem Stance - 10 reps - 4 sets - 1x daily - 3x weekly  Side Step with Agility Ladder - 3 sets - 2x daily - 5x weekly  Toe Walking with Counter Support - 10 reps - 4 sets - 1x daily - 7x weekly  Standing Terminal Knee Extension with Resistance - 8 reps - 2 sets - 5 second hold - 2x daily - 5x weekly  Tandem Stance - 4 sets - 10 seconds hold - 1x daily - 5x weekly  Heel Walking with Counter Support - 10 reps - 4 sets - 1x daily - 3x weekly               PT Education - 12/23/18 1632    Education Details  updated HEP    Person(s) Educated  Patient;Spouse    Methods  Explanation;Handout;Demonstration    Comprehension  Verbalized understanding;Returned demonstration       PT Short Term Goals - 12/20/18 0911      PT SHORT TERM GOAL #1   Title  = LTG        PT Long Term Goals - 12/20/18 0911      PT LONG TERM GOAL #1   Title  Pt will be independent and compliant with performing PROGRESSED HEP to improve balance, strength, and functional mobility.    Time  8     Period  Weeks    Status  New    Target Date  02/18/19      PT LONG TERM GOAL #2   Title  Pt maintain gait velocity of >4.0 ft/sec but demonstrate decreased veering and running into objects on R due to improved awareness of field cut    Time  8    Period  Weeks    Status  New    Target Date  02/18/19      PT LONG TERM GOAL #3   Title  Pt will demonstrate 12 steps with no HR's at mod I level with improved control for forward momentum when descending    Time  8    Period  Weeks    Status  New    Target Date  02/18/19      PT LONG TERM GOAL #4   Title  Pt will improve FGA score to >/=26/30 indicating improvement in functional mobility.     Baseline  24/30    Time  8    Period  Weeks    Status  New    Target Date  02/18/19      PT LONG TERM GOAL #5   Title  Pt will demonstrate improved ability to perform dual tasking as indicated by <20% difference between TUG and COG TUG    Baseline  22% difference (9.03 TUG, 11.03 COG TUG)    Time  8    Period  Weeks    Status  New    Target Date  02/18/19            Plan - 12/23/18 1632    Clinical Impression Statement  Treatment session today focused on review of current HEP, removal of exercises that may place increased strain on abdominal incision and updated current exercises.  Changed resisted side stepping to quick side stepping for agility.  Focused on controlled terminal hip and knee extension with resistance.  Updated and progressed corner balance exercises and focused on pt being more conciously aware of limits of stability and base of support.  Will continue to review and update HEP next session.    Rehab Potential  Good    PT Frequency  1x / week    PT Duration  8 weeks    PT Treatment/Interventions  ADLs/Self Care Home Management;Therapeutic activities;Therapeutic exercise;Balance training;Neuromuscular re-education;Visual/perceptual remediation/compensation;Patient/family education;Stair training;Gait training;Functional  mobility training;DME Instruction;Cognitive remediation    PT Next Visit Plan  continue to Review and Revise HEP - tandem gait, heel walking, toe walking, SLS at counter; Treadmill working on step length and foot clearance then carry over to level ground.  Continue to work on balance with eyes closed.  activities with dual tasking.  Floor ladder.  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  high level balance and strengthening in quadruped/ tall kneeling.    PT Home Exercise Plan  [email or web address]     Consulted and Agree with Plan of Care  Patient;Family member/caregiver    Family Member Consulted  wife       Patient will benefit from skilled therapeutic intervention in order to improve the following deficits and impairments:  Abnormal gait, Decreased safety awareness, Impaired vision/preception, Decreased balance, Decreased cognition, Decreased strength, Difficulty walking  Visit Diagnosis: Other symptoms and signs involving the nervous system  Unsteadiness on feet  Other abnormalities of gait and mobility  Muscle weakness (generalized)     Problem List Patient Active Problem List   Diagnosis Date Noted  . Visual disturbance   . Slow transit constipation   . Hypoalbuminemia due to protein-calorie malnutrition (HCC)   . Acute blood loss anemia   . S/P VP shunt   . Hydrocephalus (HCC) 08/30/2018  . Dyslipidemia   . History of CVA (cerebrovascular accident)   . Benign essential HTN   . Tachycardia   . Leukocytosis   . Hyponatremia   . Hypokalemia   . 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 Bacterial meningitis 08/09/2018  . TIA (transient ischemic attack) 01/29/2017  . Acute encephalopathy   . Shunt malfunction 03/13/2016  . 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 Communicating hydrocephalus (  HCC) 12/18/2014  . Hyperlipidemia 10/20/2014  . Degenerative disc  disease, lumbar 04/15/2013  . Routine general medical examination at a health care facility 07/23/2012  . DISTURBANCE OF SKIN SENSATION 10/05/2008  . HYPERLIPIDEMIA 01/01/2008  . MYCOPLASMA PNEUMONIA 01/01/2008    Dierdre HighmanAudra F Majorie Santee, PT, DPT 12/23/18    4:37 PM    [facility] [address] [place], [place], [address] Phone: [phone]   Fax:  [phone]  Name: Charles Marquez MRN: [id number] Date of Birth: 16-May-1956

## 2018-12-23 NOTE — Patient Instructions (Signed)
[facility]   [email or web address]  [phone]  [phone] ? (not sure if this number is correct)

## 2018-12-23 NOTE — Patient Instructions (Addendum)
Access Code: [email or web address]  URL: [email or web address]  Date: 12/23/2018  Prepared by: Bufford Lope   Exercises  Romberg Stance with Head Nods - 10 reps - 1x daily - 5x weekly  Romberg Stance with Head Rotation - 1 sets - 10 reps - 1x daily - 5x weekly  Romberg Stance Eyes Closed on Foam Pad - 3 sets - 30 hold - 1x daily - 5x weekly  Standing Single Leg Stance with Counter Support - 3 sets - 10 SECONDS hold - 1x daily - 3x weekly  Walking Tandem Stance - 10 reps - 4 sets - 1x daily - 3x weekly  Side Step with Agility Ladder - 3 sets - 2x daily - 5x weekly  Toe Walking with Counter Support - 10 reps - 4 sets - 1x daily - 7x weekly  Standing Terminal Knee Extension with Resistance - 8 reps - 2 sets - 5 second hold - 2x daily - 5x weekly  Tandem Stance - 4 sets - 10 seconds hold - 1x daily - 5x weekly  Heel Walking with Counter Support - 10 reps - 4 sets - 1x daily - 3x weekly

## 2018-12-25 ENCOUNTER — Ambulatory Visit (INDEPENDENT_AMBULATORY_CARE_PROVIDER_SITE_OTHER): Payer: BLUE CROSS/BLUE SHIELD | Admitting: Internal Medicine

## 2018-12-25 ENCOUNTER — Encounter: Payer: Self-pay | Admitting: Internal Medicine

## 2018-12-25 VITALS — BP 132/82 | HR 94 | Ht 71.0 in | Wt 196.6 lb

## 2018-12-25 DIAGNOSIS — I639 Cerebral infarction, unspecified: Secondary | ICD-10-CM | POA: Diagnosis not present

## 2018-12-25 DIAGNOSIS — Z4509 Encounter for adjustment and management of other cardiac device: Secondary | ICD-10-CM | POA: Diagnosis not present

## 2018-12-25 NOTE — Progress Notes (Signed)
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 male who presents today for routine electrophysiology followup.   He has had multiple hospitalizations and decline over the past year due to VP shunt issues.  He has been monitored remotely when at home, with no afib detected. Today, he denies symptoms of palpitations, chest pain, shortness of breath,  lower extremity edema, dizziness, presyncope, or syncope.  The patient is otherwise without complaint today.   Past Medical History:  Diagnosis Date  . Anxiety   . Hypercholesteremia   . Stroke (HCC)    tia's  . TIA (transient ischemic attack)    09.15   Past Surgical History:  Procedure Laterality Date  . Fractured arm Left 12  . HERNIA REPAIR Right 3/12  . LAPAROSCOPIC REVISION VENTRICULAR-PERITONEAL (V-P) SHUNT N/A 08/26/2018   Procedure: LAPAROSCOPIC INSERTION VENTRICULAR-PERITONEAL (V-P) SHUNT;  Surgeon: Julio Sicks, MD;  [facility];  Service: Neurosurgery;  Laterality: N/A;  . LOOP RECORDER INSERTION N/A 04/10/2017   Procedure: Loop Recorder Insertion;  Surgeon: Hillis Range, MD;  [facility]: [facility];  Service: Cardiovascular;  Laterality: N/A;  . SHUNT REMOVAL Right 03/13/2016   Procedure: SHUNT REMOVAL;  Surgeon: Julio Sicks, MD;  [facility]: [facility];  Service: Neurosurgery;  Laterality: Right;  . SHUNT REMOVAL Right 08/09/2018   Procedure: SHUNT REMOVAL With Placement of Ventricular Catheter;  Surgeon: Lisbeth Renshaw, MD;  Location: [facility] OR;  Service: Neurosurgery;  Laterality: Right;  . SHUNT REVISION Right 08/05/2018   Procedure: SHUNT REVISION;  Surgeon: Julio Sicks, MD;  Location: [facility] OR;  Service: Neurosurgery;  Laterality: Right;  . SHUNT REVISION Left 12/06/2018   Procedure: Shunt Revision - left;  Surgeon: Julio Sicks, MD;  Location: [facility] OR;  Service: Neurosurgery;  Laterality: Left;  Shunt Revision - left  . SHUNT REVISION N/A 12/09/2018   Procedure: SHUNT REVISION;   Surgeon: Julio Sicks, MD;  Location: [facility] OR;  Service: Neurosurgery;  Laterality: N/A;  . SHUNT REVISION VENTRICULAR-PERITONEAL Left 08/26/2018   Procedure: SHUNT REVISION VENTRICULAR-PERITONEAL;  Surgeon: Julio Sicks, MD;  [facility]: [facility];  Service: Neurosurgery;  Laterality: Left;  . SHUNT REVISION VENTRICULAR-PERITONEAL Left 09/02/2018   Procedure: Left Occipital VP shunt revision;  Surgeon: Julio Sicks, MD;  Location: [facility] OR;  Service: Neurosurgery;  Laterality: Left;  [name] VASECTOMY  10/02/1997  . 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 VENTRICULOPERITONEAL SHUNT Right 07/22/2018   Procedure: Shunt Placment right occipital;  Surgeon: Julio Sicks, MD;  Location: [facility] OR;  Service: Neurosurgery;  Laterality: Right;  . VENTRICULOPERITONEAL SHUNT Left 08/26/2018   Procedure: LEFT SIDED VENTRICULAR-PERITONEAL SHUNT;  Surgeon: Julio Sicks, MD;  Location: [facility] OR;  Service: Neurosurgery;  Laterality: Left;  [name] VENTRICULOSTOMY Right 08/09/2018   Procedure: VENTRICULOSTOMY;  Surgeon: Lisbeth Renshaw, MD;  Location: [facility] OR;  Service: Neurosurgery;  Laterality: Right;    ROS- all systems are reviewed and negatives except as per HPI above  Current Outpatient Medications  Medication Sig Dispense Refill  . acetaminophen (TYLENOL) 325 MG tablet Take 2 tablets (650 mg total) by mouth every 4 (four) hours as needed for mild pain (temp > 100.5).    [name] atorvastatin (LIPITOR) 40 MG tablet Take 1 tablet (40 mg total) by  mouth daily. 90 tablet 3  . Cetirizine HCl (ZYRTEC PO) Take 1 tablet by mouth daily as needed (seasonal allergies).     . clopidogrel (PLAVIX) 75 MG tablet Take 75 mg by mouth daily.    . famotidine (PEPCID) 20 MG tablet Take 1 tablet (20 mg total) by mouth 2 (two) times daily. 60 tablet 0  .  hydroxypropyl methylcellulose / hypromellose (ISOPTO TEARS / GONIOVISC) 2.5 % ophthalmic solution Place 1 drop into both eyes as needed for dry eyes.    . Multiple Vitamins-Minerals (OCUVITE EYE HEALTH FORMULA) CAPS Take 1 capsule by mouth daily.    . sodium chloride (OCEAN) 0.65 % SOLN nasal spray Place 1 spray into both nostrils as needed for congestion.    . traMADol (ULTRAM) 50 MG tablet Take 1 tablet (50 mg total) by mouth at bedtime as needed (pain). 30 tablet 0   No current facility-administered medications for this visit.     Physical Exam: Vitals:   12/25/18 1550  BP: 132/82  Pulse: 94  SpO2: 98%  Weight: 196 lb 9.6 oz (89.2 kg)  Height: 5\' 11"  (1.803 m)    GEN- The patient is well appearing, alert and oriented x 3 today.   Head- normocephalic, atraumatic Eyes-  Sclera clear, conjunctiva pink Ears- hearing intact Oropharynx- clear Lungs- Clear to ausculation bilaterally, normal work of breathing Heart- Regular rate and rhythm, no murmurs, rubs or gallops, PMI not laterally displaced GI- soft, NT, ND, + BS Extremities- no clubbing, cyanosis, or edema Flat affect, + mild tremor  Wt Readings from Last 3 Encounters:  12/25/18 196 lb 9.6 oz (89.2 kg)  12/09/18 195 lb (88.5 kg)  11/05/18 185 lb (83.9 kg)    Assessment and Plan:  1. Cryptogenic stroke He wishes to un enroll in remote monitoring due to costs.  I had a long discussion with the patient and his wife today.  They are aware that my recommendation is that they continue remote monitoring.  As they are clear that they do not wish to do this, I will un enroll in remote monitoring at their request.  They are aware that we will not be made aware of any arrhythmias from the device without a manual interrogation.  I also offered device removal today which was also declined.  He will return in 6 months for manual interrogation of his ILR.  No remote monitoring is planned in the interim.   Hillis RangeJames Lynna Zamorano MD,  [facility] 12/25/2018 4:13 PM

## 2018-12-25 NOTE — Patient Instructions (Addendum)

## 2018-12-25 NOTE — Therapy (Signed)
[facility] [address] [place], [place], [address] Phone: [phone]   Fax:  [phone]  Speech Language Pathology Treatment  Patient Details  Name: Charles Marquez MRN: [id number] Date of Birth: 09-19-1956 Referring Provider (SLP): Dr. Posey Pronto   Encounter Date: 12/23/2018  End of Session - 12/23/18 1420   Visit Number  20    Number of Visits  25    Date for SLP Re-Evaluation  01/17/19    Authorization Type  [employer] 90 visits for all disciplines     SLP Start Time  1615    SLP Stop Time   1700    SLP Time Calculation (min)  45 min       Past Medical History:  Diagnosis Date  . Anxiety   . Hypercholesteremia   . Stroke ([place])    tia's  . TIA (transient ischemic attack)    09.15    Past Surgical History:  Procedure Laterality Date  . Fractured arm Left 12  . HERNIA REPAIR Right 3/12  . LAPAROSCOPIC REVISION VENTRICULAR-PERITONEAL (V-P) SHUNT N/A 08/26/2018   Procedure: LAPAROSCOPIC INSERTION VENTRICULAR-PERITONEAL (V-P) SHUNT;  Surgeon: Earnie Larsson, MD;  Location: [place];  Service: Neurosurgery;  Laterality: N/A;  . LOOP RECORDER INSERTION N/A 04/10/2017   Procedure: Loop Recorder Insertion;  Surgeon: Thompson Grayer, MD;  Location: [place] CV LAB;  Service: Cardiovascular;  Laterality: N/A;  . SHUNT REMOVAL Right 03/13/2016   Procedure: SHUNT REMOVAL;  Surgeon: Earnie Larsson, MD;  [facility]: [facility];  Service: Neurosurgery;  Laterality: Right;  . SHUNT REMOVAL Right 08/09/2018   Procedure: SHUNT REMOVAL With Placement of Ventricular Catheter;  Surgeon: Consuella Lose, MD;  Location: [place];  Service: Neurosurgery;  Laterality: Right;  . SHUNT REVISION Right 08/05/2018   Procedure: SHUNT REVISION;  Surgeon: Earnie Larsson, MD;  Location: [place];  Service: Neurosurgery;  Laterality: Right;  . SHUNT REVISION Left 12/06/2018   Procedure: Shunt Revision - left;  Surgeon: Earnie Larsson, MD;  Location: [place];  Service:  Neurosurgery;  Laterality: Left;  Shunt Revision - left  . SHUNT REVISION N/A 12/09/2018   Procedure: SHUNT REVISION;  Surgeon: Earnie Larsson, MD;  Location: [place];  Service: Neurosurgery;  Laterality: N/A;  . SHUNT REVISION VENTRICULAR-PERITONEAL Left 08/26/2018   Procedure: SHUNT REVISION VENTRICULAR-PERITONEAL;  Surgeon: Earnie Larsson, MD;  Location: [place];  Service: Neurosurgery;  Laterality: Left;  . SHUNT REVISION VENTRICULAR-PERITONEAL Left 09/02/2018   Procedure: Left Occipital VP shunt revision;  Surgeon: Earnie Larsson, MD;  Location: [place];  Service: Neurosurgery;  Laterality: Left;  [name] VASECTOMY  10/02/1997  . VENTRICULOPERITONEAL SHUNT Right 12/18/2014   Procedure: Shunt Placment - right occipital VP shunt ;  Surgeon: Charlie Pitter, MD;  Location: [place] NEURO ORS;  Service: Neurosurgery;  Laterality: Right;  Shunt Placment - right occipital VP shunt   . VENTRICULOPERITONEAL SHUNT Right 07/22/2018   Procedure: Shunt Placment right occipital;  Surgeon: Earnie Larsson, MD;  Location: [facility];  Service: Neurosurgery;  Laterality: Right;  . VENTRICULOPERITONEAL SHUNT Left 08/26/2018   Procedure: LEFT SIDED VENTRICULAR-PERITONEAL SHUNT;  Surgeon: Earnie Larsson, MD;  Location: [place];  Service: Neurosurgery;  Laterality: Left;  [name] VENTRICULOSTOMY Right 08/09/2018   Procedure: VENTRICULOSTOMY;  Surgeon: Consuella Lose, MD;  Location: [place];  Service: Neurosurgery;  Laterality: Right;    There were no vitals filed for this visit.  Subjective Assessment - 12/23/18 1420    Subjective  Pt/wife report he has been working  on TalkPath exercises daily    Patient is accompained by:  Family member   wife Charles Marquez and sister Charles Marquez   Currently in Pain?  No/denies            ADULT SLP TREATMENT - 12/23/18 1420     General Information   Behavior/Cognition  Alert;Cooperative;Pleasant mood      Treatment Provided   Treatment provided  Cognitive-Linquistic      Pain Assessment   Pain Assessment  No/denies pain       Cognitive-Linquistic Treatment   Treatment focused on  Cognition;Aphasia    Skilled Treatment  SLP discussed goals and reassessment findings from previous session. Wife stated she hopes pt can improve his sense of wellbeing and reduce fear/anxiety. SLP provided name of local psychotherapist. Pt also has appointment scheduled with Dr. Jefm Miles in March. Pt used daily planner to recall his weekend activities, with rare min question cues to tell specific details. In 10 minutes mod complex conversation re: current events, pt required occasional min question cues to clarify vague language. Pt did not recall a major current event which he discussed with his wife (occured between his 2 shunt revisions), and felt he was just hearing this information for the first time. SLP encouraged pt to read about this at home and email summary of what he reads to SLP to work on his reading comprehension/writing. In subsequent mod complex conversation, pt required occasional min-mod A written cues and question cues for awareness of and to clarify vague language.       Assessment / Recommendations / Plan   Plan  Continue with current plan of care      Progression Toward Goals   Progression toward goals  Progressing toward goals         SLP Short Term Goals - 12/20/18 0805      SLP SHORT TERM GOAL #1   Title  Pt will complete standardized assessment of cognition.    Status  Achieved      SLP SHORT TERM GOAL #2   Title  Pt will ID object/picture to simple description/feature/function f:4 with occasional min A over 3 sessions     Status  Partially Met      SLP SHORT TERM GOAL #3   Title  Pt will demo auditory comprehension of 5 minutes simple-mod complex conversation by responding appropriately or asking questions over 2 sessions    Status  Achieved      SLP SHORT TERM GOAL #4   Title  Pt will name basic objects/pictures 7/10 correct with occasional mod A over 3 sessions     Status  Partially Met      SLP  SHORT TERM GOAL #5   Title  Pt will demo sustained attention for 10 minutes in simple cognitive-linguistic task x3 sessions.    Status  Achieved      SLP SHORT TERM GOAL #6   Title  Pt will have a memory system to assist in managing appointments, schedules, medical and therapy information and bring with him to 3 therapy sessions     Status  Achieved       SLP Long Term Goals - 12/23/18 1420      SLP LONG TERM GOAL #1   Title  Pt will write or name 5 words in personally relevant category with occasional min A over 4 sessions     Status  Partially Met      SLP LONG TERM GOAL #2   Title  Pt will demo error awareness by attempting correction or by nonverbal response to errors 75% of the time over 3 sessions    Status  Achieved      SLP LONG TERM GOAL #3   Title  Pt will participate functionally in 10 minutes simple-mod complex conversation with conversational supports for aphasia over 3 sessions.    Status  Achieved      SLP LONG TERM GOAL #4   Title  Pt will demo selective attention in min noisy environment for 10 minutes in a simple-mod complex cognitive linguistic task over three sessions    Status  Deferred      SLP LONG TERM GOAL #5   Title  Pt will utilize memory compensation system to recall details/manage appointments, schedules, medical and therapy information with rare min A over 4 sessions      Baseline  11/28/18, 12/23/18    Time  3    Period  Weeks    Status  On-going      SLP LONG TERM GOAL #6   Title  Pt will demo WFL reading comprehension for high interest materials (texts, emails, news articles)by answering questions,summarizing material, or composing responses x3 sessions.    Time  3    Period  Weeks    Status  On-going      SLP LONG TERM GOAL #7   Title  Pt will generate appropriate written or typed responses to emails and text messages >90% accuracy (compensations allowed) x 3 sessions    Time  3    Period  Weeks    Status  On-going      SLP LONG TERM GOAL  #8   Title  Pt will participate functionally in 10 minutes mod complex conversation with conversational supports for aphasia over 3 sessions.    Time  3    Period  Weeks    Status  On-going       Plan - 12/23/18 1420   Clinical Impression Statement  Charles Marquez continues to present with aphasia, attention and memory impairments. Simple conversation today is functional. Hesitations, wordfinding difficulties and circumlocution present in mod complex conversation. Anxiety continues to play a role in pt's performance; suspect this hinders/slows his processing and response time. Continue skilled ST to maximize communication and cognition for wants/needs, safety, independence and QOL.    Speech Therapy Frequency  2x / week    Duration  --   25 visits   Treatment/Interventions  Cognitive reorganization;Multimodal communcation approach;Environmental controls;Compensatory strategies;Language facilitation;Compensatory techniques;Cueing hierarchy;Internal/external aids;Functional tasks;SLP instruction and feedback;Patient/family education    Potential to Achieve Goals  Good       Patient will benefit from skilled therapeutic intervention in order to improve the following deficits and impairments:   Aphasia  Cognitive communication deficit    Problem List Patient Active Problem List   Diagnosis Date Noted  . Visual disturbance   . Slow transit constipation   . Hypoalbuminemia due to protein-calorie malnutrition ([place])   . Acute blood loss anemia   . S/P VP shunt   . Hydrocephalus ([place]) 08/30/2018  . Dyslipidemia   . History of CVA (cerebrovascular accident)   . Benign essential HTN   . Tachycardia   . Leukocytosis   . Hyponatremia   . Hypokalemia   . 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 Infection of ventricular shunt ([place]) 08/09/2018  . Bacterial meningitis 08/09/2018  . TIA (transient ischemic attack) 01/29/2017  . Acute encephalopathy   . Shunt malfunction 03/13/2016  .  Small  vessel disease, cerebrovascular 01/25/2015  . 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 Hyperlipidemia 10/20/2014  . Degenerative disc disease, lumbar 04/15/2013  . Routine general medical examination at a health care facility 07/23/2012  . DISTURBANCE OF SKIN SENSATION 10/05/2008  . HYPERLIPIDEMIA 01/01/2008  . MYCOPLASMA PNEUMONIA 01/01/2008   Deneise Lever, [place], [place] 12/25/2018, 8:52 AM  [facility] [address] [place], [place], [address] Phone: [phone]   Fax:  [phone]   Name: Charles Marquez MRN: [id number] Date of Birth: Mar 27, 1956

## 2018-12-26 ENCOUNTER — Ambulatory Visit: Payer: BLUE CROSS/BLUE SHIELD | Admitting: Speech Pathology

## 2018-12-26 DIAGNOSIS — R41841 Cognitive communication deficit: Secondary | ICD-10-CM

## 2018-12-26 DIAGNOSIS — R4701 Aphasia: Secondary | ICD-10-CM

## 2018-12-26 NOTE — Patient Instructions (Signed)
   Vocational rehabilitation: [email or web address]   Homework: Read a TalkPath article and email a response to [facility]. [name] can help give you question "prompts"

## 2018-12-26 NOTE — Therapy (Signed)
[facility] [address] [place], [place], [address] Phone: [phone]   Fax:  [phone]  Speech Language Pathology Treatment  Patient Details  Name: Charles Marquez MRN: [id number] Date of Birth: [birth year] Referring Provider (SLP): Dr. Posey Pronto   Encounter Date: 12/26/2018  End of Session - 12/26/18 1730    Visit Number  21    Number of Visits  25    Date for SLP Re-Evaluation  01/17/19    Authorization Type  BCBS 90 visits for all disciplines     SLP Start Time  1619    SLP Stop Time   1702    SLP Time Calculation (min)  43 min    Activity Tolerance  Patient tolerated treatment well       Past Medical History:  Diagnosis Date  . Anxiety   . Hypercholesteremia   . Stroke ([place])    tia's  . TIA (transient ischemic attack)    09.15    Past Surgical History:  Procedure Laterality Date  . Fractured arm Left 12  . HERNIA REPAIR Right 3/12  . LAPAROSCOPIC REVISION VENTRICULAR-PERITONEAL (V-P) SHUNT N/A 08/26/2018   Procedure: LAPAROSCOPIC INSERTION VENTRICULAR-PERITONEAL (V-P) SHUNT;  Surgeon: Earnie Larsson, MD;  Location: [place] [facility];  Service: Neurosurgery;  Laterality: N/A;  . LOOP RECORDER INSERTION N/A 04/10/2017   Procedure: Loop Recorder Insertion;  Surgeon: Thompson Grayer, MD;  Location: [facility];  Service: Cardiovascular;  Laterality: N/A;  . SHUNT REMOVAL Right 03/13/2016   Procedure: SHUNT REMOVAL;  Surgeon: Earnie Larsson, MD;  [facility];  Service: Neurosurgery;  Laterality: Right;  . SHUNT REMOVAL Right 08/09/2018   Procedure: SHUNT REMOVAL With Placement of Ventricular Catheter;  Surgeon: Consuella Lose, MD;  Location: [facility];  Service: Neurosurgery;  Laterality: Right;  . SHUNT REVISION Right 08/05/2018   Procedure: SHUNT REVISION;  Surgeon: Earnie Larsson, MD;  Location: [facility];  Service: Neurosurgery;  Laterality: Right;  . SHUNT REVISION Left 12/06/2018   Procedure: Shunt Revision -  left;  Surgeon: Earnie Larsson, MD;  Location: [facility];  Service: Neurosurgery;  Laterality: Left;  Shunt Revision - left  . SHUNT REVISION N/A 12/09/2018   Procedure: SHUNT REVISION;  Surgeon: Earnie Larsson, MD;  Location: [facility];  Service: Neurosurgery;  Laterality: N/A;  . SHUNT REVISION VENTRICULAR-PERITONEAL Left 08/26/2018   Procedure: SHUNT REVISION VENTRICULAR-PERITONEAL;  Surgeon: Earnie Larsson, MD;  Location: [facility];  Service: Neurosurgery;  Laterality: Left;  . SHUNT REVISION VENTRICULAR-PERITONEAL Left 09/02/2018   Procedure: Left Occipital VP shunt revision;  Surgeon: Earnie Larsson, MD;  Location: [facility];  Service: Neurosurgery;  Laterality: Left;  [name] VASECTOMY  10/02/1997  . VENTRICULOPERITONEAL SHUNT Right 12/18/2014   Procedure: Shunt Placment - right occipital VP shunt ;  Surgeon: Charlie Pitter, MD;  Location: [facility];  Service: Neurosurgery;  Laterality: Right;  Shunt Placment - right occipital VP shunt   . VENTRICULOPERITONEAL SHUNT Right 07/22/2018   Procedure: Shunt Placment right occipital;  Surgeon: Earnie Larsson, MD;  Location: [facility];  Service: Neurosurgery;  Laterality: Right;  . VENTRICULOPERITONEAL SHUNT Left 08/26/2018   Procedure: LEFT SIDED VENTRICULAR-PERITONEAL SHUNT;  Surgeon: Earnie Larsson, MD;  Location: [facility];  Service: Neurosurgery;  Laterality: Left;  [name] VENTRICULOSTOMY Right 08/09/2018   Procedure: VENTRICULOSTOMY;  Surgeon: Consuella Lose, MD;  Location: [facility];  Service: Neurosurgery;  Laterality: Right;    There were no vitals filed for this visit.  ADULT SLP TREATMENT - 12/26/18 1619      General Information   Behavior/Cognition  Alert;Cooperative;Pleasant mood      Treatment Provided   Treatment provided  Cognitive-Linquistic      Pain Assessment   Pain Assessment  No/denies pain      Cognitive-Linquistic Treatment   Treatment focused on  Aphasia;Cognition    Skilled Treatment  SLP targeted pt's reading comprehension and written expression in  emails. Pt generated reply to SLP's email at home re: news event. Information content and message were clear. Pt required occasional mod A for error correction with 1/5 sentences which was lengthy, with spelling and syntax errors and redundancy. Pt ID'd 2/2 instances of repetition of words or information with occasional question cues. ID'd 2/2 spelling/tense errors with min ? cues. Reading comprehension: Pt read article on [employer] with rare min A cues for re-reading to correct 2 errors. For comprehension questions, pt had difficulty answering 2/3 questions, due to recall and language deficits. Pt able to answer 2/3 questions correctly with min-mod cues for re-reading text. Mod-max A for remaining comprehension question.        Assessment / Recommendations / Plan   Plan  Continue with current plan of care      Progression Toward Goals   Progression toward goals  Progressing toward goals       SLP Education - 12/26/18 1732    Education Details  vocational rehabilitation    Person(s) Educated  Patient;Spouse    Methods  Explanation;Handout    Comprehension  Verbalized understanding       SLP Short Term Goals - 12/20/18 0805      SLP SHORT TERM GOAL #1   Title  Pt will complete standardized assessment of cognition.    Status  Achieved      SLP SHORT TERM GOAL #2   Title  Pt will ID object/picture to simple description/feature/function f:4 with occasional min A over 3 sessions     Status  Partially Met      SLP SHORT TERM GOAL #3   Title  Pt will demo auditory comprehension of 5 minutes simple-mod complex conversation by responding appropriately or asking questions over 2 sessions    Status  Achieved      SLP SHORT TERM GOAL #4   Title  Pt will name basic objects/pictures 7/10 correct with occasional mod A over 3 sessions     Status  Partially Met      SLP SHORT TERM GOAL #5   Title  Pt will demo sustained attention for 10 minutes in simple cognitive-linguistic task x3 sessions.     Status  Achieved      SLP SHORT TERM GOAL #6   Title  Pt will have a memory system to assist in managing appointments, schedules, medical and therapy information and bring with him to 3 therapy sessions     Status  Achieved       SLP Long Term Goals - 12/26/18 1718      SLP LONG TERM GOAL #5   Title  Pt will utilize memory compensation system to recall details/manage appointments, schedules, medical and therapy information with rare min A over 4 sessions      Baseline  11/28/18, 12/23/18    Time  3    Period  Weeks    Status  On-going      SLP LONG TERM GOAL #6   Title  Pt will demo [facility] reading comprehension for high interest  materials (texts, emails, news articles)by answering questions,summarizing material, or composing responses x3 sessions.    Baseline  12/26/18    Time  3    Period  Weeks    Status  On-going      SLP LONG TERM GOAL #7   Title  Pt will generate appropriate written or typed responses to emails and text messages >90% accuracy (compensations allowed) x 3 sessions    Time  3    Period  Weeks    Status  On-going      SLP LONG TERM GOAL #8   Title  Pt will participate functionally in 10 minutes mod complex conversation with conversational supports for aphasia over 3 sessions.    Time  3    Period  Weeks    Status  On-going       Plan - 12/26/18 1730    Clinical Impression Statement  Mr. Maertens continues to present with aphasia, attention and memory impairments. Simple conversation today is functional. Hesitations, wordfinding difficulties and circumlocution present in mod complex conversation. Written expression improving; pt responses overall more concise and with greater content than in previous sessions. Anxiety continues to play a role in pt's performance; suspect this hinders/slows his processing and response time. Continue skilled ST to maximize communication and cognition for wants/needs, safety, independence and QOL.    Speech Therapy Frequency  2x /  week    Duration  --   25 visits   Treatment/Interventions  Cognitive reorganization;Multimodal communcation approach;Environmental controls;Compensatory strategies;Language facilitation;Compensatory techniques;Cueing hierarchy;Internal/external aids;Functional tasks;SLP instruction and feedback;Patient/family education    Potential to Achieve Goals  Good    Potential Considerations  Severity of impairments       Patient will benefit from skilled therapeutic intervention in order to improve the following deficits and impairments:   Aphasia  Cognitive communication deficit    Problem List Patient Active Problem List   Diagnosis Date Noted  . Visual disturbance   . Slow transit constipation   . Hypoalbuminemia due to protein-calorie malnutrition ([place])   . Acute blood loss anemia   . S/P VP shunt   . Hydrocephalus ([place]) 08/30/2018  . Dyslipidemia   . History of CVA (cerebrovascular accident)   . Benign essential HTN   . Tachycardia   . Leukocytosis   . Hyponatremia   . Hypokalemia   . 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 Infection of ventricular shunt ([place]) 08/09/2018  . Bacterial meningitis 08/09/2018  . TIA (transient ischemic attack) 01/29/2017  . Acute encephalopathy   . Shunt malfunction 03/13/2016  . 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 Hyperlipidemia 10/20/2014  . Degenerative disc disease, lumbar 04/15/2013  . Routine general medical examination at a health care facility 07/23/2012  . DISTURBANCE OF SKIN SENSATION 10/05/2008  . HYPERLIPIDEMIA 01/01/2008  . MYCOPLASMA PNEUMONIA 01/01/2008   Deneise Lever, [place], [place] 12/26/2018, 5:33 PM  [facility] [address] [place], [place], [address] Phone: [phone]   Fax:  [phone]   Name: Mohmed Farver MRN: [id number] Date of Birth:  1956-04-11

## 2018-12-30 ENCOUNTER — Encounter: Payer: Self-pay | Admitting: Physical Therapy

## 2018-12-30 ENCOUNTER — Ambulatory Visit: Payer: BLUE CROSS/BLUE SHIELD | Admitting: Speech Pathology

## 2018-12-30 ENCOUNTER — Ambulatory Visit: Payer: BLUE CROSS/BLUE SHIELD | Admitting: Physical Therapy

## 2018-12-30 DIAGNOSIS — M6281 Muscle weakness (generalized): Secondary | ICD-10-CM

## 2018-12-30 DIAGNOSIS — R4701 Aphasia: Secondary | ICD-10-CM

## 2018-12-30 DIAGNOSIS — R41841 Cognitive communication deficit: Secondary | ICD-10-CM

## 2018-12-30 DIAGNOSIS — R2681 Unsteadiness on feet: Secondary | ICD-10-CM

## 2018-12-30 DIAGNOSIS — R29818 Other symptoms and signs involving the nervous system: Secondary | ICD-10-CM

## 2018-12-30 DIAGNOSIS — R2689 Other abnormalities of gait and mobility: Secondary | ICD-10-CM

## 2018-12-30 NOTE — Therapy (Signed)
[facility] [address] [place], [place], [address] Phone: [phone]   Fax:  [phone]  Physical Therapy Treatment  Patient Details  Name: Charles Marquez MRN: [id number] Date of Birth: Aug 19, 1956 Referring Provider (PT): Kathaleen Maser. Pool, MD   Encounter Date: 12/30/2018  PT End of Session - 12/30/18 1629    Visit Number  3    Number of Visits  9    Date for PT Re-Evaluation  02/18/19    Authorization Type  BCBS OTHER     Authorization Time Period  PT/OT/ST/Hydro therapy covered x 90 days    PT Start Time  1534    PT Stop Time  1616    PT Time Calculation (min)  42 min    Activity Tolerance  Patient tolerated treatment well    Behavior During Therapy  WFL for tasks assessed/performed       Past Medical History:  Diagnosis Date  . Anxiety   . Hypercholesteremia   . Stroke ([facility])    tia's  . TIA (transient ischemic attack)    09.15    Past Surgical History:  Procedure Laterality Date  . Fractured arm Left 12  . HERNIA REPAIR Right 3/12  . LAPAROSCOPIC REVISION VENTRICULAR-PERITONEAL (V-P) SHUNT N/A 08/26/2018   Procedure: LAPAROSCOPIC INSERTION VENTRICULAR-PERITONEAL (V-P) SHUNT;  Surgeon: Julio Sicks, MD;  [facility];  Service: Neurosurgery;  Laterality: N/A;  . LOOP RECORDER INSERTION N/A 04/10/2017   Procedure: Loop Recorder Insertion;  Surgeon: Hillis Range, MD;  [facility];  Service: Cardiovascular;  Laterality: N/A;  . SHUNT REMOVAL Right 03/13/2016   Procedure: SHUNT REMOVAL;  Surgeon: Julio Sicks, MD;  [facility];  Service: Neurosurgery;  Laterality: Right;  . SHUNT REMOVAL Right 08/09/2018   Procedure: SHUNT REMOVAL With Placement of Ventricular Catheter;  Surgeon: Lisbeth Renshaw, MD;  Location: [facility];  Service: Neurosurgery;  Laterality: Right;  . SHUNT REVISION Right 08/05/2018   Procedure: SHUNT REVISION;  Surgeon: Julio Sicks, MD;  Location: [facility] OR;  Service:  Neurosurgery;  Laterality: Right;  . SHUNT REVISION Left 12/06/2018   Procedure: Shunt Revision - left;  Surgeon: Julio Sicks, MD;  Location: [facility];  Service: Neurosurgery;  Laterality: Left;  Shunt Revision - left  . SHUNT REVISION N/A 12/09/2018   Procedure: SHUNT REVISION;  Surgeon: Julio Sicks, MD;  Location: [facility] OR;  Service: Neurosurgery;  Laterality: N/A;  . SHUNT REVISION VENTRICULAR-PERITONEAL Left 08/26/2018   Procedure: SHUNT REVISION VENTRICULAR-PERITONEAL;  Surgeon: Julio Sicks, MD;  [facility];  Service: Neurosurgery;  Laterality: Left;  . SHUNT REVISION VENTRICULAR-PERITONEAL Left 09/02/2018   Procedure: Left Occipital VP shunt revision;  Surgeon: Julio Sicks, MD;  Location: [facility] OR;  Service: Neurosurgery;  Laterality: Left;  [name] VASECTOMY  10/02/1997  . 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 VENTRICULOPERITONEAL SHUNT Right 07/22/2018   Procedure: Shunt Placment right occipital;  Surgeon: Julio Sicks, MD;  Location: [facility];  Service: Neurosurgery;  Laterality: Right;  . VENTRICULOPERITONEAL SHUNT Left 08/26/2018   Procedure: LEFT SIDED VENTRICULAR-PERITONEAL SHUNT;  Surgeon: Julio Sicks, MD;  Location: [facility] OR;  Service: Neurosurgery;  Laterality: Left;  [name] VENTRICULOSTOMY Right 08/09/2018   Procedure: VENTRICULOSTOMY;  Surgeon: Lisbeth Renshaw, MD;  Location[facility] OR;  Service:  Neurosurgery;  Laterality: Right;    There were no vitals filed for this visit.  Subjective Assessment - 12/30/18 1537    Subjective  Was able to visit grandson in the hospital over the weekend.  Pt is more physically active; has been outside a lot with warmer weather.      Patient is accompained by:  Family member    Pertinent History   S/p VP shunt, Hydrocephalus, CVA with loop recorder insertion 04/10/2017  as well as R occipital VP shunt placement 2016, Infection of ventricular shunt, shunt malfunction, HLD, anxiety, TIA, Hypercholesteremia, acute blood loss anemia, HTN, Tachycardia, Leukocytosis, Hyponatremia, Bacterial Encephalitis, Bacterial Meningitis, Dyslipidemia, DDD lumbar spine, and Mycoplasma Pneumonia.     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  No/denies                            Balance Exercises - 12/30/18 1625      Balance Exercises: Standing   Tandem Stance  Eyes open;Intermittent upper extremity support;2 reps;10 secs    SLS  Eyes open;Solid surface;Intermittent upper extremity support;3 reps;10 secs    Gait with Head Turns  Forward;Other reps (comment)   6 reps, outside on pavement, scanning for ball   Tandem Gait  Forward;Intermittent upper extremity support;4 reps    Other Standing Exercises  Also performed ambulation outside on pavement with repeated head nods x 200' + repeated head turns x 200' while moving ball up/down and side to side and following ball with eyes and head movement; supervision due to veering to R side      OTAGO PROGRAM   Heel Walking  No support    Toe Walk  No support        PT Education - 12/30/18 1629    Education Details  updated HEP, gait with scanning    Person(s) Educated  Patient;Spouse    Methods  Explanation;Demonstration;Handout    Comprehension  Verbalized understanding;Returned demonstration       PT Short Term Goals - 12/20/18 0911      PT SHORT TERM GOAL #1   Title  = LTG        PT Long Term Goals - 12/20/18 0911      PT LONG TERM GOAL #1   Title  Pt will be independent and compliant with performing PROGRESSED HEP to improve balance, strength, and functional mobility.    Time  8    Period  Weeks    Status  New    Target Date  02/18/19       PT LONG TERM GOAL #2   Title  Pt maintain gait velocity of >4.0 ft/sec but demonstrate decreased veering and running into objects on R due to improved awareness of field cut    Time  8    Period  Weeks    Status  New    Target Date  02/18/19      PT LONG TERM GOAL #3   Title  Pt will demonstrate 12 steps with no HR's at mod I level with improved control for forward momentum when descending    Time  8    Period  Weeks    Status  New    Target Date  02/18/19      PT LONG TERM  GOAL #4   Title  Pt will improve FGA score to >/=26/30 indicating improvement in functional mobility.     Baseline  24/30    Time  8    Period  Weeks    Status  New    Target Date  02/18/19      PT LONG TERM GOAL #5   Title  Pt will demonstrate improved ability to perform dual tasking as indicated by <20% difference between TUG and COG TUG    Baseline  22% difference (9.03 TUG, 11.03 COG TUG)    Time  8    Period  Weeks    Status  New    Target Date  02/18/19            Plan - 12/30/18 1630    Clinical Impression Statement  Continued to review and revise HEP for LE strengthening and standing balance.  Therapist continued to focus on increasing attention to proprioceptive input from feet and LE during balance activities and activation of proximal hip muscles to improve stability during narrow BOS and SLS.  No strain on abdomen.  Also performed gait with visual scanning outside to improve attention to environment on R due to hemianopsia; pt demonstrated increased veering to R during visual scanning.  Will continue to address and progress towards LTG.    Rehab Potential  Good    PT Frequency  1x / week    PT Duration  8 weeks    PT Treatment/Interventions  ADLs/Self Care Home Management;Therapeutic activities;Therapeutic exercise;Balance training;Neuromuscular re-education;Visual/perceptual remediation/compensation;Patient/family education;Stair training;Gait training;Functional mobility training;DME  Instruction;Cognitive remediation    PT Next Visit Plan  LIFTING RESTRICTION OF 10LB; AVOID STRAIN ON ABDOMINAL INCISION.  Gait with scanning, especially to R.  Treadmill working on step length and foot clearance then carry over to level ground.  Continue to work on balance with eyes closed.  activities with dual tasking.  Floor ladder.  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  high level balance and strengthening in quadruped/ tall kneeling.    PT Home Exercise Plan  [email or web address]     Consulted and Agree with Plan of Care  Patient;Family member/caregiver    Family Member Consulted  wife       Patient will benefit from skilled therapeutic intervention in order to improve the following deficits and impairments:  Abnormal gait, Decreased safety awareness, Impaired vision/preception, Decreased balance, Decreased cognition, Decreased strength, Difficulty walking  Visit Diagnosis: Other symptoms and signs involving the nervous system  Muscle weakness (generalized)  Unsteadiness on feet  Other abnormalities of gait and mobility     Problem List Patient Active Problem List   Diagnosis Date Noted  . Visual disturbance   . Slow transit constipation   . Hypoalbuminemia due to protein-calorie malnutrition ([facility])   . Acute blood loss anemia   . S/P VP shunt   . Hydrocephalus ([facility]) 08/30/2018  . Dyslipidemia   . History of CVA (cerebrovascular accident)   . Benign essential HTN   . Tachycardia   . Leukocytosis   . Hyponatremia   . Hypokalemia   . 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 Bacterial meningitis 08/09/2018  . TIA (transient ischemic attack) 01/29/2017  . Acute encephalopathy   . Shunt malfunction 03/13/2016  . 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 Hyperlipidemia 10/20/2014  . Degenerative disc disease, lumbar  04/15/2013  . Routine general medical examination at  a health care facility 07/23/2012  . DISTURBANCE OF SKIN SENSATION 10/05/2008  . HYPERLIPIDEMIA 01/01/2008  . MYCOPLASMA PNEUMONIA 01/01/2008   Dierdre Highman, PT, DPT 12/30/18    4:44 PM    [facility] [address] [place], [place], [address] Phone: [phone]   Fax:  [phone]  Name: Taariq Hartley MRN: [id number] Date of Birth: [birth year]

## 2018-12-30 NOTE — Patient Instructions (Signed)
Access Code: [email or web address]  URL: [email or web address]  Date: 12/30/2018  Prepared by: Bufford Lope   Exercises  Romberg Stance with Head Nods - 10 reps - 1x daily - 5x weekly  Romberg Stance with Head Rotation - 1 sets - 10 reps - 1x daily - 5x weekly  Romberg Stance Eyes Closed on Foam Pad - 3 sets - 30 hold - 1x daily - 5x weekly  Standing Single Leg Stance with Counter Support - 3 sets - 10 SECONDS hold - 1x daily - 3x weekly  Walking Tandem Stance - 10 reps - 4 sets - 1x daily - 3x weekly  Side Step with Agility Ladder - 3 sets - 2x daily - 5x weekly  Standing Terminal Knee Extension with Resistance - 8 reps - 2 sets - 5 second hold - 2x daily - 5x weekly  Side Stepping with Resistance at Feet - 10 reps - 2 sets - 3-4 SECOND hold - 2x daily - 5x weekly  Tandem Stance - 4 sets - 10 seconds hold - 1x daily - 5x weekly

## 2018-12-31 NOTE — Therapy (Signed)
[facility] [address] [place], [place], [address] Phone: [phone]   Fax:  [phone]  Speech Language Pathology Treatment  Patient Details  Name: Charles Marquez MRN: [id number] Date of Birth: 28-Feb-1956 Referring Provider (SLP): Dr. Posey Pronto   Encounter Date: 12/30/2018  End of Session - 12/31/18 1958    Visit Number  22    Number of Visits  25    Date for SLP Re-Evaluation  01/17/19    SLP Start Time  1618    SLP Stop Time   1700    SLP Time Calculation (min)  42 min    Activity Tolerance  Patient tolerated treatment well       Past Medical History:  Diagnosis Date  . Anxiety   . Hypercholesteremia   . Stroke ([place])    tia's  . TIA (transient ischemic attack)    09.15    Past Surgical History:  Procedure Laterality Date  . Fractured arm Left 12  . HERNIA REPAIR Right 3/12  . LAPAROSCOPIC REVISION VENTRICULAR-PERITONEAL (V-P) SHUNT N/A 08/26/2018   Procedure: LAPAROSCOPIC INSERTION VENTRICULAR-PERITONEAL (V-P) SHUNT;  Surgeon: Earnie Larsson, MD;  Location: [facility];  Service: Neurosurgery;  Laterality: N/A;  . LOOP RECORDER INSERTION N/A 04/10/2017   Procedure: Loop Recorder Insertion;  Surgeon: Thompson Grayer, MD;  Location: [facility];  Service: Cardiovascular;  Laterality: N/A;  . SHUNT REMOVAL Right 03/13/2016   Procedure: SHUNT REMOVAL;  Surgeon: Earnie Larsson, MD;  [facility];  Service: Neurosurgery;  Laterality: Right;  . SHUNT REMOVAL Right 08/09/2018   Procedure: SHUNT REMOVAL With Placement of Ventricular Catheter;  Surgeon: Consuella Lose, MD;  Location: [facility];  Service: Neurosurgery;  Laterality: Right;  . SHUNT REVISION Right 08/05/2018   Procedure: SHUNT REVISION;  Surgeon: Earnie Larsson, MD;  Location: [facility];  Service: Neurosurgery;  Laterality: Right;  . SHUNT REVISION Left 12/06/2018   Procedure: Shunt Revision - left;  Surgeon: Earnie Larsson, MD;  Location: [facility];  Service:  Neurosurgery;  Laterality: Left;  Shunt Revision - left  . SHUNT REVISION N/A 12/09/2018   Procedure: SHUNT REVISION;  Surgeon: Earnie Larsson, MD;  Location: [facility];  Service: Neurosurgery;  Laterality: N/A;  . SHUNT REVISION VENTRICULAR-PERITONEAL Left 08/26/2018   Procedure: SHUNT REVISION VENTRICULAR-PERITONEAL;  Surgeon: Earnie Larsson, MD;  Location: [facility];  Service: Neurosurgery;  Laterality: Left;  . SHUNT REVISION VENTRICULAR-PERITONEAL Left 09/02/2018   Procedure: Left Occipital VP shunt revision;  Surgeon: Earnie Larsson, MD;  Location: [facility];  Service: Neurosurgery;  Laterality: Left;  [name] VASECTOMY  10/02/1997  . VENTRICULOPERITONEAL SHUNT Right 12/18/2014   Procedure: Shunt Placment - right occipital VP shunt ;  Surgeon: Charlie Pitter, MD;  Location: [facility];  Service: Neurosurgery;  Laterality: Right;  Shunt Placment - right occipital VP shunt   . VENTRICULOPERITONEAL SHUNT Right 07/22/2018   Procedure: Shunt Placment right occipital;  Surgeon: Earnie Larsson, MD;  Location: [facility];  Service: Neurosurgery;  Laterality: Right;  . VENTRICULOPERITONEAL SHUNT Left 08/26/2018   Procedure: LEFT SIDED VENTRICULAR-PERITONEAL SHUNT;  Surgeon: Earnie Larsson, MD;  Location: [facility];  Service: Neurosurgery;  Laterality: Left;  [name] VENTRICULOSTOMY Right 08/09/2018   Procedure: VENTRICULOSTOMY;  Surgeon: Consuella Lose, MD;  Location: [facility];  Service: Neurosurgery;  Laterality: Right;    There were no vitals filed for this visit.  Subjective Assessment - 12/31/18 1957    Subjective  "What did I write about?" pt required cues  to recall topic of his email HW    Patient is accompained by:  Family member    Currently in Pain?  No/denies            ADULT SLP TREATMENT - 12/30/18 1615      General Information   Behavior/Cognition  Alert;Cooperative;Pleasant mood      Treatment Provided   Treatment provided  Cognitive-Linquistic      Pain Assessment   Pain Assessment  No/denies pain       Cognitive-Linquistic Treatment   Treatment focused on  Aphasia;Cognition    Skilled Treatment  Pt sent email responses to SLP for homework, summarizing his opinions about 2 assigned topics. Responses/summaries were appropriate, concise, and free from spelling or grammatical errors. SLP engaged pt in mod complex conversation re: [employer] regulation changes. [name] conversation functional with extended time occasionally and SLP request for clarification x2. Pt independently used gesture/word substitution (tapping his binder and saying "book" when he meant "education") to relay his message and continue conversation. In mod complex conversation re: goals, pt demo'd awareness that he would have difficulty returning to work in the same capacity as prior to his shunt revisions. SLP discussed pt's interests and what he would consider when thinking about alternatives. Pt to generate email responses to SLP questions re: this topic for homework. Vocational rehabilitation information provided during previous session; pt/wife report they have not followed up with this yet.       Assessment / Recommendations / Plan   Plan  Continue with current plan of care      Progression Toward Goals   Progression toward goals  Progressing toward goals         SLP Short Term Goals - 12/20/18 0805      SLP SHORT TERM GOAL #1   Title  Pt will complete standardized assessment of cognition.    Status  Achieved      SLP SHORT TERM GOAL #2   Title  Pt will ID object/picture to simple description/feature/function f:4 with occasional min A over 3 sessions     Status  Partially Met      SLP SHORT TERM GOAL #3   Title  Pt will demo auditory comprehension of 5 minutes simple-mod complex conversation by responding appropriately or asking questions over 2 sessions    Status  Achieved      SLP SHORT TERM GOAL #4   Title  Pt will name basic objects/pictures 7/10 correct with occasional mod A over 3 sessions     Status  Partially Met       SLP SHORT TERM GOAL #5   Title  Pt will demo sustained attention for 10 minutes in simple cognitive-linguistic task x3 sessions.    Status  Achieved      SLP SHORT TERM GOAL #6   Title  Pt will have a memory system to assist in managing appointments, schedules, medical and therapy information and bring with him to 3 therapy sessions     Status  Achieved       SLP Long Term Goals - 12/31/18 2000      SLP LONG TERM GOAL #5   Title  Pt will utilize memory compensation system to recall details/manage appointments, schedules, medical and therapy information with rare min A over 4 sessions      Baseline  11/28/18, 12/23/18    Time  2    Period  Weeks    Status  On-going      SLP  LONG TERM GOAL #6   Title  Pt will demo WFL reading comprehension for high interest materials (texts, emails, news articles)by answering questions,summarizing material, or composing responses x3 sessions.    Baseline  12/26/18, 12/30/18    Time  2    Period  Weeks    Status  On-going      SLP LONG TERM GOAL #7   Title  Pt will generate appropriate written or typed responses to emails and text messages >90% accuracy (compensations allowed) x 3 sessions    Baseline  12/30/18    Time  2    Period  Weeks    Status  On-going      SLP LONG TERM GOAL #8   Title  Pt will participate functionally in 10 minutes mod complex conversation with conversational supports for aphasia over 3 sessions.    Baseline  12/30/18    Time  2    Period  Weeks    Status  On-going       Plan - 12/31/18 2002    Clinical Impression Statement  Charles Marquez continues to present with aphasia, attention and memory impairments. Mod complex conversation today is functional with extended time, supportive conversation strategies. Written expression improving; pt responses overall more concise and with greater content than in previous sessions. Anxiety continues to play a role in pt's performance; suspect this hinders/slows his processing and  response time. Continue skilled ST to maximize communication and cognition for wants/needs, safety, independence and QOL.    Speech Therapy Frequency  2x / week    Duration  --   25 visits   Treatment/Interventions  Cognitive reorganization;Multimodal communcation approach;Environmental controls;Compensatory strategies;Language facilitation;Compensatory techniques;Cueing hierarchy;Internal/external aids;Functional tasks;SLP instruction and feedback;Patient/family education    Potential to Achieve Goals  Good    Potential Considerations  Severity of impairments       Patient will benefit from skilled therapeutic intervention in order to improve the following deficits and impairments:   Aphasia  Cognitive communication deficit    Problem List Patient Active Problem List   Diagnosis Date Noted  . Visual disturbance   . Slow transit constipation   . Hypoalbuminemia due to protein-calorie malnutrition ([place])   . Acute blood loss anemia   . S/P VP shunt   . Hydrocephalus ([place]) 08/30/2018  . Dyslipidemia   . History of CVA (cerebrovascular accident)   . Benign essential HTN   . Tachycardia   . Leukocytosis   . Hyponatremia   . Hypokalemia   . 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 Infection of ventricular shunt ([place]) 08/09/2018  . Bacterial meningitis 08/09/2018  . TIA (transient ischemic attack) 01/29/2017  . Acute encephalopathy   . Shunt malfunction 03/13/2016  . 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 Hyperlipidemia 10/20/2014  . Degenerative disc disease, lumbar 04/15/2013  . Routine general medical examination at a health care facility 07/23/2012  . DISTURBANCE OF SKIN SENSATION 10/05/2008  . HYPERLIPIDEMIA 01/01/2008  . MYCOPLASMA PNEUMONIA 01/01/2008   Deneise Lever, [place], [place] 12/31/2018, 8:04 PM  [facility] [address] [place], [place], [address] Phone: [phone]   Fax:  [phone]   Name: Charles Marquez MRN: [id number] Date of Birth: Jun 09, 1956

## 2019-01-02 ENCOUNTER — Other Ambulatory Visit: Payer: Self-pay

## 2019-01-02 ENCOUNTER — Ambulatory Visit: Payer: BLUE CROSS/BLUE SHIELD | Admitting: Occupational Therapy

## 2019-01-02 ENCOUNTER — Ambulatory Visit: Payer: BLUE CROSS/BLUE SHIELD | Admitting: Speech Pathology

## 2019-01-02 ENCOUNTER — Encounter: Payer: Self-pay | Admitting: Occupational Therapy

## 2019-01-02 DIAGNOSIS — I69918 Other symptoms and signs involving cognitive functions following unspecified cerebrovascular disease: Secondary | ICD-10-CM

## 2019-01-02 DIAGNOSIS — R41842 Visuospatial deficit: Secondary | ICD-10-CM

## 2019-01-02 DIAGNOSIS — R41841 Cognitive communication deficit: Secondary | ICD-10-CM | POA: Diagnosis not present

## 2019-01-02 NOTE — Therapy (Signed)
[facility] [address] [place], [place], [address] Phone: [phone]   Fax:  [phone]  Occupational Therapy Evaluation  Patient Details  Name: Charles Marquez MRN: [id number] Date of Birth: [birth year] Referring Provider (OT): Pool   Encounter Date: 01/02/2019  OT End of Session - 01/02/19 1629    Visit Number  1    Number of Visits  1    Date for OT Re-Evaluation  01/02/19    OT Start Time  1533    OT Stop Time  1605    OT Time Calculation (min)  32 min    Behavior During Therapy  [facility] for tasks assessed/performed       Past Medical History:  Diagnosis Date  . Anxiety   . Hypercholesteremia   . Stroke (HCC)    tia's  . TIA (transient ischemic attack)    09.15    Past Surgical History:  Procedure Laterality Date  . Fractured arm Left 12  . HERNIA REPAIR Right 3/12  . LAPAROSCOPIC REVISION VENTRICULAR-PERITONEAL (V-P) SHUNT N/A 08/26/2018   Procedure: LAPAROSCOPIC INSERTION VENTRICULAR-PERITONEAL (V-P) SHUNT;  Surgeon: Julio Sicks, MD;  [facility];  Service: Neurosurgery;  Laterality: N/A;  . LOOP RECORDER INSERTION N/A 04/10/2017   Procedure: Loop Recorder Insertion;  Surgeon: Hillis Range, MD;  [facility];  Service: Cardiovascular;  Laterality: N/A;  . SHUNT REMOVAL Right 03/13/2016   Procedure: SHUNT REMOVAL;  Surgeon: Julio Sicks, MD;  [facility];  Service: Neurosurgery;  Laterality: Right;  . SHUNT REMOVAL Right 08/09/2018   Procedure: SHUNT REMOVAL With Placement of Ventricular Catheter;  Surgeon: Lisbeth Renshaw, MD;  Location: [facility];  Service: Neurosurgery;  Laterality: Right;  . SHUNT REVISION Right 08/05/2018   Procedure: SHUNT REVISION;  Surgeon: Julio Sicks, MD;  Location[facility];  Service: Neurosurgery;  Laterality: Right;  . SHUNT REVISION Left 12/06/2018   Procedure: Shunt Revision - left;  Surgeon: Julio Sicks, MD;  [facility];  Service:  Neurosurgery;  Laterality: Left;  Shunt Revision - left  . SHUNT REVISION N/A 12/09/2018   Procedure: SHUNT REVISION;  Surgeon: Julio Sicks, MD;  Location: [facility] OR;  Service: Neurosurgery;  Laterality: N/A;  . SHUNT REVISION VENTRICULAR-PERITONEAL Left 08/26/2018   Procedure: SHUNT REVISION VENTRICULAR-PERITONEAL;  Surgeon: Julio Sicks, MD;  [facility];  Service: Neurosurgery;  Laterality: Left;  . SHUNT REVISION VENTRICULAR-PERITONEAL Left 09/02/2018   Procedure: Left Occipital VP shunt revision;  Surgeon: Julio Sicks, MD;  Location: [facility] OR;  Service: Neurosurgery;  Laterality: Left;  [name] VASECTOMY  10/02/1997  . 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 VENTRICULOPERITONEAL SHUNT Right 07/22/2018   Procedure: Shunt Placment right occipital;  Surgeon: Julio Sicks, MD;  Location[facility];  Service: Neurosurgery;  Laterality: Right;  . VENTRICULOPERITONEAL SHUNT Left 08/26/2018   Procedure: LEFT SIDED VENTRICULAR-PERITONEAL SHUNT;  Surgeon: Julio Sicks, MD;  Location: [facility] OR;  Service: Neurosurgery;  Laterality: Left;  [name] VENTRICULOSTOMY Right 08/09/2018   Procedure: VENTRICULOSTOMY;  Surgeon: Lisbeth Renshaw, MD;  Location: [facility] OR;  Service: Neurosurgery;  Laterality: Right;    There were no vitals filed for this visit.  Subjective Assessment - 01/02/19 1541    Subjective   Headache 2-3 times/week mostly at night  Patient is accompained by:  Family member    Pertinent History  S/p multiple VP shunt revisions, shunt infection, hydrocephalus    Patient Stated Goals  well you know - I want this better (pt is aphasic)    Currently in Pain?  No/denies    Pain Score  0-No pain        [facility] OT Assessment - 01/02/19 1544      Assessment   Medical Diagnosis  hydrocephalus - shunt revision     Referring Provider (OT)  Pool    Onset Date/Surgical Date  12/18/18    Hand Dominance  Right    Prior Therapy  Prior Inpatint and OP rehab      Precautions   Precautions  Other (comment)    Precaution Comments  10lb lifting restriction; R hemianopsia, TIA, CVA, hypercholesteremia, anxiety and ventriculostomy with 10 shunt revisions      Restrictions   Other Position/Activity Restrictions  Lifting restrictions 10 lb      Prior Function   Level of Independence  Independent with basic ADLs      ADL   Eating/Feeding  Independent    Grooming  Independent    Upper Body Bathing  Independent    Lower Body Bathing  Independent    Upper Body Dressing  Independent    Lower Body Dressing  Independent    Toilet Transfer  Independent    Tub/Shower Transfer  Modified independent    Tub/Shower Transfer Equipment  Grab bars   discontinued tub bench     IADL   Medication Management  Takes responsibility if medication is prepared in advance in seperate dosage      Written Expression   Dominant Hand  Right    Handwriting  100% legible      Vision - History   Baseline Vision  Wears glasses all the time      Vision Assessment   Eye Alignment  Within Functional Limits    Ocular Range of Motion  Within Functional Limits    Tracking/Visual Pursuits  Able to track stimulus in all quads without difficulty    Visual Fields  Right homonymous Hemianopsia    Comment  r homonomous hemianopsia      Cognition   Overall Cognitive Status  History of cognitive impairments - at baseline    Cognition Comments  Patient with prior memory deficits, complicated by intermittent anxiety.  Patient and wife have solid compensatory strategies related to daily self care skills.        Posture/Postural Control   Posture/Postural Control  No significant limitations      Sensation   Light Touch  Appears Intact      Coordination   Gross Motor Movements are Fluid and Coordinated  Yes    Fine Motor Movements are  Fluid and Coordinated  Yes    9 Hole Peg Test  Right;Left    Right 9 Hole Peg Test  27.84    Left 9 Hole Peg Test  27.06      Praxis   Praxis-Other Comments  Wife describes initial apraxia - toothpast on cup immediatey following recent sunt revision - resolved.        Hand Function   Right Hand Gross Grasp  Functional    Right Hand Grip (lbs)  95    Right Hand Lateral Pinch  22 lbs    Left Hand Gross Grasp  Functional    Left Hand Grip (lbs)  100  OT Education - 01/02/19 1628    Education Details  reviewed OT eval results.  No need for add'l OT at this time.      Person(s) Educated  Patient;Spouse    Methods  Explanation    Comprehension  Verbalized understanding                 Plan - 01/02/19 1630    Clinical Impression Statement  Patient returns for OT eval after recent VP shunt revision.  Patient is currently working with both PT and SLP, but has no additional OT needs at this time. Patient remains at same functional level as he was at OT discharge.  Patient and wife in agreement.      Occupational Profile and client history currently impacting functional performance  PMH: multiple shunt revisions, anxiety, TIA, LUE fx, communicating hydrocelphalus.     Occupational performance deficits (Please refer to evaluation for details):  IADL's;Work    Plan  xxx    Consulted and Agree with Plan of Care  Patient;Family member/caregiver    Family Member Consulted  wife       Patient will benefit from skilled therapeutic intervention in order to improve the following deficits and impairments:   na  Visit Diagnosis: Visuospatial deficit - Plan: Ot plan of care cert/re-cert  Other symptoms and signs involving cognitive functions following unspecified cerebrovascular disease - Plan: Ot plan of care cert/re-cert    Problem List Patient Active Problem List   Diagnosis Date Noted  . Visual disturbance   . Slow transit constipation   .  Hypoalbuminemia due to protein-calorie malnutrition (HCC)   . Acute blood loss anemia   . S/P VP shunt   . Hydrocephalus ([facility]) 08/30/2018  . Dyslipidemia   . History of CVA (cerebrovascular accident)   . Benign essential HTN   . Tachycardia   . Leukocytosis   . Hyponatremia   . Hypokalemia   . 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 Bacterial meningitis 08/09/2018  . TIA (transient ischemic attack) 01/29/2017  . Acute encephalopathy   . Shunt malfunction 03/13/2016  . 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 Hyperlipidemia 10/20/2014  . Degenerative disc disease, lumbar 04/15/2013  . Routine general medical examination at a health care facility 07/23/2012  . DISTURBANCE OF SKIN SENSATION 10/05/2008  . HYPERLIPIDEMIA 01/01/2008  . MYCOPLASMA PNEUMONIA 01/01/2008    Collier Salina , OTR/L 01/02/2019, 5:01 PM  [facility] [address] [place], [place], [address] Phone: [phone]   Fax:  [phone]  Name: Charles Marquez MRN: [id number] Date of Birth: [birth year]

## 2019-01-06 ENCOUNTER — Other Ambulatory Visit: Payer: Self-pay | Admitting: Neurosurgery

## 2019-01-06 DIAGNOSIS — G910 Communicating hydrocephalus: Secondary | ICD-10-CM

## 2019-01-07 ENCOUNTER — Encounter: Payer: Self-pay | Admitting: Physical Therapy

## 2019-01-07 ENCOUNTER — Ambulatory Visit: Payer: BLUE CROSS/BLUE SHIELD | Admitting: Physical Therapy

## 2019-01-07 VITALS — BP 131/86 | HR 97

## 2019-01-07 DIAGNOSIS — R2681 Unsteadiness on feet: Secondary | ICD-10-CM

## 2019-01-07 DIAGNOSIS — R41841 Cognitive communication deficit: Secondary | ICD-10-CM | POA: Diagnosis not present

## 2019-01-07 DIAGNOSIS — R29818 Other symptoms and signs involving the nervous system: Secondary | ICD-10-CM

## 2019-01-07 DIAGNOSIS — M6281 Muscle weakness (generalized): Secondary | ICD-10-CM

## 2019-01-07 DIAGNOSIS — R2689 Other abnormalities of gait and mobility: Secondary | ICD-10-CM

## 2019-01-07 NOTE — Therapy (Signed)
[facility] [address] [place], [place], [address] Phone: [phone]   Fax:  [phone]  Physical Therapy Treatment  Patient Details  Name: Charles Marquez MRN: [id number] Date of Birth: September 17, 1956 Referring Provider (PT): Kathaleen Maser. Pool, MD   Encounter Date: 01/07/2019  PT End of Session - 01/07/19 1738    Visit Number  4    Number of Visits  9    Date for PT Re-Evaluation  02/18/19    Authorization Type  BCBS OTHER     Authorization Time Period  PT/OT/ST/Hydro therapy covered x 90 days    PT Start Time  1149    PT Stop Time  1232    PT Time Calculation (min)  43 min    Equipment Utilized During Treatment  Gait belt    Activity Tolerance  Patient tolerated treatment well    Behavior During Therapy  WFL for tasks assessed/performed       Past Medical History:  Diagnosis Date  . Anxiety   . Hypercholesteremia   . Stroke (HCC)    tia's  . TIA (transient ischemic attack)    09.15    Past Surgical History:  Procedure Laterality Date  . Fractured arm Left 12  . HERNIA REPAIR Right 3/12  . LAPAROSCOPIC REVISION VENTRICULAR-PERITONEAL (V-P) SHUNT N/A 08/26/2018   Procedure: LAPAROSCOPIC INSERTION VENTRICULAR-PERITONEAL (V-P) SHUNT;  Surgeon: Julio Sicks, MD;  [facility]: [facility] OR;  Service: Neurosurgery;  Laterality: N/A;  . LOOP RECORDER INSERTION N/A 04/10/2017   Procedure: Loop Recorder Insertion;  Surgeon: Hillis Range, MD;  [facility]: [facility] INVASIVE CV LAB;  Service: Cardiovascular;  Laterality: N/A;  . SHUNT REMOVAL Right 03/13/2016   Procedure: SHUNT REMOVAL;  Surgeon: Julio Sicks, MD;  Location: [facility] NEURO ORS;  Service: Neurosurgery;  Laterality: Right;  . SHUNT REMOVAL Right 08/09/2018   Procedure: SHUNT REMOVAL With Placement of Ventricular Catheter;  Surgeon: Lisbeth Renshaw, MD;  Location: [facility] OR;  Service: Neurosurgery;  Laterality: Right;  . SHUNT REVISION Right 08/05/2018   Procedure: SHUNT REVISION;   Surgeon: Julio Sicks, MD;  Location: [facility] OR;  Service: Neurosurgery;  Laterality: Right;  . SHUNT REVISION Left 12/06/2018   Procedure: Shunt Revision - left;  Surgeon: Julio Sicks, MD;  Location: [facility] OR;  Service: Neurosurgery;  Laterality: Left;  Shunt Revision - left  . SHUNT REVISION N/A 12/09/2018   Procedure: SHUNT REVISION;  Surgeon: Julio Sicks, MD;  Location: [facility] OR;  Service: Neurosurgery;  Laterality: N/A;  . SHUNT REVISION VENTRICULAR-PERITONEAL Left 08/26/2018   Procedure: SHUNT REVISION VENTRICULAR-PERITONEAL;  Surgeon: Julio Sicks, MD;  [facility] OR;  Service: Neurosurgery;  Laterality: Left;  . SHUNT REVISION VENTRICULAR-PERITONEAL Left 09/02/2018   Procedure: Left Occipital VP shunt revision;  Surgeon: Julio Sicks, MD;  Location: [facility] OR;  Service: Neurosurgery;  Laterality: Left;  [name] VASECTOMY  10/02/1997  . 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 VENTRICULOPERITONEAL SHUNT Right 07/22/2018   Procedure: Shunt Placment right occipital;  Surgeon: Julio Sicks, MD;  Location: [facility] OR;  Service: Neurosurgery;  Laterality: Right;  . VENTRICULOPERITONEAL SHUNT Left 08/26/2018   Procedure: LEFT SIDED VENTRICULAR-PERITONEAL SHUNT;  Surgeon: Julio Sicks, MD;  Location: [facility] OR;  Service: Neurosurgery;  Laterality: Left;  [name] VENTRICULOSTOMY Right 08/09/2018   Procedure: VENTRICULOSTOMY;  Surgeon: Lisbeth Renshaw, MD;  Location: [facility] OR;  Service: Neurosurgery;  Laterality: Right;    Vitals:   01/07/19 1204  BP: 131/86  Pulse: 97    Subjective Assessment - 01/07/19 1154    Subjective  Denies any falls or changes since last visit.  Looks to his friend at times for answers to questions.  [name] friend/neighbor states pt walks in the neighborhood without assistance and at times tries running.  Friend  states he feels like this is unsafe for patient and wanted [place] to be aware.    Patient is accompained by:  Family member   friend   Pertinent History   S/p VP shunt, Hydrocephalus, CVA with loop recorder insertion 04/10/2017 as well as R occipital VP shunt placement 2016, Infection of ventricular shunt, shunt malfunction, HLD, anxiety, TIA, Hypercholesteremia, acute blood loss anemia, HTN, Tachycardia, Leukocytosis, Hyponatremia, Bacterial Encephalitis, Bacterial Meningitis, Dyslipidemia, DDD lumbar spine, and Mycoplasma Pneumonia.     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  No/denies          [facility] Adult PT Treatment/Exercise - 01/07/19 0001      Transfers   Transfers  Sit to Stand;Stand to Sit    Sit to Stand  6: Modified independent (Device/Increase time)    Stand to Sit  6: Modified independent (Device/Increase time)      Ambulation/Gait   Ambulation/Gait  Yes    Ambulation/Gait Assistance  5: Supervision    Ambulation/Gait Assistance Details  needing min/min guard assist at times due to veering to R and almost running into objects on R     Ambulation Distance (Feet)  --   See gait details   Assistive device  None    Gait Pattern  Step-through pattern;Narrow base of support;Decreased dorsiflexion - right    Ambulation Surface  Level;Indoor    Gait Comments  Treadmill x 6 minutes bil UE support working on step length and bil heel strike.  Tends to shuffle and foot flat.  Pt with "tight" grip on handle and stiff UE's.  Appears fearful and has problems with motor planning/coordinating activities on treadmill.  Unable to let go with either UE and maintain balance.  Gait around gym working on walking and pt tossing/catching ball in his visual field.  Backward walking and side walking while tossing/catching ball  Pt with frequent bouts of  LOB needing min assist to recover.  Min assist needed with backward walking >30% of time due to posterior lean.        Self-Care   Self-Care  Other Self-Care Comments    Other Self-Care Comments   [name] friend/neighbor accompanied pt to therapy today.  He reports pt has been walking in the neighborhood by himself and has even tried running/jogging at times.  Neighbor told pt during session that he hated to "tell on him but did not want to see him get hurt."  Discussed decreased balance reactions and fall risk with patient as well as uneven surfaces outside in addition to risks of having a fall.  Informed pt that PTA agreed that it was not safe for pt to be ambulating outdoors alone and did not want pt to attempt to run with or without supervision.  Pt agrees.          Balance Exercises - 01/07/19 1728  Balance Exercises: Standing   Other Standing Exercises  Stepping onto/off of 6" step with 1 UE support x 15 reps each side.  Taps to 6" step then 6", 12", 6" and floor with intermittent UE support.  Pt with difficulty staying on task and often forgetting sequence.  Taps, double taps and tipping/uprighting cones on solid surface.  Pt again forgetting sequence at times and with min assist at times to recover LOB.  Decreased weight shift and decreased SLS time on R side.        PT Education - 01/07/19 1734    Education Details  Fall risk, recommendation not to walk in neighborhood/outdoors without assistance and not to attempt running    Person(s) Educated  Patient;Other (comment)   friend/neighbor   Methods  Explanation    Comprehension  Verbalized understanding;Other (comment)   needs continued reinforcement      PT Short Term Goals - 12/20/18 0911      PT SHORT TERM GOAL #1   Title  = LTG        PT Long Term Goals - 12/20/18 0911      PT LONG TERM GOAL #1   Title  Pt will be independent and compliant with performing PROGRESSED HEP to improve balance, strength, and functional  mobility.    Time  8    Period  Weeks    Status  New    Target Date  02/18/19      PT LONG TERM GOAL #2   Title  Pt maintain gait velocity of >4.0 ft/sec but demonstrate decreased veering and running into objects on R due to improved awareness of field cut    Time  8    Period  Weeks    Status  New    Target Date  02/18/19      PT LONG TERM GOAL #3   Title  Pt will demonstrate 12 steps with no HR's at mod I level with improved control for forward momentum when descending    Time  8    Period  Weeks    Status  New    Target Date  02/18/19      PT LONG TERM GOAL #4   Title  Pt will improve FGA score to >/=26/30 indicating improvement in functional mobility.     Baseline  24/30    Time  8    Period  Weeks    Status  New    Target Date  02/18/19      PT LONG TERM GOAL #5   Title  Pt will demonstrate improved ability to perform dual tasking as indicated by <20% difference between TUG and COG TUG    Baseline  22% difference (9.03 TUG, 11.03 COG TUG)    Time  8    Period  Weeks    Status  New    Target Date  02/18/19            Plan - 01/07/19 1738    Clinical Impression Statement  Skilled session focused on high level balance and dynamic gait along with multitasking.  Pt appears to have decreased insight/awareness into his deficits at times.  Long discussion on fall risk and deficits with patient and friend.  Continues to have LOB with high level balance and dynamic gait as well as veering to R.  Continue PT per POC.    Rehab Potential  Good    PT Frequency  1x / week    PT Duration  8 weeks    PT Treatment/Interventions  ADLs/Self Care Home Management;Therapeutic activities;Therapeutic exercise;Balance training;Neuromuscular re-education;Visual/perceptual remediation/compensation;Patient/family education;Stair training;Gait training;Functional mobility training;DME Instruction;Cognitive remediation    PT Next Visit Plan  LIFTING RESTRICTION OF 10LB; AVOID STRAIN ON  ABDOMINAL INCISION.  Gait with scanning, especially to R.  Treadmill working on step length and foot clearance then carry over to level ground.  Continue to work on balance with eyes closed.  activities with dual tasking.  Floor ladder.  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  high level balance and strengthening in quadruped/ tall kneeling.    PT Home Exercise Plan  [id number]     Consulted and Agree with Plan of Care  Patient;Other (Comment)    Family Member Consulted  friend/neighbor       Patient will benefit from skilled therapeutic intervention in order to improve the following deficits and impairments:  Abnormal gait, Decreased safety awareness, Impaired vision/preception, Decreased balance, Decreased cognition, Decreased strength, Difficulty walking  Visit Diagnosis: Other symptoms and signs involving the nervous system  Muscle weakness (generalized)  Unsteadiness on feet  Other abnormalities of gait and mobility     Problem List Patient Active Problem List   Diagnosis Date Noted  . Visual disturbance   . Slow transit constipation   . Hypoalbuminemia due to protein-calorie malnutrition (HCC)   . Acute blood loss anemia   . S/P VP shunt   . Hydrocephalus (HCC) 08/30/2018  . Dyslipidemia   . History of CVA (cerebrovascular accident)   . Benign essential HTN   . Tachycardia   . Leukocytosis   . Hyponatremia   . Hypokalemia   . 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 Bacterial meningitis 08/09/2018  . TIA (transient ischemic attack) 01/29/2017  . Acute encephalopathy   . Shunt malfunction 03/13/2016  . 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 Hyperlipidemia 10/20/2014  . Degenerative disc disease, lumbar 04/15/2013  . Routine general medical examination at a health care facility 07/23/2012  .  DISTURBANCE OF SKIN SENSATION 10/05/2008  . HYPERLIPIDEMIA 01/01/2008  . MYCOPLASMA PNEUMONIA 01/01/2008    Newell Coral, PTA [facility] 01/07/19 5:43 PM Phone: [phone] Fax: [phone]   [facility] [address] [place], [place], [address] Phone: [phone]   Fax:  [phone]  Name: Charles Marquez MRN: [id number] Date of Birth: 1956-07-07

## 2019-01-08 ENCOUNTER — Ambulatory Visit
Admission: RE | Admit: 2019-01-08 | Discharge: 2019-01-08 | Disposition: A | Discharge status: Home / Self Care | Payer: BLUE CROSS/BLUE SHIELD | Source: Ambulatory Visit | Attending: Neurosurgery | Admitting: Neurosurgery

## 2019-01-08 DIAGNOSIS — G910 Communicating hydrocephalus: Secondary | ICD-10-CM

## 2019-01-09 ENCOUNTER — Ambulatory Visit: Payer: BLUE CROSS/BLUE SHIELD | Admitting: Speech Pathology

## 2019-01-09 DIAGNOSIS — R4701 Aphasia: Secondary | ICD-10-CM

## 2019-01-09 DIAGNOSIS — R41841 Cognitive communication deficit: Secondary | ICD-10-CM

## 2019-01-09 NOTE — Patient Instructions (Signed)
Homework:  Texting with family: Ask your family members to press you for more information if something is ambiguous or unclear. Remember your answers aren't "wrong;" sometimes you just need to give more information.   Keep working on your [employer].

## 2019-01-10 NOTE — Therapy (Signed)
[facility] [address] [place], [place], [address] Phone: [phone]   Fax:  [phone]  Speech Language Pathology Treatment  Patient Details  Name: Charles Marquez MRN: [id number] Date of Birth: 29-Jun-1956 Referring Provider (SLP): Dr. Posey Pronto   Encounter Date: 01/09/2019  End of Session - 01/10/19 1140    Visit Number  23    Number of Visits  25    Date for SLP Re-Evaluation  01/17/19    SLP Start Time  1316    SLP Stop Time   1400    SLP Time Calculation (min)  44 min    Activity Tolerance  Patient tolerated treatment well       Past Medical History:  Diagnosis Date  . Anxiety   . Hypercholesteremia   . Stroke ([place])    tia's  . TIA (transient ischemic attack)    09.15    Past Surgical History:  Procedure Laterality Date  . Fractured arm Left 12  . HERNIA REPAIR Right 3/12  . LAPAROSCOPIC REVISION VENTRICULAR-PERITONEAL (V-P) SHUNT N/A 08/26/2018   Procedure: LAPAROSCOPIC INSERTION VENTRICULAR-PERITONEAL (V-P) SHUNT;  Surgeon: Earnie Larsson, MD;  Location: [facility];  Service: Neurosurgery;  Laterality: N/A;  . LOOP RECORDER INSERTION N/A 04/10/2017   Procedure: Loop Recorder Insertion;  Surgeon: Thompson Grayer, MD;  Location: [place] CV LAB;  Service: Cardiovascular;  Laterality: N/A;  . SHUNT REMOVAL Right 03/13/2016   Procedure: SHUNT REMOVAL;  Surgeon: Earnie Larsson, MD;  [facility] NEURO ORS;  Service: Neurosurgery;  Laterality: Right;  . SHUNT REMOVAL Right 08/09/2018   Procedure: SHUNT REMOVAL With Placement of Ventricular Catheter;  Surgeon: Consuella Lose, MD;  Location: [place];  Service: Neurosurgery;  Laterality: Right;  . SHUNT REVISION Right 08/05/2018   Procedure: SHUNT REVISION;  Surgeon: Earnie Larsson, MD;  Location: [place];  Service: Neurosurgery;  Laterality: Right;  . SHUNT REVISION Left 12/06/2018   Procedure: Shunt Revision - left;  Surgeon: Earnie Larsson, MD;  Location: [place];  Service:  Neurosurgery;  Laterality: Left;  Shunt Revision - left  . SHUNT REVISION N/A 12/09/2018   Procedure: SHUNT REVISION;  Surgeon: Earnie Larsson, MD;  Location: [place];  Service: Neurosurgery;  Laterality: N/A;  . SHUNT REVISION VENTRICULAR-PERITONEAL Left 08/26/2018   Procedure: SHUNT REVISION VENTRICULAR-PERITONEAL;  Surgeon: Earnie Larsson, MD;  Location: [place];  Service: Neurosurgery;  Laterality: Left;  . SHUNT REVISION VENTRICULAR-PERITONEAL Left 09/02/2018   Procedure: Left Occipital VP shunt revision;  Surgeon: Earnie Larsson, MD;  Location: [place];  Service: Neurosurgery;  Laterality: Left;  [name] VASECTOMY  10/02/1997  . VENTRICULOPERITONEAL SHUNT Right 12/18/2014   Procedure: Shunt Placment - right occipital VP shunt ;  Surgeon: Charlie Pitter, MD;  Location: [place] NEURO ORS;  Service: Neurosurgery;  Laterality: Right;  Shunt Placment - right occipital VP shunt   . VENTRICULOPERITONEAL SHUNT Right 07/22/2018   Procedure: Shunt Placment right occipital;  Surgeon: Earnie Larsson, MD;  Location: [place];  Service: Neurosurgery;  Laterality: Right;  . VENTRICULOPERITONEAL SHUNT Left 08/26/2018   Procedure: LEFT SIDED VENTRICULAR-PERITONEAL SHUNT;  Surgeon: Earnie Larsson, MD;  Location: [place];  Service: Neurosurgery;  Laterality: Left;  [name] VENTRICULOSTOMY Right 08/09/2018   Procedure: VENTRICULOSTOMY;  Surgeon: Consuella Lose, MD;  Location: [place];  Service: Neurosurgery;  Laterality: Right;    There were no vitals filed for this visit.  Subjective Assessment - 01/10/19 1122    Subjective  "It seems this isn't a great day for  me talking about things."    Patient is accompained by:  Family member   daughter   Currently in Pain?  No/denies            ADULT SLP TREATMENT - 01/10/19 1138      General Information   Behavior/Cognition  Alert;Cooperative;Pleasant mood      Treatment Provided   Treatment provided  Cognitive-Linquistic      Cognitive-Linquistic Treatment   Treatment focused on   Aphasia;Cognition    Skilled Treatment  Pt arrives with daughter. She/pt report slower communication today; pt had CT head yesterday which showed stable VP shunt; has had sinus pressure headaches which he feels is impacting his thinking/speech. Short term recall continues to impact pt in discussion of recent activities. Continues to use memory notebook/log though occasionally needs cues to reference. SLP reviewed pt's homework with him (email to SLP). Pt responded appropriately to all questions in email. SLP noted one instance of vague description/dysnomia. Pt ID'd with min question cues. SLP used prompt, "Tell me more about what you mean," and pt elaborated verbally. SLP wrote notes/keywords as pt spoke, and pt ID'd more appropriate word choice: "responsibilities" vs "options". In mod complex conversation, pt's speech functional with extended time and supportive conversation techniques. Daughter also demo'ing appropriate cuing/supportive strategies throughout session. She reports she feels pt communicating successfully verbally at home, and that breakdowns most often occur via text. She provided an example: pt commenting on a video her husband sent in a group text, "failure makes for a great life." SLP cued pt to elaborate on his opinions, and he generated 2 additional sentences to successfully convey his thoughts. SLP told daughter to request more information from pt when texts are unclear. Pt agreed this would be good to work on at home. Pt is also completing daily emails to prompt questions from daughter.      Assessment / Recommendations / Plan   Plan  Continue with current plan of care         SLP Short Term Goals - 01/10/19 1144      SLP SHORT TERM GOAL #1   Title  Pt will complete standardized assessment of cognition.    Status  Achieved      SLP SHORT TERM GOAL #2   Title  Pt will ID object/picture to simple description/feature/function f:4 with occasional min A over 3 sessions     Status   Partially Met      SLP SHORT TERM GOAL #3   Title  Pt will demo auditory comprehension of 5 minutes simple-mod complex conversation by responding appropriately or asking questions over 2 sessions    Status  Achieved      SLP SHORT TERM GOAL #4   Title  Pt will name basic objects/pictures 7/10 correct with occasional mod A over 3 sessions     Status  Partially Met      SLP SHORT TERM GOAL #5   Title  Pt will demo sustained attention for 10 minutes in simple cognitive-linguistic task x3 sessions.    Status  Achieved      SLP SHORT TERM GOAL #6   Title  Pt will have a memory system to assist in managing appointments, schedules, medical and therapy information and bring with him to 3 therapy sessions     Status  Achieved       SLP Long Term Goals - 01/10/19 1144      SLP LONG TERM GOAL #1   Title  Pt will write or name 5 words in personally relevant category with occasional min A over 4 sessions     Status  Partially Met      SLP LONG TERM GOAL #2   Title  Pt will demo error awareness by attempting correction or by nonverbal response to errors 75% of the time over 3 sessions    Status  Achieved      SLP LONG TERM GOAL #3   Title  Pt will participate functionally in 10 minutes simple-mod complex conversation with conversational supports for aphasia over 3 sessions.    Status  Achieved      SLP LONG TERM GOAL #4   Title  Pt will demo selective attention in min noisy environment for 10 minutes in a simple-mod complex cognitive linguistic task over three sessions    Status  Deferred      SLP LONG TERM GOAL #5   Title  Pt will utilize memory compensation system to recall details/manage appointments, schedules, medical and therapy information with rare min A over 4 sessions      Baseline  11/28/18, 12/23/18, 01/09/19    Time  1    Period  Weeks    Status  On-going      SLP LONG TERM GOAL #6   Title  Pt will demo WFL reading comprehension for high interest materials (texts, emails,  news articles)by answering questions,summarizing material, or composing responses x3 sessions.    Baseline  12/26/18, 12/30/18    Time  1    Period  Weeks    Status  On-going      SLP LONG TERM GOAL #7   Title  Pt will generate appropriate written or typed responses to emails and text messages >90% accuracy (compensations allowed) x 3 sessions    Baseline  12/30/18, 01/09/19    Time  1    Period  Weeks    Status  On-going      SLP LONG TERM GOAL #8   Title  Pt will participate functionally in 10 minutes mod complex conversation with conversational supports for aphasia over 3 sessions.    Baseline  12/30/18, 01/09/19    Time  1    Period  Weeks    Status  On-going       Plan - 01/10/19 1141    Clinical Impression Statement  Mr. Larin continues to present with aphasia and cognitive impairments, primarily memory. Mod complex conversation today is functional with extended time, supportive conversation strategies. Written expression improving; pt responses overall more concise and with greater content than in previous sessions. Anxiety continues to play a role in pt's performance; suspect this hinders/slows his processing and response time. Daughter demo'd appropriate cuing today for language and recall, and is providing additional opportunities to practice these skills at home. Anticipate d/c in next 1-2 sessions. Continue skilled ST to maximize communication and cognition for wants/needs, safety, independence and QOL.    Speech Therapy Frequency  2x / week    Duration  --   25 visits   Treatment/Interventions  Cognitive reorganization;Multimodal communcation approach;Environmental controls;Compensatory strategies;Language facilitation;Compensatory techniques;Cueing hierarchy;Internal/external aids;Functional tasks;SLP instruction and feedback;Patient/family education    Potential to Achieve Goals  Good    Potential Considerations  Severity of impairments       Patient will benefit from  skilled therapeutic intervention in order to improve the following deficits and impairments:   Aphasia  Cognitive communication deficit    Problem List Patient Active Problem List  Diagnosis Date Noted  . Visual disturbance   . Slow transit constipation   . Hypoalbuminemia due to protein-calorie malnutrition ([place])   . Acute blood loss anemia   . S/P VP shunt   . Hydrocephalus ([place]) 08/30/2018  . Dyslipidemia   . History of CVA (cerebrovascular accident)   . Benign essential HTN   . Tachycardia   . Leukocytosis   . Hyponatremia   . Hypokalemia   . 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 Infection of ventricular shunt ([place]) 08/09/2018  . Bacterial meningitis 08/09/2018  . TIA (transient ischemic attack) 01/29/2017  . Acute encephalopathy   . Shunt malfunction 03/13/2016  . 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 Communicating hydrocephalus ([place]) 12/18/2014  . Hyperlipidemia 10/20/2014  . Degenerative disc disease, lumbar 04/15/2013  . Routine general medical examination at a health care facility 07/23/2012  . DISTURBANCE OF SKIN SENSATION 10/05/2008  . HYPERLIPIDEMIA 01/01/2008  . MYCOPLASMA PNEUMONIA 01/01/2008   Deneise Lever, [place], [place] Speech-Language Pathologist  Aliene Altes 01/10/2019, 11:48 AM  [facility] [address] [place], [place], [address] Phone: [phone]   Fax:  [phone]   Name: Blaze Nylund MRN: [id number] Date of Birth: 06/12/1956

## 2019-01-13 ENCOUNTER — Ambulatory Visit: Payer: BLUE CROSS/BLUE SHIELD | Attending: Physical Medicine & Rehabilitation | Admitting: Speech Pathology

## 2019-01-13 DIAGNOSIS — R4701 Aphasia: Secondary | ICD-10-CM | POA: Diagnosis not present

## 2019-01-13 DIAGNOSIS — R41841 Cognitive communication deficit: Secondary | ICD-10-CM | POA: Diagnosis present

## 2019-01-13 NOTE — Patient Instructions (Signed)
Remember to keep up with your journal every day in the Daily Log section of your binder.  Try using Beckey Rutter to set reminders for yourself for jobs like laundry. Press the Red button on the side of your phone. "Remind me to check the laundry in one hour" -Give details: What time, what task? -Rehearse before you push the button!

## 2019-01-14 ENCOUNTER — Encounter: Payer: Self-pay | Admitting: Gastroenterology

## 2019-01-15 ENCOUNTER — Other Ambulatory Visit: Payer: Self-pay | Admitting: Neurosurgery

## 2019-01-15 ENCOUNTER — Ambulatory Visit: Payer: BLUE CROSS/BLUE SHIELD | Admitting: Physical Therapy

## 2019-01-15 NOTE — Therapy (Signed)
[facility] [address] [place], [place], [address] Phone: [phone]   Fax:  [phone]  Speech Language Pathology Treatment  Patient Details  Name: Charles Marquez MRN: [id number] Date of Birth: Apr 13, 1956 Referring Provider (SLP): Dr. Posey Pronto   Encounter Date: 01/13/2019  End of Session - 01/15/19 0934    Visit Number  24    Number of Visits  25    Date for SLP Re-Evaluation  01/17/19    Authorization Type  [employer] 90 visits for all disciplines     SLP Start Time  1617    SLP Stop Time   1703    SLP Time Calculation (min)  46 min    Activity Tolerance  Patient tolerated treatment well       Past Medical History:  Diagnosis Date  . Anxiety   . Hypercholesteremia   . Stroke ([place])    tia's  . TIA (transient ischemic attack)    09.15    Past Surgical History:  Procedure Laterality Date  . Fractured arm Left 12  . HERNIA REPAIR Right 3/12  . LAPAROSCOPIC REVISION VENTRICULAR-PERITONEAL (V-P) SHUNT N/A 08/26/2018   Procedure: LAPAROSCOPIC INSERTION VENTRICULAR-PERITONEAL (V-P) SHUNT;  Surgeon: Earnie Larsson, MD;  Location: [facility];  Service: Neurosurgery;  Laterality: N/A;  . LOOP RECORDER INSERTION N/A 04/10/2017   Procedure: Loop Recorder Insertion;  Surgeon: Thompson Grayer, MD;  Location: [facility];  Service: Cardiovascular;  Laterality: N/A;  . SHUNT REMOVAL Right 03/13/2016   Procedure: SHUNT REMOVAL;  Surgeon: Earnie Larsson, MD;  [facility];  Service: Neurosurgery;  Laterality: Right;  . SHUNT REMOVAL Right 08/09/2018   Procedure: SHUNT REMOVAL With Placement of Ventricular Catheter;  Surgeon: Consuella Lose, MD;  Location: [facility];  Service: Neurosurgery;  Laterality: Right;  . SHUNT REVISION Right 08/05/2018   Procedure: SHUNT REVISION;  Surgeon: Earnie Larsson, MD;  Location: [facility];  Service: Neurosurgery;  Laterality: Right;  . SHUNT REVISION Left 12/06/2018   Procedure: Shunt Revision - left;   Surgeon: Earnie Larsson, MD;  Location: [facility];  Service: Neurosurgery;  Laterality: Left;  Shunt Revision - left  . SHUNT REVISION N/A 12/09/2018   Procedure: SHUNT REVISION;  Surgeon: Earnie Larsson, MD;  Location: [facility];  Service: Neurosurgery;  Laterality: N/A;  . SHUNT REVISION VENTRICULAR-PERITONEAL Left 08/26/2018   Procedure: SHUNT REVISION VENTRICULAR-PERITONEAL;  Surgeon: Earnie Larsson, MD;  Location: [facility];  Service: Neurosurgery;  Laterality: Left;  . SHUNT REVISION VENTRICULAR-PERITONEAL Left 09/02/2018   Procedure: Left Occipital VP shunt revision;  Surgeon: Earnie Larsson, MD;  Location: [facility];  Service: Neurosurgery;  Laterality: Left;  [name] VASECTOMY  10/02/1997  . VENTRICULOPERITONEAL SHUNT Right 12/18/2014   Procedure: Shunt Placment - right occipital VP shunt ;  Surgeon: Charlie Pitter, MD;  Location: [facility];  Service: Neurosurgery;  Laterality: Right;  Shunt Placment - right occipital VP shunt   . VENTRICULOPERITONEAL SHUNT Right 07/22/2018   Procedure: Shunt Placment right occipital;  Surgeon: Earnie Larsson, MD;  Location: [facility];  Service: Neurosurgery;  Laterality: Right;  . VENTRICULOPERITONEAL SHUNT Left 08/26/2018   Procedure: LEFT SIDED VENTRICULAR-PERITONEAL SHUNT;  Surgeon: Earnie Larsson, MD;  Location: [facility];  Service: Neurosurgery;  Laterality: Left;  [name] VENTRICULOSTOMY Right 08/09/2018   Procedure: VENTRICULOSTOMY;  Surgeon: Consuella Lose, MD;  Location: [facility];  Service: Neurosurgery;  Laterality: Right;    There were no vitals filed for this visit.  Subjective Assessment - 01/15/19 7893  Subjective  "I don't remember" (pt re: what he did over the weekend    Patient is accompained by:  Family member    Currently in Pain?  Yes    Pain Score  3     Pain Location  Head            ADULT SLP TREATMENT - 01/15/19 0926      General Information   Behavior/Cognition  Alert;Cooperative;Pleasant mood      Treatment Provided   Treatment provided  Cognitive-Linquistic       Pain Assessment   Pain Assessment  No/denies pain      Cognitive-Linquistic Treatment   Treatment focused on  Cognition    Skilled Treatment  Pt did not keep up with his journal/daily log. Pt was very apologetic: SLP educated that purpose of this is to aid pt, not for wife or SLP. Pt seemed distressed that he could not remember what he had done so far today. Pt started new week of his daily log and entered dates correctly. SLP had pt "work backwards" and he was able to recall 3 events today with mod question cues from SLP, wife, and record these on his daily log. Pt realized he started laundry but did not remember to place in the dryer. SLP suggested use of a timer and pt agreed this would be helpful. Wife reports using signs around the house for reminders for pt; eventually pt becomes habituated and overlooks. Suggested use of recurring alerts on pt's phone. Demo'd setting an alarm with a specific label for the event, eg: "brush teeth." SLP worked with pt to use [place] on his phone to set reminder. Initial usual mod cues to activate Meta Hatchet; and to plan his request prior to activating. Repeated process of activating Siri until pt required rare min A; he then set a reminder to check laundry in one hour (min question cues for problem solving to figure out how much of a delay he would need to set to have the alarm go off when he was home from therapy).       Assessment / Recommendations / Plan   Plan  Continue with current plan of care      Progression Toward Goals   Progression toward goals  Progressing toward goals         SLP Short Term Goals - 01/15/19 0939      SLP SHORT TERM GOAL #1   Title  Pt will complete standardized assessment of cognition.    Status  Achieved      SLP SHORT TERM GOAL #2   Title  Pt will ID object/picture to simple description/feature/function f:4 with occasional min A over 3 sessions     Status  Partially Met      SLP SHORT TERM GOAL #3   Title  Pt will demo  auditory comprehension of 5 minutes simple-mod complex conversation by responding appropriately or asking questions over 2 sessions    Status  Achieved      SLP SHORT TERM GOAL #4   Title  Pt will name basic objects/pictures 7/10 correct with occasional mod A over 3 sessions     Status  Partially Met      SLP SHORT TERM GOAL #5   Title  Pt will demo sustained attention for 10 minutes in simple cognitive-linguistic task x3 sessions.    Status  Achieved      SLP SHORT TERM GOAL #6   Title  Pt will have a  memory system to assist in managing appointments, schedules, medical and therapy information and bring with him to 3 therapy sessions     Status  Achieved       SLP Long Term Goals - 01/15/19 0939      SLP LONG TERM GOAL #1   Title  Pt will write or name 5 words in personally relevant category with occasional min A over 4 sessions     Status  Partially Met      SLP LONG TERM GOAL #2   Title  Pt will demo error awareness by attempting correction or by nonverbal response to errors 75% of the time over 3 sessions    Status  Achieved      SLP LONG TERM GOAL #3   Title  Pt will participate functionally in 10 minutes simple-mod complex conversation with conversational supports for aphasia over 3 sessions.    Status  Achieved      SLP LONG TERM GOAL #4   Title  Pt will demo selective attention in min noisy environment for 10 minutes in a simple-mod complex cognitive linguistic task over three sessions    Status  Deferred      SLP LONG TERM GOAL #5   Title  Pt will utilize memory compensation system to recall details/manage appointments, schedules, medical and therapy information with rare min A over 4 sessions      Baseline  11/28/18, 12/23/18, 01/09/19    Time  1    Period  Weeks    Status  On-going      SLP LONG TERM GOAL #6   Title  Pt will demo WFL reading comprehension for high interest materials (texts, emails, news articles)by answering questions,summarizing material, or  composing responses x3 sessions.    Baseline  12/26/18, 12/30/18    Time  1    Period  Weeks    Status  On-going      SLP LONG TERM GOAL #7   Title  Pt will generate appropriate written or typed responses to emails and text messages >90% accuracy (compensations allowed) x 3 sessions    Baseline  12/30/18, 01/09/19    Time  1    Period  Weeks    Status  On-going      SLP LONG TERM GOAL #8   Title  Pt will participate functionally in 10 minutes mod complex conversation with conversational supports for aphasia over 3 sessions.    Baseline  12/30/18, 01/09/19    Time  1    Period  Weeks    Status  On-going       Plan - 01/15/19 0934    Clinical Impression Statement  Charles Marquez continues to present with aphasia and cognitive impairments, primarily memory. Mod complex conversation today is functional with extended time, supportive conversation strategies, although more time required last 2 sessions; recent head CT without significant findings. Headaches seem to impact pt's concentration. Today pt struggled with recall of recent events, due to not keeping up with his daily journal. Anxiety continues to play a role in pt's performance; suspect this hinders/slows his processing and response time. Support from family remains necessary for use of compensations at home; wife and children are providing appropriate activities and opportunities to practice cognitive and language skills at home. Anticipate d/c next session. Continue skilled ST to maximize communication and cognition for wants/needs, safety, independence and QOL.    Speech Therapy Frequency  2x / week    Duration  --  25 visits   Treatment/Interventions  Cognitive reorganization;Multimodal communcation approach;Environmental controls;Compensatory strategies;Language facilitation;Compensatory techniques;Cueing hierarchy;Internal/external aids;Functional tasks;SLP instruction and feedback;Patient/family education    Potential to Achieve Goals   Good    Potential Considerations  Severity of impairments       Patient will benefit from skilled therapeutic intervention in order to improve the following deficits and impairments:   Aphasia  Cognitive communication deficit    Problem List Patient Active Problem List   Diagnosis Date Noted  . Visual disturbance   . Slow transit constipation   . Hypoalbuminemia due to protein-calorie malnutrition ([place])   . Acute blood loss anemia   . S/P VP shunt   . Hydrocephalus ([place]) 08/30/2018  . Dyslipidemia   . History of CVA (cerebrovascular accident)   . Benign essential HTN   . Tachycardia   . Leukocytosis   . Hyponatremia   . Hypokalemia   . 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 Infection of ventricular shunt ([place]) 08/09/2018  . Bacterial meningitis 08/09/2018  . TIA (transient ischemic attack) 01/29/2017  . Acute encephalopathy   . Shunt malfunction 03/13/2016  . 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 Hyperlipidemia 10/20/2014  . Degenerative disc disease, lumbar 04/15/2013  . Routine general medical examination at a health care facility 07/23/2012  . DISTURBANCE OF SKIN SENSATION 10/05/2008  . HYPERLIPIDEMIA 01/01/2008  . MYCOPLASMA PNEUMONIA 01/01/2008   Deneise Lever, [place], Jermyn E  01/15/2019, 9:41 AM  [facility] [address] [place], [place], [address] Phone: [phone]   Fax:  [phone]   Name: Charles Marquez MRN: [id number] Date of Birth: 26-Sep-1956

## 2019-01-16 ENCOUNTER — Other Ambulatory Visit: Payer: Self-pay

## 2019-01-16 ENCOUNTER — Encounter (HOSPITAL_COMMUNITY): Payer: Self-pay | Admitting: *Deleted

## 2019-01-16 ENCOUNTER — Ambulatory Visit: Payer: BLUE CROSS/BLUE SHIELD | Admitting: Speech Pathology

## 2019-01-16 NOTE — Anesthesia Preprocedure Evaluation (Addendum)
Anesthesia Evaluation  Patient identified by MRN, date of birth, ID band Patient awake    Reviewed: Allergy & Precautions, NPO status , Patient's Chart, lab work & pertinent test results  Airway Mallampati: II  TM Distance: >3 FB     Dental   Pulmonary pneumonia,    breath sounds clear to auscultation       Cardiovascular hypertension,  Rhythm:Regular Rate:Normal     Neuro/Psych    GI/Hepatic Neg liver ROS,   Endo/Other    Renal/GU negative Renal ROS     Musculoskeletal  (+) Arthritis ,   Abdominal   Peds  Hematology  (+) anemia ,   Anesthesia Other Findings   Reproductive/Obstetrics                            Anesthesia Physical Anesthesia Plan  ASA: III  Anesthesia Plan: General   Post-op Pain Management:    Induction: Intravenous  PONV Risk Score and Plan: Ondansetron, Dexamethasone and Midazolam  Airway Management Planned: Oral ETT  Additional Equipment:   Intra-op Plan:   Post-operative Plan: Possible Post-op intubation/ventilation  Informed Consent:     Dental advisory given  Plan Discussed with: Anesthesiologist and CRNA  Anesthesia Plan Comments:        Anesthesia Quick Evaluation

## 2019-01-16 NOTE — Progress Notes (Signed)
SDW-Pre-op call completed by pt spouse, [name]. Spouse denies that pt C/O SOB and chest pain. Spouse stated that pt is under the care of Dr. Johney Frame, Cardiology. Spouse denies that pt had a stress test and cardiac cath. Spouse denies recent labs. Spouse stated that pt last dose of Plavix was " in January."  Spouse made aware to have pt stop taking vitamins, fish oil and herbal medications. Do not take any NSAIDs ie: Ibuprofen, Advil, Naproxen (Aleve), Motrin, BC and Goody Powder or any medication containing Aspirin. Spouse verbalized understanding of all pre-op instructions.

## 2019-01-17 ENCOUNTER — Inpatient Hospital Stay (HOSPITAL_COMMUNITY): Payer: BLUE CROSS/BLUE SHIELD | Admitting: Certified Registered Nurse Anesthetist

## 2019-01-17 ENCOUNTER — Observation Stay (HOSPITAL_COMMUNITY)
Admission: RE | Admit: 2019-01-17 | Discharge: 2019-01-17 | Disposition: A | Discharge status: Home / Self Care | Payer: BLUE CROSS/BLUE SHIELD | Attending: Neurosurgery | Admitting: Neurosurgery

## 2019-01-17 ENCOUNTER — Encounter (HOSPITAL_COMMUNITY): Payer: Self-pay

## 2019-01-17 ENCOUNTER — Encounter (HOSPITAL_COMMUNITY)
Admission: RE | Disposition: A | Discharge status: Home / Self Care | Payer: Self-pay | Source: Home / Self Care | Attending: Neurosurgery

## 2019-01-17 ENCOUNTER — Other Ambulatory Visit: Payer: Self-pay

## 2019-01-17 DIAGNOSIS — Y838 Other surgical procedures as the cause of abnormal reaction of the patient, or of later complication, without mention of misadventure at the time of the procedure: Secondary | ICD-10-CM | POA: Diagnosis not present

## 2019-01-17 DIAGNOSIS — T85618A Breakdown (mechanical) of other specified internal prosthetic devices, implants and grafts, initial encounter: Secondary | ICD-10-CM | POA: Diagnosis present

## 2019-01-17 DIAGNOSIS — T8502XA Displacement of ventricular intracranial (communicating) shunt, initial encounter: Secondary | ICD-10-CM | POA: Diagnosis not present

## 2019-01-17 DIAGNOSIS — Z7902 Long term (current) use of antithrombotics/antiplatelets: Secondary | ICD-10-CM | POA: Diagnosis not present

## 2019-01-17 DIAGNOSIS — Z982 Presence of cerebrospinal fluid drainage device: Secondary | ICD-10-CM | POA: Insufficient documentation

## 2019-01-17 DIAGNOSIS — Z8673 Personal history of transient ischemic attack (TIA), and cerebral infarction without residual deficits: Secondary | ICD-10-CM | POA: Insufficient documentation

## 2019-01-17 DIAGNOSIS — K660 Peritoneal adhesions (postprocedural) (postinfection): Secondary | ICD-10-CM | POA: Diagnosis not present

## 2019-01-17 DIAGNOSIS — I10 Essential (primary) hypertension: Secondary | ICD-10-CM | POA: Diagnosis not present

## 2019-01-17 DIAGNOSIS — E7800 Pure hypercholesterolemia, unspecified: Secondary | ICD-10-CM | POA: Diagnosis not present

## 2019-01-17 DIAGNOSIS — T8501XA Breakdown (mechanical) of ventricular intracranial (communicating) shunt, initial encounter: Secondary | ICD-10-CM | POA: Diagnosis present

## 2019-01-17 HISTORY — PX: LAPAROSCOPIC REVISION VENTRICULAR-PERITONEAL (V-P) SHUNT: SHX5924

## 2019-01-17 HISTORY — PX: SHUNT REVISION: SHX343

## 2019-01-17 LAB — CBC
HCT: 39.4 % (ref 39.0–52.0)
HEMOGLOBIN: 12.5 g/dL — AB (ref 13.0–17.0)
MCH: 28.4 pg (ref 26.0–34.0)
MCHC: 31.7 g/dL (ref 30.0–36.0)
MCV: 89.5 fL (ref 80.0–100.0)
Platelets: 192 10*3/uL (ref 150–400)
RBC: 4.4 MIL/uL (ref 4.22–5.81)
RDW: 14.9 % (ref 11.5–15.5)
WBC: 6.5 10*3/uL (ref 4.0–10.5)
nRBC: 0 % (ref 0.0–0.2)

## 2019-01-17 LAB — BASIC METABOLIC PANEL
Anion gap: 9 (ref 5–15)
BUN: 16 mg/dL (ref 8–23)
CO2: 24 mmol/L (ref 22–32)
Calcium: 9 mg/dL (ref 8.9–10.3)
Chloride: 104 mmol/L (ref 98–111)
Creatinine, Ser: 0.93 mg/dL (ref 0.61–1.24)
GFR calc Af Amer: >60 mL/min (ref >60)
Glucose, Bld: 95 mg/dL (ref 70–99)
Potassium: 3.9 mmol/L (ref 3.5–5.1)
Sodium: 137 mmol/L (ref 135–145)

## 2019-01-17 SURGERY — SHUNT REVISION
Anesthesia: General | Site: Abdomen

## 2019-01-17 MED ORDER — POLYVINYL ALCOHOL 1.4 % OP SOLN
1.0000 [drp] | OPHTHALMIC | Status: DC | PRN
  Filled 2019-01-17: qty 15

## 2019-01-17 MED ORDER — BUPIVACAINE-EPINEPHRINE 0.25% -1:200000 IJ SOLN
INTRAMUSCULAR | Status: DC | PRN
  Administered 2019-01-17: 1 mL

## 2019-01-17 MED ORDER — CHLORHEXIDINE GLUCONATE CLOTH 2 % EX PADS: 6.0000 | MEDICATED_PAD | Freq: Once | CUTANEOUS | Status: DC

## 2019-01-17 MED ORDER — ACETAMINOPHEN 325 MG PO TABS: 650.0000 mg | ORAL_TABLET | ORAL | Status: DC | PRN

## 2019-01-17 MED ORDER — DEXAMETHASONE SODIUM PHOSPHATE 10 MG/ML IJ SOLN
INTRAMUSCULAR | Status: AC
  Filled 2019-01-17: qty 2

## 2019-01-17 MED ORDER — CEFAZOLIN SODIUM-DEXTROSE 1-4 GM/50ML-% IV SOLN: 1.0000 g | Freq: Three times a day (TID) | INTRAVENOUS | Status: DC

## 2019-01-17 MED ORDER — SUGAMMADEX SODIUM 200 MG/2ML IV SOLN
INTRAVENOUS | Status: DC | PRN
  Administered 2019-01-17: 176 mg via INTRAVENOUS

## 2019-01-17 MED ORDER — MENTHOL 3 MG MT LOZG: 1.0000 | LOZENGE | OROMUCOSAL | Status: DC | PRN

## 2019-01-17 MED ORDER — ONDANSETRON HCL 4 MG PO TABS: 4.0000 mg | ORAL_TABLET | Freq: Four times a day (QID) | ORAL | Status: DC | PRN

## 2019-01-17 MED ORDER — FENTANYL CITRATE (PF) 250 MCG/5ML IJ SOLN
INTRAMUSCULAR | Status: AC
  Filled 2019-01-17: qty 5

## 2019-01-17 MED ORDER — LIDOCAINE 2% (20 MG/ML) 5 ML SYRINGE
INTRAMUSCULAR | Status: AC
  Filled 2019-01-17: qty 10

## 2019-01-17 MED ORDER — HYDROCODONE-ACETAMINOPHEN 10-325 MG PO TABS: 2.0000 | ORAL_TABLET | ORAL | Status: DC | PRN

## 2019-01-17 MED ORDER — PHENYLEPHRINE 40 MCG/ML (10ML) SYRINGE FOR IV PUSH (FOR BLOOD PRESSURE SUPPORT)
PREFILLED_SYRINGE | INTRAVENOUS | Status: AC
  Filled 2019-01-17: qty 20

## 2019-01-17 MED ORDER — KETOROLAC TROMETHAMINE 15 MG/ML IJ SOLN
30.0000 mg | Freq: Four times a day (QID) | INTRAMUSCULAR | Status: DC
  Administered 2019-01-17: 30 mg via INTRAVENOUS
  Filled 2019-01-17: qty 2

## 2019-01-17 MED ORDER — LACTATED RINGERS IV SOLN
INTRAVENOUS | Status: DC | PRN
  Administered 2019-01-17: 08:00:00 via INTRAVENOUS

## 2019-01-17 MED ORDER — FENTANYL CITRATE (PF) 100 MCG/2ML IJ SOLN: 25.0000 ug | INTRAMUSCULAR | Status: DC | PRN

## 2019-01-17 MED ORDER — 0.9 % SODIUM CHLORIDE (POUR BTL) OPTIME
TOPICAL | Status: DC | PRN
  Administered 2019-01-17: 1000 mL

## 2019-01-17 MED ORDER — PHENOL 1.4 % MT LIQD: 1.0000 | OROMUCOSAL | Status: DC | PRN

## 2019-01-17 MED ORDER — CYCLOBENZAPRINE HCL 10 MG PO TABS: 10.0000 mg | ORAL_TABLET | Freq: Three times a day (TID) | ORAL | Status: DC | PRN

## 2019-01-17 MED ORDER — HYDROCODONE-ACETAMINOPHEN 5-325 MG PO TABS
1.0000 | ORAL_TABLET | ORAL | Status: DC | PRN
  Administered 2019-01-17: 1 via ORAL
  Filled 2019-01-17: qty 1

## 2019-01-17 MED ORDER — LIDOCAINE-EPINEPHRINE 1 %-1:100000 IJ SOLN
INTRAMUSCULAR | Status: AC
  Filled 2019-01-17: qty 1

## 2019-01-17 MED ORDER — ROCURONIUM BROMIDE 50 MG/5ML IV SOSY
PREFILLED_SYRINGE | INTRAVENOUS | Status: AC
  Filled 2019-01-17: qty 10

## 2019-01-17 MED ORDER — DEXAMETHASONE SODIUM PHOSPHATE 10 MG/ML IJ SOLN
10.0000 mg | INTRAMUSCULAR | Status: AC
  Administered 2019-01-17: 10 mg via INTRAVENOUS
  Filled 2019-01-17: qty 1

## 2019-01-17 MED ORDER — ONDANSETRON HCL 4 MG/2ML IJ SOLN
INTRAMUSCULAR | Status: DC | PRN
  Administered 2019-01-17: 4 mg via INTRAVENOUS

## 2019-01-17 MED ORDER — HYPROMELLOSE (GONIOSCOPIC) 2.5 % OP SOLN
1.0000 [drp] | OPHTHALMIC | Status: DC | PRN
  Filled 2019-01-17: qty 15

## 2019-01-17 MED ORDER — ATORVASTATIN CALCIUM 40 MG PO TABS: 40.0000 mg | ORAL_TABLET | Freq: Every day | ORAL | Status: DC

## 2019-01-17 MED ORDER — FENTANYL CITRATE (PF) 100 MCG/2ML IJ SOLN
25.0000 ug | INTRAMUSCULAR | Status: DC | PRN
  Administered 2019-01-17: 50 ug via INTRAVENOUS

## 2019-01-17 MED ORDER — THROMBIN 5000 UNITS EX SOLR
CUTANEOUS | Status: AC
  Filled 2019-01-17: qty 15000

## 2019-01-17 MED ORDER — ACETAMINOPHEN 650 MG RE SUPP: 650.0000 mg | RECTAL | Status: DC | PRN

## 2019-01-17 MED ORDER — SODIUM CHLORIDE 0.9% FLUSH: 3.0000 mL | INTRAVENOUS | Status: DC | PRN

## 2019-01-17 MED ORDER — ROCURONIUM BROMIDE 50 MG/5ML IV SOSY
PREFILLED_SYRINGE | INTRAVENOUS | Status: AC
  Filled 2019-01-17: qty 5

## 2019-01-17 MED ORDER — MIDAZOLAM HCL 2 MG/2ML IJ SOLN
INTRAMUSCULAR | Status: AC
  Filled 2019-01-17: qty 2

## 2019-01-17 MED ORDER — HYDROMORPHONE HCL 1 MG/ML IJ SOLN: 1.0000 mg | INTRAMUSCULAR | Status: DC | PRN

## 2019-01-17 MED ORDER — BACITRACIN ZINC 500 UNIT/GM EX OINT
TOPICAL_OINTMENT | CUTANEOUS | Status: AC
  Filled 2019-01-17: qty 28.35

## 2019-01-17 MED ORDER — SODIUM CHLORIDE 0.9% FLUSH: 3.0000 mL | Freq: Two times a day (BID) | INTRAVENOUS | Status: DC

## 2019-01-17 MED ORDER — PROPOFOL 10 MG/ML IV BOLUS
INTRAVENOUS | Status: AC
  Filled 2019-01-17: qty 20

## 2019-01-17 MED ORDER — ONDANSETRON HCL 4 MG/2ML IJ SOLN
INTRAMUSCULAR | Status: AC
  Filled 2019-01-17: qty 4

## 2019-01-17 MED ORDER — SODIUM CHLORIDE 0.9 % IV SOLN: 250.0000 mL | INTRAVENOUS | Status: DC

## 2019-01-17 MED ORDER — FENTANYL CITRATE (PF) 100 MCG/2ML IJ SOLN
INTRAMUSCULAR | Status: DC | PRN
  Administered 2019-01-17: 25 ug via INTRAVENOUS
  Administered 2019-01-17: 100 ug via INTRAVENOUS

## 2019-01-17 MED ORDER — CEFAZOLIN SODIUM-DEXTROSE 2-4 GM/100ML-% IV SOLN
2.0000 g | INTRAVENOUS | Status: AC
  Administered 2019-01-17: 2 g via INTRAVENOUS
  Filled 2019-01-17: qty 100

## 2019-01-17 MED ORDER — PROPOFOL 10 MG/ML IV BOLUS
INTRAVENOUS | Status: DC | PRN
  Administered 2019-01-17: 20 mg via INTRAVENOUS
  Administered 2019-01-17: 180 mg via INTRAVENOUS

## 2019-01-17 MED ORDER — FENTANYL CITRATE (PF) 100 MCG/2ML IJ SOLN
INTRAMUSCULAR | Status: AC
  Filled 2019-01-17: qty 2

## 2019-01-17 MED ORDER — ONDANSETRON HCL 4 MG/2ML IJ SOLN: 4.0000 mg | Freq: Four times a day (QID) | INTRAMUSCULAR | Status: DC | PRN

## 2019-01-17 MED ORDER — ROCURONIUM BROMIDE 50 MG/5ML IV SOSY
PREFILLED_SYRINGE | INTRAVENOUS | Status: DC | PRN
  Administered 2019-01-17: 50 mg via INTRAVENOUS

## 2019-01-17 MED ORDER — SALINE SPRAY 0.65 % NA SOLN
1.0000 | NASAL | Status: DC | PRN
  Filled 2019-01-17: qty 44

## 2019-01-17 MED ORDER — LIDOCAINE 2% (20 MG/ML) 5 ML SYRINGE
INTRAMUSCULAR | Status: DC | PRN
  Administered 2019-01-17: 100 mg via INTRAVENOUS

## 2019-01-17 MED ORDER — BUPIVACAINE HCL (PF) 0.25 % IJ SOLN
INTRAMUSCULAR | Status: AC
  Filled 2019-01-17: qty 30

## 2019-01-17 MED ORDER — BUPIVACAINE-EPINEPHRINE (PF) 0.25% -1:200000 IJ SOLN
INTRAMUSCULAR | Status: AC
  Filled 2019-01-17: qty 30

## 2019-01-17 SURGICAL SUPPLY — 84 items
BAG DECANTER FOR FLEXI CONT (MISCELLANEOUS) IMPLANT
BANDAGE ADH SHEER 1  50/CT (GAUZE/BANDAGES/DRESSINGS) IMPLANT
BENZOIN TINCTURE PRP APPL 2/3 (GAUZE/BANDAGES/DRESSINGS) ×4 IMPLANT
BLADE SURG 11 STRL SS (BLADE) ×4 IMPLANT
BNDG GAUZE ELAST 4 BULKY (GAUZE/BANDAGES/DRESSINGS) IMPLANT
BUR ACORN 6.0 PRECISION (BURR) IMPLANT
BUR ACORN 6.0MM PRECISION (BURR)
CANISTER SUCT 3000ML PPV (MISCELLANEOUS) ×4 IMPLANT
CARTRIDGE OIL MAESTRO DRILL (MISCELLANEOUS) IMPLANT
CHLORAPREP W/TINT 26ML (MISCELLANEOUS) ×4 IMPLANT
CLIP RANEY DISP (INSTRUMENTS) IMPLANT
CLOSURE WOUND 1/2 X4 (GAUZE/BANDAGES/DRESSINGS) ×1
COVER WAND RF STERILE (DRAPES) IMPLANT
DERMABOND ADVANCED (GAUZE/BANDAGES/DRESSINGS) ×2
DERMABOND ADVANCED .7 DNX12 (GAUZE/BANDAGES/DRESSINGS) ×2 IMPLANT
DIFFUSER DRILL AIR PNEUMATIC (MISCELLANEOUS) IMPLANT
DRAPE INCISE IOBAN 85X60 (DRAPES) ×4 IMPLANT
DRAPE LAPAROSCOPIC ABDOMINAL (DRAPES) ×4 IMPLANT
DRAPE ORTHO SPLIT 77X108 STRL (DRAPES)
DRAPE SURG 17X23 STRL (DRAPES) IMPLANT
DRAPE SURG ORHT 6 SPLT 77X108 (DRAPES) IMPLANT
DRAPE UTILITY XL STRL (DRAPES) ×4 IMPLANT
DRSG OPSITE 4X5.5 SM (GAUZE/BANDAGES/DRESSINGS) IMPLANT
DRSG OPSITE POSTOP 3X4 (GAUZE/BANDAGES/DRESSINGS) ×12 IMPLANT
DRSG TEGADERM 2-3/8X2-3/4 SM (GAUZE/BANDAGES/DRESSINGS) IMPLANT
ELECT REM PT RETURN 9FT ADLT (ELECTROSURGICAL) ×4
ELECTRODE REM PT RTRN 9FT ADLT (ELECTROSURGICAL) ×2 IMPLANT
GAUZE SPONGE 2X2 8PLY STRL LF (GAUZE/BANDAGES/DRESSINGS) IMPLANT
GAUZE SPONGE 4X4 12PLY STRL (GAUZE/BANDAGES/DRESSINGS) IMPLANT
GLOVE BIOGEL PI IND STRL 8 (GLOVE) ×2 IMPLANT
GLOVE BIOGEL PI INDICATOR 8 (GLOVE) ×2
GLOVE ECLIPSE 8.0 STRL XLNG CF (GLOVE) ×4 IMPLANT
GLOVE ECLIPSE 9.0 STRL (GLOVE) ×8 IMPLANT
GLOVE EXAM NITRILE XL STR (GLOVE) IMPLANT
GOWN STRL REUS W/ TWL LRG LVL3 (GOWN DISPOSABLE) ×8 IMPLANT
GOWN STRL REUS W/ TWL XL LVL3 (GOWN DISPOSABLE) ×2 IMPLANT
GOWN STRL REUS W/TWL 2XL LVL3 (GOWN DISPOSABLE) IMPLANT
GOWN STRL REUS W/TWL LRG LVL3 (GOWN DISPOSABLE) ×8
GOWN STRL REUS W/TWL XL LVL3 (GOWN DISPOSABLE) ×2
HEMOSTAT SURGICEL 2X14 (HEMOSTASIS) IMPLANT
KIT BASIN OR (CUSTOM PROCEDURE TRAY) ×4 IMPLANT
KIT TURNOVER KIT B (KITS) ×4 IMPLANT
MARKER SKIN DUAL TIP RULER LAB (MISCELLANEOUS) ×4 IMPLANT
NS IRRIG 1000ML POUR BTL (IV SOLUTION) ×4 IMPLANT
OIL CARTRIDGE MAESTRO DRILL (MISCELLANEOUS)
PACK LAMINECTOMY NEURO (CUSTOM PROCEDURE TRAY) ×4 IMPLANT
PAD ARMBOARD 7.5X6 YLW CONV (MISCELLANEOUS) ×12 IMPLANT
PATTIES SURGICAL .5 X3 (DISPOSABLE) IMPLANT
RUBBERBAND STERILE (MISCELLANEOUS) IMPLANT
SCISSORS LAP 5X35 DISP (ENDOMECHANICALS) ×4 IMPLANT
SET IRRIG TUBING LAPAROSCOPIC (IRRIGATION / IRRIGATOR) IMPLANT
SHEATH COOK PEEL AWAY SET 9F (SHEATH) ×4 IMPLANT
SHEATH PERITONEAL INTRO 46 (MISCELLANEOUS) IMPLANT
SHEATH PERITONEAL INTRO 61 (MISCELLANEOUS) IMPLANT
SLEEVE ENDOPATH XCEL 5M (ENDOMECHANICALS) ×8 IMPLANT
SPONGE GAUZE 2X2 STER 10/PKG (GAUZE/BANDAGES/DRESSINGS)
SPONGE INTESTINAL PEANUT (DISPOSABLE) IMPLANT
SPONGE LAP 4X18 RFD (DISPOSABLE) ×8 IMPLANT
SPONGE SURGIFOAM ABS GEL SZ50 (HEMOSTASIS) IMPLANT
STAPLER VISISTAT 35W (STAPLE) IMPLANT
STRIP CLOSURE SKIN 1/2X4 (GAUZE/BANDAGES/DRESSINGS) ×3 IMPLANT
SUT CHROMIC 3 0 SH 27 (SUTURE) IMPLANT
SUT ETHILON 3 0 FSL (SUTURE) IMPLANT
SUT ETHILON 4 0 PS 2 18 (SUTURE) IMPLANT
SUT MON AB 4-0 PC3 18 (SUTURE) ×4 IMPLANT
SUT NURALON 4 0 TR CR/8 (SUTURE) IMPLANT
SUT SILK 0 TIES 10X30 (SUTURE) IMPLANT
SUT SILK 2 0 TIES 17X18 (SUTURE)
SUT SILK 2-0 18XBRD TIE BLK (SUTURE) IMPLANT
SUT SILK 3 0 SH 30 (SUTURE) IMPLANT
SUT VIC AB 2-0 CP2 18 (SUTURE) ×4 IMPLANT
SUT VIC AB 2-0 CT2 18 VCP726D (SUTURE) IMPLANT
SUT VIC AB 3-0 SH 8-18 (SUTURE) ×4 IMPLANT
SUT VICRYL 4-0 PS2 18IN ABS (SUTURE) IMPLANT
SYR 5ML LL (SYRINGE) IMPLANT
TOWEL GREEN STERILE (TOWEL DISPOSABLE) ×4 IMPLANT
TOWEL GREEN STERILE FF (TOWEL DISPOSABLE) ×4 IMPLANT
TRAY FOLEY MTR SLVR 16FR STAT (SET/KITS/TRAYS/PACK) IMPLANT
TRAY LAPAROSCOPIC MC (CUSTOM PROCEDURE TRAY) ×4 IMPLANT
TROCAR XCEL BLUNT TIP 100MML (ENDOMECHANICALS) IMPLANT
TROCAR XCEL NON-BLD 5MMX100MML (ENDOMECHANICALS) IMPLANT
TUBING INSUFFLATION (TUBING) ×4 IMPLANT
UNDERPAD 30X30 (UNDERPADS AND DIAPERS) IMPLANT
WATER STERILE IRR 1000ML POUR (IV SOLUTION) ×4 IMPLANT

## 2019-01-17 NOTE — Op Note (Addendum)
Date of procedure: 01/17/2019  Date of dictation: Same  Service: Neurosurgery  Preoperative diagnosis: Distal VP shunt malfunction  Postoperative diagnosis: Same  Procedure Name: Reexploration of abdominal wound with VP shunt revision.  Surgeon:Micajah Dennin A.Adalyn Pennock, M.D.   Co-Surgeon:  Tsuei  Asst. Surgeon: None  Anesthesia: General  Indication: 63 year old male status post multiple VP shunt surgeries presents with worsening headache and neurocognitive decline.  Work-up demonstrates evidence of coiling of his distal VP shunt catheter in the preperitoneal space.  Patient presents now for repositioning of his VP shunt catheter in hopes of improving his symptoms.  Operative note: After induction of anesthesia, patient position supine.  Abdomen was prepped and draped sterilely.  I reopened the patient's left upper quadrant wound.  Dissected free the shunt catheter.  This was removed from the preperitoneal space.  The catheter was noted to be draining CSF well.  There is no evidence of obvious infection or other complication.  Dr. Corliss Skains of the general surgery department then introduced a insufflating port into the right upper quadrant and a working port into the right lower quadrant.  Adhesions were taken down in the abdomen by him.  Then using the camera for visualization a hemostat was passed through the left upper quadrant wound and piercing into the peritoneal space.  A peel-away catheter was then passed through the hemostat tract and then the VP shunt catheter was passed into the peritoneal space.  This was then grasped by Dr. Corliss Skains and positioned in the lower abdomen/pelvis.  Wounds were both closed in a typical fashion.  There is no evidence of complication.  Patient tolerated procedure well and he returns to recovery room postop.

## 2019-01-17 NOTE — Consult Note (Signed)
Assistance requested by Dr. Jordan Likes for laparoscopic manipulation/ revision of VP shunt.  Last laparoscopic revision performed by Dr. Derrell Lolling 08/26/18.  He describes some adhesions in the midline and LUQ.  The catheter is tunneled down the left side and inserted in the LUQ.  The catheter was placed into the pelvis in the LLQ.  However, plain films on 11/05/18 showed the tip of the shunt in the right mid-abdomen.  The peritoneal catheter was replaced by Dr. Jordan Likes on 12/09/18 without laparoscopic guidance.  Apparently, the system is not functioning correctly, so Dr. Jordan Likes is planning another revision.  We are asked to help manipulate the peritoneal catheter laparoscopically.  I discussed my portion of the case with the patient and his wife.  The surgical procedure has been discussed with the patient. All of the patient's questions at this time have been answered.  The likelihood of reaching the patient's treatment goal is good.  The patient understand the proposed surgical procedure and wishes to proceed.   Charles Marquez. Corliss Skains, MD, [facility] Surgery  General/ Trauma Surgery Beeper [phone]  01/17/2019 7:27 AM

## 2019-01-17 NOTE — H&P (Signed)
Charles Marquez is an 63 y.o. male.   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  Patient status post a number of shunt revision since that time.  Most recent shunt revision was approximately 5 weeks ago.  Patient presents now with increasing headaches some word finding difficulty and decreased cognitive performance.  Work-up demonstrates evidence of coiling of his distal catheter in the preperitoneal tissue.  Patient presents now for repositioning of his peritoneal catheter.  Past Medical History:  Diagnosis Date  . Anxiety   . Hypercholesteremia   . Stroke ([facility])    tia's  . TIA (transient ischemic attack)    09.15    Past Surgical History:  Procedure Laterality Date  . Fractured arm Left 12  . HERNIA REPAIR Right 3/12  . LAPAROSCOPIC REVISION VENTRICULAR-PERITONEAL (V-P) SHUNT N/A 08/26/2018   Procedure: LAPAROSCOPIC INSERTION VENTRICULAR-PERITONEAL (V-P) SHUNT;  Surgeon: Julio Sicks, MD;  [facility];  Service: Neurosurgery;  Laterality: N/A;  . LOOP RECORDER INSERTION N/A 04/10/2017   Procedure: Loop Recorder Insertion;  Surgeon: Hillis Range, MD;  [facility];  Service: Cardiovascular;  Laterality: N/A;  . SHUNT REMOVAL Right 03/13/2016   Procedure: SHUNT REMOVAL;  Surgeon: Julio Sicks, MD;  [facility];  Service: Neurosurgery;  Laterality: Right;  . SHUNT REMOVAL Right 08/09/2018   Procedure: SHUNT REMOVAL With Placement of Ventricular Catheter;  Surgeon: Lisbeth Renshaw, MD;  [facility];  Service: Neurosurgery;  Laterality: Right;  . SHUNT REVISION Right 08/05/2018   Procedure: SHUNT REVISION;  Surgeon: Julio Sicks, MD;  Location: [facility] OR;  Service: Neurosurgery;  Laterality: Right;  . SHUNT REVISION Left 12/06/2018   Procedure: Shunt Revision - left;  Surgeon: Julio Sicks, MD;  Location: [facility] OR;  Service: Neurosurgery;   Laterality: Left;  Shunt Revision - left  . SHUNT REVISION N/A 12/09/2018   Procedure: SHUNT REVISION;  Surgeon: Julio Sicks, MD;  Location: [facility] OR;  Service: Neurosurgery;  Laterality: N/A;  . SHUNT REVISION VENTRICULAR-PERITONEAL Left 08/26/2018   Procedure: SHUNT REVISION VENTRICULAR-PERITONEAL;  Surgeon: Julio Sicks, MD;  [facility];  Service: Neurosurgery;  Laterality: Left;  . SHUNT REVISION VENTRICULAR-PERITONEAL Left 09/02/2018   Procedure: Left Occipital VP shunt revision;  Surgeon: Julio Sicks, MD;  Location: [facility] OR;  Service: Neurosurgery;  Laterality: Left;  [name] VASECTOMY  10/02/1997  . 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 VENTRICULOPERITONEAL SHUNT Right 07/22/2018   Procedure: Shunt Placment right occipital;  Surgeon: Julio Sicks, MD;  Location: [facility] OR;  Service: Neurosurgery;  Laterality: Right;  . VENTRICULOPERITONEAL SHUNT Left 08/26/2018   Procedure: LEFT SIDED VENTRICULAR-PERITONEAL SHUNT;  Surgeon: Julio Sicks, MD;  Location: [facility] OR;  Service: Neurosurgery;  Laterality: Left;  [name] VENTRICULOSTOMY Right 08/09/2018   Procedure: VENTRICULOSTOMY;  Surgeon: Lisbeth Renshaw, MD;  Location: [facility] OR;  Service: Neurosurgery;  Laterality: Right;    Family History  Problem Relation Age of Onset  . COPD Mother   . Lung cancer Father   . Alzheimer's disease Father    Social History:  reports that he has never smoked. He has never used smokeless tobacco. He reports current alcohol use. He reports that he does not use drugs.  Allergies: No Known Allergies  Medications Prior to Admission  Medication Sig Dispense Refill  . acetaminophen (TYLENOL) 325 MG tablet Take 2 tablets (650 mg total) by mouth every 4 (four) hours as needed for mild pain (temp > 100.5).    [name] atorvastatin (LIPITOR)  40 MG tablet Take 1 tablet (40 mg total) by mouth daily. 90 tablet 3  . Cetirizine HCl (ZYRTEC PO) Take 10 mg by mouth daily as needed (seasonal allergies).     . clopidogrel (PLAVIX) 75 MG tablet Take 75 mg by mouth daily.    . hydroxypropyl methylcellulose / hypromellose (ISOPTO TEARS / GONIOVISC) 2.5 % ophthalmic solution Place 1 drop into both eyes as needed for dry eyes.    . sodium chloride (OCEAN) 0.65 % SOLN nasal spray Place 1 spray into both nostrils as needed for congestion.      Results for orders placed or performed during the hospital encounter of 01/17/19 (from the past 48 hour(s))  CBC     Status: Abnormal   Collection Time: 01/17/19  6:32 AM  Result Value Ref Range   WBC 6.5 4.0 - 10.5 K/uL   RBC 4.40 4.22 - 5.81 MIL/uL   Hemoglobin 12.5 (L) 13.0 - 17.0 g/dL   HCT 32.1 22.4 - 82.5 %   MCV 89.5 80.0 - 100.0 fL   MCH 28.4 26.0 - 34.0 pg   MCHC 31.7 30.0 - 36.0 g/dL   RDW 00.3 70.4 - 88.8 %   Platelets 192 150 - 400 K/uL   nRBC 0.0 0.0 - 0.2 %    Comment: Performed at [facility], [address] [place], [place] [address]  Basic metabolic panel     Status: None   Collection Time: 01/17/19  6:32 AM  Result Value Ref Range   Sodium 137 135 - 145 mmol/L   Potassium 3.9 3.5 - 5.1 mmol/L   Chloride 104 98 - 111 mmol/L   CO2 24 22 - 32 mmol/L   Glucose, Bld 95 70 - 99 mg/dL   BUN 16 8 - 23 mg/dL   Creatinine, Ser 5.03 0.61 - 1.24 mg/dL   Calcium 9.0 8.9 - 88.8 mg/dL   GFR calc non Af Amer >60 >60 mL/min   GFR calc Af Amer >60 >60 mL/min   Anion gap 9 5 - 15    Comment: Performed at [facility], [address] [place], [place] [address]   No results found.  Pertinent items noted in HPI and remainder of comprehensive ROS otherwise negative.  Blood pressure 135/90, pulse 60, temperature 97.9 F (36.6 C), temperature source Oral, resp. rate 17, height 5\' 11"  (1.803 m), weight 88 kg, SpO2 96 %.  Patient is awake and alert.  He is oriented to person  place and time.  His short-term and long-term recall are poor.  His speech is blunted.  Examination head ears eyes nose throat demonstrates good healing of his wound.  Motor and sensory function extremities is reasonably normal.  Abdominal wound with some fluid collection consistent with trapped CSF.  No obvious hernia.  Abdomen otherwise nontender and nondistended.  Chest clear. Assessment/Plan VP shunt malfunction secondary to poor positioning of the peritoneal catheter.  Plan for reexploration of abdominal wound with laparoscopically assisted placement of his VP shunt catheter.  Risks and benefits been explained.  Patient wishes to proceed.  Sherilyn Cooter A Demir [place] 01/17/2019, 7:35 AM

## 2019-01-17 NOTE — Transfer of Care (Signed)
Immediate Anesthesia Transfer of Care Note  Patient: Charles Marquez  Procedure(s) Performed: Shunt Revision - left (Left Abdomen) LAPAROSCOPIC REVISION VENTRICULAR-PERITONEAL (V-P) SHUNT (N/A Abdomen)  Patient Location: PACU  Anesthesia Type:General  Level of Consciousness: awake, alert , patient cooperative and responds to stimulation  Airway & Oxygen Therapy: Patient Spontanous Breathing and Patient connected to face mask oxygen  Post-op Assessment: Report given to RN, Post -op Vital signs reviewed and stable and Patient moving all extremities X 4  Post vital signs: Reviewed and stable  Last Vitals:  Vitals Value Taken Time  BP 139/88 01/17/2019  8:44 AM  Temp    Pulse 58 01/17/2019  8:45 AM  Resp 18 01/17/2019  8:45 AM  SpO2 100 % 01/17/2019  8:45 AM  Vitals shown include unvalidated device data.  Last Pain:  Vitals:   01/17/19 0600  TempSrc: Oral  PainSc: 0-No pain      Patients Stated Pain Goal: 3 (01/17/19 0600)  Complications: No apparent anesthesia complications

## 2019-01-17 NOTE — Discharge Summary (Signed)
Physician Discharge Summary  Patient ID: Charles Marquez MRN: [id number] DOB/AGE: 1956/09/22 63 y.o.  Admit date: 01/17/2019 Discharge date: 01/17/2019  Admission Diagnoses:  Discharge Diagnoses:  Active Problems:   Shunt malfunction   Discharged Condition: good  Hospital Course: Patient admitted to the hospital where he underwent uncomplicated laparoscopically assisted VP shunt revision.  Postoperatively doing well.  No headache.  No abdominal pain.  Mobilizing without difficulty.  Ready for discharge home.  Consults:   Significant Diagnostic Studies:   Treatments:   Discharge Exam: Blood pressure (!) 145/88, pulse (!) 56, temperature 97.6 F (36.4 C), temperature source Oral, resp. rate 18, height 5\' 11"  (1.803 m), weight 88 kg, SpO2 97 %. Awake and alert.  Oriented and appropriate.  Cranial nerve function stable.  Motor and sensory function of the extremities intact.  Wounds clean and dry.  Abdomen soft.  Chest clear.  Disposition: Discharge disposition: 01-Home or Self Care        Allergies as of 01/17/2019   No Known Allergies     Medication List    TAKE these medications   acetaminophen 325 MG tablet Commonly known as:  TYLENOL Take 2 tablets (650 mg total) by mouth every 4 (four) hours as needed for mild pain (temp > 100.5).   atorvastatin 40 MG tablet Commonly known as:  LIPITOR Take 1 tablet (40 mg total) by mouth daily.   clopidogrel 75 MG tablet Commonly known as:  PLAVIX Take 75 mg by mouth daily.   hydroxypropyl methylcellulose / hypromellose 2.5 % ophthalmic solution Commonly known as:  ISOPTO TEARS / GONIOVISC Place 1 drop into both eyes as needed for dry eyes.   sodium chloride 0.65 % Soln nasal spray Commonly known as:  OCEAN Place 1 spray into both nostrils as needed for congestion.   ZYRTEC PO Take 10 mg by mouth daily as needed (seasonal allergies).        Signed: Kathaleen Maser Kyilee Gregg 01/17/2019, 2:23 PM

## 2019-01-17 NOTE — Anesthesia Procedure Notes (Signed)
Procedure Name: Intubation Date/Time: 01/17/2019 7:52 AM Performed by: Glynda Jaeger, CRNA Pre-anesthesia Checklist: Patient identified, Patient being monitored, Timeout performed, Emergency Drugs available and Suction available Patient Re-evaluated:Patient Re-evaluated prior to induction Oxygen Delivery Method: Circle System Utilized Preoxygenation: Pre-oxygenation with 100% oxygen Induction Type: IV induction Ventilation: Mask ventilation without difficulty Laryngoscope Size: Mac and 4 Grade View: Grade II Tube type: Oral Tube size: 7.5 mm Number of attempts: 1 Airway Equipment and Method: Stylet Placement Confirmation: ETT inserted through vocal cords under direct vision,  positive ETCO2 and breath sounds checked- equal and bilateral Secured at: 21 cm Tube secured with: Tape Dental Injury: Teeth and Oropharynx as per pre-operative assessment

## 2019-01-17 NOTE — Brief Op Note (Signed)
01/17/2019  8:31 AM  PATIENT:  Charles Marquez  63 y.o. male  PRE-OPERATIVE DIAGNOSIS:  Shunt Malformation  POST-OPERATIVE DIAGNOSIS:  Shunt Malformation  PROCEDURE:  Procedure(s): Shunt Revision - left (Left) LAPAROSCOPIC REVISION VENTRICULAR-PERITONEAL (V-P) SHUNT (N/A)  SURGEON:  Surgeon(s) and Role: Panel 1:    * Julio Sicks, MD - Primary    * Manus Rudd, MD - Assisting Panel 2:    * Manus Rudd, MD - Primary  PHYSICIAN ASSISTANT:   ASSISTANTS:    ANESTHESIA:   general  EBL:  Minimal    BLOOD ADMINISTERED:none  DRAINS: none   LOCAL MEDICATIONS USED:  LIDOCAINE   SPECIMEN:  No Specimen  DISPOSITION OF SPECIMEN:  N/A  COUNTS:  YES  TOURNIQUET:  * No tourniquets in log *  DICTATION: .Dragon Dictation  PLAN OF CARE: Admit for overnight observation  PATIENT DISPOSITION:  PACU - hemodynamically stable.   Delay start of Pharmacological VTE agent (>24hrs) due to surgical blood loss or risk of bleeding: yes

## 2019-01-17 NOTE — Progress Notes (Signed)
Patient alert and oriented, mae's well, voiding adequate amount of urine, swallowing without difficulty, no c/o pain at time of discharge. Patient discharged home with family. Script and discharged instructions given to patient. Patient and family stated understanding of instructions given. Patient has an appointment with Dr. Pool  

## 2019-01-17 NOTE — Discharge Instructions (Addendum)
Wound Care Keep incision covered and dry for two days and shower. Do not put any creams, lotions, or ointments on incision.  Activity Walk each and every day, increasing distance each day. No lifting greater than 5 lbs.   Diet Resume your normal diet.   Call Your Doctor If Any of These Occur Redness, drainage, or swelling at the wound.  Temperature greater than 101 degrees. Severe pain not relieved by pain medication. Incision starts to come apart. Follow Up Appt Call today for appointment [phone]) or for problems.  If you have any hardware placed in your spine, you will need an x-ray before your appointment.

## 2019-01-17 NOTE — Op Note (Signed)
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  He has had multiple revisions.Charles Marquez  He presents now for further revision.  Dr. Jordan Likes will dictate his portion of the case.  Description of procedure:  The patient was brought to the operating room and placed in the supine position on the operating room table.  After an adequate level of general anesthesia was obtained, his abdomen was shaved, prepped with ChloraPrep and draped sterile fashion.  A timeout was taken to ensure the proper patient and proper procedure.  Dr. Jordan Likes began his portion of the case by exploring the peritoneal insertion site in the left upper quadrant.  A 5 mm incision was made in the right subcostal region.  A 5 mm Optiview port was then inserted and used to cannulate the peritoneal cavity.  We insufflated CO2 maintaining a maximum pressure of 15 mmHg.  The laparoscope was inserted.  There is no injury to any of the intra-abdominal viscera.  There are a lot of adhesions to the anterior abdominal wall involving the omentum.  A 5 mm port was placed in the right lower quadrant.  We then used blunt dissection as well as cautery scissors to take down the omental adhesions from the anterior abdominal wall.  We left some of the adhesions over the dome of the liver.  However the remainder of the adhesions were taken down.  Dr. Jordan Likes then reinserted the peritoneal portion of the VP shunt.  I grasped this with a clamp and brought it down to the pelvis.  This is laying anterior to all the viscera.  The seem to be functioning normally.  We then released our insufflation.  The ports were removed.  Both incisions were closed with 4-0 Monocryl and Dermabond.  The patient was then extubated and brought to  the recovery room in stable condition.  All sponge, instrument, and needle counts are correct.  Wilmon Arms. Corliss Skains, MD, [facility] Surgery  General/ Trauma Surgery Beeper [phone]  01/17/2019 8:35 AM

## 2019-01-17 NOTE — Anesthesia Postprocedure Evaluation (Signed)
Anesthesia Post Note  Patient: Charles Marquez  Procedure(s) Performed: Shunt Revision - left (Left Abdomen) LAPAROSCOPIC REVISION VENTRICULAR-PERITONEAL (V-P) SHUNT (N/A Abdomen)     Patient location during evaluation: PACU Anesthesia Type: General Level of consciousness: awake Pain management: pain level controlled Vital Signs Assessment: post-procedure vital signs reviewed and stable Respiratory status: spontaneous breathing Cardiovascular status: stable Postop Assessment: no apparent nausea or vomiting Anesthetic complications: no    Last Vitals:  Vitals:   01/17/19 0913 01/17/19 1030  BP: 115/87 (!) 145/88  Pulse: 76 (!) 56  Resp: (!) 9 18  Temp:  36.4 C  SpO2: 90% 97%    Last Pain:  Vitals:   01/17/19 1030  TempSrc: Oral  PainSc:                  Shaan Rhoads

## 2019-01-18 ENCOUNTER — Encounter (HOSPITAL_COMMUNITY): Payer: Self-pay | Admitting: Neurosurgery

## 2019-01-22 ENCOUNTER — Ambulatory Visit: Payer: BLUE CROSS/BLUE SHIELD | Admitting: Physical Therapy

## 2019-01-28 ENCOUNTER — Other Ambulatory Visit: Payer: Self-pay

## 2019-01-28 ENCOUNTER — Encounter: Payer: Self-pay | Admitting: Psychology

## 2019-01-28 ENCOUNTER — Encounter: Payer: BLUE CROSS/BLUE SHIELD | Attending: Psychology | Admitting: Psychology

## 2019-01-28 DIAGNOSIS — F4310 Post-traumatic stress disorder, unspecified: Secondary | ICD-10-CM | POA: Diagnosis not present

## 2019-01-28 DIAGNOSIS — G3189 Other specified degenerative diseases of nervous system: Secondary | ICD-10-CM | POA: Insufficient documentation

## 2019-01-28 DIAGNOSIS — F09 Unspecified mental disorder due to known physiological condition: Secondary | ICD-10-CM | POA: Diagnosis not present

## 2019-01-28 DIAGNOSIS — S069X0S Unspecified intracranial injury without loss of consciousness, sequela: Secondary | ICD-10-CM | POA: Diagnosis present

## 2019-01-28 DIAGNOSIS — S069XAS Unspecified intracranial injury with loss of consciousness status unknown, sequela: Secondary | ICD-10-CM

## 2019-01-28 NOTE — Progress Notes (Signed)
Patient:  Charles Marquez   DOB: June 06, 1956  MR Number: [id number]  Location: [facility] FOR PAIN AND REHABILITATIVE MEDICINE [facility] PHYSICAL MEDICINE AND REHABILITATION [address] [place], [address] [id number] [facility] [place] [address] Dept: [phone]  Start: 8 AM End: 9 AM  Provider/Observer:     Hershal Coria PsyD  Chief Complaint:      Chief Complaint  Patient presents with  . Pain  . Memory Loss  . Post-Traumatic Stress Disorder  . Anxiety    Reason For Service:     Charles Marquez is a 63 year old male referred for neuropsychological consultation due to coping and adjustment issues following significant issues from difficulties with his shunt.  The patient has had ongoing issues with aphasia, gait disturbance, visual-spatial deficits.  The patient's wife reports that there have been multiple shunt infections and revisions.  While the patient's expressive language abilities and memory abilities have improved there continue to be issues with memory, expressive language, sequencing and organization, gait disturbances and double vision.  Interventions Strategy:  Therapeutic interventions to build on coping and adjustment issues following significant neurological deficits due to malfunctions and repeatedly developing infections with his shunt.  Participation Level:   Active  Participation Quality:  Appropriate      Behavioral Observation:  Well Groomed, Alert, and Appropriate.   Current Psychosocial Factors: The patient and his wife both report that there have been a lot of stressors related to recent revisions of his shunt that have happened since my last visit in January.  They report that the patient is having difficulty with gait and memory as well as having ongoing issues with coping with the loss of function.  Content of Session:   Reviewed current symptoms and continue to work on therapeutic interventions around adjusting to residual neurological  deficits following development of hydrocephalus and multiple revisions of his shunt.  Current Status:   The patient reports that he has had a lot of difficulties recently and has had to vision since I saw him in January.  Patient Progress:   Recent worsening because of repeated infection.  Impression/Diagnosis:   Charles Marquez is a 63 year old male with a history of hyperlipidemia, CVA with loop recorder insertion, right occipital VP shunt in 2016.  The patient lives with his wife.  The patient had been working for [employer].  The patient had an admission on 08/04/2018 for unsteadiness of gait, nausea and vomiting, findings of malfunction VP shunt with revision 08/05/2018.  The patient was discharged home but was readmitted on 08/08/2018-08/22/2018 for VP shunt infection.  Once this is cleared up he again underwent removal of VP shunt.  The patient continued showed later mildly enlarged right lateral ventricle, but left ventricle third ventricle and fourth ventricle were small.  Later around 08/30/2018 there was suspected VP shunt malfunction.  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  The patient had follow-up care through the inpatient rehabilitation services at [facility].  The patient has been going through physical and occupational therapies as well as being followed up by Dr. Allena Katz.  The patient has shown some significant improvement in his aphasia, memory, motor functioning, and other cognitive functioning.  The patient is able to walk now and has been aggressively working on balance.  The patient worked for years with [employer] before going to work for [employer].  He had only worked there for 3 weeks when he had his first shot in 2016.  He was taken out of work and then did try to return to work but is not working now.  Current goals include learning to adapt to new life before all of these neurological difficulties.  The patient reports that he has  been very active but there is been a huge change in his abilities as these cerebrovascular accident.  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

## 2019-01-29 ENCOUNTER — Ambulatory Visit: Payer: BLUE CROSS/BLUE SHIELD | Admitting: Physical Therapy

## 2019-02-03 ENCOUNTER — Ambulatory Visit: Payer: BLUE CROSS/BLUE SHIELD | Admitting: Psychology

## 2019-02-05 ENCOUNTER — Ambulatory Visit: Payer: BLUE CROSS/BLUE SHIELD | Admitting: Physical Therapy

## 2019-02-10 ENCOUNTER — Other Ambulatory Visit: Payer: Self-pay

## 2019-02-10 ENCOUNTER — Encounter (HOSPITAL_BASED_OUTPATIENT_CLINIC_OR_DEPARTMENT_OTHER): Payer: BLUE CROSS/BLUE SHIELD | Admitting: Psychology

## 2019-02-10 ENCOUNTER — Encounter: Payer: Self-pay | Admitting: Psychology

## 2019-02-10 DIAGNOSIS — G3189 Other specified degenerative diseases of nervous system: Secondary | ICD-10-CM | POA: Diagnosis not present

## 2019-02-10 DIAGNOSIS — F4310 Post-traumatic stress disorder, unspecified: Secondary | ICD-10-CM

## 2019-02-10 DIAGNOSIS — F09 Unspecified mental disorder due to known physiological condition: Secondary | ICD-10-CM

## 2019-02-10 DIAGNOSIS — S069X0S Unspecified intracranial injury without loss of consciousness, sequela: Secondary | ICD-10-CM

## 2019-02-10 NOTE — Progress Notes (Signed)
Patient:  Charles Marquez   DOB: 1955/12/05  MR Number: [id number]  Location: [facility] FOR PAIN AND REHABILITATIVE MEDICINE [facility] PHYSICAL MEDICINE AND REHABILITATION [address] [place], [address] [id number] [facility] [place] [address] Dept: [phone]  Start: 4 PM End: 5 PM  Provider/Observer:     Hershal Coria PsyD  Chief Complaint:      Chief Complaint  Patient presents with  . Anxiety  . Pain  . Memory Loss  . Post-Traumatic Stress Disorder    Reason For Service:     Charles Marquez is a 63 year old male referred for neuropsychological consultation due to coping and adjustment issues following significant issues from difficulties with his shunt.  The patient has had ongoing issues with aphasia, gait disturbance, visual-spatial deficits.  The patient's wife reports that there have been multiple shunt infections and revisions.  While the patient's expressive language abilities and memory abilities have improved there continue to be issues with memory, expressive language, sequencing and organization, gait disturbances and double vision.  The above reason for service has been reviewed prior to this appointment and remains applicable and appropriate for this visit.  The patient has continued to struggle with understanding how his cognitive and physical changes are impacting his overall life.    Interventions Strategy:  Therapeutic interventions to build on coping and adjustment issues following significant neurological deficits due to malfunction and repeatedly developing infections in his shunt.   Participation Level:   Active  Participation Quality:  Appropriate      Behavioral Observation:  Well Groomed, Alert, and Appropriate.   Current Psychosocial Factors: The patient reports that he went to the [facility] with his family, which is something that they have regularly done in the past.  The patient reports that initially he was hesitant to doing this  worrying that he would keep his family from having a good time.  While the patient did have some difficulties having to do with balance on a floating dock on the boat they were able to adjust to this and the patient reports that he actually ended up having a good time and is glad that he did this.  Content of Session:   Reviewed current symptoms and continue to work on therapeutic interventions around adjusting to residual neurological deficits following development of hydrocephalus and multiple revisions of his shunt.  Current Status:   The patient reports that he has been more active recently and doing things with his family.  He is trying to adjust to the significant sudden loss of work and other functions.  Patient Progress:   The patient has made significant gains over the past couple of weeks and has been more engaged and active.  Impression/Diagnosis:   Charles Marquez is a 63 year old male with a history of hyperlipidemia, CVA with loop recorder insertion, right occipital VP shunt in 2016.  The patient lives with his wife.  The patient had been working for [employer].  The patient had an admission on 08/04/2018 for unsteadiness of gait, nausea and vomiting, findings of malfunction VP shunt with revision 08/05/2018.  The patient was discharged home but was readmitted on 08/08/2018-08/22/2018 for VP shunt infection.  Once this is cleared up he again underwent removal of VP shunt.  The patient continued showed later mildly enlarged right lateral ventricle, but left ventricle third ventricle and fourth ventricle were small.  Later around 08/30/2018 there was suspected VP shunt malfunction.  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  The patient had follow-up care through the inpatient rehabilitation services at [facility].  The patient has been going through physical and occupational therapies as well as being followed up by Dr. Allena Katz.  The patient has shown  some significant improvement in his aphasia, memory, motor functioning, and other cognitive functioning.  The patient is able to walk now and has been aggressively working on balance.  The patient worked for years with [employer] before going to work for [employer].  He had only worked there for 3 weeks when he had his first shot in 2016.  He was taken out of work and then did try to return to work but is not working now.  Current goals include learning to adapt to new life before all of these neurological difficulties.  The patient reports that he has been very active but there is been a huge change in his abilities as these cerebrovascular accident.  he patient reports that he has been more active recently and doing things with his family.  He is trying to adjust to the significant sudden loss of work and other functions.  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

## 2019-02-12 ENCOUNTER — Ambulatory Visit: Payer: BLUE CROSS/BLUE SHIELD | Admitting: Physical Therapy

## 2019-02-13 ENCOUNTER — Telehealth: Payer: Self-pay | Admitting: Speech Pathology

## 2019-02-13 NOTE — Telephone Encounter (Signed)
   Mrs.  (spouse) was contacted today regarding temporary reduction of [facility]patient [facility] due to concerns for community transmission of COVID-19.  Patient identity was verified.  Assessed if patient needed to be seen in person by clinician (recent fall or acute injury that requires hands on assessment and advice, change in diet order, post-surgical, special cases, etc.).    Discussed pt progress and recommend d/c ST at this time. Pt does have remaining PT/OT visits they wish to keep   Patient did not have an acute/special need that requires in person visit. Proceeded with phone call.  Therapist advised the patient to continue to perform his/her HEP and assured he/she had no unanswered questions or concerns at this time.   The patient was offered and declined the continuation of their plan of care by using methods such as an E-Visit, virtual check in, or Telehealth visit.  [facility] will follow up with this client when we are able to safely resume care at the [facility] [address] [facility] in person.    Patient is aware we can be reached by telephone during limited business hours in the meantime.   Clinton Sawyer MS, CCC-SLP

## 2019-02-17 ENCOUNTER — Ambulatory Visit: Payer: BLUE CROSS/BLUE SHIELD | Admitting: Psychology

## 2019-03-03 ENCOUNTER — Encounter: Payer: BLUE CROSS/BLUE SHIELD | Attending: Psychology | Admitting: Psychology

## 2019-03-03 ENCOUNTER — Other Ambulatory Visit: Payer: Self-pay

## 2019-03-03 DIAGNOSIS — G3189 Other specified degenerative diseases of nervous system: Secondary | ICD-10-CM | POA: Diagnosis not present

## 2019-03-03 DIAGNOSIS — S069X0S Unspecified intracranial injury without loss of consciousness, sequela: Secondary | ICD-10-CM | POA: Diagnosis not present

## 2019-03-03 DIAGNOSIS — F09 Unspecified mental disorder due to known physiological condition: Secondary | ICD-10-CM | POA: Diagnosis not present

## 2019-03-03 DIAGNOSIS — F4310 Post-traumatic stress disorder, unspecified: Secondary | ICD-10-CM

## 2019-03-04 ENCOUNTER — Encounter: Payer: Self-pay | Admitting: Speech Pathology

## 2019-03-04 NOTE — Therapy (Signed)
[place] [address] [place], [place], [address] Phone: [phone]   Fax:  [phone]  Patient Details  Name: Charles Marquez MRN: [id number] Date of Birth: May 22, 1956 Referring Provider:  No ref. provider found  Encounter Date: 03/04/2019   SPEECH THERAPY DISCHARGE SUMMARY  Visits from Start of Care: 24  Current functional level related to goals / functional outcomes: See goals below   Remaining deficits: Aphasia; cognition   Education / Equipment: Compensations for aphasia; compensations for memory      SLP Short Term Goals - 03/04/19 1033      SLP SHORT TERM GOAL #1   Title  Pt will complete standardized assessment of cognition.    Status  Achieved      SLP SHORT TERM GOAL #2   Title  Pt will ID object/picture to simple description/feature/function f:4 with occasional min A over 3 sessions     Status  Partially Met      SLP SHORT TERM GOAL #3   Title  Pt will demo auditory comprehension of 5 minutes simple-mod complex conversation by responding appropriately or asking questions over 2 sessions    Status  Achieved      SLP SHORT TERM GOAL #4   Title  Pt will name basic objects/pictures 7/10 correct with occasional mod A over 3 sessions     Status  Partially Met      SLP SHORT TERM GOAL #5   Title  Pt will demo sustained attention for 10 minutes in simple cognitive-linguistic task x3 sessions.    Status  Achieved      SLP SHORT TERM GOAL #6   Title  Pt will have a memory system to assist in managing appointments, schedules, medical and therapy information and bring with him to 3 therapy sessions     Status  Achieved      SLP Long Term Goals - 03/04/19 1033      SLP LONG TERM GOAL #1   Title  Pt will write or name 5 words in personally relevant category with occasional min A over 4 sessions     Status  Partially Met      SLP LONG TERM GOAL #2   Title  Pt will demo error awareness by  attempting correction or by nonverbal response to errors 75% of the time over 3 sessions    Status  Achieved      SLP LONG TERM GOAL #3   Title  Pt will participate functionally in 10 minutes simple-mod complex conversation with conversational supports for aphasia over 3 sessions.    Status  Achieved      SLP LONG TERM GOAL #4   Title  Pt will demo selective attention in min noisy environment for 10 minutes in a simple-mod complex cognitive linguistic task over three sessions    Status  Deferred      SLP LONG TERM GOAL #5   Title  Pt will utilize memory compensation system to recall details/manage appointments, schedules, medical and therapy information with rare min A over 4 sessions      Baseline  11/28/18, 12/23/18, 01/09/19    Time  1    Period  Weeks    Status  Achieved     SLP LONG TERM GOAL #6   Title  Pt will demo WFL reading comprehension for high interest materials (texts, emails, news articles)by answering questions,summarizing material, or composing responses x3 sessions.    Baseline  12/26/18, 12/30/18  Time  1    Period  Weeks    Status  Achieved     SLP LONG TERM GOAL #7   Title  Pt will generate appropriate written or typed responses to emails and text messages >90% accuracy (compensations allowed) x 3 sessions    Baseline  12/30/18, 01/09/19    Time  1    Period  Weeks    Status  Achieved     SLP LONG TERM GOAL #8   Title  Pt will participate functionally in 10 minutes mod complex conversation with conversational supports for aphasia over 3 sessions.    Baseline  12/30/18, 01/09/19    Time  1    Period  Weeks    Status  Achieved      Plan: Patient agrees to discharge.  Patient goals were met. Patient is being discharged due to meeting the stated rehab goals.  ?????        Katanya Schlie, Annye Rusk  MS, CCC-SLP 03/04/2019, 10:33 AM  [facility] [address] [place], [place], [address] Phone:  [phone]   Fax:  [phone]

## 2019-03-17 ENCOUNTER — Other Ambulatory Visit: Payer: Self-pay

## 2019-03-17 ENCOUNTER — Encounter: Payer: BLUE CROSS/BLUE SHIELD | Attending: Psychology | Admitting: Psychology

## 2019-03-17 DIAGNOSIS — G3189 Other specified degenerative diseases of nervous system: Secondary | ICD-10-CM | POA: Insufficient documentation

## 2019-03-17 DIAGNOSIS — F4310 Post-traumatic stress disorder, unspecified: Secondary | ICD-10-CM | POA: Diagnosis present

## 2019-03-17 DIAGNOSIS — S069XAS Unspecified intracranial injury with loss of consciousness status unknown, sequela: Secondary | ICD-10-CM

## 2019-03-17 DIAGNOSIS — S069X0S Unspecified intracranial injury without loss of consciousness, sequela: Secondary | ICD-10-CM | POA: Insufficient documentation

## 2019-03-17 DIAGNOSIS — F09 Unspecified mental disorder due to known physiological condition: Secondary | ICD-10-CM | POA: Diagnosis not present

## 2019-03-18 ENCOUNTER — Encounter: Payer: Self-pay | Admitting: Psychology

## 2019-03-18 NOTE — Progress Notes (Signed)
Patient:  Charles Marquez   DOB: Jul 16, 1956  MR Number: [id number]  Location: [facility] FOR PAIN AND REHABILITATIVE MEDICINE [facility] PHYSICAL MEDICINE AND REHABILITATION [address] [place], [address] [id number] [facility] [place] [address] Dept: [phone]  Start: 4 PM End: 5 PM  Provider/Observer:     Hershal Coria PsyD  Chief Complaint:      Chief Complaint  Patient presents with  . Anxiety  . Pain  . Memory Loss  . Post-Traumatic Stress Disorder    Reason For Service:     Charles Marquez is a 63 year old male referred for neuropsychological consultation due to coping and adjustment issues following significant issues from difficulties with his shunt.  The patient has had ongoing issues with aphasia, gait disturbance, visual-spatial deficits.  The patient's wife reports that there have been multiple shunt infections and revisions.  While the patient's expressive language abilities and memory abilities have improved there continue to be issues with memory, expressive language, sequencing and organization, gait disturbances and double vision.  The above reason for service has been reviewed prior to this appointment and remains applicable and appropriate for this visit.  The patient has continued to struggle with understanding how his cognitive and physical changes are impacting his overall life.    Interventions Strategy:  Therapeutic interventions to build on coping and adjustment issues following significant neurological deficits due to malfunction and repeatedly developing infections in his shunt.   Participation Level:   Active  Participation Quality:  Appropriate      Behavioral Observation:  Well Groomed, Alert, and Appropriate.   Current Psychosocial Factors: The patient reports that he went to the [facility] with his family, which is something that they have regularly done in the past.  The patient reports that initially he was hesitant to doing this  worrying that he would keep his family from having a good time.  While the patient did have some difficulties having to do with balance on a floating dock on the boat they were able to adjust to this and the patient reports that he actually ended up having a good time and is glad that he did this.  Content of Session:   Reviewed current symptoms and continue to work on therapeutic interventions around adjusting to residual neurological deficits following development of hydrocephalus and multiple revisions of his shunt.  Current Status:   The patient reports that he has been more active recently and doing things with his family.  He is trying to adjust to the significant sudden loss of work and other functions.  Patient Progress:   The patient has made significant gains over the past couple of weeks and has been more engaged and active.  Impression/Diagnosis:   Charles Marquez is a 63 year old male with a history of hyperlipidemia, CVA with loop recorder insertion, right occipital VP shunt in 2016.  The patient lives with his wife.  The patient had been working for [employer].  The patient had an admission on 08/04/2018 for unsteadiness of gait, nausea and vomiting, findings of malfunction VP shunt with revision 08/05/2018.  The patient was discharged home but was readmitted on 08/08/2018-08/22/2018 for VP shunt infection.  Once this is cleared up he again underwent removal of VP shunt.  The patient continued showed later mildly enlarged right lateral ventricle, but left ventricle third ventricle and fourth ventricle were small.  Later around 08/30/2018 there was suspected VP shunt malfunction.  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  The patient had follow-up care through the inpatient rehabilitation services at [facility].  The patient has been going through physical and occupational therapies as well as being followed up by Dr. Patel.  The patient has shown  some significant improvement in his aphasia, memory, motor functioning, and other cognitive functioning.  The patient is able to walk now and has been aggressively working on balance.  The patient worked for years with [employer] before going to work for [employer].  He had only worked there for 3 weeks when he had his first shot in 2016.  He was taken out of work and then did try to return to work but is not working now.  Current goals include learning to adapt to new life before all of these neurological difficulties.  The patient reports that he has been very active but there is been a huge change in his abilities as these cerebrovascular accident.  he patient reports that he has been more active recently and doing things with his family.  He is trying to adjust to the significant sudden loss of work and other functions.  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      

## 2019-03-18 NOTE — Progress Notes (Signed)
Patient:  Charles Marquez   DOB: Aug 30, 1956  MR Number: [id number]  Location: [facility] FOR PAIN AND REHABILITATIVE MEDICINE [facility] PHYSICAL MEDICINE AND REHABILITATION [address] [place], [address] [id number] [facility] [place] [address] Dept: [phone]  Start: 4 PM End: 5 PM  Provider/Observer:     Hershal Coria PsyD  Chief Complaint:      Chief Complaint  Patient presents with  . Anxiety  . Pain  . Post-Traumatic Stress Disorder  . Memory Loss    Reason For Service:     Charles Marquez is a 63 year old male referred for neuropsychological consultation due to coping and adjustment issues following significant issues from difficulties with his shunt.  The patient has had ongoing issues with aphasia, gait disturbance, visual-spatial deficits.  The patient's wife reports that there have been multiple shunt infections and revisions.  While the patient's expressive language abilities and memory abilities have improved there continue to be issues with memory, expressive language, sequencing and organization, gait disturbances and double vision.  The above reason for service has been reviewed prior to this appointment and remains applicable and appropriate for this visit.  The patient has continued to struggle with understanding how his cognitive and physical changes are impacting his overall life.  The patient has improved his understanding and is coping better in overall life functioning.    Interventions Strategy:  Therapeutic interventions to build on coping and adjustment issues following significant neurological deficits due to malfunction and repeatedly developing infections in his shunt.   Participation Level:   Active  Participation Quality:  Appropriate      Behavioral Observation:  Well Groomed, Alert, and Appropriate.   Current Psychosocial Factors: The patient reports that he has continued to improve overall functioning and while he still struggles  with word finding that has improved and he is spending more time with family and grand kids .   Content of Session:   Reviewed current symptoms and continue to work on therapeutic interventions around adjusting to residual neurological deficits following development of hydrocephalus and multiple revisions of his shunt.  Current Status:   The patient reports that he has been more active recently and doing things with his family.  He is trying to adjust to the significant sudden loss of work and other functions.  Patient Progress:   The patient has made significant gains over the past couple of weeks and has been more engaged and active.  Impression/Diagnosis:   Charles Marquez is a 63 year old male with a history of hyperlipidemia, CVA with loop recorder insertion, right occipital VP shunt in 2016.  The patient lives with his wife.  The patient had been working for [employer].  The patient had an admission on 08/04/2018 for unsteadiness of gait, nausea and vomiting, findings of malfunction VP shunt with revision 08/05/2018.  The patient was discharged home but was readmitted on 08/08/2018-08/22/2018 for VP shunt infection.  Once this is cleared up he again underwent removal of VP shunt.  The patient continued showed later mildly enlarged right lateral ventricle, but left ventricle third ventricle and fourth ventricle were small.  Later around 08/30/2018 there was suspected VP shunt malfunction.  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  The patient had follow-up care through the inpatient rehabilitation services at [facility]  The patient has been going through physical and occupational therapies as well as being followed up by Dr. Allena Katz.  The patient has shown some  significant improvement in his aphasia, memory, motor functioning, and other cognitive functioning.  The patient is able to walk now and has been aggressively working on balance.  The patient  worked for years with [employer] before going to work for [employer].  He had only worked there for 3 weeks when he had his first shot in 2016.  He was taken out of work and then did try to return to work but is not working now.  Current goals include learning to adapt to new life before all of these neurological difficulties.  The patient reports that he has been very active but there is been a huge change in his abilities as these cerebrovascular accident.  he patient reports that he has been more active recently and doing things with his family.  He is trying to adjust to the significant sudden loss of work and other functions.  Today we have finished our last visit and for now will not make more unless there is any worsening with symptoms or coping.    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

## 2019-04-04 ENCOUNTER — Ambulatory Visit: Payer: BLUE CROSS/BLUE SHIELD | Attending: Physical Medicine & Rehabilitation | Admitting: Physical Therapy

## 2019-04-04 ENCOUNTER — Encounter: Payer: Self-pay | Admitting: Physical Therapy

## 2019-04-04 ENCOUNTER — Other Ambulatory Visit: Payer: Self-pay

## 2019-04-04 DIAGNOSIS — R2689 Other abnormalities of gait and mobility: Secondary | ICD-10-CM | POA: Insufficient documentation

## 2019-04-04 DIAGNOSIS — R29818 Other symptoms and signs involving the nervous system: Secondary | ICD-10-CM | POA: Diagnosis present

## 2019-04-04 DIAGNOSIS — R2681 Unsteadiness on feet: Secondary | ICD-10-CM | POA: Diagnosis present

## 2019-04-04 NOTE — Progress Notes (Signed)
Mr. Bussiere is cleared to resume physical therapy.  Val Eagle, DNP, AGNP-C Nurse Practitioner 9:50 AM   [facility] [address], [address], [place], [place] [address] P: [phone]    F: [phone]

## 2019-04-06 NOTE — Therapy (Signed)
[facility] [address] [place], [place], [address] Phone: [phone]   Fax:  [phone]  Physical Therapy Treatment  Patient Details  Name: Charles Marquez MRN: [id number] Date of Birth: 03-23-1956 Referring Provider (PT): Cooper Render. Pool, MD   Encounter Date: 04/04/2019   CLINIC OPERATION CHANGES: [facility] is operating at a low capacity due to COVID-19.  The patient was brought into the clinic for evaluation and/or treatment following universal masking by staff, social distancing, and <10 people in the clinic.  The patient's COVID risk of complications score is 3.   PT End of Session - 04/06/19 1430    Visit Number  1    Number of Visits  7    Date for PT Re-Evaluation  05/21/19    Authorization Type  [employer] OTHER     Authorization Time Period  PT/OT/ST/Hydro therapy combined: 90 VL    PT Start Time  1147    PT Stop Time  1233    PT Time Calculation (min)  46 min    Activity Tolerance  Patient tolerated treatment well    Behavior During Therapy  WFL for tasks assessed/performed       Past Medical History:  Diagnosis Date  . Anxiety   . Hypercholesteremia   . Stroke ([place])    tia's  . TIA (transient ischemic attack)    09.15    Past Surgical History:  Procedure Laterality Date  . Fractured arm Left 12  . HERNIA REPAIR Right 3/12  . LAPAROSCOPIC REVISION VENTRICULAR-PERITONEAL (V-P) SHUNT N/A 08/26/2018   Procedure: LAPAROSCOPIC INSERTION VENTRICULAR-PERITONEAL (V-P) SHUNT;  Surgeon: Earnie Larsson, MD;  Location: [facility];  Service: Neurosurgery;  Laterality: N/A;  . LAPAROSCOPIC REVISION VENTRICULAR-PERITONEAL (V-P) SHUNT N/A 01/17/2019   Procedure: LAPAROSCOPIC REVISION VENTRICULAR-PERITONEAL (V-P) SHUNT;  Surgeon: Donnie Mesa, MD;  Location: [facility];  Service: General;  Laterality: N/A;  . LOOP RECORDER INSERTION N/A 04/10/2017   Procedure: Loop Recorder Insertion;  Surgeon:  Thompson Grayer, MD;  Location: [facility];  Service: Cardiovascular;  Laterality: N/A;  . SHUNT REMOVAL Right 03/13/2016   Procedure: SHUNT REMOVAL;  Surgeon: Earnie Larsson, MD;  Location: [facility];  Service: Neurosurgery;  Laterality: Right;  . SHUNT REMOVAL Right 08/09/2018   Procedure: SHUNT REMOVAL With Placement of Ventricular Catheter;  Surgeon: Consuella Lose, MD;  Location: [facility];  Service: Neurosurgery;  Laterality: Right;  . SHUNT REVISION Right 08/05/2018   Procedure: SHUNT REVISION;  Surgeon: Earnie Larsson, MD;  Location: [facility];  Service: Neurosurgery;  Laterality: Right;  . SHUNT REVISION Left 12/06/2018   Procedure: Shunt Revision - left;  Surgeon: Earnie Larsson, MD;  Location: [facility];  Service: Neurosurgery;  Laterality: Left;  Shunt Revision - left  . SHUNT REVISION N/A 12/09/2018   Procedure: SHUNT REVISION;  Surgeon: Earnie Larsson, MD;  Location: [facility];  Service: Neurosurgery;  Laterality: N/A;  . SHUNT REVISION Left 01/17/2019   Procedure: Shunt Revision - left;  Surgeon: Earnie Larsson, MD;  Location: [facility];  Service: Neurosurgery;  Laterality: Left;  . SHUNT REVISION VENTRICULAR-PERITONEAL Left 08/26/2018   Procedure: SHUNT REVISION VENTRICULAR-PERITONEAL;  Surgeon: Earnie Larsson, MD;  Location: [facility];  Service: Neurosurgery;  Laterality: Left;  . SHUNT REVISION VENTRICULAR-PERITONEAL Left 09/02/2018   Procedure: Left Occipital VP shunt revision;  Surgeon: Earnie Larsson, MD;  Location: [facility];  Service: Neurosurgery;  Laterality: Left;  [name] VASECTOMY  10/02/1997  . VENTRICULOPERITONEAL SHUNT Right 12/18/2014  Procedure: Shunt Placment - right occipital VP shunt ;  Surgeon: Charlie Pitter, MD;  Location: [facility];  Service: Neurosurgery;  Laterality: Right;  Shunt Placment - right occipital VP shunt   . VENTRICULOPERITONEAL SHUNT Right 07/22/2018   Procedure: Shunt Placment right occipital;  Surgeon: Earnie Larsson, MD;  Location: [facility];  Service: Neurosurgery;  Laterality: Right;  .  VENTRICULOPERITONEAL SHUNT Left 08/26/2018   Procedure: LEFT SIDED VENTRICULAR-PERITONEAL SHUNT;  Surgeon: Earnie Larsson, MD;  Location: [facility];  Service: Neurosurgery;  Laterality: Left;  [name] VENTRICULOSTOMY Right 08/09/2018   Procedure: VENTRICULOSTOMY;  Surgeon: Consuella Lose, MD;  Location: [facility];  Service: Neurosurgery;  Laterality: Right;    There were no vitals filed for this visit.     04/04/19 1155  Symptoms/Limitations  Subjective Pt returns after another revision of shunt in abdomen in March, no issues since revision.  When weather changes he has more balance issues.  Has been taking a lot of walks in the neighborhood, even in the rain.  No falls.  Wife reports difficulty rising from low, soft surfaces.  Patient is accompained by: Family member (friend)  Pertinent History  S/p VP shunt, Hydrocephalus, CVA with loop recorder insertion 04/10/2017 as well as R occipital VP shunt placement 2016, Infection of ventricular shunt, shunt malfunction, HLD, anxiety, TIA, Hypercholesteremia, acute blood loss anemia, HTN, Tachycardia, Leukocytosis, Hyponatremia, Bacterial Encephalitis, Bacterial Meningitis, Dyslipidemia, DDD lumbar spine, and Mycoplasma Pneumonia.   Limitations Walking;House hold activities;Lifting  Patient Stated Goals "Id love to be able to run backwards again to [employer] basketball games and improve my memory."  Pain Assessment  Currently in Pain? No/denies       04/04/19 1203  Assessment  Medical Diagnosis hydrocephalus - shunt revision  Referring Provider (PT) Cooper Render. Pool, MD  Onset Date/Surgical Date 12/18/18  Hand Dominance Right  Prior Therapy prior inpatient and outpatient rehab  Precautions  Precautions Other (comment)  Precaution Comments 10lb lifting restriction; R hemianopsia, TIA, CVA, hypercholesteremia, anxiety and ventriculostomy with 10 shunt revisions  Prior Function  Level of Independence Independent  Observation/Other Assessments  Focus on  Therapeutic Outcomes (FOTO)  Not assessed  Transfers  Transfers Sit to Stand;Stand to Sit  Sit to Stand 6: Modified independent (Device/Increase time)  Stand to Sit 6: Modified independent (Device/Increase time)  Comments From 17" low surface to simulate low/soft couch.  Weight back on heels.  Ambulation/Gait  Stairs Yes  Stairs Assistance 6: Modified independent (Device/Increase time)  Stair Management Technique No rails;Forwards  Number of Stairs 12  Height of Stairs 6  Standardized Balance Assessment  Standardized Balance Assessment Five Times Sit to Stand  Five times sit to stand comments  12.09 from 17" soft surface, rocking back on heels when standing  10 Meter Walk 6.53 seconds or 5.02 ft/sec  Timed Up and Go Test  TUG Normal TUG;Cognitive TUG  Manual TUG (seconds) 7.75  Cognitive TUG (seconds) 7.59 (naming animals for each letter of alphabet)  TUG Comments With COG TUG, time improved but pt became confused about task and tried to pick up target  Functional Gait  Assessment  Gait assessed  Yes  Gait Level Surface 3  Change in Gait Speed 3  Gait with Horizontal Head Turns 3  Gait with Vertical Head Turns 3  Gait and Pivot Turn 3  Step Over Obstacle 3  Gait with Narrow Base of Support 1  Gait with Eyes Closed 1  Ambulating Backwards 3  Steps 3  Total Score  26  FGA comment: low falls risk; 26/30                        Balance Exercises - 04/06/19 1427      Balance Exercises: Standing   Sit to Stand Time  Demonstrated to patient how to perform sit > stand exercise from low chair placed 3 feet from wall and use of anterior lean to touch hand to wall and maintain hand on wall while transitioning from sit <> stand to maintain forward placement of COG over BOS.          PT Education - 04/06/19 1429    Education Details  clinical findings with re-assessment; new goals and areas to continue to address, sit <> stand biomechanics and exercise     Person(s) Educated  Patient;Spouse    Methods  Explanation;Demonstration    Comprehension  Verbalized understanding;Returned demonstration       PT Short Term Goals - 04/06/19 1430      PT SHORT TERM GOAL #1   Title  = LTG        PT Long Term Goals - 04/06/19 1431      PT LONG TERM GOAL #1   Title  Pt will be independent and compliant with performing PROGRESSED HEP to improve balance, strength, and functional mobility.    Time  8    Period  Weeks    Status  On-going      PT LONG TERM GOAL #2   Title  Pt maintain gait velocity of >4.0 ft/sec but demonstrate decreased veering and running into objects on R due to improved awareness of field cut    Baseline  >5.0 ft/sec on 5/22    Time  8    Period  Weeks    Status  Achieved      PT LONG TERM GOAL #3   Title  Pt will demonstrate 12 steps with no HR's at mod I level with improved control for forward momentum when descending    Baseline  met on 5/22    Time  8    Period  Weeks    Status  Achieved      PT LONG TERM GOAL #4   Title  Pt will improve FGA score to >/=26/30 indicating improvement in functional mobility.     Baseline  26/30 on 5/22    Time  8    Period  Weeks    Status  Achieved      PT LONG TERM GOAL #5   Title  Pt will demonstrate improved ability to perform dual tasking as indicated by <20% difference between TUG and COG TUG    Baseline  equal time for both tasks ~7 seconds but when performing COG TUG pt became confused about task and picked up target when returning to chair    Time  8    Period  Weeks    Status  Achieved        New LTG for recertification: PT Long Term Goals - 04/06/19 1442      PT LONG TERM GOAL #1   Title  Pt will be independent and compliant with performing PROGRESSED HEP to improve balance, strength, and functional mobility.    Time  6    Period  Weeks    Status  Revised    Target Date  05/21/19      PT LONG TERM GOAL #2   Title  Pt will demonstrate  ability to perform safe  sit <> stand from low surface as demonstrated in five time sit to stand from <17" surface without use of UE in </= 10 seconds    Baseline  12 seconds using UE    Time  6    Period  Weeks    Status  New    Target Date  05/21/19      PT LONG TERM GOAL #3   Title  Pt will demonstrate ability to perform jogging forwards and backwards with head turns x 100' each with supervision in controlled environment to simulate basketball referee activities.    Time  6    Period  Weeks    Status  New    Target Date  05/21/19      PT LONG TERM GOAL #4   Title  Pt will improve FGA score to >/=28/30 indicating improvement in functional mobility.     Baseline  26/30 on 5/22    Time  6    Period  Weeks    Status  Revised    Target Date  05/21/19            Plan - 04/06/19 1435    Clinical Impression Statement  Pt returns following prolonged break from therapy due to abdominal shunt revision and due to COVID 19 restrictions.  Pt participated in reassessment of functional level and LTG.  Despite time away from therapy pt has made good progress while performing HEP and walking program at home. He has met 5/5 originally set LTG.  He demonstrates improvements in dynamic standing balance, balance during more dynamic gait activities, and improved balance during dual tasking.  He continues to demonstrate mild cognitive and sequencing impairments during dual task and ongoing balance impairments when vision is removed and with more narrow BOS.  He also continues to experience intermittent posterior LOB when standing from lower surfaces.  Pt to update plan and goal and will continue to address these impairments to continue to progress functional mobility independence and decrease risk for falls.    Rehab Potential  Good    PT Frequency  1x / week    PT Duration  6 weeks    PT Treatment/Interventions  ADLs/Self Care Home Management;Therapeutic activities;Therapeutic exercise;Balance training;Neuromuscular  re-education;Patient/family education;Stair training;Gait training;Functional mobility training;Cognitive remediation;Aquatic Therapy    PT Next Visit Plan  LIFTING RESTRICTION OF 10LB; AVOID STRAIN ON ABDOMINAL INCISION. Upgrade HEP: sit <> stand with hand on wall, balance with EC, Tandem gait.  Continue to work on balance with eyes closed.  activities with dual tasking.  Floor ladder.  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  high level balance and strengthening in quadruped/ tall kneeling.    PT Home Exercise Plan  [email or web address]     Recommended Other Services  Incision healed for aquatic therapy?    Consulted and Agree with Plan of Care  Patient;Family member/caregiver    Family Member Consulted  wife       Patient will benefit from skilled therapeutic intervention in order to improve the following deficits and impairments:  Abnormal gait, Decreased safety awareness, Decreased balance, Decreased cognition, Decreased strength, Difficulty walking  Visit Diagnosis: Other symptoms and signs involving the nervous system  Unsteadiness on feet  Other abnormalities of gait and mobility     Problem List Patient Active Problem List   Diagnosis Date Noted  . Visual disturbance   . Slow transit constipation   . Hypoalbuminemia due to protein-calorie malnutrition (  [facility])   . Acute blood loss anemia   . S/P VP shunt   . Hydrocephalus ([facility]) 08/30/2018  . Dyslipidemia   . History of CVA (cerebrovascular accident)   . Benign essential HTN   . Tachycardia   . Leukocytosis   . Hyponatremia   . Hypokalemia   . 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 Infection of ventricular shunt ([facility]) 08/09/2018  . Bacterial meningitis 08/09/2018  . TIA (transient ischemic attack) 01/29/2017  . Acute encephalopathy   . Shunt malfunction 03/13/2016  . 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  Hyperlipidemia 10/20/2014  . Degenerative disc disease, lumbar 04/15/2013  . Routine general medical examination at a health care facility 07/23/2012  . DISTURBANCE OF SKIN SENSATION 10/05/2008  . HYPERLIPIDEMIA 01/01/2008  . MYCOPLASMA PNEUMONIA 01/01/2008    Rico Junker, PT, DPT 04/06/19    2:42 PM    [facility] [address] [place], [place], [address] Phone: [phone]   Fax:  [phone]  Name: Kyl Givler MRN: [id number] Date of Birth: 13-May-1956

## 2019-04-11 ENCOUNTER — Encounter: Payer: Self-pay | Admitting: Physical Therapy

## 2019-04-11 ENCOUNTER — Ambulatory Visit: Payer: BLUE CROSS/BLUE SHIELD | Admitting: Physical Therapy

## 2019-04-11 ENCOUNTER — Other Ambulatory Visit: Payer: Self-pay

## 2019-04-11 DIAGNOSIS — R2689 Other abnormalities of gait and mobility: Secondary | ICD-10-CM

## 2019-04-11 DIAGNOSIS — R2681 Unsteadiness on feet: Secondary | ICD-10-CM

## 2019-04-11 DIAGNOSIS — R29818 Other symptoms and signs involving the nervous system: Secondary | ICD-10-CM

## 2019-04-11 NOTE — Patient Instructions (Addendum)
Access Code: [email or web address]  URL: [email or web address]  Date: 04/11/2019  Prepared by: Bufford Lope   Exercises Walking Tandem Stance - 4 sets - 2x daily - 5x weekly Side Step with Agility Ladder - 3 sets - 2x daily - 5x weekly Standing Terminal Knee Extension with Resistance - 8 reps - 2 sets - 5 second hold - 2x daily - 5x weekly Romberg Stance with Eyes Closed - 10 reps - 2 sets - 2x daily - 7x weekly Isometric Gluteus Medius at Wall - 2 sets - 15-20 hold - 1x daily - 7x weekly Tandem Stance - 4 sets - 10 reps - 2x daily - 5x weekly Sidelying Forearm Plank with Knees Bent - 10 reps - 2 sets - 1x daily - 7x weekly

## 2019-04-11 NOTE — Therapy (Signed)
[facility] [address] [place], [place], [address] Phone: [phone]   Fax:  [phone]  Physical Therapy Treatment  Patient Details  Name: Charles Marquez MRN: [id number] Date of Birth: [birth year] Referring Provider (PT): Kathaleen Maser. Pool, MD   Encounter Date: 04/11/2019   CLINIC OPERATION CHANGES: [facility] is operating at a low capacity due to COVID-19.  The patient was brought into the clinic for evaluation and/or treatment following universal masking by staff, social distancing, and <10 people in the clinic.  The patient's COVID risk of complications score is 3.   PT End of Session - 04/11/19 1243    Visit Number  2    Number of Visits  7    Date for PT Re-Evaluation  05/21/19    Authorization Type  BCBS OTHER     Authorization Time Period  PT/OT/ST/Hydro therapy combined: 90 VL    PT Start Time  1145    PT Stop Time  1230    PT Time Calculation (min)  45 min    Activity Tolerance  Patient tolerated treatment well    Behavior During Therapy  WFL for tasks assessed/performed       Past Medical History:  Diagnosis Date  . Anxiety   . Hypercholesteremia   . Stroke (HCC)    tia's  . TIA (transient ischemic attack)    09.15    Past Surgical History:  Procedure Laterality Date  . Fractured arm Left 12  . HERNIA REPAIR Right 3/12  . LAPAROSCOPIC REVISION VENTRICULAR-PERITONEAL (V-P) SHUNT N/A 08/26/2018   Procedure: LAPAROSCOPIC INSERTION VENTRICULAR-PERITONEAL (V-P) SHUNT;  Surgeon: Julio Sicks, MD;  [facility]: [facility] OR;  Service: Neurosurgery;  Laterality: N/A;  . LAPAROSCOPIC REVISION VENTRICULAR-PERITONEAL (V-P) SHUNT N/A 01/17/2019   Procedure: LAPAROSCOPIC REVISION VENTRICULAR-PERITONEAL (V-P) SHUNT;  Surgeon: Manus Rudd, MD;  [facility]: [facility] OR;  Service: General;  Laterality: N/A;  . LOOP RECORDER INSERTION N/A 04/10/2017   Procedure: Loop Recorder Insertion;  Surgeon:  Hillis Range, MD;  [facility] INVAS[facility] CV LAB;  Service: Cardiovascular;  Laterality: N/A;  . SHUNT REMOVAL Right 03/13/2016   Procedure: SHUNT REMOVAL;  Surgeon: Julio Sicks, MD;  [facility] NEURO ORS;  Service: Neurosurgery;  Laterality: Right;  . SHUNT REMOVAL Right 08/09/2018   Procedure: SHUNT REMOVAL With Placement of Ventricular Catheter;  Surgeon: Lisbeth Renshaw, MD;  Location: [facility] OR;  Service: Neurosurgery;  Laterality: Right;  . SHUNT REVISION Right 08/05/2018   Procedure: SHUNT REVISION;  Surgeon: Julio Sicks, MD;  Location: [facility] OR;  Service: Neurosurgery;  Laterality: Right;  . SHUNT REVISION Left 12/06/2018   Procedure: Shunt Revision - left;  Surgeon: Julio Sicks, MD;  Location: [facility] OR;  Service: Neurosurgery;  Laterality: Left;  Shunt Revision - left  . SHUNT REVISION N/A 12/09/2018   Procedure: SHUNT REVISION;  Surgeon: Julio Sicks, MD;  Location: [facility] OR;  Service: Neurosurgery;  Laterality: N/A;  . SHUNT REVISION Left 01/17/2019   Procedure: Shunt Revision - left;  Surgeon: Julio Sicks, MD;  Location: [facility] OR;  Service: Neurosurgery;  Laterality: Left;  . SHUNT REVISION VENTRICULAR-PERITONEAL Left 08/26/2018   Procedure: SHUNT REVISION VENTRICULAR-PERITONEAL;  Surgeon: Julio Sicks, MD;  Location: [facility] OR;  Service: Neurosurgery;  Laterality: Left;  . SHUNT REVISION VENTRICULAR-PERITONEAL Left 09/02/2018   Procedure: Left Occipital VP shunt revision;  Surgeon: Julio Sicks, MD;  Location: [facility] OR;  Service: Neurosurgery;  Laterality: Left;  [name] VASECTOMY  10/02/1997  . VENTRICULOPERITONEAL SHUNT Right 12/18/2014  Procedure: Shunt Placment - right occipital VP shunt ;  Surgeon: Temple Pacini, MD;  [facility];  Service: Neurosurgery;  Laterality: Right;  Shunt Placment - right occipital VP shunt   . VENTRICULOPERITONEAL SHUNT Right 07/22/2018   Procedure: Shunt Placment right occipital;  Surgeon: Julio Sicks, MD;  Location: [facility] OR;  Service: Neurosurgery;  Laterality: Right;  .  VENTRICULOPERITONEAL SHUNT Left 08/26/2018   Procedure: LEFT SIDED VENTRICULAR-PERITONEAL SHUNT;  Surgeon: Julio Sicks, MD;  Location: [facility] OR;  Service: Neurosurgery;  Laterality: Left;  [name] VENTRICULOSTOMY Right 08/09/2018   Procedure: VENTRICULOSTOMY;  Surgeon: Lisbeth Renshaw, MD;  Location: [facility] OR;  Service: Neurosurgery;  Laterality: Right;    There were no vitals filed for this visit.  Subjective Assessment - 04/11/19 1150    Subjective  Doing well.  Tried the sit <> stand exercise at home but it was too easy.  Needs to watch it again.  Still not driving due to lower visual field impairments.    Patient is accompained by:  Family member   friend   Pertinent History   S/p VP shunt, Hydrocephalus, CVA with loop recorder insertion 04/10/2017 as well as R occipital VP shunt placement 2016, Infection of ventricular shunt, shunt malfunction, HLD, anxiety, TIA, Hypercholesteremia, acute blood loss anemia, HTN, Tachycardia, Leukocytosis, Hyponatremia, Bacterial Encephalitis, Bacterial Meningitis, Dyslipidemia, DDD lumbar spine, and Mycoplasma Pneumonia.     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  No/denies       Reviewed and upgraded the following exercises with pt return demonstrating each and wife observing to provide cues and supervision:   Access Code: [email or web address]  URL: [email or web address]  Date: 04/11/2019  Prepared by: Bufford Lope   Exercises Walking Tandem Stance - 4 sets - 2x daily - 5x weekly Side Step with Agility Ladder - 3 sets - 2x daily - 5x weekly Standing Terminal Knee Extension with Resistance - 8 reps - 2 sets - 5 second hold - 2x daily - 5x weekly Romberg Stance with Eyes Closed - 10 reps - 2 sets - 2x daily - 7x weekly Isometric Gluteus Medius at Wall - 2 sets - 15-20 hold - 1x daily - 7x weekly Tandem  Stance - 4 sets - 10 reps - 2x daily - 5x weekly Sidelying Forearm Plank with Knees Bent - 10 reps - 2 sets - 1x daily - 7x weekly                        PT Education - 04/11/19 1239    Education Details  updated and progressed HEP    Person(s) Educated  Patient;Spouse    Methods  Explanation;Demonstration;Handout    Comprehension  Verbalized understanding;Returned demonstration       PT Short Term Goals - 04/06/19 1430      PT SHORT TERM GOAL #1   Title  = LTG        PT Long Term Goals - 04/06/19 1442      PT LONG TERM GOAL #1   Title  Pt will be independent and compliant with performing PROGRESSED HEP to improve balance, strength, and functional mobility.    Time  6    Period  Weeks    Status  Revised    Target Date  05/21/19  PT LONG TERM GOAL #2   Title  Pt will demonstrate ability to perform safe sit <> stand from low surface as demonstrated in five time sit to stand from <17" surface without use of UE in </= 10 seconds    Baseline  12 seconds using UE    Time  6    Period  Weeks    Status  New    Target Date  05/21/19      PT LONG TERM GOAL #3   Title  Pt will demonstrate ability to perform jogging forwards and backwards with head turns x 100' each with supervision in controlled environment to simulate basketball referee activities.    Time  6    Period  Weeks    Status  New    Target Date  05/21/19      PT LONG TERM GOAL #4   Title  Pt will improve FGA score to >/=28/30 indicating improvement in functional mobility.     Baseline  26/30 on 5/22    Time  6    Period  Weeks    Status  Revised    Target Date  05/21/19            Plan - 04/11/19 1244    Clinical Impression Statement  Due to patient's significant progress, treatment session focused on revision of current HEP.  Able to progress multiple exercises to decreased UE support and decreased use of vision.  Also, now that abdominal wound is healed, incorporated closed chain  lateral hip and trunk strengthening to HEP.  Pt tolerated well; will continue to progress towards LTG.    Rehab Potential  Good    PT Frequency  1x / week    PT Duration  6 weeks    PT Treatment/Interventions  ADLs/Self Care Home Management;Therapeutic activities;Therapeutic exercise;Balance training;Neuromuscular re-education;Patient/family education;Stair training;Gait training;Functional mobility training;Cognitive remediation;Aquatic Therapy    PT Next Visit Plan  Sit <> stand from low surfaces and on ramp; pt still has lower visual field impairments - work on walking and locating low objects (on floor) or negotiating obstacles on floor, floor ladder for agility, balance with eyes closed    PT Home Exercise Plan  [email or web address]     Consulted and Agree with Plan of Care  Patient;Family member/caregiver    Family Member Consulted  wife       Patient will benefit from skilled therapeutic intervention in order to improve the following deficits and impairments:  Abnormal gait, Decreased safety awareness, Decreased balance, Decreased cognition, Decreased strength, Difficulty walking  Visit Diagnosis: Other symptoms and signs involving the nervous system  Unsteadiness on feet  Other abnormalities of gait and mobility     Problem List Patient Active Problem List   Diagnosis Date Noted  . Visual disturbance   . Slow transit constipation   . Hypoalbuminemia due to protein-calorie malnutrition (HCC)   . Acute blood loss anemia   . S/P VP shunt   . Hydrocephalus (HCC) 08/30/2018  . Dyslipidemia   . History of CVA (cerebrovascular accident)   . Benign essential HTN   . Tachycardia   . Leukocytosis   . Hyponatremia   . Hypokalemia   . 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 Bacterial meningitis 08/09/2018  . TIA (transient ischemic attack) 01/29/2017  . Acute encephalopathy   . Shunt malfunction 03/13/2016  . Small vessel disease,  cerebrovascular 01/25/2015  . 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 Hyperlipidemia 10/20/2014  .  Degenerative disc disease, lumbar 04/15/2013  . Routine general medical examination at a health care facility 07/23/2012  . DISTURBANCE OF SKIN SENSATION 10/05/2008  . HYPERLIPIDEMIA 01/01/2008  . MYCOPLASMA PNEUMONIA 01/01/2008    Dierdre HighmanAudra F , PT, DPT 04/11/19    1:02 PM    [facility] [address] [place], [place], [address] Phone: [phone]   Fax:  [phone]  Name: Celene SquibbKurt Edward Marquez MRN: [id number] Date of Birth: Sep 03, 1956

## 2019-04-18 ENCOUNTER — Encounter: Payer: Self-pay | Admitting: Physical Therapy

## 2019-04-18 ENCOUNTER — Other Ambulatory Visit: Payer: Self-pay

## 2019-04-18 ENCOUNTER — Ambulatory Visit: Payer: BC Managed Care – PPO | Attending: Physical Medicine & Rehabilitation | Admitting: Physical Therapy

## 2019-04-18 DIAGNOSIS — R2681 Unsteadiness on feet: Secondary | ICD-10-CM | POA: Insufficient documentation

## 2019-04-18 DIAGNOSIS — R2689 Other abnormalities of gait and mobility: Secondary | ICD-10-CM | POA: Insufficient documentation

## 2019-04-18 DIAGNOSIS — R29818 Other symptoms and signs involving the nervous system: Secondary | ICD-10-CM | POA: Diagnosis present

## 2019-04-18 NOTE — Therapy (Signed)
[facility] [address] [place], [place], [address] Phone: [phone]   Fax:  [phone]  Physical Therapy Treatment  Patient Details  Name: Charles Marquez MRN: [id number] Date of Birth: [birth year] Referring Provider (PT): Kathaleen Maser. Pool, MD   Encounter Date: 04/18/2019   CLINIC OPERATION CHANGES: Outpatient Neuro Rehab is open at lower capacity following universal masking, social distancing, and patient screening.  The patient's COVID risk of complications score is 3.   PT End of Session - 04/18/19 0855    Visit Number  3    Number of Visits  7    Date for PT Re-Evaluation  05/21/19    Authorization Type  BCBS OTHER     Authorization Time Period  PT/OT/ST/Hydro therapy combined: 90 VL    PT Start Time  0802    PT Stop Time  0848    PT Time Calculation (min)  46 min    Activity Tolerance  Patient tolerated treatment well    Behavior During Therapy  WFL for tasks assessed/performed       Past Medical History:  Diagnosis Date  . Anxiety   . Hypercholesteremia   . Stroke (HCC)    tia's  . TIA (transient ischemic attack)    09.15    Past Surgical History:  Procedure Laterality Date  . Fractured arm Left 12  . HERNIA REPAIR Right 3/12  . LAPAROSCOPIC REVISION VENTRICULAR-PERITONEAL (V-P) SHUNT N/A 08/26/2018   Procedure: LAPAROSCOPIC INSERTION VENTRICULAR-PERITONEAL (V-P) SHUNT;  Surgeon: Julio Sicks, MD;  [facility];  Service: Neurosurgery;  Laterality: N/A;  . LAPAROSCOPIC REVISION VENTRICULAR-PERITONEAL (V-P) SHUNT N/A 01/17/2019   Procedure: LAPAROSCOPIC REVISION VENTRICULAR-PERITONEAL (V-P) SHUNT;  Surgeon: Manus Rudd, MD;  Location: [facility] OR;  Service: General;  Laterality: N/A;  . LOOP RECORDER INSERTION N/A 04/10/2017   Procedure: Loop Recorder Insertion;  Surgeon: Hillis Range, MD;  [facility];  Service: Cardiovascular;  Laterality: N/A;  . SHUNT REMOVAL Right 03/13/2016   Procedure: SHUNT REMOVAL;  Surgeon: Julio Sicks, MD;  [facility];  Service: Neurosurgery;  Laterality: Right;  . SHUNT REMOVAL Right 08/09/2018   Procedure: SHUNT REMOVAL With Placement of Ventricular Catheter;  Surgeon: Lisbeth Renshaw, MD;  Location: [facility] OR;  Service: Neurosurgery;  Laterality: Right;  . SHUNT REVISION Right 08/05/2018   Procedure: SHUNT REVISION;  Surgeon: Julio Sicks, MD;  Location: [facility] OR;  Service: Neurosurgery;  Laterality: Right;  . SHUNT REVISION Left 12/06/2018   Procedure: Shunt Revision - left;  Surgeon: Julio Sicks, MD;  Location: [facility] OR;  Service: Neurosurgery;  Laterality: Left;  Shunt Revision - left  . SHUNT REVISION N/A 12/09/2018   Procedure: SHUNT REVISION;  Surgeon: Julio Sicks, MD;  Location: [facility] OR;  Service: Neurosurgery;  Laterality: N/A;  . SHUNT REVISION Left 01/17/2019   Procedure: Shunt Revision - left;  Surgeon: Julio Sicks, MD;  Location: [facility] OR;  Service: Neurosurgery;  Laterality: Left;  . SHUNT REVISION VENTRICULAR-PERITONEAL Left 08/26/2018   Procedure: SHUNT REVISION VENTRICULAR-PERITONEAL;  Surgeon: Julio Sicks, MD;  Location: [facility] OR;  Service: Neurosurgery;  Laterality: Left;  . SHUNT REVISION VENTRICULAR-PERITONEAL Left 09/02/2018   Procedure: Left Occipital VP shunt revision;  Surgeon: Julio Sicks, MD;  Location: [facility] OR;  Service: Neurosurgery;  Laterality: Left;  [name] VASECTOMY  10/02/1997  . 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 VENTRICULOPERITONEAL SHUNT Right 07/22/2018   Procedure: Shunt Placment right occipital;  Surgeon: Julio Sicks, MD;  Location: [facility] OR;  Service: Neurosurgery;  Laterality: Right;  . VENTRICULOPERITONEAL SHUNT Left 08/26/2018   Procedure: LEFT SIDED VENTRICULAR-PERITONEAL SHUNT;  Surgeon: Julio Sicks, MD;  Location:  [facility] OR;  Service: Neurosurgery;  Laterality: Left;  [name] VENTRICULOSTOMY Right 08/09/2018   Procedure: VENTRICULOSTOMY;  Surgeon: Lisbeth Renshaw, MD;  Location: [facility] OR;  Service: Neurosurgery;  Laterality: Right;    There were no vitals filed for this visit.  Subjective Assessment - 04/18/19 0804    Subjective  No issues this past week.  Son has a pool, would like to have some exercises to do in the pool.    Patient is accompained by:  Family member   friend   Pertinent History   S/p VP shunt, Hydrocephalus, CVA with loop recorder insertion 04/10/2017 as well as R occipital VP shunt placement 2016, Infection of ventricular shunt, shunt malfunction, HLD, anxiety, TIA, Hypercholesteremia, acute blood loss anemia, HTN, Tachycardia, Leukocytosis, Hyponatremia, Bacterial Encephalitis, Bacterial Meningitis, Dyslipidemia, DDD lumbar spine, and Mycoplasma Pneumonia.     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  No/denies                       [facility] Adult PT Treatment/Exercise - 04/18/19 0825      Transfers   Transfers  Sit to Stand;Stand to Sit    Comments  from 14" low box on level surface with hand on the wall sliding up/down to shift weight forwards.  Also performed sit <> stand from low chair without arms facing up and down ramp: 10-12 reps EO,/wide BOS, EO/narrow BOS, EC/wide BOS with supervision/min A      Neuro Re-ed    Neuro Re-ed Details   Walking around gym with various colored cones on the floor and having pt utilize lower visual field to locate obstacle and safely negotiate around obstacles without and then with dual cognitive task (naming every other letter in alphabet) with pt demonstrating increased episodes of LOB and kicking cones when performing cognitive dual task.  Also performed tapping cones when seen in lower  visual field and then adding 1,2,3 specific instructions to address memory and dual task (don't tap red, double tap green, R foot tap blue).  Also performed ambulating around gym with pt having to find red/green cones in lower visual field with stopping at red cone, various tasks at green cone (side stepping, stop and turn around, retro gait, fast gait).  Final task included ambulating and when cone seen in lower visual field pt cued to squat down to pick up             PT Education - 04/18/19 0855    Education Details  will add aquatic exercises to HEP    Person(s) Educated  Patient;Spouse    Methods  Explanation    Comprehension  Verbalized understanding       PT Short Term Goals - 04/06/19 1430      PT SHORT TERM GOAL #1   Title  = LTG        PT Long Term Goals - 04/06/19 1442      PT LONG TERM  GOAL #1   Title  Pt will be independent and compliant with performing PROGRESSED HEP to improve balance, strength, and functional mobility.    Time  6    Period  Weeks    Status  Revised    Target Date  05/21/19      PT LONG TERM GOAL #2   Title  Pt will demonstrate ability to perform safe sit <> stand from low surface as demonstrated in five time sit to stand from <17" surface without use of UE in </= 10 seconds    Baseline  12 seconds using UE    Time  6    Period  Weeks    Status  New    Target Date  05/21/19      PT LONG TERM GOAL #3   Title  Pt will demonstrate ability to perform jogging forwards and backwards with head turns x 100' each with supervision in controlled environment to simulate basketball referee activities.    Time  6    Period  Weeks    Status  New    Target Date  05/21/19      PT LONG TERM GOAL #4   Title  Pt will improve FGA score to >/=28/30 indicating improvement in functional mobility.     Baseline  26/30 on 5/22    Time  6    Period  Weeks    Status  Revised    Target Date  05/21/19            Plan - 04/18/19 1209    Clinical  Impression Statement  Continued to focus on sit <> stand sequencing from lower surfaces and then on uneven surfaces to focus on control of forward weight shifting.  Included sit <> stand with EC for greater use of proprioceptive and vestibular input.  Utilized obstacles on floor to facilitate attention to objects in lower visual field with pt demonstrating greater difficulty attending to obstacles when cognitive dual task added.  Will continue to address in order to progress towards LTG.    Rehab Potential  Good    PT Frequency  1x / week    PT Duration  6 weeks    PT Treatment/Interventions  ADLs/Self Care Home Management;Therapeutic activities;Therapeutic exercise;Balance training;Neuromuscular re-education;Patient/family education;Stair training;Gait training;Functional mobility training;Cognitive remediation;Aquatic Therapy    PT Next Visit Plan  balance on rockerboard and ramp; pt still has lower visual field impairments - work on walking and locating low objects (on floor) or negotiating obstacles on floor, floor ladder for agility, balance with eyes closed    PT Home Exercise Plan  [email or web address]     Consulted and Agree with Plan of Care  Patient;Family member/caregiver    Family Member Consulted  wife       Patient will benefit from skilled therapeutic intervention in order to improve the following deficits and impairments:  Abnormal gait, Decreased safety awareness, Decreased balance, Decreased cognition, Decreased strength, Difficulty walking  Visit Diagnosis: Other symptoms and signs involving the nervous system  Unsteadiness on feet  Other abnormalities of gait and mobility     Problem List Patient Active Problem List   Diagnosis Date Noted  . Visual disturbance   . Slow transit constipation   . Hypoalbuminemia due to protein-calorie malnutrition (HCC)   . Acute blood loss anemia   . S/P VP shunt   . Hydrocephalus (HCC) 08/30/2018  . Dyslipidemia   . History of CVA  (cerebrovascular accident)   . Benign essential  HTN   . Tachycardia   . Leukocytosis   . Hyponatremia   . Hypokalemia   . 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 Bacterial meningitis 08/09/2018  . TIA (transient ischemic attack) 01/29/2017  . Acute encephalopathy   . Shunt malfunction 03/13/2016  . 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 Hyperlipidemia 10/20/2014  . Degenerative disc disease, lumbar 04/15/2013  . Routine general medical examination at a health care facility 07/23/2012  . DISTURBANCE OF SKIN SENSATION 10/05/2008  . HYPERLIPIDEMIA 01/01/2008  . MYCOPLASMA PNEUMONIA 01/01/2008    Dierdre HighmanAudra F Indie Nickerson, PT, DPT 04/18/19    12:16 PM     [facility] [address] [place], [place], [address] Phone: [phone]   Fax:  [phone]  Name: Charles Marquez MRN: [id number] Date of Birth: 1955-11-17

## 2019-04-21 ENCOUNTER — Inpatient Hospital Stay (HOSPITAL_COMMUNITY): Payer: BC Managed Care – PPO

## 2019-04-21 ENCOUNTER — Inpatient Hospital Stay (HOSPITAL_COMMUNITY)
Admission: EM | Admit: 2019-04-21 | Discharge: 2019-04-24 | DRG: 032 | Disposition: A | Discharge status: Home / Self Care | Payer: BC Managed Care – PPO | Attending: Neurosurgery | Admitting: Neurosurgery

## 2019-04-21 ENCOUNTER — Emergency Department (HOSPITAL_COMMUNITY): Payer: BC Managed Care – PPO

## 2019-04-21 DIAGNOSIS — R51 Headache: Secondary | ICD-10-CM | POA: Diagnosis present

## 2019-04-21 DIAGNOSIS — G910 Communicating hydrocephalus: Secondary | ICD-10-CM | POA: Diagnosis present

## 2019-04-21 DIAGNOSIS — F419 Anxiety disorder, unspecified: Secondary | ICD-10-CM | POA: Diagnosis present

## 2019-04-21 DIAGNOSIS — Z8673 Personal history of transient ischemic attack (TIA), and cerebral infarction without residual deficits: Secondary | ICD-10-CM

## 2019-04-21 DIAGNOSIS — Z79899 Other long term (current) drug therapy: Secondary | ICD-10-CM | POA: Diagnosis not present

## 2019-04-21 DIAGNOSIS — Z1159 Encounter for screening for other viral diseases: Secondary | ICD-10-CM

## 2019-04-21 DIAGNOSIS — Z7902 Long term (current) use of antithrombotics/antiplatelets: Secondary | ICD-10-CM | POA: Diagnosis not present

## 2019-04-21 DIAGNOSIS — I10 Essential (primary) hypertension: Secondary | ICD-10-CM | POA: Diagnosis present

## 2019-04-21 DIAGNOSIS — E785 Hyperlipidemia, unspecified: Secondary | ICD-10-CM | POA: Diagnosis present

## 2019-04-21 DIAGNOSIS — Z825 Family history of asthma and other chronic lower respiratory diseases: Secondary | ICD-10-CM | POA: Diagnosis not present

## 2019-04-21 DIAGNOSIS — T8509XA Other mechanical complication of ventricular intracranial (communicating) shunt, initial encounter: Principal | ICD-10-CM | POA: Diagnosis present

## 2019-04-21 DIAGNOSIS — Y752 Prosthetic and other implants, materials and neurological devices associated with adverse incidents: Secondary | ICD-10-CM | POA: Diagnosis present

## 2019-04-21 DIAGNOSIS — T85618A Breakdown (mechanical) of other specified internal prosthetic devices, implants and grafts, initial encounter: Secondary | ICD-10-CM | POA: Diagnosis present

## 2019-04-21 DIAGNOSIS — Z419 Encounter for procedure for purposes other than remedying health state, unspecified: Secondary | ICD-10-CM

## 2019-04-21 LAB — URINALYSIS, ROUTINE W REFLEX MICROSCOPIC
Bilirubin Urine: NEGATIVE
Glucose, UA: NEGATIVE mg/dL
Hgb urine dipstick: NEGATIVE
Ketones, ur: 5 mg/dL — AB
Leukocytes,Ua: NEGATIVE
Nitrite: NEGATIVE
Protein, ur: NEGATIVE mg/dL
Specific Gravity, Urine: 1.016 (ref 1.005–1.030)
pH: 6 (ref 5.0–8.0)

## 2019-04-21 LAB — BASIC METABOLIC PANEL
Anion gap: 10 (ref 5–15)
BUN: 16 mg/dL (ref 8–23)
CO2: 25 mmol/L (ref 22–32)
Calcium: 9.3 mg/dL (ref 8.9–10.3)
Chloride: 104 mmol/L (ref 98–111)
Creatinine, Ser: 0.86 mg/dL (ref 0.61–1.24)
GFR calc Af Amer: >60 mL/min (ref >60)
GFR calc non Af Amer: >60 mL/min (ref >60)
Glucose, Bld: 91 mg/dL (ref 70–99)
Potassium: 4.1 mmol/L (ref 3.5–5.1)
Sodium: 139 mmol/L (ref 135–145)

## 2019-04-21 LAB — CBC
HCT: 41.5 % (ref 39.0–52.0)
Hemoglobin: 13.3 g/dL (ref 13.0–17.0)
MCH: 29.5 pg (ref 26.0–34.0)
MCHC: 32 g/dL (ref 30.0–36.0)
MCV: 92 fL (ref 80.0–100.0)
Platelets: 201 10*3/uL (ref 150–400)
RBC: 4.51 MIL/uL (ref 4.22–5.81)
RDW: 12.9 % (ref 11.5–15.5)
WBC: 6.5 10*3/uL (ref 4.0–10.5)
nRBC: 0 % (ref 0.0–0.2)

## 2019-04-21 LAB — SARS CORONAVIRUS 2 BY RT PCR (HOSPITAL ORDER, PERFORMED IN ~~LOC~~ HOSPITAL LAB): SARS Coronavirus 2: NEGATIVE

## 2019-04-21 MED ORDER — ATORVASTATIN CALCIUM 40 MG PO TABS
40.0000 mg | ORAL_TABLET | Freq: Every day | ORAL | Status: DC
  Administered 2019-04-22 – 2019-04-24 (×3): 40 mg via ORAL
  Filled 2019-04-21 (×3): qty 1

## 2019-04-21 MED ORDER — SODIUM CHLORIDE 0.9 % IV SOLN
INTRAVENOUS | Status: DC
  Administered 2019-04-21 – 2019-04-23 (×5): via INTRAVENOUS

## 2019-04-21 MED ORDER — ONDANSETRON HCL 4 MG/2ML IJ SOLN
4.0000 mg | Freq: Once | INTRAMUSCULAR | Status: AC
  Administered 2019-04-21: 4 mg via INTRAVENOUS
  Filled 2019-04-21: qty 2

## 2019-04-21 MED ORDER — ONDANSETRON HCL 4 MG PO TABS: 4.0000 mg | ORAL_TABLET | Freq: Four times a day (QID) | ORAL | Status: DC | PRN

## 2019-04-21 MED ORDER — ACETAMINOPHEN 650 MG RE SUPP: 650.0000 mg | Freq: Four times a day (QID) | RECTAL | Status: DC | PRN

## 2019-04-21 MED ORDER — ACETAMINOPHEN 325 MG PO TABS
650.0000 mg | ORAL_TABLET | Freq: Four times a day (QID) | ORAL | Status: DC | PRN
  Administered 2019-04-22 – 2019-04-23 (×4): 650 mg via ORAL
  Filled 2019-04-21 (×4): qty 2

## 2019-04-21 MED ORDER — SODIUM CHLORIDE 0.9% FLUSH
3.0000 mL | Freq: Two times a day (BID) | INTRAVENOUS | Status: DC
  Administered 2019-04-21 – 2019-04-24 (×4): 3 mL via INTRAVENOUS

## 2019-04-21 MED ORDER — SODIUM CHLORIDE 0.9% FLUSH: 3.0000 mL | Freq: Once | INTRAVENOUS | Status: AC

## 2019-04-21 MED ORDER — ONDANSETRON HCL 4 MG/2ML IJ SOLN
4.0000 mg | Freq: Four times a day (QID) | INTRAMUSCULAR | Status: DC | PRN
  Administered 2019-04-22: 4 mg via INTRAVENOUS
  Filled 2019-04-21: qty 2

## 2019-04-21 NOTE — ED Triage Notes (Signed)
Pt in with dizziness, HA's, memory loss , balance issues and and emesis x few days. Has failed VP shunt, was sent by Dr. Annette Stable neurosurgeon to be evaluated

## 2019-04-21 NOTE — Progress Notes (Signed)
Pt admitted from ED with c/o of confusion and memory loss/ pt alert, follows command and denies any pain at this time, pt settled in bed with call light at bedside, tele monitor put and verified on pt, safety measures explained and initiated accordingly, was however reassured and will continue to monitor. Charles Marquez, Meir Elwood Efe

## 2019-04-21 NOTE — ED Notes (Signed)
Paged n/s Dr Annette Stable to [place]

## 2019-04-21 NOTE — ED Provider Notes (Signed)
[facility] EMERGENCY DEPARTMENT Provider Note   CSN: [id number] Arrival date & time: 04/21/19  1703    History   Chief Complaint Chief Complaint  Patient presents with   VP shunt failure   sent by doc    HPI Charles Marquez is a 63 y.o. male.     The history is provided by the patient, the EMS personnel and medical records.  Headache  Pain location:  Generalized Quality:  Dull Radiates to:  Does not radiate Severity currently:  7/10 Severity at highest:  10/10 Onset quality:  Gradual Duration:  4 days Timing:  Constant Progression:  Worsening Chronicity:  Chronic Similar to prior headaches: yes   Context: activity, eating and emotional stress   Context: not caffeine and not loud noise   Relieved by:  Nothing Worsened by:  Activity Ineffective treatments:  Cold packs, resting in a darkened room and prescription medications Associated symptoms: myalgias, nausea and vomiting   Associated symptoms: no abdominal pain, no back pain, no focal weakness and no URI   Associated symptoms comment:  Memory loss, amnesia Risk factors: no anger and lifestyle not sedentary     Past Medical History:  Diagnosis Date   Anxiety    Hypercholesteremia    Stroke ([place])    tia's   TIA (transient ischemic attack)    09.15    Patient Active Problem List   Diagnosis Date Noted   Visual disturbance    Slow transit constipation    Hypoalbuminemia due to protein-calorie malnutrition (HCC)    Acute blood loss anemia    S/P VP shunt    Hydrocephalus ([place]) 08/30/2018   Dyslipidemia    History of CVA (cerebrovascular accident)    Benign essential HTN    Tachycardia    Leukocytosis    Hyponatremia    Hypokalemia    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   Infection of ventricular shunt ([place]) 08/09/2018   Bacterial meningitis 08/09/2018   TIA (transient ischemic attack) 01/29/2017   Acute encephalopathy    Shunt malfunction 03/13/2016    Small vessel disease, cerebrovascular 01/25/2015   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   Hyperlipidemia 10/20/2014   Degenerative disc disease, lumbar 04/15/2013   Routine general medical examination at a health care facility 07/23/2012   DISTURBANCE OF SKIN SENSATION 10/05/2008   HYPERLIPIDEMIA 01/01/2008   MYCOPLASMA PNEUMONIA 01/01/2008    Past Surgical History:  Procedure Laterality Date   Fractured arm Left 12   HERNIA REPAIR Right 3/12   LAPAROSCOPIC REVISION VENTRICULAR-PERITONEAL (V-P) SHUNT N/A 08/26/2018   Procedure: LAPAROSCOPIC INSERTION VENTRICULAR-PERITONEAL (V-P) SHUNT;  Surgeon: Earnie Larsson, MD;  Location: [place];  Service: Neurosurgery;  Laterality: N/A;   LAPAROSCOPIC REVISION VENTRICULAR-PERITONEAL (V-P) SHUNT N/A 01/17/2019   Procedure: LAPAROSCOPIC REVISION VENTRICULAR-PERITONEAL (V-P) SHUNT;  Surgeon: Donnie Mesa, MD;  Location: [place];  Service: General;  Laterality: N/A;   LOOP RECORDER INSERTION N/A 04/10/2017   Procedure: Loop Recorder Insertion;  Surgeon: Thompson Grayer, MD;  Location: [place] [facility];  Service: Cardiovascular;  Laterality: N/A;   SHUNT REMOVAL Right 03/13/2016   Procedure: SHUNT REMOVAL;  Surgeon: Earnie Larsson, MD;  Location: [facility];  Service: Neurosurgery;  Laterality: Right;   SHUNT REMOVAL Right 08/09/2018   Procedure: SHUNT REMOVAL With Placement of Ventricular Catheter;  Surgeon: Consuella Lose, MD;  Location: [place];  Service: Neurosurgery;  Laterality: Right;   SHUNT REVISION Right 08/05/2018   Procedure: SHUNT REVISION;  Surgeon: Earnie Larsson, MD;  Location: [place];  Service: Neurosurgery;  Laterality: Right;   SHUNT REVISION Left 12/06/2018   Procedure: Shunt Revision - left;  Surgeon: Julio SicksPool, Henry, MD;  [facility];  Service: Neurosurgery;  Laterality: Left;  Shunt Revision - left   SHUNT REVISION N/A 12/09/2018   Procedure: SHUNT REVISION;  Surgeon: Julio SicksPool, Henry, MD;  [facility];  Service:  Neurosurgery;  Laterality: N/A;   SHUNT REVISION Left 01/17/2019   Procedure: Shunt Revision - left;  Surgeon: Julio SicksPool, Henry, MD;  [facility];  Service: Neurosurgery;  Laterality: Left;   SHUNT REVISION VENTRICULAR-PERITONEAL Left 08/26/2018   Procedure: SHUNT REVISION VENTRICULAR-PERITONEAL;  Surgeon: Julio SicksPool, Henry, MD;  [facility];  Service: Neurosurgery;  Laterality: Left;   SHUNT REVISION VENTRICULAR-PERITONEAL Left 09/02/2018   Procedure: Left Occipital VP shunt revision;  Surgeon: Julio SicksPool, Henry, MD;  [facility];  Service: Neurosurgery;  Laterality: Left;   VASECTOMY  10/02/1997   VENTRICULOPERITONEAL SHUNT Right 12/18/2014   Procedure: Shunt Placment - right occipital VP shunt ;  Surgeon: Temple PaciniHenry A Pool, MD;  Location: [facility];  Service: Neurosurgery;  Laterality: Right;  Shunt Placment - right occipital VP shunt    VENTRICULOPERITONEAL SHUNT Right 07/22/2018   Procedure: Shunt Placment right occipital;  Surgeon: Julio SicksPool, Henry, MD;  [facility];  Service: Neurosurgery;  Laterality: Right;   VENTRICULOPERITONEAL SHUNT Left 08/26/2018   Procedure: LEFT SIDED VENTRICULAR-PERITONEAL SHUNT;  Surgeon: Julio SicksPool, Henry, MD;  [facility];  Service: Neurosurgery;  Laterality: Left;   VENTRICULOSTOMY Right 08/09/2018   Procedure: VENTRICULOSTOMY;  Surgeon: Lisbeth RenshawNundkumar, Neelesh, MD;  [facility];  Service: Neurosurgery;  Laterality: Right;        Home Medications    Prior to Admission medications   Medication Sig Start Date End Date Taking? Authorizing Provider  acetaminophen (TYLENOL) 325 MG tablet Take 2 tablets (650 mg total) by mouth every 4 (four) hours as needed for mild pain (temp > 100.5). 09/18/18   Angiulli, Mcarthur Rossettianiel J, PA-C  atorvastatin (LIPITOR) 40 MG tablet Take 1 tablet (40 mg total) by mouth daily. 09/18/18   Angiulli, Mcarthur Rossettianiel J, PA-C  Cetirizine HCl (ZYRTEC PO) Take 10 mg by mouth daily as needed (seasonal allergies).     [provider]  clopidogrel  (PLAVIX) 75 MG tablet Take 75 mg by mouth daily.    [provider]  hydroxypropyl methylcellulose / hypromellose (ISOPTO TEARS / GONIOVISC) 2.5 % ophthalmic solution Place 1 drop into both eyes as needed for dry eyes.    [provider]  sodium chloride (OCEAN) 0.65 % SOLN nasal spray Place 1 spray into both nostrils as needed for congestion.    [provider]    Family History Family History  Problem Relation Age of Onset   COPD Mother    Lung cancer Father    Alzheimer's disease Father     Social History Social History   Tobacco Use   Smoking status: Never Smoker   Smokeless tobacco: Never Used  Substance Use Topics   Alcohol use: Yes    Alcohol/week: 0.0 standard drinks    Comment: ocassionally   Drug use: No     Allergies   Patient has no known allergies.   Review of Systems Review of Systems  Gastrointestinal: Positive for nausea and vomiting. Negative for abdominal pain.  Musculoskeletal: Positive for myalgias. Negative for back pain.  Neurological: Positive for headaches. Negative for focal weakness.  All other systems reviewed and are negative.    Physical Exam Updated Vital  Signs BP (!) 145/96 (BP Location: Right Arm)    Pulse 60    Temp 98.3 F (36.8 C) (Oral)    Resp 17    SpO2 98%   Physical Exam Vitals signs and nursing note reviewed.  Constitutional:      Appearance: He is well-developed.  HENT:     Head: Normocephalic and atraumatic.     Mouth/Throat:     Mouth: Mucous membranes are moist.  Eyes:     Conjunctiva/sclera: Conjunctivae normal.  Neck:     Musculoskeletal: Neck supple.  Cardiovascular:     Rate and Rhythm: Normal rate.  Pulmonary:     Effort: Pulmonary effort is normal. No respiratory distress.  Abdominal:     Palpations: Abdomen is soft.     Tenderness: There is no abdominal tenderness.  Skin:    General: Skin is warm and dry.     Capillary Refill: Capillary refill takes less than 2  seconds.  Neurological:     Mental Status: He is alert. Mental status is at baseline.     Comments: Alert and interactive, memory not intact, orientation difficult secondary to memory, knows he is in the hospital, situationally knows here he is because of his VP shunt, recurrent headaches      ED Treatments / Results  Labs (all labs ordered are listed, but only abnormal results are displayed) Labs Reviewed  BASIC METABOLIC PANEL  CBC  URINALYSIS, ROUTINE W REFLEX MICROSCOPIC  CBG MONITORING, ED    EKG None  Radiology Dg Skull 1-3 Views  Result Date: 04/21/2019 CLINICAL DATA:  63 year old male with communicating hydrocephalus. Shunt series. EXAM: ABDOMEN - 1 VIEW; DG CERVICAL SPINE - 1 VIEW; CHEST  1 VIEW; SKULL - 1-3 VIEW COMPARISON:  Outside brain MRI 02/04/2019 available on [facility] PACS CT Abdomen and Pelvis 08/12/2018. Abdominal radiographs 11/05/2018 and earlier. FINDINGS: Skull: Left posterior approach intracranial catheter with left posterior convexity reservoir. Superimposed additional burr holes. Shunt tubing courses in the posterior left neck with no adverse features. No acute osseous abnormality identified. Cervical spine: Left neck shunt tubing tracks into the chest with no adverse features. No acute osseous abnormality identified. Chest: Supine view. Left side shunt tubing tracks through the chest and into the left upper quadrant with no adverse features. Superimposed cardiac event recorder on the left. Cardiac size at the upper limits of normal. Other mediastinal contours are within normal limits. Visualized tracheal air column is within normal limits. Allowing for supine technique both lungs appear clear. No acute osseous abnormality identified. Abdomen: Shunt tubing tracks into the left upper quadrant and then courses to the midline of the pelvis and is looped twice in the pelvis terminating now in the right lower quadrant. No discontinuity or adverse features identified. Non  obstructed bowel gas pattern. Abdominal and pelvic visceral contours appear stable and within normal limits. Chronic left hemipelvis surgical clips. No acute osseous abnormality identified. IMPRESSION: 1. Shunt catheter tracking from the left occiput into the pelvis with no adverse features identified. 2. No acute radiographic finding. Electronically Signed   By: Odessa Fleming M.D.   On: 04/21/2019 19:04   Dg Chest 1 View  Result Date: 04/21/2019 CLINICAL DATA:  63 year old male with communicating hydrocephalus. Shunt series. EXAM: ABDOMEN - 1 VIEW; DG CERVICAL SPINE - 1 VIEW; CHEST  1 VIEW; SKULL - 1-3 VIEW COMPARISON:  Outside brain MRI 02/04/2019 available on [facility] P[facility] CT Abdomen and Pelvis 08/12/2018. Abdominal radiographs 11/05/2018 and earlier. FINDINGS: Skull: Left posterior approach  intracranial catheter with left posterior convexity reservoir. Superimposed additional burr holes. Shunt tubing courses in the posterior left neck with no adverse features. No acute osseous abnormality identified. Cervical spine: Left neck shunt tubing tracks into the chest with no adverse features. No acute osseous abnormality identified. Chest: Supine view. Left side shunt tubing tracks through the chest and into the left upper quadrant with no adverse features. Superimposed cardiac event recorder on the left. Cardiac size at the upper limits of normal. Other mediastinal contours are within normal limits. Visualized tracheal air column is within normal limits. Allowing for supine technique both lungs appear clear. No acute osseous abnormality identified. Abdomen: Shunt tubing tracks into the left upper quadrant and then courses to the midline of the pelvis and is looped twice in the pelvis terminating now in the right lower quadrant. No discontinuity or adverse features identified. Non obstructed bowel gas pattern. Abdominal and pelvic visceral contours appear stable and within normal limits. Chronic left hemipelvis surgical  clips. No acute osseous abnormality identified. IMPRESSION: 1. Shunt catheter tracking from the left occiput into the pelvis with no adverse features identified. 2. No acute radiographic finding. Electronically Signed   By: Odessa FlemingH  Hall M.D.   On: 04/21/2019 19:04   Dg Cervical Spine 1 View  Result Date: 04/21/2019 CLINICAL DATA:  63 year old male with communicating hydrocephalus. Shunt series. EXAM: ABDOMEN - 1 VIEW; DG CERVICAL SPINE - 1 VIEW; CHEST  1 VIEW; SKULL - 1-3 VIEW COMPARISON:  Outside brain MRI 02/04/2019 available on [facility] PACS CT Abdomen and Pelvis 08/12/2018. Abdominal radiographs 11/05/2018 and earlier. FINDINGS: Skull: Left posterior approach intracranial catheter with left posterior convexity reservoir. Superimposed additional burr holes. Shunt tubing courses in the posterior left neck with no adverse features. No acute osseous abnormality identified. Cervical spine: Left neck shunt tubing tracks into the chest with no adverse features. No acute osseous abnormality identified. Chest: Supine view. Left side shunt tubing tracks through the chest and into the left upper quadrant with no adverse features. Superimposed cardiac event recorder on the left. Cardiac size at the upper limits of normal. Other mediastinal contours are within normal limits. Visualized tracheal air column is within normal limits. Allowing for supine technique both lungs appear clear. No acute osseous abnormality identified. Abdomen: Shunt tubing tracks into the left upper quadrant and then courses to the midline of the pelvis and is looped twice in the pelvis terminating now in the right lower quadrant. No discontinuity or adverse features identified. Non obstructed bowel gas pattern. Abdominal and pelvic visceral contours appear stable and within normal limits. Chronic left hemipelvis surgical clips. No acute osseous abnormality identified. IMPRESSION: 1. Shunt catheter tracking from the left occiput into the pelvis with no  adverse features identified. 2. No acute radiographic finding. Electronically Signed   By: Odessa FlemingH  Hall M.D.   On: 04/21/2019 19:04   Dg Abd 1 View  Result Date: 04/21/2019 CLINICAL DATA:  63 year old male with communicating hydrocephalus. Shunt series. EXAM: ABDOMEN - 1 VIEW; DG CERVICAL SPINE - 1 VIEW; CHEST  1 VIEW; SKULL - 1-3 VIEW COMPARISON:  Outside brain MRI 02/04/2019 available on [facility] P[facility] CT Abdomen and Pelvis 08/12/2018. Abdominal radiographs 11/05/2018 and earlier. FINDINGS: Skull: Left posterior approach intracranial catheter with left posterior convexity reservoir. Superimposed additional burr holes. Shunt tubing courses in the posterior left neck with no adverse features. No acute osseous abnormality identified. Cervical spine: Left neck shunt tubing tracks into the chest with no adverse features. No acute osseous abnormality identified.  Chest: Supine view. Left side shunt tubing tracks through the chest and into the left upper quadrant with no adverse features. Superimposed cardiac event recorder on the left. Cardiac size at the upper limits of normal. Other mediastinal contours are within normal limits. Visualized tracheal air column is within normal limits. Allowing for supine technique both lungs appear clear. No acute osseous abnormality identified. Abdomen: Shunt tubing tracks into the left upper quadrant and then courses to the midline of the pelvis and is looped twice in the pelvis terminating now in the right lower quadrant. No discontinuity or adverse features identified. Non obstructed bowel gas pattern. Abdominal and pelvic visceral contours appear stable and within normal limits. Chronic left hemipelvis surgical clips. No acute osseous abnormality identified. IMPRESSION: 1. Shunt catheter tracking from the left occiput into the pelvis with no adverse features identified. 2. No acute radiographic finding. Electronically Signed   By: Odessa FlemingH  Hall M.D.   On: 04/21/2019 19:04   Ct Head Wo  Contrast  Result Date: 04/21/2019 CLINICAL DATA:  63 year old male with headache, dizziness, memory loss. VP shunt. EXAM: CT HEAD WITHOUT CONTRAST TECHNIQUE: Contiguous axial images were obtained from the base of the skull through the vertex without intravenous contrast. COMPARISON:  Outside brain MRI 02/04/2019. Head CT 01/08/2019 and earlier. FINDINGS: Brain: Stable left posterior approach ventriculostomy catheter intersecting the bilateral lateral ventricles terminating near the mid body of the right. Increased periventricular white matter hypodensity raising the possibility of transependymal edema since 02/26, appear stable from outside March MRI. However, lateral ventricle and temporal horn size appears only slightly larger than in February. Mildly dysplastic appearance of the ventricles again noted. The 3rd ventricle size has increased since February from about 5 millimeters to 8 millimeters thickness now. The 4th ventricle is also larger (previously slitlike). Mild effacement of the suprasellar and interpeduncular cistern. Other basilar cisterns remain normal. No acute intracranial hemorrhage identified. No midline shift. No cortically based acute infarct identified. Superimposed right posterior temporal/occipital lobe encephalomalacia with dystrophic calcification, probably a prior shunt tract. Vascular: Mild Calcified atherosclerosis at the skull base. Skull: Stable.  Three previous burr holes. Sinuses/Orbits: Paranasal sinuses and mastoids are stable and well pneumatized. Other: Stable scalp soft tissues including noncontrast CT appearance of the left posterior convexity shunt reservoir or and tubing. Visualized orbit soft tissues are within normal limits. IMPRESSION: 1. Stable configuration of the left posterior approach shunt but evidence of Acute Transependymal Edema in association with mild generalized increase ventricle size compared to a February CT. Associated effacement of the suprasellar and  interpeduncular cisterns. 2. No acute intracranial hemorrhage or cortically based infarct identified. Electronically Signed   By: Odessa FlemingH  Hall M.D.   On: 04/21/2019 20:00    Procedures Procedures (including critical care time)  Medications Ordered in ED Medications  sodium chloride flush (NS) 0.9 % injection 3 mL (has no administration in time range)     Initial Impression / Assessment and Plan / ED Course  I have reviewed the triage vital signs and the nursing notes.  Pertinent labs & imaging results that were available during my care of the patient were reviewed by me and considered in my medical decision making (see chart for details).        Medical Decision Making: Charles Marquez is a 63 y.o. male who presented to the ED today with headaches, seen by neurosurgery in clinic, sent here for likely admission for shunt revision.  Reviewed and confirmed nursing documentation for past medical history, family history,  social history.  On my initial exam, the pt was calm, cooperative, conversant, follows commands appropriately, GCS 15, not tachycardic, not hypotensive, afebrile, no increased work of breathing or respiratory distress, no signs of impending respiratory failure.   Followed by Dr. Jordan Likes, neurosurgery, contacted Dr. Jordan Likes concern for shunt failure, imaging ordered, labs ordered, will admit patient for further evaluation and care, potential surgery for revision, patient made n.p.o. All radiology and laboratory studies reviewed independently and with my attending physician, agree with reading provided by radiologist unless otherwise noted.  Upon reassessing patient, patient was calm, resting comfortably, recurrent nausea, given antiemetic Patient admitted. Based on the above findings, I believe patient requires admission.  The above care was discussed with and agreed upon by my attending physician. Emergency Department Medication Summary:  Medications  sodium chloride flush  (NS) 0.9 % injection 3 mL (has no administration in time range)  atorvastatin (LIPITOR) tablet 40 mg (has no administration in time range)  sodium chloride flush (NS) 0.9 % injection 3 mL (has no administration in time range)  0.9 %  sodium chloride infusion (has no administration in time range)  acetaminophen (TYLENOL) tablet 650 mg (has no administration in time range)    Or  acetaminophen (TYLENOL) suppository 650 mg (has no administration in time range)  ondansetron (ZOFRAN) tablet 4 mg (has no administration in time range)    Or  ondansetron (ZOFRAN) injection 4 mg (has no administration in time range)        Final Clinical Impressions(s) / ED Diagnoses   Final diagnoses:  Communicating hydrocephalus [facility])    ED Discharge Orders    None       Erick Alley, MD 04/21/19 2117    Maia Plan, MD 04/22/19 [phone]

## 2019-04-21 NOTE — ED Notes (Signed)
[name] [name]) left number for contact [phone]

## 2019-04-21 NOTE — H&P (Signed)
Charles Marquez is an 63 y.o. male.   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  Patient status post VP shunt revision 3 months ago.  Patient had been doing well until approximately 5 days ago where he progressively developed some headache.  His wife notes that he has been less steady on his feet.  He is had some increasing confusion and his cognition has declined.  He has had occasional bouts of vomiting.  No history of recent fever.  Otherwise he has been feeling well.  Past Medical History:  Diagnosis Date  . Anxiety   . Hypercholesteremia   . Stroke (HCC)    tia's  . TIA (transient ischemic attack)    09.15    Past Surgical History:  Procedure Laterality Date  . Fractured arm Left 12  . HERNIA REPAIR Right 3/12  . LAPAROSCOPIC REVISION VENTRICULAR-PERITONEAL (V-P) SHUNT N/A 08/26/2018   Procedure: LAPAROSCOPIC INSERTION VENTRICULAR-PERITONEAL (V-P) SHUNT;  Surgeon: Julio SicksPool, Geonna Lockyer, MD;  Location: [facility];  Service: Neurosurgery;  Laterality: N/A;  . LAPAROSCOPIC REVISION VENTRICULAR-PERITONEAL (V-P) SHUNT N/A 01/17/2019   Procedure: LAPAROSCOPIC REVISION VENTRICULAR-PERITONEAL (V-P) SHUNT;  Surgeon: Manus Ruddsuei, Matthew, MD;  Location: [facility] OR;  Service: General;  Laterality: N/A;  . LOOP RECORDER INSERTION N/A 04/10/2017   Procedure: Loop Recorder Insertion;  Surgeon: Hillis RangeAllred, James, MD;  Location: [facility] INVASIVE CV LAB;  Service: Cardiovascular;  Laterality: N/A;  . SHUNT REMOVAL Right 03/13/2016   Procedure: SHUNT REMOVAL;  Surgeon: Julio SicksHenry Sunshyne Horvath, MD;  Location: [facility] NEURO ORS;  Service: Neurosurgery;  Laterality: Right;  . SHUNT REMOVAL Right 08/09/2018   Procedure: SHUNT REMOVAL With Placement of Ventricular Catheter;  Surgeon: Lisbeth RenshawNundkumar, Neelesh, MD;  [facility];  Service: Neurosurgery;  Laterality: Right;  . SHUNT REVISION Right 08/05/2018   Procedure: SHUNT REVISION;  Surgeon: Julio SicksPool, Trayce Caravello, MD;   [facility];  Service: Neurosurgery;  Laterality: Right;  . SHUNT REVISION Left 12/06/2018   Procedure: Shunt Revision - left;  Surgeon: Julio SicksPool, Pachia Strum, MD;  [facility];  Service: Neurosurgery;  Laterality: Left;  Shunt Revision - left  . SHUNT REVISION N/A 12/09/2018   Procedure: SHUNT REVISION;  Surgeon: Julio SicksPool, Rogelio Waynick, MD;  [facility];  Service: Neurosurgery;  Laterality: N/A;  . SHUNT REVISION Left 01/17/2019   Procedure: Shunt Revision - left;  Surgeon: Julio SicksPool, Bryah Ocheltree, MD;  [facility];  Service: Neurosurgery;  Laterality: Left;  . SHUNT REVISION VENTRICULAR-PERITONEAL Left 08/26/2018   Procedure: SHUNT REVISION VENTRICULAR-PERITONEAL;  Surgeon: Julio SicksPool, Prospero Mahnke, MD;  [facility];  Service: Neurosurgery;  Laterality: Left;  . SHUNT REVISION VENTRICULAR-PERITONEAL Left 09/02/2018   Procedure: Left Occipital VP shunt revision;  Surgeon: Julio SicksPool, Madalynne Gutmann, MD;  [facility];  Service: Neurosurgery;  Laterality: Left;  [name]. VASECTOMY  10/02/1997  . VENTRICULOPERITONEAL SHUNT Right 12/18/2014   Procedure: Shunt Placment - right occipital VP shunt ;  Surgeon: Temple PaciniHenry A Cato Liburd, MD;  [facility]: [facility] NE[facility];  Service: Neurosurgery;  Laterality: Right;  Shunt Placment - right occipital VP shunt   . VENTRICULOPERITONEAL SHUNT Right 07/22/2018   Procedure: Shunt Placment right occipital;  Surgeon: Julio SicksPool, Lowell Mcgurk, MD;  [facility];  Service: Neurosurgery;  Laterality: Right;  . VENTRICULOPERITONEAL SHUNT Left 08/26/2018   Procedure: LEFT SIDED VENTRICULAR-PERITONEAL SHUNT;  Surgeon: Julio SicksPool, Adolph Clutter, MD;  [facility];  Service: Neurosurgery;  Laterality: Left;  [name]. VENTRICULOSTOMY Right 08/09/2018   Procedure: VENTRICULOSTOMY;  Surgeon: Lisbeth RenshawNundkumar, Neelesh, MD;  Location:  [facility] OR;  Service: Neurosurgery;  Laterality: Right;    Family History  Problem Relation Age of Onset  . COPD Mother   . Lung cancer Father   . Alzheimer's disease Father    Social History:  reports that he has never smoked. He has never used  smokeless tobacco. He reports current alcohol use. He reports that he does not use drugs.  Allergies: No Known Allergies  (Not in a hospital admission)   Results for orders placed or performed during the hospital encounter of 04/21/19 (from the past 48 hour(s))  Basic metabolic panel     Status: None   Collection Time: 04/21/19  5:22 PM  Result Value Ref Range   Sodium 139 135 - 145 mmol/L   Potassium 4.1 3.5 - 5.1 mmol/L   Chloride 104 98 - 111 mmol/L   CO2 25 22 - 32 mmol/L   Glucose, Bld 91 70 - 99 mg/dL   BUN 16 8 - 23 mg/dL   Creatinine, Ser 1.610.86 0.61 - 1.24 mg/dL   Calcium 9.3 8.9 - 09.610.3 mg/dL   GFR calc non Af Amer >60 >60 mL/min   GFR calc Af Amer >60 >60 mL/min   Anion gap 10 5 - 15    Comment: Performed at [facility], [address] [place], [place] [address]  CBC     Status: None   Collection Time: 04/21/19  5:22 PM  Result Value Ref Range   WBC 6.5 4.0 - 10.5 K/uL   RBC 4.51 4.22 - 5.81 MIL/uL   Hemoglobin 13.3 13.0 - 17.0 g/dL   HCT 98.141.5 19.139.0 - 47.852.0 %   MCV 92.0 80.0 - 100.0 fL   MCH 29.5 26.0 - 34.0 pg   MCHC 32.0 30.0 - 36.0 g/dL   RDW 29.512.9 62.111.5 - 30.815.5 %   Platelets 201 150 - 400 K/uL   nRBC 0.0 0.0 - 0.2 %    Comment: Performed at [facility], [address] [place], [place] [address]   Dg Skull 1-3 Views  Result Date: 04/21/2019 CLINICAL DATA:  63 year old male with communicating hydrocephalus. Shunt series. EXAM: ABDOMEN - 1 VIEW; DG CERVICAL SPINE - 1 VIEW; CHEST  1 VIEW; SKULL - 1-3 VIEW COMPARISON:  Outside brain MRI 02/04/2019 available on [facility] PACS CT Abdomen and Pelvis 08/12/2018. Abdominal radiographs 11/05/2018 and earlier. FINDINGS: Skull: Left posterior approach intracranial catheter with left posterior convexity reservoir. Superimposed additional burr holes. Shunt tubing courses in the posterior left neck with no adverse features. No acute osseous abnormality identified. Cervical spine: Left neck shunt tubing tracks into the chest  with no adverse features. No acute osseous abnormality identified. Chest: Supine view. Left side shunt tubing tracks through the chest and into the left upper quadrant with no adverse features. Superimposed cardiac event recorder on the left. Cardiac size at the upper limits of normal. Other mediastinal contours are within normal limits. Visualized tracheal air column is within normal limits. Allowing for supine technique both lungs appear clear. No acute osseous abnormality identified. Abdomen: Shunt tubing tracks into the left upper quadrant and then courses to the midline of the pelvis and is looped twice in the pelvis terminating now in the right lower quadrant. No discontinuity or adverse features identified. Non obstructed bowel gas pattern. Abdominal and pelvic visceral contours appear stable and within normal limits. Chronic left hemipelvis surgical clips. No acute osseous abnormality identified. IMPRESSION: 1. Shunt catheter tracking from the left occiput into the pelvis  with no adverse features identified. 2. No acute radiographic finding. Electronically Signed   By: Genevie Ann M.D.   On: 04/21/2019 19:04   Dg Chest 1 View  Result Date: 04/21/2019 CLINICAL DATA:  63 year old male with communicating hydrocephalus. Shunt series. EXAM: ABDOMEN - 1 VIEW; DG CERVICAL SPINE - 1 VIEW; CHEST  1 VIEW; SKULL - 1-3 VIEW COMPARISON:  Outside brain MRI 02/04/2019 available on [facility] PACS CT Abdomen and Pelvis 08/12/2018. Abdominal radiographs 11/05/2018 and earlier. FINDINGS: Skull: Left posterior approach intracranial catheter with left posterior convexity reservoir. Superimposed additional burr holes. Shunt tubing courses in the posterior left neck with no adverse features. No acute osseous abnormality identified. Cervical spine: Left neck shunt tubing tracks into the chest with no adverse features. No acute osseous abnormality identified. Chest: Supine view. Left side shunt tubing tracks through the chest and into  the left upper quadrant with no adverse features. Superimposed cardiac event recorder on the left. Cardiac size at the upper limits of normal. Other mediastinal contours are within normal limits. Visualized tracheal air column is within normal limits. Allowing for supine technique both lungs appear clear. No acute osseous abnormality identified. Abdomen: Shunt tubing tracks into the left upper quadrant and then courses to the midline of the pelvis and is looped twice in the pelvis terminating now in the right lower quadrant. No discontinuity or adverse features identified. Non obstructed bowel gas pattern. Abdominal and pelvic visceral contours appear stable and within normal limits. Chronic left hemipelvis surgical clips. No acute osseous abnormality identified. IMPRESSION: 1. Shunt catheter tracking from the left occiput into the pelvis with no adverse features identified. 2. No acute radiographic finding. Electronically Signed   By: Genevie Ann M.D.   On: 04/21/2019 19:04   Dg Cervical Spine 1 View  Result Date: 04/21/2019 CLINICAL DATA:  63 year old male with communicating hydrocephalus. Shunt series. EXAM: ABDOMEN - 1 VIEW; DG CERVICAL SPINE - 1 VIEW; CHEST  1 VIEW; SKULL - 1-3 VIEW COMPARISON:  Outside brain MRI 02/04/2019 available on [facility] PACS CT Abdomen and Pelvis 08/12/2018. Abdominal radiographs 11/05/2018 and earlier. FINDINGS: Skull: Left posterior approach intracranial catheter with left posterior convexity reservoir. Superimposed additional burr holes. Shunt tubing courses in the posterior left neck with no adverse features. No acute osseous abnormality identified. Cervical spine: Left neck shunt tubing tracks into the chest with no adverse features. No acute osseous abnormality identified. Chest: Supine view. Left side shunt tubing tracks through the chest and into the left upper quadrant with no adverse features. Superimposed cardiac event recorder on the left. Cardiac size at the upper limits of  normal. Other mediastinal contours are within normal limits. Visualized tracheal air column is within normal limits. Allowing for supine technique both lungs appear clear. No acute osseous abnormality identified. Abdomen: Shunt tubing tracks into the left upper quadrant and then courses to the midline of the pelvis and is looped twice in the pelvis terminating now in the right lower quadrant. No discontinuity or adverse features identified. Non obstructed bowel gas pattern. Abdominal and pelvic visceral contours appear stable and within normal limits. Chronic left hemipelvis surgical clips. No acute osseous abnormality identified. IMPRESSION: 1. Shunt catheter tracking from the left occiput into the pelvis with no adverse features identified. 2. No acute radiographic finding. Electronically Signed   By: Genevie Ann M.D.   On: 04/21/2019 19:04   Dg Abd 1 View  Result Date: 04/21/2019 CLINICAL DATA:  63 year old male with communicating hydrocephalus. Shunt series. EXAM: ABDOMEN -  1 VIEW; DG CERVICAL SPINE - 1 VIEW; CHEST  1 VIEW; SKULL - 1-3 VIEW COMPARISON:  Outside brain MRI 02/04/2019 available on [facility] PACS CT Abdomen and Pelvis 08/12/2018. Abdominal radiographs 11/05/2018 and earlier. FINDINGS: Skull: Left posterior approach intracranial catheter with left posterior convexity reservoir. Superimposed additional burr holes. Shunt tubing courses in the posterior left neck with no adverse features. No acute osseous abnormality identified. Cervical spine: Left neck shunt tubing tracks into the chest with no adverse features. No acute osseous abnormality identified. Chest: Supine view. Left side shunt tubing tracks through the chest and into the left upper quadrant with no adverse features. Superimposed cardiac event recorder on the left. Cardiac size at the upper limits of normal. Other mediastinal contours are within normal limits. Visualized tracheal air column is within normal limits. Allowing for supine technique  both lungs appear clear. No acute osseous abnormality identified. Abdomen: Shunt tubing tracks into the left upper quadrant and then courses to the midline of the pelvis and is looped twice in the pelvis terminating now in the right lower quadrant. No discontinuity or adverse features identified. Non obstructed bowel gas pattern. Abdominal and pelvic visceral contours appear stable and within normal limits. Chronic left hemipelvis surgical clips. No acute osseous abnormality identified. IMPRESSION: 1. Shunt catheter tracking from the left occiput into the pelvis with no adverse features identified. 2. No acute radiographic finding. Electronically Signed   By: Odessa FlemingH  Hall M.D.   On: 04/21/2019 19:04   Ct Head Wo Contrast  Result Date: 04/21/2019 CLINICAL DATA:  63 year old male with headache, dizziness, memory loss. VP shunt. EXAM: CT HEAD WITHOUT CONTRAST TECHNIQUE: Contiguous axial images were obtained from the base of the skull through the vertex without intravenous contrast. COMPARISON:  Outside brain MRI 02/04/2019. Head CT 01/08/2019 and earlier. FINDINGS: Brain: Stable left posterior approach ventriculostomy catheter intersecting the bilateral lateral ventricles terminating near the mid body of the right. Increased periventricular white matter hypodensity raising the possibility of transependymal edema since 02/26, appear stable from outside March MRI. However, lateral ventricle and temporal horn size appears only slightly larger than in February. Mildly dysplastic appearance of the ventricles again noted. The 3rd ventricle size has increased since February from about 5 millimeters to 8 millimeters thickness now. The 4th ventricle is also larger (previously slitlike). Mild effacement of the suprasellar and interpeduncular cistern. Other basilar cisterns remain normal. No acute intracranial hemorrhage identified. No midline shift. No cortically based acute infarct identified. Superimposed right posterior  temporal/occipital lobe encephalomalacia with dystrophic calcification, probably a prior shunt tract. Vascular: Mild Calcified atherosclerosis at the skull base. Skull: Stable.  Three previous burr holes. Sinuses/Orbits: Paranasal sinuses and mastoids are stable and well pneumatized. Other: Stable scalp soft tissues including noncontrast CT appearance of the left posterior convexity shunt reservoir or and tubing. Visualized orbit soft tissues are within normal limits. IMPRESSION: 1. Stable configuration of the left posterior approach shunt but evidence of Acute Transependymal Edema in association with mild generalized increase ventricle size compared to a February CT. Associated effacement of the suprasellar and interpeduncular cisterns. 2. No acute intracranial hemorrhage or cortically based infarct identified. Electronically Signed   By: Odessa FlemingH  Hall M.D.   On: 04/21/2019 20:00    Pertinent items noted in HPI and remainder of comprehensive ROS otherwise negative.  Blood pressure (!) 145/96, pulse 60, temperature 98.3 F (36.8 C), temperature source Oral, resp. rate 17, SpO2 98 %.  Patient is awake and alert.  He is oriented to person and  place but he is somewhat blunted.  His speech is clear and fluent.  Content is appropriate.  Cranial nerve function normal bilaterally.  Motor examination 5/5 bilaterally.  Sensory examination nonfocal.  Examination of his shunt finds it to be in the left occipital region.  It pumps and refills easily.  There is no erythema swelling or other abnormality around the shunt valve or shunt tract.  His abdomen is soft.  He is nontender.  There is no rebound.  Chest is benign.  Extremities are free from injury or deformity. Assessment/Plan Likely VP shunt malfunction, proximal versus distal.  Patient's most recent problem has been due to distal shunt malfunction.  Head CT scan with good positioning of his ventricular catheter.  Ventricles are enlarged with periependymal edema.  I  interrogated his shunt in the emergency department and reprogrammed it to level 0.5.  I think that he may be having difficulty with distal occlusion possibly secondary to pseudocyst formation or adhesions.  I would like to get a CT scan of his belly.  Plan to admit to the hospital for observation.  Patient will likely need VP shunt revision tomorrow.  Sherilyn Cooter A Barney Russomanno 04/21/2019, 8:29 PM

## 2019-04-21 NOTE — ED Notes (Signed)
Paged n/s Dr Annette Stable to Dr Aleene Davidson

## 2019-04-22 ENCOUNTER — Other Ambulatory Visit: Payer: Self-pay

## 2019-04-22 ENCOUNTER — Encounter (HOSPITAL_COMMUNITY): Payer: Self-pay | Admitting: *Deleted

## 2019-04-22 LAB — SURGICAL PCR SCREEN
MRSA, PCR: NEGATIVE
Staphylococcus aureus: NEGATIVE

## 2019-04-22 MED ORDER — PROMETHAZINE HCL 25 MG/ML IJ SOLN
25.0000 mg | INTRAMUSCULAR | Status: DC | PRN
  Administered 2019-04-22: 25 mg via INTRAMUSCULAR
  Filled 2019-04-22: qty 1

## 2019-04-22 NOTE — Progress Notes (Signed)
Patient looks significantly better this morning.  Headache much improved.  Mental status and confusion also improved.  Afebrile.  Vital signs are stable.  Shunt pumps and refills well.  Speech fluid.  Patient more animated and appears more well.  Motor and sensory function intact.  Patient looks better today.  Plan to mobilize today.  Plan to check follow-up head CT scan in morning tomorrow.  If still evidence of significant ventricular enlargement then I think we will need to move forward with shunt exploration/revision tomorrow.

## 2019-04-23 ENCOUNTER — Inpatient Hospital Stay (HOSPITAL_COMMUNITY): Payer: BC Managed Care – PPO | Admitting: Anesthesiology

## 2019-04-23 ENCOUNTER — Encounter (HOSPITAL_COMMUNITY)
Admission: EM | Disposition: A | Discharge status: Home / Self Care | Payer: Self-pay | Source: Home / Self Care | Attending: Neurosurgery

## 2019-04-23 ENCOUNTER — Inpatient Hospital Stay (HOSPITAL_COMMUNITY): Payer: BC Managed Care – PPO

## 2019-04-23 ENCOUNTER — Encounter (HOSPITAL_COMMUNITY): Payer: Self-pay | Admitting: Orthopedic Surgery

## 2019-04-23 HISTORY — PX: SHUNT REVISION: SHX343

## 2019-04-23 SURGERY — SHUNT REVISION
Anesthesia: General | Site: Head | Laterality: Left

## 2019-04-23 MED ORDER — SODIUM CHLORIDE 0.9 % IV SOLN
INTRAVENOUS | Status: DC | PRN
  Administered 2019-04-23: 18:00:00

## 2019-04-23 MED ORDER — FENTANYL CITRATE (PF) 250 MCG/5ML IJ SOLN
INTRAMUSCULAR | Status: AC
  Filled 2019-04-23: qty 5

## 2019-04-23 MED ORDER — ROCURONIUM BROMIDE 10 MG/ML (PF) SYRINGE
PREFILLED_SYRINGE | INTRAVENOUS | Status: AC
  Filled 2019-04-23: qty 10

## 2019-04-23 MED ORDER — DEXAMETHASONE SODIUM PHOSPHATE 10 MG/ML IJ SOLN
INTRAMUSCULAR | Status: DC | PRN
  Administered 2019-04-23: 10 mg via INTRAVENOUS

## 2019-04-23 MED ORDER — FENTANYL CITRATE (PF) 100 MCG/2ML IJ SOLN
INTRAMUSCULAR | Status: AC
  Filled 2019-04-23: qty 2

## 2019-04-23 MED ORDER — THROMBIN 5000 UNITS EX SOLR
CUTANEOUS | Status: DC | PRN
  Administered 2019-04-23 (×2): 5000 [IU] via TOPICAL

## 2019-04-23 MED ORDER — SUGAMMADEX SODIUM 200 MG/2ML IV SOLN
INTRAVENOUS | Status: DC | PRN
  Administered 2019-04-23: 180 mg via INTRAVENOUS

## 2019-04-23 MED ORDER — FENTANYL CITRATE (PF) 100 MCG/2ML IJ SOLN
25.0000 ug | INTRAMUSCULAR | Status: DC | PRN
  Administered 2019-04-23 (×2): 50 ug via INTRAVENOUS

## 2019-04-23 MED ORDER — SODIUM CHLORIDE 0.9 % IV SOLN
INTRAVENOUS | Status: DC
  Administered 2019-04-23: 16:00:00 via INTRAVENOUS

## 2019-04-23 MED ORDER — THROMBIN 5000 UNITS EX SOLR
CUTANEOUS | Status: AC
  Filled 2019-04-23: qty 10000

## 2019-04-23 MED ORDER — EPHEDRINE SULFATE-NACL 50-0.9 MG/10ML-% IV SOSY
PREFILLED_SYRINGE | INTRAVENOUS | Status: DC | PRN
  Administered 2019-04-23: 10 mg via INTRAVENOUS
  Administered 2019-04-23: 15 mg via INTRAVENOUS

## 2019-04-23 MED ORDER — FENTANYL CITRATE (PF) 100 MCG/2ML IJ SOLN
INTRAMUSCULAR | Status: DC | PRN
  Administered 2019-04-23: 100 ug via INTRAVENOUS

## 2019-04-23 MED ORDER — HEMOSTATIC AGENTS (NO CHARGE) OPTIME
TOPICAL | Status: DC | PRN
  Administered 2019-04-23: 1 via TOPICAL

## 2019-04-23 MED ORDER — PROPOFOL 10 MG/ML IV BOLUS
INTRAVENOUS | Status: AC
  Filled 2019-04-23: qty 20

## 2019-04-23 MED ORDER — 0.9 % SODIUM CHLORIDE (POUR BTL) OPTIME
TOPICAL | Status: DC | PRN
  Administered 2019-04-23: 18:00:00 1000 mL

## 2019-04-23 MED ORDER — LIDOCAINE-EPINEPHRINE 1 %-1:100000 IJ SOLN
INTRAMUSCULAR | Status: AC
  Filled 2019-04-23: qty 1

## 2019-04-23 MED ORDER — ONDANSETRON HCL 4 MG/2ML IJ SOLN: 4.0000 mg | Freq: Once | INTRAMUSCULAR | Status: DC | PRN

## 2019-04-23 MED ORDER — HYDROCODONE-ACETAMINOPHEN 5-325 MG PO TABS: 1.0000 | ORAL_TABLET | ORAL | Status: DC | PRN

## 2019-04-23 MED ORDER — ROCURONIUM BROMIDE 50 MG/5ML IV SOSY
PREFILLED_SYRINGE | INTRAVENOUS | Status: DC | PRN
  Administered 2019-04-23: 50 mg via INTRAVENOUS

## 2019-04-23 MED ORDER — LIDOCAINE 2% (20 MG/ML) 5 ML SYRINGE
INTRAMUSCULAR | Status: AC
  Filled 2019-04-23: qty 5

## 2019-04-23 MED ORDER — MIDAZOLAM HCL 2 MG/2ML IJ SOLN
INTRAMUSCULAR | Status: AC
  Filled 2019-04-23: qty 2

## 2019-04-23 MED ORDER — SUCCINYLCHOLINE CHLORIDE 20 MG/ML IJ SOLN
INTRAMUSCULAR | Status: DC | PRN
  Administered 2019-04-23: 100 mg via INTRAVENOUS

## 2019-04-23 MED ORDER — VANCOMYCIN HCL 10 G IV SOLR
1500.0000 mg | Freq: Two times a day (BID) | INTRAVENOUS | Status: DC
  Administered 2019-04-23 – 2019-04-24 (×2): 1500 mg via INTRAVENOUS
  Filled 2019-04-23 (×4): qty 1500

## 2019-04-23 MED ORDER — CEFAZOLIN SODIUM-DEXTROSE 2-4 GM/100ML-% IV SOLN
2.0000 g | INTRAVENOUS | Status: AC
  Administered 2019-04-23: 2 g via INTRAVENOUS
  Filled 2019-04-23 (×2): qty 100

## 2019-04-23 MED ORDER — VANCOMYCIN HCL IN DEXTROSE 1-5 GM/200ML-% IV SOLN
1000.0000 mg | INTRAVENOUS | Status: AC
  Administered 2019-04-23: 1000 mg via INTRAVENOUS
  Filled 2019-04-23 (×2): qty 200

## 2019-04-23 MED ORDER — MIDAZOLAM HCL 5 MG/5ML IJ SOLN
INTRAMUSCULAR | Status: DC | PRN
  Administered 2019-04-23: 2 mg via INTRAVENOUS

## 2019-04-23 MED ORDER — ONDANSETRON HCL 4 MG/2ML IJ SOLN
INTRAMUSCULAR | Status: DC | PRN
  Administered 2019-04-23: 4 mg via INTRAVENOUS

## 2019-04-23 MED ORDER — PROPOFOL 10 MG/ML IV BOLUS
INTRAVENOUS | Status: DC | PRN
  Administered 2019-04-23: 30 mg via INTRAVENOUS
  Administered 2019-04-23: 170 mg via INTRAVENOUS

## 2019-04-23 MED ORDER — LIDOCAINE 2% (20 MG/ML) 5 ML SYRINGE
INTRAMUSCULAR | Status: DC | PRN
  Administered 2019-04-23: 80 mg via INTRAVENOUS

## 2019-04-23 SURGICAL SUPPLY — 69 items
BAG DECANTER FOR FLEXI CONT (MISCELLANEOUS) ×3 IMPLANT
BENZOIN TINCTURE PRP APPL 2/3 (GAUZE/BANDAGES/DRESSINGS) ×3 IMPLANT
BLADE SURG 11 STRL SS (BLADE) ×3 IMPLANT
BUR ACORN 6.0 PRECISION (BURR) ×2 IMPLANT
BUR ACORN 6.0MM PRECISION (BURR) ×1
CANISTER SUCT 3000ML PPV (MISCELLANEOUS) ×3 IMPLANT
CARTRIDGE OIL MAESTRO DRILL (MISCELLANEOUS) ×1 IMPLANT
CATH VENTRICULAR 9CM (Shunt) ×3 IMPLANT
CLIP RANEY DISP (INSTRUMENTS) IMPLANT
CLOSURE WOUND 1/2 X4 (GAUZE/BANDAGES/DRESSINGS) ×1
DERMABOND ADVANCED (GAUZE/BANDAGES/DRESSINGS) ×2
DERMABOND ADVANCED .7 DNX12 (GAUZE/BANDAGES/DRESSINGS) ×1 IMPLANT
DIFFUSER DRILL AIR PNEUMATIC (MISCELLANEOUS) ×3 IMPLANT
DRAPE INCISE IOBAN 85X60 (DRAPES) ×3 IMPLANT
DRAPE ORTHO SPLIT 77X108 STRL (DRAPES) ×2
DRAPE SURG 17X23 STRL (DRAPES) IMPLANT
DRAPE SURG ORHT 6 SPLT 77X108 (DRAPES) ×1 IMPLANT
DRSG OPSITE 4X5.5 SM (GAUZE/BANDAGES/DRESSINGS) ×6 IMPLANT
ELECT REM PT RETURN 9FT ADLT (ELECTROSURGICAL) ×3
ELECTRODE REM PT RTRN 9FT ADLT (ELECTROSURGICAL) ×1 IMPLANT
GAUZE SPONGE 4X4 12PLY STRL (GAUZE/BANDAGES/DRESSINGS) ×3 IMPLANT
GLOVE BIOGEL PI IND STRL 7.0 (GLOVE) ×2 IMPLANT
GLOVE BIOGEL PI IND STRL 7.5 (GLOVE) ×2 IMPLANT
GLOVE BIOGEL PI IND STRL 8 (GLOVE) ×2 IMPLANT
GLOVE BIOGEL PI INDICATOR 7.0 (GLOVE) ×4
GLOVE BIOGEL PI INDICATOR 7.5 (GLOVE) ×4
GLOVE BIOGEL PI INDICATOR 8 (GLOVE) ×4
GLOVE ECLIPSE 7.5 STRL STRAW (GLOVE) ×3 IMPLANT
GLOVE ECLIPSE 9.0 STRL (GLOVE) ×9 IMPLANT
GLOVE EXAM NITRILE XL STR (GLOVE) IMPLANT
GOWN STRL REUS W/ TWL LRG LVL3 (GOWN DISPOSABLE) IMPLANT
GOWN STRL REUS W/ TWL XL LVL3 (GOWN DISPOSABLE) IMPLANT
GOWN STRL REUS W/TWL 2XL LVL3 (GOWN DISPOSABLE) IMPLANT
GOWN STRL REUS W/TWL LRG LVL3 (GOWN DISPOSABLE)
GOWN STRL REUS W/TWL XL LVL3 (GOWN DISPOSABLE)
HEMOSTAT SURGICEL 2X14 (HEMOSTASIS) IMPLANT
KIT BASIN OR (CUSTOM PROCEDURE TRAY) ×3 IMPLANT
KIT TURNOVER KIT B (KITS) ×3 IMPLANT
MARKER SKIN DUAL TIP RULER LAB (MISCELLANEOUS) ×3 IMPLANT
NS IRRIG 1000ML POUR BTL (IV SOLUTION) ×3 IMPLANT
OIL CARTRIDGE MAESTRO DRILL (MISCELLANEOUS) ×3
PACK LAMINECTOMY NEURO (CUSTOM PROCEDURE TRAY) ×3 IMPLANT
PAD ARMBOARD 7.5X6 YLW CONV (MISCELLANEOUS) ×3 IMPLANT
PATTIES SURGICAL .5 X3 (DISPOSABLE) IMPLANT
RUBBERBAND STERILE (MISCELLANEOUS) IMPLANT
SHEATH PERITONEAL INTRO 46 (MISCELLANEOUS) ×3 IMPLANT
SHEATH PERITONEAL INTRO 61 (MISCELLANEOUS) IMPLANT
SHUNT STRATA 11 SNAP REG (Shunt) ×3 IMPLANT
SPONGE INTESTINAL PEANUT (DISPOSABLE) IMPLANT
SPONGE LAP 4X18 RFD (DISPOSABLE) ×3 IMPLANT
SPONGE SURGIFOAM ABS GEL SZ50 (HEMOSTASIS) IMPLANT
STAPLER VISISTAT 35W (STAPLE) ×3 IMPLANT
STRIP CLOSURE SKIN 1/2X4 (GAUZE/BANDAGES/DRESSINGS) ×2 IMPLANT
SUT CHROMIC 3 0 SH 27 (SUTURE) IMPLANT
SUT ETHILON 3 0 FSL (SUTURE) IMPLANT
SUT ETHILON 4 0 PS 2 18 (SUTURE) IMPLANT
SUT NURALON 4 0 TR CR/8 (SUTURE) IMPLANT
SUT SILK 0 TIES 10X30 (SUTURE) IMPLANT
SUT SILK 2 0 TIES 17X18 (SUTURE) ×2
SUT SILK 2-0 18XBRD TIE BLK (SUTURE) ×1 IMPLANT
SUT SILK 3 0 SH 30 (SUTURE) IMPLANT
SUT VIC AB 2-0 CT2 18 VCP726D (SUTURE) ×3 IMPLANT
SUT VIC AB 3-0 SH 8-18 (SUTURE) ×3 IMPLANT
SUT VICRYL 4-0 PS2 18IN ABS (SUTURE) IMPLANT
SYR 5ML LL (SYRINGE) IMPLANT
TOWEL GREEN STERILE (TOWEL DISPOSABLE) ×3 IMPLANT
TOWEL GREEN STERILE FF (TOWEL DISPOSABLE) ×3 IMPLANT
TRAY FOLEY MTR SLVR 16FR STAT (SET/KITS/TRAYS/PACK) IMPLANT
WATER STERILE IRR 1000ML POUR (IV SOLUTION) ×3 IMPLANT

## 2019-04-23 NOTE — Progress Notes (Signed)
Pt returned to Unit s/p VP shunt revision alert and oriented x 4 but forgetful of the day of the week. Moves all extremities . Dressing to  Left side of head and neck intact.            Vs stable wife at bedside. Pt denied n/v/ and pain . On clear liquid offered and tolerating well. Cardiac monitor replaced and verified  SR on the monitor. RN will continue to monitor.

## 2019-04-23 NOTE — Progress Notes (Signed)
Pt going to OR for surgery; report called off to short stay. Telemetry removed; pt transported off unit to OR. Delia Heady RN

## 2019-04-23 NOTE — Anesthesia Procedure Notes (Signed)
Procedure Name: Intubation Date/Time: 04/23/2019 4:36 PM Performed by: Lance Coon, CRNA Pre-anesthesia Checklist: Patient identified, Suction available, Emergency Drugs available, Patient being monitored and Timeout performed Patient Re-evaluated:Patient Re-evaluated prior to induction Oxygen Delivery Method: Circle system utilized Preoxygenation: Pre-oxygenation with 100% oxygen Induction Type: IV induction and Rapid sequence Laryngoscope Size: Miller and 3 Grade View: Grade II Tube type: Oral Tube size: 7.5 mm Number of attempts: 1 Airway Equipment and Method: Stylet Placement Confirmation: ETT inserted through vocal cords under direct vision,  positive ETCO2 and breath sounds checked- equal and bilateral Secured at: 22 cm Tube secured with: Tape Dental Injury: Teeth and Oropharynx as per pre-operative assessment

## 2019-04-23 NOTE — Anesthesia Preprocedure Evaluation (Signed)
Anesthesia Evaluation  Patient identified by MRN, date of birth, ID band Patient awake    Reviewed: Allergy & Precautions, NPO status , Patient's Chart, lab work & pertinent test results  Airway Mallampati: II  TM Distance: >3 FB Neck ROM: Full    Dental  (+) Teeth Intact, Dental Advisory Given   Pulmonary pneumonia,    Pulmonary exam normal breath sounds clear to auscultation       Cardiovascular hypertension, Normal cardiovascular exam Rhythm:Regular Rate:Normal     Neuro/Psych PSYCHIATRIC DISORDERS Anxiety S/p VP shunt placement  TIACVA    GI/Hepatic Neg liver ROS,   Endo/Other    Renal/GU negative Renal ROS     Musculoskeletal  (+) Arthritis ,   Abdominal   Peds  Hematology  (+) Blood dyscrasia (Plavix), ,   Anesthesia Other Findings   Reproductive/Obstetrics                             Anesthesia Physical  Anesthesia Plan  ASA: III  Anesthesia Plan: General   Post-op Pain Management:    Induction: Intravenous  PONV Risk Score and Plan: 2 and Ondansetron, Dexamethasone and Midazolam  Airway Management Planned: Oral ETT  Additional Equipment:   Intra-op Plan:   Post-operative Plan: Extubation in OR  Informed Consent: I have reviewed the patients History and Physical, chart, labs and discussed the procedure including the risks, benefits and alternatives for the proposed anesthesia with the patient or authorized representative who has indicated his/her understanding and acceptance.     Dental advisory given  Plan Discussed with: Anesthesiologist and CRNA  Anesthesia Plan Comments:         Anesthesia Quick Evaluation

## 2019-04-23 NOTE — Progress Notes (Signed)
Patient with intermittent headache overnight.  Some nausea and vomiting late yesterday evening.  Reports mild headache this morning.  Wife reports that he still unstable on his feet.  Awake and alert.  Oriented and fairly appropriate although a little bit more blunted than yesterday.  Follow-up head CT scan this morning demonstrates continued ventriculomegaly with significant peri-ependymal edema.  Although the radiologist thinks this may be slightly improved I am not convinced that there is much change and definitely it represents almost certain shunt malfunction.  I discussed situation with the patient and his wife.  I recommended we move forward with VP shunt revision.  If the shunt is working well proximally but there is trouble with distal runoff we may convert this to a ventriculoatrial shunt.  I discussed the risks and benefits involved with this procedure with the patient.  They understand.  They agree to proceed.

## 2019-04-23 NOTE — Progress Notes (Signed)
Pharmacy Antibiotic Note  Charles Marquez is a 63 y.o. male admitted on 04/21/2019 for VP shunt revision.  Pharmacy has been consulted for vancomycin dosing for post-op prophylaxis.  Plan: Vancomycin 1500 mg q 12 hrs F/u length of antibiotic therapy. F/u renal function, clinical course Vancomycin levels as needed.  Height: 5' 10.98" (180.3 cm) Weight: 194 lb 0.1 oz (88 kg) IBW/kg (Calculated) : 75.26  Temp (24hrs), Avg:98.5 F (36.9 C), Min:97 F (36.1 C), Max:99.2 F (37.3 C)  Recent Labs  Lab 04/21/19 1722  WBC 6.5  CREATININE 0.86    Estimated Creatinine Clearance: 96.1 mL/min (by C-G formula based on SCr of 0.86 mg/dL).    No Known Allergies  Antimicrobials this admission: Vancomycin 6/10 >   Dose adjustments this admission:  Microbiology results: 6/8 COVID - neg  Thank you for allowing pharmacy to be a part of this patient's care.  Marguerite Olea, [facility] Clinical Pharmacist Phone [phone]  04/23/2019 7:32 PM

## 2019-04-23 NOTE — Transfer of Care (Signed)
Immediate Anesthesia Transfer of Care Note  Patient: Charles Marquez  Procedure(s) Performed: Left occipital VP shunt revision with placement of Ventriculo-Atrial Shunt (Left Head)  Patient Location: PACU  Anesthesia Type:General  Level of Consciousness: awake and patient cooperative  Airway & Oxygen Therapy: Patient Spontanous Breathing  Post-op Assessment: Report given to RN and Post -op Vital signs reviewed and stable  Post vital signs: Reviewed and stable  Last Vitals:  Vitals Value Taken Time  BP 127/89 04/23/2019  6:01 PM  Temp    Pulse 70 04/23/2019  6:02 PM  Resp 13 04/23/2019  6:02 PM  SpO2 97 % 04/23/2019  6:02 PM  Vitals shown include unvalidated device data.  Last Pain:  Vitals:   04/23/19 1423  TempSrc:   PainSc: Asleep      Patients Stated Pain Goal: 3 (03/70/48 8891)  Complications: No apparent anesthesia complications

## 2019-04-23 NOTE — Brief Op Note (Signed)
04/21/2019 - 04/23/2019  5:54 PM  PATIENT:  Charles Marquez  63 y.o. male  PRE-OPERATIVE DIAGNOSIS:  VP shunt malfunction  POST-OPERATIVE DIAGNOSIS:  VP shunt malfunction  PROCEDURE:  Procedure(s): Left occipital VP shunt revision with placement of Ventriculo-Atrial Shunt (Left)  SURGEON:  Surgeon(s) and Role:    * Earnie Larsson, MD - Primary    * Jovita Gamma, MD - Assisting  PHYSICIAN ASSISTANT:   ASSISTANTSReinaldo Meeker, NP   ANESTHESIA:   general  EBL:  150 mL   BLOOD ADMINISTERED:none  DRAINS: none   LOCAL MEDICATIONS USED:  NONE  SPECIMEN:  No Specimen  DISPOSITION OF SPECIMEN:  N/A  COUNTS:  YES  TOURNIQUET:  * No tourniquets in log *  DICTATION: .Dragon Dictation  PLAN OF CARE: Admit to inpatient   PATIENT DISPOSITION:  PACU - hemodynamically stable.   Delay start of Pharmacological VTE agent (>24hrs) due to surgical blood loss or risk of bleeding: yes

## 2019-04-23 NOTE — Op Note (Signed)
Date of procedure: 04/23/2019  Date of dictation: Same  Service: Neurosurgery  Preoperative diagnosis: Left occipital VP shunt malfunction  Postoperative diagnosis: Same  Procedure Name: Left occipital VP shunt revision with conversion to left occipital VA shunt  Surgeon:Brandolyn Shortridge A.Kingslee Dowse, M.D.  Asst. Surgeon: Roena Malady NP  Anesthesia: General  Indication: 63 year old male with history of shunted hydrocephalus presents with increasing headache unsteadiness and declining cognition.  Work-up demonstrates evidence of progressive ventriculomegaly with significant periependymal edema.  Patient presents now for VP shunt revision.  Given prior difficulty with peritoneal catheter we are considering VA shunt if the distal catheter continues to perform poorly.  Operative note: After induction anesthesia, patient position supine with head turned towards the right and left shoulder elevated.  Left occipital region neck and upper chest was prepped and draped sterilely.  Incision was made on the left occipital region.  There was obvious CSF flowing around the catheter and proximal valve connection consistent with high pressure.  The valve and proximal catheter was disassembled and there was brisk flow through the ventricular catheter confirming no proximal malfunction.  The valve was dissected free and inspected.  It was found to be free of any defects.  It pumped and refilled easily.  The valve was cut away and the distal catheter was explored.  A blunt needle was introduced into the distal catheter.  I irrigated through the distal catheter.  The distal catheter had increased pressure.  Runoff was poor.  At this point I think the patient's symptoms are secondary to increased intraperitoneal pressure and poor CSF resorption.  We decided to remove the distal catheter and progressed to a new VA shunt system.  Anatomical landmarks were checked.  Using landmark of the 2 heads of the sternocleidomastoid muscle  and utilizing intraoperative ultrasound the jugular vein was visualized and cannulated using a needle.  A wire was passed through the needle and then using the Seldinger technique a 12 French peel-away introducer was passed into the jugular vein.  A new Medtronic strata valve with distal catheter was then passed into position.  This was attached to the proximal catheter.  There was good flow through the distal tube.  The tube was trimmed to an appropriate length.  The distal catheter was then passed through the introducer through the neck wound and passed into the jugular vein.  The introducer was completely removed.  Pressure was held over the puncture site.  Wound was then closed with Vicryl sutures in both the neck and occipital region.  Sterile dressing were applied.  Intraoperative x-rays were of limited value secondary to artifact from the bed however it did appear that the catheter passed into the central venous system and did not extend into the heart.  A follow-up x-ray will be obtained in the recovery room.

## 2019-04-24 ENCOUNTER — Encounter (HOSPITAL_COMMUNITY): Payer: Self-pay | Admitting: Neurosurgery

## 2019-04-24 NOTE — Anesthesia Postprocedure Evaluation (Signed)
Anesthesia Post Note  Patient: Charles Marquez  Procedure(s) Performed: Left occipital VP shunt revision with placement of Ventriculo-Atrial Shunt (Left Head)     Patient location during evaluation: PACU Anesthesia Type: General Level of consciousness: awake and alert Pain management: pain level controlled Vital Signs Assessment: post-procedure vital signs reviewed and stable Respiratory status: spontaneous breathing, nonlabored ventilation, respiratory function stable and patient connected to nasal cannula oxygen Cardiovascular status: blood pressure returned to baseline and stable Postop Assessment: no apparent nausea or vomiting Anesthetic complications: no    Last Vitals:  Vitals:   04/23/19 2343 04/24/19 0412  BP: 120/79 115/78  Pulse: 65 64  Resp: 18 16  Temp: 36.8 C 36.9 C  SpO2: 95% 95%    Last Pain:  Vitals:   04/24/19 0412  TempSrc: Oral  PainSc:                  Catalina Gravel

## 2019-04-24 NOTE — Progress Notes (Signed)
Pt discharge education and instructions completed with pt and spouse [name] at bedside; both voices understanding and denies any questions. Pt IV and telemetry removed; head and neck incision dsg remains intact and unremarkable. Pt discharge home with spouse to transport him home. Pt transported off unit via wheelchair with belongings and spouse to the side by volunteer. Delia Heady RN

## 2019-04-24 NOTE — Discharge Summary (Signed)
Physician Discharge Summary  Patient ID: Charles Marquez MRN: [id number] DOB/AGE: July 06, 1956 63 y.o.  Admit date: 04/21/2019 Discharge date: 04/24/2019  Admission Diagnoses:  Discharge Diagnoses:  Active Problems:   Shunt malfunction   Discharged Condition: good  Hospital Course: Patient admitted to hospital for evaluation of probable VP shunt malfunction.  Patient was observed for 24 hours and it was determined that his VP shunt system was not performing optimally.  He was taken to the operating room for VP shunt revision.  Patient's distal catheter was found to be performing poorly.  The patient shunt was converted to a ventriculoatrial shunt.  He tolerated this well.  He currently has no headaches.  His mental status improved.  He is ambulating well.  His wounds are clean and dry.  Ready for discharge home.  Consults:   Significant Diagnostic Studies:   Treatments:   Discharge Exam: Blood pressure 115/78, pulse 64, temperature 98.5 F (36.9 C), temperature source Oral, resp. rate 16, height 5' 10.98" (1.803 m), weight 88 kg, SpO2 95 %. Awake and alert.  Oriented and appropriate.  Cranial nerve function intact.  Motor and sensory function normal.  Wound clean and dry.  Chest and abdomen benign.  Disposition: Discharge disposition: 01-Home or Self Care        Allergies as of 04/24/2019   No Known Allergies     Medication List    TAKE these medications   acetaminophen 500 MG tablet Commonly known as: TYLENOL Take 1,000 mg by mouth every 6 (six) hours as needed for moderate pain. What changed: Another medication with the same name was removed. Continue taking this medication, and follow the directions you see here.   atorvastatin 40 MG tablet Commonly known as: LIPITOR Take 1 tablet (40 mg total) by mouth daily.   clopidogrel 75 MG tablet Commonly known as: PLAVIX Take 75 mg by mouth daily.   hydroxypropyl methylcellulose / hypromellose 2.5 % ophthalmic  solution Commonly known as: ISOPTO TEARS / GONIOVISC Place 1 drop into both eyes as needed for dry eyes.   PreserVision AREDS 2 Caps Take 1 capsule by mouth 2 (two) times daily.   ZYRTEC PO Take 10 mg by mouth daily as needed (seasonal allergies).        Signed: Cooper Render Pilar Corrales 04/24/2019, 8:16 AM

## 2019-04-24 NOTE — Discharge Instructions (Signed)

## 2019-04-25 ENCOUNTER — Ambulatory Visit: Payer: BC Managed Care – PPO | Admitting: Physical Therapy

## 2019-05-01 ENCOUNTER — Other Ambulatory Visit: Payer: Self-pay

## 2019-05-01 ENCOUNTER — Ambulatory Visit: Payer: BC Managed Care – PPO | Admitting: Physical Therapy

## 2019-05-01 DIAGNOSIS — R2689 Other abnormalities of gait and mobility: Secondary | ICD-10-CM

## 2019-05-01 DIAGNOSIS — R2681 Unsteadiness on feet: Secondary | ICD-10-CM | POA: Diagnosis present

## 2019-05-01 DIAGNOSIS — R29818 Other symptoms and signs involving the nervous system: Secondary | ICD-10-CM

## 2019-05-01 NOTE — Patient Instructions (Signed)
Access Code: [email or web address]  --AQUATIC EXERCISES URL: [email or web address]  Date: 05/01/2019  Prepared by: Misty Stanley   Exercises Standing Hip Flexion Extension - 10 reps - 2 sets - 1x daily - 7x weekly High Knee Balance with Backward Walking - 10 reps - 2 sets - 1x daily - 7x weekly Alternating Forward Lunge - 10 reps - 2 sets - 1x daily - 7x weekly Power Skipping - 10 reps - 2 sets - 1x daily - 7x weekly Jumping Jacks - 10 reps - 2 sets - 1x daily - 7x weekly Tandem Stance on Noodle - 4 sets - 10-15 seconds hold - 1x daily - 7x weekly Tree Pose - 4 sets - 10 seconds hold - 1x daily - 7x weekly Freeing - 10 reps - 2 sets - 1x daily - 7x weekly Kneeling on Flotation - 10 reps - 2 sets - 1x daily - 7x weekly

## 2019-05-01 NOTE — Therapy (Signed)
[facility] [address] [place], [place], [address] Phone: [phone]   Fax:  [phone]  Physical Therapy Treatment  Patient Details  Name: Charles Marquez MRN: [id number] Date of Birth: 08/03/1956 Referring Provider (PT): Kathaleen MaserHenry A. Pool, MD   Encounter Date: 05/01/2019   CLINIC OPERATION CHANGES: Outpatient Neuro Rehab is open at lower capacity following universal masking, social distancing, and patient screening.  The patient's COVID risk of complications score is 3.   PT End of Session - 05/01/19 1216    Visit Number  4    Number of Visits  7    Date for PT Re-Evaluation  05/21/19    Authorization Type  BCBS OTHER     Authorization Time Period  PT/OT/ST/Hydro therapy combined: 90 VL    PT Start Time  1100    PT Stop Time  1145    PT Time Calculation (min)  45 min    Activity Tolerance  Patient tolerated treatment well    Behavior During Therapy  WFL for tasks assessed/performed       Past Medical History:  Diagnosis Date  . Anxiety   . Hypercholesteremia   . Stroke (HCC)    tia's  . TIA (transient ischemic attack)    09.15    Past Surgical History:  Procedure Laterality Date  . Fractured arm Left 12  . HERNIA REPAIR Right 3/12  . LAPAROSCOPIC REVISION VENTRICULAR-PERITONEAL (V-P) SHUNT N/A 08/26/2018   Procedure: LAPAROSCOPIC INSERTION VENTRICULAR-PERITONEAL (V-P) SHUNT;  Surgeon: Julio SicksPool, Henry, MD;  Location: [facility] OR;  Service: Neurosurgery;  Laterality: N/A;  . LAPAROSCOPIC REVISION VENTRICULAR-PERITONEAL (V-P) SHUNT N/A 01/17/2019   Procedure: LAPAROSCOPIC REVISION VENTRICULAR-PERITONEAL (V-P) SHUNT;  Surgeon: Manus Ruddsuei, Matthew, MD;  Location: [facility] OR;  Service: General;  Laterality: N/A;  . LOOP RECORDER INSERTION N/A 04/10/2017   Procedure: Loop Recorder Insertion;  Surgeon: Hillis RangeAllred, James, MD;  Location: [facility] INVASIVE CV LAB;  Service: Cardiovascular;  Laterality: N/A;  . SHUNT REMOVAL Right 03/13/2016    Procedure: SHUNT REMOVAL;  Surgeon: Julio SicksHenry Pool, MD;  Location: [facility] NEURO ORS;  Service: Neurosurgery;  Laterality: Right;  . SHUNT REMOVAL Right 08/09/2018   Procedure: SHUNT REMOVAL With Placement of Ventricular Catheter;  Surgeon: Lisbeth RenshawNundkumar, Neelesh, MD;  [facility];  Service: Neurosurgery;  Laterality: Right;  . SHUNT REVISION Right 08/05/2018   Procedure: SHUNT REVISION;  Surgeon: Julio SicksPool, Henry, MD;  [facility];  Service: Neurosurgery;  Laterality: Right;  . SHUNT REVISION Left 12/06/2018   Procedure: Shunt Revision - left;  Surgeon: Julio SicksPool, Henry, MD;  [facility];  Service: Neurosurgery;  Laterality: Left;  Shunt Revision - left  . SHUNT REVISION N/A 12/09/2018   Procedure: SHUNT REVISION;  Surgeon: Julio SicksPool, Henry, MD;  [facility];  Service: Neurosurgery;  Laterality: N/A;  . SHUNT REVISION Left 01/17/2019   Procedure: Shunt Revision - left;  Surgeon: Julio SicksPool, Henry, MD;  [facility];  Service: Neurosurgery;  Laterality: Left;  . SHUNT REVISION Left 04/23/2019   Procedure: Left occipital VP shunt revision with placement of Ventriculo-Atrial Shunt;  Surgeon: Julio SicksPool, Henry, MD;  [facility];  Service: Neurosurgery;  Laterality: Left;  . SHUNT REVISION VENTRICULAR-PERITONEAL Left 08/26/2018   Procedure: SHUNT REVISION VENTRICULAR-PERITONEAL;  Surgeon: Julio SicksPool, Henry, MD;  [facility];  Service: Neurosurgery;  Laterality: Left;  . SHUNT REVISION VENTRICULAR-PERITONEAL Left 09/02/2018   Procedure: Left Occipital VP shunt revision;  Surgeon: Julio SicksPool, Henry, MD;  [facility];  Service: Neurosurgery;  Laterality:  Left;  . VASECTOMY  10/02/1997  . VENTRICULOPERITONEAL SHUNT Right 12/18/2014   Procedure: Shunt Placment - right occipital VP shunt ;  Surgeon: Temple PaciniHenry A Pool, MD;  Location: [facility] NEURO [facility]S;  Service: Neurosurgery;  Laterality: Right;  Shunt Placment - right occipital VP shunt   . VENTRICULOPERITONEAL SHUNT Right 07/22/2018   Procedure: Shunt Placment right occipital;  Surgeon: Julio SicksPool,  Henry, MD;  [facility];  Service: Neurosurgery;  Laterality: Right;  . VENTRICULOPERITONEAL SHUNT Left 08/26/2018   Procedure: LEFT SIDED VENTRICULAR-PERITONEAL SHUNT;  Surgeon: Julio SicksPool, Henry, MD;  [facility];  Service: Neurosurgery;  Laterality: Left;  [name]. VENTRICULOSTOMY Right 08/09/2018   Procedure: VENTRICULOSTOMY;  Surgeon: Lisbeth RenshawNundkumar, Neelesh, MD;  [facility];  Service: Neurosurgery;  Laterality: Right;    There were no vitals filed for this visit.  Subjective Assessment - 05/01/19 1106    Subjective  Pt returns after being admitted to hospital for [facility]unt replacement [facility].  Pt given clearance to resume physical therapy without any restrictions.  HA have resolved and is not reporting any new symptoms or worsening of imbalance.    Patient is accompained by:  Family member    Currently in Pain?  No/denies                       [facility] Adult PT Treatment/Exercise - 05/01/19 1159      Neuro Re-ed    Neuro Re-ed Details   Lower visual field training walking forwards down hallway x 6 reps with therapist holding various colored cards in lower visual fields (mid, R, L) and having pt ID color; added in retro gait x 4 reps with greater difficulty attending to lower visual field.  Also added in coordination task of tossing/catching small ball keeping it low and then passing from hand to hand while ambulating forwards and backwards; required supervision-min A      Exercises   Exercises  Other Exercises    Other Exercises   Performed multiple LE strengthening, aerobic and balance exercises at countertop to simulate edge of pool when reviewing initial aquatic exercises.  Refer to Medbridge exercises below.          Access Code: [email or web address]  URL: [email or web address]  Date: 05/01/2019  Prepared by: Bufford LopeAudra Gabriella Guile   Exercises Standing Hip Flexion Extension - 10 reps - 2 sets - 1x daily - 7x weekly High Knee Balance with Backward Walking - 10 reps - 2  sets - 1x daily - 7x weekly Alternating Forward Lunge - 10 reps - 2 sets - 1x daily - 7x weekly Power Skipping - 10 reps - 2 sets - 1x daily - 7x weekly Jumping Jacks - 10 reps - 2 sets - 1x daily - 7x weekly Tandem Stance on Noodle - 4 sets - 10-15 seconds hold - 1x daily - 7x weekly Tree Pose - 4 sets - 10 seconds hold - 1x daily - 7x weekly Freeing - 10 reps - 2 sets - 1x daily - 7x weekly Kneeling on Flotation - 10 reps - 2 sets - 1x daily - 7x weekly      PT Education - 05/01/19 1215    Education Details  hold on side plank for one more week; exercises to perform in the pool; walking and identifying cards/objects in lower visual field, You Tube driving simulator and locating objects in visual fields on computer screen    Person(s) Educated  Patient;Spouse    Methods  Explanation;Demonstration;Handout  Comprehension  Verbalized understanding;Returned demonstration       PT Short Term Goals - 04/06/19 1430      PT SHORT TERM GOAL #1   Title  = LTG        PT Long Term Goals - 04/06/19 1442      PT LONG TERM GOAL #1   Title  Pt will be independent and compliant with performing PROGRESSED HEP to improve balance, strength, and functional mobility.    Time  6    Period  Weeks    Status  Revised    Target Date  05/21/19      PT LONG TERM GOAL #2   Title  Pt will demonstrate ability to perform safe sit <> stand from low surface as demonstrated in five time sit to stand from <17" surface without use of UE in </= 10 seconds    Baseline  12 seconds using UE    Time  6    Period  Weeks    Status  New    Target Date  05/21/19      PT LONG TERM GOAL #3   Title  Pt will demonstrate ability to perform jogging forwards and backwards with head turns x 100' each with supervision in controlled environment to simulate basketball referee activities.    Time  6    Period  Weeks    Status  New    Target Date  05/21/19      PT LONG TERM GOAL #4   Title  Pt will improve FGA score to  >/=28/30 indicating improvement in functional mobility.     Baseline  26/30 on 5/22    Time  6    Period  Weeks    Status  Revised    Target Date  05/21/19            Plan - 05/01/19 1216    Clinical Impression Statement  Pt returns after having another shunt replacement surgery with no apparent residual deficits.  Continued balance training providing pt with exercises to perform in the pool at home to focus on aerobic conditioning, balance and LE/UE/core strengthening.  Also continued dual task of ambulating and visually identifying objects in lower visual fields.  Pt tolerated well; will continue to address and progress towards LTG.  2 more visits added to schedule.    Rehab Potential  Good    PT Frequency  1x / week    PT Duration  6 weeks    PT Treatment/Interventions  ADLs/Self Care Home Management;Therapeutic activities;Therapeutic exercise;Balance training;Neuromuscular re-education;Patient/family education;Stair training;Gait training;Functional mobility training;Cognitive remediation;Aquatic Therapy    PT Next Visit Plan  Return to side plank.  Did they try aquatic exercises?  Driving simulator on YouTube.  balance on rockerboard and ramp; pt still has lower visual field impairments - work on walking and locating low objects (on floor) or negotiating obstacles on floor, floor ladder for agility, balance with eyes closed    PT Home Exercise Plan  [email or web address]     Consulted and Agree with Plan of Care  Patient;Family member/caregiver    Family Member Consulted  wife       Patient will benefit from skilled therapeutic intervention in order to improve the following deficits and impairments:  Abnormal gait, Decreased safety awareness, Decreased balance, Decreased cognition, Decreased strength, Difficulty walking  Visit Diagnosis: 1. Other symptoms and signs involving the nervous system   2. Unsteadiness on feet   3. Other abnormalities of gait and  mobility        Problem  List Patient Active Problem List   Diagnosis Date Noted  . Visual disturbance   . Slow transit constipation   . Hypoalbuminemia due to protein-calorie malnutrition ([place])   . Acute blood loss anemia   . S/P VP shunt   . Hydrocephalus ([place]) 08/30/2018  . Dyslipidemia   . History of CVA (cerebrovascular accident)   . Benign essential HTN   . Tachycardia   . Leukocytosis   . Hyponatremia   . Hypokalemia   . 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 Infection of ventricular shunt ([place]) 08/09/2018  . Bacterial meningitis 08/09/2018  . TIA (transient ischemic attack) 01/29/2017  . Acute encephalopathy   . Shunt malfunction 03/13/2016  . 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 Hyperlipidemia 10/20/2014  . Degenerative disc disease, lumbar 04/15/2013  . Routine general medical examination at a health care facility 07/23/2012  . DISTURBANCE OF SKIN SENSATION 10/05/2008  . HYPERLIPIDEMIA 01/01/2008  . MYCOPLASMA PNEUMONIA 01/01/2008    Rico Junker, PT, DPT 05/01/19    12:22 PM    [facility] [address] [place], [place], [address] Phone: [phone]   Fax:  [phone]  Name: Nader Boys MRN: [id number] Date of Birth: Mar 30, 1956

## 2019-05-08 ENCOUNTER — Ambulatory Visit: Payer: BC Managed Care – PPO | Admitting: Physical Therapy

## 2019-05-08 ENCOUNTER — Other Ambulatory Visit: Payer: Self-pay

## 2019-05-08 ENCOUNTER — Encounter: Payer: Self-pay | Admitting: Physical Therapy

## 2019-05-08 DIAGNOSIS — R29818 Other symptoms and signs involving the nervous system: Secondary | ICD-10-CM

## 2019-05-08 DIAGNOSIS — R2681 Unsteadiness on feet: Secondary | ICD-10-CM

## 2019-05-08 DIAGNOSIS — R2689 Other abnormalities of gait and mobility: Secondary | ICD-10-CM

## 2019-05-08 NOTE — Therapy (Signed)
[facility] [address] [place], [place], [address] Phone: [phone]   Fax:  [phone]  Physical Therapy Treatment  Patient Details  Name: Charles Marquez MRN: [id number] Date of Birth: January 31, 1956 Referring Provider (PT): Kathaleen MaserHenry A. Pool, MD   Encounter Date: 05/08/2019   CLINIC OPERATION CHANGES: Outpatient Neuro Rehab is open at lower capacity following universal masking, social distancing, and patient screening.  The patient's COVID risk of complications score is 3.   PT End of Session - 05/08/19 1204    Visit Number  5    Number of Visits  7    Date for PT Re-Evaluation  05/21/19    Authorization Type  BCBS OTHER     Authorization Time Period  PT/OT/ST/Hydro therapy combined: 90 VL    PT Start Time  1100    PT Stop Time  1144    PT Time Calculation (min)  44 min    Activity Tolerance  Patient tolerated treatment well    Behavior During Therapy  WFL for tasks assessed/performed       Past Medical History:  Diagnosis Date  . Anxiety   . Hypercholesteremia   . Stroke (HCC)    tia's  . TIA (transient ischemic attack)    09.15    Past Surgical History:  Procedure Laterality Date  . Fractured arm Left 12  . HERNIA REPAIR Right 3/12  . LAPAROSCOPIC REVISION VENTRICULAR-PERITONEAL (V-P) SHUNT N/A 08/26/2018   Procedure: LAPAROSCOPIC INSERTION VENTRICULAR-PERITONEAL (V-P) SHUNT;  Surgeon: Julio SicksPool, Henry, MD;  Location: [facility] OR;  Service: Neurosurgery;  Laterality: N/A;  . LAPAROSCOPIC REVISION VENTRICULAR-PERITONEAL (V-P) SHUNT N/A 01/17/2019   Procedure: LAPAROSCOPIC REVISION VENTRICULAR-PERITONEAL (V-P) SHUNT;  Surgeon: Manus Ruddsuei, Matthew, MD;  Location: [facility] OR;  Service: General;  Laterality: N/A;  . LOOP RECORDER INSERTION N/A 04/10/2017   Procedure: Loop Recorder Insertion;  Surgeon: Hillis RangeAllred, James, MD;  Location: [facility] INVASIVE CV LAB;  Service: Cardiovascular;  Laterality: N/A;  . SHUNT REMOVAL Right 03/13/2016   Procedure: SHUNT REMOVAL;  Surgeon: Julio SicksHenry Pool, MD;  Location: [facility] NEURO ORS;  Service: Neurosurgery;  Laterality: Right;  . SHUNT REMOVAL Right 08/09/2018   Procedure: SHUNT REMOVAL With Placement of Ventricular Catheter;  Surgeon: Lisbeth RenshawNundkumar, Neelesh, MD;  [facility];  Service: Neurosurgery;  Laterality: Right;  . SHUNT REVISION Right 08/05/2018   Procedure: SHUNT REVISION;  Surgeon: Julio SicksPool, Henry, MD;  [facility];  Service: Neurosurgery;  Laterality: Right;  . SHUNT REVISION Left 12/06/2018   Procedure: Shunt Revision - left;  Surgeon: Julio SicksPool, Henry, MD;  [facility];  Service: Neurosurgery;  Laterality: Left;  Shunt Revision - left  . SHUNT REVISION N/A 12/09/2018   Procedure: SHUNT REVISION;  Surgeon: Julio SicksPool, Henry, MD;  [facility];  Service: Neurosurgery;  Laterality: N/A;  . SHUNT REVISION Left 01/17/2019   Procedure: Shunt Revision - left;  Surgeon: Julio SicksPool, Henry, MD;  [facility];  Service: Neurosurgery;  Laterality: Left;  . SHUNT REVISION Left 04/23/2019   Procedure: Left occipital VP shunt revision with placement of Ventriculo-Atrial Shunt;  Surgeon: Julio SicksPool, Henry, MD;  [facility];  Service: Neurosurgery;  Laterality: Left;  . SHUNT REVISION VENTRICULAR-PERITONEAL Left 08/26/2018   Procedure: SHUNT REVISION VENTRICULAR-PERITONEAL;  Surgeon: Julio SicksPool, Henry, MD;  [facility];  Service: Neurosurgery;  Laterality: Left;  . SHUNT REVISION VENTRICULAR-PERITONEAL Left 09/02/2018   Procedure: Left Occipital VP shunt revision;  Surgeon: Julio SicksPool, Henry, MD;  [facility];  Service: Neurosurgery;  Laterality: Left;  .  VASECTOMY  10/02/1997  . VENTRICULOPERITONEAL SHUNT Right 12/18/2014   Procedure: Shunt Placment - right occipital VP shunt ;  Surgeon: Charlie Pitter, MD;  Location: [place] NEURO ORS;  Service: Neurosurgery;  Laterality: Right;  Shunt Placment - right occipital VP shunt   . VENTRICULOPERITONEAL SHUNT Right 07/22/2018   Procedure: Shunt Placment right occipital;  Surgeon: Earnie Larsson, MD;  Location: [place];  Service: Neurosurgery;  Laterality: Right;  . VENTRICULOPERITONEAL SHUNT Left 08/26/2018   Procedure: LEFT SIDED VENTRICULAR-PERITONEAL SHUNT;  Surgeon: Earnie Larsson, MD;  Location: [place];  Service: Neurosurgery;  Laterality: Left;  [name] VENTRICULOSTOMY Right 08/09/2018   Procedure: VENTRICULOSTOMY;  Surgeon: Consuella Lose, MD;  Location: [place];  Service: Neurosurgery;  Laterality: Right;    There were no vitals filed for this visit.  Subjective Assessment - 05/08/19 1108    Subjective  Hasn't been able to get in the pool due to weather.  Is going to [facility] in a couple weeks, asking about quarantine when he returns.  No issues or changes to report.    Patient is accompained by:  Family member    Currently in Pain?  No/denies                       [facility] Adult PT Treatment/Exercise - 05/08/19 1202      Therapeutic Activites    Therapeutic Activities  Other Therapeutic Activities    Other Therapeutic Activities  demonstrated how to use driving simulator online having pt view simulator and wife asking questions about environment in video and having pt attend to various visual fields.      Exercises   Exercises  Lumbar      Lumbar Exercises: Sidelying   Other Sidelying Lumbar Exercises  Closed chain core and hip ABD strengthening with modified side planks with knees bent.  Performed 2 sets x 5 reps each side x 6 second hold          Balance Exercises - 05/08/19 1157      Balance Exercises: Standing   Rockerboard  Anterior/posterior;Head turns;EO;Intermittent UE support;10 reps   in modified SLS, one foot propped on step   Balance Beam  dynamic balance on blue foam beam with feet apart, feet together, R/L staggered stance x 2 sets each x 30 reps of dynamic UE movements with Zoom Ball.  Therapist provided min A for balance    Other Standing Exercises  On rockerboard placed anterior/posterior performing 10 reps R and L taps forwards to 4" step,  6" and then 8" for SLS training on unstable surface.  Therapist provided min A and cues to slow down and control weight shift and taps        PT Education - 05/08/19 1204    Education Details  re start side plank, driving simulator    Person(s) Educated  Patient;Spouse    Methods  Explanation;Demonstration    Comprehension  Verbalized understanding;Returned demonstration       PT Short Term Goals - 04/06/19 1430      PT SHORT TERM GOAL #1   Title  = LTG        PT Long Term Goals - 04/06/19 1442      PT LONG TERM GOAL #1   Title  Pt will be independent and compliant with performing PROGRESSED HEP to improve balance, strength, and functional mobility.    Time  6    Period  Weeks    Status  Revised  Target Date  05/21/19      PT LONG TERM GOAL #2   Title  Pt will demonstrate ability to perform safe sit <> stand from low surface as demonstrated in five time sit to stand from <17" surface without use of UE in </= 10 seconds    Baseline  12 seconds using UE    Time  6    Period  Weeks    Status  New    Target Date  05/21/19      PT LONG TERM GOAL #3   Title  Pt will demonstrate ability to perform jogging forwards and backwards with head turns x 100' each with supervision in controlled environment to simulate basketball referee activities.    Time  6    Period  Weeks    Status  New    Target Date  05/21/19      PT LONG TERM GOAL #4   Title  Pt will improve FGA score to >/=28/30 indicating improvement in functional mobility.     Baseline  26/30 on 5/22    Time  6    Period  Weeks    Status  Revised    Target Date  05/21/19            Plan - 05/08/19 1205    Clinical Impression Statement  Treatment session focused on continued education regarding use of visual technology for attention to visual fields and selective attention in busier environments.  Also continued higher level balance and balance reaction training on compliant and unstable surfaces while  performing dynamic UE movement, SLS and wit head movements.  Required supervision-min A overall and pt demonstrated improved balance reactions to self-correct.  Will continue to address in order to progress towards LTG.    Rehab Potential  Good    PT Frequency  1x / week    PT Duration  6 weeks    PT Treatment/Interventions  ADLs/Self Care Home Management;Therapeutic activities;Therapeutic exercise;Balance training;Neuromuscular re-education;Patient/family education;Stair training;Gait training;Functional mobility training;Cognitive remediation;Aquatic Therapy    PT Next Visit Plan  In [place] - Did they try aquatic exercises?  rockerboard/balance beam with EC, head turns.  Tandem and backwards walking on variety of surfaces.  floor ladder for agility, balance with eyes closed    PT Home Exercise Plan  [email or web address]     Consulted and Agree with Plan of Care  Patient;Family member/caregiver    Family Member Consulted  wife       Patient will benefit from skilled therapeutic intervention in order to improve the following deficits and impairments:  Abnormal gait, Decreased safety awareness, Decreased balance, Decreased cognition, Decreased strength, Difficulty walking  Visit Diagnosis: 1. Other symptoms and signs involving the nervous system   2. Unsteadiness on feet   3. Other abnormalities of gait and mobility        Problem List Patient Active Problem List   Diagnosis Date Noted  . Visual disturbance   . Slow transit constipation   . Hypoalbuminemia due to protein-calorie malnutrition (HCC)   . Acute blood loss anemia   . S/P VP shunt   . Hydrocephalus (HCC) 08/30/2018  . Dyslipidemia   . History of CVA (cerebrovascular accident)   . Benign essential HTN   . Tachycardia   . Leukocytosis   . Hyponatremia   . Hypokalemia   . 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 Bacterial meningitis 08/09/2018  . TIA (transient ischemic attack)  01/29/2017  .  Acute encephalopathy   . Shunt malfunction 03/13/2016  . 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 Hyperlipidemia 10/20/2014  . Degenerative disc disease, lumbar 04/15/2013  . Routine general medical examination at a health care facility 07/23/2012  . DISTURBANCE OF SKIN SENSATION 10/05/2008  . HYPERLIPIDEMIA 01/01/2008  . MYCOPLASMA PNEUMONIA 01/01/2008    Dierdre HighmanAudra F Kayston Jodoin, PT, DPT 05/08/19    12:11 PM    [facility] [address] [place], [place], [address] Phone: [phone]   Fax:  [phone]  Name: Charles Marquez MRN: [id number] Date of Birth: 15-Feb-1956

## 2019-05-28 ENCOUNTER — Encounter: Payer: Self-pay | Admitting: Physical Therapy

## 2019-05-28 ENCOUNTER — Other Ambulatory Visit: Payer: Self-pay

## 2019-05-28 ENCOUNTER — Ambulatory Visit: Payer: BC Managed Care – PPO | Attending: Physical Medicine & Rehabilitation | Admitting: Physical Therapy

## 2019-05-28 DIAGNOSIS — R29818 Other symptoms and signs involving the nervous system: Secondary | ICD-10-CM | POA: Diagnosis present

## 2019-05-28 DIAGNOSIS — R2689 Other abnormalities of gait and mobility: Secondary | ICD-10-CM | POA: Insufficient documentation

## 2019-05-28 DIAGNOSIS — R2681 Unsteadiness on feet: Secondary | ICD-10-CM

## 2019-05-28 NOTE — Patient Instructions (Addendum)
Access Code: [email or web address]  URL: [email or web address]  Date: 05/28/2019  Prepared by: Misty Stanley   Exercises Walking Tandem Stance - 4 sets - 2x daily - 5x weekly Side Step with Agility Ladder - 3 sets - 2x daily - 5x weekly Standing Terminal Knee Extension with Resistance - 8 reps - 2 sets - 5 second hold - 2x daily - 5x weekly Isometric Gluteus Medius at Wall - 2 sets - 15-20 hold - 1x daily - 7x weekly Tandem Stance - 4 sets - 10 reps - 2x daily - 5x weekly Modified Side Plank with Hip Abduction - 5 reps - 2 sets - 10 seconds hold - 1x daily - 5x weekly Side Plank (without feet on step) Legs Straight - 5 reps - 2 sets - 10 seconds hold - 1x daily - 5x weekly Squat on Tilt Board Lateral Pivot - 10 reps - 2 sets - 1x daily - 7x weekly Single Leg Stance on Rocker Board - 4 sets - 10 second hold - 1x daily - 7x weekly Front to Back Standing Balance on Rocker Board with Unilateral Counter Support - 2 sets - 10 seconds hold - 1x daily - 7x weekly

## 2019-05-28 NOTE — Therapy (Signed)
[facility] [address] [place], [place], [address] Phone: [phone]   Fax:  [phone]  Physical Therapy Treatment  Patient Details  Name: Charles Marquez MRN: [id number] Date of Birth: Oct 20, 1956 Referring Provider (PT): Cooper Render. Pool, MD   Encounter Date: 05/28/2019   CLINIC OPERATION CHANGES: Outpatient Neuro Rehab is open at lower capacity following universal masking, social distancing, and patient screening.  The patient's COVID risk of complications score is 3.   PT End of Session - 05/28/19 1100    Visit Number  6    Number of Visits  7    Date for PT Re-Evaluation  06/11/19   extended due to missed visits   Authorization Type  BC[employer] OTHER     Authorization Time Period  PT/OT/ST/Hydro therapy combined: 90 VL    PT Start Time  0905    PT Stop Time  0950    PT Time Calculation (min)  45 min    Activity Tolerance  Patient tolerated treatment well    Behavior During Therapy  [facility] for tasks assessed/performed       Past Medical History:  Diagnosis Date  . Anxiety   . Hypercholesteremia   . Stroke ([place])    tia's  . TIA (transient ischemic attack)    09.15    Past Surgical History:  Procedure Laterality Date  . Fractured arm Left 12  . HERNIA REPAIR Right 3/12  . LAPAROSCOPIC REVISION VENTRICULAR-PERITONEAL (V-P) SHUNT N/A 08/26/2018   Procedure: LAPAROSCOPIC INSERTION VENTRICULAR-PERITONEAL (V-P) SHUNT;  Surgeon: Earnie Larsson, MD;  Location: [facility];  Service: Neurosurgery;  Laterality: N/A;  . LAPAROSCOPIC REVISION VENTRICULAR-PERITONEAL (V-P) SHUNT N/A 01/17/2019   Procedure: LAPAROSCOPIC REVISION VENTRICULAR-PERITONEAL (V-P) SHUNT;  Surgeon: Donnie Mesa, MD;  Location: [facility];  Service: General;  Laterality: N/A;  . LOOP RECORDER INSERTION N/A 04/10/2017   Procedure: Loop Recorder Insertion;  Surgeon: Thompson Grayer, MD;  Location: [facility];  Service: Cardiovascular;  Laterality: N/A;  .  SHUNT REMOVAL Right 03/13/2016   Procedure: SHUNT REMOVAL;  Surgeon: Earnie Larsson, MD;  Location: [facility];  Service: Neurosurgery;  Laterality: Right;  . SHUNT REMOVAL Right 08/09/2018   Procedure: SHUNT REMOVAL With Placement of Ventricular Catheter;  Surgeon: Consuella Lose, MD;  Location: [facility] [place];  Service: Neurosurgery;  Laterality: Right;  . SHUNT REVISION Right 08/05/2018   Procedure: SHUNT REVISION;  Surgeon: Earnie Larsson, MD;  Location: [place];  Service: Neurosurgery;  Laterality: Right;  . SHUNT REVISION Left 12/06/2018   Procedure: Shunt Revision - left;  Surgeon: Earnie Larsson, MD;  Location: [facility];  Service: Neurosurgery;  Laterality: Left;  Shunt Revision - left  . SHUNT REVISION N/A 12/09/2018   Procedure: SHUNT REVISION;  Surgeon: Earnie Larsson, MD;  Location: [place] [facility];  Service: Neurosurgery;  Laterality: N/A;  . SHUNT REVISION Left 01/17/2019   Procedure: Shunt Revision - left;  Surgeon: Earnie Larsson, MD;  Location: [facility];  Service: Neurosurgery;  Laterality: Left;  . SHUNT REVISION Left 04/23/2019   Procedure: Left occipital VP shunt revision with placement of Ventriculo-Atrial Shunt;  Surgeon: Earnie Larsson, MD;  Location: [facility];  Service: Neurosurgery;  Laterality: Left;  . SHUNT REVISION VENTRICULAR-PERITONEAL Left 08/26/2018   Procedure: SHUNT REVISION VENTRICULAR-PERITONEAL;  Surgeon: Earnie Larsson, MD;  Location: [facility];  Service: Neurosurgery;  Laterality: Left;  . SHUNT REVISION VENTRICULAR-PERITONEAL Left 09/02/2018   Procedure: Left Occipital VP shunt revision;  Surgeon: Earnie Larsson, MD;  Location: [facility]  OR;  Service: Neurosurgery;  Laterality: Left;  [name] VASECTOMY  10/02/1997  . VENTRICULOPERITONEAL SHUNT Right 12/18/2014   Procedure: Shunt Placment - right occipital VP shunt ;  Surgeon: Charlie Pitter, MD;  Location: [facility];  Service: Neurosurgery;  Laterality: Right;  Shunt Placment - right occipital VP shunt   . VENTRICULOPERITONEAL SHUNT Right 07/22/2018   Procedure: Shunt Placment  right occipital;  Surgeon: Earnie Larsson, MD;  Location: [facility];  Service: Neurosurgery;  Laterality: Right;  . VENTRICULOPERITONEAL SHUNT Left 08/26/2018   Procedure: LEFT SIDED VENTRICULAR-PERITONEAL SHUNT;  Surgeon: Earnie Larsson, MD;  Location: [facility];  Service: Neurosurgery;  Laterality: Left;  [name] VENTRICULOSTOMY Right 08/09/2018   Procedure: VENTRICULOSTOMY;  Surgeon: Consuella Lose, MD;  Location: [facility];  Service: Neurosurgery;  Laterality: Right;    There were no vitals filed for this visit.  Subjective Assessment - 05/28/19 0905    Subjective  Returned from [facility]  Did well with travel.  Felt a little unsure when on the boat but no falls or LOB.  Did buy a zoom ball and used it when on vacation.  Did try the aquatic exercises in the pool without any difficulty.    Patient is accompained by:  Family member    Currently in Pain?  No/denies         [facility] PT Assessment - 05/28/19 0915      Standardized Balance Assessment   Standardized Balance Assessment  Five Times Sit to Stand    Five times sit to stand comments   10 seconds from 17" bench, no UE support. No LOB posteriorly      Functional Gait  Assessment   Gait assessed   Yes    Gait Level Surface  Walks 20 ft in less than 5.5 sec, no assistive devices, good speed, no evidence for imbalance, normal gait pattern, deviates no more than 6 in outside of the 12 in walkway width.    Change in Gait Speed  Able to smoothly change walking speed without loss of balance or gait deviation. Deviate no more than 6 in outside of the 12 in walkway width.    Gait with Horizontal Head Turns  Performs head turns smoothly with no change in gait. Deviates no more than 6 in outside 12 in walkway width    Gait with Vertical Head Turns  Performs head turns with no change in gait. Deviates no more than 6 in outside 12 in walkway width.    Gait and Pivot Turn  Pivot turns safely within 3 sec and stops quickly with no loss of balance.    Step Over Obstacle  Is  able to step over 2 stacked shoe boxes taped together (9 in total height) without changing gait speed. No evidence of imbalance.    Gait with Narrow Base of Support  Is able to ambulate for 10 steps heel to toe with no staggering.    Gait with Eyes Closed  Walks 20 ft, slow speed, abnormal gait pattern, evidence for imbalance, deviates 10-15 in outside 12 in walkway width. Requires more than 9 sec to ambulate 20 ft.    Ambulating Backwards  Walks 20 ft, no assistive devices, good speed, no evidence for imbalance, normal gait    Steps  Alternating feet, no rail.    Total Score  28    FGA comment:  28/30 low falls risk  [facility] Adult PT Treatment/Exercise - 05/28/19 0928      Therapeutic Activites    Therapeutic Activities  Other Therapeutic Activities    Other Therapeutic Activities  easy jogging forwards and backwards on level surface x 4 reps while performing head turns and scanning to L and R, diagonal and up and down with supervision      Lumbar Exercises: Sidelying   Other Sidelying Lumbar Exercises  Closed chain hip ABD with side planks on forearm first with bilat knees bent and then keeping bottom knee bent and top leg straight performing open chain ABD.  Performed 2 sets x 10 reps on each side.  Greater assistance needed on R side to keep hip lifted off mat        Access Code: [email or web address]  URL: [email or web address]  Date: 05/28/2019  Prepared by: Misty Stanley   Exercises Walking Tandem Stance - 4 sets - 2x daily - 5x weekly Side Step with Agility Ladder - 3 sets - 2x daily - 5x weekly Standing Terminal Knee Extension with Resistance - 8 reps - 2 sets - 5 second hold - 2x daily - 5x weekly Isometric Gluteus Medius at Wall - 2 sets - 15-20 hold - 1x daily - 7x weekly Tandem Stance - 4 sets - 10 reps - 2x daily - 5x weekly Modified Side Plank with Hip Abduction - 5 reps - 2 sets - 10 seconds hold - 1x daily - 5x weekly Side Plank (without  feet on step) Legs Straight - 5 reps - 2 sets - 10 seconds hold - 1x daily - 5x weekly Squat on Tilt Board Lateral Pivot - 10 reps - 2 sets - 1x daily - 7x weekly Single Leg Stance on Rocker Board - 4 sets - 10 second hold - 1x daily - 7x weekly Front to Back Standing Balance on Rocker Board with Unilateral Counter Support - 2 sets - 10 seconds hold - 1x daily - 7x weekly     Balance Exercises - 05/28/19 1215      Balance Exercises: Standing   Rockerboard  Anterior/posterior;Lateral;Intermittent UE support;EC;10 reps   Squats, SLS, feet together EC 10 head nods/turns       PT Education - 05/28/19 1100    Education Details  plan for final visits; progress towards goals, updating HEP.  Safety when walking in the dark, especially at night when going to the bathroom multiple times    Person(s) Educated  Patient;Spouse    Methods  Explanation    Comprehension  Verbalized understanding       PT Short Term Goals - 04/06/19 1430      PT SHORT TERM GOAL #1   Title  = LTG        PT Long Term Goals - 05/28/19 6606      PT LONG TERM GOAL #1   Title  Pt will be independent and compliant with performing PROGRESSED HEP to improve balance, strength, and functional mobility.    Time  6    Period  Weeks    Status  On-going      PT LONG TERM GOAL #2   Title  Pt will demonstrate ability to perform safe sit <> stand from low surface as demonstrated in five time sit to stand from <17" surface without use of UE in </= 10 seconds    Time  6    Period  Weeks    Status  Achieved      PT LONG TERM GOAL #3  Title  Pt will demonstrate ability to perform jogging forwards and backwards with head turns x 100' each with supervision in controlled environment to simulate basketball referee activities.    Time  6    Period  Weeks    Status  Achieved      PT LONG TERM GOAL #4   Title  Pt will improve FGA score to >/=28/30 indicating improvement in functional mobility.     Baseline  28/30    Time   6    Period  Weeks    Status  Achieved            Plan - 05/28/19 1219    Clinical Impression Statement  Treatment session focused on assessment of progress towards LTG.  Pt is making good progress and has met all but LTG #1.  Pt demonstrates improved balance with narrow BOS but continues to demonstrate increased veering with eyes closed indicating continued falls risk when he is less able to use vision (dark environments); continued to discuss safety with ambulating in low light conditions.  Continued to review and update HEP focusing on dynamic balance balance, weight shifting and balance reactions on unstable surface and with eyes closed.  Will finalize HEP and plan to D/C at next visit.    Rehab Potential  Good    PT Frequency  1x / week    PT Duration  6 weeks    PT Treatment/Interventions  ADLs/Self Care Home Management;Therapeutic activities;Therapeutic exercise;Balance training;Neuromuscular re-education;Patient/family education;Stair training;Gait training;Functional mobility training;Cognitive remediation;Aquatic Therapy    PT Next Visit Plan  Finalize HEP and D/C    PT Home Exercise Plan  [email or web address]     Consulted and Agree with Plan of Care  Patient;Family member/caregiver    Family Member Consulted  wife       Patient will benefit from skilled therapeutic intervention in order to improve the following deficits and impairments:  Abnormal gait, Decreased safety awareness, Decreased balance, Decreased cognition, Decreased strength, Difficulty walking  Visit Diagnosis: 1. Other symptoms and signs involving the nervous system   2. Unsteadiness on feet   3. Other abnormalities of gait and mobility        Problem List Patient Active Problem List   Diagnosis Date Noted  . Visual disturbance   . Slow transit constipation   . Hypoalbuminemia due to protein-calorie malnutrition ([place])   . Acute blood loss anemia   . S/P VP shunt   . Hydrocephalus ([place]) 08/30/2018  .  Dyslipidemia   . History of CVA (cerebrovascular accident)   . Benign essential HTN   . Tachycardia   . Leukocytosis   . Hyponatremia   . Hypokalemia   . 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 Infection of ventricular shunt ([place]) 08/09/2018  . Bacterial meningitis 08/09/2018  . TIA (transient ischemic attack) 01/29/2017  . Acute encephalopathy   . Shunt malfunction 03/13/2016  . 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 Communicating hydrocephalus ([place]) 12/18/2014  . Hyperlipidemia 10/20/2014  . Degenerative disc disease, lumbar 04/15/2013  . Routine general medical examination at a health care facility 07/23/2012  . DISTURBANCE OF SKIN SENSATION 10/05/2008  . HYPERLIPIDEMIA 01/01/2008  . MYCOPLASMA PNEUMONIA 01/01/2008    Rico Junker, PT, DPT 05/28/19    12:25 PM    [facility] [address] [place], [place], [address] Phone: [phone]   Fax:  [phone]  Name: Charles Marquez MRN: [id number] Date of Birth: Dec 13, 1955

## 2019-06-13 ENCOUNTER — Encounter: Payer: Self-pay | Admitting: Physical Therapy

## 2019-06-13 ENCOUNTER — Ambulatory Visit: Payer: BC Managed Care – PPO | Admitting: Physical Therapy

## 2019-06-13 ENCOUNTER — Other Ambulatory Visit: Payer: Self-pay

## 2019-06-13 DIAGNOSIS — R29818 Other symptoms and signs involving the nervous system: Secondary | ICD-10-CM

## 2019-06-13 DIAGNOSIS — R2681 Unsteadiness on feet: Secondary | ICD-10-CM

## 2019-06-13 DIAGNOSIS — R2689 Other abnormalities of gait and mobility: Secondary | ICD-10-CM

## 2019-06-13 NOTE — Patient Instructions (Signed)
Access Code: [email or web address]  URL: [email or web address]  Date: 06/13/2019  Prepared by: Misty Stanley   Exercises Walking Tandem Stance - 4 sets - 2x daily - 5x weekly Standing Terminal Knee Extension with Resistance - 8 reps - 2 sets - 5 second hold - 2x daily - 5x weekly Isometric Gluteus Medius at Wall - 2 sets - 15-20 hold - 1x daily - 7x weekly Modified Side Plank with Hip Abduction - 5 reps - 2 sets - 10 seconds hold - 1x daily - 5x weekly Side Plank (without feet on step) Legs Straight - 5 reps - 2 sets - 10 seconds hold - 1x daily - 5x weekly Squat on Tilt Board Lateral Pivot - 10 reps - 2 sets - 1x daily - 7x weekly Single Leg Stance on Rocker Board - 4 sets - 10 second hold - 1x daily - 7x weekly Front to Back Standing Balance on Rocker Board with Unilateral Counter Support - 2 sets - 10 seconds hold - 1x daily - 7x weekly Forward and Backward Walking with Eyes Closed and Counter Support - 10 reps - 4 sets - 1x daily - 7x weekly Lateral Shuffles - 10 reps - 4 sets - 1x daily - 7x weekly Swiss Ball Sit Up - 10 reps - 2 sets - 1x daily - 7x weekly Skip with High Knees - 10 reps - 4 sets - 1x daily - 7x weekly

## 2019-06-13 NOTE — Therapy (Signed)
[facility] [address] [place], [place], [address] Phone: [phone]   Fax:  [phone]  Physical Therapy Treatment and D/C Summary  Patient Details  Name: Charles Marquez MRN: [id number] Date of Birth: [birth year] Referring Provider (PT): Cooper Render. Pool, MD   Encounter Date: 06/13/2019   CLINIC OPERATION CHANGES: Outpatient Neuro Rehab is open at lower capacity following universal masking, social distancing, and patient screening.  The patient's COVID risk of complications score is 3.   PT End of Session - 06/13/19 0855    Visit Number  7    Number of Visits  7    Date for PT Re-Evaluation  06/11/19   extended due to missed visits   Authorization Type  BCBS OTHER     Authorization Time Period  PT/OT/ST/Hydro therapy combined: 90 VL    PT Start Time  0800    PT Stop Time  0845    PT Time Calculation (min)  45 min    Activity Tolerance  Patient tolerated treatment well    Behavior During Therapy  WFL for tasks assessed/performed       Past Medical History:  Diagnosis Date  . Anxiety   . Hypercholesteremia   . Stroke ([place])    tia's  . TIA (transient ischemic attack)    09.15    Past Surgical History:  Procedure Laterality Date  . Fractured arm Left 12  . HERNIA REPAIR Right 3/12  . LAPAROSCOPIC REVISION VENTRICULAR-PERITONEAL (V-P) SHUNT N/A 08/26/2018   Procedure: LAPAROSCOPIC INSERTION VENTRICULAR-PERITONEAL (V-P) SHUNT;  Surgeon: Earnie Larsson, MD;  Location: [place];  Service: Neurosurgery;  Laterality: N/A;  . LAPAROSCOPIC REVISION VENTRICULAR-PERITONEAL (V-P) SHUNT N/A 01/17/2019   Procedure: LAPAROSCOPIC REVISION VENTRICULAR-PERITONEAL (V-P) SHUNT;  Surgeon: Donnie Mesa, MD;  Location: [facility];  Service: General;  Laterality: N/A;  . LOOP RECORDER INSERTION N/A 04/10/2017   Procedure: Loop Recorder Insertion;  Surgeon: Thompson Grayer, MD;  Location: [facility] [facility];  Service: Cardiovascular;   Laterality: N/A;  . SHUNT REMOVAL Right 03/13/2016   Procedure: SHUNT REMOVAL;  Surgeon: Earnie Larsson, MD;  [facility];  Service: Neurosurgery;  Laterality: Right;  . SHUNT REMOVAL Right 08/09/2018   Procedure: SHUNT REMOVAL With Placement of Ventricular Catheter;  Surgeon: Consuella Lose, MD;  Location: [facility];  Service: Neurosurgery;  Laterality: Right;  . SHUNT REVISION Right 08/05/2018   Procedure: SHUNT REVISION;  Surgeon: Earnie Larsson, MD;  Location: [facility];  Service: Neurosurgery;  Laterality: Right;  . SHUNT REVISION Left 12/06/2018   Procedure: Shunt Revision - left;  Surgeon: Earnie Larsson, MD;  Location: [facility];  Service: Neurosurgery;  Laterality: Left;  Shunt Revision - left  . SHUNT REVISION N/A 12/09/2018   Procedure: SHUNT REVISION;  Surgeon: Earnie Larsson, MD;  Location: [facility];  Service: Neurosurgery;  Laterality: N/A;  . SHUNT REVISION Left 01/17/2019   Procedure: Shunt Revision - left;  Surgeon: Earnie Larsson, MD;  Location: [facility];  Service: Neurosurgery;  Laterality: Left;  . SHUNT REVISION Left 04/23/2019   Procedure: Left occipital VP shunt revision with placement of Ventriculo-Atrial Shunt;  Surgeon: Earnie Larsson, MD;  Location: [facility];  Service: Neurosurgery;  Laterality: Left;  . SHUNT REVISION VENTRICULAR-PERITONEAL Left 08/26/2018   Procedure: SHUNT REVISION VENTRICULAR-PERITONEAL;  Surgeon: Earnie Larsson, MD;  Location: [place];  Service: Neurosurgery;  Laterality: Left;  . SHUNT REVISION VENTRICULAR-PERITONEAL Left 09/02/2018   Procedure: Left Occipital VP shunt revision;  Surgeon: Earnie Larsson, MD;  Location: [facility] [facility];  Service: Neurosurgery;  Laterality: Left;  [name] VASECTOMY  10/02/1997  . VENTRICULOPERITONEAL SHUNT Right 12/18/2014   Procedure: Shunt Placment - right occipital VP shunt ;  Surgeon: Charlie Pitter, MD;  Location: [facility];  Service: Neurosurgery;  Laterality: Right;  Shunt Placment - right occipital VP shunt   . VENTRICULOPERITONEAL SHUNT Right 07/22/2018    Procedure: Shunt Placment right occipital;  Surgeon: Earnie Larsson, MD;  Location: [facility];  Service: Neurosurgery;  Laterality: Right;  . VENTRICULOPERITONEAL SHUNT Left 08/26/2018   Procedure: LEFT SIDED VENTRICULAR-PERITONEAL SHUNT;  Surgeon: Earnie Larsson, MD;  Location: [employer] [facility];  Service: Neurosurgery;  Laterality: Left;  [name] VENTRICULOSTOMY Right 08/09/2018   Procedure: VENTRICULOSTOMY;  Surgeon: Consuella Lose, MD;  Location: [facility];  Service: Neurosurgery;  Laterality: Right;    There were no vitals filed for this visit.  Subjective Assessment - 06/13/19 0804    Subjective  No changes to report.  Goes to have a cognitive assessment to continue application for disability.  SLP has not received request for her cognitive assessment from SS.    Patient is accompained by:  Family member    Currently in Pain?  No/denies      Reviewed and performed the following exercises for final HEP.  Pt return demonstrated each exercise safely.  Access Code: [email or web address]  URL: [email or web address]  Date: 06/13/2019  Prepared by: Misty Stanley   Exercises Walking Tandem Stance - 4 sets - 2x daily - 5x weekly Standing Terminal Knee Extension with Resistance - 8 reps - 2 sets - 5 second hold - 2x daily - 5x weekly Isometric Gluteus Medius at Wall - 2 sets - 15-20 hold - 1x daily - 7x weekly Modified Side Plank with Hip Abduction - 5 reps - 2 sets - 10 seconds hold - 1x daily - 5x weekly Side Plank (without feet on step) Legs Straight - 5 reps - 2 sets - 10 seconds hold - 1x daily - 5x weekly Squat on Tilt Board Lateral Pivot - 10 reps - 2 sets - 1x daily - 7x weekly Single Leg Stance on Rocker Board - 4 sets - 10 second hold - 1x daily - 7x weekly Front to Back Standing Balance on Rocker Board with Unilateral Counter Support - 2 sets - 10 seconds hold - 1x daily - 7x weekly Forward and Backward Walking with Eyes Closed and Counter Support - 10 reps - 4 sets - 1x daily - 7x weekly Lateral Shuffles  - 10 reps - 4 sets - 1x daily - 7x weekly Swiss Ball Sit Up - 10 reps - 2 sets - 1x daily - 7x weekly Skip with High Knees - 10 reps - 4 sets - 1x daily - 7x weekly      PT Education - 06/13/19 0850    Education Details  Final HEP recommendations    Person(s) Educated  Patient;Spouse    Methods  Explanation;Demonstration;Handout    Comprehension  Verbalized understanding;Returned demonstration       PT Short Term Goals - 04/06/19 1430      PT SHORT TERM GOAL #1   Title  = LTG        PT Long Term Goals - 06/13/19 0855      PT LONG TERM GOAL #1   Title  Pt will be independent and compliant with performing PROGRESSED HEP to improve balance, strength, and functional mobility.    Time  6    Period  Weeks  Status  Achieved      PT LONG TERM GOAL #2   Title  Pt will demonstrate ability to perform safe sit <> stand from low surface as demonstrated in five time sit to stand from <17" surface without use of UE in </= 10 seconds    Time  6    Period  Weeks    Status  Achieved      PT LONG TERM GOAL #3   Title  Pt will demonstrate ability to perform jogging forwards and backwards with head turns x 100' each with supervision in controlled environment to simulate basketball referee activities.    Time  6    Period  Weeks    Status  Achieved      PT LONG TERM GOAL #4   Title  Pt will improve FGA score to >/=28/30 indicating improvement in functional mobility.     Baseline  28/30    Time  6    Period  Weeks    Status  Achieved            Plan - 06/13/19 0856    Clinical Impression Statement  Treatment session focused on review and revision/upgrade of final HEP to complete final LTG.  Pt has made excellent progress and has met all 4 LTG and demonstrated independence and safe performance of higher level balance, LE and core strengthening HEP.  Discussed with pt and wife ways to incorporate swiss ball, rockerboard and pool into ongoing activity and wellness.  Pt is ready for  D/C today.    Rehab Potential  Good    PT Frequency  1x / week    PT Duration  6 weeks    PT Treatment/Interventions  ADLs/Self Care Home Management;Therapeutic activities;Therapeutic exercise;Balance training;Neuromuscular re-education;Patient/family education;Stair training;Gait training;Functional mobility training;Cognitive remediation;Aquatic Therapy    PT Next Visit Plan  Finalize HEP and D/C    PT Home Exercise Plan  [email or web address]     Consulted and Agree with Plan of Care  Patient;Family member/caregiver    Family Member Consulted  wife       Patient will benefit from skilled therapeutic intervention in order to improve the following deficits and impairments:  Abnormal gait, Decreased safety awareness, Decreased balance, Decreased cognition, Decreased strength, Difficulty walking  Visit Diagnosis: 1. Other symptoms and signs involving the nervous system   2. Unsteadiness on feet   3. Other abnormalities of gait and mobility        Problem List Patient Active Problem List   Diagnosis Date Noted  . Visual disturbance   . Slow transit constipation   . Hypoalbuminemia due to protein-calorie malnutrition ([place])   . Acute blood loss anemia   . S/P VP shunt   . Hydrocephalus ([place]) 08/30/2018  . Dyslipidemia   . History of CVA (cerebrovascular accident)   . Benign essential HTN   . Tachycardia   . Leukocytosis   . Hyponatremia   . Hypokalemia   . 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 Bacterial meningitis 08/09/2018  . TIA (transient ischemic attack) 01/29/2017  . Acute encephalopathy   . Shunt malfunction 03/13/2016  . 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 Hyperlipidemia 10/20/2014  . Degenerative disc disease, lumbar 04/15/2013  . Routine general medical examination at a health care facility 07/23/2012  . DISTURBANCE OF SKIN SENSATION 10/05/2008  .  HYPERLIPIDEMIA 01/01/2008  . MYCOPLASMA PNEUMONIA 01/01/2008   PHYSICAL THERAPY DISCHARGE  SUMMARY  Visits from Start of Care: 7  Current functional level related to goals / functional outcomes: See LTG achievement and impression statement above   Remaining deficits: Mild balance impairments   Education / Equipment: HEP  Plan: Patient agrees to discharge.  Patient goals were met. Patient is being discharged due to meeting the stated rehab goals.  ?????      Rico Junker, PT, DPT 06/13/19    9:06 AM    [facility] [address] [place], [place], [address] Phone: [phone]   Fax:  [phone]  Name: Domonique Brouillard MRN: [id number] Date of Birth: [birth year]

## 2019-06-30 ENCOUNTER — Encounter: Payer: BLUE CROSS/BLUE SHIELD | Admitting: Internal Medicine

## 2019-08-10 IMAGING — CT CT HEAD W/O CM
3 series · 14 of 47 positions shown, 16 images · non-contrast
Comparison: 08/13/2018 CT head.

CLINICAL DATA: 62 y/o  M; ventriculitis for follow-up.

EXAM:
CT HEAD WITHOUT CONTRAST
TECHNIQUE: Contiguous axial images were obtained from the base of the skull
through the vertex without intravenous contrast.

[Series 3: head 5.0 h30s · axial · 0.43mm/px · z∈[-36,+89]mm · 8 of 31 slices shown, 10 images]
[im 3/31  brain]
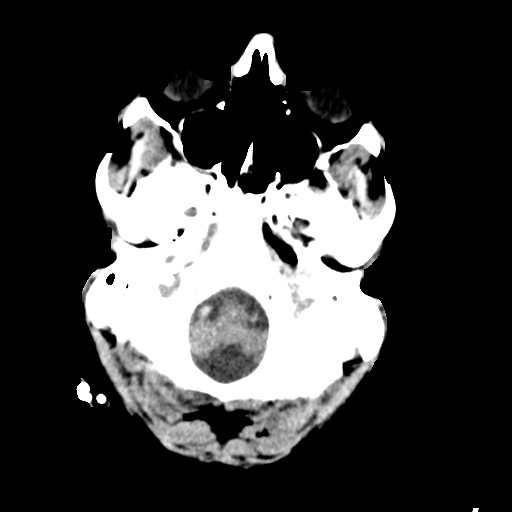
[im 3/31  bone]
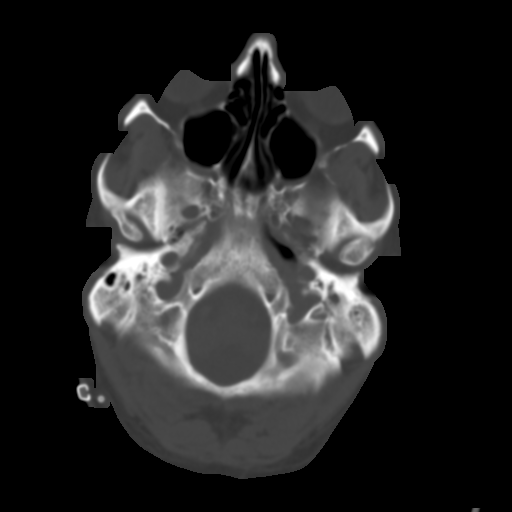
[im 7/31  brain]
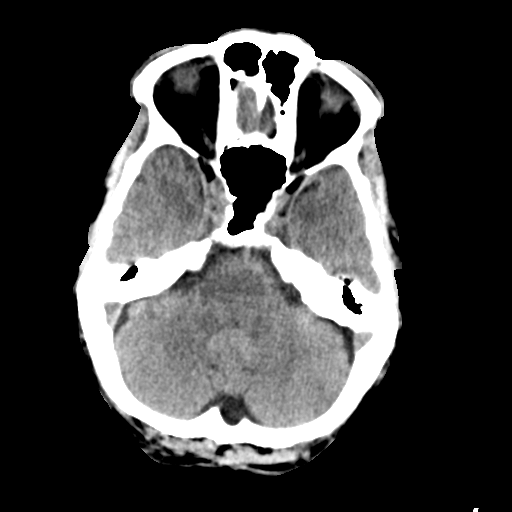
[im 10/31  brain]
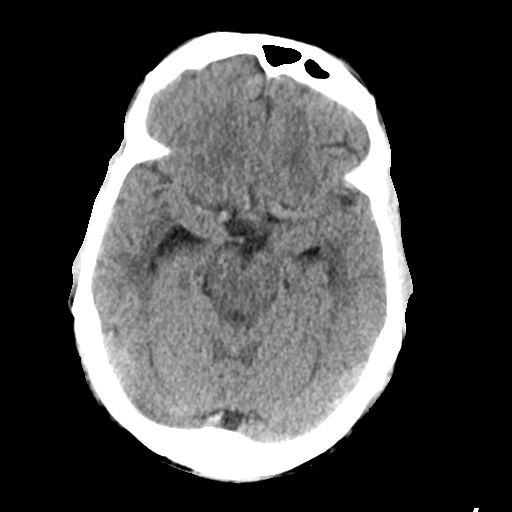
[im 14/31  brain]
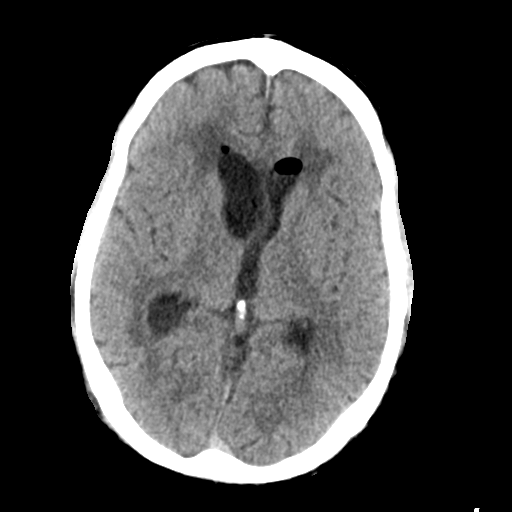
[im 17/31  brain]
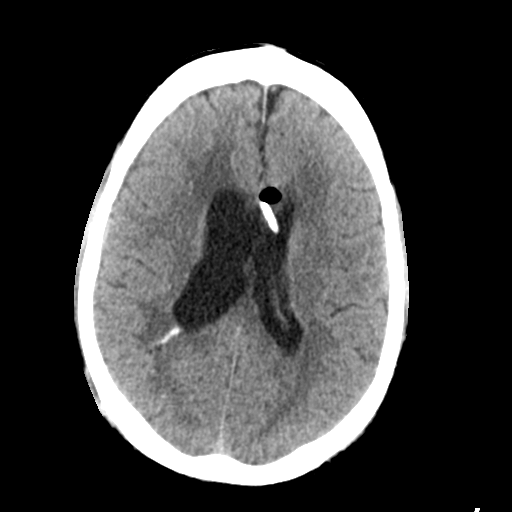
[im 17/31  bone]
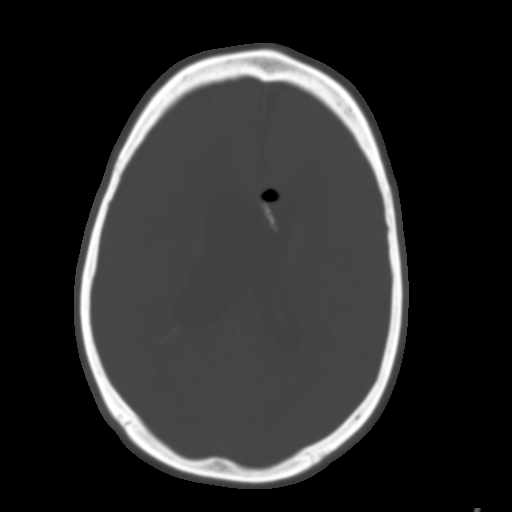
[im 21/31  brain]
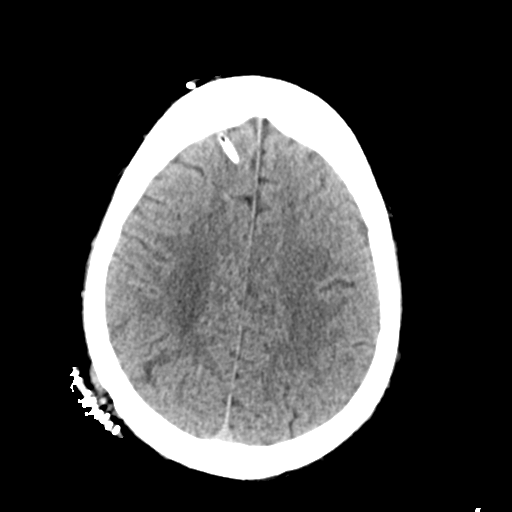
[im 24/31  brain]
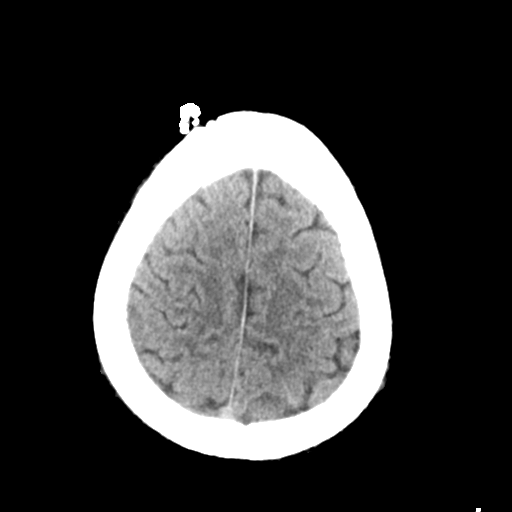
[im 28/31  brain]
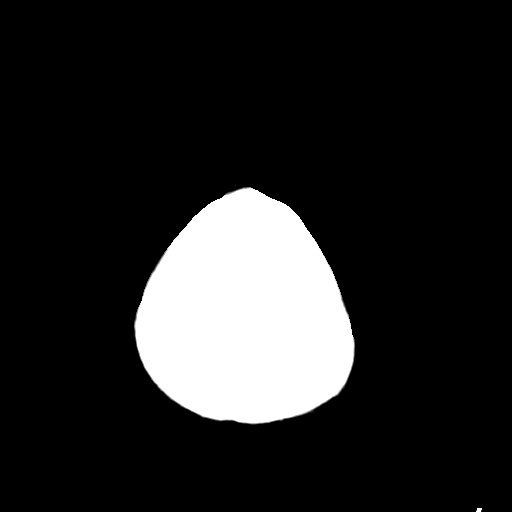

[Series 5: head 3.0 mpr cor · coronal · 0.30mm/px · 3 of 68 slices shown]
[im 23/68  brain]
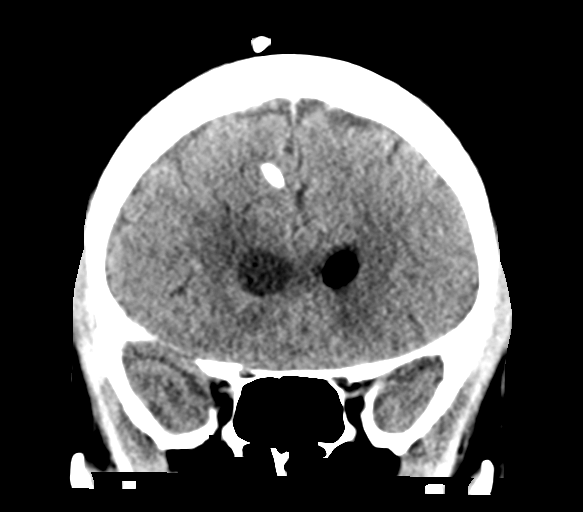
[im 30/68  brain]
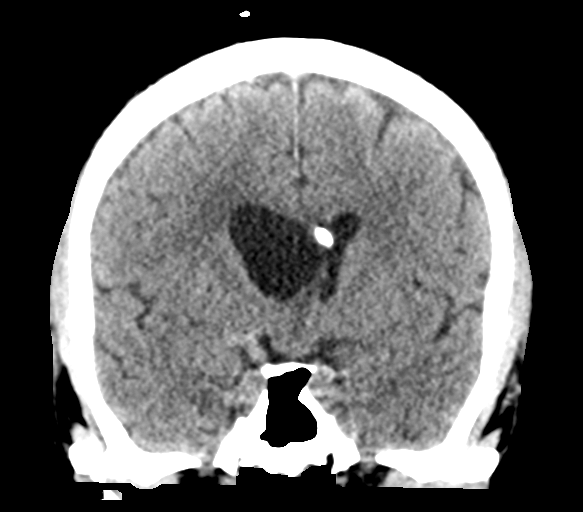
[im 38/68  brain]
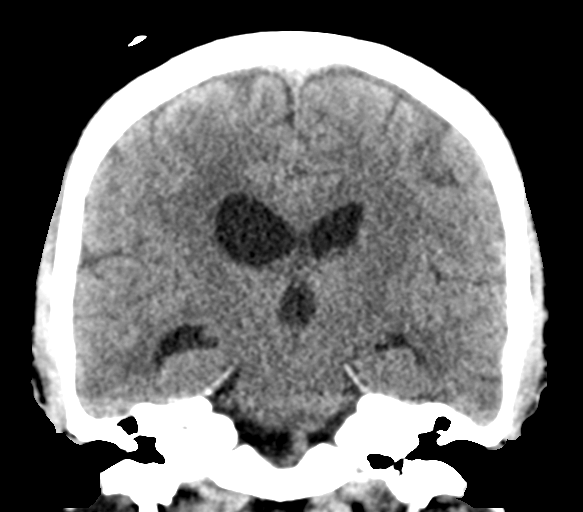

[Series 6: head 3.0 mpr sag · sagittal · 0.30mm/px · 3 of 54 slices shown]
[im 18/54  brain]
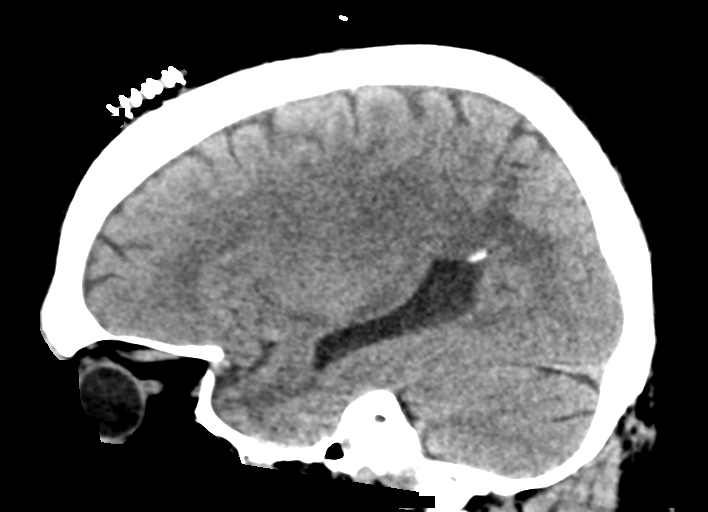
[im 27/54  brain]
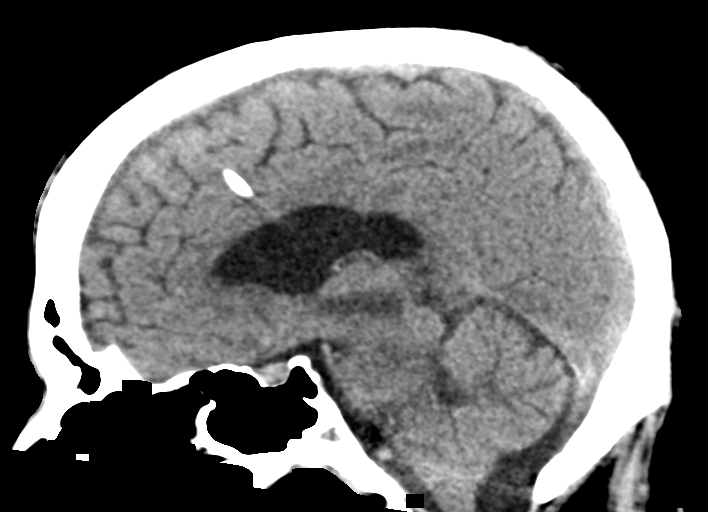
[im 36/54  brain]
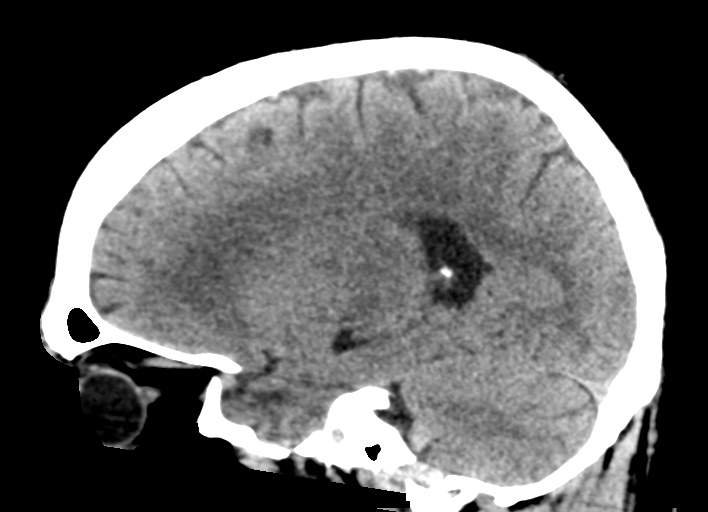

[14 of 47 positions shown; findings below may reference images not displayed]

FINDINGS: Brain: Right frontal approach ventriculostomy catheter with tip in
the left lateral ventricle near the foramen of [place] is stable in
position. Stable small volume of air within the ventricular system
with interval redistribution. Stable small focus of hemorrhage along
the prior right parietal ventriculostomy catheter track. Faintly
increased density within the dependent third ventricle, lateral
ventricles, and the fourth ventricle is stable probably reflecting
debris and/or blood products. Stable mild enlargement of the third
ventricle in the right lateral ventricle. Stable asymmetric decrease
size of the left lateral ventricle. No new acute intracranial
hemorrhage, stroke, focal mass effect, or herniation.

Vascular: No hyperdense vessel or unexpected calcification.

Skull: Normal. Negative for fracture or focal lesion.

Sinuses/Orbits: No acute finding.

Other: None.
IMPRESSION: 1. Stable position of right frontal approach ventriculostomy
catheter.
2. Stable small focus of hemorrhage along the prior right parietal
ventriculostomy catheter track.
3. Stable mild enlargement of the third ventricle in the right
lateral ventricle. Stable partial decompression of left lateral
ventricle.
4. Stable mild increased density of the dependent ventricular system
probably reflecting underlying debris.
5. No new acute intracranial abnormality.

By: Malihuddin Tampubolon M.D.

## 2019-09-01 ENCOUNTER — Ambulatory Visit (INDEPENDENT_AMBULATORY_CARE_PROVIDER_SITE_OTHER): Payer: BC Managed Care – PPO | Admitting: Cardiology

## 2019-09-01 ENCOUNTER — Encounter: Payer: Self-pay | Admitting: Cardiology

## 2019-09-01 ENCOUNTER — Other Ambulatory Visit: Payer: Self-pay

## 2019-09-01 VITALS — BP 118/78 | HR 70 | Ht 70.98 in | Wt 213.0 lb

## 2019-09-01 DIAGNOSIS — I639 Cerebral infarction, unspecified: Secondary | ICD-10-CM

## 2019-09-01 LAB — CUP PACEART INCLINIC DEVICE CHECK
Date Time Interrogation Session: 20201019125257
Implantable Pulse Generator Implant Date: 20180529

## 2019-09-01 NOTE — Progress Notes (Signed)
Electrophysiology Office Note   Date:  09/01/2019   ID:  Charles Marquez, DOB 1956/04/02, MRN [id number]  PCP:  Eartha Inch, MD  Cardiologist:   Primary Electrophysiologist: Sondra Come, MD    Chief Complaint:    History of Present Illness: Charles Marquez is a 62 y.o. male who is being seen today for the evaluation of CVA at the request of Eartha Inch, MD. Presenting today for electrophysiology evaluation.  He has a history of hyperlipidemia, CVA, and anxiety.  He has a Linq monitor in place.  Today, he denies symptoms of palpitations, chest pain, shortness of breath, orthopnea, PND, lower extremity edema, claudication, dizziness, presyncope, syncope, bleeding, or neurologic sequela. The patient is tolerating medications without difficulties.    Past Medical History:  Diagnosis Date  . Anxiety   . Hypercholesteremia   . Stroke ([facility])    tia's  . TIA (transient ischemic attack)    09.15   Past Surgical History:  Procedure Laterality Date  . Fractured arm Left 12  . HERNIA REPAIR Right 3/12  . LAPAROSCOPIC REVISION VENTRICULAR-PERITONEAL (V-P) SHUNT N/A 08/26/2018   Procedure: LAPAROSCOPIC INSERTION VENTRICULAR-PERITONEAL (V-P) SHUNT;  Surgeon: Julio Sicks, MD;  [facility];  Service: Neurosurgery;  Laterality: N/A;  . LAPAROSCOPIC REVISION VENTRICULAR-PERITONEAL (V-P) SHUNT N/A 01/17/2019   Procedure: LAPAROSCOPIC REVISION VENTRICULAR-PERITONEAL (V-P) SHUNT;  Surgeon: Manus Rudd, MD;  [facility];  Service: General;  Laterality: N/A;  . LOOP RECORDER INSERTION N/A 04/10/2017   Procedure: Loop Recorder Insertion;  Surgeon: Hillis Range, MD;  [facility];  Service: Cardiovascular;  Laterality: N/A;  . SHUNT REMOVAL Right 03/13/2016   Procedure: SHUNT REMOVAL;  Surgeon: Julio Sicks, MD;  [facility];  Service: Neurosurgery;  Laterality: Right;  . SHUNT REMOVAL Right 08/09/2018   Procedure: SHUNT REMOVAL With  Placement of Ventricular Catheter;  Surgeon: Lisbeth Renshaw, MD;  Location: [facility];  Service: Neurosurgery;  Laterality: Right;  . SHUNT REVISION Right 08/05/2018   Procedure: SHUNT REVISION;  Surgeon: Julio Sicks, MD;  Location: [facility];  Service: Neurosurgery;  Laterality: Right;  . SHUNT REVISION Left 12/06/2018   Procedure: Shunt Revision - left;  Surgeon: Julio Sicks, MD;  Location[facility];  Service: Neurosurgery;  Laterality: Left;  Shunt Revision - left  . SHUNT REVISION N/A 12/09/2018   Procedure: SHUNT REVISION;  Surgeon: Julio Sicks, MD;  Location: [facility];  Service: Neurosurgery;  Laterality: N/A;  . SHUNT REVISION Left 01/17/2019   Procedure: Shunt Revision - left;  Surgeon: Julio Sicks, MD;  Location: [facility];  Service: Neurosurgery;  Laterality: Left;  . SHUNT REVISION Left 04/23/2019   Procedure: Left occipital VP shunt revision with placement of Ventriculo-Atrial Shunt;  Surgeon: Julio Sicks, MD;  Location: [facility];  Service: Neurosurgery;  Laterality: Left;  . SHUNT REVISION VENTRICULAR-PERITONEAL Left 08/26/2018   Procedure: SHUNT REVISION VENTRICULAR-PERITONEAL;  Surgeon: Julio Sicks, MD;  Location: [facility] OR;  Service: Neurosurgery;  Laterality: Left;  . SHUNT REVISION VENTRICULAR-PERITONEAL Left 09/02/2018   Procedure: Left Occipital VP shunt revision;  Surgeon: Julio Sicks, MD;  Location: [facility] OR;  Service: Neurosurgery;  Laterality: Left;  [name] VASECTOMY  10/02/1997  . 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 VENTRICULOPERITONEAL SHUNT Right 07/22/2018  Procedure: Shunt Placment right occipital;  Surgeon: Julio SicksPool, Henry, MD;  [facility];  Service: Neurosurgery;  Laterality: Right;  . VENTRICULOPERITONEAL SHUNT Left 08/26/2018   Procedure: LEFT SIDED VENTRICULAR-PERITONEAL SHUNT;  Surgeon: Julio SicksPool,  Henry, MD;  [facility];  Service: Neurosurgery;  Laterality: Left;  [name]. VENTRICULOSTOMY Right 08/09/2018   Procedure: VENTRICULOSTOMY;  Surgeon: Lisbeth RenshawNundkumar, Neelesh, MD;  [facility];  Service: Neurosurgery;  Laterality: Right;     Current Outpatient Medications  Medication Sig Dispense Refill  . acetaminophen (TYLENOL) 500 MG tablet Take 1,000 mg by mouth every 6 (six) hours as needed for moderate pain.    [name]. atorvastatin (LIPITOR) 40 MG tablet Take 1 tablet (40 mg total) by mouth daily. 90 tablet 3  . Cetirizine HCl (ZYRTEC PO) Take 10 mg by mouth daily as needed (seasonal allergies).     . clopidogrel (PLAVIX) 75 MG tablet Take 75 mg by mouth daily.    . hydroxypropyl methylcellulose / hypromellose (ISOPTO TEARS / GONIOVISC) 2.5 % ophthalmic solution Place 1 drop into both eyes as needed for dry eyes.    . Multiple Vitamins-Minerals (PRESERVISION AREDS 2) CAPS Take 1 capsule by mouth 2 (two) times daily.     No current facility-administered medications for this visit.     Allergies:   Patient has no known allergies.   Social History:  The patient  reports that he has never smoked. He has never used smokeless tobacco. He reports current alcohol use. He reports that he does not use drugs.   Family History:  The patient's family history includes Alzheimer's disease in his father; COPD in his mother; Lung cancer in his father.    ROS:  Please see the history of present illness.   Otherwise, review of systems is positive for none.   All other systems are reviewed and negative.    PHYSICAL EXAM: VS:  BP 118/78   Pulse 70   Ht 5' 10.98" (1.803 m)   Wt 213 lb (96.6 kg)   BMI 29.72 kg/m  , BMI Body mass index is 29.72 kg/m. GEN: Well nourished, well developed, in no acute distress  HEENT: normal  Neck: no JVD, carotid bruits, or masses Cardiac: RRR; no murmurs, rubs, or gallops,no edema  Respiratory:  clear to auscultation bilaterally, normal work of breathing GI: soft,  nontender, nondistended, + BS MS: no deformity or atrophy  Skin: warm and dry, device pocket is well healed Neuro:  Strength and sensation are intact Psych: euthymic mood, full affect  EKG:  EKG is ordered today. Personal review of the ekg ordered shows sinus rhythm, PACs, rate 70  Device interrogation is reviewed today in detail.  See PaceArt for details.   Recent Labs: 11/05/2018: ALT 21 04/21/2019: BUN 16; Creatinine, Ser 0.86; Hemoglobin 13.3; Platelets 201; Potassium 4.1; Sodium 139    Lipid Panel     Component Value Date/Time   CHOL 114 01/30/2017 0238   TRIG 131 01/30/2017 0238   HDL 41 01/30/2017 0238   CHOLHDL 2.8 01/30/2017 0238   VLDL 26 01/30/2017 0238   LDLCALC 47 01/30/2017 0238   LDLDIRECT 106.2 07/23/2012 0942     Wt Readings from Last 3 Encounters:  09/01/19 213 lb (96.6 kg)  04/23/19 194 lb 0.1 oz (88 kg)  01/17/19 194 lb (88 kg)      Other studies Reviewed: Additional studies/ records that were reviewed today include: TTE 2018  Review of the above records today demonstrates:   - Left ventricle: The cavity  size was normal. Wall thickness was   normal. Systolic function was normal. The estimated ejection   fraction was in the range of 60% to 65%. Wall motion was normal;   there were no regional wall motion abnormalities. Features are   consistent with a pseudonormal left ventricular filling pattern,   with concomitant abnormal relaxation and increased filling   pressure (grade 2 diastolic dysfunction).   ASSESSMENT AND PLAN:  1.  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  He is not undergoing remote monitoring due to cost considerations.  Linq monitor shows no episodes of atrial fibrillation.  We Charles Marquez continue to monitor.  He Charles Marquez come back in 6 months to device clinic for an interrogation.    Current medicines are reviewed at length with the patient today.   The patient does not have concerns regarding his medicines.  The  following changes were made today:  none  Labs/ tests ordered today include:  Orders Placed This Encounter  Procedures  . EKG 12-Lead     Disposition:   FU with Charles Marquez 1 year  Signed, Charles Anstey Meredith Leeds, MD  09/01/2019 9:31 AM     [facility] [address] [place] [address] [phone] (office) [phone] (fax)

## 2019-09-01 NOTE — Patient Instructions (Addendum)
Medication Instructions:  Your physician recommends that you continue on your current medications as directed. Please refer to the Current Medication list given to you today.  * If you need a refill on your cardiac medications before your next appointment, please call your pharmacy.   Labwork: None ordered   Testing/Procedures: None ordered  Follow-Up: Your physician wants you to follow-up in: 6 months with device clinic. You will receive a reminder letter in the mail two months in advance. If you don't receive a letter, please call our office to schedule the follow-up appointment.  At [facility], you and your health needs are our priority.  As part of our continuing mission to provide you with exceptional heart care, we have created designated Provider Care Teams.  These Care Teams include your primary Cardiologist (physician) and Advanced Practice Providers (APPs -  Physician Assistants and Nurse Practitioners) who all work together to provide you with the care you need, when you need it.  You will need a follow up appointment in 1 year.  Please call our office 2 months in advance to schedule this appointment.  You may see Dr Curt Bears or one of the following Advanced Practice Providers on your designated Care Team:    Chanetta Marshall, NP  Tommye Standard, PA-C  Oda Kilts, [place]  Thank you for choosing [facility]!!   Trinidad Curet, RN [phone]

## 2020-02-17 ENCOUNTER — Ambulatory Visit: Payer: Self-pay | Attending: Internal Medicine

## 2020-02-17 DIAGNOSIS — Z23 Encounter for immunization: Secondary | ICD-10-CM

## 2020-02-17 NOTE — Progress Notes (Signed)
   Covid-19 Vaccination Clinic  Name:  Ozias Dicenzo    MRN: [id number] DOB: [birth year]  02/17/2020  Mr. Estill was observed post Covid-19 immunization for 15 minutes without incident. He was provided with Vaccine Information Sheet and instruction to access the V-Safe system.   Mr. Smola was instructed to call 911 with any severe reactions post vaccine: [name] Difficulty breathing  . Swelling of face and throat  . A fast heartbeat  . A bad rash all over body  . Dizziness and weakness   Immunizations Administered    Name Date Dose VIS Date Route   Pfizer COVID-19 Vaccine 02/17/2020  9:40 AM 0.3 mL 10/24/2019 Intramuscular   Manufacturer: [employer], [employer]   Lot: [id number]   NDC: [address]-9   Pfizer COVID-19 Vaccine 02/17/2020  9:39 AM 0.3 mL 10/24/2019 Intramuscular   Manufacturer: [employer], [employer]   Lot: [id number]   NDC: [address]-2

## 2020-06-10 ENCOUNTER — Other Ambulatory Visit: Payer: Self-pay | Admitting: Neurosurgery

## 2020-06-10 DIAGNOSIS — G910 Communicating hydrocephalus: Secondary | ICD-10-CM

## 2020-06-16 ENCOUNTER — Ambulatory Visit
Admission: RE | Admit: 2020-06-16 | Discharge: 2020-06-16 | Disposition: A | Discharge status: Home / Self Care | Payer: BC Managed Care – PPO | Source: Ambulatory Visit | Attending: Neurosurgery | Admitting: Neurosurgery

## 2020-06-16 DIAGNOSIS — G910 Communicating hydrocephalus: Secondary | ICD-10-CM

## 2021-02-03 ENCOUNTER — Other Ambulatory Visit: Payer: Self-pay | Admitting: Neurosurgery

## 2021-02-03 DIAGNOSIS — G910 Communicating hydrocephalus: Secondary | ICD-10-CM

## 2021-02-08 ENCOUNTER — Ambulatory Visit
Admission: RE | Admit: 2021-02-08 | Discharge: 2021-02-08 | Disposition: A | Discharge status: Home / Self Care | Payer: BC Managed Care – PPO | Source: Ambulatory Visit | Attending: Neurosurgery | Admitting: Neurosurgery

## 2021-02-08 DIAGNOSIS — G910 Communicating hydrocephalus: Secondary | ICD-10-CM

## 2022-02-01 ENCOUNTER — Inpatient Hospital Stay (HOSPITAL_COMMUNITY)
Admission: EM | Admit: 2022-02-01 | Discharge: 2022-02-03 | DRG: 063 | Disposition: A | Discharge status: Home / Self Care | Payer: Medicare HMO | Attending: Neurology | Admitting: Neurology

## 2022-02-01 ENCOUNTER — Encounter (HOSPITAL_COMMUNITY): Payer: Self-pay

## 2022-02-01 ENCOUNTER — Emergency Department (HOSPITAL_COMMUNITY): Payer: Medicare HMO

## 2022-02-01 ENCOUNTER — Inpatient Hospital Stay (HOSPITAL_COMMUNITY): Payer: Medicare HMO

## 2022-02-01 DIAGNOSIS — R479 Unspecified speech disturbances: Secondary | ICD-10-CM | POA: Diagnosis not present

## 2022-02-01 DIAGNOSIS — R4701 Aphasia: Principal | ICD-10-CM

## 2022-02-01 DIAGNOSIS — Z982 Presence of cerebrospinal fluid drainage device: Secondary | ICD-10-CM | POA: Diagnosis not present

## 2022-02-01 DIAGNOSIS — E785 Hyperlipidemia, unspecified: Secondary | ICD-10-CM

## 2022-02-01 DIAGNOSIS — Z7902 Long term (current) use of antithrombotics/antiplatelets: Secondary | ICD-10-CM

## 2022-02-01 DIAGNOSIS — Z8673 Personal history of transient ischemic attack (TIA), and cerebral infarction without residual deficits: Secondary | ICD-10-CM | POA: Diagnosis not present

## 2022-02-01 DIAGNOSIS — R29705 NIHSS score 5: Secondary | ICD-10-CM

## 2022-02-01 DIAGNOSIS — Z79899 Other long term (current) drug therapy: Secondary | ICD-10-CM | POA: Diagnosis not present

## 2022-02-01 DIAGNOSIS — R569 Unspecified convulsions: Secondary | ICD-10-CM | POA: Diagnosis present

## 2022-02-01 DIAGNOSIS — E7800 Pure hypercholesterolemia, unspecified: Secondary | ICD-10-CM | POA: Diagnosis present

## 2022-02-01 DIAGNOSIS — I10 Essential (primary) hypertension: Secondary | ICD-10-CM | POA: Diagnosis present

## 2022-02-01 DIAGNOSIS — I639 Cerebral infarction, unspecified: Secondary | ICD-10-CM | POA: Diagnosis present

## 2022-02-01 DIAGNOSIS — Z6829 Body mass index (BMI) 29.0-29.9, adult: Secondary | ICD-10-CM

## 2022-02-01 DIAGNOSIS — I6389 Other cerebral infarction: Secondary | ICD-10-CM | POA: Diagnosis not present

## 2022-02-01 DIAGNOSIS — G459 Transient cerebral ischemic attack, unspecified: Secondary | ICD-10-CM | POA: Diagnosis present

## 2022-02-01 DIAGNOSIS — E663 Overweight: Secondary | ICD-10-CM | POA: Diagnosis present

## 2022-02-01 DIAGNOSIS — I63412 Cerebral infarction due to embolism of left middle cerebral artery: Secondary | ICD-10-CM | POA: Diagnosis not present

## 2022-02-01 DIAGNOSIS — Z8669 Personal history of other diseases of the nervous system and sense organs: Secondary | ICD-10-CM | POA: Diagnosis not present

## 2022-02-01 DIAGNOSIS — Z7901 Long term (current) use of anticoagulants: Secondary | ICD-10-CM | POA: Diagnosis not present

## 2022-02-01 DIAGNOSIS — E669 Obesity, unspecified: Secondary | ICD-10-CM | POA: Diagnosis present

## 2022-02-01 LAB — DIFFERENTIAL
Abs Immature Granulocytes: 0.02 10*3/uL (ref 0.00–0.07)
Basophils Absolute: 0 10*3/uL (ref 0.0–0.1)
Basophils Relative: 1 %
Eosinophils Absolute: 0.1 10*3/uL (ref 0.0–0.5)
Eosinophils Relative: 1 %
Immature Granulocytes: 0 %
Lymphocytes Relative: 25 %
Lymphs Abs: 1.9 10*3/uL (ref 0.7–4.0)
Monocytes Absolute: 0.9 10*3/uL (ref 0.1–1.0)
Monocytes Relative: 12 %
Neutro Abs: 4.7 10*3/uL (ref 1.7–7.7)
Neutrophils Relative %: 61 %

## 2022-02-01 LAB — COMPREHENSIVE METABOLIC PANEL
ALT: 26 U/L (ref 0–44)
AST: 33 U/L (ref 15–41)
Albumin: 4 g/dL (ref 3.5–5.0)
Alkaline Phosphatase: 50 U/L (ref 38–126)
Anion gap: 9 (ref 5–15)
BUN: 17 mg/dL (ref 8–23)
CO2: 24 mmol/L (ref 22–32)
Calcium: 8.9 mg/dL (ref 8.9–10.3)
Chloride: 105 mmol/L (ref 98–111)
Creatinine, Ser: 1.02 mg/dL (ref 0.61–1.24)
GFR, Estimated: >60 mL/min (ref >60)
Glucose, Bld: 91 mg/dL (ref 70–99)
Potassium: 4.1 mmol/L (ref 3.5–5.1)
Sodium: 138 mmol/L (ref 135–145)
Total Bilirubin: 1.1 mg/dL (ref 0.3–1.2)
Total Protein: 6.7 g/dL (ref 6.5–8.1)

## 2022-02-01 LAB — URINALYSIS, DIPSTICK ONLY
Bilirubin Urine: NEGATIVE
Glucose, UA: NEGATIVE mg/dL
Hgb urine dipstick: NEGATIVE
Ketones, ur: NEGATIVE mg/dL
Leukocytes,Ua: NEGATIVE
Nitrite: NEGATIVE
Protein, ur: NEGATIVE mg/dL
Specific Gravity, Urine: 1.012 (ref 1.005–1.030)
pH: 7 (ref 5.0–8.0)

## 2022-02-01 LAB — RAPID URINE DRUG SCREEN, HOSP PERFORMED
Amphetamines: NOT DETECTED
Barbiturates: NOT DETECTED
Benzodiazepines: NOT DETECTED
Cocaine: NOT DETECTED
Opiates: NOT DETECTED
Tetrahydrocannabinol: NOT DETECTED

## 2022-02-01 LAB — I-STAT CHEM 8, ED
BUN: 20 mg/dL (ref 8–23)
Calcium, Ion: 1.08 mmol/L — ABNORMAL LOW (ref 1.15–1.40)
Chloride: 101 mmol/L (ref 98–111)
Creatinine, Ser: 1 mg/dL (ref 0.61–1.24)
Glucose, Bld: 89 mg/dL (ref 70–99)
HCT: 42 % (ref 39.0–52.0)
Hemoglobin: 14.3 g/dL (ref 13.0–17.0)
Potassium: 4.1 mmol/L (ref 3.5–5.1)
Sodium: 138 mmol/L (ref 135–145)
TCO2: 27 mmol/L (ref 22–32)

## 2022-02-01 LAB — CBC
HCT: 42.9 % (ref 39.0–52.0)
Hemoglobin: 14 g/dL (ref 13.0–17.0)
MCH: 31.3 pg (ref 26.0–34.0)
MCHC: 32.6 g/dL (ref 30.0–36.0)
MCV: 96 fL (ref 80.0–100.0)
Platelets: 153 10*3/uL (ref 150–400)
RBC: 4.47 MIL/uL (ref 4.22–5.81)
RDW: 12.9 % (ref 11.5–15.5)
WBC: 7.7 10*3/uL (ref 4.0–10.5)
nRBC: 0 % (ref 0.0–0.2)

## 2022-02-01 LAB — HIV ANTIBODY (ROUTINE TESTING W REFLEX): HIV Screen 4th Generation wRfx: NONREACTIVE

## 2022-02-01 LAB — TYPE AND SCREEN
ABO/RH(D): A POS
Antibody Screen: NEGATIVE

## 2022-02-01 LAB — ETHANOL: Alcohol, Ethyl (B): <10 mg/dL (ref <10)

## 2022-02-01 LAB — MRSA NEXT GEN BY PCR, NASAL: MRSA by PCR Next Gen: NOT DETECTED

## 2022-02-01 LAB — PROTIME-INR
INR: 1.4 — ABNORMAL HIGH (ref 0.8–1.2)
Prothrombin Time: 16.8 seconds — ABNORMAL HIGH (ref 11.4–15.2)

## 2022-02-01 LAB — PHOSPHORUS: Phosphorus: 3.6 mg/dL (ref 2.5–4.6)

## 2022-02-01 LAB — APTT: aPTT: 27 seconds (ref 24–36)

## 2022-02-01 LAB — TROPONIN I (HIGH SENSITIVITY): Troponin I (High Sensitivity): 11 ng/L (ref <18)

## 2022-02-01 LAB — CBG MONITORING, ED: Glucose-Capillary: 94 mg/dL (ref 70–99)

## 2022-02-01 LAB — MAGNESIUM: Magnesium: 2.1 mg/dL (ref 1.7–2.4)

## 2022-02-01 MED ORDER — ACETAMINOPHEN 325 MG PO TABS
650.0000 mg | ORAL_TABLET | ORAL | Status: DC | PRN
Start: 2022-02-01 — End: 2022-02-03
  Administered 2022-02-01 – 2022-02-02 (×2): 650 mg via ORAL
  Filled 2022-02-01 (×2): qty 2

## 2022-02-01 MED ORDER — IOHEXOL 350 MG/ML SOLN
100.0000 mL | Freq: Once | INTRAVENOUS | Status: AC | PRN
  Administered 2022-02-01: 60 mL via INTRAVENOUS

## 2022-02-01 MED ORDER — LABETALOL HCL 5 MG/ML IV SOLN
20.0000 mg | Freq: Once | INTRAVENOUS | Status: DC
  Filled 2022-02-01: qty 4

## 2022-02-01 MED ORDER — LABETALOL HCL 5 MG/ML IV SOLN
INTRAVENOUS | Status: DC | PRN
  Administered 2022-02-01: 10 mg via INTRAVENOUS

## 2022-02-01 MED ORDER — ASPIRIN 81 MG PO CHEW: 324.0000 mg | CHEWABLE_TABLET | Freq: Once | ORAL | Status: DC

## 2022-02-01 MED ORDER — TENECTEPLASE FOR STROKE
0.2500 mg/kg | PACK | Freq: Once | INTRAVENOUS | Status: AC
  Administered 2022-02-01: 24 mg via INTRAVENOUS

## 2022-02-01 MED ORDER — SODIUM CHLORIDE 0.9% FLUSH
3.0000 mL | Freq: Once | INTRAVENOUS | Status: AC
  Administered 2022-02-01: 3 mL via INTRAVENOUS

## 2022-02-01 MED ORDER — SODIUM CHLORIDE 0.9 % IV SOLN: INTRAVENOUS | Status: DC

## 2022-02-01 MED ORDER — SENNOSIDES-DOCUSATE SODIUM 8.6-50 MG PO TABS
1.0000 | ORAL_TABLET | Freq: Two times a day (BID) | ORAL | Status: DC
  Filled 2022-02-01 (×2): qty 1

## 2022-02-01 MED ORDER — PANTOPRAZOLE SODIUM 40 MG IV SOLR
40.0000 mg | Freq: Every day | INTRAVENOUS | Status: DC
  Administered 2022-02-01: 40 mg via INTRAVENOUS
  Filled 2022-02-01: qty 10

## 2022-02-01 MED ORDER — ACETAMINOPHEN 650 MG RE SUPP: 650.0000 mg | RECTAL | Status: DC | PRN

## 2022-02-01 MED ORDER — ACETAMINOPHEN 160 MG/5ML PO SOLN: 650.0000 mg | ORAL | Status: DC | PRN

## 2022-02-01 MED ORDER — STROKE: EARLY STAGES OF RECOVERY BOOK
Freq: Once | Status: AC
  Filled 2022-02-01: qty 1

## 2022-02-01 MED ORDER — CLEVIDIPINE BUTYRATE 0.5 MG/ML IV EMUL
0.0000 mg/h | INTRAVENOUS | Status: DC
  Administered 2022-02-01: 2 mg/h via INTRAVENOUS
  Filled 2022-02-01: qty 50

## 2022-02-01 NOTE — H&P (Addendum)
Neurology H&P ? ?Charles Marquez ?MR# [id number] ?02/01/2022 ? ? ?CC: aphasia ? ?History is obtained from: EMS and chart. ? ?HPI: Charles Marquez is a 66 y.o. male PMHx as reviewed below, previous history of TIAs hx of shunt placement developed acute onset aphasia. ? ? ?LKW: 1400 ?tNK given: yes ?IR Thrombectomy No, not indicated ?Modified Rankin Scale: 0-Completely asymptomatic and back to baseline post- stroke ?NIHSS: 5 ? ?ROS: A complete ROS was performed and is negative except as noted in the HPI.  ? ?Past Medical History:  ?Diagnosis Date  ? Anxiety   ? Hypercholesteremia   ? Stroke [facility])   ? tia's  ? TIA (transient ischemic attack)   ? 09.15  ? ?Family History  ?Problem Relation Age of Onset  ? COPD Mother   ? Lung cancer Father   ? Alzheimer's disease Father   ? ?Social History:  reports that he has never smoked. He has never used smokeless tobacco. He reports current alcohol use. He reports that he does not use drugs. ? ? ?Prior to Admission medications   ?Medication Sig Start Date End Date Taking? Authorizing Provider  ?acetaminophen (TYLENOL) 500 MG tablet Take 1,000 mg by mouth every 6 (six) hours as needed for moderate pain.    [provider]  ?atorvastatin (LIPITOR) 40 MG tablet Take 1 tablet (40 mg total) by mouth daily. 09/18/18   Angiulli, Lavon Paganini, PA-C  ?Cetirizine HCl (ZYRTEC PO) Take 10 mg by mouth daily as needed (seasonal allergies).     [provider]  ?clopidogrel (PLAVIX) 75 MG tablet Take 75 mg by mouth daily.    [provider]  ?hydroxypropyl methylcellulose / hypromellose (ISOPTO TEARS / GONIOVISC) 2.5 % ophthalmic solution Place 1 drop into both eyes as needed for dry eyes.    [provider]  ?Multiple Vitamins-Minerals (PRESERVISION AREDS 2) CAPS Take 1 capsule by mouth 2 (two) times daily.    [provider]  ? ? ?Exam: ?Current vital signs: ?There were no vitals taken for this visit. ? ?Physical Exam  ?Constitutional: Appears  well-developed and well-nourished.  ?Psych: Affect appropriate to situation ?Eyes: No scleral injection ?HENT: No OP obstruction. ?Head: Normocephalic.  ?Cardiovascular: Normal rate and regular rhythm.  ?Respiratory: Effort normal, symmetric excursions bilaterally, no audible wheezing. ?GI: Soft.  No distension. There is no tenderness.  ?Skin: WDI ? ?Neuro: ?Mental Status: ?Patient is awake, alert, oriented to person. ?Speech impaired fluency, mostly intact comprehension and impaired repetition. ?No signs neglect. ?Visual Fields are full. Pupils are equal, round, and reactive to light. ?EOMI without ptosis or diplopia.  ?Facial sensation is symmetric to temperature ?Facial movement is symmetric.  ?Hearing is intact to voice. ?Uvula midline and palate elevates symmetrically. ?Shoulder shrug is symmetric. ?Tongue is midline without atrophy or fasciculations.  ?Tone is normal. Bulk is normal. 5/5 strength was present in all four extremities. ?Sensation is symmetric to light touch and temperature in the arms and legs. ?Deep Tendon Reflexes: 2+ and symmetric in the biceps and patellae. ?Toes are downgoing bilaterally. ?FNF and HKS are intact bilaterally. ?Gait - Deferred ? ?I have reviewed labs in epic and the pertinent results are: ?CBG 94 ? ?I have reviewed the images obtained: ?NCT head showed no acute ischemic changes, hemorrhage or mass. Shunt in place ?CTA head and neck showed Negative for intracranial large vessel occlusion. No significant stenosis ? ?Assessment: Charles Marquez is a 66 y.o. male PMHx as noted above, TIA (on clopidogrel) VP shunt placement  with acute onset severe global aphasia. His baseline is not clear however his aphasia is severely  debilitating and he was deemed a candidate for tNK. ? ?Impression:  ?Acute embolic stroke ?Severe global aphasia ?NIHSS 5 ?TIA ?HTN ?HLD ?Hx of VP shunt ? ?Plan: ?Patient administered tPA/tNK, with no contraindications to administration.  ? ?Plan: ?Admit to ICU  for 24 hours with frequent neuro checks.  ?Labs: Coags, CBC, type and cross, CMP, Mg, Phos, fasting lipids, HbA1c, troponin, urinalysis. ?Replete electrolytes as needed. ?Post tPA blood pressure: <180/163mm Hg for first 24 hours the gradual reduction over next few days. ?Maintain O2 sats > 94% ?Normothermia - For temperature >37.5C - acetaminophen 650mg  q4-6 hours PRN. ?Repeat CT head in 6 hours if patient has not had MRI brain. ?No antiplatelet medications or anticoagulants for 24 hours following tPA/tNK and after imaging CT/MRI.  ?Blood pressure management per post tPA/tNK-protocol order set.  ?Gentle IV hydration.  ?Relative euglycemia and treat hyperglycemia (>200 mg/dL)/hypoglycemia (< 60mg /dL). ?PT/OT/Speech. ?MRI of brain will need shunt reprogramming. ?TTE.  ?Telemetry monitoring for arrhythmia. ?Bedside swallow screen. ?Stroke education. ?Recommend PT/OT/SLP consult. ?If there is acute neurologic decline STAT CT head.  ? ?PPx: ?    GI - H2 ?    DVT - SCDs for now. ?Precautions: Aspiration/seizure/fall. ? ? ?This patient is critically ill and at significant risk of neurological worsening, death and care requires constant monitoring of vital signs, hemodynamics,respiratory and cardiac monitoring, neurological assessment, discussion with family, other specialists and medical decision making of high complexity. I spent 75 minutes of neurocritical care time  in the care of  this patient. This was time spent independent of any time provided by nurse practitioner or PA. Discussed plan with wife. ? ?Electronically signed by:  ?Lynnae Sandhoff, MD ?Page: [phone] ?02/01/2022, 4:44 PM ? ?If 7pm- 7am, please page neurology on call as listed in [place]. ? ?

## 2022-02-01 NOTE — ED Provider Notes (Signed)
?[facility] EMERGENCY DEPARTMENT ?Provider Note ? ? ?CSN: [id number] ?Arrival date & time: 02/01/22  1639 ? ?An emergency department physician performed an initial assessment on this suspected stroke patient at 19. ? ?History ? ?Chief Complaint  ?Patient presents with  ? Code Stroke  ? ? ?Charles Marquez is a 66 y.o. male presenting by EMS as a code stroke with expressive and receptive aphasia, last known well at 1400 today.  Neurology present on arrival. ? ?HPI ? ?  ? ?Home Medications ?Prior to Admission medications   ?Medication Sig Start Date End Date Taking? Authorizing Provider  ?acetaminophen (TYLENOL) 500 MG tablet Take 1,000 mg by mouth every 6 (six) hours as needed for moderate pain.    [provider]  ?atorvastatin (LIPITOR) 40 MG tablet Take 1 tablet (40 mg total) by mouth daily. 09/18/18   Angiulli, Mcarthur Rossetti, PA-C  ?Cetirizine HCl (ZYRTEC PO) Take 10 mg by mouth daily as needed (seasonal allergies).     [provider]  ?clopidogrel (PLAVIX) 75 MG tablet Take 75 mg by mouth daily.    [provider]  ?hydroxypropyl methylcellulose / hypromellose (ISOPTO TEARS / GONIOVISC) 2.5 % ophthalmic solution Place 1 drop into both eyes as needed for dry eyes.    [provider]  ?Multiple Vitamins-Minerals (PRESERVISION AREDS 2) CAPS Take 1 capsule by mouth 2 (two) times daily.    [provider]  ?   ? ?Allergies    ?Patient has no known allergies.   ? ?Review of Systems   ?Review of Systems ? ?Physical Exam ?Updated Vital Signs ?BP (!) 147/97   Pulse 78   Temp 97.9 ?F (36.6 ?C) (Oral)   Resp 16   Ht 5\' 11"  (1.803 m)   Wt 96.6 kg   SpO2 95%   BMI 29.70 kg/m?  ?Physical Exam ?Constitutional:   ?   General: He is not in acute distress. ?HENT:  ?   Head: Normocephalic and atraumatic.  ?Eyes:  ?   Conjunctiva/sclera: Conjunctivae normal.  ?   Pupils: Pupils are equal, round, and reactive to light.  ?Cardiovascular:  ?   Rate and Rhythm: Normal  rate and regular rhythm.  ?Pulmonary:  ?   Effort: Pulmonary effort is normal. No respiratory distress.  ?Abdominal:  ?   General: There is no distension.  ?   Tenderness: There is no abdominal tenderness.  ?Skin: ?   General: Skin is warm and dry.  ?Neurological:  ?   Mental Status: He is alert.  ?   Comments: Difficulty following commands, expressive and receptive aphasia, moving all extremities.  ? ? ?ED Results / Procedures / Treatments   ?Labs ?(all labs ordered are listed, but only abnormal results are displayed) ?Labs Reviewed  ?PROTIME-INR - Abnormal; Notable for the following components:  ?    Result Value  ? Prothrombin Time 16.8 (*)   ? INR 1.4 (*)   ? All other components within normal limits  ?I-STAT CHEM 8, ED - Abnormal; Notable for the following components:  ? Calcium, Ion 1.08 (*)   ? All other components within normal limits  ?APTT  ?CBC  ?DIFFERENTIAL  ?COMPREHENSIVE METABOLIC PANEL  ?HIV ANTIBODY (ROUTINE TESTING W REFLEX)  ?HEMOGLOBIN A1C  ?LIPID PANEL  ?RAPID URINE DRUG SCREEN, HOSP PERFORMED  ?URINALYSIS, DIPSTICK ONLY  ?ETHANOL  ?MAGNESIUM  ?PHOSPHORUS  ?CBG MONITORING, ED  ?TYPE AND SCREEN  ?TROPONIN I (HIGH SENSITIVITY)  ? ? ?EKG ?EKG Interpretation ? ?Date/Time:  Wednesday February 01 2022 17:08:43 EDT ?Ventricular Rate:  79 ?PR Interval:  151 ?QRS Duration: 93 ?QT Interval:  413 ?QTC Calculation: 474 ?R Axis:   -12 ?Text Interpretation: Sinus rhythm Abnormal R-wave progression, early transition Borderline T wave abnormalities Confirmed by Alvester Chourifan, Aston Lawhorn [phone]([id number]) on 02/01/2022 5:27:27 PM ? ?Radiology ?CT HEAD CODE STROKE WO CONTRAST ? ?Result Date: 02/01/2022 ?CLINICAL DATA:  Code stroke.  Acute neuro deficit.  Aphasia EXAM: CT HEAD WITHOUT CONTRAST TECHNIQUE: Contiguous axial images were obtained from the base of the skull through the vertex without intravenous contrast. RADIATION DOSE REDUCTION: This exam was performed according to the departmental dose-optimization program which includes  automated exposure control, adjustment of the mA and/or kV according to patient size and/or use of iterative reconstruction technique. COMPARISON:  CT head 02/08/2021 FINDINGS: Brain: Left parietal shunt catheter crosses the midline into the right lateral ventricle unchanged. Ventricles are small in size unchanged. Hypodensity in the parietal lobes bilaterally unchanged. Hypodensity right frontal lobe unchanged. Periventricular white matter hypodensity bilaterally appears chronic. Negative for acute infarct, hemorrhage, mass Vascular: Negative for hyperdense vessel Skull: No acute abnormality. Right frontal burr hole. Left parietal and right parietal burr hole. Sinuses/Orbits: Paranasal sinuses clear.  Negative orbit Other: None ASPECTS (Alberta Stroke Program Early CT Score) - Ganglionic level infarction (caudate, lentiform nuclei, internal capsule, insula, M1-M3 cortex): 7 - Supraganglionic infarction (M4-M6 cortex): 3 Total score (0-10 with 10 being normal): 10 IMPRESSION: 1. No acute abnormality no change from prior studies 2. ASPECTS is 10 3. Left parietal shunt catheter unchanged. Ventricular size is small and stable. 4. Code stroke imaging results were communicated on 02/01/2022 at 5:00 pm to provider [facility] via secure text page Electronically Signed   By: Marlan Palauharles  Clark M.D.   On: 02/01/2022 17:01  ? ?CT ANGIO HEAD NECK W WO CM (CODE STROKE) ? ?Result Date: 02/01/2022 ?CLINICAL DATA:  Acute neuro deficit.  Aphasia EXAM: CT ANGIOGRAPHY HEAD AND NECK TECHNIQUE: Multidetector CT imaging of the head and neck was performed using the standard protocol during bolus administration of intravenous contrast. Multiplanar CT image reconstructions and MIPs were obtained to evaluate the vascular anatomy. Carotid stenosis measurements (when applicable) are obtained utilizing NASCET criteria, using the distal internal carotid diameter as the denominator. RADIATION DOSE REDUCTION: This exam was performed according to the  departmental dose-optimization program which includes automated exposure control, adjustment of the mA and/or kV according to patient size and/or use of iterative reconstruction technique. CONTRAST:  60mL OMNIPAQUE IOHEXOL 350 MG/ML SOLN COMPARISON:  CT head 02/01/2021 FINDINGS: CTA NECK FINDINGS Aortic arch: Standard branching. Imaged portion shows no evidence of aneurysm or dissection. No significant stenosis of the major arch vessel origins. Mild atherosclerotic calcification aortic arch Right carotid system: Mild atherosclerotic calcification right carotid bifurcation. Negative for right carotid stenosis Left carotid system: Mild atherosclerotic calcification left carotid bifurcation without stenosis Vertebral arteries: Both vertebral arteries are patent to the skull base without stenosis. Right vertebral dominant. Skeleton: Cervical spondylosis.  No acute abnormality. Other neck: Negative for mass or adenopathy in the neck. Upper chest: Lung apices clear bilaterally. Review of the MIP images confirms the above findings CTA HEAD FINDINGS Anterior circulation: Internal carotid artery widely patent through the skull base and cavernous segments. Anterior and middle cerebral arteries widely patent without stenosis or large vessel occlusion Posterior circulation: Both vertebral arteries are patent to the basilar. PICA patent bilaterally. Basilar widely patent. AICA, superior cerebellar, and posterior cerebral arteries are patent bilaterally. No large vessel occlusion. Fetal  origin left posterior cerebral artery. Venous sinuses: Normal venous enhancement Anatomic variants: None Review of the MIP images confirms the above findings IMPRESSION: 1. Negative for intracranial large vessel occlusion. No significant cranial stenosis 2. No significant carotid or vertebral artery stenosis in the neck. Electronically Signed   By: Marlan Palau M.D.   On: 02/01/2022 17:24   ? ?Procedures ?Marland KitchenCritical Care ?Performed by: Terald Sleeper, MD ?Authorized by: Terald Sleeper, MD  ? ?Critical care provider statement:  ?  Critical care time (minutes):  45 ?  Critical care time was exclusive of:  Separately billable procedures and treating othe

## 2022-02-01 NOTE — ED Triage Notes (Signed)
Pt BIB [facility] for eval as a Code stroke. Pt was on the phone w/ his daughter and daughter noted that speech seemed off. Daughter went to house and found him to be experiencing receptive and expressive aphasia and activated 911. EMS noted a R sided facial droop, some L sided neglect as well as significant aphasia.  ?

## 2022-02-01 NOTE — Progress Notes (Signed)
?   02/01/22 1640  ?Clinical Encounter Type  ?Visited With Patient not available;Family  ?Visit Type Initial;Trauma  ?Referral From Nurse  ?Consult/Referral To Chaplain  ? ? ?Chaplain responded to alert. Patient was on his way to further testing. Spouse arrived and I visited with her for a few minutes and advised her that the incoming chaplain would touch base with her if she wanted to talk and any support.  ? ?Danice Goltz ?Chaplain Resident ?[facility]  ?[phone] ?

## 2022-02-01 NOTE — Code Documentation (Addendum)
Stroke Response Nurse Documentation ?Code Documentation ? ?Charles Marquez is a 66 y.o. male arriving to [facility]  via [facility] on 02/01/22 with past medical hx of TIA,. On No antithrombotic. Code stroke was activated by EMS.  ? ?Patient from home where he was LKW at 1400 and now complaining of aphasia. Daughter who lives close by called him and noticed he wasn't making sense. She went to his house and called 911- patient presented here with aphasia.  ? ?Stroke team at the bedside on patient arrival. Labs drawn and patient cleared for CT by Dr. Langston Masker. Patient to CT with team. NIHSS 5, see documentation for details and code stroke times. Patient with Receptive aphasia  on exam. The following imaging was completed:  CT Head and CTA. Patient is a candidate for IV Thrombolytic and received at 1654. Patient is not not a candidate for IR due to no suspected LVO.  ? ?Care Plan: post TNK vital signs and NIHSS. ? ?Bedside handoff with ED RN Deneise Lever ? ?Meda Klinefelter  ?Stroke Response RN ? ? ?

## 2022-02-01 NOTE — Progress Notes (Signed)
PHARMACIST CODE STROKE RESPONSE ? ?Notified to mix TNK at 1651 by Dr. Theda Sers ?Delivered TNK to RN at 1654 ? ?TNK dose = 24 mg IV over 5 seconds ? ?Issues/delays encountered (if applicable): none ? ?Charles Marquez ?02/01/22 4:55 PM  ?

## 2022-02-02 ENCOUNTER — Inpatient Hospital Stay (HOSPITAL_COMMUNITY): Payer: Medicare HMO

## 2022-02-02 DIAGNOSIS — R479 Unspecified speech disturbances: Secondary | ICD-10-CM

## 2022-02-02 DIAGNOSIS — G459 Transient cerebral ischemic attack, unspecified: Secondary | ICD-10-CM

## 2022-02-02 DIAGNOSIS — Z8669 Personal history of other diseases of the nervous system and sense organs: Secondary | ICD-10-CM

## 2022-02-02 DIAGNOSIS — R569 Unspecified convulsions: Secondary | ICD-10-CM

## 2022-02-02 DIAGNOSIS — I6389 Other cerebral infarction: Secondary | ICD-10-CM

## 2022-02-02 LAB — LIPID PANEL
Cholesterol: 126 mg/dL (ref 0–200)
HDL: 36 mg/dL — ABNORMAL LOW (ref >40)
LDL Cholesterol: 76 mg/dL (ref 0–99)
Total CHOL/HDL Ratio: 3.5 RATIO
Triglycerides: 71 mg/dL (ref <150)
VLDL: 14 mg/dL (ref 0–40)

## 2022-02-02 LAB — ECHOCARDIOGRAM COMPLETE
AR max vel: 3.02 cm2
AV Area VTI: 2.92 cm2
AV Area mean vel: 2.85 cm2
AV Mean grad: 4.5 mmHg
AV Peak grad: 8.8 mmHg
Ao pk vel: 1.49 m/s
Area-P 1/2: 2.54 cm2
Calc EF: 64.7 %
Height: 71 in
P 1/2 time: 818 msec
S' Lateral: 3 cm
Single Plane A2C EF: 59 %
Single Plane A4C EF: 70.1 %
Weight: 3432.12 oz

## 2022-02-02 LAB — HEMOGLOBIN A1C
Hgb A1c MFr Bld: 5.3 % (ref 4.8–5.6)
Mean Plasma Glucose: 105.41 mg/dL

## 2022-02-02 MED ORDER — PANTOPRAZOLE SODIUM 40 MG PO TBEC: 40.0000 mg | DELAYED_RELEASE_TABLET | Freq: Every day | ORAL | Status: DC

## 2022-02-02 MED ORDER — PROSIGHT PO TABS
1.0000 | ORAL_TABLET | Freq: Two times a day (BID) | ORAL | Status: DC
  Administered 2022-02-03: 1 via ORAL
  Filled 2022-02-02 (×3): qty 1

## 2022-02-02 MED ORDER — CHLORHEXIDINE GLUCONATE CLOTH 2 % EX PADS
6.0000 | MEDICATED_PAD | Freq: Every day | CUTANEOUS | Status: DC
  Administered 2022-02-02: 6 via TOPICAL

## 2022-02-02 MED ORDER — CLOPIDOGREL BISULFATE 75 MG PO TABS
75.0000 mg | ORAL_TABLET | Freq: Every evening | ORAL | Status: DC
  Administered 2022-02-02: 75 mg via ORAL
  Filled 2022-02-02: qty 1

## 2022-02-02 MED ORDER — VITAMIN D 25 MCG (1000 UNIT) PO TABS
2000.0000 [IU] | ORAL_TABLET | Freq: Every day | ORAL | Status: DC
  Administered 2022-02-02 – 2022-02-03 (×2): 2000 [IU] via ORAL
  Filled 2022-02-02 (×2): qty 2

## 2022-02-02 MED ORDER — ATORVASTATIN CALCIUM 40 MG PO TABS
40.0000 mg | ORAL_TABLET | Freq: Every evening | ORAL | Status: DC
  Administered 2022-02-02: 40 mg via ORAL
  Filled 2022-02-02: qty 1

## 2022-02-02 NOTE — Progress Notes (Signed)
?  Transition of Care (TOC) Screening Note ? ? ?Patient Details  ?Name: Charles Marquez [facility] ?Date of Birth: Mar 13, 1956 ? ? ?Transition of Care (TOC) CM/SW Contact:    ?Mearl Latin, LCSW ?Phone Number: ?02/02/2022, 10:11 AM ? ? ? ?Transition of Care Department [facility]) has reviewed patient and no TOC needs have been identified at this time. We will continue to monitor patient advancement through interdisciplinary progression rounds. If new patient transition needs arise, please place a TOC consult. ? ? ?

## 2022-02-02 NOTE — Evaluation (Signed)
Physical Therapy Evaluation ?Patient Details ?Name: Charles Marquez [facility] ?MRN: [id number] ?DOB: 01-Nov-1956 ?Today's Date: 02/02/2022 ? ?History of Present Illness ? 66 y.o. male presents to [facility] hospital on 02/01/2022 with acute onset aphasia. CTA negative, pt received IV tNK. MRI negative for acute abnormality. PMH includes TIA, VP shunt, anxiety.  ?Clinical Impression ? Pt presents to PT with deficits in high-level balance, as well as expressive communication. [name] spouse reports chronic deficits in balance involving activities with eyes closed or single leg stance. Pt reports gait and balance does feel altered currently compared to baseline. PT will follow up during this admission to further challenge dynamic gait and balance in an effort to restore the patient's prior level of mobility.   ?   ? ?Recommendations for follow up therapy are one component of a multi-disciplinary discharge planning process, led by the attending physician.  Recommendations may be updated based on patient status, additional functional criteria and insurance authorization. ? ?Follow Up Recommendations No PT follow up ? ?  ?Assistance Recommended at Discharge PRN  ?Patient can return home with the following ? A little help with bathing/dressing/bathroom ? ?  ?Equipment Recommendations None recommended by PT  ?Recommendations for Other Services ?    ?  ?Functional Status Assessment Patient has had a recent decline in their functional status and demonstrates the ability to make significant improvements in function in a reasonable and predictable amount of time.  ? ?  ?Precautions / Restrictions Precautions ?Precautions: Fall ?Precaution Comments: history of VP shunt ?Restrictions ?Weight Bearing Restrictions: No  ? ?  ? ?Mobility ? Bed Mobility ?  ?  ?  ?  ?  ?  ?  ?  ?  ? ?Transfers ?Overall transfer level: Independent ?  ?  ?  ?  ?  ?  ?  ?  ?  ?  ? ?Ambulation/Gait ?Ambulation/Gait assistance: Independent ?Gait Distance (Feet): 400  Feet ?Assistive device: None ?Gait Pattern/deviations: WFL(Within Functional Limits) ?Gait velocity: functional ?Gait velocity interpretation: >2.62 ft/sec, indicative of community ambulatory ?  ?General Gait Details: pt tolerates multiple dynamic gait challenges including change of gait speed and stride length, backward walking, quick turns, head turns, and abrupt stops. ? ?Stairs ?Stairs: Yes ?Stairs assistance: Modified independent (Device/Increase time) ?Stair Management: One rail Left, Alternating pattern ?Number of Stairs: 20 ?  ? ?Wheelchair Mobility ?  ? ?Modified Rankin (Stroke Patients Only) ?  ? ?  ? ?Balance Overall balance assessment: Needs assistance ?  ?  ?  ?  ?Standing balance support: No upper extremity supported ?Standing balance-Leahy Scale: Good ?  ?Single Leg Stance - Right Leg: 3 ?  ?  ?  ?  ?Rhomberg - Eyes Closed: 20 ?  ?High Level Balance Comments: increased sway with eyes closed, difficulty with SLS, spouse reports these tests to be difficult at baseline since VP shunt placement ?  ?  ?  ?   ? ? ? ?Pertinent Vitals/Pain Pain Assessment ?Pain Assessment: No/denies pain  ? ? ?Home Living Family/patient expects to be discharged to:: Private residence ?Living Arrangements: Spouse/significant other ?Available Help at Discharge: Family ?Type of Home: House ?Home Access: Stairs to enter ?Entrance Stairs-Rails: Can reach both ?Entrance Stairs-Number of Steps: 4 ?Alternate Level Stairs-Number of Steps: flight ?Home Layout: Two level;1/2 bath on main level ?Home Equipment: Agricultural consultant (2 wheels);Cane - single point;BSC/3in1;Shower seat ?   ?  ?Prior Function Prior Level of Function : Independent/Modified Independent (does not drive) ?  ?  ?  ?  ?  ?  ?  ?  ?  ? ? ?  Hand Dominance  ? Dominant Hand: Right ? ?  ?Extremity/Trunk Assessment  ? Upper Extremity Assessment ?Upper Extremity Assessment: Overall WFL for tasks assessed ?  ? ?Lower Extremity Assessment ?Lower Extremity Assessment: Overall WFL  for tasks assessed ?  ? ?Cervical / Trunk Assessment ?Cervical / Trunk Assessment: Normal  ?Communication  ? Communication: Expressive difficulties  ?Cognition Arousal/Alertness: Awake/alert ?Behavior During Therapy: [facility] for tasks assessed/performed ?Overall Cognitive Status: Within Functional Limits for tasks assessed ?  ?  ?  ?  ?  ?  ?  ?  ?  ?  ?  ?  ?  ?  ?  ?  ?  ?  ?  ? ?  ?General Comments General comments (skin integrity, edema, etc.): VSS on RA ? ?  ?Exercises    ? ?Assessment/Plan  ?  ?PT Assessment Patient needs continued PT services  ?PT Problem List Decreased balance ? ?   ?  ?PT Treatment Interventions Gait training;Balance training;Neuromuscular re-education;Patient/family education   ? ?PT Goals (Current goals can be found in the Care Plan section)  ?Acute Rehab PT Goals ?Patient Stated Goal: to return home ?PT Goal Formulation: With patient ?Time For Goal Achievement: 02/16/22 ?Potential to Achieve Goals: Good ?Additional Goals ?Additional Goal #1: Pt will score >19/24 on the DGI to indicate a reduced risk for falls ?Additional Goal #2: Pt will score >45/56 on the BERG balance test to indicate a reduced risk for falls ? ?  ?Frequency Min 2X/week ?  ? ? ?Co-evaluation   ?  ?  ?  ?  ? ? ?  ?AM-PAC PT "6 Clicks" Mobility  ?Outcome Measure Help needed turning from your back to your side while in a flat bed without using bedrails?: None ?Help needed moving from lying on your back to sitting on the side of a flat bed without using bedrails?: None ?Help needed moving to and from a bed to a chair (including a wheelchair)?: None ?Help needed standing up from a chair using your arms (e.g., wheelchair or bedside chair)?: None ?Help needed to walk in hospital room?: None ?Help needed climbing 3-5 steps with a railing? : None ?6 Click Score: 24 ? ?  ?End of Session   ?Activity Tolerance: Patient tolerated treatment well ?Patient left: in chair;with call bell/phone within reach;with family/visitor present ?Nurse  Communication: Mobility status ?PT Visit Diagnosis: Other symptoms and signs involving the nervous system (R29.898) ?  ? ?Time: 9233-0076 ?PT Time Calculation (min) (ACUTE ONLY): 12 min ? ? ?Charges:   PT Evaluation ?$PT Eval Low Complexity: 1 Low ?  ?  ?   ? ? ?Arlyss Gandy, PT, DPT ?Acute Rehabilitation ?Pager: [phone] ?Office [phone] ? ? ?Arlyss Gandy ?02/02/2022, 5:13 PM ? ?

## 2022-02-02 NOTE — TOC CAGE-AID Note (Signed)
Transition of Care (TOC) - CAGE-AID Screening ? ? ?Patient Details  ?Name: Charles Marquez [facility] ?MRN: [id number] ?Date of Birth: July 20, 1956 ? ?Transition of Care (TOC) CM/SW Contact:    ?Nivea Wojdyla C Tarpley-Carter, LCSWA ?Phone Number: ?02/02/2022, 2:39 PM ? ? ?Clinical Narrative: ?Pt is unable to participate in Cage Aid. ?Pt was not in room.  CSW will assess at a better time. ? ?Insurance underwriter, MSW, LCSW-A ?Pronouns:  She/Her/Hers ?[facility] HealthTransitions of Care ?Clinical Social Worker ?Direct Number:  [phone] ?[email or web address] ? ?CAGE-AID Screening: ?Substance Abuse Screening unable to be completed due to: : Patient unable to participate ? ?  ?  ?  ?  ?  ? ?Substance Abuse Education Offered: No ? ?  ? ? ? ? ? ? ?

## 2022-02-02 NOTE — Plan of Care (Addendum)
Spoke with nsgy PA for Dr. Jordan Likes, they will come after MRI to check/program shunt. They are in contact with nursing staff.  ? ?Elmer Picker, DNP, FNP-BC ?[facility] ?Pager: [phone] ? ?

## 2022-02-02 NOTE — Progress Notes (Signed)
OT Cancellation Note ? ?Patient Details ?Name: Charles Marquez [facility] ?MRN: [id number] ?DOB: 1955-11-20 ? ? ?Cancelled Treatment:    Reason Eval/Treat Not Completed: Patient at procedure or test/ unavailable (MRI) Will attempt another time ? ?[name],HILLARY ?02/02/2022, 3:03 PM ?Luisa Dago, OT/L  ? ?Acute OT Clinical Specialist ?Acute Rehabilitation Services ?Pager [phone] ?Office [phone]  ?

## 2022-02-02 NOTE — Progress Notes (Signed)
OT Cancellation Note ? ?Patient Details ?Name: Charles Marquez [facility] ?MRN: [id number] ?DOB: Nov 21, 1955 ? ? ?Cancelled Treatment:    Reason Eval/Treat Not Completed: Active bedrest order. Will assess when activity orders updated. Thanks. ? ?Arriana Lohmann,Charles Marquez ?02/02/2022, 8:32 AM ?Maurie Boettcher, OT/L  ? ?Acute OT Clinical Specialist ?Acute Rehabilitation Services ?Pager [phone] ?Office [phone]  ?

## 2022-02-02 NOTE — Progress Notes (Signed)
Spoke with Megan from neurosurgery; they will be unable to program patient's [facility] shunt tonight. States he will be ok to wait until the morning. Patient made aware.  ?

## 2022-02-02 NOTE — Plan of Care (Signed)
?  Problem: Education: ?Goal: Knowledge of General Education information will improve ?Description: Including pain rating scale, medication(s)/side effects and non-pharmacologic comfort measures ?Outcome: Progressing ?  ?Problem: Health Behavior/Discharge Planning: ?Goal: Ability to manage health-related needs will improve ?Outcome: Progressing ?  ?Problem: Clinical Measurements: ?Goal: Ability to maintain clinical measurements within normal limits will improve ?Outcome: Progressing ?Goal: Will remain free from infection ?Outcome: Progressing ?Goal: Diagnostic test results will improve ?Outcome: Progressing ?Goal: Respiratory complications will improve ?Outcome: Progressing ?Goal: Cardiovascular complication will be avoided ?Outcome: Progressing ?  ?Problem: Activity: ?Goal: Risk for activity intolerance will decrease ?Outcome: Progressing ?  ?Problem: Nutrition: ?Goal: Adequate nutrition will be maintained ?Outcome: Progressing ?  ?Problem: Coping: ?Goal: Level of anxiety will decrease ?Outcome: Progressing ?  ?Problem: Elimination: ?Goal: Will not experience complications related to bowel motility ?Outcome: Progressing ?Goal: Will not experience complications related to urinary retention ?Outcome: Progressing ?  ?Problem: Pain Managment: ?Goal: General experience of comfort will improve ?Outcome: Progressing ?  ?Problem: Safety: ?Goal: Ability to remain free from injury will improve ?Outcome: Progressing ?  ?Problem: Education: ?Goal: Knowledge of disease or condition will improve ?Outcome: Progressing ?  ?Problem: Coping: ?Goal: Will verbalize positive feelings about self ?Outcome: Progressing ?  ?Problem: Health Behavior/Discharge Planning: ?Goal: Ability to manage health-related needs will improve ?Outcome: Progressing ?  ?Problem: Self-Care: ?Goal: Ability to participate in self-care as condition permits will improve ?Outcome: Progressing ?Goal: Verbalization of feelings and concerns over difficulty with  self-care will improve ?Outcome: Progressing ?Goal: Ability to communicate needs accurately will improve ?Outcome: Progressing ?  ?Problem: Nutrition: ?Goal: Risk of aspiration will decrease ?Outcome: Progressing ?Goal: Dietary intake will improve ?Outcome: Progressing ?  ?Problem: Ischemic Stroke/TIA Tissue Perfusion: ?Goal: Complications of ischemic stroke/TIA will be minimized ?Outcome: Progressing ?  ?

## 2022-02-02 NOTE — Evaluation (Signed)
Speech Language Pathology Evaluation ?Patient Details ?Name: Charles Marquez [facility] ?MRN: [id number] ?DOB: 1955/12/27 ?Today's Date: 02/02/2022 ?Time: 1000-1010 ?SLP Time Calculation (min) (ACUTE ONLY): 10 min ? ?Problem List:  ?Patient Active Problem List  ? Diagnosis Date Noted  ? 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 Visual disturbance   ? Slow transit constipation   ? Hypoalbuminemia due to protein-calorie malnutrition (HCC)   ? Acute blood loss anemia   ? S/P VP shunt   ? Hydrocephalus (HCC) 08/30/2018  ? Dyslipidemia   ? History of CVA (cerebrovascular accident)   ? Benign essential HTN   ? Tachycardia   ? Leukocytosis   ? Hyponatremia   ? Hypokalemia   ? 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 Bacterial meningitis 08/09/2018  ? TIA (transient ischemic attack) 01/29/2017  ? Acute encephalopathy   ? Shunt malfunction 03/13/2016  ? 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 Hyperlipidemia 10/20/2014  ? Degenerative disc disease, lumbar 04/15/2013  ? Routine general medical examination at a health care facility 07/23/2012  ? DISTURBANCE OF SKIN SENSATION 10/05/2008  ? HYPERLIPIDEMIA 01/01/2008  ? MYCOPLASMA PNEUMONIA 01/01/2008  ? ?Past Medical History:  ?Past Medical History:  ?Diagnosis Date  ? Anxiety   ? Hypercholesteremia   ? Stroke [facility])   ? tia's  ? TIA (transient ischemic attack)   ? 09.15  ? ?Past Surgical History:  ?Past Surgical History:  ?Procedure Laterality Date  ? Fractured arm Left 12  ? HERNIA REPAIR Right 3/12  ? LAPAROSCOPIC REVISION VENTRICULAR-PERITONEAL (V-P) SHUNT N/A 08/26/2018  ? Procedure: LAPAROSCOPIC INSERTION VENTRICULAR-PERITONEAL (V-P) SHUNT;  Surgeon: Julio Sicks, MD;  Location: [facility] OR;  Service: Neurosurgery;  Laterality: N/A;  ? LAPAROSCOPIC REVISION VENTRICULAR-PERITONEAL (V-P) SHUNT N/A 01/17/2019  ? Procedure: LAPAROSCOPIC REVISION VENTRICULAR-PERITONEAL (V-P) SHUNT;  Surgeon: Manus Rudd, MD;  Location: [facility] OR;  Service: General;  Laterality: N/A;  ? LOOP RECORDER INSERTION N/A 04/10/2017  ? Procedure: Loop Recorder Insertion;  Surgeon: Hillis Range, MD;  Location: [facility] INVASIVE CV LAB;  Service: Cardiovascular;  Laterality: N/A;  ? SHUNT REMOVAL Right 03/13/2016  ? Procedure: SHUNT REMOVAL;  Surgeon: Julio Sicks, MD;  Location: [facility] NEURO ORS;  Service: Neurosurgery;  Laterality: Right;  ? SHUNT REMOVAL Right 08/09/2018  ? Procedure: SHUNT REMOVAL With Placement of Ventricular Catheter;  Surgeon: Lisbeth Renshaw, MD;  Location: [facility] OR;  Service: Neurosurgery;  Laterality: Right;  ? SHUNT REVISION Right 08/05/2018  ? Procedure: SHUNT REVISION;  Surgeon: Julio Sicks, MD;  Location: [facility] OR;  Service: Neurosurgery;  Laterality: Right;  ? SHUNT REVISION Left 12/06/2018  ? Procedure: Shunt Revision - left;  Surgeon: Julio Sicks, MD;  Location: [facility] OR;  Service: Neurosurgery;  Laterality: Left;  Shunt Revision - left  ? SHUNT REVISION N/A 12/09/2018  ? Procedure: SHUNT REVISION;  Surgeon: Julio Sicks, MD;  Location: [facility] OR;  Service: Neurosurgery;  Laterality: N/A;  ? SHUNT REVISION Left 01/17/2019  ? Procedure: Shunt Revision - left;  Surgeon: Julio Sicks, MD;  Location: [facility] OR;  Service: Neurosurgery;  Laterality: Left;  ? SHUNT REVISION Left 04/23/2019  ? Procedure: Left occipital VP shunt revision with placement of Ventriculo-Atrial Shunt;  Surgeon: Julio Sicks, MD;  Location: [facility] OR;  Service: Neurosurgery;  Laterality: Left;  ? SHUNT REVISION VENTRICULAR-PERITONEAL Left 08/26/2018  ? Procedure: SHUNT REVISION VENTRICULAR-PERITONEAL;  Surgeon: Julio Sicks, MD;  Location: [facility] OR;  Service: Neurosurgery;  Laterality: Left;  ? SHUNT REVISION  VENTRICULAR-PERITONEAL Left 09/02/2018  ? Procedure: Left Occipital VP shunt revision;  Surgeon: Julio Sicks, MD;  Location: [facility] OR;  Service: Neurosurgery;  Laterality: Left;  ? VASECTOMY  10/02/1997  ? VENTRICULOPERITONEAL SHUNT Right 12/18/2014  ? Procedure: Shunt Placment -  right occipital VP shunt ;  Surgeon: Temple Pacini, MD;  Location: [facility] NEURO ORS;  Service: Neurosurgery;  Laterality: Right;  Shunt Placment - right occipital VP shunt   ? VENTRICULOPERITONEAL SHUNT Right 07/22/2018  ? Procedure: Shunt Placment right occipital;  Surgeon: Julio Sicks, MD;  [facility] OR;  Service: Neurosurgery;  Laterality: Right;  ? VENTRICULOPERITONEAL SHUNT Left 08/26/2018  ? Procedure: LEFT SIDED VENTRICULAR-PERITONEAL SHUNT;  Surgeon: Julio Sicks, MD;  Location: [facility] OR;  Service: Neurosurgery;  Laterality: Left;  ? VENTRICULOSTOMY Right 08/09/2018  ? Procedure: VENTRICULOSTOMY;  Surgeon: Lisbeth Renshaw, MD;  Location: [facility] OR;  Service: Neurosurgery;  Laterality: Right;  ? ?HPI:  ?Charles Marquez is a 66 y.o. 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  ? ?Assessment / Plan / Recommendation ?Clinical Impression ? Pt demonstrates a mild change in baseline speech ability. Pt had a prior aphasia/apraxia in 2019 that completely resolved per pt and family. However, yesterday he again had an acute change of inability to speak. Today pt is fluent and 99% accurate at conversation and reading level. An observant or familiar listener would note that ps is a little more deliberate and slow in his conversational speech. He exhibited a paraphasia/apraxic error in conversation and then a word substitution while reading. Pt is 100% intelligible and rapidly improving since yesterday. Recommend f/u with OP SLP if mild impairment persists after d/c. Family and pt are aware of this process and will f/u as needed. ?   ?SLP Assessment ? SLP Recommendation/Assessment: All further Speech Lanaguage Pathology  needs can be addressed in the next venue of care ?SLP Visit Diagnosis: Apraxia (R48.2)  ?  ?Recommendations for follow up therapy  are one component of a multi-disciplinary discharge planning process, led by the attending physician.  Recommendations may be updated based on patient status, additional functional criteria and insurance authorization. ?   ?Follow Up Recommendations ? Outpatient SLP  ?  ?Assistance Recommended at Discharge ?    ?Functional Status Assessment    ?Frequency and Duration    ?  ?  ?   ?SLP Evaluation ?Cognition ? Orientation Level: Oriented X4  ?  ?   ?Comprehension ? Auditory Comprehension ?Overall Auditory Comprehension: Appears within functional limits for tasks assessed  ?  ?Expression Verbal Expression ?Overall Verbal Expression: Appears within functional limits for tasks assessed ?Initiation: No impairment ?Automatic Speech: Name;Social Response ?Level of Generative/Spontaneous Verbalization: Conversation ?Repetition: No impairment ?Naming: No impairment ?Pragmatics: No impairment   ?Oral / Motor ? Oral Motor/Sensory Function ?Overall Oral Motor/Sensory Function: Within functional limits ?Motor Speech ?Overall Motor Speech: Impaired ?Respiration: Within functional limits ?Phonation: Normal ?Resonance: Within functional limits ?Articulation: Impaired ?Level of Impairment: Conversation ?Intelligibility: Intelligible ?Motor Planning: Impaired ?Level of Impairment: Word ?Motor Speech Errors: Groping for words;Inconsistent;Aware   ?        ? ?Edwardine Deschepper, Riley Nearing ?02/02/2022, 11:20 AM ? ?

## 2022-02-02 NOTE — Progress Notes (Addendum)
STROKE TEAM PROGRESS NOTE  ? ?ATTENDING NOTE: ?I reviewed above note and agree with the assessment and plan. Pt was seen and examined.  ? ?66 year old male with history of hypertension, hyperlipidemia, TIA, hydrocephalus status post VP shunt admitted for aphasia.  CT no acute abnormality, VP shunt stable old right frontal infarct.  Status post TNK.  CTA head and neck unremarkable.  Repeat CT negative, MRI no acute infarct, no hydrocephalus.  EF 60 to 65%.  LDL 76, A1c 5.3.  EEG cortical dysfunction in left temporoparietal region likely secondary to underlying shunt.  UDS negative.  Creatinine 1.00 ? ?History of TIA ?2009 no details, ? transient left body paresthesia  ?TIA on 10/19/14 admitted for sudden onset of tingling involving the right side of the face as well as right fingers, and some difficulty speaking with some garbled words. CT scan of the head as well as an MRI scan showed no acute infarct but showed hydrocephalus. MRA and carotid Doppler negative.  EF 60 to 65%.  Discharged on Plavix.  ?01/2017 patient admitted for right hand numbness and difficulty speaking.  MRI, MRA, carotid Doppler all negative.  EF 60 to 65%.  LDL 47.  There was a concern of A-fib on telemetry, had a 30-day CardioNet monitoring in 03/2017 no A-fib.  Loop recorder placed after. ?Loop recorder no A-fib during recording. ? ?Currently patient neuro intact no aphasia or focal deficit.  Patient had history of multiple TIA with right-sided paresthesia and aphasia, MRI all negative.  Currently event also with aphasia, MRI again negative.  Patient has no memory of yesterday event.  Combine all this findings, concerning for seizure.  Patient does have left parietal region VP shunt placement, wondering whether that could be a seizure focus.  Will need further discuss with patient family for more information regarding current event, if still suspicion for seizure, will put on AED.  Continue Plavix and Lipitor.  He will continue to follow-up with  Dr. Pearlean Brownie at [facility]. ? ?For detailed assessment and plan, please refer to above as I have made changes wherever appropriate.  ? ?Marvel Plan, MD PhD ?Stroke Neurology ?02/02/2022 ?7:14 PM ? ?This patient is critically ill due to strokelike symptoms, status post TNK and at significant risk of neurological worsening, death form recurrent stroke, seizure, status epilepticus, bleeding from TNK. This patient's care requires constant monitoring of vital signs, hemodynamics, respiratory and cardiac monitoring, review of multiple databases, neurological assessment, discussion with family, other specialists and medical decision making of high complexity. I spent 35 minutes of neurocritical care time in the care of this patient. ? ? ? ?INTERVAL HISTORY ?His last memory was Wednesday am being at home after dropping logs off at daughter's house. He does not remember having lunch. Next memory is waking up in hospital with wife and kids at bedside. He was on the phone with his daughter, which he does not remember, and not making sense. She then went to his house and called EMS. The only residual neurological deficit he has noticed since his shunt placement and revision is some difficulty with his short term memory. ? ?EMS reported Rt facial droop, Lt side neglect, and receptive and expressive aphasia ?Did have a loop recorder placed and saw Dr. Elberta Fortis ?[facility] patient in 2017- Pearlean Brownie ? ? ?Vitals:  ? 02/02/22 0400 02/02/22 0500 02/02/22 0600 02/02/22 0700  ?BP: 128/81 113/85 (!) 140/99 128/90  ?Pulse: 61 (!) 59 62 60  ?Resp: 12 12 13 15   ?Temp: 97.8 ?F (36.6 ?C)     ?  TempSrc: Oral     ?SpO2: 100% 97% 99% 99%  ?Weight:      ?Height:      ? ?CBC:  ?Recent Labs  ?Lab 02/01/22 ?1649 02/01/22 ?1650  ?WBC 7.7  --   ?NEUTROABS 4.7  --   ?HGB 14.0 14.3  ?HCT 42.9 42.0  ?MCV 96.0  --   ?PLT 153  --   ? ?Basic Metabolic Panel:  ?Recent Labs  ?Lab 02/01/22 ?1649 02/01/22 ?1650 02/01/22 ?1811  ?NA 138 138  --   ?K 4.1 4.1  --   ?CL 105 101  --   ?CO2  24  --   --   ?GLUCOSE 91 89  --   ?BUN 17 20  --   ?CREATININE 1.02 1.00  --   ?CALCIUM 8.9  --   --   ?MG  --   --  2.1  ?PHOS  --   --  3.6  ? ?Lipid Panel:  ?Recent Labs  ?Lab 02/02/22 ?0444  ?CHOL 126  ?TRIG 71  ?HDL 36*  ?CHOLHDL 3.5  ?VLDL 14  ?LDLCALC 76  ? ?HgbA1c:  ?Recent Labs  ?Lab 02/02/22 ?0444  ?HGBA1C 5.3  ? ?Urine Drug Screen:  ?Recent Labs  ?Lab 02/01/22 ?1754  ?LABOPIA NONE DETECTED  ?COCAINSCRNUR NONE DETECTED  ?LABBENZ NONE DETECTED  ?AMPHETMU NONE DETECTED  ?THCU NONE DETECTED  ?LABBARB NONE DETECTED  ?  ?Alcohol Level  ?Recent Labs  ?Lab 02/01/22 ?1811  ?ETH <10  ? ? ?IMAGING past 24 hours ?CT HEAD WO CONTRAST ( ) ? ?Result Date: 02/02/2022 ?CLINICAL DATA:  Stroke follow-up EXAM: CT HEAD WITHOUT CONTRAST TECHNIQUE: Contiguous axial images were obtained from the base of the skull through the vertex without intravenous contrast. RADIATION DOSE REDUCTION: This exam was performed according to the departmental dose-optimization program which includes automated exposure control, adjustment of the mA and/or kV according to patient size and/or use of iterative reconstruction technique. COMPARISON:  None. FINDINGS: Brain: Bilateral posterior predominant hypodense periventricular change. There is a left parietal approach shunt catheter that terminates near the right foramen of ovale. Unchanged appearance of old infarct of the right frontal lobe. Vascular: No abnormal hyperdensity of the major intracranial arteries or dural venous sinuses. No intracranial atherosclerosis. Skull: The visualized skull base, calvarium and extracranial soft tissues are normal. Sinuses/Orbits: No fluid levels or advanced mucosal thickening of the visualized paranasal sinuses. No mastoid or middle ear effusion. The orbits are normal. Other: None. IMPRESSION: 1. No acute hemorrhage or mass lesion. 2. 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 3. Unchanged position of left parietal approach shunt catheter.  Electronically Signed   By: Deatra Robinson M.D.   On: 02/02/2022 00:09  ? ?DG Chest Port 1 View ? ?Result Date: 02/02/2022 ?CLINICAL DATA:  65 year old male with suspected stroke. EXAM: PORTABLE CHEST 1 VIEW COMPARISON:  Chest x-ray 04/23/2019. FINDINGS: There is a left-sided internal jugular central venous catheter with tip terminating in the mid superior vena cava. Lung volumes are normal. No consolidative airspace disease. No pleural effusions. No pneumothorax. No pulmonary nodule or mass noted. Pulmonary vasculature and the cardiomediastinal silhouette are within normal limits. Electronic device projecting over the left hemithorax, likely an implantable loop recorder. IMPRESSION: 1. Support apparatus, as above. 2. No radiographic evidence of acute cardiopulmonary disease. Electronically Signed   By: Trudie Reed M.D.   On: 02/02/2022 06:02  ? ?CT HEAD CODE STROKE WO CONTRAST ? ?Result Date: 02/01/2022 ?CLINICAL DATA:  Code stroke.  Acute  neuro deficit.  Aphasia EXAM: CT HEAD WITHOUT CONTRAST TECHNIQUE: Contiguous axial images were obtained from the base of the skull through the vertex without intravenous contrast. RADIATION DOSE REDUCTION: This exam was performed according to the departmental dose-optimization program which includes automated exposure control, adjustment of the mA and/or kV according to patient size and/or use of iterative reconstruction technique. COMPARISON:  CT head 02/08/2021 FINDINGS: Brain: Left parietal shunt catheter crosses the midline into the right lateral ventricle unchanged. Ventricles are small in size unchanged. Hypodensity in the parietal lobes bilaterally unchanged. Hypodensity right frontal lobe unchanged. Periventricular white matter hypodensity bilaterally appears chronic. Negative for acute infarct, hemorrhage, mass Vascular: Negative for hyperdense vessel Skull: No acute abnormality. Right frontal burr hole. Left parietal and right parietal burr hole. Sinuses/Orbits:  Paranasal sinuses clear.  Negative orbit Other: None ASPECTS (Alberta Stroke Program Early CT Score) - Ganglionic level infarction (caudate, lentiform nuclei, internal capsule, insula, M1-M3 cortex): 7 - Supragang

## 2022-02-02 NOTE — Progress Notes (Signed)
EEG completed, results pending. 

## 2022-02-02 NOTE — Procedures (Signed)
Patient Name: Charles Marquez  ?MRN: [id number]  ?Epilepsy Attending: Charlsie Quest  ?Referring Physician/Provider: Elmer Picker, NP ?Date: 02/02/2022 ?Duration: 23.34 mins ? ?Patient history: 66 y.o. male PMHx as noted above, TIA (on clopidogrel) VP shunt placement with acute onset severe global aphasia. EEG to evaluate for seizure ? ?Level of alertness: Awake ? ?AEDs during EEG study: None ? ?Technical aspects: This EEG study was done with scalp electrodes positioned according to the 10-20 International system of electrode placement. Electrical activity was acquired at a sampling rate of 500Hz  and reviewed with a high frequency filter of 70Hz  and a low frequency filter of 1Hz . EEG data were recorded continuously and digitally stored.  ? ?Description: The posterior dominant rhythm consists of 8 Hz activity of moderate voltage (25-35 uV) seen predominantly in posterior head regions, symmetric and reactive to eye opening and eye closing. EEG showed intermittent generalized and maximal left temporoparietal region 3 to 6 Hz theta-delta slowing. Hyperventilation and photic stimulation were not performed.    ? ?ABNORMALITY ?- Intermittent slow, generalized and maximal left temporoparietal region ? ?IMPRESSION: ?This study is suggestive of cortical dysfunction in left temporoparietal region likely secondary to underlying shunt.  Additionally there is mild diffuse encephalopathy, nonspecific etiology. No seizures or epileptiform discharges were seen throughout the recording. ? ?  ? ?

## 2022-02-03 DIAGNOSIS — Z8673 Personal history of transient ischemic attack (TIA), and cerebral infarction without residual deficits: Secondary | ICD-10-CM

## 2022-02-03 DIAGNOSIS — E7800 Pure hypercholesterolemia, unspecified: Secondary | ICD-10-CM

## 2022-02-03 LAB — BASIC METABOLIC PANEL
Anion gap: 7 (ref 5–15)
BUN: 12 mg/dL (ref 8–23)
CO2: 25 mmol/L (ref 22–32)
Calcium: 8.9 mg/dL (ref 8.9–10.3)
Chloride: 105 mmol/L (ref 98–111)
Creatinine, Ser: 0.94 mg/dL (ref 0.61–1.24)
GFR, Estimated: >60 mL/min (ref >60)
Glucose, Bld: 99 mg/dL (ref 70–99)
Potassium: 3.8 mmol/L (ref 3.5–5.1)
Sodium: 137 mmol/L (ref 135–145)

## 2022-02-03 LAB — CBC
HCT: 41.8 % (ref 39.0–52.0)
Hemoglobin: 14 g/dL (ref 13.0–17.0)
MCH: 31.4 pg (ref 26.0–34.0)
MCHC: 33.5 g/dL (ref 30.0–36.0)
MCV: 93.7 fL (ref 80.0–100.0)
Platelets: 158 10*3/uL (ref 150–400)
RBC: 4.46 MIL/uL (ref 4.22–5.81)
RDW: 12.7 % (ref 11.5–15.5)
WBC: 6.1 10*3/uL (ref 4.0–10.5)
nRBC: 0 % (ref 0.0–0.2)

## 2022-02-03 MED ORDER — LEVETIRACETAM 500 MG PO TABS: 500.0000 mg | ORAL_TABLET | Freq: Two times a day (BID) | ORAL | 1 refills | Status: DC

## 2022-02-03 MED ORDER — POTASSIUM CHLORIDE CRYS ER 20 MEQ PO TBCR
40.0000 meq | EXTENDED_RELEASE_TABLET | Freq: Once | ORAL | Status: AC
  Administered 2022-02-03: 40 meq via ORAL
  Filled 2022-02-03: qty 2

## 2022-02-03 MED ORDER — LEVETIRACETAM 500 MG PO TABS
500.0000 mg | ORAL_TABLET | Freq: Two times a day (BID) | ORAL | Status: DC
  Administered 2022-02-03: 500 mg via ORAL
  Filled 2022-02-03: qty 1

## 2022-02-03 MED ORDER — ACETAMINOPHEN 500 MG PO TABS: 1000.0000 mg | ORAL_TABLET | Freq: Three times a day (TID) | ORAL | 0 refills | Status: AC | PRN

## 2022-02-03 NOTE — Progress Notes (Signed)
OT Cancellation Note ? ?Patient Details ?Name: Jocsan Mcginley [facility] ?MRN: [id number] ?DOB: [birth year] ? ? ?Cancelled Treatment:    Reason Eval/Treat Not Completed: OT screened, no needs identified, will sign off Familiar with pt from previous admissions. Pt close to baseline/family agrees.  ? ?Jerid Catherman,HILLARY ?02/03/2022, 10:09 AM ?

## 2022-02-03 NOTE — Plan of Care (Signed)
Reviewed care plan with patient and wife ?

## 2022-02-03 NOTE — Plan of Care (Signed)
?  Problem: Education: ?Goal: Knowledge of General Education information will improve ?Description: Including pain rating scale, medication(s)/side effects and non-pharmacologic comfort measures ?Outcome: Progressing ?  ?Problem: Health Behavior/Discharge Planning: ?Goal: Ability to manage health-related needs will improve ?Outcome: Progressing ?  ?Problem: Clinical Measurements: ?Goal: Ability to maintain clinical measurements within normal limits will improve ?Outcome: Progressing ?Goal: Cardiovascular complication will be avoided ?Outcome: Progressing ?  ?Problem: Education: ?Goal: Knowledge of disease or condition will improve ?Outcome: Progressing ?Goal: Knowledge of secondary prevention will improve (SELECT ALL) ?Outcome: Progressing ?Goal: Knowledge of patient specific risk factors will improve (INDIVIDUALIZE FOR PATIENT) ?Outcome: Progressing ?Goal: Individualized Educational Video(s) ?Outcome: Progressing ?  ?Problem: Health Behavior/Discharge Planning: ?Goal: Ability to manage health-related needs will improve ?Outcome: Progressing ?  ?Problem: Clinical Measurements: ?Goal: Will remain free from infection ?Outcome: Completed/Met ?Goal: Diagnostic test results will improve ?Outcome: Completed/Met ?Goal: Respiratory complications will improve ?Outcome: Completed/Met ?  ?Problem: Activity: ?Goal: Risk for activity intolerance will decrease ?Outcome: Completed/Met ?  ?Problem: Nutrition: ?Goal: Adequate nutrition will be maintained ?Outcome: Completed/Met ?  ?Problem: Coping: ?Goal: Level of anxiety will decrease ?Outcome: Completed/Met ?  ?Problem: Elimination: ?Goal: Will not experience complications related to bowel motility ?Outcome: Completed/Met ?Goal: Will not experience complications related to urinary retention ?Outcome: Completed/Met ?  ?Problem: Pain Managment: ?Goal: General experience of comfort will improve ?Outcome: Completed/Met ?  ?Problem: Safety: ?Goal: Ability to remain free from injury will  improve ?Outcome: Completed/Met ?  ?Problem: Coping: ?Goal: Will verbalize positive feelings about self ?Outcome: Completed/Met ?  ?Problem: Self-Care: ?Goal: Ability to participate in self-care as condition permits will improve ?Outcome: Completed/Met ?Goal: Verbalization of feelings and concerns over difficulty with self-care will improve ?Outcome: Completed/Met ?Goal: Ability to communicate needs accurately will improve ?Outcome: Completed/Met ?  ?Problem: Nutrition: ?Goal: Risk of aspiration will decrease ?Outcome: Completed/Met ?Goal: Dietary intake will improve ?Outcome: Completed/Met ?  ?Problem: Ischemic Stroke/TIA Tissue Perfusion: ?Goal: Complications of ischemic stroke/TIA will be minimized ?Outcome: Completed/Met ?  ?

## 2022-02-03 NOTE — Progress Notes (Signed)
? ?  Providing Compassionate, Quality Care - Together ? ? ? ? ? ? ?Patient's shunt reprogrammed to 1.5. ? ? ? ? ? ?Val Eagle, DNP, AGNP-C ?Nurse Practitioner ? ?[facility] ?[address], [place], [place] [address] ?P: [phone]    F: [phone] ? ? ?02/03/2022 9:58 AM ? ?

## 2022-02-03 NOTE — TOC CAGE-AID Note (Signed)
Transition of Care (TOC) - CAGE-AID Screening ? ? ?Patient Details  ?Name: Charles Marquez [facility] ?MRN: [id number] ?Date of Birth: 05-21-1956 ? ?Transition of Care (TOC) CM/SW Contact:    ?Scott Vanderveer C Tarpley-Carter, LCSWA ?Phone Number: ?02/03/2022, 2:21 PM ? ? ?Clinical Narrative: ?Pt participated in Cage-Aid.  Per pts wife, pt does not use substance or ETOH.  Pt was not offered resources, due to no usage of substance or ETOH.   ? ?Insurance underwriter, MSW, LCSW-A ?Pronouns:  She/Her/Hers ?[facility] ?Clinical Social Worker ?Direct Number:  [phone] ?[email or web address] ? ?CAGE-AID Screening: ?Substance Abuse Screening unable to be completed due to: : Patient unable to participate ? ?Have You Ever Felt You Ought to Cut Down on Your Drinking or Drug Use?: No ?Have People Annoyed You By Critizing Your Drinking Or Drug Use?: No ?Have You Felt Bad Or Guilty About Your Drinking Or Drug Use?: No ?Have You Ever Had a Drink or Used Drugs First Thing In The Morning to Steady Your Nerves or to Get Rid of a Hangover?: No ?CAGE-AID Score: 0 ? ?Substance Abuse Education Offered: No ? ?  ? ? ? ? ? ? ?

## 2022-02-03 NOTE — Discharge Instructions (Addendum)
[name], you were admitted with acute onset aphasia and were treated with TNK.  Your symptoms resolved, but MRI did not show signs of a stroke.  It is possible that your symptoms were caused by a seizure, so an antiepileptic medication, Keppra, was prescribed for you.  You will follow up with Dr. Pearlean Brownie in the clinic.  Please do not drive until you are 6 months seizure free and have been cleared by your neurologist. ?

## 2022-02-03 NOTE — Discharge Summary (Addendum)
Stroke Discharge Summary  ? ?ATTENDING NOTE: ?I reviewed above note and agree with the assessment and plan. Pt was seen and examined.  ? ?Pt sitting in chair, wife at the bedside. Recounted HPI with wife and pt had no recollection with the event. Wife reported aphasia, ? Staring off, right hand repetitive movement with movement on the left hand. Pt had hx of "TIA" with largely stereotypical symptoms and stroke/TIA work up always negative, raising concern of seizure. Discussed with wife and pt, put on keppra trial of 500mg  bid. Continue home plavix and statin. PT/OT no recs. He will follow [facility] with [facility].  ? ?For detailed assessment and plan, please refer to above as I have made changes wherever appropriate.  ? ?Marvel PlanJindong Zyeir Dymek, MD PhD ?Stroke Neurology ?02/03/2022 ?1:36 PM ? ?Patient ID: Charles SquibbKurt Edward Myre    l ?  MRN: [id number]    ?  DOB: 1956-02-15 ? ?Date of Admission: 02/01/2022 ?Date of Discharge: 02/03/2022 ? ?Attending Physician:  Stroke, Md, MD, Stroke MD ?Consultant(s):   Treatment Team:  ?Julio SicksPool, Henry, MD  neurosurgery   ?Patient's PCP:  Eartha InchBadger, Michael C, MD ? ?DISCHARGE DIAGNOSIS:  ?Seizure activity vs. TIA ? ?Other active Problem: ?  Hx of hydrocephalus s/p VA Shunt ?  Hx of TIA ?  HTN ?  HLD ? ? ?Allergies as of 02/03/2022   ?No Known Allergies ?  ? ?  ?Medication List  ?  ? ?TAKE these medications   ? ?acetaminophen 500 MG tablet ?Commonly known as: TYLENOL ?Take 2 tablets (1,000 mg total) by mouth every 8 (eight) hours as needed for moderate pain. ?What changed: when to take this ?  ?atorvastatin 40 MG tablet ?Commonly known as: LIPITOR ?Take 1 tablet (40 mg total) by mouth daily. ?What changed: when to take this ?  ?cetirizine 10 MG tablet ?Commonly known as: ZYRTEC ?Take 10 mg by mouth every evening. ?  ?clopidogrel 75 MG tablet ?Commonly known as: PLAVIX ?Take 75 mg by mouth every evening. ?  ?levETIRAcetam 500 MG tablet ?Commonly known as: KEPPRA ?Take 1 tablet (500 mg total) by mouth 2  (two) times daily. ?  ?PreserVision AREDS 2 Caps ?Take 1 capsule by mouth 2 (two) times daily. ?  ?Vitamin D 50 MCG (2000 UT) tablet ?Take 2,000 Units by mouth daily. ?  ? ?  ? ? ?LABORATORY STUDIES ?CBC ?   ?Component Value Date/Time  ? WBC 6.1 02/03/2022 0235  ? RBC 4.46 02/03/2022 0235  ? HGB 14.0 02/03/2022 0235  ? HCT 41.8 02/03/2022 0235  ? PLT 158 02/03/2022 0235  ? MCV 93.7 02/03/2022 0235  ? MCH 31.4 02/03/2022 0235  ? MCHC 33.5 02/03/2022 0235  ? RDW 12.7 02/03/2022 0235  ? LYMPHSABS 1.9 02/01/2022 1649  ? MONOABS 0.9 02/01/2022 1649  ? EOSABS 0.1 02/01/2022 1649  ? BASOSABS 0.0 02/01/2022 1649  ? ?CMP ?   ?Component Value Date/Time  ? NA 137 02/03/2022 0235  ? K 3.8 02/03/2022 0235  ? CL 105 02/03/2022 0235  ? CO2 25 02/03/2022 0235  ? GLUCOSE 99 02/03/2022 0235  ? BUN 12 02/03/2022 0235  ? CREATININE 0.94 02/03/2022 0235  ? CALCIUM 8.9 02/03/2022 0235  ? PROT 6.7 02/01/2022 1649  ? ALBUMIN 4.0 02/01/2022 1649  ? AST 33 02/01/2022 1649  ? ALT 26 02/01/2022 1649  ? ALKPHOS 50 02/01/2022 1649  ? BILITOT 1.1 02/01/2022 1649  ? GFRNONAA >60 02/03/2022 0235  ? GFRAA >60 04/21/2019  1722  ? ?COAGS ?Lab Results  ?Component Value Date  ? INR 1.4 (H) 02/01/2022  ? INR 1.11 11/05/2018  ? INR 1.08 08/25/2018  ? ?Lipid Panel ?   ?Component Value Date/Time  ? CHOL 126 02/02/2022 0444  ? TRIG 71 02/02/2022 0444  ? HDL 36 (L) 02/02/2022 0444  ? CHOLHDL 3.5 02/02/2022 0444  ? VLDL 14 02/02/2022 0444  ? LDLCALC 76 02/02/2022 0444  ? ?HgbA1C  ?Lab Results  ?Component Value Date  ? HGBA1C 5.3 02/02/2022  ? ?Urinalysis ?   ?Component Value Date/Time  ? COLORURINE STRAW (A) 02/01/2022 1754  ? APPEARANCEUR CLEAR 02/01/2022 1754  ? LABSPEC 1.012 02/01/2022 1754  ? PHURINE 7.0 02/01/2022 1754  ? GLUCOSEU NEGATIVE 02/01/2022 1754  ? GLUCOSEU NEGATIVE 03/28/2010 0845  ? HGBUR NEGATIVE 02/01/2022 1754  ? HGBUR negative 10/29/2008 0906  ? BILIRUBINUR NEGATIVE 02/01/2022 1754  ? BILIRUBINUR neg 04/15/2013 4854  ? Lavenia Atlas NEGATIVE  02/01/2022 1754  ? PROTEINUR NEGATIVE 02/01/2022 1754  ? UROBILINOGEN 0.2 04/15/2013 0927  ? UROBILINOGEN 0.2 03/28/2010 0845  ? NITRITE NEGATIVE 02/01/2022 1754  ? LEUKOCYTESUR NEGATIVE 02/01/2022 1754  ? ?Urine Drug Screen  ?   ?Component Value Date/Time  ? LABOPIA NONE DETECTED 02/01/2022 1754  ? COCAINSCRNUR NONE DETECTED 02/01/2022 1754  ? LABBENZ NONE DETECTED 02/01/2022 1754  ? AMPHETMU NONE DETECTED 02/01/2022 1754  ? THCU NONE DETECTED 02/01/2022 1754  ? LABBARB NONE DETECTED 02/01/2022 1754  ?  ?Alcohol Level ?   ?Component Value Date/Time  ? ETH <10 02/01/2022 1811  ? ? ? ?SIGNIFICANT DIAGNOSTIC STUDIES ?CT HEAD WO CONTRAST ( ) ? ?Result Date: 02/02/2022 ?CLINICAL DATA:  Stroke follow-up EXAM: CT HEAD WITHOUT CONTRAST TECHNIQUE: Contiguous axial images were obtained from the base of the skull through the vertex without intravenous contrast. RADIATION DOSE REDUCTION: This exam was performed according to the departmental dose-optimization program which includes automated exposure control, adjustment of the mA and/or kV according to patient size and/or use of iterative reconstruction technique. COMPARISON:  None. FINDINGS: Brain: Bilateral posterior predominant hypodense periventricular change. There is a left parietal approach shunt catheter that terminates near the right foramen of ovale. Unchanged appearance of old infarct of the right frontal lobe. Vascular: No abnormal hyperdensity of the major intracranial arteries or dural venous sinuses. No intracranial atherosclerosis. Skull: The visualized skull base, calvarium and extracranial soft tissues are normal. Sinuses/Orbits: No fluid levels or advanced mucosal thickening of the visualized paranasal sinuses. No mastoid or middle ear effusion. The orbits are normal. Other: None. IMPRESSION: 1. No acute hemorrhage or mass lesion. 2. 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 3. Unchanged position of left parietal approach shunt catheter.  Electronically Signed   By: Deatra Robinson M.D.   On: 02/02/2022 00:09  ? ?[name] CONTRAST ? ?Result Date: 02/02/2022 ?CLINICAL DATA:  Stroke, follow up; 24 hour TNK followup. EXAM: MRI HEAD WITHOUT CONTRAST TECHNIQUE: Multiplanar, multiecho pulse sequences of the brain and surrounding structures were obtained without intravenous contrast. COMPARISON:  Head CT February 01, 2022; MRI of the brain February 04, 2019. FINDINGS: Brain: No focus of restricted diffusion to suggest an acute infarct. However, prominent susceptibility artifact from the VP shunt valve in the left parietal scalp obscures the posterior left hemisphere and left cerebellar hemisphere. No acute hemorrhage, extra-axial collection or mass lesion. Unchanged appearance of left parietal approach ventricular drain with the tip in the body of right lateral ventricle. There is no hydrocephalus with small size of the  supratentorial ventricular system noting multiple septation within the left lateral ventricle and third ventricle, may represent sequela of prior inflammatory process. Elongated area of encephalomalacia in the right frontal and parietal lobes and in the left parietal lobe likely related to prior ventricular shunts with prominent gliosis in the bilateral parietal lobes. Confluent T2 hyperintensity within the periventricular white matter, a. Vascular: Normal flow voids. Skull and upper cervical spine: Normal marrow signal. Sinuses/Orbits: Negative. Other: None. IMPRESSION: 1. No acute intracranial abnormality. 2. Stable small ventricular system without evidence of hydrocephalus. 3. Stable chronic white matter changes, as described above. Electronically Signed   By: Baldemar Lenis M.D.   On: 02/02/2022 16:23  ? ?DG Chest Port 1 View ? ?Result Date: 02/02/2022 ?CLINICAL DATA:  66 year old male with suspected stroke. EXAM: PORTABLE CHEST 1 VIEW COMPARISON:  Chest x-ray 04/23/2019. FINDINGS: There is a left-sided internal jugular central  venous catheter with tip terminating in the mid superior vena cava. Lung volumes are normal. No consolidative airspace disease. No pleural effusions. No pneumothorax. No pulmonary nodule or mass noted. Pulmon

## 2022-02-22 MED FILL — Tenecteplase For IV Soln Kit 50 MG: INTRAVENOUS | Qty: 24 | Status: AC

## 2022-02-22 MED FILL — Tenecteplase For IV Soln Kit 50 MG: INTRAVENOUS | Qty: 26 | Status: AC

## 2022-03-30 ENCOUNTER — Inpatient Hospital Stay: Payer: 59 | Admitting: Neurology

## 2022-04-25 ENCOUNTER — Encounter: Payer: Self-pay | Admitting: Neurology

## 2022-04-25 ENCOUNTER — Ambulatory Visit: Payer: Medicare HMO | Admitting: Neurology

## 2022-04-25 VITALS — BP 134/80 | HR 87 | Ht 71.0 in | Wt 205.0 lb

## 2022-04-25 DIAGNOSIS — R2990 Unspecified symptoms and signs involving the nervous system: Secondary | ICD-10-CM

## 2022-04-25 DIAGNOSIS — G3184 Mild cognitive impairment, so stated: Secondary | ICD-10-CM

## 2022-04-25 DIAGNOSIS — R413 Other amnesia: Secondary | ICD-10-CM

## 2022-04-25 DIAGNOSIS — R4701 Aphasia: Secondary | ICD-10-CM | POA: Diagnosis not present

## 2022-04-25 DIAGNOSIS — G40209 Localization-related (focal) (partial) symptomatic epilepsy and epileptic syndromes with complex partial seizures, not intractable, without status epilepticus: Secondary | ICD-10-CM | POA: Diagnosis not present

## 2022-04-25 MED ORDER — CEREFOLIN 6-1-50-5 MG PO TABS: 1.0000 | ORAL_TABLET | ORAL | 3 refills | Status: AC

## 2022-04-25 NOTE — Progress Notes (Signed)
[facility] [address] [place]. [place] [address] [phone]       OFFICE CONSULT NOTE  Mr. Cabe Lashley Date of Birth:  Nov 06, 1956 Medical Record Number:  [id number]   Referring MD: Marvel Plan  Reason for Referral: TIA versus seizure  HPI: Mr. Eggebrecht is a 66 year old pleasant Caucasian male seen today for initial office consultation visit.  He is accompanied by his wife.  History is obtained from them and review of electronic medical records and I personally reviewed pertinent available imaging films in PACS.  He has past medical history for TIA, hydrocephalus s/p VP shunt, hyperlipidemia and anxiety.  He presented on 02/01/2022 with sudden onset of aphasia and speech difficulties.  NIH stroke scale was 5.  He was given IV thrombolysis with TNK.  Admitted to the ICU for close monitoring.  Blood pressure adequately controlled.  CT scan showed no acute abnormality and stable VP shunt and old right frontal infarct.  CT angiogram of the head and neck showed no large vessel stenosis or occlusion.  MRI scan of the brain showed no acute infarct though there were artifacts in the left parietal region from VP shunt.  2D echo showed ejection fraction of 60 to 65% without cardiac source of embolism.  LDL cholesterol was 76 mg percent and hemoglobin A1c was 5.3.  EEG showed focal cortical dysfunction in the left temporoparietal region but no definite epileptiform activity.  Urine drug screen was negative.  Patient was started on Keppra trial for seizure prophylaxis and since he has had several episodes of transient speech disturbance negative brain imaging.  Patient has previous episode of transient left body paresthesias in 2009.  In December 2015 he was admitted for right-sided face and finger tingling as well as difficulty speaking and garbled speech.  CT head was unremarkable MRI also did not show any acute stroke.  MRI brain and carotid Dopplers and echocardiogram was all  unremarkable.  In March 2018 he was admitted for episode of right hand numbness and difficulty speaking again again neurovascular imaging and cardiac monitoring was unremarkable.  There was a concern for A-fib on telemetry hence he had a 30-day heart monitor which was negative for A-fib subsequently had loop recorder and so far paroxysmal A-fib has not yet been found.  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 Dutch Quint.  Patient has not been working since 2019 some mild residual cognitive difficulties multiple shunt infections and surgeries.  Patient has remained on Plavix which is tolerating well with only minor bruising and no bleeding.  His blood pressure is well controlled usually and today it is 134/80.  Is also on Lipitor which is tolerating well without muscle aches and pains.  Patient is having mild short-term memory and cognitive difficulties but these appear not to be progressive.  Has not had specific cognitive evaluation for this.  On Mini-Mental status exam today scored 27/30 and clock drawing 4/4 and was able to recall 16 animals which can walk on 4 legs.  ROS:   14 system review of systems is positive for confusion, disorientation, memory difficulties, cognitive impairment and all other systems negative  PMH:  Past Medical History:  Diagnosis Date   Anxiety    Hypercholesteremia    Stroke (HCC)    tia's   TIA (transient ischemic attack)    09.15    Social History:  Social  History   Socioeconomic History   Marital status: Married    Spouse name: Not on file   Number of children: 2   Years of education: MASTERS   Highest education level: Not on file  Occupational History   Not on file  Tobacco Use   Smoking status: Never   Smokeless tobacco: Never  Vaping Use   Vaping Use: Never used  Substance and Sexual Activity   Alcohol use: Yes    Alcohol/week: 0.0 standard drinks  of alcohol    Comment: ocassionally   Drug use: No   Sexual activity: Not on file  Other Topics Concern   Not on file  Social History Narrative   Patient is married with 2 children.   Patient is right handed.   Patient has a Master's degree.   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   Social Determinants of Health   Financial Resource Strain: Not on file  Food Insecurity: Not on file  Transportation Needs: Not on file  Physical Activity: Not on file  Stress: Not on file  Social Connections: Not on file  Intimate Partner Violence: Not on file    Medications:   Current Outpatient Medications on File Prior to Visit  Medication Sig Dispense Refill   acetaminophen (TYLENOL) 500 MG tablet Take 2 tablets (1,000 mg total) by mouth every 8 (eight) hours as needed for moderate pain. 30 tablet 0   atorvastatin (LIPITOR) 40 MG tablet Take 1 tablet (40 mg total) by mouth daily. (Patient taking differently: Take 40 mg by mouth every evening.) 90 tablet 3   cetirizine (ZYRTEC) 10 MG tablet Take 10 mg by mouth every evening.     Cholecalciferol (VITAMIN D) 50 MCG (2000 UT) tablet Take 2,000 Units by mouth daily.     clopidogrel (PLAVIX) 75 MG tablet Take 75 mg by mouth every evening.     levETIRAcetam (KEPPRA) 500 MG tablet Take 1 tablet (500 mg total) by mouth 2 (two) times daily. 60 tablet 1   Multiple Vitamins-Minerals (PRESERVISION AREDS 2) CAPS Take 1 capsule by mouth 2 (two) times daily.     No current facility-administered medications on file prior to visit.    Allergies:  No Known Allergies  Physical Exam General: well developed, well nourished middle-aged Caucasian male, seated, in no evident distress Head: head normocephalic and atraumatic.   Neck: supple with no carotid or supraclavicular bruits Cardiovascular: regular rate and rhythm, no murmurs Musculoskeletal: no deformity Skin:  no  rash/petichiae Vascular:  Normal pulses all extremities  Neurologic Exam Mental Status: Awake and fully alert. Oriented to place and time. Recent and remote memory intact. Attention span, concentration and fund of knowledge appropriate. Mood and affect appropriate.  Diminished recall.  Mini-Mental status exam score 27/30 with deficits in orientation, attention and recall.  Able to name 17 animals which can walk on 4 legs.  Clock drawing 4/4. Cranial Nerves: Fundoscopic exam reveals sharp disc margins. Pupils equal, briskly reactive to light. Extraocular movements full without nystagmus. Visual fields full to confrontation. Hearing intact. Facial sensation intact. Face, tongue, palate moves normally and symmetrically.  Motor: Normal bulk and tone. Normal strength in all tested extremity muscles. Sensory.: intact to touch , pinprick , position and vibratory sensation.  Coordination: Rapid alternating movements normal in all extremities. Finger-to-nose and heel-to-shin performed accurately bilaterally. Gait and Station: Arises from chair without difficulty. Stance is normal. Gait demonstrates normal  stride length and balance . Able to heel, toe and tandem walk without difficulty.  Reflexes: 1+ and symmetric. Toes downgoing.   NIHSS  0 Modified Rankin  1   ASSESSMENT: 65 year old Caucasian male with episode of confusion disorientation and speech difficulties likely strokelike episode treated with IV TNK but negative brain imaging.  He also has memory difficulties due to mild cognitive impairment.  Patient has had multiple episodes of transient speech disturbance with negative brain imaging hence trial of Keppra for seizure prophylaxis is reasonable.     PLAN:I had a long discussion with the patient, his wife and daughter regarding his episode of confusion disorientation and speech difficulties likely strokelike episode versus complex partial seizure.  Recommend continue Plavix for stroke prevention  and aggressive risk factor modification strict control of hypertension blood pressure goal below 140/90, lipids with LDL cholesterol goal below 70 mg percent and diabetes with hemoglobin A1c goal below 6.5%.  I also encouraged him to continue Keppra 500 mg twice daily which seems to be tolerating well without side effects.  He also has mild cognitive impairment I recommend checking memory panel labs and increasing participation in cognitively challenging activities like solving crossword puzzles, playing bridge and sudoku.  We also discussed memory compensation strategies.  Trial of Cerefolin NAC 1 tablet daily to improve cognition.  He will return for follow-up in the future in 3 months or call earlier if necessary.  Greater than 50% time during this 45-minute consultation visit were spent on counseling and coordination of care about his episode of confusion disorientation as well as memory loss and mild cognitive impairment and answering questions  Delia Heady, MD Note: This document was prepared with digital dictation and possible smart phrase technology. Any transcriptional errors that result from this process are unintentional.

## 2022-04-25 NOTE — Patient Instructions (Signed)
I had a long discussion with the patient, his wife and daughter regarding his episode of confusion disorientation and speech difficulties likely strokelike episode versus complex partial seizure.  Recommend continue Plavix for stroke prevention and aggressive risk factor modification strict control of hypertension blood pressure goal below 140/90, lipids with LDL cholesterol goal below 70 mg percent and diabetes with hemoglobin A1c goal below 6.5%.  I also encouraged him to continue Keppra 500 mg twice daily which seems to be tolerating well without side effects.  He also has mild cognitive impairment I recommend checking memory panel labs and increasing participation in cognitively challenging activities like solving crossword puzzles, playing bridge and sudoku.  We also discussed memory compensation strategies.  Trial of Cerefolin NAC 1 tablet daily to improve cognition.  He will return for follow-up in the future in 3 months or call earlier if necessary. Memory Compensation Strategies  Use "WARM" strategy.  W= write it down  A= associate it  R= repeat it  M= make a mental note  2.   You can keep a Glass blower/designer.  Use a 3-ring notebook with sections for the following: calendar, important names and phone numbers,  medications, doctors' names/phone numbers, lists/reminders, and a section to journal what you did  each day.   3.    Use a calendar to write appointments down.  4.    Write yourself a schedule for the day.  This can be placed on the calendar or in a separate section of the Memory Notebook.  Keeping a  regular schedule can help memory.  5.    Use medication organizer with sections for each day or morning/evening pills.  You may need help loading it  6.    Keep a basket, or pegboard by the door.  Place items that you need to take out with you in the basket or on the pegboard.  You may also want to  include a message board for reminders.  7.    Use sticky notes.  Place sticky notes  with reminders in a place where the task is performed.  For example: " turn off the  stove" placed by the stove, "lock the door" placed on the door at eye level, " take your medications" on  the bathroom mirror or by the place where you normally take your medications.  8.    Use alarms/timers.  Use while cooking to remind yourself to check on food or as a reminder to take your medicine, or as a  reminder to make a call, or as a reminder to perform another task, etc.

## 2022-04-27 LAB — DEMENTIA PANEL
Homocysteine: 11.2 umol/L (ref 0.0–17.2)
RPR Ser Ql: NONREACTIVE
TSH: 3.62 u[IU]/mL (ref 0.450–4.500)
Vitamin B-12: 372 pg/mL (ref 232–1245)

## 2022-05-10 ENCOUNTER — Other Ambulatory Visit: Payer: Self-pay

## 2022-05-10 NOTE — Patient Outreach (Signed)
[facility]) Care Management  05/10/2022  Lavel Rieman [facility] 09-Sep-1956 [id number]   No telephone outreach needed as Dr. Pearlean Brownie obtained mRS on 04/25/22. MRS= 1  Vanice Sarah [facility] Care Management Assistant [phone]

## 2022-05-11 ENCOUNTER — Telehealth: Payer: Self-pay | Admitting: Neurology

## 2022-05-11 NOTE — Telephone Encounter (Signed)
Rescheduled 9/21 appt with pt's wife over the phone - MD out

## 2022-08-03 ENCOUNTER — Ambulatory Visit: Payer: Medicare HMO | Admitting: Neurology

## 2022-08-15 ENCOUNTER — Encounter: Payer: Self-pay | Admitting: Neurology

## 2022-08-15 ENCOUNTER — Ambulatory Visit: Payer: Medicare HMO | Admitting: Neurology

## 2022-08-15 VITALS — BP 157/94 | HR 58 | Ht 71.0 in | Wt 210.5 lb

## 2022-08-15 DIAGNOSIS — R2990 Unspecified symptoms and signs involving the nervous system: Secondary | ICD-10-CM | POA: Diagnosis not present

## 2022-08-15 DIAGNOSIS — G3184 Mild cognitive impairment, so stated: Secondary | ICD-10-CM

## 2022-08-15 MED ORDER — CEREFOLIN 6-1-50-5 MG PO TABS: 1.0000 | ORAL_TABLET | Freq: Every day | ORAL | 3 refills | Status: DC

## 2022-08-15 MED ORDER — LEVETIRACETAM 500 MG PO TABS
500.0000 mg | ORAL_TABLET | Freq: Two times a day (BID) | ORAL | 11 refills | Status: DC
Start: 2022-08-15 — End: 2023-08-21

## 2022-08-15 NOTE — Patient Instructions (Signed)
I had a long discussion with the patient and his wife regarding his memory loss and mild cognitive impairment which appears quite stable.  Recommend he continue Cerefolin NAC 1 tablet daily as well as increase participation in cognitively challenging activities like solving crossword puzzles, word searches, playing bridge and sudoku.  We also discussed memory compensation strategies .Continue Keppra for seizure prophylaxis the current dose of 500 mg twice daily which is tolerating well.  Continue Plavix for stroke prevention with strict control of hypertension with blood pressure goal below 140/90, lipids with LDL cholesterol goal below 70 mg percent.  Return for follow-up in the future in a year or call earlier if necessary

## 2022-08-15 NOTE — Progress Notes (Signed)
[facility] [address] [place]. [place] [address] [phone]       OFFICE FOLLOW-UP VISIT NOTE  [name] Date of Birth:  13-Mar-1956 Medical Record Number:  [id number]   Referring MD: Marvel Plan  Reason for Referral: TIA versus seizure  HPI: Initial visit 04/15/2022 :Charles Marquez is a 66 year old pleasant Caucasian male seen today for initial office consultation visit.  He is accompanied by his wife.  History is obtained from them and review of electronic medical records and I personally reviewed pertinent available imaging films in PACS.  He has past medical history for TIA, hydrocephalus s/p VP shunt, hyperlipidemia and anxiety.  He presented on 02/01/2022 with sudden onset of aphasia and speech difficulties.  NIH stroke scale was 5.  He was given IV thrombolysis with TNK.  Admitted to the ICU for close monitoring.  Blood pressure adequately controlled.  CT scan showed no acute abnormality and stable VP shunt and old right frontal infarct.  CT angiogram of the head and neck showed no large vessel stenosis or occlusion.  MRI scan of the brain showed no acute infarct though there were artifacts in the left parietal region from VP shunt.  2D echo showed ejection fraction of 60 to 65% without cardiac source of embolism.  LDL cholesterol was 76 mg percent and hemoglobin A1c was 5.3.  EEG showed focal cortical dysfunction in the left temporoparietal region but no definite epileptiform activity.  Urine drug screen was negative.  Patient was started on Keppra trial for seizure prophylaxis and since he has had several episodes of transient speech disturbance negative brain imaging.  Patient has previous episode of transient left body paresthesias in 2009.  In December 2015 he was admitted for right-sided face and finger tingling as well as difficulty speaking and garbled speech.  CT head was unremarkable MRI also did not show any acute stroke.  MRI brain and carotid Dopplers  and echocardiogram was all unremarkable.  In March 2018 he was admitted for episode of right hand numbness and difficulty speaking again again neurovascular imaging and cardiac monitoring was unremarkable.  There was a concern for A-fib on telemetry hence he had a 30-day heart monitor which was negative for A-fib subsequently had loop recorder and so far paroxysmal A-fib has not yet been found.  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 Dutch Quint.  Patient has not been working since 2019 some mild residual cognitive difficulties multiple shunt infections and surgeries.  Patient has remained on Plavix which is tolerating well with only minor bruising and no bleeding.  His blood pressure is well controlled usually and today it is 134/80.  Is also on Lipitor which is tolerating well without muscle aches and pains.  Patient is having mild short-term memory and cognitive difficulties but these appear not to be progressive.  Has not had specific cognitive evaluation for this.  On Mini-Mental status exam today scored 27/30 and clock drawing 4/4 and was able to recall 16 animals which can walk on 4 legs. Update 08/15/2022 : He returns for follow-up after last visit 4 months ago.  He is accompanied by his wife.  Patient states he is doing well.  He is no recurrent episodes of transient difficulty seizures or TIA-like episodes.  Remains on Keppra 500 mg twice daily which is tolerating well without side effects.  He is also tolerating Plavix  well without bruising or bleeding.  Blood pressure is usually well controlled at home later today in office at 157/90 he is tolerating Lipitor 40 mg well without any muscle aches or pains.  At last visit he had lab work done and vitamin B12, TSH, homocystine and RPR were all normal.  He is taking Cerefolin and seems to be tolerating it well and helping him.  He does not do regular  crossword puzzles.  Mentally changing activities.  He has not noticed any worsening of his memory difficulties. ROS:   14 system review of systems is positive for confusion, disorientation, memory difficulties, cognitive impairment and all other systems negative  PMH:  Past Medical History:  Diagnosis Date   Anxiety    Hypercholesteremia    Stroke (HCC)    tia's   TIA (transient ischemic attack)    09.15    Social History:  Social History   Socioeconomic History   Marital status: Married    Spouse name: Not on file   Number of children: 2   Years of education: MASTERS   Highest education level: Not on file  Occupational History   Not on file  Tobacco Use   Smoking status: Never   Smokeless tobacco: Never  Vaping Use   Vaping Use: Never used  Substance and Sexual Activity   Alcohol use: Yes    Alcohol/week: 0.0 standard drinks of alcohol    Comment: ocassionally   Drug use: No   Sexual activity: Not on file  Other Topics Concern   Not on file  Social History Narrative   Patient is married with 2 children.   Patient is right handed.   Patient has a Master's degree.   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   Social Determinants of Health   Financial Resource Strain: Not on file  Food Insecurity: Not on file  Transportation Needs: Not on file  Physical Activity: Not on file  Stress: Not on file  Social Connections: Not on file  Intimate Partner Violence: Not on file    Medications:   Current Outpatient Medications on File Prior to Visit  Medication Sig Dispense Refill   acetaminophen (TYLENOL) 500 MG tablet Take 2 tablets (1,000 mg total) by mouth every 8 (eight) hours as needed for moderate pain. 30 tablet 0   atorvastatin (LIPITOR) 40 MG tablet Take 1 tablet (40 mg total) by mouth daily. (Patient taking differently: Take 40 mg by mouth every evening.) 90 tablet 3   cetirizine (ZYRTEC) 10  MG tablet Take 10 mg by mouth every evening.     Cholecalciferol (VITAMIN D) 50 MCG (2000 UT) tablet Take 2,000 Units by mouth daily.     clopidogrel (PLAVIX) 75 MG tablet Take 75 mg by mouth every evening.     Multiple Vitamins-Minerals (PRESERVISION AREDS 2) CAPS Take 1 capsule by mouth 2 (two) times daily.     No current facility-administered medications on file prior to visit.    Allergies:  No Known Allergies  Physical Exam General: well developed, well nourished middle-aged Caucasian male, seated, in no evident distress Head: head normocephalic and atraumatic.   Neck: supple with no carotid or supraclavicular bruits Cardiovascular: regular rate and rhythm, no murmurs Musculoskeletal: no deformity Skin:  no rash/petichiae Vascular:  Normal pulses all extremities  Neurologic Exam Mental Status: Awake and fully alert. Oriented to place and time. Recent and remote memory intact. Attention  span, concentration and fund of knowledge appropriate. Mood and affect appropriate.  Diminished recall.  Mini-Mental status exam not done. Diminished recall 2/3.  Able to name 13 animals which can walk on 4 legs.  Clock drawing 4/4. Cranial Nerves: Fundoscopic exam not done.. Pupils equal, briskly reactive to light. Extraocular movements full without nystagmus. Visual fields full to confrontation. Hearing intact. Facial sensation intact. Face, tongue, palate moves normally and symmetrically.  Motor: Normal bulk and tone. Normal strength in all tested extremity muscles. Sensory.: intact to touch , pinprick , position and vibratory sensation.  Coordination: Rapid alternating movements normal in all extremities. Finger-to-nose and heel-to-shin performed accurately bilaterally. Gait and Station: Arises from chair without difficulty. Stance is normal. Gait demonstrates normal stride length and balance . Able to heel, toe and tandem walk without difficulty.  Reflexes: 1+ and symmetric. Toes downgoing.        ASSESSMENT: 66 year old Caucasian male with episode of confusion disorientation and speech difficulties likely strokelike episode treated with IV TNK but negative brain imaging.  He also has memory difficulties due to mild cognitive impairment.  Patient has had multiple episodes of transient speech disturbance with negative brain imaging hence trial of Keppra for seizure prophylaxis is reasonable.     PLAN:I had a long discussion with the patient and his wife regarding his memory loss and mild cognitive impairment which appears quite stable.  Recommend he continue Cerefolin NAC 1 tablet daily as well as increase participation in cognitively challenging activities like solving crossword puzzles, word searches, playing bridge and sudoku.  We also discussed memory compensation strategies .Continue Keppra for seizure prophylaxis the current dose of 500 mg twice daily which is tolerating well.  Continue Plavix for stroke prevention with strict control of hypertension with blood pressure goal below 140/90, lipids with LDL cholesterol goal below 70 mg percent.  Return for follow-up in the future in a year or call earlier if necessaryGreater than 50% time during this 35-minute  visit were spent on counseling and coordination of care about his episode of confusion disorientation as well as memory loss and mild cognitive impairment and answering questions  Charles Contras, MD Note: This document was prepared with digital dictation and possible smart phrase technology. Any transcriptional errors that result from this process are unintentional.

## 2023-01-31 ENCOUNTER — Encounter: Payer: Self-pay | Admitting: Neurology

## 2023-03-17 LAB — COLOGUARD: COLOGUARD: POSITIVE — AB

## 2023-03-17 LAB — EXTERNAL GENERIC LAB PROCEDURE: COLOGUARD: POSITIVE — AB

## 2023-08-14 ENCOUNTER — Ambulatory Visit: Payer: Medicare HMO | Admitting: Neurology

## 2023-08-14 ENCOUNTER — Encounter: Payer: Self-pay | Admitting: Neurology

## 2023-08-14 VITALS — BP 142/90 | HR 70 | Ht 71.0 in | Wt 205.2 lb

## 2023-08-14 DIAGNOSIS — G3184 Mild cognitive impairment, so stated: Secondary | ICD-10-CM | POA: Diagnosis not present

## 2023-08-14 DIAGNOSIS — Z8673 Personal history of transient ischemic attack (TIA), and cerebral infarction without residual deficits: Secondary | ICD-10-CM

## 2023-08-14 NOTE — Progress Notes (Signed)
[facility] [address] [place]. [place] [address] [phone]       OFFICE FOLLOW-UP VISIT NOTE  Mr. Troy Soldo Date of Birth:  Nov 21, 1955 Medical Record Number:  [id number]   Referring MD: Marvel Plan  Reason for Referral: TIA versus seizure  HPI: Initial visit 04/25/2022 Mr. Vandenbroek is a 67 year old pleasant Caucasian male seen today for initial office consultation visit.  He is accompanied by his wife.  History is obtained from them and review of electronic medical records and I personally reviewed pertinent available imaging films in PACS.  He has past medical history for TIA, hydrocephalus s/p VP shunt, hyperlipidemia and anxiety.  He presented on 02/01/2022 with sudden onset of aphasia and speech difficulties.  NIH stroke scale was 5.  He was given IV thrombolysis with TNK.  Admitted to the ICU for close monitoring.  Blood pressure adequately controlled.  CT scan showed no acute abnormality and stable VP shunt and old right frontal infarct.  CT angiogram of the head and neck showed no large vessel stenosis or occlusion.  MRI scan of the brain showed no acute infarct though there were artifacts in the left parietal region from VP shunt.  2D echo showed ejection fraction of 60 to 65% without cardiac source of embolism.  LDL cholesterol was 76 mg percent and hemoglobin A1c was 5.3.  EEG showed focal cortical dysfunction in the left temporoparietal region but no definite epileptiform activity.  Urine drug screen was negative.  Patient was started on Keppra trial for seizure prophylaxis and since he has had several episodes of transient speech disturbance negative brain imaging.  Patient has previous episode of transient left body paresthesias in 2009.  In December 2015 he was admitted for right-sided face and finger tingling as well as difficulty speaking and garbled speech.  CT head was unremarkable MRI also did not show any acute stroke.  MRI brain and carotid Dopplers  and echocardiogram was all unremarkable.  In March 2018 he was admitted for episode of right hand numbness and difficulty speaking again again neurovascular imaging and cardiac monitoring was unremarkable.  There was a concern for A-fib on telemetry hence he had a 30-day heart monitor which was negative for A-fib subsequently had loop recorder and so far paroxysmal A-fib has not yet been found.  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 Dutch Quint.  Patient has not been working since 2019 some mild residual cognitive difficulties multiple shunt infections and surgeries.  Patient has remained on Plavix which is tolerating well with only minor bruising and no bleeding.  His blood pressure is well controlled usually and today it is 134/80.  Is also on Lipitor which is tolerating well without muscle aches and pains.  Patient is having mild short-term memory and cognitive difficulties but these appear not to be progressive.  Has not had specific cognitive evaluation for this.  On Mini-Mental status exam today scored 27/30 and clock drawing 4/4 and was able to recall 16 animals which can walk on 4 legs. Update 08/14/2023 : He returns for follow-up after last visit in June 2023.  He is accompanied by his wife.  He states he is doing well.  He has had no further episodes of transient speech difficulties or confusion since starting Keppra.  He remains on 5 mg twice daily which is tolerating well without any side effects.  He  is tolerating Plavix well without bruising or bleeding.  He has had no recurrent stroke or TIA symptoms.  He also states his memory loss and cognitive difficulties appear unchanged.  He does do daily puzzles solving as well as verbal.  He does take Cerefolin NAC daily as well.  Fully independent in all activitiess of daily living.  He is quite active and exercise regularly.  He has no new  complaints. ROS:   14 system review of systems is positive for confusion, disorientation, memory difficulties, cognitive impairment and all other systems negative  PMH:  Past Medical History:  Diagnosis Date   Anxiety    Hypercholesteremia    Stroke (HCC)    tia's   TIA (transient ischemic attack)    09.15    Social History:  Social History   Socioeconomic History   Marital status: Married    Spouse name: Not on file   Number of children: 2   Years of education: MASTERS   Highest education level: Not on file  Occupational History   Not on file  Tobacco Use   Smoking status: Never   Smokeless tobacco: Never  Vaping Use   Vaping status: Never Used  Substance and Sexual Activity   Alcohol use: Yes    Alcohol/week: 0.0 standard drinks of alcohol    Comment: ocassionally   Drug use: No   Sexual activity: Not on file  Other Topics Concern   Not on file  Social History Narrative   Patient is married with 2 children.   Patient is right handed.   Patient has a Master's degree.   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   Social Determinants of Health   Financial Resource Strain: Low Risk  (02/28/2023)   Received from [facility]   Overall Financial Resource Strain (CARDIA)    Difficulty of Paying Living Expenses: Not hard at all  Food Insecurity: No Food Insecurity (02/28/2023)   Received from [facility]   Hunger Vital Sign    Worried About Running Out of Food in the Last Year: Never true    Ran Out of Food in the Last Year: Never true  Transportation Needs: No Transportation Needs (02/28/2023)   Received from [facility] - Transportation    Lack of Transportation (Medical): No    Lack of Transportation (Non-Medical): No  Physical Activity: Not on file  Stress: Not on file  Social Connections: Unknown (03/16/2022)   Received from [facility], [facility]   Social Network     Social Network: Not on file  Intimate Partner Violence: Unknown (02/14/2022)   Received from [facility], [facility]   HITS    Physically Hurt: Not on file    Insult or Talk Down To: Not on file    Threaten Physical Harm: Not on file    Scream or Curse: Not on file    Medications:   Current Outpatient Medications on File Prior to Visit  Medication Sig Dispense Refill   acetaminophen (TYLENOL) 500 MG tablet Take 2 tablets (1,000 mg total) by mouth every 8 (eight) hours as needed for moderate pain. 30 tablet 0   atorvastatin (LIPITOR) 40 MG tablet Take 1 tablet (40 mg total) by mouth daily. (Patient taking differently: Take 40 mg by mouth every evening.) 90 tablet 3   cetirizine (ZYRTEC) 10 MG tablet Take 10 mg by mouth every evening.  Cholecalciferol (VITAMIN D) 50 MCG (2000 UT) tablet Take 2,000 Units by mouth daily.     clopidogrel (PLAVIX) 75 MG tablet Take 75 mg by mouth every evening.     L-Methylfolate-B12-B6-B2 (CEREFOLIN) 04-13-49-5 MG TABS Take 1 capsule by mouth daily. 90 tablet 3   levETIRAcetam (KEPPRA) 500 MG tablet Take 1 tablet (500 mg total) by mouth 2 (two) times daily. 60 tablet 11   Multiple Vitamins-Minerals (PRESERVISION AREDS 2) CAPS Take 1 capsule by mouth 2 (two) times daily.     No current facility-administered medications on file prior to visit.    Allergies:  No Known Allergies  Physical Exam General: well developed, well nourished middle-aged Caucasian male, seated, in no evident distress Head: head normocephalic and atraumatic.   Neck: supple with no carotid or supraclavicular bruits Cardiovascular: regular rate and rhythm, no murmurs Musculoskeletal: no deformity Skin:  no rash/petichiae Vascular:  Normal pulses all extremities  Neurologic Exam Mental Status: Awake and fully alert. Oriented to place and time. Recent and remote memory intact. Attention span, concentration and fund of knowledge appropriate. Mood and affect appropriate.  Diminished  recall.  Mini-Mental status exam score 30/30 with no deficits.  Cranial Nerves: Fundoscopic exam reveals sharp disc margins. Pupils equal, briskly reactive to light. Extraocular movements full without nystagmus. Visual fields full to confrontation. Hearing intact. Facial sensation intact. Face, tongue, palate moves normally and symmetrically.  Motor: Normal bulk and tone. Normal strength in all tested extremity muscles. Sensory.: intact to touch , pinprick , position and vibratory sensation.  Coordination: Rapid alternating movements normal in all extremities. Finger-to-nose and heel-to-shin performed accurately bilaterally. Gait and Station: Arises from chair without difficulty. Stance is normal. Gait demonstrates normal stride length and balance . Able to heel, toe and tandem walk without difficulty.  Reflexes: 1+ and symmetric. Toes downgoing.       08/14/2023    2:57 PM 04/25/2022    9:04 AM  MMSE - Mini Mental State Exam  Orientation to time 5 4  Orientation to Place 5 5  Registration 3 3  Attention/ Calculation 5 4  Recall 3 2  Language- name 2 objects 2 2  Language- repeat 1 1  Language- follow 3 step command 3 3  Language- read & follow direction 1 1  Write a sentence 1 1  Copy design 1 1  Total score 30 27      ASSESSMENT: 67 year old Caucasian male with episode of confusion disorientation and speech difficulties likely strokelike episode treated with IV TNK but negative brain imaging.  He also has memory difficulties due to mild cognitive impairment which appears stable.  Patient has had multiple episodes of transient speech disturbance with negative brain imaging hence trial of Keppra for seizure prophylaxis is reasonable.     PLAN:I had a long discussion with the patient, his wife regarding his episode of confusion disorientation and speech difficulties likely strokelike episode versus complex partial seizure.  He is doing well on Keppra without any breakthrough episodes.   Recommend continue Plavix for stroke prevention and aggressive risk factor modification strict control of hypertension blood pressure goal below 140/90, lipids with LDL cholesterol goal below 70 mg percent and diabetes with hemoglobin A1c goal below 6.5%.  I also encouraged him to continue Keppra 500 mg twice daily which seems to be tolerating well without side effects.  He also has mild cognitive impairment I recommend continue participation in cognitively challenging activities like solving crossword puzzles, playing bridge and sudoku.  We also  discussed memory compensation strategies. Continue cerefolin nac for cognitive impairment. [name]  He will return for follow-up in the future in 1 year and if having no further episodes we will start tapering Keppra at that visit.  Or call earlier if necessary.  Greater than 50% time during this 45-minute consultation visit were spent on counseling and coordination of care about his episode of confusion disorientation as well as memory loss and mild cognitive impairment and answering questions  Delia Heady, MD Note: This document was prepared with digital dictation and possible smart phrase technology. Any transcriptional errors that result from this process are unintentional.

## 2023-08-14 NOTE — Patient Instructions (Addendum)
I had a long discussion with the patient, his wife regarding his episode of confusion disorientation and speech difficulties likely strokelike episode versus complex partial seizure.  He is doing well on Keppra without any breakthrough episodes.  Recommend continue Plavix for stroke prevention and aggressive risk factor modification strict control of hypertension blood pressure goal below 140/90, lipids with LDL cholesterol goal below 70 mg percent and diabetes with hemoglobin A1c goal below 6.5%.  I also encouraged him to continue Keppra 500 mg twice daily which seems to be tolerating well without side effects.  He also has mild cognitive impairment I recommend continue participation in cognitively challenging activities like solving crossword puzzles, playing bridge and sudoku.  We also discussed memory compensation strategies.  [name]  He will return for follow-up in the future in 1 year and if having no further episodes we will start tapering Keppra at that visit.  Or call earlier if necessary.

## 2023-08-21 ENCOUNTER — Other Ambulatory Visit: Payer: Self-pay | Admitting: Neurology

## 2023-08-29 ENCOUNTER — Ambulatory Visit: Payer: Medicare HMO | Admitting: Neurology

## 2023-09-18 ENCOUNTER — Encounter (HOSPITAL_COMMUNITY): Payer: Self-pay

## 2023-09-18 ENCOUNTER — Observation Stay (HOSPITAL_COMMUNITY)
Admission: EM | Admit: 2023-09-18 | Discharge: 2023-09-19 | Disposition: A | Discharge status: Home / Self Care | Payer: Medicare HMO | Attending: Internal Medicine | Admitting: Internal Medicine

## 2023-09-18 ENCOUNTER — Other Ambulatory Visit: Payer: Self-pay

## 2023-09-18 ENCOUNTER — Emergency Department (HOSPITAL_COMMUNITY): Payer: Medicare HMO

## 2023-09-18 DIAGNOSIS — G459 Transient cerebral ischemic attack, unspecified: Principal | ICD-10-CM

## 2023-09-18 DIAGNOSIS — R4701 Aphasia: Secondary | ICD-10-CM

## 2023-09-18 DIAGNOSIS — R2689 Other abnormalities of gait and mobility: Secondary | ICD-10-CM | POA: Diagnosis not present

## 2023-09-18 DIAGNOSIS — Z982 Presence of cerebrospinal fluid drainage device: Secondary | ICD-10-CM | POA: Diagnosis not present

## 2023-09-18 DIAGNOSIS — Z7901 Long term (current) use of anticoagulants: Secondary | ICD-10-CM | POA: Insufficient documentation

## 2023-09-18 DIAGNOSIS — E785 Hyperlipidemia, unspecified: Secondary | ICD-10-CM | POA: Insufficient documentation

## 2023-09-18 DIAGNOSIS — G919 Hydrocephalus, unspecified: Secondary | ICD-10-CM | POA: Diagnosis not present

## 2023-09-18 DIAGNOSIS — G40909 Epilepsy, unspecified, not intractable, without status epilepticus: Secondary | ICD-10-CM | POA: Insufficient documentation

## 2023-09-18 DIAGNOSIS — Z79899 Other long term (current) drug therapy: Secondary | ICD-10-CM | POA: Insufficient documentation

## 2023-09-18 DIAGNOSIS — F1090 Alcohol use, unspecified, uncomplicated: Secondary | ICD-10-CM | POA: Insufficient documentation

## 2023-09-18 DIAGNOSIS — R2681 Unsteadiness on feet: Secondary | ICD-10-CM | POA: Insufficient documentation

## 2023-09-18 LAB — CBC WITH DIFFERENTIAL/PLATELET
Abs Immature Granulocytes: 0.01 10*3/uL (ref 0.00–0.07)
Basophils Absolute: 0 10*3/uL (ref 0.0–0.1)
Basophils Relative: 1 %
Eosinophils Absolute: 0.1 10*3/uL (ref 0.0–0.5)
Eosinophils Relative: 2 %
HCT: 39.1 % (ref 39.0–52.0)
Hemoglobin: 13 g/dL (ref 13.0–17.0)
Immature Granulocytes: 0 %
Lymphocytes Relative: 25 %
Lymphs Abs: 1.5 10*3/uL (ref 0.7–4.0)
MCH: 31.5 pg (ref 26.0–34.0)
MCHC: 33.2 g/dL (ref 30.0–36.0)
MCV: 94.7 fL (ref 80.0–100.0)
Monocytes Absolute: 0.8 10*3/uL (ref 0.1–1.0)
Monocytes Relative: 13 %
Neutro Abs: 3.5 10*3/uL (ref 1.7–7.7)
Neutrophils Relative %: 59 %
Platelets: 178 10*3/uL (ref 150–400)
RBC: 4.13 MIL/uL — ABNORMAL LOW (ref 4.22–5.81)
RDW: 12.4 % (ref 11.5–15.5)
WBC: 6 10*3/uL (ref 4.0–10.5)
nRBC: 0 % (ref 0.0–0.2)

## 2023-09-18 LAB — CBC
HCT: 41.5 % (ref 39.0–52.0)
Hemoglobin: 13.7 g/dL (ref 13.0–17.0)
MCH: 31.6 pg (ref 26.0–34.0)
MCHC: 33 g/dL (ref 30.0–36.0)
MCV: 95.8 fL (ref 80.0–100.0)
Platelets: 184 10*3/uL (ref 150–400)
RBC: 4.33 MIL/uL (ref 4.22–5.81)
RDW: 12.5 % (ref 11.5–15.5)
WBC: 6.6 10*3/uL (ref 4.0–10.5)
nRBC: 0 % (ref 0.0–0.2)

## 2023-09-18 LAB — PROTIME-INR
INR: 1.1 (ref 0.8–1.2)
Prothrombin Time: 14.5 s (ref 11.4–15.2)

## 2023-09-18 LAB — APTT: aPTT: 29 s (ref 24–36)

## 2023-09-18 LAB — BASIC METABOLIC PANEL
Anion gap: 8 (ref 5–15)
BUN: 16 mg/dL (ref 8–23)
CO2: 27 mmol/L (ref 22–32)
Calcium: 8.9 mg/dL (ref 8.9–10.3)
Chloride: 103 mmol/L (ref 98–111)
Creatinine, Ser: 0.95 mg/dL (ref 0.61–1.24)
GFR, Estimated: >60 mL/min (ref >60)
Glucose, Bld: 95 mg/dL (ref 70–99)
Potassium: 4.2 mmol/L (ref 3.5–5.1)
Sodium: 138 mmol/L (ref 135–145)

## 2023-09-18 LAB — ETHANOL: Alcohol, Ethyl (B): <10 mg/dL (ref <10)

## 2023-09-18 MED ORDER — SODIUM CHLORIDE 0.9% FLUSH
3.0000 mL | Freq: Once | INTRAVENOUS | Status: AC
  Administered 2023-09-18: 3 mL via INTRAVENOUS

## 2023-09-18 NOTE — ED Notes (Signed)
ED Provider at bedside. 

## 2023-09-18 NOTE — ED Provider Notes (Signed)
[facility] Emergency Department Provider Note MRN:  [id number]  Arrival date & time: 09/19/23     Chief Complaint   Aphasia   History of Present Illness   Charles Marquez is a 67 y.o. year-old male presents to the ED with chief complaint of aphasia that started tonight at around 7:30pm.  Symptoms lasted about 30 minutes.  Symptoms have now resolved.[name]  Hx of hydrocephalus, VA shunt, multiple prior instances of meningitis.  He denies fever or recent illness.  Denies any symptoms now.  Denies any seizure like activity.  Takes plavix, keppra, and statin.  History provided by patient.   Review of Systems  Pertinent positive and negative review of systems noted in HPI.    Physical Exam   Vitals:   09/18/23 2103  BP: (!) 174/100  Pulse: 67  Resp: 18  Temp: 97.8 F (36.6 C)  SpO2: 100%    CONSTITUTIONAL:  well-appearing, NAD NEURO:  Alert and oriented x 3, CN 3-12 grossly intact, normal finger to nose, no pronator drift, ambulates without difficulty EYES:  eyes equal and reactive ENT/NECK:  Supple, no stridor  CARDIO:  normal rate, regular rhythm, appears well-perfused  PULM:  No respiratory distress, CTAB GI/GU:  non-distended,  MSK/SPINE:  No gross deformities, no edema, moves all extremities  SKIN:  no rash, atraumatic   *Additional and/or pertinent findings included in MDM below  Diagnostic and Interventional Summary    EKG Interpretation Date/Time:  Tuesday September 18 2023 21:36:08 EST Ventricular Rate:  63 PR Interval:  148 QRS Duration:  78 QT Interval:  406 QTC Calculation: 415 R Axis:   -18  Text Interpretation: Normal sinus rhythm Nonspecific ST and T wave abnormality No significant change since last tracing When compared with ECG of 01-Feb-2022 17:08, PREVIOUS ECG IS PRESENT Confirmed by Gwyneth Sprout ([id number]) on 09/18/2023 10:59:23 PM       Labs Reviewed  CBC WITH DIFFERENTIAL/PLATELET - Abnormal; Notable for the  following components:      Result Value   RBC 4.13 (*)    All other components within normal limits  BASIC METABOLIC PANEL  CBC  PROTIME-INR  APTT  ETHANOL  HEPATIC FUNCTION PANEL  LIPID PANEL  HEMOGLOBIN A1C  CBG MONITORING, ED    CT Head Wo Contrast  Final Result    [name] CONTRAST    (Results Pending)  CT ANGIO HEAD NECK W WO CM    (Results Pending)    Medications  aspirin EC tablet 81 mg (has no administration in time range)  clopidogrel (PLAVIX) tablet 75 mg (has no administration in time range)  levETIRAcetam (KEPPRA) tablet 500 mg (has no administration in time range)  acetaminophen (TYLENOL) tablet 650 mg (has no administration in time range)    Or  acetaminophen (TYLENOL) suppository 650 mg (has no administration in time range)   stroke: early stages of recovery book (has no administration in time range)  sodium chloride flush (NS) 0.9 % injection 3 mL (3 mLs Intravenous Given 09/18/23 2231)     Procedures  /  Critical Care Procedures  ED Course and Medical Decision Making  I have reviewed the triage vital signs, the nursing notes, and pertinent available records from the EMR.  Social Determinants Affecting Complexity of Care: Patient has no clinically significant social determinants affecting this chief complaint..   ED Course: Clinical Course as of 09/19/23 0532  Tue Sep 18, 2023  2350 I consulted with Dr. Derry Lory, who will see  the patient.  Awaiting recs.  [RB]  Wed Sep 19, 2023  0530 CT Head Wo Contrast No obvious bleed, shunt in place [RB]  0531 CBC with Differential/Platelet(!) No leukocytosis or anemia [RB]  0531 Basic metabolic panel No significant electrolyte derangement [RB]    Clinical Course User Index [RB] Roxy Horseman, PA-C    Medical Decision Making Patient here with episode of aphasia that lasted about 30 minutes around 7:30pm last night.  Symptoms have completely resolved.  Code stroke NOT activated due to resolution of  symptoms.  Workup in progress.    Amount and/or Complexity of Data Reviewed Labs: ordered. Decision-making details documented in ED Course. Radiology: ordered and independent interpretation performed.    Details: Shunt in place, no obvious bleed  Risk Decision regarding hospitalization.         Consultants: I consulted with Hospitalist, Dr. Arlean Hopping, who is appreciated for admitting. I also consulted with Dr. Derry Lory, who recommends admission for stroke workup.  Treatment and Plan: Patient's exam and diagnostic results are concerning for TIA.  Feel that patient will need admission to the hospital for further treatment and evaluation.    Final Clinical Impressions(s) / ED Diagnoses     ICD-10-CM   1. TIA (transient ischemic attack)  G45.9     2. Aphasia  R47.01       ED Discharge Orders     None         Discharge Instructions Discussed with and Provided to Patient:   Discharge Instructions   None      Roxy Horseman, PA-C 09/19/23 0533    Tilden Fossa, MD 09/19/23 [phone]

## 2023-09-18 NOTE — ED Triage Notes (Signed)
Arrives [facility] from home after sudden manifestation of right sided facial droop and expressive aphasia beginning ~630-7PM.   Says he was watching a movie with significant other and was unable to find the words to describe it. Symptoms lasted 20-30 minutes.   Upon arrival NIH -0.   Says he has history of TIA, hydrocephalus with previous VP shunts. Pt / spouse wanted to seek evaluation to make sure everything was medically safe.

## 2023-09-18 NOTE — ED Notes (Signed)
Patient transported to CT 

## 2023-09-19 ENCOUNTER — Observation Stay (HOSPITAL_COMMUNITY): Payer: Medicare HMO

## 2023-09-19 ENCOUNTER — Emergency Department (HOSPITAL_COMMUNITY): Payer: Medicare HMO

## 2023-09-19 DIAGNOSIS — R4701 Aphasia: Secondary | ICD-10-CM | POA: Diagnosis not present

## 2023-09-19 DIAGNOSIS — R569 Unspecified convulsions: Secondary | ICD-10-CM

## 2023-09-19 DIAGNOSIS — G459 Transient cerebral ischemic attack, unspecified: Secondary | ICD-10-CM

## 2023-09-19 LAB — HEMOGLOBIN A1C
Hgb A1c MFr Bld: 5.2 % (ref 4.8–5.6)
Mean Plasma Glucose: 102.54 mg/dL

## 2023-09-19 LAB — HEPATIC FUNCTION PANEL
ALT: 27 U/L (ref 0–44)
AST: 34 U/L (ref 15–41)
Albumin: 3.6 g/dL (ref 3.5–5.0)
Alkaline Phosphatase: 39 U/L (ref 38–126)
Bilirubin, Direct: 0.2 mg/dL (ref 0.0–0.2)
Indirect Bilirubin: 0.7 mg/dL (ref 0.3–0.9)
Total Bilirubin: 0.9 mg/dL (ref <1.2)
Total Protein: 6.6 g/dL (ref 6.5–8.1)

## 2023-09-19 LAB — CBG MONITORING, ED: Glucose-Capillary: 92 mg/dL (ref 70–99)

## 2023-09-19 LAB — LIPID PANEL
Cholesterol: 134 mg/dL (ref 0–200)
HDL: 39 mg/dL — ABNORMAL LOW (ref >40)
LDL Cholesterol: 78 mg/dL (ref 0–99)
Total CHOL/HDL Ratio: 3.4 {ratio}
Triglycerides: 83 mg/dL (ref <150)
VLDL: 17 mg/dL (ref 0–40)

## 2023-09-19 MED ORDER — STROKE: EARLY STAGES OF RECOVERY BOOK
Freq: Once | Status: AC
  Filled 2023-09-19: qty 1

## 2023-09-19 MED ORDER — LEVETIRACETAM 500 MG PO TABS: 500.0000 mg | ORAL_TABLET | Freq: Two times a day (BID) | ORAL | Status: DC

## 2023-09-19 MED ORDER — HYDRALAZINE HCL 20 MG/ML IJ SOLN: 10.0000 mg | Freq: Four times a day (QID) | INTRAMUSCULAR | Status: DC | PRN

## 2023-09-19 MED ORDER — ACETAMINOPHEN 325 MG PO TABS: 650.0000 mg | ORAL_TABLET | Freq: Four times a day (QID) | ORAL | Status: DC | PRN

## 2023-09-19 MED ORDER — ONDANSETRON HCL 4 MG/2ML IJ SOLN
4.0000 mg | Freq: Four times a day (QID) | INTRAMUSCULAR | Status: DC | PRN
Start: 2023-09-19 — End: 2023-09-19

## 2023-09-19 MED ORDER — ASPIRIN 81 MG PO TBEC: 81.0000 mg | DELAYED_RELEASE_TABLET | Freq: Every day | ORAL | 0 refills | Status: AC

## 2023-09-19 MED ORDER — ACETAMINOPHEN 650 MG RE SUPP: 650.0000 mg | Freq: Four times a day (QID) | RECTAL | Status: DC | PRN

## 2023-09-19 MED ORDER — ENOXAPARIN SODIUM 40 MG/0.4ML IJ SOSY
40.0000 mg | PREFILLED_SYRINGE | Freq: Every day | INTRAMUSCULAR | Status: DC
  Administered 2023-09-19: 40 mg via SUBCUTANEOUS
  Filled 2023-09-19: qty 0.4

## 2023-09-19 MED ORDER — CLOPIDOGREL BISULFATE 75 MG PO TABS: 75.0000 mg | ORAL_TABLET | Freq: Every evening | ORAL | Status: AC

## 2023-09-19 MED ORDER — ASPIRIN 81 MG PO TBEC
81.0000 mg | DELAYED_RELEASE_TABLET | Freq: Every day | ORAL | Status: DC
  Administered 2023-09-19: 81 mg via ORAL
  Filled 2023-09-19: qty 1

## 2023-09-19 MED ORDER — CLOPIDOGREL BISULFATE 75 MG PO TABS
75.0000 mg | ORAL_TABLET | Freq: Every day | ORAL | Status: DC
  Administered 2023-09-19: 75 mg via ORAL
  Filled 2023-09-19: qty 1

## 2023-09-19 MED ORDER — LEVETIRACETAM 750 MG PO TABS: 750.0000 mg | ORAL_TABLET | Freq: Two times a day (BID) | ORAL | 0 refills | Status: DC

## 2023-09-19 MED ORDER — ATORVASTATIN CALCIUM 40 MG PO TABS: 40.0000 mg | ORAL_TABLET | Freq: Every evening | ORAL | Status: DC

## 2023-09-19 MED ORDER — ONDANSETRON HCL 4 MG PO TABS: 4.0000 mg | ORAL_TABLET | Freq: Four times a day (QID) | ORAL | Status: DC | PRN

## 2023-09-19 MED ORDER — LEVETIRACETAM 500 MG PO TABS
750.0000 mg | ORAL_TABLET | Freq: Two times a day (BID) | ORAL | Status: DC
  Administered 2023-09-19: 750 mg via ORAL
  Filled 2023-09-19: qty 1

## 2023-09-19 MED ORDER — IOHEXOL 350 MG/ML SOLN
75.0000 mL | Freq: Once | INTRAVENOUS | Status: AC | PRN
  Administered 2023-09-19: 75 mL via INTRAVENOUS

## 2023-09-19 MED ORDER — ALBUTEROL SULFATE (2.5 MG/3ML) 0.083% IN NEBU
2.5000 mg | INHALATION_SOLUTION | Freq: Four times a day (QID) | RESPIRATORY_TRACT | Status: DC | PRN
Start: 2023-09-19 — End: 2023-09-19

## 2023-09-19 NOTE — Consult Note (Addendum)
STROKE TEAM PROGRESS NOTE   BRIEF HPI [name] is a 67 y.o. male with history of HLD, strokes, prior hydrocephalous s/p shnt and complicated prior episodes of meningitis, presenting with word finding difficulty and right sided facial asymmetry. [name]   SIGNIFICANT HOSPITAL EVENTS 11/5 CT negative for acute ischemic event  11/6 MRI final read pending, negative for ischemic event after personal review   INTERIM HISTORY/SUBJECTIVE Patient wife bedside for support and contributive to history. Reports patient had a 20 minute spell of word finding difficulty and right sided facial droop. Reports his mind was clear, denies headache, twitching, jerking movements. Patient has been compliant on Keppra daily and this is the first occurrence since starting on Keppra in March. Prior to keppra, he experienced this symptoms 5 other times ranging back to 2009,2015,2018.  All these episodes were quite stereotypical and the latest episode was actually the mildest.  He is at negative neurovascular workup in the past.  With CT angiogram showing only mild atherosclerotic changes.  MRI scan of the brain today as well as during similar episode in March 2023 were both negative for acute infarct.  Has previously had cardiac work-up in the past with heart monitor and loop recorder. Discontinued due to never finding any arrhythmias.   OBJECTIVE  CBC    Component Value Date/Time   WBC 6.0 09/18/2023 2240   RBC 4.13 (L) 09/18/2023 2240   HGB 13.0 09/18/2023 2240   HCT 39.1 09/18/2023 2240   PLT 178 09/18/2023 2240   MCV 94.7 09/18/2023 2240   MCH 31.5 09/18/2023 2240   MCHC 33.2 09/18/2023 2240   RDW 12.4 09/18/2023 2240   LYMPHSABS 1.5 09/18/2023 2240   MONOABS 0.8 09/18/2023 2240   EOSABS 0.1 09/18/2023 2240   BASOSABS 0.0 09/18/2023 2240    BMET    Component Value Date/Time   NA 138 09/18/2023 2135   K 4.2 09/18/2023 2135   CL 103 09/18/2023 2135   CO2 27 09/18/2023 2135   GLUCOSE 95  09/18/2023 2135   BUN 16 09/18/2023 2135   CREATININE 0.95 09/18/2023 2135   CALCIUM 8.9 09/18/2023 2135   GFRNONAA >60 09/18/2023 2135    IMAGING past 24 hours CT ANGIO HEAD NECK W WO CM  Result Date: 09/19/2023 CLINICAL DATA:  67 year old male with a history of ventricular shunt. Aphasia and facial droop onset yesterday. EXAM: CT ANGIOGRAPHY HEAD AND NECK WITH AND WITHOUT CONTRAST TECHNIQUE: Multidetector CT imaging of the head and neck was performed using the standard protocol during bolus administration of intravenous contrast. Multiplanar CT image reconstructions and MIPs were obtained to evaluate the vascular anatomy. Carotid stenosis measurements (when applicable) are obtained utilizing NASCET criteria, using the distal internal carotid diameter as the denominator. RADIATION DOSE REDUCTION: This exam was performed according to the departmental dose-optimization program which includes automated exposure control, adjustment of the mA and/or kV according to patient size and/or use of iterative reconstruction technique. CONTRAST:  75mL OMNIPAQUE IOHEXOL 350 MG/ML SOLN COMPARISON:  Head CT 2131 hours yesterday. Prior brain MRI 02/02/2022, prior CTA head and neck 02/01/2022. FINDINGS: CT HEAD Brain: Left posterior approach ventriculostomy shunt is stable. No ventriculomegaly. Chronic cerebral white matter disease and multifocal cortical encephalomalacia, which might be related to previous shunt tracks, stable. Chronic posterior limb internal capsule and thalamic heterogeneity on the left is stable. No acute intracranial hemorrhage identified. No intracranial mass effect or midline shift. No acute cortically based infarct identified. Calvarium and skull base: Chronic right hemisphere burr  holes. Left posterior burr hole. No acute osseous abnormality identified. Paranasal sinuses: Visualized paranasal sinuses and mastoids are stable and well aerated. Orbits: No gaze deviation. Stable left scalp convexity  shunt reservoir and tubing. CTA NECK Skeleton: No acute osseous abnormality identified. Upper chest: The left side shunt tubing tracks into the left internal jugular vein and to the confluence of the innominate veins and SVC as before. Negative visible upper lungs and superior mediastinum. Other neck: Stable from last year, no acute finding. Aortic arch: Tortuous aortic arch with mild calcified atherosclerosis. Three vessel arch configuration. Right carotid system: Mildly tortuous brachiocephalic artery and right CCA origin with no plaque or stenosis. Soft and calcified plaque at the right ICA origin, proximal bulb, with less than 50 % stenosis with respect to the distal vessel has not significantly changed. Tortuous right ICA just below the skull base. Left carotid system: Minimal plaque at the left CCA origin without stenosis. Mild calcified plaque at the left ICA origin without stenosis. Vertebral arteries: Soft and calcified plaque at the right subclavian artery origin is stable without stenosis. Right vertebral origin is normal. Right vertebral artery appears dominant, patent to the skull base with no plaque or stenosis. Proximal left subclavian artery atherosclerosis is stable and mild. Left vertebral artery origin remains normal. Tortuous left V1 segment. Non dominant left vertebral artery is stable, patent to the skull base with no plaque or stenosis. CTA HEAD Posterior circulation: Distal vertebral arteries and vertebrobasilar junction are patent with dominant right V4. Mild calcified plaque at the vertebrobasilar junction without stenosis. Normal left PICA and dominant appearing right AICA origins. Patent basilar artery with mild irregularity but no significant stenosis. Patent SCA and right PCA origins. Fetal type left PCA origin again noted. Right posterior communicating artery diminutive or absent. Left PCA branches are stable and within normal limits. Right PCA branches are stable and within normal  limits. Anterior circulation: Both ICA siphons are patent. Normal left posterior communicating artery and no significant left siphon plaque or stenosis. Minimal right siphon calcified plaque without stenosis. Patent carotid termini. Patent MCA and ACA origins. Dominant right A1. Anterior communicating artery and bilateral ACA branches are stable and within normal limits. Left MCA M1 segment and bifurcation are mildly ectatic without stenosis. Mild calcified plaque at the right MCA origin, no stenosis. Mildly ectatic right M1 and bifurcation without stenosis. Bilateral MCA branches are stable and within normal limits. Venous sinuses: Early contrast timing, grossly patent. Anatomic variants: Dominant right vertebral artery. Fetal type left PCA origin. Dominant right ACA A1. Review of the MIP images confirms the above findings IMPRESSION: 1. No large vessel occlusion. Stable CTA Head and Neck since last year: Generally mild atherosclerosis in the head and neck. No hemodynamically significant stenosis identified. 2. Stable CT appearance of the brain. No acute intracranial abnormality. 3.  Aortic Atherosclerosis (ICD10-I70.0). Electronically Signed   By: Odessa Fleming M.D.   On: 09/19/2023 05:59   CT Head Wo Contrast  Result Date: 09/18/2023 CLINICAL DATA:  Right facial droop and expressive aphasia since 6:30 p.m. EXAM: CT HEAD WITHOUT CONTRAST TECHNIQUE: Contiguous axial images were obtained from the base of the skull through the vertex without intravenous contrast. RADIATION DOSE REDUCTION: This exam was performed according to the departmental dose-optimization program which includes automated exposure control, adjustment of the mA and/or kV according to patient size and/or use of iterative reconstruction technique. COMPARISON:  02/02/2022, 02/01/2022 FINDINGS: Brain: Stable ventriculostomy catheter via left parietal approach, tip in the region of  the right thalamus. Stable right frontal encephalomalacia likely from  prior catheter placement. Chronic hypodensities are again seen throughout the periventricular white matter, which may reflect chronic small vessel ischemic change. No evidence of acute infarct or hemorrhage. Lateral ventricles and midline structures are stable. No acute extra-axial fluid collections. No mass effect. Vascular: No hyperdense vessel or unexpected calcification. Stable atherosclerosis. Skull: Stable postsurgical changes are seen within the right frontal and bilateral parietal regions of the calvarium. No acute or destructive bony abnormality. Sinuses/Orbits: No acute finding. Other: None. IMPRESSION: 1. No acute intracranial process. 2. Stable ventriculostomy catheter.  No evidence of hydrocephalus. Electronically Signed   By: Sharlet Salina M.D.   On: 09/18/2023 22:34    Vitals:   09/19/23 0734 09/19/23 0800 09/19/23 0830 09/19/23 0940  BP:  (!) 152/97 (!) 135/98   Pulse:  62 60   Resp:  14 17   Temp:    98 F (36.7 C)  TempSrc:    Oral  SpO2: 95% 91% 96%   Weight:      Height:        PHYSICAL EXAM General:  Alert, well-nourished, well-developed patient in no acute distress Psych:  Mood and affect appropriate for situation CV: Regular rate and rhythm on monitor Respiratory:  Regular, unlabored respirations on room air GI: Abdomen soft and nontender   NEURO:  Mental Status: AA&Ox3, patient is able to give clear and coherent history Speech/Language: speech is without dysarthria or aphasia.  Naming, repetition, fluency, and comprehension intact.  Cranial Nerves:  II: PERRL. Visual fields full.  III, IV, VI: EOMI. Eyelids elevate symmetrically.  V: Sensation is intact to light touch and symmetrical to face.  VII: Face is symmetrical resting and smiling VIII: hearing intact to voice. IX, X: Palate elevates symmetrically. Phonation is normal.  YN:WGNFAOZH shrug 5/5. XII: tongue is midline without fasciculations. Motor: 5/5 strength to all muscle groups tested.  Tone: is  normal and bulk is normal Sensation- Intact to light touch bilaterally. Coordination: FTN intact bilaterally, No drift in extremities.   Gait- deferred  ASSESSMENT/PLAN  Transient episode of expressive aphasia etiology indeterminate -simple partial seizure vs left hemispheric TIA  Etiology:  Less suspicious for an acute stroke given reassuring CT and MRI without an acute ischemic event. Suspect, this could be related to his partial seizures. This is his first episode of confusion, disorientation and speech difficulties since March. Recently evaluated in October 2024 and has been compliant on keppra 500 mg BID. Will increase home keppra to 750 mg BID. Has mild cognitive impairment and family history of Alzheimer's. Will send off, APOE lab and follow-up results in outpatient setting and also consider checking PET amyloid at follow-up for Alzheimer's CT head No acute abnormality, stable right frontal encephalomalacia likely from catheter placement CTA head & neck No LVO, mild atherosclerosis in the head and neck, no acute abnormalities,  MRI   no acute infarct or abnormality.  And encephalomalacia and gliosis in the right frontal and bilateral parietal region from prior hemorrhage and VP shunt  EEG pending,  Miscellaneous [facility] lab sent off, for APOE gene testing, follow-up outpatient  LDL 76 HgbA1c 5.2 VTE prophylaxis - Lovenox aspirin 81 mg daily prior to admission, now on aspirin 81 mg daily and clopidogrel 75 mg daily for 3 weeks and then Plavix alone. Therapy recommendations:  Pending Disposition:  Pending evaluation with PT/OT, anticipate home  Patient stable to leave from a neurological standpoint once complete  Follow-up with Dr. Pearlean Brownie at  [facility] in 8 weeks  Hx of Stroke/TIA Daily Plavix   Hypertension Home meds:  None  Mildly Hypertensive 140s systolics  Blood Pressure Goal: BP less than 220/110   Hyperlipidemia Home meds:  Lipitor 40 mg, resumed in  hospital LDL 76, goal < 70 Continue statin at discharge  Hospital day # 0  I have personally obtained history,examined this patient, reviewed notes, independently viewed imaging studies, participated in medical decision making and plan of care.ROS completed by me personally and pertinent positives fully documented  I have made any additions or clarifications directly to the above note. Agree with note above.  Patient known to me from previous hospital as well as office visits for similar episodes of transient expressive aphasia and speech difficulties with negative neurovascular workup.  He has had 5 such episodes over the last 4 to 5 years.  He was started on empirical trial of Keppra in March present episode is the first since then it seems to be much able to get active back to the previous ones and so I believe Keppra may be helping we can trial a higher dose.  Recommend aspirin and Plavix for 3 weeks followed by Plavix alone and continue aggressive risk factor modification..  Patient had history of mild cognitive impairment and has a strong family history of Alzheimer's and will also check apoprotein E levels as well as order amyloid PET study later as an outpatient to look for early dementia.  Long discussion with patient and wife and answered questions.  Discussed with Dr.Dahal.  Greater than 50% time during which 50-minute visit was spent in counseling and coordination of care about his transient episode of speech difficulties and discussion about TIA versus seizure and answering questions.  Delia Heady, MD Medical Director [facility] Pager: [phone] 09/19/2023 12:52 PM   To contact Stroke Continuity provider, please refer to [email or web address]. After hours, contact General Neurology

## 2023-09-19 NOTE — Progress Notes (Signed)
  Carryover admission to the Day Admitter.  I discussed this case with the EDP, Ivar Drape, PA.  Per these discussions:   This is a 67 year old male who is being admitted for further TIA/stroke evaluation after presenting with a transient, 20 to 30-minute episode of aphasia that started at 7:30 PM on 09/18/2023, and completely resolved prior to arriving at [facility] ED.   CT head without acute process.   EDP discussed patient's case with on-call neurology, Dr. Derry Lory, who will formally consult, and requests [facility] admission for further stroke/TIA workup, including MRI brain.  There are existing orders for checking of lipid panel as well as hemoglobin A1c as well as echocardiogram.  It is reported that the patient has a ventricular atrial shunt, which will need to be turned into safe mood during the day in order to proceed with MRI brain.  I have placed an order for observation to med/tele for further evaluation management of the above.  I have placed some additional preliminary admit orders via the adult multi-morbid admission order set. I completed the basic focused stroke order set.  Also ordered PT, OT, and ST consults.    Newton Pigg, DO Hospitalist

## 2023-09-19 NOTE — Evaluation (Signed)
Occupational Therapy Evaluation Patient Details Name: Charles Marquez MRN: [id number] DOB: [birth year] Today's Date: 09/19/2023   History of Present Illness Pt is a 67 yo male admitted with 20-30 min run of aphasia iwth mild R facial droop. 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   Clinical Impression   Pt admitted with the above diagnosis and overall appears to have returned to baseline functional status. Pt is mildly unsteady on his feet but this is not uncommon for him due to his VP shunt. Pt also with some fluctuations in vision due to shunt.  Spoke at length with pt and wife about his current status and his balance and feel pt is safe to dc home with no follow up OT. Reached out to PT to see if pt might need a short run of OPPT or not.  No further OT needs at this time.        If plan is discharge home, recommend the following: Supervision due to cognitive status;Assist for transportation    Functional Status Assessment  Patient has not had a recent decline in their functional status  Equipment Recommendations  None recommended by OT    Recommendations for Other Services       Precautions / Restrictions Precautions Precautions: Fall Precaution Comments: unsteady on feet at baseline but no falls and no LOB. slightly usteady this am. Restrictions Weight Bearing Restrictions: No      Mobility Bed Mobility Overal bed mobility: Modified Independent             General bed mobility comments: no assist    Transfers Overall transfer level: Modified independent Equipment used: None               General transfer comment: Pt mildly unsteady when getting up for first time.  Pt has not been up walking around and usually is very active. As soon as pt got bearings and stretched legs (without assist) balance appeared better for walk in hallway.      Balance Overall balance assessment: Mild deficits observed, not  formally tested                                         ADL either performed or assessed with clinical judgement   ADL Overall ADL's : Modified independent                                       General ADL Comments: Pt at baseline with all adls     Vision Baseline Vision/History: 1 Wears glasses Ability to See in Adequate Light: 0 Adequate Patient Visual Report: No change from baseline Vision Assessment?: Yes Eye Alignment: Within Functional Limits Ocular Range of Motion: Within Functional Limits Alignment/Gaze Preference: Within Defined Limits Tracking/Visual Pursuits: Able to track stimulus in all quads without difficulty Saccades: Within functional limits Convergence: Within functional limits Visual Fields: No apparent deficits Additional Comments: Pt has had visual issues in past due to shunt.  All screens today do not show visual issues. Pt has given up driving due to visual issues.     Perception Perception: Within Functional Limits       Praxis Praxis: WFL       Pertinent Vitals/Pain Pain Assessment Pain Assessment: No/denies pain  Extremity/Trunk Assessment Upper Extremity Assessment Upper Extremity Assessment: Overall WFL for tasks assessed   Lower Extremity Assessment Lower Extremity Assessment: Defer to PT evaluation   Cervical / Trunk Assessment Cervical / Trunk Assessment: Normal   Communication Communication Communication: No apparent difficulties   Cognition Arousal: Alert Behavior During Therapy: WFL for tasks assessed/performed Overall Cognitive Status: History of cognitive impairments - at baseline                                 General Comments: Pt has been impaired since surgery 5 years ago for shunt.  Pt with STM deficits but has good insight into deficits.     General Comments  Pt appears to be at baseline for adls.    Exercises     Shoulder Instructions      Home Living  Family/patient expects to be discharged to:: Private residence Living Arrangements: Spouse/significant other Available Help at Discharge: Family;Available 24 hours/day Type of Home: House Home Access: Stairs to enter     Home Layout: Multi-level     Bathroom Shower/Tub: Producer, television/film/video: Standard     Home Equipment: None   Additional Comments: Pt is very familiar with therapy and wife is very involved in his care.  Pt appears to be at baseline.      Prior Functioning/Environment Prior Level of Function : Independent/Modified Independent             Mobility Comments: independent.  Slightly unsteady but no falls and does not use assitive device. ADLs Comments: independent in all but driving bc of fluctuating vision due to shunt.        OT Problem List:        OT Treatment/Interventions:      OT Goals(Current goals can be found in the care plan section) Acute Rehab OT Goals Patient Stated Goal: to go home OT Goal Formulation: All assessment and education complete, DC therapy  OT Frequency:      Co-evaluation              AM-PAC OT "6 Clicks" Daily Activity     Outcome Measure Help from another person eating meals?: None Help from another person taking care of personal grooming?: None Help from another person toileting, which includes using toliet, bedpan, or urinal?: None Help from another person bathing (including washing, rinsing, drying)?: None Help from another person to put on and taking off regular upper body clothing?: None Help from another person to put on and taking off regular lower body clothing?: None 6 Click Score: 24   End of Session Nurse Communication: Mobility status  Activity Tolerance: Patient tolerated treatment well Patient left: in bed;with call bell/phone within reach;with family/visitor present  OT Visit Diagnosis: Unsteadiness on feet (R26.81)                Time: 9528-4132 OT Time Calculation (min): 25  min Charges:  OT General Charges $OT Visit: 1 Visit OT Evaluation $OT Eval Moderate Complexity: 1 Mod OT Treatments $Self Care/Home Management : 8-22 mins  Hope Budds 09/19/2023, 11:01 AM

## 2023-09-19 NOTE — Discharge Summary (Signed)
Physician Discharge Summary  Charles Marquez JYN:[id number] DOB: [birth year] DOA: 09/18/2023  PCP: Pollyann Samples, MD  Admit date: 09/18/2023 Discharge date: 09/19/2023  Admitted From: home Discharge disposition: home  Recommendations at discharge:  Keppra has been increased from 500 mg to 750 mg twice daily Switch from Plavix to a combination of aspirin 81 mg daily and Plavix 75 mg daily for 3 weeks.  After that switch to aspirin 81 mg daily alone.  Brief narrative: Charles Marquez is a 67 y.o. 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 11/5, patient presented to the ED last night after about 20 minute episode of word finding difficulty and slight right facial droop. Around 8 PM, he was watching TV and turned to talk to his wife when the symptoms suddenly started.  EMS was called at the symptoms did not resolve. Reports compliance to medications including Plavix and Keppra.  Reports he had TIA multiple times in the past.  Never been on aspirin or anticoagulant before. Patient reports that he had multiple TIAs in the past.  At one point, he had a loop recorder placed that did not show any arrhythmia.  By the time of presentation to the ED, his symptoms had completely resolved. In the ED, he was afebrile, heart rate in 50s and 60s, blood pressure initially was 174/100, later trended down, 135/98 this morning Initial CBC, CMP unremarkable Blood alcohol level was not elevated CT head did not show any acute intracranial modality.  It showed a stable ventriculostomy catheter. CTA head and neck did not show any large vessel occlusion and was stable compared to the scan from last year. MRI brain unremarkable as well for acute findings. EKG showed normal sinus rhythm at 68 bpm, QTc 415  ms  Patient was seen by neurology. Neuroexam was nonfocal Kept on observation to [facility] for completion of TIA workup Completed the workup and is ready for discharge today.  Hospital course: TIA H/o TIAs Presented with an episode of word finding difficulty and slight right facial droop that lasted about 20 minutes By the time of presentation to the ED, his symptoms had completely resolved. CT head, CTA head and neck and MRI brain unremarkable. Patient was seen by neurology. Neuroexam was nonfocal Kept on observation to [facility] for completion of TIA workup A1c 5.2, HDL 39, LDL 78 Passed swallowing screening in the ED. Cardiac diet started. PTA, patient was on Plavix 75 mg daily.  Patient has never been on aspirin in the past.  I confirmed this with patient and his wife. I discussed with neurology.  Neurology Dr. Pearlean Brownie recommended to switch from Plavix to a combination of aspirin 81 mg daily and Plavix 75 mg daily for 3 weeks.  After that switch to aspirin 81 mg daily alone.  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 Imaging showed stable ventriculostomy catheter. Per patient and family, after an MRI is done, neurosurgery needs to reprogram the [facility] shunt.  Follows up with neurosurgeon Dr. Jordan Likes.  I sent a staff message to him.  H/o seizure Previously on keppra 5 mg twice daily. EEG was unremarkable. Neurology Dr. Pearlean Brownie recommended a trial of higher dose of Keppra at 750  mg BID.    Goals of care   Code Status: Full Code   Consultants: Neurology Family Communication: Wife at bedside  Wounds:  -    Discharge Exam:   Vitals:   09/19/23 1000 09/19/23 1015 09/19/23 1130 09/19/23 1223  BP: (!) 130/95 134/84 (!) 137/99 (!) 145/97  Pulse: 69 66 67 (!) 57  Resp: 16 14 14 11   Temp:      TempSrc:      SpO2: 98% 97% 95% 99%  Weight:      Height:        Body  mass index is 27.89 kg/m.  General exam: Pleasant, elderly Caucasian male.  Not in physical distress Skin: No rashes, lesions or ulcers. HEENT: Atraumatic, normocephalic, no obvious bleeding Lungs: Clear to auscultation bilaterally CVS: Regular rate and rhythm, no murmur GI/Abd soft, nontender, nondistended, bowel sound present CNS: Alert, awake, oriented x 3.  No focal deficit Psychiatry: Mood appropriate Extremities: No pedal edema, no calf tenderness  Follow ups:    Follow-up Information     Micki Riley, MD. Call in 8 week(s).   Specialties: Neurology, Radiology Why: Call to make an appointment Contact information: [address] [place] [address] [phone]         Beam, Chales Salmon, MD Follow up.   Specialty: Family Medicine Contact information: [address] [place] [address] [phone]                 Discharge Instructions:   Discharge Instructions     Call MD for:  difficulty breathing, headache or visual disturbances   Complete by: As directed    Call MD for:  extreme fatigue   Complete by: As directed    Call MD for:  hives   Complete by: As directed    Call MD for:  persistant dizziness or light-headedness   Complete by: As directed    Call MD for:  persistant nausea and vomiting   Complete by: As directed    Call MD for:  severe uncontrolled pain   Complete by: As directed    Call MD for:  temperature >100.4   Complete by: As directed    Diet general   Complete by: As directed    Discharge instructions   Complete by: As directed    Recommendations at discharge:   Keppra has been increased from 500 mg to 750 mg twice daily  Switch from Plavix to a combination of aspirin 81 mg daily and Plavix 75 mg daily for 3 weeks.  After that switch to aspirin 81 mg daily alone.  Seizure precautions: -Per [facility] statutes, patients with seizures are not allowed to drive until they have been seizure-free  for six months.  -Use caution when using heavy equipment or power tools.  -Avoid working on ladders or at heights.  -Take showers instead of baths. Ensure the water temperature is not too high on the home water heater.  -Do not go swimming alone.  -Do not lock yourself in a room alone (i.e. bathroom). -When caring for infants or small children, sit down when holding, feeding, or changing them to minimize risk of injury to the child in the event you have a seizure.  -Maintain good sleep hygiene. -Avoid alcohol.    If patient has another seizure, call 911 and bring them back to the ED if: A.  The seizure lasts longer than 5 minutes.      B.  The  patient doesn't wake shortly after the seizure or has new problems such as difficulty seeing, speaking or moving following the seizure C.  The patient was injured during the seizure D.  The patient has a temperature over 102 F (39C) E.  The patient vomited during the seizure and now is having trouble breathing    General discharge instructions: Follow with Primary MD Beam, Chales Salmon, MD in 7 days  Please request your PCP  to go over your hospital tests, procedures, radiology results at the follow up. Please get your medicines reviewed and adjusted.  Your PCP may decide to repeat certain labs or tests as needed. Do not drive, operate heavy machinery, perform activities at heights, swimming or participation in water activities or provide baby sitting services if your were admitted for syncope or siezures until you have seen by Primary MD or a Neurologist and advised to do so again. [place] Controlled Substance Reporting System database was reviewed. Do not drive, operate heavy machinery, perform activities at heights, swim, participate in water activities or provide baby-sitting services while on medications for pain, sleep and mood until your outpatient physician has reevaluated you and advised to do so again.  You are strongly recommended to comply  with the dose, frequency and duration of prescribed medications. Activity: As tolerated with Full fall precautions use walker/cane & assistance as needed Avoid using any recreational substances like cigarette, tobacco, alcohol, or non-prescribed drug. If you experience worsening of your admission symptoms, develop shortness of breath, life threatening emergency, suicidal or homicidal thoughts you must seek medical attention immediately by calling 911 or calling your MD immediately  if symptoms less severe. You must read complete instructions/literature along with all the possible adverse reactions/side effects for all the medicines you take and that have been prescribed to you. Take any new medicine only after you have completely understood and accepted all the possible adverse reactions/side effects.  Wear Seat belts while driving. You were cared for by a hospitalist during your hospital stay. If you have any questions about your discharge medications or the care you received while you were in the hospital after you are discharged, you can call the unit and ask to speak with the hospitalist or the covering physician. Once you are discharged, your primary care physician will handle any further medical issues. Please note that NO REFILLS for any discharge medications will be authorized once you are discharged, as it is imperative that you return to your primary care physician (or establish a relationship with a primary care physician if you do not have one).   Increase activity slowly   Complete by: As directed        Discharge Medications:   Allergies as of 09/19/2023   No Known Allergies      Medication List     TAKE these medications    acetaminophen 500 MG tablet Commonly known as: TYLENOL Take 2 tablets (1,000 mg total) by mouth every 8 (eight) hours as needed for moderate pain.   aspirin EC 81 MG tablet Take 1 tablet (81 mg total) by mouth daily. Swallow whole.   atorvastatin 40  MG tablet Commonly known as: LIPITOR Take 1 tablet (40 mg total) by mouth daily. What changed: when to take this   Cerefolin 04-13-49-5 MG Tabs TAKE 1 TABLET BY MOUTH DAILY   cetirizine 10 MG tablet Commonly known as: ZYRTEC Take 10 mg by mouth every evening.   clopidogrel 75 MG tablet Commonly known as: PLAVIX Take  1 tablet (75 mg total) by mouth every evening for 21 days.   levETIRAcetam 750 MG tablet Commonly known as: KEPPRA Take 1 tablet (750 mg total) by mouth 2 (two) times daily. What changed:  medication strength how much to take   PreserVision AREDS 2 Caps Take 1 capsule by mouth 2 (two) times daily.   MULTIVITAMIN ADULTS 50+ PO Take 1 tablet by mouth daily.   Vitamin D 50 MCG (2000 UT) tablet Take 2,000 Units by mouth daily.         The results of significant diagnostics from this hospitalization (including imaging, microbiology, ancillary and laboratory) are listed below for reference.    Procedures and Diagnostic Studies:   EEG adult  Result Date: 09/19/2023 Charlsie Quest, MD     09/19/2023 11:45 AM Patient Name: Tavio Biegel MRN: [id number] Epilepsy Attending: Charlsie Quest Referring Physician/Provider: Erick Blinks, MD Date: 09/19/2023 Duration: 22.24 mins Patient history: 67 y.o. male with hx of HLD, strokes, TIAs, prior hydrocephalus s/p shunt and complicated by prior episodes of meningitis, on keppra for episodes that were presumed to be seizures who presents with about 20 mins episode of word finding difficulty and slight R facial droop. EEG to evaluate for seizure Level of alertness: Awake AEDs during EEG study: LEV Technical aspects: This EEG study was done with scalp electrodes positioned according to the 10-20 International system of electrode placement. Electrical activity was reviewed with band pass filter of 1-70Hz , sensitivity of 7 uV/mm, display speed of 12mm/sec with a 60Hz  notched filter applied as appropriate. EEG data were  recorded continuously and digitally stored.  Video monitoring was available and reviewed as appropriate. Description: The posterior dominant rhythm consists of 9 Hz activity of moderate voltage (25-35 uV) seen predominantly in posterior head regions, symmetric and reactive to eye opening and eye closing. Physiologic photic driving was not seen during photic stimulation.  Hyperventilation was not performed.   IMPRESSION: This study is within normal limits. No seizures or epileptiform discharges were seen throughout the recording. A normal interictal EEG does not exclude the diagnosis of epilepsy. Charlsie Quest   [name] CONTRAST  Result Date: 09/19/2023 CLINICAL DATA:  Neuro deficit, acute, stroke suspected.  Aphasia. EXAM: MRI HEAD WITHOUT CONTRAST TECHNIQUE: Multiplanar, multiecho pulse sequences of the brain and surrounding structures were obtained without intravenous contrast. COMPARISON:  Head CT September 18, 2023 FINDINGS: Brain: Susceptibility artifact from shunt reservoir degrades images in the left parietooccipital region and left posterior fossa, particularly on the diffusion-weighted and susceptibility weighted sequences. Within this limitation, no acute infarct is identified. No acute hemorrhage, extra-axial collection or mass lesion. Left parietal approach ventricular shunt with the tip within the right lateral ventricle. The ventricular system has diminutive size, stable when compared to CT performed September 18, 2023 but smaller compared to MRI performed in March 2023. Focal septation with ectasia of the posterior portion of the third ventricle, likely sequela of prior inflammatory process. Unchanged appearance of areas of encephalomalacia and gliosis in the right frontal lobe and bilateral parietal lobes with associated hemosiderin deposit in the right parietal lobe from prior hemorrhage. There also foci of encephalomalacia in the body of the corpus callosum and left thalamocapsular region.  Vascular: Normal flow voids., noting ectatic bilateral supraclinoid ICAs and M1/MCA segments. Skull and upper cervical spine: No focal marrow lesion. Susceptibility artifact in the left parietal scalp related to shunt reservoir. Sinuses/Orbits: Negative. Other: None. IMPRESSION: 1. No acute intracranial abnormality. 2. Stable diminutive size  of the ventricular system when compared to CT performed September 18, 2023 but smaller compared to MRI performed in March 2023. 3. Stable areas of encephalomalacia and gliosis in the right frontal lobe and bilateral parietal lobes with associated hemosiderin deposit in the right parietal lobe from prior hemorrhage. Electronically Signed   By: Baldemar Lenis M.D.   On: 09/19/2023 10:49   CT ANGIO HEAD NECK W WO CM  Result Date: 09/19/2023 CLINICAL DATA:  67 year old male with a history of ventricular shunt. Aphasia and facial droop onset yesterday. EXAM: CT ANGIOGRAPHY HEAD AND NECK WITH AND WITHOUT CONTRAST TECHNIQUE: Multidetector CT imaging of the head and neck was performed using the standard protocol during bolus administration of intravenous contrast. Multiplanar CT image reconstructions and MIPs were obtained to evaluate the vascular anatomy. Carotid stenosis measurements (when applicable) are obtained utilizing NASCET criteria, using the distal internal carotid diameter as the denominator. RADIATION DOSE REDUCTION: This exam was performed according to the departmental dose-optimization program which includes automated exposure control, adjustment of the mA and/or kV according to patient size and/or use of iterative reconstruction technique. CONTRAST:  75mL OMNIPAQUE IOHEXOL 350 MG/ML SOLN COMPARISON:  Head CT 2131 hours yesterday. Prior brain MRI 02/02/2022, prior CTA head and neck 02/01/2022. FINDINGS: CT HEAD Brain: Left posterior approach ventriculostomy shunt is stable. No ventriculomegaly. Chronic cerebral white matter disease and multifocal cortical  encephalomalacia, which might be related to previous shunt tracks, stable. Chronic posterior limb internal capsule and thalamic heterogeneity on the left is stable. No acute intracranial hemorrhage identified. No intracranial mass effect or midline shift. No acute cortically based infarct identified. Calvarium and skull base: Chronic right hemisphere burr holes. Left posterior burr hole. No acute osseous abnormality identified. Paranasal sinuses: Visualized paranasal sinuses and mastoids are stable and well aerated. Orbits: No gaze deviation. Stable left scalp convexity shunt reservoir and tubing. CTA NECK Skeleton: No acute osseous abnormality identified. Upper chest: The left side shunt tubing tracks into the left internal jugular vein and to the confluence of the innominate veins and SVC as before. Negative visible upper lungs and superior mediastinum. Other neck: Stable from last year, no acute finding. Aortic arch: Tortuous aortic arch with mild calcified atherosclerosis. Three vessel arch configuration. Right carotid system: Mildly tortuous brachiocephalic artery and right CCA origin with no plaque or stenosis. Soft and calcified plaque at the right ICA origin, proximal bulb, with less than 50 % stenosis with respect to the distal vessel has not significantly changed. Tortuous right ICA just below the skull base. Left carotid system: Minimal plaque at the left CCA origin without stenosis. Mild calcified plaque at the left ICA origin without stenosis. Vertebral arteries: Soft and calcified plaque at the right subclavian artery origin is stable without stenosis. Right vertebral origin is normal. Right vertebral artery appears dominant, patent to the skull base with no plaque or stenosis. Proximal left subclavian artery atherosclerosis is stable and mild. Left vertebral artery origin remains normal. Tortuous left V1 segment. Non dominant left vertebral artery is stable, patent to the skull base with no plaque or  stenosis. CTA HEAD Posterior circulation: Distal vertebral arteries and vertebrobasilar junction are patent with dominant right V4. Mild calcified plaque at the vertebrobasilar junction without stenosis. Normal left PICA and dominant appearing right AICA origins. Patent basilar artery with mild irregularity but no significant stenosis. Patent SCA and right PCA origins. Fetal type left PCA origin again noted. Right posterior communicating artery diminutive or absent. Left PCA branches are stable  and within normal limits. Right PCA branches are stable and within normal limits. Anterior circulation: Both ICA siphons are patent. Normal left posterior communicating artery and no significant left siphon plaque or stenosis. Minimal right siphon calcified plaque without stenosis. Patent carotid termini. Patent MCA and ACA origins. Dominant right A1. Anterior communicating artery and bilateral ACA branches are stable and within normal limits. Left MCA M1 segment and bifurcation are mildly ectatic without stenosis. Mild calcified plaque at the right MCA origin, no stenosis. Mildly ectatic right M1 and bifurcation without stenosis. Bilateral MCA branches are stable and within normal limits. Venous sinuses: Early contrast timing, grossly patent. Anatomic variants: Dominant right vertebral artery. Fetal type left PCA origin. Dominant right ACA A1. Review of the MIP images confirms the above findings IMPRESSION: 1. No large vessel occlusion. Stable CTA Head and Neck since last year: Generally mild atherosclerosis in the head and neck. No hemodynamically significant stenosis identified. 2. Stable CT appearance of the brain. No acute intracranial abnormality. 3.  Aortic Atherosclerosis (ICD10-I70.0). Electronically Signed   By: Odessa Fleming M.D.   On: 09/19/2023 05:59   CT Head Wo Contrast  Result Date: 09/18/2023 CLINICAL DATA:  Right facial droop and expressive aphasia since 6:30 p.m. EXAM: CT HEAD WITHOUT CONTRAST TECHNIQUE:  Contiguous axial images were obtained from the base of the skull through the vertex without intravenous contrast. RADIATION DOSE REDUCTION: This exam was performed according to the departmental dose-optimization program which includes automated exposure control, adjustment of the mA and/or kV according to patient size and/or use of iterative reconstruction technique. COMPARISON:  02/02/2022, 02/01/2022 FINDINGS: Brain: Stable ventriculostomy catheter via left parietal approach, tip in the region of the right thalamus. Stable right frontal encephalomalacia likely from prior catheter placement. Chronic hypodensities are again seen throughout the periventricular white matter, which may reflect chronic small vessel ischemic change. No evidence of acute infarct or hemorrhage. Lateral ventricles and midline structures are stable. No acute extra-axial fluid collections. No mass effect. Vascular: No hyperdense vessel or unexpected calcification. Stable atherosclerosis. Skull: Stable postsurgical changes are seen within the right frontal and bilateral parietal regions of the calvarium. No acute or destructive bony abnormality. Sinuses/Orbits: No acute finding. Other: None. IMPRESSION: 1. No acute intracranial process. 2. Stable ventriculostomy catheter.  No evidence of hydrocephalus. Electronically Signed   By: Sharlet Salina M.D.   On: 09/18/2023 22:34     Labs:   Basic Metabolic Panel: Recent Labs  Lab 09/18/23 2135  NA 138  K 4.2  CL 103  CO2 27  GLUCOSE 95  BUN 16  CREATININE 0.95  CALCIUM 8.9   GFR Estimated Creatinine Clearance: 89.4 mL/min (by C-G formula based on SCr of 0.95 mg/dL). Liver Function Tests: Recent Labs  Lab 09/18/23 2240  AST 34  ALT 27  ALKPHOS 39  BILITOT 0.9  PROT 6.6  ALBUMIN 3.6   No results for input(s): "LIPASE", "AMYLASE" in the last 168 hours. No results for input(s): "AMMONIA" in the last 168 hours. Coagulation profile Recent Labs  Lab 09/18/23 2240  INR  1.1    CBC: Recent Labs  Lab 09/18/23 2135 09/18/23 2240  WBC 6.6 6.0  NEUTROABS  --  3.5  HGB 13.7 13.0  HCT 41.5 39.1  MCV 95.8 94.7  PLT 184 178   Cardiac Enzymes: No results for input(s): "CKTOTAL", "CKMB", "CKMBINDEX", "TROPONINI" in the last 168 hours. BNP: Invalid input(s): "POCBNP" CBG: Recent Labs  Lab 09/19/23 0728  GLUCAP 92   D-Dimer No results  for input(s): "DDIMER" in the last 72 hours. Hgb A1c Recent Labs    09/19/23 0507  HGBA1C 5.2   Lipid Profile Recent Labs    09/19/23 0820  CHOL 134  HDL 39*  LDLCALC 78  TRIG 83  CHOLHDL 3.4   Thyroid function studies No results for input(s): "TSH", "T4TOTAL", "T3FREE", "THYROIDAB" in the last 72 hours.  Invalid input(s): "FREET3" Anemia work up No results for input(s): "VITAMINB12", "FOLATE", "FERRITIN", "TIBC", "IRON", "RETICCTPCT" in the last 72 hours. Microbiology No results found for this or any previous visit (from the past 240 hour(s)).  Time coordinating discharge: 25 minutes  Signed: Lakethia Coppess  [facility] 09/19/2023, 1:35 PM

## 2023-09-19 NOTE — Consult Note (Addendum)
NEUROLOGY CONSULT NOTE   Date of service: September 19, 2023 Patient Name: Charles Marquez MRN:  [id number] DOB:  1956/04/24 Chief Complaint: "aphasia" Requesting Provider: Tilden Fossa, MD  History of Present Illness  Charles Marquez is a 67 y.o. male with hx of HLD, strokes, TIAs, prior hydrocephalus s/p shunt and complicated by prior episodes of meningitis, on keppra for episodes that were presumed to be seizures who presents with about 20 mins episode of word finding difficulty and slight R facial droop. He was watching TV and turned to talk to his wife around 79 when this happened. Wife noted significant word finding difficulty and noted the R facial asymmetry. Called her daughter and still had persistent aphasia. They called EMS and the episode was resolving by the time EMS arrived.  Patient reports compliance with Keppra and Aspirin at home.   LKW: 1940 Modified rankin score: 0-Completely asymptomatic and back to baseline post- stroke IV Thrombolysis: not offered, resolution of symptoms. EVT: not offered, no symptoms.  NIHSS components Score: Comment  1a Level of Conscious 0[x]  1[]  2[]  3[]      1b LOC Questions 0[x]  1[]  2[]       1c LOC Commands 0[x]  1[]  2[]       2 Best Gaze 0[x]  1[]  2[]       3 Visual 0[x]  1[]  2[]  3[]      4 Facial Palsy 0[x]  1[]  2[]  3[]      5a Motor Arm - left 0[x]  1[]  2[]  3[]  4[]  UN[]    5b Motor Arm - Right 0[x]  1[]  2[]  3[]  4[]  UN[]    6a Motor Leg - Left 0[x]  1[]  2[]  3[]  4[]  UN[]    6b Motor Leg - Right 0[x]  1[]  2[]  3[]  4[]  UN[]    7 Limb Ataxia 0[x]  1[]  2[]  3[]  UN[]     8 Sensory 0[x]  1[]  2[]  UN[]      9 Best Language 0[x]  1[]  2[]  3[]      10 Dysarthria 0[x]  1[]  2[]  UN[]      11 Extinct. and Inattention 0[x]  1[]  2[]       TOTAL: 0       ROS  Comprehensive ROS performed and pertinent positives documented in HPI  Past History   Past Medical History:  Diagnosis Date   Anxiety    Hypercholesteremia    Stroke (HCC)    tia's   TIA (transient  ischemic attack)    09.15    Past Surgical History:  Procedure Laterality Date   Fractured arm Left 12   HERNIA REPAIR Right 3/12   LAPAROSCOPIC REVISION VENTRICULAR-PERITONEAL (V-P) SHUNT N/A 08/26/2018   Procedure: LAPAROSCOPIC INSERTION VENTRICULAR-PERITONEAL (V-P) SHUNT;  Surgeon: Julio Sicks, MD;  Location: [facility] OR;  Service: Neurosurgery;  Laterality: N/A;   LAPAROSCOPIC REVISION VENTRICULAR-PERITONEAL (V-P) SHUNT N/A 01/17/2019   Procedure: LAPAROSCOPIC REVISION VENTRICULAR-PERITONEAL (V-P) SHUNT;  Surgeon: Manus Rudd, MD;  Location: [facility] OR;  Service: General;  Laterality: N/A;   LOOP RECORDER INSERTION N/A 04/10/2017   Procedure: Loop Recorder Insertion;  Surgeon: Hillis Range, MD;  Location: [facility];  Service: Cardiovascular;  Laterality: N/A;   SHUNT REMOVAL Right 03/13/2016   Procedure: SHUNT REMOVAL;  Surgeon: Julio Sicks, MD;  Location: [facility];  Service: Neurosurgery;  Laterality: Right;   SHUNT REMOVAL Right 08/09/2018   Procedure: SHUNT REMOVAL With Placement of Ventricular Catheter;  Surgeon: Lisbeth Renshaw, MD;  Location: [facility] OR;  Service: Neurosurgery;  Laterality: Right;   SHUNT REVISION Right 08/05/2018   Procedure: SHUNT REVISION;  Surgeon: Julio Sicks, MD;  Location:  [facility] OR;  Service: Neurosurgery;  Laterality: Right;   SHUNT REVISION Left 12/06/2018   Procedure: Shunt Revision - left;  Surgeon: Julio Sicks, MD;  Location: [facility] OR;  Service: Neurosurgery;  Laterality: Left;  Shunt Revision - left   SHUNT REVISION N/A 12/09/2018   Procedure: SHUNT REVISION;  Surgeon: Julio Sicks, MD;  Location: [facility] OR;  Service: Neurosurgery;  Laterality: N/A;   SHUNT REVISION Left 01/17/2019   Procedure: Shunt Revision - left;  Surgeon: Julio Sicks, MD;  Location: [facility] OR;  Service: Neurosurgery;  Laterality: Left;   SHUNT REVISION Left 04/23/2019   Procedure: Left occipital VP shunt revision with placement of Ventriculo-Atrial Shunt;  Surgeon: Julio Sicks, MD;  Location: [facility] OR;   Service: Neurosurgery;  Laterality: Left;   SHUNT REVISION VENTRICULAR-PERITONEAL Left 08/26/2018   Procedure: SHUNT REVISION VENTRICULAR-PERITONEAL;  Surgeon: Julio Sicks, MD;  Location: [facility] OR;  Service: Neurosurgery;  Laterality: Left;   SHUNT REVISION VENTRICULAR-PERITONEAL Left 09/02/2018   Procedure: Left Occipital VP shunt revision;  Surgeon: Julio Sicks, MD;  Location: [facility] OR;  Service: Neurosurgery;  Laterality: Left;   VASECTOMY  10/02/1997   VENTRICULOPERITONEAL SHUNT Right 12/18/2014   Procedure: Shunt Placment - right occipital VP shunt ;  Surgeon: Temple Pacini, MD;  Location: [facility]UR[facility];  Service: Neurosurgery;  Laterality: Right;  Shunt Placment - right occipital VP shunt    VENTRICULOPERITONEAL SHUNT Right 07/22/2018   Procedure: Shunt Placment right occipital;  Surgeon: Julio Sicks, MD;  Location: [facility] OR;  Service: Neurosurgery;  Laterality: Right;   VENTRICULOPERITONEAL SHUNT Left 08/26/2018   Procedure: LEFT SIDED VENTRICULAR-PERITONEAL SHUNT;  Surgeon: Julio Sicks, MD;  Location: [facility] OR;  Service: Neurosurgery;  Laterality: Left;   VENTRICULOSTOMY Right 08/09/2018   Procedure: VENTRICULOSTOMY;  Surgeon: Lisbeth Renshaw, MD;  Location: [facility] OR;  Service: Neurosurgery;  Laterality: Right;    Family History: Family History  Problem Relation Age of Onset   COPD Mother    Lung cancer Father    Alzheimer's disease Father     Social History  reports that he has never smoked. He has never used smokeless tobacco. He reports current alcohol use. He reports that he does not use drugs.  No Known Allergies  Medications  No current facility-administered medications for this encounter.  Current Outpatient Medications:    acetaminophen (TYLENOL) 500 MG tablet, Take 2 tablets (1,000 mg total) by mouth every 8 (eight) hours as needed for moderate pain., Disp: 30 tablet, Rfl: 0   atorvastatin (LIPITOR) 40 MG tablet, Take 1 tablet (40 mg total) by mouth daily. (Patient taking differently:  Take 40 mg by mouth every evening.), Disp: 90 tablet, Rfl: 3   cetirizine (ZYRTEC) 10 MG tablet, Take 10 mg by mouth every evening., Disp: , Rfl:    Cholecalciferol (VITAMIN D) 50 MCG (2000 UT) tablet, Take 2,000 Units by mouth daily., Disp: , Rfl:    L-Methylfolate-B12-B6-B2 (CEREFOLIN) 04-13-49-5 MG TABS, TAKE 1 TABLET BY MOUTH DAILY, Disp: 90 tablet, Rfl: 3   levETIRAcetam (KEPPRA) 500 MG tablet, TAKE 1 TABLET BY MOUTH TWICE A DAY, Disp: 180 tablet, Rfl: 3   Multiple Vitamins-Minerals (MULTIVITAMIN ADULTS 50+ PO), Take 1 tablet by mouth daily., Disp: , Rfl:    Multiple Vitamins-Minerals (PRESERVISION AREDS 2) CAPS, Take 1 capsule by mouth 2 (two) times daily., Disp: , Rfl:    clopidogrel (PLAVIX) 75 MG tablet, Take 75 mg by mouth every evening., Disp: , Rfl:   Vitals   Vitals:   09/18/23 2103 09/18/23  2107  BP: (!) 174/100   Pulse: 67   Resp: 18   Temp: 97.8 F (36.6 C)   TempSrc: Oral   SpO2: 100%   Weight:  90.7 kg  Height:  5\' 11"  (1.803 m)    Body mass index is 27.89 kg/m.  Physical Exam   Constitutional: Appears well-developed and well-nourished.  Psych: Affect appropriate to situation.  Eyes: No scleral injection.  HENT: No OP obstruction.  Head: Normocephalic.  Cardiovascular: Normal rate and regular rhythm.  Respiratory: Effort normal, non-labored breathing.  GI: Soft.  No distension. There is no tenderness.  Skin: WDI.   Neurologic Examination  Mental status/Cognition: Alert, oriented to self, place, month and year, good attention.  Speech/language: Fluent, comprehension intact, object naming intact, repetition intact.  Cranial nerves:   CN II Pupils equal and reactive to light, no VF deficits    CN III,IV,VI EOM intact, no gaze preference or deviation, no nystagmus    CN V normal sensation in V1, V2, and V3 segments bilaterally    CN VII no asymmetry, no nasolabial fold flattening    CN VIII normal hearing to speech    CN IX & X normal palatal elevation, no  uvular deviation    CN XI 5/5 head turn and 5/5 shoulder shrug bilaterally    CN XII midline tongue protrusion    Motor:  Muscle bulk: normal, tone normal, pronator drift none tremor none Mvmt Root Nerve  Muscle Right Left Comments  SA C5/6 Ax Deltoid 5 5   EF C5/6 Mc Biceps 5 5   EE C6/7/8 Rad Triceps 5 5   WF C6/7 Med FCR     WE C7/8 PIN ECU     F Ab C8/T1 U ADM/FDI 5 5   HF L1/2/3 Fem Illopsoas 5 5   KE L2/3/4 Fem Quad 5 5   DF L4/5 D Peron Tib Ant 5 5   PF S1/2 Tibial Grc/Sol 5 5    Sensation:  Light touch Intact throughout   Pin prick    Temperature    Vibration   Proprioception    Coordination/Complex Motor:  - Finger to Nose intact BL - Heel to shin intact BL - Rapid alternating movement are normal - Gait: deferred.  Labs/Imaging/Neurodiagnostic studies   CBC:  Recent Labs  Lab 2023/10/01 2135 October 01, 2023 2240  WBC 6.6 6.0  NEUTROABS  --  3.5  HGB 13.7 13.0  HCT 41.5 39.1  MCV 95.8 94.7  PLT 184 178    Basic Metabolic Panel:  Lab Results  Component Value Date   NA 138 01-Oct-2023   K 4.2 10-01-23   CO2 27 10/01/2023   GLUCOSE 95 10-01-2023   BUN 16 10/01/2023   CREATININE 0.95 2023/10/01   CALCIUM 8.9 10-01-2023   GFRNONAA >60 10/01/23   GFRAA >60 04/21/2019    Lipid Panel:  Lab Results  Component Value Date   LDLCALC 76 02/02/2022    HgbA1c:  Lab Results  Component Value Date   HGBA1C 5.3 02/02/2022    Urine Drug Screen:     Component Value Date/Time   LABOPIA NONE DETECTED 02/01/2022 1754   COCAINSCRNUR NONE DETECTED 02/01/2022 1754   LABBENZ NONE DETECTED 02/01/2022 1754   AMPHETMU NONE DETECTED 02/01/2022 1754   THCU NONE DETECTED 02/01/2022 1754   LABBARB NONE DETECTED 02/01/2022 1754     Alcohol Level     Component Value Date/Time   ETH <10 01-Oct-2023 2240    INR  Lab Results  Component Value Date   INR 1.1 09/18/2023    APTT  Lab Results  Component Value Date   APTT 29 09/18/2023    AED levels: No results  found for: "PHENYTOIN", "ZONISAMIDE", "LAMOTRIGINE", "LEVETIRACETA"    CT Head without contrast(Personally reviewed): CTH was negative for a large hypodensity concerning for a large territory infarct or hyperdensity concerning for an ICH  CT angio Head and Neck with contrast: pending  MRI Brain: pending  Neurodiagnostics rEEG:  pending  Impression   Charles Marquez is a 67 y.o. male with hx of HLD, strokes, TIAs, prior hydrocephalus s/p shunt and complicated by prior episodes of meningitis, on keppra for episodes that were presumed to be seizures who presents with about 20 mins episode of word finding difficulty and slight R facial droop.  Symptoms resolved spontaneously. Episode clinically more concerning for a stroke/TIA rather than seizure specially with preserved recollection, no noted twitching or jerking and no post ictal period.  Recommendations  - Frequent Neuro checks per stroke unit protocol - Recommend brain imaging with MRI Brain without contrast - Recommend Vascular imaging with CT angio head and neck - Recommend obtaining TTE - Recommend obtaining Lipid panel with LDL - Please start statin if LDL > 70 - Recommend HbA1c to evaluate for diabetes and how well it is controlled. - Antithrombotic - Aspirin 81mg  daily along with plavix 75mg  daily x 21 days, followed by Aspirin 81mg  daily. - Recommend DVT ppx - SBP goal - permissive hypertension first 24 h < 220/110. Held home meds.  - Recommend Telemetry monitoring for arrythmia - Recommend bedside swallow screen prior to PO intake. - Stroke education booklet - Recommend PT/OT/SLP consult - rEEG ______________________________________________________________________    Welton Flakes [facility]

## 2023-09-19 NOTE — Progress Notes (Signed)
EEG complete - results pending 

## 2023-09-19 NOTE — H&P (Signed)
[facility] History and Physical  Charles Marquez WUX:[id number] DOB: [birth year] DOA: 09/18/2023 PCP: Pollyann Samples, MD  Presented from: Home Chief Complaint: Word finding difficulty and facial droop  History of Present Illness: Charles Marquez is a 67 y.o. 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 11/5, patient presented to the ED last night after about 20 minute episode of word finding difficulty and slight right facial droop. Around 8 PM, he was watching TV and turned to talk to his wife when the symptoms suddenly started.  EMS was called at the symptoms did not resolve. Reports compliance to medications including Plavix and Keppra.  Reports he had TIA multiple times in the past.  Never been on aspirin or anticoagulant before. Patient reports that he had multiple TIAs in the past.  At one point, he had a loop recorder placed that did not show any arrhythmia.  By the time of presentation to the ED, his symptoms had completely resolved. In the ED, he was afebrile, heart rate in 50s and 60s, blood pressure initially was 174/100, later trended down, 135/98 this morning Initial CBC, CMP unremarkable Blood alcohol level was not elevated CT head did not show any acute intracranial modality.  It showed a stable ventriculostomy catheter. CTA head and neck did not show any large vessel occlusion and was stable compared to the scan from last year. MRI brain ordered, pending EKG showed normal sinus rhythm at 68 bpm, QTc 415 millisecond  Patient was seen by neurology. Neuroexam was nonfocal Kept on observation to [facility] for completion of TIA workup  I received this patient as a carryover admission from last night. At the time of my evaluation, patient was propped up in bed.  Felt  completely back to baseline.  Wife was at bedside. History reviewed and detailed as above.  Review of Systems:  All systems were reviewed and were negative unless otherwise mentioned in the HPI   Past medical history: Past Medical History:  Diagnosis Date   Anxiety    Hypercholesteremia    Stroke [facility])    tia's   TIA (transient ischemic attack)    09.15    Past surgical history: Past Surgical History:  Procedure Laterality Date   Fractured arm Left 12   HERNIA REPAIR Right 3/12   LAPAROSCOPIC REVISION VENTRICULAR-PERITONEAL (V-P) SHUNT N/A 08/26/2018   Procedure: LAPAROSCOPIC INSERTION VENTRICULAR-PERITONEAL (V-P) SHUNT;  Surgeon: Julio Sicks, MD;  Location: [facility] OR;  Service: Neurosurgery;  Laterality: N/A;   LAPAROSCOPIC REVISION VENTRICULAR-PERITONEAL (V-P) SHUNT N/A 01/17/2019   Procedure: LAPAROSCOPIC REVISION VENTRICULAR-PERITONEAL (V-P) SHUNT;  Surgeon: Manus Rudd, MD;  Location: [facility] OR;  Service: General;  Laterality: N/A;   LOOP RECORDER INSERTION N/A 04/10/2017   Procedure: Loop Recorder Insertion;  Surgeon: Hillis Range, MD;  Location: [facility] INVASIVE CV LAB;  Service: Cardiovascular;  Laterality: N/A;   SHUNT REMOVAL Right 03/13/2016   Procedure: SHUNT REMOVAL;  Surgeon: Julio Sicks, MD;  Location: [facility] NEURO ORS;  Service: Neurosurgery;  Laterality: Right;   SHUNT REMOVAL Right 08/09/2018   Procedure: SHUNT REMOVAL With Placement of Ventricular Catheter;  Surgeon: Lisbeth Renshaw, MD;  Location: [facility] OR;  Service: Neurosurgery;  Laterality: Right;   SHUNT REVISION Right 08/05/2018   Procedure: SHUNT REVISION;  Surgeon: Julio Sicks, MD;  Location: [facility] OR;  Service:  Neurosurgery;  Laterality: Right;   SHUNT REVISION Left 12/06/2018   Procedure: Shunt Revision - left;  Surgeon: Julio Sicks, MD;  Location: [facility] OR;  Service: Neurosurgery;  Laterality: Left;  Shunt Revision - left   SHUNT REVISION N/A 12/09/2018   Procedure: SHUNT REVISION;  Surgeon: Julio Sicks, MD;  Location: [facility] OR;   Service: Neurosurgery;  Laterality: N/A;   SHUNT REVISION Left 01/17/2019   Procedure: Shunt Revision - left;  Surgeon: Julio Sicks, MD;  Location: [facility] OR;  Service: Neurosurgery;  Laterality: Left;   SHUNT REVISION Left 04/23/2019   Procedure: Left occipital VP shunt revision with placement of Ventriculo-Atrial Shunt;  Surgeon: Julio Sicks, MD;  Location: [facility] OR;  Service: Neurosurgery;  Laterality: Left;   SHUNT REVISION VENTRICULAR-PERITONEAL Left 08/26/2018   Procedure: SHUNT REVISION VENTRICULAR-PERITONEAL;  Surgeon: Julio Sicks, MD;  Location: [facility] OR;  Service: Neurosurgery;  Laterality: Left;   SHUNT REVISION VENTRICULAR-PERITONEAL Left 09/02/2018   Procedure: Left Occipital VP shunt revision;  Surgeon: Julio Sicks, MD;  Location[facility] OR;  Service: Neurosurgery;  Laterality: Left;   VASECTOMY  10/02/1997   VENTRICULOPERITONEAL SHUNT Right 12/18/2014   Procedure: Shunt Placment - right occipital VP shunt ;  Surgeon: Temple Pacini, MD;  Location: [facility] NEURO ORS;  Service: Neurosurgery;  Laterality: Right;  Shunt Placment - right occipital VP shunt    VENTRICULOPERITONEAL SHUNT Right 07/22/2018   Procedure: Shunt Placment right occipital;  Surgeon: Julio Sicks, MD;  Location: [facility] OR;  Service: Neurosurgery;  Laterality: Right;   VENTRICULOPERITONEAL SHUNT Left 08/26/2018   Procedure: LEFT SIDED VENTRICULAR-PERITONEAL SHUNT;  Surgeon: Julio Sicks, MD;  Location: [facility] OR;  Service: Neurosurgery;  Laterality: Left;   VENTRICULOSTOMY Right 08/09/2018   Procedure: VENTRICULOSTOMY;  Surgeon: Lisbeth Renshaw, MD;  Location: [facility] OR;  Service: Neurosurgery;  Laterality: Right;    Social History:  reports that he has never smoked. He has never used smokeless tobacco. He reports current alcohol use. He reports that he does not use drugs.  Allergies:  No Known Allergies Patient has no known allergies.   Family history:  Family History  Problem Relation Age of Onset   COPD Mother    Lung cancer Father     Alzheimer's disease Father      Physical Exam: Vitals:   09/19/23 0734 09/19/23 0800 09/19/23 0830 09/19/23 0940  BP:  (!) 152/97 (!) 135/98   Pulse:  62 60   Resp:  14 17   Temp:    98 F (36.7 C)  TempSrc:    Oral  SpO2: 95% 91% 96%   Weight:      Height:       Wt Readings from Last 3 Encounters:  09/18/23 90.7 kg  08/14/23 93.1 kg  08/15/22 95.5 kg   Body mass index is 27.89 kg/m.  General exam: Pleasant, elderly Caucasian male.  Not in physical distress Skin: No rashes, lesions or ulcers. HEENT: Atraumatic, normocephalic, no obvious bleeding Lungs: Clear to auscultation bilaterally CVS: Regular rate and rhythm, no murmur GI/Abd soft, nontender, nondistended, bowel sound present CNS: Alert, awake, oriented x 3.  No focal deficit Psychiatry: Mood appropriate Extremities: No pedal edema, no calf tenderness   ------------------------------------------------------------------------------------------------------ Assessment/Plan: Principal Problem:   TIA (transient ischemic attack)  TIA History of TIAs HLD Presented with an episode of word finding difficulty and slight right facial droop that lasted about 20 minutes By the time of presentation to the ED, his symptoms had completely resolved. CT head, CTA head and  neck unremarkable. MRI pending Patient was seen by neurology. Neuroexam was nonfocal Kept on observation to [facility] for completion of TIA workup Echocardiogram pending A1c 5.2, Lipid panel pending Passed swallowing screening in the ED. Cardiac diet started. PTA meds- Plavix, Lipitor. PT OT eval pending. Patient reports that he had multiple TIAs in the past.  At one point, he had a loop recorder placed that did not show any arrhythmia.  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 Imaging showed stable ventriculostomy catheter. Per patient and family,  after an MRI is done, neurosurgery needs to reprogram the [facility] shunt.  Follows up with neurosurgeon Dr. Dutch Quint.  I sent a staff message to him.  ? H/o seizure Continue keppra Neurology also recommended EEG.  Mobility: Encourage ambulation  Goals of care   Code Status: Full Code    DVT prophylaxis:  enoxaparin (LOVENOX) injection 40 mg Start: 09/19/23 1000 SCDs Start: 09/19/23 0517   Antimicrobials: None Fluid: none Consultants: Neurology Family Communication: Wife at bedside  Dispo: The patient is from: Home              Anticipated d/c is to: Home after TIA workup completion  Diet: Diet Order             Diet Heart Fluid consistency: Thin  Diet effective now                    ------------------------------------------------------------------------------------- Severity of Illness: The appropriate patient status for this patient is OBSERVATION. Observation status is judged to be reasonable and necessary in order to provide the required intensity of service to ensure the patient's safety. The patient's presenting symptoms, physical exam findings, and initial radiographic and laboratory data in the context of their medical condition is felt to place them at decreased risk for further clinical deterioration. Furthermore, it is anticipated that the patient will be medically stable for discharge from the hospital within 2 midnights of admission.  -------------------------------------------------------------------------------------  Home Meds: Prior to Admission medications   Medication Sig Start Date End Date Taking? Authorizing Provider  acetaminophen (TYLENOL) 500 MG tablet Take 2 tablets (1,000 mg total) by mouth every 8 (eight) hours as needed for moderate pain. 02/03/22  Yes de Saintclair Halsted, Cortney E, NP  atorvastatin (LIPITOR) 40 MG tablet Take 1 tablet (40 mg total) by mouth daily. Patient taking differently: Take 40 mg by mouth every evening. 09/18/18  Yes Angiulli, Mcarthur Rossetti, PA-C  cetirizine (ZYRTEC) 10 MG tablet Take 10 mg by mouth every evening.   Yes [provider]  Cholecalciferol (VITAMIN D) 50 MCG (2000 UT) tablet Take 2,000 Units by mouth daily.   Yes [provider]  L-Methylfolate-B12-B6-B2 (CEREFOLIN) 04-13-49-5 MG TABS TAKE 1 TABLET BY MOUTH DAILY 08/22/23  Yes Micki Riley, MD  levETIRAcetam (KEPPRA) 500 MG tablet TAKE 1 TABLET BY MOUTH TWICE A DAY 08/21/23  Yes Micki Riley, MD  Multiple Vitamins-Minerals (MULTIVITAMIN ADULTS 50+ PO) Take 1 tablet by mouth daily.   Yes [provider]  Multiple Vitamins-Minerals (PRESERVISION AREDS 2) CAPS Take 1 capsule by mouth 2 (two) times daily.   Yes [provider]  clopidogrel (PLAVIX) 75 MG tablet Take 75 mg by mouth every evening.    [provider]    Labs on Admission:   CBC: Recent Labs  Lab 09/18/23 2135 09/18/23 2240  WBC 6.6 6.0  NEUTROABS  --  3.5  HGB 13.7 13.0  HCT 41.5 39.1  MCV 95.8 94.7  PLT 184 178    Basic Metabolic Panel: Recent Labs  Lab 09/18/23 2135  NA 138  K 4.2  CL 103  CO2 27  GLUCOSE 95  BUN 16  CREATININE 0.95  CALCIUM 8.9    Liver Function Tests: Recent Labs  Lab 09/18/23 2240  AST 34  ALT 27  ALKPHOS 39  BILITOT 0.9  PROT 6.6  ALBUMIN 3.6   No results for input(s): "LIPASE", "AMYLASE" in the last 168 hours. No results for input(s): "AMMONIA" in the last 168 hours.  Cardiac Enzymes: No results for input(s): "CKTOTAL", "CKMB", "CKMBINDEX", "TROPONINI" in the last 168 hours.  BNP (last 3 results) No results for input(s): "BNP" in the last 8760 hours.  ProBNP (last 3 results) No results for input(s): "PROBNP" in the last 8760 hours.  CBG: Recent Labs  Lab 09/19/23 0728  GLUCAP 92    Lipase  No results found for: "LIPASE"   Urinalysis    Component Value Date/Time   COLORURINE STRAW (A) 02/01/2022 1754   APPEARANCEUR CLEAR 02/01/2022 1754   LABSPEC 1.012 02/01/2022 1754   PHURINE 7.0  02/01/2022 1754   GLUCOSEU NEGATIVE 02/01/2022 1754   GLUCOSEU NEGATIVE 03/28/2010 0845   HGBUR NEGATIVE 02/01/2022 1754   HGBUR negative 10/29/2008 0906   BILIRUBINUR NEGATIVE 02/01/2022 1754   BILIRUBINUR neg 04/15/2013 0927   KETONESUR NEGATIVE 02/01/2022 1754   PROTEINUR NEGATIVE 02/01/2022 1754   UROBILINOGEN 0.2 04/15/2013 0927   UROBILINOGEN 0.2 03/28/2010 0845   NITRITE NEGATIVE 02/01/2022 1754   LEUKOCYTESUR NEGATIVE 02/01/2022 1754     Drugs of Abuse     Component Value Date/Time   LABOPIA NONE DETECTED 02/01/2022 1754   COCAINSCRNUR NONE DETECTED 02/01/2022 1754   LABBENZ NONE DETECTED 02/01/2022 1754   AMPHETMU NONE DETECTED 02/01/2022 1754   THCU NONE DETECTED 02/01/2022 1754   LABBARB NONE DETECTED 02/01/2022 1754      Radiological Exams on Admission: CT ANGIO HEAD NECK W WO CM  Result Date: 09/19/2023 CLINICAL DATA:  67 year old male with a history of ventricular shunt. Aphasia and facial droop onset yesterday. EXAM: CT ANGIOGRAPHY HEAD AND NECK WITH AND WITHOUT CONTRAST TECHNIQUE: Multidetector CT imaging of the head and neck was performed using the standard protocol during bolus administration of intravenous contrast. Multiplanar CT image reconstructions and MIPs were obtained to evaluate the vascular anatomy. Carotid stenosis measurements (when applicable) are obtained utilizing NASCET criteria, using the distal internal carotid diameter as the denominator. RADIATION DOSE REDUCTION: This exam was performed according to the departmental dose-optimization program which includes automated exposure control, adjustment of the mA and/or kV according to patient size and/or use of iterative reconstruction technique. CONTRAST:  75mL OMNIPAQUE IOHEXOL 350 MG/ML SOLN COMPARISON:  Head CT 2131 hours yesterday. Prior brain MRI 02/02/2022, prior CTA head and neck 02/01/2022. FINDINGS: CT HEAD Brain: Left posterior approach ventriculostomy shunt is stable. No ventriculomegaly. Chronic  cerebral white matter disease and multifocal cortical encephalomalacia, which might be related to previous shunt tracks, stable. Chronic posterior limb internal capsule and thalamic heterogeneity on the left is stable. No acute intracranial hemorrhage identified. No intracranial mass effect or midline shift. No acute cortically based infarct identified. Calvarium and skull base: Chronic right hemisphere burr holes. Left posterior burr hole. No acute osseous abnormality identified. Paranasal sinuses: Visualized paranasal sinuses and mastoids are stable and well aerated. Orbits: No gaze deviation. Stable left scalp convexity shunt reservoir and tubing.  CTA NECK Skeleton: No acute osseous abnormality identified. Upper chest: The left side shunt tubing tracks into the left internal jugular vein and to the confluence of the innominate veins and SVC as before. Negative visible upper lungs and superior mediastinum. Other neck: Stable from last year, no acute finding. Aortic arch: Tortuous aortic arch with mild calcified atherosclerosis. Three vessel arch configuration. Right carotid system: Mildly tortuous brachiocephalic artery and right CCA origin with no plaque or stenosis. Soft and calcified plaque at the right ICA origin, proximal bulb, with less than 50 % stenosis with respect to the distal vessel has not significantly changed. Tortuous right ICA just below the skull base. Left carotid system: Minimal plaque at the left CCA origin without stenosis. Mild calcified plaque at the left ICA origin without stenosis. Vertebral arteries: Soft and calcified plaque at the right subclavian artery origin is stable without stenosis. Right vertebral origin is normal. Right vertebral artery appears dominant, patent to the skull base with no plaque or stenosis. Proximal left subclavian artery atherosclerosis is stable and mild. Left vertebral artery origin remains normal. Tortuous left V1 segment. Non dominant left vertebral artery  is stable, patent to the skull base with no plaque or stenosis. CTA HEAD Posterior circulation: Distal vertebral arteries and vertebrobasilar junction are patent with dominant right V4. Mild calcified plaque at the vertebrobasilar junction without stenosis. Normal left PICA and dominant appearing right AICA origins. Patent basilar artery with mild irregularity but no significant stenosis. Patent SCA and right PCA origins. Fetal type left PCA origin again noted. Right posterior communicating artery diminutive or absent. Left PCA branches are stable and within normal limits. Right PCA branches are stable and within normal limits. Anterior circulation: Both ICA siphons are patent. Normal left posterior communicating artery and no significant left siphon plaque or stenosis. Minimal right siphon calcified plaque without stenosis. Patent carotid termini. Patent MCA and ACA origins. Dominant right A1. Anterior communicating artery and bilateral ACA branches are stable and within normal limits. Left MCA M1 segment and bifurcation are mildly ectatic without stenosis. Mild calcified plaque at the right MCA origin, no stenosis. Mildly ectatic right M1 and bifurcation without stenosis. Bilateral MCA branches are stable and within normal limits. Venous sinuses: Early contrast timing, grossly patent. Anatomic variants: Dominant right vertebral artery. Fetal type left PCA origin. Dominant right ACA A1. Review of the MIP images confirms the above findings IMPRESSION: 1. No large vessel occlusion. Stable CTA Head and Neck since last year: Generally mild atherosclerosis in the head and neck. No hemodynamically significant stenosis identified. 2. Stable CT appearance of the brain. No acute intracranial abnormality. 3.  Aortic Atherosclerosis (ICD10-I70.0). Electronically Signed   By: Odessa Fleming M.D.   On: 09/19/2023 05:59   CT Head Wo Contrast  Result Date: 09/18/2023 CLINICAL DATA:  Right facial droop and expressive aphasia since  6:30 p.m. EXAM: CT HEAD WITHOUT CONTRAST TECHNIQUE: Contiguous axial images were obtained from the base of the skull through the vertex without intravenous contrast. RADIATION DOSE REDUCTION: This exam was performed according to the departmental dose-optimization program which includes automated exposure control, adjustment of the mA and/or kV according to patient size and/or use of iterative reconstruction technique. COMPARISON:  02/02/2022, 02/01/2022 FINDINGS: Brain: Stable ventriculostomy catheter via left parietal approach, tip in the region of the right thalamus. Stable right frontal encephalomalacia likely from prior catheter placement. Chronic hypodensities are again seen throughout the periventricular white matter, which may reflect chronic small vessel ischemic change. No evidence of acute  infarct or hemorrhage. Lateral ventricles and midline structures are stable. No acute extra-axial fluid collections. No mass effect. Vascular: No hyperdense vessel or unexpected calcification. Stable atherosclerosis. Skull: Stable postsurgical changes are seen within the right frontal and bilateral parietal regions of the calvarium. No acute or destructive bony abnormality. Sinuses/Orbits: No acute finding. Other: None. IMPRESSION: 1. No acute intracranial process. 2. Stable ventriculostomy catheter.  No evidence of hydrocephalus. Electronically Signed   By: Sharlet Salina M.D.   On: 09/18/2023 22:34     Signed, Lorin Glass, MD [facility] 09/19/2023

## 2023-09-19 NOTE — Evaluation (Signed)
Physical Therapy Evaluation Patient Details Name: Charles Marquez MRN: [id number] DOB: 1956/09/01 Today's Date: 09/19/2023  History of Present Illness  Pt is a 66 y/o M admitted on 09/18/23 after presenting with c/c of aphasia that lasted around 30 minutes & have since resolved. PMH: hydrocephalus, VA shunt, multiple prior instances of meningitis, HLD, strokes, TIAs  Clinical Impression  Pt seen for PT evaluation with pt agreeable to tx, wife present in room. Pt reports prior to admission he was independent without AD, denies falls. On this date, pt is able to complete bed mobility & transfers without assistance. Pt ambulates in hallway with CGA without AD with increased gait speed despite cuing for decreased speed for increased safety. Pt experiences 1 LOB with head turns. Pt would benefit from PT services to address balance & safety with mobility.        If plan is discharge home, recommend the following: Assistance with cooking/housework   Can travel by private vehicle        Equipment Recommendations None recommended by PT  Recommendations for Other Services       Functional Status Assessment Patient has had a recent decline in their functional status and demonstrates the ability to make significant improvements in function in a reasonable and predictable amount of time.     Precautions / Restrictions Precautions Precautions: Fall Precaution Comments: unsteady on feet at baseline but no falls and no LOB. slightly usteady this am. Restrictions Weight Bearing Restrictions: No      Mobility  Bed Mobility Overal bed mobility: Modified Independent Bed Mobility: Supine to Sit     Supine to sit: Modified independent (Device/Increase time), HOB elevated, Used rails          Transfers Overall transfer level: Independent Equipment used: None                    Ambulation/Gait Ambulation/Gait assistance: Contact guard assist Gait Distance (Feet):  (>150 ft)    Gait Pattern/deviations: Decreased weight shift to right Gait velocity: Increased despite multiple cues/PT attempts to encourage pt to slow down for increased balance     General Gait Details: Pt ambulates around ED without AD but with increased gait speed to almost an unsafe speed despite ongoing cuing/education to slow down. Pt engaged in stops/starts, speed changes & head turns with 1 LOB with head turns.  Stairs            Wheelchair Mobility     Tilt Bed    Modified Rankin (Stroke Patients Only)       Balance     Sitting balance-Leahy Scale: Good     Standing balance support: During functional activity, No upper extremity supported Standing balance-Leahy Scale: Fair                               Pertinent Vitals/Pain Pain Assessment Pain Assessment: No/denies pain    Home Living Family/patient expects to be discharged to:: Private residence Living Arrangements: Spouse/significant other Available Help at Discharge: Family;Available 24 hours/day Type of Home: House Home Access: Stairs to enter       Home Layout: Multi-level Home Equipment: None Additional Comments: Pt is very familiar with therapy and wife is very involved in his care.  Pt appears to be at baseline.    Prior Function Prior Level of Function : Independent/Modified Independent             Mobility Comments:  denies falls ADLs Comments: independent in all but driving bc of fluctuating vision due to shunt.     Extremity/Trunk Assessment   Upper Extremity Assessment Upper Extremity Assessment: Overall WFL for tasks assessed    Lower Extremity Assessment Lower Extremity Assessment: Overall WFL for tasks assessed    Cervical / Trunk Assessment Cervical / Trunk Assessment: Normal  Communication   Communication Communication: No apparent difficulties  Cognition Arousal: Alert Behavior During Therapy: WFL for tasks assessed/performed, Impulsive                                    General Comments: Pt has been impaired since surgery 5 years ago for shunt.  Pt with STM deficits but has good insight into deficits.        General Comments General comments (skin integrity, edema, etc.): Pt appears to be at baseline for adls.    Exercises     Assessment/Plan    PT Assessment Patient needs continued PT services  PT Problem List Decreased balance;Decreased safety awareness       PT Treatment Interventions Therapeutic exercise;Gait training;Balance training;DME instruction;Stair training;Functional mobility training;Therapeutic activities;Patient/family education;Neuromuscular re-education    PT Goals (Current goals can be found in the Care Plan section)  Acute Rehab PT Goals Patient Stated Goal: start OPPT PT Goal Formulation: With patient/family Time For Goal Achievement: 10/03/23 Potential to Achieve Goals: Good Additional Goals Additional Goal #1: Pt will score 52/56 on Berg Balance Test to demonstrate reduced fall risk with mobility.    Frequency Min 1X/week     Co-evaluation               AM-PAC PT "6 Clicks" Mobility  Outcome Measure Help needed turning from your back to your side while in a flat bed without using bedrails?: None Help needed moving from lying on your back to sitting on the side of a flat bed without using bedrails?: None Help needed moving to and from a bed to a chair (including a wheelchair)?: None Help needed standing up from a chair using your arms (e.g., wheelchair or bedside chair)?: None Help needed to walk in hospital room?: A Little Help needed climbing 3-5 steps with a railing? : A Little 6 Click Score: 22    End of Session   Activity Tolerance: Patient tolerated treatment well Patient left: in bed;with nursing/sitter in room;with family/visitor present Nurse Communication: Mobility status PT Visit Diagnosis: Unsteadiness on feet (R26.81);Other abnormalities of gait and mobility  (R26.89)    Time: 1610-9604 PT Time Calculation (min) (ACUTE ONLY): 9 min   Charges:   PT Evaluation $PT Eval Low Complexity: 1 Low   PT General Charges $$ ACUTE PT VISIT: 1 Visit         Aleda Grana, PT, DPT 09/19/23, 1:24 PM   Sandi Mariscal 09/19/2023, 1:23 PM

## 2023-09-19 NOTE — Procedures (Signed)
Patient Name: Charles Marquez  MRN: [id number]  Epilepsy Attending: Charlsie Quest  Referring Physician/Provider: Erick Blinks, MD  Date: 09/19/2023 Duration: 22.24 mins  Patient history: 67 y.o. male with hx of HLD, strokes, TIAs, prior hydrocephalus s/p shunt and complicated by prior episodes of meningitis, on keppra for episodes that were presumed to be seizures who presents with about 20 mins episode of word finding difficulty and slight R facial droop. EEG to evaluate for seizure  Level of alertness: Awake  AEDs during EEG study: LEV  Technical aspects: This EEG study was done with scalp electrodes positioned according to the 10-20 International system of electrode placement. Electrical activity was reviewed with band pass filter of 1-70Hz , sensitivity of 7 uV/mm, display speed of 56mm/sec with a 60Hz  notched filter applied as appropriate. EEG data were recorded continuously and digitally stored.  Video monitoring was available and reviewed as appropriate.  Description: The posterior dominant rhythm consists of 9 Hz activity of moderate voltage (25-35 uV) seen predominantly in posterior head regions, symmetric and reactive to eye opening and eye closing. Physiologic photic driving was not seen during photic stimulation.  Hyperventilation was not performed.     IMPRESSION: This study is within normal limits. No seizures or epileptiform discharges were seen throughout the recording.  A normal interictal EEG does not exclude the diagnosis of epilepsy.  [place] Annabelle Harman

## 2023-09-24 LAB — MISC LABCORP TEST (SEND OUT): Labcorp test code: 125536

## 2023-10-29 ENCOUNTER — Telehealth: Payer: Self-pay | Admitting: Neurology

## 2023-10-29 MED ORDER — LEVETIRACETAM 750 MG PO TABS: 750.0000 mg | ORAL_TABLET | Freq: Two times a day (BID) | ORAL | 5 refills | Status: DC

## 2023-10-29 NOTE — Telephone Encounter (Signed)
Wife states pt was in the hospital in Nov and as a result pt was told to f/u in 8 weeks, wife was reminded pt was just seen in Oct of this year.  Wife states pt Is feeling fine, she would like to know if pt still needs to schedule another f/u since he was just seen in Oct and is feeling fine.

## 2023-10-29 NOTE — Telephone Encounter (Signed)
Pt is requesting a refill for levETIRAcetam (KEPPRA) 750 MG tablet.  Pharm[facility]: [phone]

## 2023-10-29 NOTE — Telephone Encounter (Signed)
Refill sent for the patient as requested

## 2023-10-31 NOTE — Telephone Encounter (Signed)
Pt has been r/s and would like his medication to be called in.

## 2023-10-31 NOTE — Telephone Encounter (Signed)
Refill has been sent for the patient 

## 2023-12-24 ENCOUNTER — Telehealth: Payer: Self-pay | Admitting: Neurology

## 2023-12-24 NOTE — Telephone Encounter (Signed)
 Pt left a VM because he has been unable to fill his rx for baby Asprin at his pharmacy ([facility]). Would like a call back.

## 2023-12-24 NOTE — Telephone Encounter (Signed)
 Spoke to wife (checked DPR) Per wife pt is no longer on plavix  and Dr Janett Medin placed pt on baby aspirin  daily . Informed wife [facility] 81 mg Aspirin   (baby Aspirin )  for $1 . Made wife aware really didn't need rx for Asprin. Wife expressed understanding and thanked me for calling

## 2024-02-06 DIAGNOSIS — H3320 Serous retinal detachment, unspecified eye: Secondary | ICD-10-CM

## 2024-02-06 HISTORY — PX: RETINAL DETACHMENT SURGERY: SHX105

## 2024-02-06 HISTORY — DX: Serous retinal detachment, unspecified eye: H33.20

## 2024-03-24 ENCOUNTER — Ambulatory Visit (INDEPENDENT_AMBULATORY_CARE_PROVIDER_SITE_OTHER): Payer: Medicare HMO | Admitting: Neurology

## 2024-03-24 ENCOUNTER — Encounter: Payer: Self-pay | Admitting: Neurology

## 2024-03-24 VITALS — BP 148/92 | HR 54 | Ht 71.0 in | Wt 206.0 lb

## 2024-03-24 DIAGNOSIS — G3184 Mild cognitive impairment, so stated: Secondary | ICD-10-CM

## 2024-03-24 DIAGNOSIS — R4789 Other speech disturbances: Secondary | ICD-10-CM | POA: Diagnosis not present

## 2024-03-24 MED ORDER — LEVETIRACETAM 750 MG PO TABS: 750.0000 mg | ORAL_TABLET | Freq: Two times a day (BID) | ORAL | 5 refills | Status: DC

## 2024-03-24 NOTE — Progress Notes (Signed)
 [facility] [address] [place]. [place] [address] [phone]       OFFICE FOLLOW-UP VISIT NOTE  [name] Date of Birth:  December 27, 1955 Medical Record Number:  [id number]   Referring MD: Consuelo Denmark  Reason for Referral: TIA versus seizure  HPI: Initial visit 04/25/2022 [name] is a 68 year old pleasant Caucasian male seen today for initial office consultation visit.  He is accompanied by his wife.  History is obtained from them and review of electronic medical records and I personally reviewed pertinent available imaging films in PACS.  He has past medical history for TIA, hydrocephalus s/p VP shunt, hyperlipidemia and anxiety.  He presented on 02/01/2022 with sudden onset of aphasia and speech difficulties.  NIH stroke scale was 5.  He was given IV thrombolysis with TNK.  Admitted to the ICU for close monitoring.  Blood pressure adequately controlled.  CT scan showed no acute abnormality and stable VP shunt and old right frontal infarct.  CT angiogram of the head and neck showed no large vessel stenosis or occlusion.  MRI scan of the brain showed no acute infarct though there were artifacts in the left parietal region from VP shunt.  2D echo showed ejection fraction of 60 to 65% without cardiac source of embolism.  LDL cholesterol was 76 mg percent and hemoglobin A1c was 5.3.  EEG showed focal cortical dysfunction in the left temporoparietal region but no definite epileptiform activity.  Urine drug screen was negative.  Patient was started on Keppra  trial for seizure prophylaxis and since he has had several episodes of transient speech disturbance negative brain imaging.  Patient has previous episode of transient left body paresthesias in 2009.  In December 2015 he was admitted for right-sided face and finger tingling as well as difficulty speaking and garbled speech.  CT head was unremarkable MRI also did not show any acute stroke.  MRI brain and carotid Dopplers  and echocardiogram was all unremarkable.  In March 2018 he was admitted for episode of right hand numbness and difficulty speaking again again neurovascular imaging and cardiac monitoring was unremarkable.  There was a concern for A-fib on telemetry hence he had a 30-day heart monitor which was negative for A-fib subsequently had loop recorder and so far paroxysmal A-fib has not yet been found.  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 Gwendlyn Lemmings.  Patient has not been working since 2019 some mild residual cognitive difficulties multiple shunt infections and surgeries.  Patient has remained on Plavix  which is tolerating well with only minor bruising and no bleeding.  His blood pressure is well controlled usually and today it is 134/80.  Is also on Lipitor which is tolerating well without muscle aches and pains.  Patient is having mild short-term memory and cognitive difficulties but these appear not to be progressive.  Has not had specific cognitive evaluation for this.  On Mini-Mental status exam today scored 27/30 and clock drawing 4/4 and was able to recall 16 animals which can walk on 4 legs. Update 08/14/2023 : He returns for follow-up after last visit in June 2023.  He is accompanied by his wife.  He states he is doing well.  He has had no further episodes of transient speech difficulties or confusion since starting Keppra .  He remains on 5 mg twice daily which is tolerating well without any side effects.  He  is tolerating Plavix  well without bruising or bleeding.  He has had no recurrent stroke or TIA symptoms.  He also states his memory loss and cognitive difficulties appear unchanged.  He does do daily puzzles solving as well as verbal.  He does take Cerefolin NAC daily as well.  Fully independent in all activitiess of daily living.  He is quite active and exercise regularly.  He has no new  complaints. Update 03/24/2024 : He returns for follow-up after last visit 6 months ago.  Patient had another episode of speech disturbance on 09/19/2023 who presented then with 20-minute episode of word finding difficulty and transient right facial droop.  By the time he was seen in the ER symptoms had resolved.  MRI was negative for acute stroke and EEG was normal.  He had previously had similar episodes in 2009, 2015, 2018 and 2023 with negative neuroimaging studies.  Patient on Keppra  5 mg twice daily and dose was increased to 750 twice daily.  Patient has been tolerating this dose well without any recurrent episodes or side effects.  He continues to have mild short-term memory and cognitive difficulties which he and his wife both feel are unchanged however on Mini-Mental status exam testing today he scored 28/30 which is little less than last visit when he had scored 30/30.  He has no new complaints ROS:   14 system review of systems is positive for confusion, disorientation, memory difficulties, cognitive impairment and all other systems negative  PMH:  Past Medical History:  Diagnosis Date   Anxiety    Hypercholesteremia    Retinal detachment 02/06/2024   right   Stroke (HCC)    tia's   TIA (transient ischemic attack)    09.15    Social History:  Social History   Socioeconomic History   Marital status: Married    Spouse name: Not on file   Number of children: 2   Years of education: MASTERS   Highest education level: Not on file  Occupational History   Not on file  Tobacco Use   Smoking status: Never   Smokeless tobacco: Never  Vaping Use   Vaping status: Never Used  Substance and Sexual Activity   Alcohol  use: Yes    Comment: maybe twice a year   Drug use: No   Sexual activity: Not on file  Other Topics Concern   Not on file  Social History Narrative   Patient is married with 2 children.   Patient is right handed.   Patient has a Master's degree.   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   Update 03/24/2024 pt disabled    Social Drivers of Health   Financial Resource Strain: Low Risk  (12/07/2023)   Received from [facility]   Overall Financial Resource Strain (CARDIA)    Difficulty of Paying Living Expenses: Not hard at all  Food Insecurity: No Food Insecurity (12/07/2023)   [facility] from [facility]   Hunger Vital Sign    Worried About Running Out of Food in the Last Year: Never true    Ran Out of Food in the Last Year: Never true  Transportation Needs: No Transportation Needs (12/07/2023)   Received from [facility] - Transportation    Lack of Transportation (Medical): No    Lack of Transportation (Non-Medical): No  Physical Activity: Sufficiently Active (10/28/2023)   Received from [facility]  Exercise Vital Sign    Days of Exercise per Week: 7 days    Minutes of Exercise per Session: 60 min  Stress: No Stress Concern Present (02/06/2024)   Received from [facility] - Occupational Stress Questionnaire    Feeling of Stress : Not at all  Social Connections: Socially Integrated (10/28/2023)   Received from [facility]   Social Network    How would you rate your social network (family, work, friends)?: Good participation with social networks  Intimate Partner Violence: Not At Risk (10/28/2023)   Received from [facility]   HITS    Over the last 12 months how often did your partner physically hurt you?: Never    Over the last 12 months how often did your partner insult you or talk down to you?: Never    Over the last 12 months how often did your partner threaten you with physical harm?: Never    Over the last 12 months how often did your partner scream or curse at you?: Never    Medications:   Current Outpatient Medications on File Prior to Visit  Medication Sig Dispense Refill   acetaminophen  (TYLENOL )  500 MG tablet Take 2 tablets (1,000 mg total) by mouth every 8 (eight) hours as needed for moderate pain. 30 tablet 0   aspirin  EC 81 MG tablet Take 81 mg by mouth daily. Swallow whole.     atorvastatin  (LIPITOR) 40 MG tablet Take 1 tablet (40 mg total) by mouth daily. (Patient taking differently: Take 40 mg by mouth every evening.) 90 tablet 3   Cholecalciferol  (VITAMIN D ) 50 MCG (2000 UT) tablet Take 2,000 Units by mouth daily.     L-Methylfolate-B12-B6-B2 (CEREFOLIN) 04-13-49-5 MG TABS TAKE 1 TABLET BY MOUTH DAILY 90 tablet 3   Multiple Vitamins-Minerals (MULTIVITAMIN ADULTS 50+ PO) Take 1 tablet by mouth daily.     Multiple Vitamins-Minerals (PRESERVISION AREDS 2) CAPS Take 1 capsule by mouth 2 (two) times daily.     cetirizine (ZYRTEC) 10 MG tablet Take 10 mg by mouth as needed. (Patient not taking: Reported on 03/24/2024)     No current facility-administered medications on file prior to visit.    Allergies:  No Known Allergies  Physical Exam General: well developed, well nourished middle-aged Caucasian male, seated, in no evident distress Head: head normocephalic and atraumatic.   Neck: supple with no carotid or supraclavicular bruits Cardiovascular: regular rate and rhythm, no murmurs Musculoskeletal: no deformity Skin:  no rash/petichiae Vascular:  Normal pulses all extremities  Neurologic Exam Mental Status: Awake and fully alert. Oriented to place and time. Recent and remote memory intact. Attention span, concentration and fund of knowledge appropriate. Mood and affect appropriate.  Diminished recall.  Mini-Mental status exam score 28/30 with deficits in recall.  Able to name 15 animals which can walk on 4 legs.  Clock drawing 3/4. Cranial Nerves: Fundoscopic exam reveals sharp disc margins. Pupils equal, briskly reactive to light. Extraocular movements full without nystagmus. Visual fields full to confrontation. Hearing intact. Facial sensation intact. Face, tongue, palate moves  normally and symmetrically.  Motor: Normal bulk and tone. Normal strength in all tested extremity muscles. Sensory.: intact to touch , pinprick , position and vibratory sensation.  Coordination: Rapid alternating movements normal in all extremities. Finger-to-nose and heel-to-shin performed accurately bilaterally. Gait and Station: Arises from chair without difficulty. Stance is normal. Gait demonstrates normal stride length and balance . Able to heel, toe and tandem walk  without difficulty.  Reflexes: 1+ and symmetric. Toes downgoing.       03/24/2024    2:17 PM 08/14/2023    2:57 PM 04/25/2022    9:04 AM  MMSE - Mini Mental State Exam  Orientation to time 5 5 4   Orientation to Place 5 5 5   Registration 3 3 3   Attention/ Calculation 3 5 4   Recall 3 3 2   Language- name 2 objects 2 2 2   Language- repeat 1 1 1   Language- follow 3 step command 3 3 3   Language- read & follow direction 1 1 1   Write a sentence 1 1 1   Copy design 1 1 1   Total score 28 30 27        ASSESSMENT: 68 year old Caucasian male with episode of confusion disorientation and speech difficulties likely strokelike episode treated with IV TNK but negative brain imaging.  He also has memory difficulties due to mild cognitive impairment which appears stable.  Patient has had multiple episodes of transient speech disturbance with negative brain imaging hence is on a empirical trial of Keppra  for seizure prophylaxis .     PLAN:I had a long discussion with the patient and his wife regarding the episodes of transient speech disturbance and mild cognitive impairment and I recommend he continue Keppra  750 mg twice daily which is him to be tolerating well and has not had any episodes now for 6 months.  Continue participating in cognitively challenging activities like solving crossword puzzles, playing bridge and sudoku.  We also discussed memory compensation strategies.  Continue aspirin  for stroke prevention and maintain aggressive  risk factor modification.  He will return for follow-up in the future with my nurse practitioner in 6 months or call earlier if necessary..   Greater than 50% time during this 35-minute  visit were spent on counseling and coordination of care about his episode of confusion disorientation as well as memory loss and mild cognitive impairment and answering questions  Ardella Beaver, MD Note: This document was prepared with digital dictation and possible smart phrase technology. Any transcriptional errors that result from this process are unintentional.

## 2024-03-24 NOTE — Patient Instructions (Signed)
 I had a long discussion with the patient and his wife regarding the episodes of transient speech disturbance and mild cognitive impairment and I recommend he continue Keppra  750 mg twice daily which is him to be tolerating well and has not had any episodes now for 6 months.  Continue participating in cognitively challenging activities like solving crossword puzzles, playing bridge and sudoku.  We also discussed memory compensation strategies.  He will return for follow-up in the future with my nurse practitioner in 6 months or call earlier if necessary.  Memory Compensation Strategies  Use "WARM" strategy.  W= write it down  A= associate it  R= repeat it  M= make a mental note  2.   You can keep a Glass blower/designer.  Use a 3-ring notebook with sections for the following: calendar, important names and phone numbers,  medications, doctors' names/phone numbers, lists/reminders, and a section to journal what you did  each day.   3.    Use a calendar to write appointments down.  4.    Write yourself a schedule for the day.  This can be placed on the calendar or in a separate section of the Memory Notebook.  Keeping a  regular schedule can help memory.  5.    Use medication organizer with sections for each day or morning/evening pills.  You may need help loading it  6.    Keep a basket, or pegboard by the door.  Place items that you need to take out with you in the basket or on the pegboard.  You may also want to  include a message board for reminders.  7.    Use sticky notes.  Place sticky notes with reminders in a place where the task is performed.  For example: " turn off the  stove" placed by the stove, "lock the door" placed on the door at eye level, " take your medications" on  the bathroom mirror or by the place where you normally take your medications.  8.    Use alarms/timers.  Use while cooking to remind yourself to check on food or as a reminder to take your medicine, or as a  reminder  to make a call, or as a reminder to perform another task, etc.

## 2024-08-13 ENCOUNTER — Ambulatory Visit: Payer: Medicare HMO | Admitting: Neurology

## 2024-08-29 ENCOUNTER — Encounter: Payer: Self-pay | Admitting: Neurology

## 2024-09-01 ENCOUNTER — Other Ambulatory Visit: Payer: Self-pay | Admitting: *Deleted

## 2024-09-01 MED ORDER — CEREFOLIN 6-1-50-5 MG PO TABS: 1.0000 | ORAL_TABLET | Freq: Every day | ORAL | 0 refills | Status: DC

## 2024-10-22 ENCOUNTER — Ambulatory Visit: Admitting: Adult Health

## 2024-10-22 ENCOUNTER — Encounter: Payer: Self-pay | Admitting: Adult Health

## 2024-10-22 VITALS — BP 131/79 | HR 69 | Ht 71.0 in | Wt 203.0 lb

## 2024-10-22 DIAGNOSIS — R4789 Other speech disturbances: Secondary | ICD-10-CM | POA: Diagnosis not present

## 2024-10-22 DIAGNOSIS — R251 Tremor, unspecified: Secondary | ICD-10-CM

## 2024-10-22 DIAGNOSIS — G3184 Mild cognitive impairment, so stated: Secondary | ICD-10-CM

## 2024-10-22 DIAGNOSIS — R293 Abnormal posture: Secondary | ICD-10-CM | POA: Diagnosis not present

## 2024-10-22 DIAGNOSIS — Z8673 Personal history of transient ischemic attack (TIA), and cerebral infarction without residual deficits: Secondary | ICD-10-CM

## 2024-10-22 DIAGNOSIS — R2689 Other abnormalities of gait and mobility: Secondary | ICD-10-CM | POA: Diagnosis not present

## 2024-10-22 MED ORDER — LEVETIRACETAM 750 MG PO TABS: 750.0000 mg | ORAL_TABLET | Freq: Two times a day (BID) | ORAL | 3 refills | Status: AC

## 2024-10-22 MED ORDER — CEREFOLIN 6-1-50-5 MG PO TABS: 1.0000 | ORAL_TABLET | Freq: Every day | ORAL | 3 refills | Status: AC

## 2024-10-22 NOTE — Progress Notes (Signed)
 [facility] [address] [place]. [place] [address] [phone]       OFFICE FOLLOW-UP VISIT NOTE  [name] Date of Birth:  03-29-1956 Medical Record Number:  [id number]    Primary neurologist: Dr. Rosemarie Reason for visit: Transient speech disturbance, MCI   Chief Complaint  Patient presents with   Memory Loss    RM 3 with daughter  Pt is well and stable, reports memory is stable, no new stroke concerns.      HPI:   Update 10/22/2024 JM: Patient returns for follow-up visit accompanied by his daughter.  Denies any recurrent episodes of speech disturbance.  Remains on levetiracetam  750 mg twice daily without side effects.  No new stroke/TIA symptoms, reports compliance on aspirin  and atorvastatin  without side effects.  Cognition has been overall stable.  Continues on Cerefolin. He does note chronic upper extremity tremor, LUE>RUE, daughter has also noticed occasional head tremor while sitting and watching TV. He has been having issues with his balance which has been present since his hospitalization.  Daughter questions restarted PT to help with balance and overall flexibility. He ambulates without AD, no recent falls. Family suspected gait impairment more due to history of hydrocephalus requiring VP shunt in 2016.  Mentions father had history of tremor, no known family history of PD. Symptoms have been generally stable without worsening over the past several years.         History provided for reference purposes only Update 03/24/2024 Dr. Rosemarie: He returns for follow-up after last visit 6 months ago.  Patient had another episode of speech disturbance on 09/19/2023 who presented then with 20-minute episode of word finding difficulty and transient right facial droop.  By the time he was seen in the ER symptoms had resolved.  MRI was negative for acute stroke and EEG was normal.  He had previously had similar episodes in 2009, 2015, 2018 and 2023 with  negative neuroimaging studies.  Patient on Keppra  5 mg twice daily and dose was increased to 750 twice daily.  Patient has been tolerating this dose well without any recurrent episodes or side effects.  He continues to have mild short-term memory and cognitive difficulties which he and his wife both feel are unchanged however on Mini-Mental status exam testing today he scored 28/30 which is little less than last visit when he had scored 30/30.  He has no new complaints   Update 08/14/2023 Dr. Rosemarie: He returns for follow-up after last visit in June 2023.  He is accompanied by his wife.  He states he is doing well.  He has had no further episodes of transient speech difficulties or confusion since starting Keppra .  He remains on 5 mg twice daily which is tolerating well without any side effects.  He is tolerating Plavix  well without bruising or bleeding.  He has had no recurrent stroke or TIA symptoms.  He also states his memory loss and cognitive difficulties appear unchanged.  He does do daily puzzles solving as well as verbal.  He does take Cerefolin NAC daily as well.  Fully independent in all activitiess of daily living.  He is quite active and exercise regularly.  He has no new complaints.  Initial visit 04/25/2022 Dr. Rosemarie: Charles Marquez is a 68 year old pleasant Caucasian male seen today for initial office consultation visit.  He is accompanied by his wife.  History is obtained from them and review of electronic medical records and I personally reviewed pertinent available imaging films in  PACS.  He has past medical history for TIA, hydrocephalus s/p VP shunt, hyperlipidemia and anxiety.  He presented on 02/01/2022 with sudden onset of aphasia and speech difficulties.  NIH stroke scale was 5.  He was given IV thrombolysis with TNK.  Admitted to the ICU for close monitoring.  Blood pressure adequately controlled.  CT scan showed no acute abnormality and stable VP shunt and old right frontal infarct.  CT  angiogram of the head and neck showed no large vessel stenosis or occlusion.  MRI scan of the brain showed no acute infarct though there were artifacts in the left parietal region from VP shunt.  2D echo showed ejection fraction of 60 to 65% without cardiac source of embolism.  LDL cholesterol was 76 mg percent and hemoglobin A1c was 5.3.  EEG showed focal cortical dysfunction in the left temporoparietal region but no definite epileptiform activity.  Urine drug screen was negative.  Patient was started on Keppra  trial for seizure prophylaxis and since he has had several episodes of transient speech disturbance negative brain imaging.  Patient has previous episode of transient left body paresthesias in 2009.  In December 2015 he was admitted for right-sided face and finger tingling as well as difficulty speaking and garbled speech.  CT head was unremarkable MRI also did not show any acute stroke.  MRI brain and carotid Dopplers and echocardiogram was all unremarkable.  In March 2018 he was admitted for episode of right hand numbness and difficulty speaking again again neurovascular imaging and cardiac monitoring was unremarkable.  There was a concern for A-fib on telemetry hence he had a 30-day heart monitor which was negative for A-fib subsequently had loop recorder and so far paroxysmal A-fib has not yet been found.  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 Malcolm.  Patient has not been working since 2019 some mild residual cognitive difficulties multiple shunt infections and surgeries.  Patient has remained on Plavix  which is tolerating well with only minor bruising and no bleeding.  His blood pressure is well controlled usually and today it is 134/80.  Is also on Lipitor which is tolerating well without muscle aches and pains.  Patient is having mild short-term memory and cognitive difficulties but these  appear not to be progressive.  Has not had specific cognitive evaluation for this.  On Mini-Mental status exam today scored 27/30 and clock drawing 4/4 and was able to recall 16 animals which can walk on 4 legs.      ROS:   14 system review of systems is positive for those listed in HPI and all other systems negative  PMH:  Past Medical History:  Diagnosis Date   Anxiety    Hypercholesteremia    Retinal detachment 02/06/2024   right   Stroke [facility])    tia's   TIA (transient ischemic attack)    09.15    Social History:  Social History   Socioeconomic History   Marital status: Married    Spouse name: Not on file   Number of children: 2   Years of education: MASTERS   Highest education level: Not on file  Occupational History   Not on file  Tobacco Use   Smoking status: Never   Smokeless tobacco: Never  Vaping Use   Vaping status: Never Used  Substance and Sexual Activity   Alcohol  use: Yes    Comment:  maybe twice a year   Drug use: No   Sexual activity: Not on file  Other Topics Concern   Not on file  Social History Narrative   Patient is married with 2 children.   Patient is right handed.   Patient has a Master's degree.   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   Update 03/24/2024 pt disabled    Social Drivers of Health   Financial Resource Strain: Low Risk (09/14/2024)   Received from [facility]   Overall Financial Resource Strain (CARDIA)    How hard is it for you to pay for the very basics like food, housing, medical care, and heating?: Not hard at all  Food Insecurity: No Food Insecurity (09/14/2024)   Received from [facility]   Hunger Vital Sign    Within the past 12 months, you worried that your food would run out before you got the money to buy more.: Never true    Within the past 12 months, the food you bought just didn't last and you didn't have money to get more.: Never true   Transportation Needs: No Transportation Needs (09/14/2024)   Received from [facility] - Transportation    In the past 12 months, has lack of transportation kept you from medical appointments or from getting medications?: No    In the past 12 months, has lack of transportation kept you from meetings, work, or from getting things needed for daily living?: No  Physical Activity: Sufficiently Active (09/14/2024)   Received from [facility]   Exercise Vital Sign    On average, how many days per week do you engage in moderate to strenuous exercise (like a brisk walk)?: 7 days    On average, how many minutes do you engage in exercise at this level?: 40 min  Stress: No Stress Concern Present (09/14/2024)   Received from [facility] - Occupational Stress Questionnaire    Do you feel stress - tense, restless, nervous, or anxious, or unable to sleep at night because your mind is troubled all the time - these days?: Not at all  Social Connections: Socially Integrated (09/14/2024)   Received from [facility]   Social Network    How would you rate your social network (family, work, friends)?: Good participation with social networks  Intimate Partner Violence: Not At Risk (09/14/2024)   Received from [facility]   HITS    Over the last 12 months how often did your partner physically hurt you?: Never    Over the last 12 months how often did your partner insult you or talk down to you?: Never    Over the last 12 months how often did your partner threaten you with physical harm?: Never    Over the last 12 months how often did your partner scream or curse at you?: Never    Medications:   Current Outpatient Medications on File Prior to Visit  Medication Sig Dispense Refill   acetaminophen  (TYLENOL ) 500 MG tablet Take 2 tablets (1,000 mg total) by mouth every 8 (eight) hours as needed for moderate pain. 30 tablet 0   aspirin  EC 81 MG tablet Take  81 mg by mouth daily. Swallow whole.     atorvastatin  (LIPITOR) 40 MG tablet Take 1 tablet (40 mg total) by mouth daily. (Patient taking differently: Take 40 mg by mouth every  evening.) 90 tablet 3   Cholecalciferol  (VITAMIN D ) 50 MCG (2000 UT) tablet Take 2,000 Units by mouth daily.     L-Methylfolate-B12-B6-B2 (CEREFOLIN) 04-13-49-5 MG TABS Take 1 tablet by mouth daily. 90 tablet 0   levETIRAcetam  (KEPPRA ) 750 MG tablet Take 1 tablet (750 mg total) by mouth 2 (two) times daily. 60 tablet 5   Multiple Vitamins-Minerals (MULTIVITAMIN ADULTS 50+ PO) Take 1 tablet by mouth daily.     Multiple Vitamins-Minerals (PRESERVISION AREDS 2) CAPS Take 1 capsule by mouth 2 (two) times daily.     No current facility-administered medications on file prior to visit.    Allergies:  No Known Allergies  Physical Exam Today's Vitals   10/22/24 1520  BP: 131/79  Pulse: 69  Weight: 203 lb (92.1 kg)  Height: 5' 11 (1.803 m)   Body mass index is 28.31 kg/m.  General: well developed, well nourished very pleasant middle-aged Caucasian male, seated, in no evident distress  Neurologic Exam Mental Status: Awake and fully alert. Oriented to place and time. Recent memory mildly impaired and remote memory intact. Attention span, concentration and fund of knowledge appropriate. Mood and affect appropriate.  Diminished recall.  Fluent speech and language.  Mild facial masking. Cranial Nerves: Pupils equal, briskly reactive to light. Extraocular movements full without nystagmus. Visual fields full to confrontation. Hearing intact. Facial sensation intact. Face, tongue, palate moves normally and symmetrically.  Motor: Normal bulk and tone. Normal strength in all tested extremity muscles. Moderate postural tremor of LUE, mild RUE. Slight intermittent LUE resting tremor. Mild cogwheel rigidity LUE.  Sensory.: intact to touch , pinprick , position and vibratory sensation.  Coordination: Rapid alternating movements normal in  all extremities. Finger-to-nose and heel-to-shin performed accurately bilaterally. Gait and Station: Arises from chair without difficulty. Stance is normal. Gait demonstrates normal stride length with mild imbalance. Adequate step height and stride length. Decreased LUE arm swing.  Reflexes: 1+ and symmetric. Toes downgoing.       10/22/2024    3:25 PM 03/24/2024    2:17 PM 08/14/2023    2:57 PM  MMSE - Mini Mental State Exam  Orientation to time 5 5 5   Orientation to Place 5 5 5   Registration 3 3 3   Attention/ Calculation 4 3 5   Recall 3 3 3   Language- name 2 objects 2 2 2   Language- repeat 1 1 1   Language- follow 3 step command 3 3 3   Language- read & follow direction 1 1 1   Write a sentence 1 1 1   Copy design 1 1 1   Total score 29 28 30        ASSESSMENT: 68 year old Caucasian male with episode of confusion disorientation and speech difficulties likely strokelike episode treated with IV TNK but negative brain imaging.  He also has memory difficulties due to mild cognitive impairment which appears stable.  Patient has had multiple episodes of transient speech disturbance with negative brain imaging hence is on a empirical trial of Keppra  for seizure prophylaxis .     PLAN:  Transient speech disturbance TIA No recurrent episodes since 09/2023 Continue Keppra  750 mg twice daily - refills provided Routine labs by PCP Advised to call with any recurrent episodes Continue aspirin  81 mg daily and atorvastatin  40 mg daily for secondary stroke prevention managed/prescribed by PCP Continue close PCP follow-up for aggressive stroke risk factor management  Mild cognitive impairment Overall stable, MMSE today 29/30 Continue Cerefolin 1 tab daily Discussed importance of routine cognitive and physical activity, ensuring  good diet, adequate sleep and routine socialization  Chronic tremor Chronic gait impairment Hx of hydrocephalus s/p VP shunt Referral placed to PT per request to work  on balance and upper body flexibility Reports chronic tremor which has been stable over the past several years, evidence of occasional resting tremor and cogwheel rigidity of LUE, postural instability with decreased LUE arm swing. As symptoms stable, continue to monitor for now. Consider pursuing DaTscan if symptoms should progress to rule out PD Fm hx of tremor, suspected essential tremor      Follow-up in 1 year or call earlier if needed      I personally spent a total of 45 minutes in the care of the patient today including preparing to see the patient, getting/reviewing separately obtained history, performing a medically appropriate exam/evaluation, counseling and educating, placing orders, and documenting clinical information in the EHR. This is our first time meeting and time has been spent reviewing past medical history and relevant medical records.   Harlene Bogaert, AGNP-BC  [facility] [address] [place], [place] [address]  Phone [phone] Fax [phone] Note: This document was prepared with digital dictation and possible smart phrase technology. Any transcriptional errors that result from this process are unintentional.

## 2024-10-27 ENCOUNTER — Telehealth: Payer: Self-pay | Admitting: Adult Health

## 2024-10-27 NOTE — Telephone Encounter (Signed)
 Deseree @ [facility] [facility] [employer] is asking for a call to discuss questions they have about CEREFOLIN for pt

## 2024-10-29 NOTE — Telephone Encounter (Signed)
 I verbally spoke to NP, got verbal to change Rx. I contacted Joane back, he took verbal order to change Rx to Cerefolin Brain wellness. He will fill and notify pt.

## 2024-10-29 NOTE — Telephone Encounter (Signed)
 I spoke to [employer] , he stated CEREFOLIN regular is no longer in production. This is a old version on med. There is a newer form Cerefolin Brain Wellness with active ingredient NAC. They are requesting new order for this med. Advised I will get a verbal from NP and give him a call back at [phone]. He verbally understood and was appreciative.

## 2024-11-18 ENCOUNTER — Encounter: Payer: Self-pay | Admitting: Physical Therapy

## 2024-11-18 ENCOUNTER — Other Ambulatory Visit: Payer: Self-pay

## 2024-11-18 ENCOUNTER — Ambulatory Visit: Attending: Adult Health | Admitting: Physical Therapy

## 2024-11-18 DIAGNOSIS — R2681 Unsteadiness on feet: Secondary | ICD-10-CM | POA: Diagnosis present

## 2024-11-18 DIAGNOSIS — R2689 Other abnormalities of gait and mobility: Secondary | ICD-10-CM | POA: Diagnosis present

## 2024-11-18 NOTE — Therapy (Signed)
 " OUTPATIENT PHYSICAL THERAPY NEURO EVALUATION   Patient Name: Charles Marquez MRN: [id number] DOB:February 01, 1956, 69 y.o., male Today's Date: 11/18/2024   PCP: Dorcus Lamar POUR, MD  REFERRING PROVIDER:   Whitfield Raisin, NP    END OF SESSION:  PT End of Session - 11/18/24 1500     Visit Number 1    Number of Visits 13    Date for Recertification  01/02/25    Authorization Type [employer]-auth submitted    PT Start Time 1232    PT Stop Time 1315    PT Time Calculation (min) 43 min    Equipment Utilized During Treatment Gait belt    Activity Tolerance Patient tolerated treatment well    Behavior During Therapy WFL for tasks assessed/performed          Past Medical History:  Diagnosis Date   Anxiety    Hypercholesteremia    Retinal detachment 02/06/2024   right   Stroke [facility])    tia's   TIA (transient ischemic attack)    09.15   Past Surgical History:  Procedure Laterality Date   Fractured arm Left 2012   HERNIA REPAIR Right 01/2011   LAPAROSCOPIC REVISION VENTRICULAR-PERITONEAL (V-P) SHUNT N/A 08/26/2018   Procedure: LAPAROSCOPIC INSERTION VENTRICULAR-PERITONEAL (V-P) SHUNT;  Surgeon: Louis Shove, MD;  Location: [facility] OR;  Service: Neurosurgery;  Laterality: N/A;   LAPAROSCOPIC REVISION VENTRICULAR-PERITONEAL (V-P) SHUNT N/A 01/17/2019   Procedure: LAPAROSCOPIC REVISION VENTRICULAR-PERITONEAL (V-P) SHUNT;  Surgeon: Belinda Cough, MD;  Location: [facility] OR;  Service: General;  Laterality: N/A;   LOOP RECORDER INSERTION N/A 04/10/2017   Procedure: Loop Recorder Insertion;  Surgeon: Kelsie Agent, MD;  Location: [facility] INVASIVE CV LAB;  Service: Cardiovascular;  Laterality: N/A;   RETINAL DETACHMENT SURGERY Right 02/06/2024   SHUNT REMOVAL Right 03/13/2016   Procedure: SHUNT REMOVAL;  Surgeon: Shove Louis, MD;  Location: [facility] NEURO ORS;  Service: Neurosurgery;  Laterality: Right;   SHUNT REMOVAL Right 08/09/2018   Procedure: SHUNT REMOVAL With Placement of Ventricular Catheter;   Surgeon: Lanis Pupa, MD;  Location: [facility] OR;  Service: Neurosurgery;  Laterality: Right;   SHUNT REVISION Right 08/05/2018   Procedure: SHUNT REVISION;  Surgeon: Louis Shove, MD;  Location: [facility] OR;  Service: Neurosurgery;  Laterality: Right;   SHUNT REVISION Left 12/06/2018   Procedure: Shunt Revision - left;  Surgeon: Louis Shove, MD;  Location: [facility] OR;  Service: Neurosurgery;  Laterality: Left;  Shunt Revision - left   SHUNT REVISION N/A 12/09/2018   Procedure: SHUNT REVISION;  Surgeon: Louis Shove, MD;  Location: [facility] OR;  Service: Neurosurgery;  Laterality: N/A;   SHUNT REVISION Left 01/17/2019   Procedure: Shunt Revision - left;  Surgeon: Louis Shove, MD;  Location: [facility] OR;  Service: Neurosurgery;  Laterality: Left;   SHUNT REVISION Left 04/23/2019   Procedure: Left occipital VP shunt revision with placement of Ventriculo-Atrial Shunt;  Surgeon: Louis Shove, MD;  Location: [facility] OR;  Service: Neurosurgery;  Laterality: Left;   SHUNT REVISION VENTRICULAR-PERITONEAL Left 08/26/2018   Procedure: SHUNT REVISION VENTRICULAR-PERITONEAL;  Surgeon: Louis Shove, MD;  Location: [facility] OR;  Service: Neurosurgery;  Laterality: Left;   SHUNT REVISION VENTRICULAR-PERITONEAL Left 09/02/2018   Procedure: Left Occipital VP shunt revision;  Surgeon: Louis Shove, MD;  Location: [facility] OR;  Service: Neurosurgery;  Laterality: Left;   VASECTOMY  10/02/1997   VENTRICULOPERITONEAL SHUNT Right 12/18/2014   Procedure: Shunt Placment - right occipital VP shunt ;  Surgeon: Shove DELENA Louis, MD;  Location: [facility] NEURO ORS;  Service: Neurosurgery;  Laterality: Right;  Shunt Placment - right occipital VP shunt    VENTRICULOPERITONEAL SHUNT Right 07/22/2018   Procedure: Shunt Placment right occipital;  Surgeon: Louis Shove, MD;  Location: [facility] OR;  Service: Neurosurgery;  Laterality: Right;   VENTRICULOPERITONEAL SHUNT Left 08/26/2018   Procedure: LEFT SIDED VENTRICULAR-PERITONEAL SHUNT;  Surgeon: Louis Shove, MD;  Location: [facility] OR;   Service: Neurosurgery;  Laterality: Left;   VENTRICULOSTOMY Right 08/09/2018   Procedure: VENTRICULOSTOMY;  Surgeon: Lanis Pupa, MD;  Location: [facility] OR;  Service: Neurosurgery;  Laterality: Right;   Patient Active Problem List   Diagnosis Date Noted   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   Visual disturbance    Slow transit constipation    Hypoalbuminemia due to protein-calorie malnutrition    Acute blood loss anemia    S/P VP shunt    Hydrocephalus (HCC) 08/30/2018   Dyslipidemia    History of CVA (cerebrovascular accident)    Benign essential HTN    Tachycardia    Leukocytosis    Hyponatremia    Hypokalemia    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   Infection of ventricular shunt 08/09/2018   Bacterial meningitis 08/09/2018   TIA (transient ischemic attack) 01/29/2017   Acute encephalopathy    Shunt malfunction 03/13/2016   Small vessel disease, cerebrovascular 01/25/2015   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   Hyperlipidemia 10/20/2014   Degenerative disc disease, lumbar 04/15/2013   Routine general medical examination at a health care facility 07/23/2012   DISTURBANCE OF SKIN SENSATION 10/05/2008   HYPERLIPIDEMIA 01/01/2008   MYCOPLASMA PNEUMONIA 01/01/2008    ONSET DATE: 10/22/2024 MD referral  REFERRING DIAG:  R26.89 (ICD-10-CM) - Abnormality of gait due to impairment of balance    THERAPY DIAG:  Unsteadiness on feet  Other abnormalities of gait and mobility  Rationale for Evaluation and Treatment: Rehabilitation  SUBJECTIVE:                                                                                                                                                                                             SUBJECTIVE STATEMENT: Wife and daughter sent a text-to work on flexibility, agility, balance-getting up from chair no arms, getting up from floor without pulling so hard.  Recently had cataract surgery L eye.  Reports no falls. Pt accompanied  by: self  PERTINENT HISTORY: CVA/TIA; hx of hydrocephalus requiring VP shunt 2016 (with revisions), transient speech disturbances, MCI  PAIN:  Are you having pain? No  PRECAUTIONS: Fall and Other: avoid lifting 10# and bending over x 1 more week (following cataract surgery)  RED FLAGS: None   WEIGHT BEARING RESTRICTIONS: No  FALLS: Has patient fallen in last 6 months? No  LIVING ENVIRONMENT: Lives with: lives with their spouse Lives in: House/apartment Stairs: steps to enter, steps to basement and upper level Has following equipment at home: None *Wife has multiple myeloma and children ( who live nearby) assist with her care  PLOF: Independent and Leisure: enjoys walking in the neighborhood and going to see sporting events Does bird dog/quadruped exercises, floor exercises at home, marching in place  PATIENT GOALS: To improve transfers, balance, for getting on shoes, getting up from chairs, getting up from the floor  OBJECTIVE:  Note: Objective measures were completed at Evaluation unless otherwise noted.  DIAGNOSTIC FINDINGS: NA  COGNITION: Overall cognitive status: History of cognitive impairments - at baseline  Hx of MCI; some minor word finding SENSATION: Light touch: WFL  COORDINATION: Slowed alternating step taps  POSTURE: No Significant postural limitations  LOWER EXTREMITY ROM:   WFL BLEs  Active  Right Eval Left Eval  Hip flexion    Hip extension    Hip abduction    Hip adduction    Hip internal rotation    Hip external rotation    Knee flexion    Knee extension    Ankle dorsiflexion    Ankle plantarflexion    Ankle inversion    Ankle eversion     (Blank rows = not tested)  LOWER EXTREMITY MMT:  Grossly tested 4+/5 throughout; some ataxic movements in and out of positions  MMT Right Eval Left Eval  Hip flexion    Hip extension    Hip abduction    Hip adduction    Hip internal rotation    Hip external rotation    Knee flexion    Knee  extension    Ankle dorsiflexion    Ankle plantarflexion    Ankle inversion    Ankle eversion    (Blank rows = not tested)   TRANSFERS: Sit to stand: Modified independence  Assistive device utilized: None     Stand to sit: Modified independence  Assistive device utilized: None      GAIT: Findings: Gait Characteristics: step through pattern, decreased arm swing- Left, and wide BOS, Distance walked: 50 ft, Assistive device utilized:None, and Level of assistance: Modified independence and SBA  FUNCTIONAL TESTS:  5 times sit to stand: 10.16 sec with slight tendency for retropulsion Timed up and go (TUG): 9.56 sec 10 meter walk test: 7.03 sec SLS:  LLE 1.13 sec, 4.84 sec; RLE 1.29 sec, 1.03 sec 3 M walk backwards:  4.37 sec Tandem stance:  1.31 sec RLE posterior, 10.75 sec LLE posterior FGA:  20/30   [facility] PT Assessment - 11/18/24 0001       Functional Gait  Assessment   Gait assessed  Yes    Gait Level Surface Walks 20 ft in less than 5.5 sec, no assistive devices, good speed, no evidence for imbalance, normal gait pattern, deviates no more than 6 in outside of the 12 in walkway width.   4.4   Change in Gait Speed Able to smoothly change walking speed without loss of balance or gait deviation. Deviate no more than 6 in outside of the 12 in walkway width.    Gait with Horizontal Head Turns Performs head turns smoothly with slight change in gait velocity (eg, minor disruption to smooth gait path), deviates 6-10 in outside 12 in walkway width, or uses an assistive device.   5.5 veering   Gait with Vertical Head Turns Performs task with  slight change in gait velocity (eg, minor disruption to smooth gait path), deviates 6 - 10 in outside 12 in walkway width or uses assistive device    Gait and Pivot Turn Pivot turns safely in greater than 3 sec and stops with no loss of balance, or pivot turns safely within 3 sec and stops with mild imbalance, requires small steps to catch balance.    Step Over  Obstacle Is able to step over one shoe box (4.5 in total height) without changing gait speed. No evidence of imbalance.    Gait with Narrow Base of Support Ambulates less than 4 steps heel to toe or cannot perform without assistance.    Gait with Eyes Closed Walks 20 ft, slow speed, abnormal gait pattern, evidence for imbalance, deviates 10-15 in outside 12 in walkway width. Requires more than 9 sec to ambulate 20 ft.   8 sec veers to R   Ambulating Backwards Walks 20 ft, uses assistive device, slower speed, mild gait deviations, deviates 6-10 in outside 12 in walkway width.    Steps Alternating feet, no rail.    Total Score 20    FGA comment: Scores <22/30 indicate increased fall risk          PATIENT SURVEY: Activities of Balance Confidence Scale (ABC):  67.5%                                                                                                                              TREATMENT DATE: 11/18/2024    PATIENT EDUCATION: Education details: PT eval results, POC  Person educated: Patient Education method: Explanation Education comprehension: verbalized understanding  HOME EXERCISE PROGRAM: TBD  GOALS: Goals reviewed with patient? Yes  SHORT TERM GOALS: Target date: 12/05/2024  Pt will be independent with HEP for improved balance, gait. Baseline: Goal status: INITIAL  2.  Pt will perform FTSTS test <10 seconds with no retropulsion, for improved strength and balance. Baseline:  Goal status: INITIAL   LONG TERM GOALS: Target date: 01/02/2025  Pt will be independent with HEP for improved balance, gait.  Baseline:  Goal status: INITIAL  2.  Pt will verbalize understanding of fall prevention in home environment.  Baseline:  Goal status: INITIAL  3.  Pt will improve FGA score to at least 25/30 to decrease fall risk. Baseline: 20/30 Goal status: INITIAL  4.  SLS to improve to at least 3 sec each leg, for improved ADLS and stair/obstacle/curb  negotiation. Baseline:  Goal status: INITIAL  5.  Pt will perform floor>stand transfer, mod I for improved functional mobility and independence. Baseline:  Goal status: INITIAL  ASSESSMENT:  CLINICAL IMPRESSION: Patient is a 69 y.o. male who was seen today for physical therapy evaluation and treatment for R26.89 (ICD-10-CM) - Abnormality of gait due to impairment of balance.  He has history of CVA/TIA as well as shunt placement/revision due to hydrocephalus; he endorses changes in his balance/less overall mobility and activities in  the past year.  He was previously a basketball and baseball referee; he does exercises at home from a previous bout of therapy.  His children are caregivers for his wife, who has medical issues as well.  He presents with decreased balance, decreased eccentric strength and control of movement patterns/grading of movements; abnormality of gait.  He is at increased fall risk per FGA score and has slight retropulsion with FTSTS test.  He will benefit from skilled PT to address the above stated deficits for improved functional mobility and decreased fall risk.    OBJECTIVE IMPAIRMENTS: Abnormal gait, decreased balance, decreased mobility, difficulty walking, and decreased strength.   ACTIVITY LIMITATIONS: transfers, locomotion level, and caring for others  PARTICIPATION LIMITATIONS: meal prep, cleaning, laundry, shopping, community activity, and yard work  PERSONAL FACTORS: 3+ comorbidities: see PMH above are also affecting patient's functional outcome.   REHAB POTENTIAL: Good  CLINICAL DECISION MAKING: Evolving/moderate complexity  EVALUATION COMPLEXITY: Moderate  PLAN:  PT FREQUENCY: 2x/week  PT DURATION: 6 weeks plus eval  PLANNED INTERVENTIONS: 97750- Physical Performance Testing, 97110-Therapeutic exercises, 97530- Therapeutic activity, 97112- Neuromuscular re-education, 97535- Self Care, [id number]- Manual therapy, [phone]- Gait training, Patient/Family  education, and Balance training  PLAN FOR NEXT SESSION: Initiate HEP for balance, transfers, functional strength, SLS and tandem stance, hip stability; fall prevention   Abner Ardis W., PT 11/18/2024, 3:01 PM   [facility] [address] [place], [place] [address] Phone # [phone] Fax # [phone]     Referring diagnosis:  R26.89 (ICD-10-CM) - Abnormality of gait due to impairment of balance Treatment diagnosis (if different than referring diagnosis): R26.81, R26.89 Date Symptoms Began: past year # of Visits requested: 13  Time period for Authorization: 11/18/2024 to 01/02/2025  What was this (referring dx) caused by? []  Surgery []  Fall [x]  Ongoing issue []  Arthritis []  Other: ____________  Laterality: []  Rt []  Lt [x]  Both  Functional Tool & Score: FGA 20/30 (<22/30 indicates increased fall risk); FTSTS 10 sec with retropulsion, SLS 1 sec each leg  Check all possible CPT codes:     See Planned Interventions listed in the Plan section of the Evaluation.     If [employer]: Choose 10 or less codes  If [employer]: Modalities are not covered  If [employer]: Check allowed ICD code combinations   If [facility] Plan or [employer]: Cognitive training not covered  "

## 2025-10-28 ENCOUNTER — Ambulatory Visit: Admitting: Adult Health
